# Patient Record
Sex: Female | Born: 1945 | Race: White | Hispanic: No | Marital: Married | Smoking: Never smoker
Health system: Southern US, Community
[De-identification: ages and names within clinical notes are randomized; demographics above are authoritative.]

## PROBLEM LIST (undated history)

## (undated) DIAGNOSIS — R002 Palpitations: Secondary | ICD-10-CM

## (undated) DIAGNOSIS — E559 Vitamin D deficiency, unspecified: Secondary | ICD-10-CM

## (undated) DIAGNOSIS — R32 Unspecified urinary incontinence: Secondary | ICD-10-CM

## (undated) DIAGNOSIS — I639 Cerebral infarction, unspecified: Secondary | ICD-10-CM

## (undated) DIAGNOSIS — M549 Dorsalgia, unspecified: Secondary | ICD-10-CM

## (undated) DIAGNOSIS — K760 Fatty (change of) liver, not elsewhere classified: Secondary | ICD-10-CM

## (undated) DIAGNOSIS — K5900 Constipation, unspecified: Secondary | ICD-10-CM

## (undated) DIAGNOSIS — G709 Myoneural disorder, unspecified: Secondary | ICD-10-CM

## (undated) DIAGNOSIS — R51 Headache: Secondary | ICD-10-CM

## (undated) DIAGNOSIS — F419 Anxiety disorder, unspecified: Secondary | ICD-10-CM

## (undated) DIAGNOSIS — M1990 Unspecified osteoarthritis, unspecified site: Secondary | ICD-10-CM

## (undated) DIAGNOSIS — C649 Malignant neoplasm of unspecified kidney, except renal pelvis: Secondary | ICD-10-CM

## (undated) DIAGNOSIS — K219 Gastro-esophageal reflux disease without esophagitis: Secondary | ICD-10-CM

## (undated) DIAGNOSIS — J189 Pneumonia, unspecified organism: Secondary | ICD-10-CM

## (undated) DIAGNOSIS — G459 Transient cerebral ischemic attack, unspecified: Secondary | ICD-10-CM

## (undated) DIAGNOSIS — Z5189 Encounter for other specified aftercare: Secondary | ICD-10-CM

## (undated) DIAGNOSIS — K589 Irritable bowel syndrome without diarrhea: Secondary | ICD-10-CM

## (undated) DIAGNOSIS — G4730 Sleep apnea, unspecified: Secondary | ICD-10-CM

## (undated) DIAGNOSIS — R011 Cardiac murmur, unspecified: Secondary | ICD-10-CM

## (undated) DIAGNOSIS — G2581 Restless legs syndrome: Secondary | ICD-10-CM

## (undated) DIAGNOSIS — IMO0002 Reserved for concepts with insufficient information to code with codable children: Secondary | ICD-10-CM

## (undated) DIAGNOSIS — K5790 Diverticulosis of intestine, part unspecified, without perforation or abscess without bleeding: Secondary | ICD-10-CM

## (undated) DIAGNOSIS — R945 Abnormal results of liver function studies: Secondary | ICD-10-CM

## (undated) DIAGNOSIS — R252 Cramp and spasm: Secondary | ICD-10-CM

## (undated) DIAGNOSIS — G43909 Migraine, unspecified, not intractable, without status migrainosus: Secondary | ICD-10-CM

## (undated) DIAGNOSIS — R7989 Other specified abnormal findings of blood chemistry: Secondary | ICD-10-CM

## (undated) DIAGNOSIS — I10 Essential (primary) hypertension: Secondary | ICD-10-CM

## (undated) DIAGNOSIS — I499 Cardiac arrhythmia, unspecified: Secondary | ICD-10-CM

## (undated) DIAGNOSIS — E669 Obesity, unspecified: Secondary | ICD-10-CM

## (undated) DIAGNOSIS — M2550 Pain in unspecified joint: Secondary | ICD-10-CM

## (undated) DIAGNOSIS — K635 Polyp of colon: Secondary | ICD-10-CM

## (undated) HISTORY — DX: Unspecified urinary incontinence: R32

## (undated) HISTORY — DX: Migraine, unspecified, not intractable, without status migrainosus: G43.909

## (undated) HISTORY — DX: Irritable bowel syndrome, unspecified: K58.9

## (undated) HISTORY — DX: Transient cerebral ischemic attack, unspecified: G45.9

## (undated) HISTORY — DX: Palpitations: R00.2

## (undated) HISTORY — DX: Cardiac murmur, unspecified: R01.1

## (undated) HISTORY — DX: Diverticulosis of intestine, part unspecified, without perforation or abscess without bleeding: K57.90

## (undated) HISTORY — DX: Myoneural disorder, unspecified: G70.9

## (undated) HISTORY — DX: Pain in unspecified joint: M25.50

## (undated) HISTORY — DX: Obesity, unspecified: E66.9

## (undated) HISTORY — DX: Unspecified osteoarthritis, unspecified site: M19.90

## (undated) HISTORY — DX: Gastro-esophageal reflux disease without esophagitis: K21.9

## (undated) HISTORY — DX: Cerebral infarction, unspecified: I63.9

## (undated) HISTORY — PX: ELBOW SURGERY: SHX618

## (undated) HISTORY — DX: Vitamin D deficiency, unspecified: E55.9

## (undated) HISTORY — DX: Cramp and spasm: R25.2

## (undated) HISTORY — DX: Constipation, unspecified: K59.00

## (undated) HISTORY — PX: TONSILLECTOMY: SHX5217

## (undated) HISTORY — DX: Reserved for concepts with insufficient information to code with codable children: IMO0002

## (undated) HISTORY — DX: Fatty (change of) liver, not elsewhere classified: K76.0

## (undated) HISTORY — DX: Dorsalgia, unspecified: M54.9

## (undated) HISTORY — DX: Encounter for other specified aftercare: Z51.89

## (undated) HISTORY — DX: Abnormal results of liver function studies: R94.5

## (undated) HISTORY — DX: Polyp of colon: K63.5

## (undated) HISTORY — DX: Other specified abnormal findings of blood chemistry: R79.89

## (undated) HISTORY — DX: Essential (primary) hypertension: I10

---

## 1977-07-08 HISTORY — PX: VAGINAL HYSTERECTOMY: SUR661

## 1989-07-08 HISTORY — PX: BREAST REDUCTION SURGERY: SHX8

## 1997-07-08 DIAGNOSIS — I499 Cardiac arrhythmia, unspecified: Secondary | ICD-10-CM

## 1997-07-08 HISTORY — DX: Cardiac arrhythmia, unspecified: I49.9

## 1998-01-26 ENCOUNTER — Ambulatory Visit (HOSPITAL_COMMUNITY): Admission: RE | Admit: 1998-01-26 | Discharge: 1998-01-26 | Payer: Self-pay | Admitting: Gastroenterology

## 1998-02-21 ENCOUNTER — Encounter (INDEPENDENT_AMBULATORY_CARE_PROVIDER_SITE_OTHER): Payer: Self-pay | Admitting: *Deleted

## 1998-02-21 ENCOUNTER — Other Ambulatory Visit: Admission: RE | Admit: 1998-02-21 | Discharge: 1998-02-21 | Payer: Self-pay | Admitting: Gastroenterology

## 1998-09-19 ENCOUNTER — Encounter: Admission: RE | Admit: 1998-09-19 | Discharge: 1998-12-18 | Payer: Self-pay | Admitting: Internal Medicine

## 1998-11-27 ENCOUNTER — Other Ambulatory Visit: Admission: RE | Admit: 1998-11-27 | Discharge: 1998-11-27 | Payer: Self-pay | Admitting: Obstetrics and Gynecology

## 2000-02-04 ENCOUNTER — Other Ambulatory Visit: Admission: RE | Admit: 2000-02-04 | Discharge: 2000-02-04 | Payer: Self-pay | Admitting: Internal Medicine

## 2001-10-26 ENCOUNTER — Encounter (INDEPENDENT_AMBULATORY_CARE_PROVIDER_SITE_OTHER): Payer: Self-pay | Admitting: *Deleted

## 2002-10-08 ENCOUNTER — Encounter (INDEPENDENT_AMBULATORY_CARE_PROVIDER_SITE_OTHER): Payer: Self-pay | Admitting: *Deleted

## 2002-10-26 ENCOUNTER — Encounter (INDEPENDENT_AMBULATORY_CARE_PROVIDER_SITE_OTHER): Payer: Self-pay | Admitting: Gastroenterology

## 2002-10-26 ENCOUNTER — Encounter (INDEPENDENT_AMBULATORY_CARE_PROVIDER_SITE_OTHER): Payer: Self-pay | Admitting: *Deleted

## 2002-10-26 ENCOUNTER — Encounter: Payer: Self-pay | Admitting: Internal Medicine

## 2003-01-20 ENCOUNTER — Encounter: Payer: Self-pay | Admitting: Family Medicine

## 2003-01-20 ENCOUNTER — Encounter: Admission: RE | Admit: 2003-01-20 | Discharge: 2003-01-20 | Payer: Self-pay | Admitting: Family Medicine

## 2003-03-25 ENCOUNTER — Encounter: Payer: Self-pay | Admitting: Internal Medicine

## 2003-03-25 ENCOUNTER — Encounter: Admission: RE | Admit: 2003-03-25 | Discharge: 2003-03-25 | Payer: Self-pay | Admitting: Internal Medicine

## 2003-05-23 ENCOUNTER — Encounter (INDEPENDENT_AMBULATORY_CARE_PROVIDER_SITE_OTHER): Payer: Self-pay | Admitting: *Deleted

## 2003-10-03 ENCOUNTER — Encounter (INDEPENDENT_AMBULATORY_CARE_PROVIDER_SITE_OTHER): Payer: Self-pay | Admitting: *Deleted

## 2004-04-24 ENCOUNTER — Ambulatory Visit: Payer: Self-pay | Admitting: Family Medicine

## 2004-05-18 ENCOUNTER — Ambulatory Visit: Payer: Self-pay | Admitting: Family Medicine

## 2004-07-06 ENCOUNTER — Other Ambulatory Visit: Admission: RE | Admit: 2004-07-06 | Discharge: 2004-07-06 | Payer: Self-pay | Admitting: Family Medicine

## 2004-07-06 ENCOUNTER — Ambulatory Visit: Payer: Self-pay | Admitting: Family Medicine

## 2004-07-23 ENCOUNTER — Ambulatory Visit: Payer: Self-pay | Admitting: Cardiovascular Disease

## 2004-08-15 ENCOUNTER — Ambulatory Visit: Payer: Self-pay | Admitting: Cardiovascular Disease

## 2004-10-31 ENCOUNTER — Encounter (INDEPENDENT_AMBULATORY_CARE_PROVIDER_SITE_OTHER): Payer: Self-pay | Admitting: *Deleted

## 2004-10-31 ENCOUNTER — Ambulatory Visit: Payer: Self-pay | Admitting: Gastroenterology

## 2005-02-06 ENCOUNTER — Ambulatory Visit: Payer: Self-pay | Admitting: Internal Medicine

## 2005-05-03 ENCOUNTER — Ambulatory Visit: Payer: Self-pay | Admitting: Internal Medicine

## 2005-05-09 ENCOUNTER — Ambulatory Visit: Payer: Self-pay | Admitting: Internal Medicine

## 2005-07-08 DIAGNOSIS — J189 Pneumonia, unspecified organism: Secondary | ICD-10-CM

## 2005-07-08 HISTORY — DX: Pneumonia, unspecified organism: J18.9

## 2005-08-28 ENCOUNTER — Ambulatory Visit: Payer: Self-pay | Admitting: Internal Medicine

## 2005-09-02 ENCOUNTER — Ambulatory Visit: Payer: Self-pay | Admitting: Internal Medicine

## 2005-09-03 ENCOUNTER — Emergency Department (HOSPITAL_COMMUNITY): Admission: EM | Admit: 2005-09-03 | Discharge: 2005-09-03 | Payer: Self-pay | Admitting: Emergency Medicine

## 2005-09-04 ENCOUNTER — Ambulatory Visit: Payer: Self-pay | Admitting: Internal Medicine

## 2005-09-04 ENCOUNTER — Inpatient Hospital Stay (HOSPITAL_COMMUNITY): Admission: AD | Admit: 2005-09-04 | Discharge: 2005-09-06 | Payer: Self-pay | Admitting: Internal Medicine

## 2005-09-10 ENCOUNTER — Ambulatory Visit: Payer: Self-pay | Admitting: Internal Medicine

## 2005-09-24 ENCOUNTER — Ambulatory Visit: Payer: Self-pay | Admitting: Internal Medicine

## 2005-09-25 ENCOUNTER — Encounter (INDEPENDENT_AMBULATORY_CARE_PROVIDER_SITE_OTHER): Payer: Self-pay | Admitting: *Deleted

## 2005-09-25 ENCOUNTER — Ambulatory Visit: Payer: Self-pay | Admitting: Gastroenterology

## 2005-10-18 ENCOUNTER — Ambulatory Visit: Payer: Self-pay | Admitting: Endocrinology

## 2005-12-25 ENCOUNTER — Ambulatory Visit: Payer: Self-pay | Admitting: Internal Medicine

## 2005-12-30 ENCOUNTER — Ambulatory Visit: Payer: Self-pay | Admitting: Cardiovascular Disease

## 2006-01-01 ENCOUNTER — Ambulatory Visit: Payer: Self-pay | Admitting: Internal Medicine

## 2006-04-25 ENCOUNTER — Ambulatory Visit: Payer: Self-pay | Admitting: Internal Medicine

## 2006-04-28 LAB — HM MAMMOGRAPHY: HM Mammogram: NORMAL

## 2006-06-18 ENCOUNTER — Ambulatory Visit: Payer: Self-pay | Admitting: Internal Medicine

## 2006-09-24 ENCOUNTER — Ambulatory Visit: Payer: Self-pay | Admitting: Gastroenterology

## 2006-10-16 ENCOUNTER — Ambulatory Visit (HOSPITAL_COMMUNITY): Admission: RE | Admit: 2006-10-16 | Discharge: 2006-10-16 | Payer: Self-pay | Admitting: Gastroenterology

## 2006-10-16 ENCOUNTER — Encounter (INDEPENDENT_AMBULATORY_CARE_PROVIDER_SITE_OTHER): Payer: Self-pay | Admitting: Gastroenterology

## 2006-10-16 LAB — HM COLONOSCOPY: HM Colonoscopy: NORMAL

## 2006-12-17 ENCOUNTER — Ambulatory Visit: Payer: Self-pay | Admitting: Internal Medicine

## 2006-12-24 ENCOUNTER — Other Ambulatory Visit: Admission: RE | Admit: 2006-12-24 | Discharge: 2006-12-24 | Payer: Self-pay | Admitting: Gynecology

## 2006-12-31 ENCOUNTER — Ambulatory Visit: Payer: Self-pay | Admitting: Gastroenterology

## 2007-01-28 ENCOUNTER — Ambulatory Visit: Payer: Self-pay | Admitting: Internal Medicine

## 2007-01-28 LAB — CONVERTED CEMR LAB
AST: 34 units/L (ref 0–37)
Bilirubin, Direct: 0.1 mg/dL (ref 0.0–0.3)
CO2: 30 meq/L (ref 19–32)
Chloride: 105 meq/L (ref 96–112)
Creatinine, Ser: 0.6 mg/dL (ref 0.4–1.2)
Eosinophils Absolute: 0.1 10*3/uL (ref 0.0–0.6)
Eosinophils Relative: 0.9 % (ref 0.0–5.0)
GFR calc non Af Amer: 109 mL/min
Glucose, Bld: 95 mg/dL (ref 70–99)
HCT: 34.1 % — ABNORMAL LOW (ref 36.0–46.0)
MCV: 86.9 fL (ref 78.0–100.0)
Neutrophils Relative %: 68.6 % (ref 43.0–77.0)
Pro B Natriuretic peptide (BNP): 62 pg/mL (ref 0.0–100.0)
RBC: 3.93 M/uL (ref 3.87–5.11)
Sodium: 142 meq/L (ref 135–145)
Total Bilirubin: 0.5 mg/dL (ref 0.3–1.2)
Total Protein: 6.4 g/dL (ref 6.0–8.3)
WBC: 7.6 10*3/uL (ref 4.5–10.5)

## 2007-01-29 ENCOUNTER — Ambulatory Visit: Payer: Self-pay | Admitting: Cardiology

## 2007-01-29 ENCOUNTER — Ambulatory Visit: Payer: Self-pay | Admitting: Critical Care Medicine

## 2007-02-02 DIAGNOSIS — N6009 Solitary cyst of unspecified breast: Secondary | ICD-10-CM

## 2007-02-02 DIAGNOSIS — I059 Rheumatic mitral valve disease, unspecified: Secondary | ICD-10-CM | POA: Insufficient documentation

## 2007-02-02 DIAGNOSIS — K589 Irritable bowel syndrome without diarrhea: Secondary | ICD-10-CM

## 2007-02-02 DIAGNOSIS — G2581 Restless legs syndrome: Secondary | ICD-10-CM | POA: Insufficient documentation

## 2007-02-02 DIAGNOSIS — K219 Gastro-esophageal reflux disease without esophagitis: Secondary | ICD-10-CM

## 2007-02-02 DIAGNOSIS — I10 Essential (primary) hypertension: Secondary | ICD-10-CM | POA: Insufficient documentation

## 2007-02-02 DIAGNOSIS — Z87898 Personal history of other specified conditions: Secondary | ICD-10-CM

## 2007-02-05 ENCOUNTER — Ambulatory Visit: Payer: Self-pay

## 2007-02-05 ENCOUNTER — Encounter: Payer: Self-pay | Admitting: Internal Medicine

## 2007-04-01 ENCOUNTER — Encounter: Payer: Self-pay | Admitting: Internal Medicine

## 2007-04-01 ENCOUNTER — Ambulatory Visit: Payer: Self-pay | Admitting: Internal Medicine

## 2007-04-01 DIAGNOSIS — Z9189 Other specified personal risk factors, not elsewhere classified: Secondary | ICD-10-CM | POA: Insufficient documentation

## 2007-04-01 DIAGNOSIS — J383 Other diseases of vocal cords: Secondary | ICD-10-CM | POA: Insufficient documentation

## 2007-04-01 DIAGNOSIS — E669 Obesity, unspecified: Secondary | ICD-10-CM | POA: Insufficient documentation

## 2007-06-29 ENCOUNTER — Telehealth (INDEPENDENT_AMBULATORY_CARE_PROVIDER_SITE_OTHER): Payer: Self-pay | Admitting: *Deleted

## 2007-07-01 ENCOUNTER — Ambulatory Visit: Payer: Self-pay | Admitting: Internal Medicine

## 2007-07-01 DIAGNOSIS — J209 Acute bronchitis, unspecified: Secondary | ICD-10-CM | POA: Insufficient documentation

## 2007-09-25 ENCOUNTER — Ambulatory Visit: Payer: Self-pay | Admitting: Internal Medicine

## 2007-09-28 ENCOUNTER — Ambulatory Visit: Payer: Self-pay | Admitting: Internal Medicine

## 2007-09-28 DIAGNOSIS — R252 Cramp and spasm: Secondary | ICD-10-CM | POA: Insufficient documentation

## 2007-10-12 ENCOUNTER — Telehealth: Payer: Self-pay | Admitting: Internal Medicine

## 2007-10-30 ENCOUNTER — Ambulatory Visit: Payer: Self-pay | Admitting: Internal Medicine

## 2007-11-28 ENCOUNTER — Emergency Department (HOSPITAL_COMMUNITY): Admission: EM | Admit: 2007-11-28 | Discharge: 2007-11-28 | Payer: Self-pay | Admitting: Emergency Medicine

## 2007-11-28 ENCOUNTER — Telehealth: Payer: Self-pay | Admitting: Family Medicine

## 2007-12-25 ENCOUNTER — Ambulatory Visit: Payer: Self-pay | Admitting: Internal Medicine

## 2007-12-26 LAB — CONVERTED CEMR LAB
BUN: 13 mg/dL (ref 6–23)
Calcium: 9.3 mg/dL (ref 8.4–10.5)
Creatinine, Ser: 0.7 mg/dL (ref 0.4–1.2)
GFR calc Af Amer: 110 mL/min
Glucose, Bld: 109 mg/dL — ABNORMAL HIGH (ref 70–99)

## 2008-01-01 ENCOUNTER — Ambulatory Visit: Payer: Self-pay | Admitting: Internal Medicine

## 2008-01-01 DIAGNOSIS — R21 Rash and other nonspecific skin eruption: Secondary | ICD-10-CM

## 2008-01-01 DIAGNOSIS — R079 Chest pain, unspecified: Secondary | ICD-10-CM | POA: Insufficient documentation

## 2008-01-15 ENCOUNTER — Telehealth: Payer: Self-pay | Admitting: Internal Medicine

## 2008-04-22 ENCOUNTER — Ambulatory Visit: Payer: Self-pay | Admitting: Internal Medicine

## 2008-04-25 ENCOUNTER — Ambulatory Visit: Payer: Self-pay | Admitting: Internal Medicine

## 2008-04-28 ENCOUNTER — Ambulatory Visit: Payer: Self-pay | Admitting: Critical Care Medicine

## 2008-04-28 DIAGNOSIS — J984 Other disorders of lung: Secondary | ICD-10-CM | POA: Insufficient documentation

## 2008-04-28 DIAGNOSIS — J309 Allergic rhinitis, unspecified: Secondary | ICD-10-CM | POA: Insufficient documentation

## 2008-07-04 ENCOUNTER — Ambulatory Visit: Payer: Self-pay | Admitting: Internal Medicine

## 2008-07-07 LAB — CONVERTED CEMR LAB
ALT: 32 units/L (ref 0–35)
BUN: 14 mg/dL (ref 6–23)
Basophils Absolute: 0 10*3/uL (ref 0.0–0.1)
Bilirubin, Direct: 0.1 mg/dL (ref 0.0–0.3)
Calcium: 9.1 mg/dL (ref 8.4–10.5)
Chloride: 107 meq/L (ref 96–112)
Eosinophils Absolute: 0.1 10*3/uL (ref 0.0–0.7)
Glucose, Bld: 112 mg/dL — ABNORMAL HIGH (ref 70–99)
HCT: 37.7 % (ref 36.0–46.0)
MCHC: 34.9 g/dL (ref 30.0–36.0)
MCV: 91.8 fL (ref 78.0–100.0)
Monocytes Absolute: 0.3 10*3/uL (ref 0.1–1.0)
Neutrophils Relative %: 68.7 % (ref 43.0–77.0)
Platelets: 207 10*3/uL (ref 150–400)
Potassium: 4.3 meq/L (ref 3.5–5.1)
Total Protein: 6.4 g/dL (ref 6.0–8.3)

## 2008-07-12 ENCOUNTER — Ambulatory Visit: Payer: Self-pay | Admitting: Internal Medicine

## 2008-07-22 ENCOUNTER — Telehealth: Payer: Self-pay | Admitting: Internal Medicine

## 2008-07-27 ENCOUNTER — Telehealth: Payer: Self-pay | Admitting: Internal Medicine

## 2008-09-20 DIAGNOSIS — D126 Benign neoplasm of colon, unspecified: Secondary | ICD-10-CM | POA: Insufficient documentation

## 2008-09-21 ENCOUNTER — Ambulatory Visit: Payer: Self-pay | Admitting: Internal Medicine

## 2008-09-21 DIAGNOSIS — Z860100 Personal history of colon polyps, unspecified: Secondary | ICD-10-CM | POA: Insufficient documentation

## 2008-10-05 ENCOUNTER — Telehealth: Payer: Self-pay | Admitting: Internal Medicine

## 2008-10-13 ENCOUNTER — Encounter: Payer: Self-pay | Admitting: Internal Medicine

## 2008-10-27 ENCOUNTER — Ambulatory Visit: Payer: Self-pay | Admitting: Internal Medicine

## 2008-10-27 ENCOUNTER — Telehealth: Payer: Self-pay | Admitting: Internal Medicine

## 2008-10-28 ENCOUNTER — Telehealth: Payer: Self-pay | Admitting: Adult Health

## 2008-11-09 ENCOUNTER — Encounter: Payer: Self-pay | Admitting: Internal Medicine

## 2008-11-23 ENCOUNTER — Encounter: Payer: Self-pay | Admitting: Internal Medicine

## 2009-01-10 ENCOUNTER — Telehealth: Payer: Self-pay | Admitting: Internal Medicine

## 2009-01-13 ENCOUNTER — Ambulatory Visit: Payer: Self-pay | Admitting: Internal Medicine

## 2009-01-13 LAB — CONVERTED CEMR LAB
BUN: 17 mg/dL (ref 6–23)
Calcium: 9.1 mg/dL (ref 8.4–10.5)
GFR calc non Af Amer: 90.41 mL/min (ref >60)
Glucose, Bld: 111 mg/dL — ABNORMAL HIGH (ref 70–99)
Sodium: 141 meq/L (ref 135–145)

## 2009-01-16 ENCOUNTER — Ambulatory Visit: Payer: Self-pay | Admitting: Internal Medicine

## 2009-01-16 DIAGNOSIS — R5383 Other fatigue: Secondary | ICD-10-CM

## 2009-01-18 ENCOUNTER — Encounter: Payer: Self-pay | Admitting: Internal Medicine

## 2009-01-23 ENCOUNTER — Encounter: Payer: Self-pay | Admitting: Internal Medicine

## 2009-03-02 ENCOUNTER — Encounter: Payer: Self-pay | Admitting: Internal Medicine

## 2009-05-12 ENCOUNTER — Ambulatory Visit: Payer: Self-pay | Admitting: Internal Medicine

## 2009-05-12 LAB — CONVERTED CEMR LAB
BUN: 12 mg/dL (ref 6–23)
Basophils Absolute: 0.1 10*3/uL (ref 0.0–0.1)
Bilirubin Urine: NEGATIVE
Cholesterol: 180 mg/dL (ref 0–200)
Creatinine, Ser: 0.8 mg/dL (ref 0.4–1.2)
Eosinophils Absolute: 0.1 10*3/uL (ref 0.0–0.7)
GFR calc non Af Amer: 77.41 mL/min (ref >60)
Ketones, ur: NEGATIVE mg/dL
LDL Cholesterol: 89 mg/dL (ref 0–99)
Leukocytes, UA: NEGATIVE
Lymphocytes Relative: 22.2 % (ref 12.0–46.0)
MCHC: 34.3 g/dL (ref 30.0–36.0)
Neutro Abs: 3.5 10*3/uL (ref 1.4–7.7)
Neutrophils Relative %: 68.4 % (ref 43.0–77.0)
RDW: 12 % (ref 11.5–14.6)
Sed Rate: 21 mm/hr (ref 0–22)
Total Bilirubin: 0.7 mg/dL (ref 0.3–1.2)
Triglycerides: 124 mg/dL (ref 0.0–149.0)
VLDL: 24.8 mg/dL (ref 0.0–40.0)
Vitamin B-12: 602 pg/mL (ref 211–911)
pH: 6 (ref 5.0–8.0)

## 2009-05-17 ENCOUNTER — Ambulatory Visit: Payer: Self-pay | Admitting: Internal Medicine

## 2009-05-17 DIAGNOSIS — R05 Cough: Secondary | ICD-10-CM

## 2009-08-18 ENCOUNTER — Ambulatory Visit: Payer: Self-pay | Admitting: Internal Medicine

## 2009-08-18 LAB — CONVERTED CEMR LAB
ALT: 64 units/L — ABNORMAL HIGH (ref 0–35)
Albumin: 4 g/dL (ref 3.5–5.2)
BUN: 15 mg/dL (ref 6–23)
Chloride: 104 meq/L (ref 96–112)
Creatinine, Ser: 0.8 mg/dL (ref 0.4–1.2)
Glucose, Bld: 104 mg/dL — ABNORMAL HIGH (ref 70–99)
Total Bilirubin: 0.5 mg/dL (ref 0.3–1.2)
Total Protein: 6.8 g/dL (ref 6.0–8.3)

## 2009-08-23 ENCOUNTER — Ambulatory Visit: Payer: Self-pay | Admitting: Internal Medicine

## 2009-08-23 DIAGNOSIS — R7309 Other abnormal glucose: Secondary | ICD-10-CM | POA: Insufficient documentation

## 2009-08-23 DIAGNOSIS — Z8639 Personal history of other endocrine, nutritional and metabolic disease: Secondary | ICD-10-CM

## 2009-08-23 DIAGNOSIS — Z862 Personal history of diseases of the blood and blood-forming organs and certain disorders involving the immune mechanism: Secondary | ICD-10-CM | POA: Insufficient documentation

## 2009-08-25 ENCOUNTER — Telehealth: Payer: Self-pay | Admitting: Internal Medicine

## 2009-10-04 ENCOUNTER — Ambulatory Visit: Payer: Self-pay | Admitting: Internal Medicine

## 2009-10-04 ENCOUNTER — Telehealth: Payer: Self-pay | Admitting: Internal Medicine

## 2009-10-16 ENCOUNTER — Encounter: Payer: Self-pay | Admitting: Internal Medicine

## 2009-10-19 ENCOUNTER — Telehealth: Payer: Self-pay | Admitting: Internal Medicine

## 2009-10-25 ENCOUNTER — Encounter: Payer: Self-pay | Admitting: Internal Medicine

## 2010-01-18 ENCOUNTER — Ambulatory Visit: Payer: Self-pay | Admitting: Internal Medicine

## 2010-01-18 DIAGNOSIS — R42 Dizziness and giddiness: Secondary | ICD-10-CM | POA: Insufficient documentation

## 2010-01-18 DIAGNOSIS — J069 Acute upper respiratory infection, unspecified: Secondary | ICD-10-CM | POA: Insufficient documentation

## 2010-02-15 ENCOUNTER — Ambulatory Visit: Payer: Self-pay | Admitting: Internal Medicine

## 2010-02-15 LAB — CONVERTED CEMR LAB
ALT: 71 units/L — ABNORMAL HIGH (ref 0–35)
BUN: 12 mg/dL (ref 6–23)
Bilirubin, Direct: 0.2 mg/dL (ref 0.0–0.3)
CO2: 31 meq/L (ref 19–32)
Chloride: 103 meq/L (ref 96–112)
Creatinine, Ser: 0.8 mg/dL (ref 0.4–1.2)
Total Protein: 7 g/dL (ref 6.0–8.3)

## 2010-02-21 ENCOUNTER — Ambulatory Visit: Payer: Self-pay | Admitting: Internal Medicine

## 2010-02-23 ENCOUNTER — Encounter: Admission: RE | Admit: 2010-02-23 | Discharge: 2010-02-23 | Payer: Self-pay | Admitting: Internal Medicine

## 2010-05-15 ENCOUNTER — Encounter: Payer: Self-pay | Admitting: Internal Medicine

## 2010-07-30 ENCOUNTER — Telehealth: Payer: Self-pay | Admitting: Internal Medicine

## 2010-08-07 NOTE — Medication Information (Signed)
Summary: Lansoprazole/[facility]  Lansoprazole/[facility]   Imported By: Phillis Knack 10/30/2009 07:55:15  _____________________________________________________________________  External Attachment:    Type:   Image     Comment:   External Document

## 2010-08-07 NOTE — Assessment & Plan Note (Signed)
Summary: VERTIGO/[facility]   Vital Signs:  Patient profile:   65 year old female Height:      64 inches Weight:      218 pounds BMI:     37.55 O2 Sat:      96 % on Room air Temp:     97.7 degrees F oral Pulse rate:   65 / minute BP sitting:   112 / 80  (left arm) Cuff size:   large  Vitals Entered By: Shirlean Mylar Ewing CMA Deborra Medina) (January 18, 2010 1:49 PM)  O2 Flow:  Room air CC: Vertigo/RE   Primary Care Provider:  Walker Kehr, MD  CC:  Vertigo/RE.  History of Present Illness: here after URI symtpoms begiinning the end of last wk, somewhat better with antihistamine,  but over the past weekend (3 days ago) with onset positional dizziness, and even fell over a chair without injury;  the last 2 to 3 days with persistent headache, ear fullness, and vertigo without nausea, though has less appetitie,  no fever, wt loss, but has had some coldness today and feels slike se is "freezing."  Has had some sinus drainage , no ocugh, but has dry throat with the antihistamines.  Has had childhood singifcant  sinus allergies, but symptoms not nearly as severe in recent years - maybe a day or two a year when the pollen is worse.  She had the grandkids for several days at the onset of her symptoms;  Pt denies CP, worsening sob, doe, wheezing, orthopnea, pnd, worsening LE edema, palps, dizziness or syncope  Pt denies new neuro symptoms such as headache, facial or extremity weakness   Has hx of asthma with DOE with going up and down stairs only.  No fever, wt loss, night sweats, or other constitutional symptoms No signficant falls or other injury.    Problems Prior to Update: 1)  Liver Function Tests, Abnormal, Hx of  (ICD-V12.2) 2)  Hyperglycemia  (ICD-790.29) 3)  Cough  (ICD-786.2) 4)  Fatigue  (ICD-780.79) 5)  Personal Hx Colonic Polyps  (ICD-V12.72) 6)  Adenomatous Colonic Polyp  (ICD-211.3) 7)  Restrictive Lung Disease  (ICD-518.89) 8)  Allergic Rhinitis  (ICD-477.9) 9)  Rash and Other Nonspecific Skin  Eruption  (ICD-782.1) 10)  Chest Pain, Unspecified  (ICD-786.50) 11)  Sinusitis, Acute  (ICD-461.9) 12)  Cramps,leg  (ICD-729.82) 13)  Bronchitis, Acute  (ICD-466.0) 14)  Hx of Reduction Mammoplasty, Hx of  (ICD-V15.9) 15)  Irritable Bowel Syndrome  (ICD-564.1) 16)  Obesity  (ICD-278.00) 17)  Vocal Cord Disorder  (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 Two Times A Day 2)  Librax 2.5-5 Mg  Caps (Clidinium-Chlordiazepoxide) .... Take 1 Tablet By Mouth As Needed Bid 3)  Clonazepam 1 Mg  Tabs (Clonazepam) .... Take 1-2 Tablets By Mouth At Bedtime 4)  Skelaxin 800 Mg  Tabs (Metaxalone) .Marland Kitchen.. 1 By Mouth Two Times A Day 5)  Toprol Xl 100 Mg  Tb24 (Metoprolol Succinate) .... Take 1 Tablet By Mouth Once A Day 6)  Benicar 40 Mg  Tabs (Olmesartan Medoxomil) .Marland Kitchen.. 1 By Mouth Once Daily 7)  Premarin 0.625 Mg Tabs (Estrogens Conjugated) .... One By Mouth Daily/alternates With 0.3 8)  Spironolactone 50 Mg  Tabs (Spironolactone) .... Take 1 Tablet By Mouth Once A Day 9)  Vitamin D3 1000 Unit  Tabs (Cholecalciferol) .Marland Kitchen.. 1 By Mouth Daily 10)  Proair Hfa 108 (90 Base) Mcg/act  Aers (Albuterol Sulfate) .... 2 Inh Q4h As Needed Shortness of Breath 11)  Premarin 0.3 Mg Tabs (Estrogens Conjugated) .Marland Kitchen.. 1 By Mouth Once Daily/alternates With The 0.625  Current Medications (verified): 1)  Prevacid 30 Mg  Cpdr (Lansoprazole) .... Two Times A Day 2)  Librax 2.5-5 Mg  Caps (Clidinium-Chlordiazepoxide) .... Take 1 Tablet By Mouth As Needed Bid 3)  Clonazepam 1 Mg  Tabs (Clonazepam) .... Take 1-2 Tablets By Mouth At Bedtime 4)  Skelaxin 800 Mg  Tabs (Metaxalone) .Marland Kitchen.. 1 By Mouth Two Times A Day 5)  Toprol Xl 100 Mg  Tb24 (Metoprolol Succinate) ....  Take 1 Tablet By Mouth Once A Day 6)  Benicar 40 Mg  Tabs (Olmesartan Medoxomil) .Marland Kitchen.. 1 By Mouth Once Daily 7)  Premarin 0.625 Mg Tabs (Estrogens Conjugated) .... One By Mouth Daily/alternates With 0.3 8)  Spironolactone 50 Mg  Tabs (Spironolactone) .... Take 1 Tablet By Mouth Once A Day 9)  Vitamin D3 1000 Unit  Tabs (Cholecalciferol) .Marland Kitchen.. 1 By Mouth Daily 10)  Proair Hfa 108 (90 Base) Mcg/act  Aers (Albuterol Sulfate) .... 2 Inh Q4h As Needed Shortness of Breath 11)  Premarin 0.3 Mg Tabs (Estrogens Conjugated) .Marland Kitchen.. 1 By Mouth Once Daily/alternates With The 0.625  Allergies (verified): 1)  ! Avelox Abc Pack (Moxifloxacin Hcl) 2)  Asa 3)  Diflucan (Fluconazole) (Fluconazole) 4)  Hydrocodone  Past History:  Past Surgical History: Last updated: 09/21/2008 Hysterectomy Tonsillectomy Breast Reduction  Social History: Last updated: 10/04/2009 Married Patient is a former smoker. -stopped 40 years ago Alcohol Use - yes-2 glasses occasionally Illicit Drug Use - no Patient does not get regular exercise.   Risk Factors: Exercise: no (09/21/2008)  Risk Factors: Smoking Status: never (08/23/2009)  Past Medical History: Asthma GERD Hypertension Obesity Allergic rhinitis  Review of Systems       all otherwise negative per pt -    Physical Exam  General:  alert and overweight-appearing.  , mild ill  Head:  normocephalic and atraumatic.   Eyes:  vision grossly intact, pupils equal, and pupils round.   Ears:  R ear normal.  , left tm with mod erythema, midl bulging Nose:  nasal dischargemucosal pallor and mucosal edema.   Mouth:  pharyngeal erythema and fair dentition.   Neck:  supple and no masses.   Lungs:  normal respiratory effort and normal breath sounds.   Heart:  normal rate and regular rhythm.   Extremities:  no edema, no erythema  Neurologic:  cranial nerves II-XII intact, strength normal in all extremities, and finger-to-nose normal.   Skin:  no rashes.   Psych:   slightly anxious.     Impression & Recommendations:  Problem # 1:  URI (ICD-465.9)  with left otitits media - ok for zpack x 1  Her updated medication list for this problem includes:    Levocetirizine Dihydrochloride 5 Mg Tabs (Levocetirizine dihydrochloride) .Marland Kitchen... 1po once daily  Problem # 2:  ALLERGIC RHINITIS (ICD-477.9)  has antihist and steroid nasal spray at home but not sure of names - will provide generic xyzal and flonase just in case  Her updated medication list for this problem includes:    Levocetirizine Dihydrochloride 5 Mg Tabs (Levocetirizine dihydrochloride) .Marland Kitchen... 1po once daily    Fluticasone Propionate 50 Mcg/act Susp (Fluticasone propionate) .Marland KitchenMarland KitchenMarland KitchenMarland Kitchen  2 spray/side once daily  Problem # 3:  INTERMITTENT VERTIGO (ICD-780.4) Assessment: New  most likely due to #1 - ok for meclizine as needed   Her updated medication list for this problem includes:    Levocetirizine Dihydrochloride 5 Mg Tabs (Levocetirizine dihydrochloride) .Marland Kitchen... 1po once daily    Meclizine Hcl 12.5 Mg Tabs (Meclizine hcl) .Marland Kitchen... 1-2 by mouth q 6hrs as needed dizziness  Problem # 4:  HYPERTENSION (ICD-401.9)  Her updated medication list for this problem includes:    Toprol Xl 100 Mg Tb24 (Metoprolol succinate) .Marland Kitchen... Take 1 tablet by mouth once a day    Benicar 40 Mg Tabs (Olmesartan medoxomil) .Marland Kitchen... 1 by mouth once daily    Spironolactone 50 Mg Tabs (Spironolactone) .Marland Kitchen... Take 1 tablet by mouth once a day  BP today: 112/80 Prior BP: 104/70 (10/04/2009)  Labs Reviewed: K+: 4.4 (08/18/2009) Creat: : 0.8 (08/18/2009)   Chol: 180 (05/12/2009)   HDL: 66.10 (05/12/2009)   LDL: 89 (05/12/2009)   TG: 124.0 (05/12/2009) stable overall by hx and exam, ok to continue meds/tx as is   Complete Medication List: 1)  Prevacid 30 Mg Cpdr (Lansoprazole) .... Two times a day 2)  Librax 2.5-5 Mg Caps (Clidinium-chlordiazepoxide) .... Take 1 tablet by mouth as needed bid 3)  Clonazepam 1 Mg Tabs (Clonazepam) ....  Take 1-2 tablets by mouth at bedtime 4)  Skelaxin 800 Mg Tabs (Metaxalone) .Marland Kitchen.. 1 by mouth two times a day 5)  Toprol Xl 100 Mg Tb24 (Metoprolol succinate) .... Take 1 tablet by mouth once a day 6)  Benicar 40 Mg Tabs (Olmesartan medoxomil) .Marland Kitchen.. 1 by mouth once daily 7)  Premarin 0.625 Mg Tabs (Estrogens conjugated) .... One by mouth daily/alternates with 0.3 8)  Spironolactone 50 Mg Tabs (Spironolactone) .... Take 1 tablet by mouth once a day 9)  Vitamin D3 1000 Unit Tabs (Cholecalciferol) .Marland Kitchen.. 1 by mouth daily 10)  Proair Hfa 108 (90 Base) Mcg/act Aers (Albuterol sulfate) .... 2 inh q4h as needed shortness of breath 11)  Premarin 0.3 Mg Tabs (Estrogens conjugated) .Marland Kitchen.. 1 by mouth once daily/alternates with the 0.625 12)  Azithromycin 250 Mg Tabs (Azithromycin) .... 2po qd for 1 day, then 1po qd for 4days, then stop 13)  Levocetirizine Dihydrochloride 5 Mg Tabs (Levocetirizine dihydrochloride) .Marland Kitchen.. 1po once daily 14)  Fluticasone Propionate 50 Mcg/act Susp (Fluticasone propionate) .... 2 spray/side once daily 15)  Meclizine Hcl 12.5 Mg Tabs (Meclizine hcl) .Marland Kitchen.. 1-2 by mouth q 6hrs as needed dizziness  Patient Instructions: 1)  Please take all new medications as prescribed - the antibiotic (azithromycin) and the meclizine as needed dizziness 2)  you are given the prescription for generic xzyal (antihist) and flonase (lfuticasone) in case you don't have these at home 3)  You can also use Mucinex OTC or it's generic for congestion 4)  Continue all previous medications as before this visit  5)  Please schedule an appointment with your primary doctor as needed Prescriptions: MECLIZINE HCL 12.5 MG TABS (MECLIZINE HCL) 1-2 by mouth q 6hrs as needed dizziness  #40 x 1   Entered and Authorized by:   Biagio Borg MD   Signed by:   Biagio Borg MD on 01/18/2010   Method used:   Print then Give to Patient   RxID:   [id number] FLUTICASONE PROPIONATE 50 MCG/ACT SUSP (FLUTICASONE PROPIONATE) 2  spray/side once daily  #1 x 11   Entered and Authorized by:   Biagio Borg MD   Signed  by:   Biagio Borg MD on 01/18/2010   Method used:   Print then Give to Patient   RxID:   [id number] LEVOCETIRIZINE DIHYDROCHLORIDE 5 MG TABS (LEVOCETIRIZINE DIHYDROCHLORIDE) 1po once daily  #30 x 11   Entered and Authorized by:   Biagio Borg MD   Signed by:   Biagio Borg MD on 01/18/2010   Method used:   Print then Give to Patient   RxID:   [id number] AZITHROMYCIN 250 MG TABS (AZITHROMYCIN) 2po qd for 1 day, then 1po qd for 4days, then stop  #6 x 1   Entered and Authorized by:   Biagio Borg MD   Signed by:   Biagio Borg MD on 01/18/2010   Method used:   Print then Give to Patient   RxID:   [id number]

## 2010-08-07 NOTE — Assessment & Plan Note (Signed)
Summary: 6 mth fu  stc   Vital Signs:  Patient profile:   65 year old female Height:      64 inches Weight:      221 pounds BMI:     38.07 O2 Sat:      95 % on Room air Temp:     98.5 degrees F oral Pulse rate:   75 / minute Pulse rhythm:   regular Resp:     16 per minute BP sitting:   120 / 86  (left arm) Cuff size:   large  Vitals Entered By: Jonathon Resides, CMA(AAMA) (February 21, 2010 1:23 PM)  O2 Flow:  Room air CC: 6 mo f/u Is Patient Diabetic? No Comments pt is not taking Levocetrizine Dihydro.  Please remove from list   Primary Care Provider:  Walker Kehr, MD  CC:  6 mo f/u.  History of Present Illness: C/o dizziness x 6 wks off and on F/u HTN, GERD, allergies  Current Medications (verified): 1)  Prevacid 30 Mg  Cpdr (Lansoprazole) .... Two Times A Day 2)  Librax 2.5-5 Mg  Caps (Clidinium-Chlordiazepoxide) .... Take 1 Tablet By Mouth As Needed Bid 3)  Clonazepam 1 Mg  Tabs (Clonazepam) .... Take 1-2 Tablets By Mouth At Bedtime 4)  Skelaxin 800 Mg  Tabs (Metaxalone) .Marland Kitchen.. 1 By Mouth Two Times A Day 5)  Toprol Xl 100 Mg  Tb24 (Metoprolol Succinate) .... Take 1 Tablet By Mouth Once A Day 6)  Benicar 40 Mg  Tabs (Olmesartan Medoxomil) .Marland Kitchen.. 1 By Mouth Once Daily 7)  Premarin 0.625 Mg Tabs (Estrogens Conjugated) .... One By Mouth Daily/alternates With 0.3 8)  Spironolactone 50 Mg  Tabs (Spironolactone) .... Take 1 Tablet By Mouth Once A Day 9)  Vitamin D3 1000 Unit  Tabs (Cholecalciferol) .Marland Kitchen.. 1 By Mouth Daily 10)  Proair Hfa 108 (90 Base) Mcg/act  Aers (Albuterol Sulfate) .... 2 Inh Q4h As Needed Shortness of Breath 11)  Premarin 0.3 Mg Tabs (Estrogens Conjugated) .Marland Kitchen.. 1 By Mouth Once Daily/alternates With The 0.625 12)  Levocetirizine Dihydrochloride 5 Mg Tabs (Levocetirizine Dihydrochloride) .Marland Kitchen.. 1po Once Daily 13)  Fluticasone Propionate 50 Mcg/act Susp (Fluticasone Propionate) .... 2 Spray/side Once Daily 14)  Meclizine Hcl 12.5 Mg Tabs (Meclizine Hcl) .Marland Kitchen.. 1-2  By Mouth Q 6hrs As Needed Dizziness  Allergies (verified): 1)  ! Avelox Abc Pack (Moxifloxacin Hcl) 2)  Asa 3)  Diflucan (Fluconazole) (Fluconazole) 4)  Hydrocodone  Past History:  Past Medical History: Last updated: 01/18/2010 Asthma GERD Hypertension Obesity Allergic rhinitis  Social History: Last updated: 10/04/2009 Married Patient is a former smoker. -stopped 40 years ago Alcohol Use - yes-2 glasses occasionally Illicit Drug Use - no Patient does not get regular exercise.   Review of Systems       The patient complains of weight gain.  The patient denies chest pain, syncope, and dyspnea on exertion.         dizzy  Physical Exam  General:  alert and overweight-appearing NAD Head:  normocephalic and atraumatic.   Ears:  B WNL Nose:  nasal dischargemucosal pallor and mucosal edema.   Mouth:  pharyngeal erythema and fair dentition.   Neck:  supple and no masses.   Lungs:  normal respiratory effort and normal breath sounds.   Heart:  normal rate and regular rhythm.   Abdomen:  Obese but Soft, nontender and nondistended. No masses, hepatosplenomegaly or hernias noted. Normal bowel sounds. Msk:  No deformity or scoliosis noted of thoracic or  lumbar spine.   Neurologic:  H-P (+) on R Skin:  no rashes.   Psych:  slightly anxious.     Impression & Recommendations:  Problem # 1:  DIZZINESS (ICD-780.4) BPV Laruth Bouchard - Daroff exercise was given to the patient  Her updated medication list for this problem includes:    Levocetirizine Dihydrochloride 5 Mg Tabs (Levocetirizine dihydrochloride) .Marland Kitchen... 1po once daily    Meclizine Hcl 12.5 Mg Tabs (Meclizine hcl) .Marland Kitchen... 1-2 by mouth q 6hrs as needed dizziness  Problem # 2:  LIVER FUNCTION TESTS, ABNORMAL, HX OF (ICD-V12.2) Assessment: Deteriorated  Orders: Radiology Referral (Radiology)  Problem # 3:  HYPERTENSION (ICD-401.9) Assessment: Unchanged  Her updated medication list for this problem includes:    Toprol Xl 100  Mg Tb24 (Metoprolol succinate) .Marland Kitchen... Take 1 tablet by mouth once a day    Benicar 40 Mg Tabs (Olmesartan medoxomil) .Marland Kitchen... 1 by mouth once daily    Spironolactone 50 Mg Tabs (Spironolactone) .Marland Kitchen... Take 1 tablet by mouth once a day  Problem # 4:  GERD (ICD-530.81) Assessment: Unchanged  Her updated medication list for this problem includes:    Prevacid 30 Mg Cpdr (Lansoprazole) .Marland Kitchen..Marland Kitchen Two times a day    Librax 2.5-5 Mg Caps (Clidinium-chlordiazepoxide) .Marland Kitchen... Take 1 tablet by mouth as needed bid  Complete Medication List: 1)  Prevacid 30 Mg Cpdr (Lansoprazole) .... Two times a day 2)  Librax 2.5-5 Mg Caps (Clidinium-chlordiazepoxide) .... Take 1 tablet by mouth as needed bid 3)  Clonazepam 1 Mg Tabs (Clonazepam) .... Take 1-2 tablets by mouth at bedtime 4)  Skelaxin 800 Mg Tabs (Metaxalone) .Marland Kitchen.. 1 by mouth two times a day 5)  Toprol Xl 100 Mg Tb24 (Metoprolol succinate) .... Take 1 tablet by mouth once a day 6)  Benicar 40 Mg Tabs (Olmesartan medoxomil) .Marland Kitchen.. 1 by mouth once daily 7)  Premarin 0.625 Mg Tabs (Estrogens conjugated) .... One by mouth daily/alternates with 0.3 8)  Spironolactone 50 Mg Tabs (Spironolactone) .... Take 1 tablet by mouth once a day 9)  Vitamin D3 1000 Unit Tabs (Cholecalciferol) .Marland Kitchen.. 1 by mouth daily 10)  Proair Hfa 108 (90 Base) Mcg/act Aers (Albuterol sulfate) .... 2 inh q4h as needed shortness of breath 11)  Premarin 0.3 Mg Tabs (Estrogens conjugated) .Marland Kitchen.. 1 by mouth once daily/alternates with the 0.625 12)  Levocetirizine Dihydrochloride 5 Mg Tabs (Levocetirizine dihydrochloride) .Marland Kitchen.. 1po once daily 13)  Fluticasone Propionate 50 Mcg/act Susp (Fluticasone propionate) .... 2 spray/side once daily 14)  Meclizine Hcl 12.5 Mg Tabs (Meclizine hcl) .Marland Kitchen.. 1-2 by mouth q 6hrs as needed dizziness  Other Orders: Tdap => 31yr IM (([id number] Admin 1st Vaccine (([id number]  Patient Instructions: 1)  Please schedule a follow-up appointment in 6 months well  w/labs. Prescriptions: FLUTICASONE PROPIONATE 50 MCG/ACT SUSP (FLUTICASONE PROPIONATE) 2 spray/side once daily  #1 x 11   Entered and Authorized by:   ACassandria AngerMD   Signed by:   ACassandria AngerMD on 02/21/2010   Method used:   Print then Give to Patient   RxID:   [id number] HCL 12.5 MG TABS (MECLIZINE HCL) 1-2 by mouth q 6hrs as needed dizziness  #40 x 1   Entered and Authorized by:   ACassandria AngerMD   Signed by:   ACassandria AngerMD on 02/21/2010   Method used:   Print then Give to Patient   RxID:   [id number] 0.3 MG TABS (ESTROGENS CONJUGATED) 1 by mouth once daily/alternates with  the 0.625  #90 x 1   Entered and Authorized by:   Cassandria Anger MD   Signed by:   Cassandria Anger MD on 02/21/2010   Method used:   Print then Give to Patient   RxID:   [id number] PROAIR HFA 108 (90 BASE) MCG/ACT  AERS (ALBUTEROL SULFATE) 2 inh q4h as needed shortness of breath  #1 x 6   Entered and Authorized by:   Cassandria Anger MD   Signed by:   Cassandria Anger MD on 02/21/2010   Method used:   Print then Give to Patient   RxID:   [id number] VITAMIN D3 1000 UNIT  TABS (CHOLECALCIFEROL) 1 by mouth daily  #0 x 0   Entered and Authorized by:   Cassandria Anger MD   Signed by:   Cassandria Anger MD on 02/21/2010   Method used:   Print then Give to Patient   RxID:   [id number] SPIRONOLACTONE 50 MG  TABS (SPIRONOLACTONE) Take 1 tablet by mouth once a day  #90 x 3   Entered and Authorized by:   Cassandria Anger MD   Signed by:   Cassandria Anger MD on 02/21/2010   Method used:   Print then Give to Patient   RxID:   [id number] PREMARIN 0.625 MG TABS (ESTROGENS CONJUGATED) one by mouth daily/alternates with 0.3  #90 x 1   Entered and Authorized by:   Cassandria Anger MD   Signed by:   Cassandria Anger MD on 02/21/2010   Method used:   Print then Give to Patient   RxID:    [id number] BENICAR 40 MG  TABS (OLMESARTAN MEDOXOMIL) 1 by mouth once daily  #90 x 3   Entered and Authorized by:   Cassandria Anger MD   Signed by:   Cassandria Anger MD on 02/21/2010   Method used:   Print then Give to Patient   RxID:   [id number] TOPROL XL 100 MG  TB24 (METOPROLOL SUCCINATE) Take 1 tablet by mouth once a day  #90 x 3   Entered and Authorized by:   Cassandria Anger MD   Signed by:   Cassandria Anger MD on 02/21/2010   Method used:   Print then Give to Patient   RxID:   [id number] SKELAXIN 800 MG  TABS (METAXALONE) 1 by mouth two times a day  #180 x 3   Entered and Authorized by:   Cassandria Anger MD   Signed by:   Cassandria Anger MD on 02/21/2010   Method used:   Print then Give to Patient   RxID:   [id number] CLONAZEPAM 1 MG  TABS (CLONAZEPAM) Take 1-2 tablets by mouth at bedtime  #60 x 6   Entered and Authorized by:   Cassandria Anger MD   Signed by:   Cassandria Anger MD on 02/21/2010   Method used:   Print then Give to Patient   RxID:   [id number] LIBRAX 2.5-5 MG  CAPS (CLIDINIUM-CHLORDIAZEPOXIDE) Take 1 tablet by mouth as needed bid  #180 x 3   Entered and Authorized by:   Cassandria Anger MD   Signed by:   Cassandria Anger MD on 02/21/2010   Method used:   Print then Give to Patient   RxID:   [id number] PREVACID 30 MG  CPDR (LANSOPRAZOLE) two times a day  #180 x 3   Entered and Authorized by:   Evie Lacks  Plotnikov MD   Signed by:   Cassandria Anger MD on 02/21/2010   Method used:   Print then Give to Patient   RxID:   [id number]    Immunizations Administered:  Tetanus Vaccine:    Vaccine Type: Tdap    Site: right deltoid    Mfr: [employer]Smith[employer]    Dose: 0.5 ml    Route: IM    Given by: Jonathon Resides, CMA(AAMA)    Exp. Date: 01/05/2012    Lot #: [id number]    VIS given: 05/26/07 version given February 21, 2010.

## 2010-08-07 NOTE — Assessment & Plan Note (Signed)
Summary: ANNUAL F/U GERD, IBS, history of polyps    History of Present Illness Visit Type: Follow-up Visit Primary GI MD: Scarlette Shorts MD Primary Provider: Walker Kehr, MD Chief Complaint: Yearly office visit with No GI complaints History of Present Illness:   65 year old female with hypertension, asthma, obesity, gastroesophageal reflux disease, irritable bowel syndrome, and adenomatous colon polyps. She presents today for routine GI followup. She was last seen one year ago. In terms of her reflux disease, she is maintained on lansoprazole 30 mg b.i.d. Despite this, she complains of breakthrough reflux symptoms 2-3 times per week. For this, she will open a lansoprazole capsule and ingest the contents. She feels that the brand name Prevacid worked better. No dysphagia. We reviewed the optimal timing of proton pump inhibitor administration. Her irritable bowel is stable. She continues to use Librax. Her multiple general medical problems are stable. Review of outside laboratories from 6 weeks ago reveals mildly elevated glucose at 104 and mildly elevated ALT of 64. Other labs normal.   GI Review of Systems    Reports acid reflux, belching, and  heartburn.      Denies abdominal pain, bloating, chest pain, dysphagia with liquids, dysphagia with solids, loss of appetite, nausea, vomiting, vomiting blood, weight loss, and  weight gain.      Reports irritable bowel syndrome.     Denies anal fissure, black tarry stools, change in bowel habit, constipation, diarrhea, diverticulosis, fecal incontinence, heme positive stool, hemorrhoids, jaundice, light color stool, liver problems, rectal bleeding, and  rectal pain.    Current Medications (verified): 1)  Prevacid 30 Mg  Cpdr (Lansoprazole) .... Two Times A Day 2)  Librax 2.5-5 Mg  Caps (Clidinium-Chlordiazepoxide) .... Take 1 Tablet By Mouth As Needed Bid 3)  Clonazepam 1 Mg  Tabs (Clonazepam) .... Take 1-2 Tablets By Mouth At Bedtime 4)  Skelaxin 800  Mg  Tabs (Metaxalone) .Marland Kitchen.. 1 By Mouth Two Times A Day 5)  Toprol Xl 100 Mg  Tb24 (Metoprolol Succinate) .... Take 1 Tablet By Mouth Once A Day 6)  Benicar 40 Mg  Tabs (Olmesartan Medoxomil) .Marland Kitchen.. 1 By Mouth Once Daily 7)  Premarin 0.625 Mg Tabs (Estrogens Conjugated) .... One By Mouth Daily/alternates With 0.3 8)  Spironolactone 50 Mg  Tabs (Spironolactone) .... Take 1 Tablet By Mouth Once A Day 9)  Vitamin D3 1000 Unit  Tabs (Cholecalciferol) .Marland Kitchen.. 1 By Mouth Daily 10)  Proair Hfa 108 (90 Base) Mcg/act  Aers (Albuterol Sulfate) .... 2 Inh Q4h As Needed Shortness of Breath 11)  Premarin 0.3 Mg Tabs (Estrogens Conjugated) .Marland Kitchen.. 1 By Mouth Once Daily/alternates With The 0.625  Allergies (verified): 1)  ! Avelox Abc Pack (Moxifloxacin Hcl) 2)  Asa 3)  Diflucan (Fluconazole) (Fluconazole) 4)  Hydrocodone  Past History:  Past Medical History: Reviewed history from 09/20/2008 and no changes required. Asthma GERD Hypertension Obesity  Past Surgical History: Reviewed history from 09/21/2008 and no changes required. Hysterectomy Tonsillectomy Breast Reduction  Family History: Reviewed history from 09/21/2008 and no changes required. Family History Hypertension No asthma Family History of Colon Cancer: First Cousin Family History of Heart Disease: Father, Brother  Social History: Married Patient is a former smoker. -stopped 40 years ago Alcohol Use - yes-2 glasses occasionally Illicit Drug Use - no Patient does not get regular exercise.   Review of Systems       The patient complains of back pain.  The patient denies allergy/sinus, anemia, anxiety-new, arthritis/joint pain, blood in urine, breast changes/lumps, change  in vision, confusion, cough, coughing up blood, depression-new, fainting, fatigue, fever, headaches-new, hearing problems, heart murmur, heart rhythm changes, itching, menstrual pain, muscle pains/cramps, night sweats, nosebleeds, pregnancy symptoms, shortness of  breath, skin rash, sleeping problems, sore throat, swelling of feet/legs, swollen lymph glands, thirst - excessive , urination - excessive , urination changes/pain, urine leakage, vision changes, and voice change.    Vital Signs:  Patient profile:   65 year old female Height:      64 inches Weight:      220 pounds BMI:     37.90 BSA:     2.04 Pulse rate:   66 / minute Pulse rhythm:   regular BP sitting:   104 / 70  (left arm)  Vitals Entered By: Brewster Deborra Medina) (October 04, 2009 8:23 AM)  Physical Exam  General:  Well developed, well nourished, no acute distress. Head:  Normocephalic and atraumatic. Eyes:  PERRLA, no icterus. Nose:  No deformity, discharge,  or lesions. Mouth:  No deformity or lesions, dentition normal. Neck:  Supple; no masses or thyromegaly. Lungs:  Clear throughout to auscultation. Heart:  Regular rate and rhythm; no murmurs, rubs,  or bruits. Abdomen:  Soft, obese,nontender and nondistended. No masses, hepatosplenomegaly or hernias noted. Normal bowel sounds. Msk:  Symmetrical with no gross deformities. Normal posture. Pulses:  Normal pulses noted. Extremities:  No clubbing, cyanosis, edema or deformities noted. Neurologic:  Alert and  oriented x4;  grossly normal neurologically. Skin:  Intact without significant lesions or rashes. Psych:  Alert and cooperative. Normal mood and affect.   Impression & Recommendations:  Problem # 1:  GERD (ICD-530.81) Active GERD symptoms despite b.i.d. lansoprazole.  Plan: #1. Reflux precautions with attention to weight loss. Weight reduction would be most helpful. #2. Review then she should take lansoprazole 30 minutes before breakfast and 30 minutes before the evening meal #3. For breakthrough symptoms try antacids #4. Lansoprazole prescription electronically refills x1 year #5. Routine followup in one year. Sooner for questions or problems.  Problem # 2:  IBS (ICD-564.1) Stable. Seems to be doing well on  Librax.  Plan: Excellent one. High fiber diet #2. Continue Librax p.r.n. #3. Librax prescription refilled electronically x1 year  #4. Routine followup in one year  Problem # 3:  PERSONAL HX COLONIC POLYPS (ICD-V12.72) history of adenomatous colon polyps. Due for surveillance around April 2013. She tells me that this needs to be done at the hospital with propofol sedation  Patient Instructions: 1)  Rx. for Librax and Prevacid printed and given to patient for mail order x 1 year. 2)  Please schedule a follow-up appointment in 1 year. 3)  The medication list was reviewed and reconciled.  All changed / newly prescribed medications were explained.  A complete medication list was provided to the patient / caregiver. 4)  printed and given to patient. [name] NCMA  October 04, 2009 9:18 AM Prescriptions: LIBRAX 2.5-5 MG  CAPS (CLIDINIUM-CHLORDIAZEPOXIDE) Take 1 tablet by mouth as needed bid Brand medically necessary #180 x 3   Entered by:   [name] NCMA   Authorized by:   Irene Shipper MD   Signed by:   [name] NCMA on 10/04/2009   Method used:   Print then Give to Patient   RxID:   [id number] PREVACID 30 MG  CPDR (LANSOPRAZOLE) two times a day  #180 x 3   Entered by:   [name] NCMA   Authorized by:   Irene Shipper MD  Signed by:   [name] NCMA on 10/04/2009   Method used:   Print then Give to Patient   RxID:   [id number]

## 2010-08-07 NOTE — Procedures (Signed)
Summary: Colonoscopy Report/[facility]  Colonoscopy Report/[facility]   Imported By: Laural Benes 11/30/2009 11:24:28  _____________________________________________________________________  External Attachment:    Type:   Image     Comment:   External Document

## 2010-08-07 NOTE — Assessment & Plan Note (Signed)
Summary: 3 [facility] F/U #/ CD   Vital Signs:  Patient profile:   65 year old female Weight:      215 pounds BMI:     37.04 Temp:     98.7 degrees F oral Pulse rate:   74 / minute BP sitting:   92 / 68  (left arm)  Vitals Entered By: Doralee Albino (August 23, 2009 1:57 PM) CC: f/u Is Patient Diabetic? No   Primary Care Provider:  Walker Kehr, MD  CC:  f/u.  History of Present Illness: The patient presents for a follow up of hypertension, asthma, hyperlipidemia, cough  Preventive Screening-Counseling & Management  Alcohol-Tobacco     Smoking Status: never  Current Medications (verified): 1)  Prevacid 30 Mg  Cpdr (Lansoprazole) .... Two Times A Day 2)  Librax 2.5-5 Mg  Caps (Clidinium-Chlordiazepoxide) .... Take 1 Tablet By Mouth As Needed Bid 3)  Clonazepam 1 Mg  Tabs (Clonazepam) .... Take 1-2 Tablets By Mouth At Bedtime 4)  Skelaxin 800 Mg  Tabs (Metaxalone) .Marland Kitchen.. 1 By Mouth Two Times A Day 5)  Toprol Xl 100 Mg  Tb24 (Metoprolol Succinate) .... Take 1 Tablet By Mouth Once A Day 6)  Benicar 40 Mg  Tabs (Olmesartan Medoxomil) .Marland Kitchen.. 1 By Mouth Once Daily 7)  Premarin 0.625 Mg Tabs (Estrogens Conjugated) .... One By Mouth Daily 8)  Spironolactone 50 Mg  Tabs (Spironolactone) .... Take 1 Tablet By Mouth Once A Day 9)  Terazol 3 0.8 % Crea (Terconazole) .... Pv As Dirr 10)  Vitamin D3 1000 Unit  Tabs (Cholecalciferol) .Marland Kitchen.. 1 By Mouth Daily 11)  Proair Hfa 108 (90 Base) Mcg/act  Aers (Albuterol Sulfate) .... 2 Inh Q4h As Needed Shortness of Breath 12)  Premarin 0.3 Mg Tabs (Estrogens Conjugated) .Marland Kitchen.. 1 By Mouth Qd  Allergies: 1)  ! Avelox Abc Pack (Moxifloxacin Hcl) 2)  Asa 3)  Diflucan (Fluconazole) (Fluconazole) 4)  Hydrocodone  Past History:  Past Medical History: Last updated: 09/20/2008 Asthma GERD Hypertension Obesity  Social History: Last updated: 09/21/2008 Married Patient is a former smoker. -stopped 40 years ago Alcohol Use - yes-2 glasses daily Illicit  Drug Use - no Patient does not get regular exercise.   Review of Systems  The patient denies hoarseness, dyspnea on exertion, abdominal pain, and hematochezia.    Physical Exam  General:  alert and overweight-appearing Nose:  External nasal examination shows no deformity or inflammation. Nasal mucosa are pink and moist without lesions or exudates. Mouth:  Oral mucosa and oropharynx without lesions or exudates.  Teeth in good repair. Lungs:  Normal respiratory effort, chest expands symmetrically. Lungs are clear to auscultation, no crackles or wheezes. Heart:  Normal rate and regular rhythm. S1 and S2 normal without gallop, murmur, click, rub or other extra sounds. Abdomen:  Obese but Soft, nontender and nondistended. No masses, hepatosplenomegaly or hernias noted. Normal bowel sounds. Msk:  No deformity or scoliosis noted of thoracic or lumbar spine.   Skin:  Intact without suspicious lesions or rashes Psych:  Cognition and judgment appear intact. Alert and cooperative with normal attention span and concentration. No apparent delusions, illusions, hallucinations   Impression & Recommendations:  Problem # 1:  HYPERTENSION (ICD-401.9) Assessment Improved  Her updated medication list for this problem includes:    Toprol Xl 100 Mg Tb24 (Metoprolol succinate) .Marland Kitchen... Take 1 tablet by mouth once a day    Benicar 40 Mg Tabs (Olmesartan medoxomil) .Marland Kitchen... 1 by mouth once daily    Spironolactone  50 Mg Tabs (Spironolactone) .Marland Kitchen... Take 1 tablet by mouth once a day  Problem # 2:  GERD (ICD-530.81) Assessment: Improved  Her updated medication list for this problem includes:    Prevacid 30 Mg Cpdr (Lansoprazole) .Marland Kitchen..Marland Kitchen Two times a day    Librax 2.5-5 Mg Caps (Clidinium-chlordiazepoxide) .Marland Kitchen... Take 1 tablet by mouth as needed bid  Problem # 3:  HYPERGLYCEMIA (ICD-790.29) Assessment: Improved The labs were reviewed with the patient.   Problem # 4:  LIVER FUNCTION TESTS, ABNORMAL, HX OF  (ICD-V12.2) Assessment: Improved The labs were reviewed with the patient.   Complete Medication List: 1)  Prevacid 30 Mg Cpdr (Lansoprazole) .... Two times a day 2)  Librax 2.5-5 Mg Caps (Clidinium-chlordiazepoxide) .... Take 1 tablet by mouth as needed bid 3)  Clonazepam 1 Mg Tabs (Clonazepam) .... Take 1-2 tablets by mouth at bedtime 4)  Skelaxin 800 Mg Tabs (Metaxalone) .Marland Kitchen.. 1 by mouth two times a day 5)  Toprol Xl 100 Mg Tb24 (Metoprolol succinate) .... Take 1 tablet by mouth once a day 6)  Benicar 40 Mg Tabs (Olmesartan medoxomil) .Marland Kitchen.. 1 by mouth once daily 7)  Premarin 0.625 Mg Tabs (Estrogens conjugated) .... One by mouth daily 8)  Spironolactone 50 Mg Tabs (Spironolactone) .... Take 1 tablet by mouth once a day 9)  Terazol 3 0.8 % Crea (Terconazole) .... Pv as dirr 10)  Vitamin D3 1000 Unit Tabs (Cholecalciferol) .Marland Kitchen.. 1 by mouth daily 11)  Proair Hfa 108 (90 Base) Mcg/act Aers (Albuterol sulfate) .... 2 inh q4h as needed shortness of breath 12)  Premarin 0.3 Mg Tabs (Estrogens conjugated) .Marland Kitchen.. 1 by mouth qd  Patient Instructions: 1)  Try to eat more raw plant food, fresh and dry fruit, raw almonds, leafy vegetables, whole foods and less red meat, less animal fat. Poultry and fish is better for you than pork and beef. Avoid processed foods (canned soups, hot dogs, sausage, bacon , frozen dinners). Avoid corn syrup, high fructose syrup or aspartam and Splenda  containing drinks. Honey, Agave and Stevia are better sweeteners. Make your own  dressing with olive oil, wine vinegar, lemon juce, garlic etc. for your salads. 2)  Please schedule a follow-up appointment in 6 months. 3)  BMP prior to visit, ICD-9:790.29 4)  Hepatic Panel prior to visit, ICD-9: 5)  HbgA1C prior to visit, ICD-9: Prescriptions: SKELAXIN 800 MG  TABS (METAXALONE) 1 by mouth two times a day  #180 x 3   Entered and Authorized by:   Cassandria Anger MD   Signed by:   Cassandria Anger MD on 08/23/2009   Method  used:   Print then Give to Patient   RxID:   [id number] SKELAXIN 800 MG  TABS (METAXALONE) 1 by mouth two times a day  #90 x 3   Entered and Authorized by:   Cassandria Anger MD   Signed by:   Cassandria Anger MD on 08/23/2009   Method used:   Print then Give to Patient   RxID:   [id number] TOPROL XL 100 MG  TB24 (METOPROLOL SUCCINATE) Take 1 tablet by mouth once a day  #90 x 3   Entered and Authorized by:   Cassandria Anger MD   Signed by:   Cassandria Anger MD on 08/23/2009   Method used:   Print then Give to Patient   RxID:   [id number] SKELAXIN 800 MG  TABS (METAXALONE) 1 by mouth two times a day  #60 x 6  Entered and Authorized by:   Cassandria Anger MD   Signed by:   Cassandria Anger MD on 08/23/2009   Method used:   Print then Give to Patient   RxID:   [id number] CLONAZEPAM 1 MG  TABS (CLONAZEPAM) Take 1-2 tablets by mouth at bedtime  #60 x 6   Entered and Authorized by:   Cassandria Anger MD   Signed by:   Cassandria Anger MD on 08/23/2009   Method used:   Print then Give to Patient   RxID:   [id number]

## 2010-08-07 NOTE — Progress Notes (Signed)
Summary: premarin  Phone Note Other Incoming Call back at fax   [facility] Summary of Call: refill request on premarin---ok x 2 refills will fax back to pharmacy. Initial call taken by: Doralee Albino,  August 25, 2009 4:21 PM    Prescriptions: PREMARIN 0.625 MG TABS (ESTROGENS CONJUGATED) one by mouth daily  #90 x 2   Entered by:   Doralee Albino   Authorized by:   Cassandria Anger MD   Signed by:   Doralee Albino on 08/25/2009   Method used:   Telephoned to ...       [facility] [employer] [place]       [address] [place]  [address]       Ph: [phone]       Fax: [phone]   RxID:   [id number]  fax # is  [phone]

## 2010-08-07 NOTE — Medication Information (Signed)
Summary: Librax DENIED/[facility]  Librax DENIED/[facility]   Imported By: Phillis Knack 10/30/2009 10:08:47  _____________________________________________________________________  External Attachment:    Type:   Image     Comment:   External Document

## 2010-08-07 NOTE — Miscellaneous (Signed)
Summary: Flu 2011   Clinical Lists Changes  Observations: Added new observation of FLU VAX: Historical (05/04/2010 14:38)      Immunization History:  Influenza Immunization History:    Influenza:  historical (05/04/2010)

## 2010-08-07 NOTE — Progress Notes (Signed)
Summary: Prior Authorization   Phone Note Call from Patient Call back at Home Phone [phone]   Caller: Eveline Keto Call For: Dr. Henrene Pastor Reason for Call: Talk to Nurse Summary of Call: Ins. needs prior auth for Librax [phone] Initial call taken by: Webb Laws,  October 19, 2009 12:33 PM  Follow-up for Phone Call        Called  and systems are temporarily down.  Was directed to call back in 1-2 hours.  [name] [facility]  October 23, 2009 11:24 AM Called patient's husband and advised I would try back and get PA for Brand name Librax and give him a call as soon as I get it.  [name] [facility]  October 23, 2009 11:35 AM Called PA/[facility]  again  Attn:  [facility] with [employer] patients.    Additional Follow-up for Phone Call Additional follow up Details #1::        Called pt. again to inform that servers are still down and I would try again later to initiate PA.  [name] NCMA  October 23, 2009 2:23 PM Called for PA attn: Angelita Ingles  PA was denied for brandname Librax due to no documentation regarding rash reaction to generic.  Pt.'s husband states to call in generic and if she has another reaction he will contact Dr. Henrene Pastor and we will try to get PA at that time.  [name] NCMA  October 25, 2009 10:57 AM.    Prescriptions: LIBRAX 2.5-5 MG  CAPS (CLIDINIUM-CHLORDIAZEPOXIDE) Take 1 tablet by mouth as needed bid  #180 x 3   Entered by:   [name] NCMA   Authorized by:   Irene Shipper MD   Signed by:   [name] NCMA on 10/25/2009   Method used:   Print then Give to Patient   RxID:   [id number]

## 2010-08-07 NOTE — Progress Notes (Signed)
Summary: Librax   Phone Note Call from Patient Call back at Home Phone [phone]   Caller: husband Call For: Dr.  Henrene Pastor Reason for Call: Talk to Nurse Summary of Call: needs prior auth for Librax Initial call taken by: Lucien Mons,  October 04, 2009 4:33 PM  Follow-up for Phone Call        Requested chart.  Called and left message for pt. to call with insurance info for PA.  Husband called and [facility] was given.  [name] [facility]  October 11, 2009 11:25 AM  called [facility].  attnJeneen Rinks.  States it has to be rejected from pharmacy first before PA can be done.  Called patient back and advised him to go ahead and send in Rx. and if rejected, I will get a PA then.  He agreed to do so.   Follow-up by: [name] NCMA,  October 11, 2009 11:32 AM

## 2010-08-09 ENCOUNTER — Ambulatory Visit (INDEPENDENT_AMBULATORY_CARE_PROVIDER_SITE_OTHER): Admitting: Internal Medicine

## 2010-08-09 ENCOUNTER — Encounter: Payer: Self-pay | Admitting: Internal Medicine

## 2010-08-09 DIAGNOSIS — I10 Essential (primary) hypertension: Secondary | ICD-10-CM

## 2010-08-09 DIAGNOSIS — H9319 Tinnitus, unspecified ear: Secondary | ICD-10-CM

## 2010-08-09 DIAGNOSIS — J329 Chronic sinusitis, unspecified: Secondary | ICD-10-CM

## 2010-08-09 DIAGNOSIS — J309 Allergic rhinitis, unspecified: Secondary | ICD-10-CM

## 2010-08-09 NOTE — Progress Notes (Signed)
Summary: Needs 10days worth of prevacid   Phone Note Call from Patient Call back at Home Phone [phone]   Caller: spouse Jamie [name] Call For: Dr Henrene Pastor  Summary of Call: Takes Prevacid two pills daily. Is all out. Mail order pharmacy says it will be about 10days until they will receive refill. Wonders if she can get 10days worth of medications? Initial call taken by: [name] [facility],  July 30, 2010 12:15 PM  Follow-up for Phone Call        Left patient samples at front desk and pt. notified.  [name] [facility]  July 30, 2010 12:47 PM      Appended Document: Needs 10days worth of prevacid    Clinical Lists Changes  Medications: Changed medication from PREVACID 30 MG  CPDR (LANSOPRAZOLE) two times a day to PREVACID 30 MG  CPDR (LANSOPRAZOLE) two times a day

## 2010-08-15 ENCOUNTER — Other Ambulatory Visit: Payer: Self-pay | Admitting: Internal Medicine

## 2010-08-15 DIAGNOSIS — J329 Chronic sinusitis, unspecified: Secondary | ICD-10-CM

## 2010-08-15 NOTE — Assessment & Plan Note (Signed)
Summary: FLU SYMPTOMS/ CONGESTION/[facility]   Vital Signs:  Patient profile:   65 year old female Menstrual status:  hysterectomy Height:      64 inches Weight:      223.25 pounds BMI:     38.46 O2 Sat:      93 % on Room air Temp:     97.9 degrees F oral Pulse rate:   70 / minute Pulse rhythm:   regular Resp:     16 per minute BP sitting:   110 / 64  (left arm) Cuff size:   large  Vitals Entered By: Wheelwright (August 09, 2010 1:42 PM)  Nutrition Counseling: Patient's BMI is greater than 25 and therefore counseled on weight management options.  O2 Flow:  Room air CC: pt c/o sinus congestion, URI symptoms     Menstrual Status hysterectomy Last PAP Result normal   Primary Care Provider:  Walker Kehr, MD  CC:  pt c/o sinus congestion and URI symptoms.  History of Present Illness:  URI Symptoms      This is a 65 year old woman who presents with URI symptoms.  The symptoms began 1 week ago.  The severity is described as moderate.  The patient reports nasal congestion, purulent nasal discharge, sore throat, and sick contacts, but denies dry cough, productive cough, and earache.  The patient denies fever, stiff neck, dyspnea, wheezing, rash, vomiting, diarrhea, use of an antipyretic, and response to antipyretic.  The patient also reports itchy throat, sneezing, seasonal symptoms, response to antihistamine, and severe fatigue.  The patient denies headache and muscle aches.  Risk factors for Strep sinusitis include unilateral facial pain, unilateral nasal discharge, poor response to decongestant, and double sickening.  The patient denies the following risk factors for Strep sinusitis: tooth pain, Strep exposure, tender adenopathy, and absence of cough.    Allergies (verified): 1)  ! Avelox Abc Pack (Moxifloxacin Hcl) 2)  Asa 3)  Diflucan (Fluconazole) (Fluconazole) 4)  Hydrocodone  Past History:  Past Medical History: Last updated:  01/18/2010 Asthma GERD Hypertension Obesity Allergic rhinitis  Past Surgical History: Last updated: 09/21/2008 Hysterectomy Tonsillectomy Breast Reduction  Family History: Last updated: 09/21/2008 Family History Hypertension No asthma Family History of Colon Cancer: First Cousin Family History of Heart Disease: Father, Brother  Social History: Last updated: 10/04/2009 Married Patient is a former smoker. -stopped 40 years ago Alcohol Use - yes-2 glasses occasionally Illicit Drug Use - no Patient does not get regular exercise.   Risk Factors: Exercise: no (09/21/2008)  Risk Factors: Smoking Status: never (08/23/2009)  Family History: Reviewed history from 09/21/2008 and no changes required. Family History Hypertension No asthma Family History of Colon Cancer: First Cousin Family History of Heart Disease: Father, Brother  Social History: Reviewed history from 10/04/2009 and no changes required. Married Patient is a former smoker. -stopped 40 years ago Alcohol Use - yes-2 glasses occasionally Illicit Drug Use - no Patient does not get regular exercise.   Review of Systems  The patient denies anorexia, fever, weight loss, weight gain, decreased hearing, hoarseness, chest pain, syncope, dyspnea on exertion, peripheral edema, prolonged cough, headaches, hemoptysis, abdominal pain, hematuria, suspicious skin lesions, transient blindness, enlarged lymph nodes, and angioedema.   General:  Denies chills, fatigue, fever, loss of appetite, malaise, sleep disorder, and sweats. ENT:  Complains of nasal congestion, ringing in ears, and sinus pressure; denies decreased hearing, difficulty swallowing, ear discharge, earache, hoarseness, nosebleeds, postnasal drainage, and sore throat.  Physical Exam  General:  alert, well-developed, well-nourished, well-hydrated, appropriate dress, normal appearance, healthy-appearing, cooperative to examination, good hygiene, and  overweight-appearing.   Head:  normocephalic, atraumatic, no abnormalities observed, and no abnormalities palpated.   Eyes:  vision grossly intact, pupils equal, and no injection.   Ears:  she has seroud fluid in both ears but the TM's are pearly gray with positive light reflex Nose:  no external deformity, no airflow obstruction, no intranasal foreign body, no nasal polyps, no nasal mucosal lesions, no mucosal friability, no active bleeding or clots, no septum abnormalities, nasal dischargemucosal pallor, mucosal edema, and R maxillary sinus tenderness.   Mouth:  good dentition, pharynx pink and moist, no erythema, no exudates, no posterior lymphoid hypertrophy, no postnasal drip, no pharyngeal crowing, no lesions, no aphthous ulcers, no erosions, no tongue abnormalities, no leukoplakia, and no petechiae.   Neck:  supple, full ROM, no masses, no thyromegaly, no thyroid nodules or tenderness, no JVD, normal carotid upstroke, no carotid bruits, no cervical lymphadenopathy, and no neck tenderness.   Lungs:  normal respiratory effort, no intercostal retractions, no accessory muscle use, normal breath sounds, no dullness, no fremitus, no crackles, and no wheezes.   Heart:  normal rate, regular rhythm, no murmur, no gallop, no rub, and no JVD.   Abdomen:  soft, non-tender, normal bowel sounds, no distention, no masses, no guarding, no rigidity, no rebound tenderness, no abdominal hernia, no inguinal hernia, no hepatomegaly, and no splenomegaly.   Msk:  No deformity or scoliosis noted of thoracic or lumbar spine.   Pulses:  R and L carotid,radial,femoral,dorsalis pedis and posterior tibial pulses are full and equal bilaterally Extremities:  No clubbing, cyanosis, edema, or deformity noted with normal full range of motion of all joints.   Neurologic:  No cranial nerve deficits noted. Station and gait are normal. Plantar reflexes are down-going bilaterally. DTRs are symmetrical throughout. Sensory, motor and  coordinative functions appear intact. Skin:  Intact without suspicious lesions or rashes Cervical Nodes:  no anterior cervical adenopathy and no posterior cervical adenopathy.   Axillary Nodes:  no R axillary adenopathy and no L axillary adenopathy.   Inguinal Nodes:  no R inguinal adenopathy and no L inguinal adenopathy.   Psych:  Cognition and judgment appear intact. Alert and cooperative with normal attention span and concentration. 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 2 spray/side once daily    Allegra-d Allergy & Congestion 180-240 Mg Xr24h-tab (Fexofenadine-pseudoephedrine) ..... One by mouth once daily for sinus congestion    Augmentin 500-125 Mg Tab (Amoxicillin-pot clavulanate) .Marland Kitchen... Take 1 capsule by mouth three times a day x 10 days  Orders: Radiology Referral (Radiology)  Problem # 3:  ALLERGIC RHINITIS (ICD-477.9) Assessment: Deteriorated  The following medications were removed from the medication list:    Levocetirizine Dihydrochloride 5 Mg Tabs (Levocetirizine dihydrochloride) .Marland Kitchen... 1po once daily Her updated medication list for this problem includes:    Fluticasone Propionate 50 Mcg/act Susp (Fluticasone propionate) .Marland Kitchen... 2 spray/side once daily  Problem # 4:  HYPERTENSION (ICD-401.9) Assessment: Improved  Her updated medication list for this problem includes:    Toprol Xl 100 Mg Tb24 (Metoprolol succinate) .Marland Kitchen... Take  1 tablet by mouth once a day    Benicar 40 Mg Tabs (Olmesartan medoxomil) .Marland Kitchen... 1 by mouth once daily    Spironolactone 50 Mg Tabs  (Spironolactone) .Marland Kitchen... Take 1 tablet by mouth once a day  BP today: 110/64 Prior BP: 120/86 (02/21/2010)  Labs Reviewed: K+: 5.1 (02/15/2010) Creat: : 0.8 (02/15/2010)   Chol: 180 (05/12/2009)   HDL: 66.10 (05/12/2009)   LDL: 89 (05/12/2009)   TG: 124.0 (05/12/2009)  Complete Medication List: 1)  Prevacid 30 Mg Cpdr (Lansoprazole) .... Two times a day 2)  Librax 2.5-5 Mg Caps (Clidinium-chlordiazepoxide) .... Take 1 tablet by mouth as needed bid 3)  Clonazepam 1 Mg Tabs (Clonazepam) .... Take 1-2 tablets by mouth at bedtime 4)  Skelaxin 800 Mg Tabs (Metaxalone) .Marland Kitchen.. 1 by mouth two times a day 5)  Toprol Xl 100 Mg Tb24 (Metoprolol succinate) .... Take 1 tablet by mouth once a day 6)  Benicar 40 Mg Tabs (Olmesartan medoxomil) .Marland Kitchen.. 1 by mouth once daily 7)  Premarin 0.625 Mg Tabs (Estrogens conjugated) .... One by mouth daily/alternates with 0.3 8)  Spironolactone 50 Mg Tabs (Spironolactone) .... Take 1 tablet by mouth once a day 9)  Vitamin D3 1000 Unit Tabs (Cholecalciferol) .Marland Kitchen.. 1 by mouth daily 10)  Proair Hfa 108 (90 Base) Mcg/act Aers (Albuterol sulfate) .... 2 inh q4h as needed shortness of breath 11)  Premarin 0.3 Mg Tabs (Estrogens conjugated) .Marland Kitchen.. 1 by mouth once daily/alternates with the 0.625 12)  Fluticasone Propionate 50 Mcg/act Susp (Fluticasone propionate) .... 2 spray/side once daily 13)  Meclizine Hcl 12.5 Mg Tabs (Meclizine hcl) .Marland Kitchen.. 1-2 by mouth q 6hrs as needed dizziness 14)  Allegra-d Allergy & Congestion 180-240 Mg Xr24h-tab (Fexofenadine-pseudoephedrine) .... One by mouth once daily for sinus congestion 15)  Augmentin 500-125 Mg Tab (Amoxicillin-pot clavulanate) .... Take 1 capsule by mouth three times a day x 10 days  Patient Instructions: 1)  Please schedule a follow-up appointment in 1 month. 2)  It is important that you exercise regularly at least 20 minutes 5 times a week. If you develop chest pain, have severe difficulty breathing, or feel very tired , stop  exercising immediately and seek medical attention. 3)  You need to lose weight. Consider a lower calorie diet and regular exercise.  4)  Check your Blood Pressure regularly. If it is above 130/80: you should make an appointment. 5)  Take your antibiotic as prescribed until ALL of it is gone, but stop if you develop a rash or swelling and contact our office as soon as possible. 6)  Acute sinusitis symptoms for less than 10 days are not helped by antibiotics.Use warm moist compresses, and over the counter decongestants ( only as directed). Call if no improvement in 5-7 days, sooner if increasing pain, fever, or new symptoms. Prescriptions: BENICAR 40 MG  TABS (OLMESARTAN MEDOXOMIL) 1 by mouth once daily  #90 x 3   Entered and Authorized by:   Janith Lima MD   Signed by:   Janith Lima MD on 08/09/2010   Method used:   Electronically to        [facility] [phone]* (retail)       [place], [place]  [address]       Ph: [phone]       Fax: [phone]   RxID:   [id number] AUGMENTIN 500-125 MG TAB (AMOXICILLIN-POT CLAVULANATE) Take  1 capsule by mouth three times a day X 10 days  #42 x 2   Entered and Authorized by:   Janith Lima MD   Signed by:   Janith Lima MD on 08/09/2010   Method used:   Electronically to        [facility] [phone]* (retail)       [address]       [place], [place]  [address]       Ph: [phone]       Fax: [phone]   RxID:   [id number] ALLEGRA-D ALLERGY & CONGESTION 180-240 MG XR24H-TAB (FEXOFENADINE-PSEUDOEPHEDRINE) One by mouth once daily for sinus congestion  #30 x 11   Entered and Authorized by:   Janith Lima MD   Signed by:   Janith Lima MD on 08/09/2010   Method used:   Electronically to        [facility] [phone]* (retail)       [address]       [place], [place]  [address]       Ph: [phone]       Fax:  [phone]   RxID:   [id number]    Orders Added: 1)  Radiology Referral [Radiology] 2)  Audiology [Audio] 3)  Est. Patient Level IV [[address]]

## 2010-08-17 ENCOUNTER — Ambulatory Visit (INDEPENDENT_AMBULATORY_CARE_PROVIDER_SITE_OTHER)
Admission: RE | Admit: 2010-08-17 | Discharge: 2010-08-17 | Disposition: A | Discharge status: Home / Self Care | Source: Ambulatory Visit | Attending: Internal Medicine | Admitting: Internal Medicine

## 2010-08-17 DIAGNOSIS — J329 Chronic sinusitis, unspecified: Secondary | ICD-10-CM

## 2010-08-24 ENCOUNTER — Encounter (INDEPENDENT_AMBULATORY_CARE_PROVIDER_SITE_OTHER): Payer: Self-pay | Admitting: *Deleted

## 2010-08-24 ENCOUNTER — Other Ambulatory Visit

## 2010-08-24 ENCOUNTER — Other Ambulatory Visit: Payer: Self-pay | Admitting: Internal Medicine

## 2010-08-24 DIAGNOSIS — Z13220 Encounter for screening for lipoid disorders: Secondary | ICD-10-CM

## 2010-08-24 DIAGNOSIS — I10 Essential (primary) hypertension: Secondary | ICD-10-CM

## 2010-08-24 DIAGNOSIS — R5383 Other fatigue: Secondary | ICD-10-CM

## 2010-08-24 DIAGNOSIS — R5381 Other malaise: Secondary | ICD-10-CM

## 2010-08-24 DIAGNOSIS — R7309 Other abnormal glucose: Secondary | ICD-10-CM

## 2010-08-24 DIAGNOSIS — K219 Gastro-esophageal reflux disease without esophagitis: Secondary | ICD-10-CM

## 2010-08-24 LAB — LIPID PANEL
Cholesterol: 168 mg/dL (ref 0–200)
LDL Cholesterol: 86 mg/dL (ref 0–99)
Total CHOL/HDL Ratio: 3
VLDL: 17 mg/dL (ref 0.0–40.0)

## 2010-08-24 LAB — URINALYSIS
Bilirubin Urine: NEGATIVE
Hgb urine dipstick: NEGATIVE
Ketones, ur: NEGATIVE
Leukocytes, UA: NEGATIVE
Nitrite: NEGATIVE
pH: 5.5 (ref 5.0–8.0)

## 2010-08-24 LAB — CBC WITH DIFFERENTIAL/PLATELET
Basophils Relative: 0.2 % (ref 0.0–3.0)
Eosinophils Relative: 1.8 % (ref 0.0–5.0)
HCT: 38.9 % (ref 36.0–46.0)
Hemoglobin: 13.6 g/dL (ref 12.0–15.0)
Lymphs Abs: 1.7 10*3/uL (ref 0.7–4.0)
MCV: 93.9 fl (ref 78.0–100.0)
Monocytes Absolute: 0.5 10*3/uL (ref 0.1–1.0)
Monocytes Relative: 7.2 % (ref 3.0–12.0)
Neutro Abs: 4.1 10*3/uL (ref 1.4–7.7)
Platelets: 226 10*3/uL (ref 150.0–400.0)
RBC: 4.14 Mil/uL (ref 3.87–5.11)
WBC: 6.3 10*3/uL (ref 4.5–10.5)

## 2010-08-24 LAB — HEPATIC FUNCTION PANEL
ALT: 105 U/L — ABNORMAL HIGH (ref 0–35)
AST: 62 U/L — ABNORMAL HIGH (ref 0–37)
Total Bilirubin: 0.7 mg/dL (ref 0.3–1.2)
Total Protein: 6.6 g/dL (ref 6.0–8.3)

## 2010-08-24 LAB — BASIC METABOLIC PANEL
BUN: 16 mg/dL (ref 6–23)
Chloride: 108 mEq/L (ref 96–112)
GFR: 87.06 mL/min (ref >60.00)
Potassium: 4.4 mEq/L (ref 3.5–5.1)
Sodium: 141 mEq/L (ref 135–145)

## 2010-08-24 LAB — TSH: TSH: 1.34 u[IU]/mL (ref 0.35–5.50)

## 2010-08-27 ENCOUNTER — Telehealth: Payer: Self-pay | Admitting: Internal Medicine

## 2010-08-28 ENCOUNTER — Other Ambulatory Visit: Payer: Self-pay | Admitting: Internal Medicine

## 2010-08-28 ENCOUNTER — Ambulatory Visit (INDEPENDENT_AMBULATORY_CARE_PROVIDER_SITE_OTHER)
Admission: RE | Admit: 2010-08-28 | Discharge: 2010-08-28 | Disposition: A | Discharge status: Home / Self Care | Source: Ambulatory Visit | Attending: Internal Medicine | Admitting: Internal Medicine

## 2010-08-28 ENCOUNTER — Encounter: Payer: Self-pay | Admitting: Internal Medicine

## 2010-08-28 ENCOUNTER — Encounter (INDEPENDENT_AMBULATORY_CARE_PROVIDER_SITE_OTHER): Admitting: Internal Medicine

## 2010-08-28 DIAGNOSIS — M79609 Pain in unspecified limb: Secondary | ICD-10-CM

## 2010-08-28 DIAGNOSIS — Z0000 Encounter for general adult medical examination without abnormal findings: Secondary | ICD-10-CM

## 2010-08-28 DIAGNOSIS — R9431 Abnormal electrocardiogram [ECG] [EKG]: Secondary | ICD-10-CM | POA: Insufficient documentation

## 2010-08-29 ENCOUNTER — Encounter (INDEPENDENT_AMBULATORY_CARE_PROVIDER_SITE_OTHER): Payer: Self-pay | Admitting: *Deleted

## 2010-09-04 NOTE — Progress Notes (Signed)
Summary: Rf Clonazepam  Phone Note Refill Request Message from:  Fax from Pharmacy  Refills Requested: Medication #1:  CLONAZEPAM 1 MG  TABS Take 1-2 tablets by mouth at bedtime   Dosage confirmed as above?Dosage Confirmed   Supply Requested: 60   Last Refilled: 07/28/2010  Method Requested: Telephone to Pharmacy Initial call taken by: Jonathon Resides, [facility]),  August 27, 2010 4:57 PM  Follow-up for Phone Call        ok x 3 Follow-up by: Cassandria Anger MD,  August 27, 2010 9:44 PM  Additional Follow-up for Phone Call Additional follow up Details #1::        Rx called to pharmacy Additional Follow-up by: Jonathon Resides, [facility]),  August 28, 2010 5:03 PM    Prescriptions: CLONAZEPAM 1 MG  TABS (CLONAZEPAM) Take 1-2 tablets by mouth at bedtime  #60 x 3   Entered by:   Jonathon Resides, [facility])   Authorized by:   Cassandria Anger MD   Signed by:   Jonathon Resides, CMA(AAMA) on 08/28/2010   Method used:   Telephoned to ...       [facility] [address] #[id number]* (retail)       [place], [place]  [address]       Ph: [phone]       Fax: [phone]   RxID:   [id number]

## 2010-09-04 NOTE — Letter (Signed)
Summary: Primary Care Consult Scheduled Letter  [facility]   [place], [place] [address]   Phone: [phone]  Fax: [phone]      08/29/2010 MRN: [id number]  [address], [place]  [address]  [place]    Dear Ms. Longshore,      We have scheduled an appointment for you. At the recommendation of Dr.Plotnikov, we have scheduled A [facility]  on March 6 ,2012 at 1:00 PM .Their address is [place] .The office phone number is [phone].If this appointment day and time is not convenient for you, please feel free to call the office of the doctor you are being referred to at the number listed above and reschedule the appointment.     It is important for you to keep your scheduled appointments. We are here to make sure you are given good patient care. If you have questions or you have made changes to your appointment, please notify us at  [phone]       , ask for Jamie [name].   Thank you,  Patient Care Coordinator [facility]

## 2010-09-05 ENCOUNTER — Encounter: Payer: Self-pay | Admitting: Internal Medicine

## 2010-09-05 ENCOUNTER — Ambulatory Visit (INDEPENDENT_AMBULATORY_CARE_PROVIDER_SITE_OTHER): Admitting: Internal Medicine

## 2010-09-05 ENCOUNTER — Ambulatory Visit: Payer: Self-pay | Admitting: Internal Medicine

## 2010-09-05 ENCOUNTER — Telehealth: Payer: Self-pay | Admitting: Internal Medicine

## 2010-09-05 DIAGNOSIS — K219 Gastro-esophageal reflux disease without esophagitis: Secondary | ICD-10-CM

## 2010-09-05 DIAGNOSIS — Z86010 Personal history of colonic polyps: Secondary | ICD-10-CM

## 2010-09-11 ENCOUNTER — Ambulatory Visit (HOSPITAL_COMMUNITY): Attending: Internal Medicine

## 2010-09-11 DIAGNOSIS — J45909 Unspecified asthma, uncomplicated: Secondary | ICD-10-CM | POA: Insufficient documentation

## 2010-09-11 DIAGNOSIS — E669 Obesity, unspecified: Secondary | ICD-10-CM | POA: Insufficient documentation

## 2010-09-11 DIAGNOSIS — R9431 Abnormal electrocardiogram [ECG] [EKG]: Secondary | ICD-10-CM | POA: Insufficient documentation

## 2010-09-11 DIAGNOSIS — I10 Essential (primary) hypertension: Secondary | ICD-10-CM | POA: Insufficient documentation

## 2010-09-13 NOTE — Progress Notes (Signed)
Summary: [facility]  [place]   Imported By: Phillis Knack 09/06/2010 07:10:35  _____________________________________________________________________  External Attachment:    Type:   Image     Comment:   External Document

## 2010-09-13 NOTE — Assessment & Plan Note (Signed)
Summary: Yearly Follow up and med refills    History of Present Illness Visit Type: Follow-up Visit Primary GI MD: Scarlette Shorts MD Primary Provider: Walker Kehr, MD Chief Complaint: Yearly visit for med refills, No GI Complaints History of Present Illness:    65 year old female with hypertension, asthma, morbid obesity, irritable bowel syndrome,  fatty liver disease,GERD, and adenomatous colon polyps. She presents today for routine followup. since switching from lansoprazole 2  brand name Prevacid , her GERD has been under better control. no dysphagia. next, she reports that her interval bowel has been stable. Librax helps. She requests to medication refill.   GI Review of Systems    Reports acid reflux.      Denies abdominal pain, belching, bloating, chest pain, dysphagia with liquids, dysphagia with solids, heartburn, loss of appetite, nausea, vomiting, vomiting blood, weight loss, and  weight gain.      Reports irritable bowel syndrome.     Denies anal fissure, black tarry stools, change in bowel habit, constipation, diarrhea, diverticulosis, fecal incontinence, heme positive stool, hemorrhoids, jaundice, light color stool, liver problems, rectal bleeding, and  rectal pain.    Current Medications (verified): 1)  Prevacid 30 Mg  Cpdr (Lansoprazole) .... Two Times A Day 2)  Librax 2.5-5 Mg  Caps (Clidinium-Chlordiazepoxide) .... Take 1 Tablet By Mouth As Needed Bid 3)  Clonazepam 1 Mg  Tabs (Clonazepam) .... Take 1-2 Tablets By Mouth At Bedtime 4)  Skelaxin 800 Mg  Tabs (Metaxalone) .Marland Kitchen.. 1 By Mouth Two Times A Day 5)  Toprol Xl 100 Mg  Tb24 (Metoprolol Succinate) .... Take 1 Tablet By Mouth Once A Day 6)  Benicar 40 Mg  Tabs (Olmesartan Medoxomil) .Marland Kitchen.. 1 By Mouth Once Daily 7)  Premarin 0.625 Mg Tabs (Estrogens Conjugated) .... One By Mouth Daily/alternates With 0.3 8)  Spironolactone 50 Mg  Tabs (Spironolactone) .... Take 1 Tablet By Mouth Once A Day 9)  Vitamin D3 1000 Unit  Tabs  (Cholecalciferol) .Marland Kitchen.. 1 By Mouth Daily 10)  Proair Hfa 108 (90 Base) Mcg/act  Aers (Albuterol Sulfate) .... 2 Inh Q4h As Needed Shortness of Breath 11)  Premarin 0.3 Mg Tabs (Estrogens Conjugated) .Marland Kitchen.. 1 By Mouth Once Daily/alternates With The 0.625 12)  Fluticasone Propionate 50 Mcg/act Susp (Fluticasone Propionate) .... 2 Spray/side Once Daily 13)  Meclizine Hcl 12.5 Mg Tabs (Meclizine Hcl) .Marland Kitchen.. 1-2 By Mouth Q 6hrs As Needed Dizziness 14)  Voltaren 1 % Gel (Diclofenac Sodium) .... Use Two Times A Day On A Joint(S)  Allergies (verified): 1)  ! Avelox Abc Pack (Moxifloxacin Hcl) 2)  Asa 3)  Diflucan (Fluconazole) (Fluconazole) 4)  Hydrocodone  Past History:  Past Medical History: Last updated: 01/18/2010 Asthma GERD Hypertension Obesity Allergic rhinitis  Past Surgical History: Last updated: 09/21/2008 Hysterectomy Tonsillectomy Breast Reduction  Family History: Last updated: 09/21/2008 Family History Hypertension No asthma Family History of Colon Cancer: First Cousin Family History of Heart Disease: Father, Brother  Social History: Last updated: 10/04/2009 Married Patient is a former smoker. -stopped 40 years ago Alcohol Use - yes-2 glasses occasionally Illicit Drug Use - no Patient does not get regular exercise.   Review of Systems  The patient denies allergy/sinus, anemia, anxiety-new, arthritis/joint pain, back pain, blood in urine, breast changes/lumps, change in vision, confusion, cough, coughing up blood, depression-new, fainting, fatigue, fever, headaches-new, hearing problems, heart murmur, heart rhythm changes, itching, menstrual pain, muscle pains/cramps, night sweats, nosebleeds, pregnancy symptoms, shortness of breath, skin rash, sleeping problems, sore throat, swelling of feet/legs,  swollen lymph glands, thirst - excessive , urination - excessive , urination changes/pain, urine leakage, vision changes, and voice change.    Vital Signs:  Patient  profile:   65 year old female Menstrual status:  hysterectomy Height:      64 inches Weight:      222 pounds BMI:     38.24 BSA:     2.05 Pulse rate:   80 / minute Pulse rhythm:   regular BP sitting:   110 / 60  (left arm)  Vitals Entered By: [place] Deborra Medina) (September 05, 2010 11:09 AM)  Physical Exam  General:  Well developed,  obese,well nourished, no acute distress. Head:  Normocephalic and atraumatic. Eyes:  PERRLA, no icterus. Mouth:  No deformity or lesions, dentition normal. Neck:  Supple; no masses or thyromegaly. Lungs:  Clear throughout to auscultation. Heart:  Regular rate and rhythm; no murmurs, rubs,  or bruits. Abdomen:  Soft,  obese,nontender and nondistended. No masses, hepatosplenomegaly or hernias noted. Normal bowel sounds. Pulses:  Normal pulses noted. Neurologic:  Alert and  oriented x4. Skin:  Intact without significant lesions or rashes. Psych:  Alert and cooperative. Normal mood and affect.   Impression & Recommendations:  Problem # 1:  GERD (ICD-530.81)  stable on twice a day PPI.  Plan: #1. continue Prevacid 30 mg twice a day #2. reflux precautions with attention to weight loss  #3. Routine followup in one year  Problem # 2:  IBS (ICD-564.1)  stable  and helped by Librax.   Plan   #1. continue  Librax. refill as requested  #2. call for questions or problems  Problem # 3:  ADENOMATOUS COLONIC POLYP (ICD-211.3)  due for followup next year. propofol  via MAC  Patient Instructions: 1)  Refilled Librax #180 x 3 RFs. printed and given to you for mail order. 2)  Please schedule a follow-up appointment in 1 year. 3)  Copy sent to : Walker Kehr, MD 4)  The medication list was reviewed and reconciled.  All changed / newly prescribed medications were explained.  A complete medication list was provided to the patient / caregiver. Prescriptions: LIBRAX 2.5-5 MG  CAPS (CLIDINIUM-CHLORDIAZEPOXIDE) Take 1 tablet by mouth as needed bid   #180 x 3   Entered by:   [name] NCMA   Authorized by:   Irene Shipper MD   Signed by:   [name] NCMA on 09/05/2010   Method used:   Print then Give to Patient   RxID:   [id number]

## 2010-09-13 NOTE — Progress Notes (Signed)
Summary: [facility]  [place]   Imported By: Phillis Knack 09/06/2010 07:08:08  _____________________________________________________________________  External Attachment:    Type:   Image     Comment:   External Document

## 2010-09-13 NOTE — Progress Notes (Signed)
Summary: [facility]  [place]   Imported By: Phillis Knack 09/06/2010 07:13:51  _____________________________________________________________________  External Attachment:    Type:   Image     Comment:   External Document

## 2010-09-13 NOTE — Assessment & Plan Note (Signed)
Summary: CPE / NWS  #   Vital Signs:  Patient profile:   65 year old female Menstrual status:  hysterectomy Height:      64 inches Weight:      223 pounds BMI:     38.42 Temp:     99.1 degrees F oral Pulse rate:   84 / minute Pulse rhythm:   regular Resp:     16 per minute BP sitting:   114 / 60  (left arm) Cuff size:   large  Vitals Entered By: Jonathon Resides, CMA(AAMA) (August 28, 2010 2:16 PM) CC: CPE Is Patient Diabetic? No Comments pt is not taking Premarin 0.625, Fluticasone or Allegra D.  She needs Rf on all meds   Primary Care Provider:  Walker Kehr, MD  CC:  CPE.  History of Present Illness: The patient presents for a preventive health examination  She fell on R side at home and sill c/o R shoullder and R lat forearm  Current Medications (verified): 1)  Prevacid 30 Mg  Cpdr (Lansoprazole) .... Two Times A Day 2)  Librax 2.5-5 Mg  Caps (Clidinium-Chlordiazepoxide) .... Take 1 Tablet By Mouth As Needed Bid 3)  Clonazepam 1 Mg  Tabs (Clonazepam) .... Take 1-2 Tablets By Mouth At Bedtime 4)  Skelaxin 800 Mg  Tabs (Metaxalone) .Marland Kitchen.. 1 By Mouth Two Times A Day 5)  Toprol Xl 100 Mg  Tb24 (Metoprolol Succinate) .... Take 1 Tablet By Mouth Once A Day 6)  Benicar 40 Mg  Tabs (Olmesartan Medoxomil) .Marland Kitchen.. 1 By Mouth Once Daily 7)  Premarin 0.625 Mg Tabs (Estrogens Conjugated) .... One By Mouth Daily/alternates With 0.3 8)  Spironolactone 50 Mg  Tabs (Spironolactone) .... Take 1 Tablet By Mouth Once A Day 9)  Vitamin D3 1000 Unit  Tabs (Cholecalciferol) .Marland Kitchen.. 1 By Mouth Daily 10)  Proair Hfa 108 (90 Base) Mcg/act  Aers (Albuterol Sulfate) .... 2 Inh Q4h As Needed Shortness of Breath 11)  Premarin 0.3 Mg Tabs (Estrogens Conjugated) .Marland Kitchen.. 1 By Mouth Once Daily/alternates With The 0.625 12)  Fluticasone Propionate 50 Mcg/act Susp (Fluticasone Propionate) .... 2 Spray/side Once Daily 13)  Meclizine Hcl 12.5 Mg Tabs (Meclizine Hcl) .Marland Kitchen.. 1-2 By Mouth Q 6hrs As Needed Dizziness 14)   Allegra-D Allergy & Congestion 180-240 Mg Xr24h-Tab (Fexofenadine-Pseudoephedrine) .... One By Mouth Once Daily For Sinus Congestion  Allergies (verified): 1)  ! Avelox Abc Pack (Moxifloxacin Hcl) 2)  Asa 3)  Diflucan (Fluconazole) (Fluconazole) 4)  Hydrocodone  Past History:  Past Medical History: Last updated: 01/18/2010 Asthma GERD Hypertension Obesity Allergic rhinitis  Past Surgical History: Last updated: 09/21/2008 Hysterectomy Tonsillectomy Breast Reduction  Family History: Last updated: 09/21/2008 Family History Hypertension No asthma Family History of Colon Cancer: First Cousin Family History of Heart Disease: Father, Brother  Social History: Last updated: 10/04/2009 Married Patient is a former smoker. -stopped 40 years ago Alcohol Use - yes-2 glasses occasionally Illicit Drug Use - no Patient does not get regular exercise.   Physical Exam  General:  alert, well-developed, overweight-appearing.   Head:  normocephalic, atraumatic, no abnormalities observed, and no abnormalities palpated.   Nose:  no external deformity, no airflow obstruction, no intranasal foreign body, no nasal polyps, no nasal mucosal lesions, no mucosal friability, no active bleeding or clots, no septum abnormalities, nasal dischargemucosal pallor, mucosal edema, and R maxillary sinus tenderness.   Mouth:  good dentition, pharynx pink and moist, no erythema, no exudates, no posterior lymphoid hypertrophy, no postnasal drip, no pharyngeal crowing,  no lesions, no aphthous ulcers, no erosions, no tongue abnormalities, no leukoplakia, and no petechiae.   Lungs:  normal respiratory effort, no intercostal retractions, no accessory muscle use, normal breath sounds, no dullness, no fremitus, no crackles, and no wheezes.   Heart:  normal rate, regular rhythm, no murmur, no gallop, no rub, and no JVD.   Abdomen:  soft, non-tender, normal bowel sounds, no distention, no masses, no guarding, no  rigidity, no rebound tenderness, no abdominal hernia, no inguinal hernia, no hepatomegaly, and no splenomegaly.   Msk:  No deformity or scoliosis noted of thoracic or lumbar spine.   Neurologic:  No cranial nerve deficits noted. Station and gait are normal. Plantar reflexes are down-going bilaterally. DTRs are symmetrical throughout. Sensory, motor and coordinative functions appear intact. Skin:  Intact without suspicious lesions or rashes Psych:  Cognition and judgment appear intact. Alert and cooperative with normal attention span and concentration. No apparent delusions, illusions, hallucinations   Impression & Recommendations:  Problem # 1:  HEALTH MAINTENANCE EXAM (ICD-V70.0) Assessment New Health and age related issues were discussed. Available screening tests and vaccinations were discussed as well. Healthy life style including good diet and exercise was discussed.  The labs were reviewed with the patient.  EKG reviewed w/Pt  Problem # 2:  ABNORMAL ELECTROCARDIOGRAM (ICD-794.31) Assessment: New  Orders: Echo Referral (Echo)  Problem # 3:  HYPERTENSION (ICD-401.9) Assessment: Unchanged  Her updated medication list for this problem includes:    Toprol Xl 100 Mg Tb24 (Metoprolol succinate) .Marland Kitchen... Take 1 tablet by mouth once a day    Benicar 40 Mg Tabs (Olmesartan medoxomil) .Marland Kitchen... 1 by mouth once daily    Spironolactone 50 Mg Tabs (Spironolactone) .Marland Kitchen... Take 1 tablet by mouth once a day  BP today: 114/60 Prior BP: 110/64 (08/09/2010)  Labs Reviewed: K+: 4.4 (08/24/2010) Creat: : 0.7 (08/24/2010)   Chol: 168 (08/24/2010)   HDL: 65.40 (08/24/2010)   LDL: 86 (08/24/2010)   TG: 85.0 (08/24/2010)  Problem # 4:  GERD (ICD-530.81) Assessment: Unchanged  Her updated medication list for this problem includes:    Prevacid 30 Mg Cpdr (Lansoprazole) .Marland Kitchen..Marland Kitchen Two times a day    Librax 2.5-5 Mg Caps (Clidinium-chlordiazepoxide) .Marland Kitchen... Take 1 tablet by mouth as needed bid  Complete  Medication List: 1)  Prevacid 30 Mg Cpdr (Lansoprazole) .... Two times a day 2)  Librax 2.5-5 Mg Caps (Clidinium-chlordiazepoxide) .... Take 1 tablet by mouth as needed bid 3)  Clonazepam 1 Mg Tabs (Clonazepam) .... Take 1-2 tablets by mouth at bedtime 4)  Skelaxin 800 Mg Tabs (Metaxalone) .Marland Kitchen.. 1 by mouth two times a day 5)  Toprol Xl 100 Mg Tb24 (Metoprolol succinate) .... Take 1 tablet by mouth once a day 6)  Benicar 40 Mg Tabs (Olmesartan medoxomil) .Marland Kitchen.. 1 by mouth once daily 7)  Premarin 0.625 Mg Tabs (Estrogens conjugated) .... One by mouth daily/alternates with 0.3 8)  Spironolactone 50 Mg Tabs (Spironolactone) .... Take 1 tablet by mouth once a day 9)  Vitamin D3 1000 Unit Tabs (Cholecalciferol) .Marland Kitchen.. 1 by mouth daily 10)  Proair Hfa 108 (90 Base) Mcg/act Aers (Albuterol sulfate) .... 2 inh q4h as needed shortness of breath 11)  Premarin 0.3 Mg Tabs (Estrogens conjugated) .Marland Kitchen.. 1 by mouth once daily/alternates with the 0.625 12)  Fluticasone Propionate 50 Mcg/act Susp (Fluticasone propionate) .... 2 spray/side once daily 13)  Meclizine Hcl 12.5 Mg Tabs (Meclizine hcl) .Marland Kitchen.. 1-2 by mouth q 6hrs as needed dizziness 14)  Allegra-d Allergy &  Congestion 180-240 Mg Xr24h-tab (Fexofenadine-pseudoephedrine) .... One by mouth once daily for sinus congestion 15)  Voltaren 1 % Gel (Diclofenac sodium) .... Use two times a day on a joint(s)  Other Orders: T-Forearm Right (73090TC)  Patient Instructions: 1)  Please schedule a follow-up appointment in 6 months. 2)  Call if you are not better in a reasonable amount of time or if worse.  Prescriptions: VOLTAREN 1 % GEL (DICLOFENAC SODIUM) use two times a day on a joint(s)  #90 g x 3   Entered and Authorized by:   Cassandria Anger MD   Signed by:   Cassandria Anger MD on 08/28/2010   Method used:   Print then Give to Patient   RxID:   [id number] SPIRONOLACTONE 50 MG  TABS (SPIRONOLACTONE) Take 1 tablet by mouth once a day  #90 x 3    Entered and Authorized by:   Cassandria Anger MD   Signed by:   Cassandria Anger MD on 08/28/2010   Method used:   Print then Give to Patient   RxID:   [id number] PREMARIN 0.3 MG TABS (ESTROGENS CONJUGATED) 1 by mouth once daily/alternates with the 0.625  #90 x 3   Entered and Authorized by:   Cassandria Anger MD   Signed by:   Cassandria Anger MD on 08/28/2010   Method used:   Print then Give to Patient   RxID:   [id number] BENICAR 40 MG  TABS (OLMESARTAN MEDOXOMIL) 1 by mouth once daily  #90 x 3   Entered and Authorized by:   Cassandria Anger MD   Signed by:   Cassandria Anger MD on 08/28/2010   Method used:   Print then Give to Patient   RxID:   [id number] TOPROL XL 100 MG  TB24 (METOPROLOL SUCCINATE) Take 1 tablet by mouth once a day  #90 x 3   Entered and Authorized by:   Cassandria Anger MD   Signed by:   Cassandria Anger MD on 08/28/2010   Method used:   Print then Give to Patient   RxID:   [id number] CLONAZEPAM 1 MG  TABS (CLONAZEPAM) Take 1-2 tablets by mouth at bedtime  #60 x 0   Entered and Authorized by:   Cassandria Anger MD   Signed by:   Cassandria Anger MD on 08/28/2010   Method used:   Print then Give to Patient   RxID:   [id number] PREVACID 30 MG  CPDR (LANSOPRAZOLE) two times a day  #180 x 3   Entered and Authorized by:   Cassandria Anger MD   Signed by:   Cassandria Anger MD on 08/28/2010   Method used:   Print then Give to Patient   RxID:   [id number] SKELAXIN 800 MG  TABS (METAXALONE) 1 by mouth two times a day  #180 x 3   Entered and Authorized by:   Cassandria Anger MD   Signed by:   Cassandria Anger MD on 08/28/2010   Method used:   Print then Give to Patient   RxID:   [id number] CLONAZEPAM 1 MG  TABS (CLONAZEPAM) Take 1-2 tablets by mouth at bedtime  #180 x 1   Entered and Authorized by:   Cassandria Anger MD   Signed by:   Cassandria Anger MD on 08/28/2010   Method  used:   Print then Give to Patient   RxID:   [id number]    Orders  Added: 1)  T-Forearm Right [73090TC] 2)  Echo Referral [Echo] 3)  Est. Patient 65& > [[id number]]

## 2010-09-13 NOTE — Letter (Signed)
Summary: [place]  [place]   Imported By: Phillis Knack 09/06/2010 07:09:18  _____________________________________________________________________  External Attachment:    Type:   Image     Comment:   External Document

## 2010-09-13 NOTE — Progress Notes (Signed)
Summary: [place]  [place]   Imported By: Phillis Knack 09/06/2010 07:11:27  _____________________________________________________________________  External Attachment:    Type:   Image     Comment:   External Document

## 2010-09-13 NOTE — Procedures (Signed)
Summary: [facility]   Imported By: Phillis Knack 09/06/2010 07:07:14  _____________________________________________________________________  External Attachment:    Type:   Image     Comment:   External Document

## 2010-09-13 NOTE — Progress Notes (Signed)
Summary: Premarin ?  Phone Note From Pharmacy   Caller: [facility] Summary of Call: rec fax needing clarification on sig and quantity on Premarin 0.62m. If pt takes every other day alternating with 0.625. Can they dispense #45 for a 90 day supply? If not, please clarify sig/quantity.  ph [phone] [phone] fax new Rx to [phone] call taken by: [facility]Jonathon Resides [facility],  September 05, 2010 5:21 PM  Follow-up for Phone Call        yes Thank you!  Follow-up by: ACassandria AngerMD,  September 05, 2010 6:18 PM  Additional Follow-up for Phone Call Additional follow up Details #1::        fax completed and faxed to above number. Additional Follow-up by: [facility],  September 06, 2010 9:20 AM

## 2010-09-13 NOTE — Progress Notes (Signed)
Summary: [place]  [place]   Imported By: Phillis Knack 09/06/2010 07:12:23  _____________________________________________________________________  External Attachment:    Type:   Image     Comment:   External Document

## 2010-11-20 NOTE — Assessment & Plan Note (Signed)
[facility]                             PULMONARY OFFICE NOTE   JAYLANNI, ELTRINGHAM                     MRN:          [id number]  DATE:01/29/2007                            DOB:          1945-12-16    CHIEF COMPLAINT:  Dyspnea.   HISTORY OF PRESENT ILLNESS:  This is a 65 year old female who had  pneumonia in February 2007 for which she was hospitalized.  Since that  time, she has had 3 or 4 episodes of acute bronchitis and sinusitis for  which she has received antibiotics and episodic prednisone.  Her last  dose of prednisone antibiotics was in June 2008.  She was given an  albuterol inhaler which caused extreme shakiness.  She was then switched  to Symbicort 80/4.5, of which she just received a sample yesterday.  She  also has Xopenex in a nebulizer which she is taking p.r.n.  She has a  productive cough of clear thick mucus that is difficult to raise.  She  is short of breath with rest and exertion.  She is short of breath at  night.  She is on Prevacid 30 mg twice daily and is not having reflux  symptoms at this time on this.  She smoked during college but has not  smoked in 30 years, but had heavy passive smoke exposure growing up with  her uncle and father, both of whom died of lung cancer.  She is short of  breath with activity and at rest.  She has a productive cough, as noted.  There is no hemoptysis.  She notes irregular heart beats.  She has had  no change in weight, no abdominal pain.  There has been no difficulty  swallowing, sore throat, headaches, or tooth or dental problems.  She  notes chronic postnasal drainage, headache bifrontal, with left  cheekbone pain and left upper tooth pain.   PAST MEDICAL HISTORY:  1. History of hypertension.  2. Heart disease.  3. History of diagnosed asthma in the past.  4. No history of stroke, blood clots, cancer, kidney disease, or      disorders of the central nervous system.   OPERATIVE  HISTORY:  1. Hysterectomy in 1978.  2. Breast reduction surgery in 1991.  3. Tonsillectomy in 1968.   MEDICATION ALLERGIES:  AVELOX causes tachycardia.   CURRENT MEDICATIONS:  1. Prevacid 30 mg b.i.d.  2. Librax 5 mg daily.  3. Benicar/HCT 40/25 daily.  4. Calcium twice daily.  5. Premarin daily.  6. Clonazepam 1 mg 1-2 times daily.  7. Skelaxin 800 mg daily.  8. Loratadine 10 mg daily.  9. Vitamin D 3 daily.  10.Bystolic 10 mg daily.  11.Symbicort 2 sprays b.i.d., 80 mcg strength/4.5.  12.Xopenex by nebulization p.r.n.   SOCIAL HISTORY:  Smoking history is as noted above.  Lives at home with  her husband.  She is retired from Personal assistant.   FAMILY HISTORY:  Cancer in the lung of father and uncles.  Daughter had  melanoma.  Heart disease in father, brother, and paternal uncles.   REVIEW OF SYSTEMS:  Hand swelling in the mornings, and joint pain if she  sits too long.   PHYSICAL EXAMINATION:  GENERAL:  This is an obese female, in no  distress.  VITAL SIGNS:  Temperature 98, blood pressure 138/88, pulse 72,  saturation was 93% on room air.  CHEST:  Showed inspiratory and expiratory wheeze, with poor air flow.  CARDIAC:  Showed a regular rate and rhythm, without S3.  Normal S1, S2.  ABDOMEN:  Soft, nontender.  EXTREMITIES:  Showed no edema, clubbing, or venous disease.  SKIN:  Clear.  NEUROLOGIC:  Intact.  HEENT:  Showed nasal inflammation, with minimal purulence.  Oropharynx  was clear.  NECK:  Supple.   LABORATORY DATA:  Spirometry showed essentially normal spirometry, with  FEF 25/75 at 92% predicted, FEV1 at 92% predicted, but an expiratory  flow volume loop that had a flutter pattern.   IMPRESSION:  1. Postnasal drip syndrome, with upper airway instability.  2. Vocal cord dysfunction syndrome.  3. Asthmatic bronchitis, associated acute flare, with probable chronic      sinusitis.   PLAN:  1. Begin and continue Symbicort at 2 sprays b.i.d., 80/4.5.  2. Obtain  sinus CT scan.  3. Give Augmentin 875 b.i.d. for 10 days.  4. Pulse prednisone at 40 mg a day, with a slow taper.  5. Return this patient for a recheck in 2 weeks.    Burnett Harry Joya Gaskins, MD, [facility]  Electronically Signed   PEW/MedQ  DD: 01/29/2007  DT: 01/30/2007  Job #: [id number]   cc:   Evie Lacks. Plotnikov, MD

## 2010-11-20 NOTE — Assessment & Plan Note (Signed)
[facility]                                 ON-CALL NOTE   GENTRI, GUARDADO                       MRN:          [id number]  DATE:11/28/2007                            DOB:          04-Jan-1946    OBJECTIVE:  The patient is experiencing chest spasms and shortness of  breath and was advised to go the emergency room to be seen.  Primary  care Conlan Miceli is Dr. Tessa Lerner and home office is [place].     Modesto Charon, MD  Electronically Signed    RNS/MedQ  DD: 11/28/2007  DT: 11/29/2007  Job #: [id number]

## 2010-11-20 NOTE — Assessment & Plan Note (Signed)
[place] [facility] OFFICE NOTE   Jamie [name], Jamie [name]                     MRN:          [id number]  DATE:09/25/2007                            DOB:          Feb 06, 1946    HISTORY:  Jamie [name] is a 65 year old white female with a history of  gastroesophageal reflux disease, irritable bowel syndrome, adenomatous  colon polyps, hypertension, and obesity.  She presents today to  establish GI care.  She has previously been cared for by Dr. Lyla [name]  up until the time of his retirement.  As stated, patient has a history  of adenomatous colon polyps and last underwent surveillance colonoscopy  October 16, 2006.  Examination was normal.  She also has a history of  gastroesophageal reflux disease for which she has undergone prior  endoscopy.  There was no evidence of Barrett's esophagus.  To control  her symptoms she requires Prevacid 30 mg b.i.d. and has been on this for  some time.  Her history of irritable bowel is a little more sketchy.  However, she has had this diagnosis for many years and tells me that she  takes one Librax at night to keep her gut calm.  Currently no problems  with nausea, vomiting, heartburn, dysphagia, abdominal pain, change in  bowel habits or bleeding.  She is allergic to Avelox and intolerant to  albuterol.   CURRENT MEDICATIONS:  Prevacid, Librax, multivitamin, Benicar, calcium,  Premarin, Clonazepam, Skelaxin, vitamin D, hydrochlorothiazide, and  Toprol ER.  She also uses ibuprofen p.r.n.   PHYSICAL EXAMINATION:  Physical exam finds a well appearing female in no  acute distress.  Blood pressure is 118/66, heart rate is 64.  Her weight  is 216.8 pounds.  HEENT:  Sclerae anicteric.  Conjunctivae are pink.  Oral mucosa is  intact.  No adenopathy.  LUNGS:  Clear.  HEART:  Regular.  ABDOMEN:  Soft without tenderness, mass, or hernia.  Good bowel sounds  heard.   IMPRESSION:  1.  Gastroesophageal reflux disease.  Symptoms controlled with b.i.d.      Prevacid.  2. History of irritable bowel.  Stable on Librax.  3. History of adenomatous colon polyps.  She will be due for      surveillance in 2013.  4. General medical problems under the care of Dr. Alain [name].   RECOMMENDATIONS:  1. Refill Prevacid 30 mg b.i.d.  2. Refill Librax 1 at night.  3. Colonoscopy in 2013.  4. Ongoing general medical care with Dr. Alain [name].     Jamie [name]. Jamie Pastor, MD  Electronically Signed    JNP/MedQ  DD: 09/25/2007  DT: 09/25/2007  Job #: [id number]   cc:   Jamie Lacks. Plotnikov, MD

## 2010-11-22 ENCOUNTER — Encounter: Payer: Self-pay | Admitting: Internal Medicine

## 2010-11-23 NOTE — H&P (Signed)
NAMECURLEY, Jamie [name]   MEDICAL RECORD NO.:  [id number]          PATIENT TYPE:  INP   LOCATION:  0102                         FACILITY:  [facility]   PHYSICIAN:  Evie Lacks. Plotnikov, M.D. Jamie [name] OF BIRTH:  [birth year]   DATE OF ADMISSION:  09/04/2005  DATE OF DISCHARGE:                                HISTORY & PHYSICAL   CHIEF COMPLAINT:  Shortness of breath, fever, cough.   HISTORY OF PRESENT ILLNESS:  The patient is a 65 year old female with 1-week  history of pneumonia-like illness. She was put on Levaquin on Monday. Two  days ago had to go to the emergency room due to worsening of breathing. Was  treated there for 8 hours and released home with improvement (received IV  Avelox). This morning woke up with more shortness of breath, tightness, pain  in the right side of the chest, feeling worse. Husband brought her here to  admit.   PAST MEDICAL HISTORY:  1.  GERD.  2.  Hypertension.  3.  IBS.  4.  History of mitral valve prolapse.  5.  Migraine headaches.  6.  Restless legs.  7.  History of colon polyps.  8.  History of tachycardia.   ALLERGIES:  AVELOX IV caused irritation of the skin.   FAMILY HISTORY:  Positive for coronary disease.   SOCIAL HISTORY:  She is married. She has a remote history of smoking.   REVIEW OF SYSTEMS:  She is menopausal. Chest pain, shortness of breath,  cough as above. No syncope. Feeling very weak. The rest is negative.   PHYSICAL EXAMINATION:  VITAL SIGNS:  Blood pressure 139/81, temperature  100.1, weight 218 pounds, pulse 80.  GENERAL:  She is in mild distress, appears ill.  HEENT:  With slightly dryish oral mucosa, erythematous throat.  NECK:  Supple, no thyromegaly.  LUNGS:  With decreased breath sounds, prolonged expiratory phase, bilateral  rhonchi more at bases.  HEART:  With tachycardia.  SKIN:  Without cyanosis.  ABDOMEN:  Soft, nontender.  LOWER EXTREMITIES:  Without edema, calves nontender.  NEUROLOGIC:  She is alert, oriented, cooperative.   LABORATORY DATA:  O2 saturation is 92% on room air.   ASSESSMENT AND PLAN:  1.  Refractory pneumonia. Will admit for IV antibiotics, Zithromax/Rocephin.  2.  Right-sided chest pain, likely due to the above, rule out pulmonary      embolus. Obtain CT scan of the chest. Chest CK, troponin x1. Repeat EKG.  3.  Bronchospasm. Will use albuterol/Atrovent, start on IV Solu-Medrol.  4.  Dyspnea/borderline hypoxemia. Start on oxygen.           ______________________________  Evie Lacks Plotnikov, M.D. [facility]     AVP/MEDQ  D:  09/04/2005  T:  09/04/2005  Job:  [id number]

## 2010-11-23 NOTE — Discharge Summary (Signed)
Jamie [name], Jamie [name] NO.:  [id number]   MEDICAL RECORD NO.:  [id number]          PATIENT TYPE:  INP   LOCATION:  3893                         FACILITY:  [facility]   PHYSICIAN:  Gwendolyn Grant, M.D. LHCDATE OF BIRTH:  July 10, 1945   DATE OF ADMISSION:  09/04/2005  DATE OF DISCHARGE:  09/06/2005                                 DISCHARGE SUMMARY   DISCHARGE DIAGNOSES:  1.  Acute bronchitis with asthmatic component.  2.  Probable obstructive sleep apnea.   HISTORY OF PRESENT ILLNESS:  The patient is a 65 year old female admitted on  September 04, 2005, with chief complaint of shortness of breath, fever, and  cough. The patient had been placed on Levaquin as an outpatient, however  began to feel worse at home and presented to her primary care who admitted  her IV antibiotics and further evaluation.   PAST MEDICAL HISTORY:  1.  GERD.  2.  Hypertension.  3.  Irritable bowel syndrome.  4.  Irritable bowel syndrome.  5.  History of mitral valve prolapse.  6.  History of migraine headaches.  7.  Restless leg syndrome.  8.  History of colon polyps.  9.  History of tachycardia.   COURSE OF HOSPITALIZATION:  Problem #1:  Acute bronchitis with asthmatic  component. The patient was admitted and placed on IV Solu-Medrol. Her  symptoms improved. She was also placed on IV Rocephin and IV azithromycin.  The patient has remained afebrile. O2 saturation on room air is 93%. At the  time of discharge the patient has no audible wheezing, good air movement,  and feeling better. At this time plan to discharge home on a slow prednisone  taper as well as p.o. Ceftin. The patient is to continue albuterol MDI q.4h.  p.r.n. She is instructed, however, that should she become increasingly  shortness of breath or develop chest pain that she should return to the  emergency room.   Problem #2:  Probable obstructive sleep apnea. The patient is morbidly obese  and at high risk for obstructive  sleep apnea. Consider outpatient sleep  study and pulmonary function tests to be performed after acute illness is  resolved. The patient denies history of asthma and only had a short remote  smoking history. Also of note the patient underwent during this admission a  CT angiogram of the chest which was negative for PE. It did note mild  enlarged mediastinal, hilar lymph nodes, and a calcified granuloma. A follow-  up CT was recommended in six months to ensure resolution of lymphadenopathy.   MEDICATIONS AT DISCHARGE:  1.  Albuterol MDI inhaler two puffs q.4-6h. p.r.n.  2.  Prednisone 60 mg on March 3rd, 50 mg on March 4th and 5th, 40 mg on      March 6th and 7th, 30 mg on March 8th and 9th, 20 mg on March 9th and      10th, 10 mg on March 11th and 12th, and then discontinue.  3.  Ceftin 500 mg p.o. b.i.d. for seven days.  4.  Prevacid 30 mg b.i.d.  5.  Librax 5/2.5 one  tab t.i.d.  6.  Toprol XL  100 mg p.o. daily.  7.  Benicar 20/12.5 mg p.o. daily.  8.  Premarin 0.4 mg p.o. daily.  9.  Hydrochlorothiazide 25 mg p.o. daily.  10. Skelaxin 800 mg p.o. p.r.n.  11. Clonazepam 0.5 mg p.o. daily.   LABORATORIES AT DISCHARGE:  Hemoglobin 12.7, hematocrit 37.1, BUN 9,  creatinine 0.8.   FOLLOWUP:  The patient is scheduled to follow up with Dr. Evie Lacks.  Plotnikov on Tuesday, March 6th at 1:30 p.m.      Melissa S. Inda Castle, NP      Gwendolyn Grant, M.D. [facility]  Electronically Signed    MSO/MEDQ  D:  09/06/2005  T:  09/06/2005  Job:  [id number]   cc:   Evie Lacks. Plotnikov, M.D. [facility]  [address]  [place] [address]

## 2010-11-26 ENCOUNTER — Encounter: Payer: Self-pay | Admitting: Internal Medicine

## 2010-11-26 ENCOUNTER — Ambulatory Visit (INDEPENDENT_AMBULATORY_CARE_PROVIDER_SITE_OTHER): Admitting: Internal Medicine

## 2010-11-26 DIAGNOSIS — R404 Transient alteration of awareness: Secondary | ICD-10-CM

## 2010-11-26 DIAGNOSIS — F29 Unspecified psychosis not due to a substance or known physiological condition: Secondary | ICD-10-CM

## 2010-11-26 DIAGNOSIS — Z8639 Personal history of other endocrine, nutritional and metabolic disease: Secondary | ICD-10-CM

## 2010-11-26 DIAGNOSIS — Z862 Personal history of diseases of the blood and blood-forming organs and certain disorders involving the immune mechanism: Secondary | ICD-10-CM

## 2010-11-26 DIAGNOSIS — R400 Somnolence: Secondary | ICD-10-CM

## 2010-11-26 DIAGNOSIS — F4489 Other dissociative and conversion disorders: Secondary | ICD-10-CM | POA: Insufficient documentation

## 2010-11-26 DIAGNOSIS — I10 Essential (primary) hypertension: Secondary | ICD-10-CM

## 2010-11-26 NOTE — Assessment & Plan Note (Signed)
It was 1 month ago at 1 am. She woke up confused and had blue lips.

## 2010-11-26 NOTE — Patient Instructions (Signed)
Stop Premarin please Go to ER if it happens again

## 2010-11-26 NOTE — Progress Notes (Signed)
  Subjective:    Patient ID: Jamie [name], female    DOB: 01-19-1946, 65 y.o.   MRN: [id number]  HPI  C/o episode of confusion at 1 am 1 mo ago with blue lips and nails and also passed out They did not go to ER  Review of Systems  Constitutional: Positive for fatigue. Negative for activity change and unexpected weight change.  HENT: Negative for sneezing, drooling and mouth sores.   Eyes: Negative for photophobia and discharge.  Respiratory: Negative for wheezing.   Gastrointestinal: Negative for anal bleeding.  Genitourinary: Negative for frequency, decreased urine volume and dyspareunia.  Musculoskeletal: Negative for back pain.  Skin: Negative for pallor and rash.  Neurological: Negative for dizziness, tremors, weakness and numbness.  Hematological: Negative for adenopathy.  Psychiatric/Behavioral: The patient is nervous/anxious.        Objective:   Physical Exam  Constitutional: She appears well-developed and well-nourished. No distress.       Obese  HENT:  Head: Normocephalic.  Right Ear: External ear normal.  Left Ear: External ear normal.  Nose: Nose normal.  Mouth/Throat: Oropharynx is clear and moist.  Eyes: Conjunctivae are normal. Pupils are equal, round, and reactive to light. Right eye exhibits no discharge. Left eye exhibits no discharge.  Neck: Normal range of motion. Neck supple. No JVD present. No tracheal deviation present. No thyromegaly present.  Cardiovascular: Normal rate, regular rhythm and normal heart sounds.   Pulmonary/Chest: No stridor. No respiratory distress. She has no wheezes.  Abdominal: Soft. Bowel sounds are normal. She exhibits no distension and no mass. There is no tenderness. There is no rebound and no guarding.  Musculoskeletal: She exhibits no edema and no tenderness.  Lymphadenopathy:    She has no cervical adenopathy.  Neurological: She displays normal reflexes. No cranial nerve deficit. She exhibits normal muscle tone.  Coordination normal.  Skin: No rash noted. No erythema.  Psychiatric: She has a normal mood and affect. Her behavior is normal. Judgment and thought content normal.       Lab Results  Component Value Date   WBC 6.3 08/24/2010   HGB 13.6 08/24/2010   HCT 38.9 08/24/2010   PLT 226.0 08/24/2010   CHOL 168 08/24/2010   TRIG 85.0 08/24/2010   HDL 65.40 08/24/2010   ALT 105* 08/24/2010   AST 62* 08/24/2010   NA 141 08/24/2010   K 4.4 08/24/2010   CL 108 08/24/2010   CREATININE 0.7 08/24/2010   BUN 16 08/24/2010   CO2 27 08/24/2010   TSH 1.34 08/24/2010   HGBA1C 5.5 02/15/2010      Assessment & Plan:  Confusion state It was 1 month ago at 1 am. She woke up confused and had blue lips.   D/c HRT Pulm Consult Sleep test No ETOH  Cyanotic episodes related to above  Head CT  A complex case

## 2010-11-30 ENCOUNTER — Ambulatory Visit (INDEPENDENT_AMBULATORY_CARE_PROVIDER_SITE_OTHER)
Admission: RE | Admit: 2010-11-30 | Discharge: 2010-11-30 | Disposition: A | Discharge status: Home / Self Care | Source: Ambulatory Visit | Attending: Internal Medicine | Admitting: Internal Medicine

## 2010-11-30 DIAGNOSIS — F29 Unspecified psychosis not due to a substance or known physiological condition: Secondary | ICD-10-CM

## 2010-11-30 DIAGNOSIS — R404 Transient alteration of awareness: Secondary | ICD-10-CM

## 2010-12-26 ENCOUNTER — Encounter: Payer: Self-pay | Admitting: Critical Care Medicine

## 2010-12-26 ENCOUNTER — Ambulatory Visit (INDEPENDENT_AMBULATORY_CARE_PROVIDER_SITE_OTHER)
Admission: RE | Admit: 2010-12-26 | Discharge: 2010-12-26 | Disposition: A | Discharge status: Home / Self Care | Payer: Self-pay | Source: Ambulatory Visit | Attending: Critical Care Medicine | Admitting: Critical Care Medicine

## 2010-12-26 ENCOUNTER — Ambulatory Visit (INDEPENDENT_AMBULATORY_CARE_PROVIDER_SITE_OTHER): Admitting: Critical Care Medicine

## 2010-12-26 VITALS — BP 122/80 | HR 73 | Temp 98.1°F | Ht 63.0 in | Wt 222.6 lb

## 2010-12-26 DIAGNOSIS — J984 Other disorders of lung: Secondary | ICD-10-CM

## 2010-12-26 DIAGNOSIS — F29 Unspecified psychosis not due to a substance or known physiological condition: Secondary | ICD-10-CM

## 2010-12-26 DIAGNOSIS — F4489 Other dissociative and conversion disorders: Secondary | ICD-10-CM

## 2010-12-26 NOTE — Patient Instructions (Addendum)
A sleep study will be obtained Chest xray today I will confer with Dr Alain Marion about your medications No med changes for now  I will call when sleep study is obtained

## 2010-12-26 NOTE — Assessment & Plan Note (Signed)
Acute on chronic confusional state exacerbated by polypharmacy and ETOH use >>>librax/klonopin/skelaxin with QHS ETOH and elevated LFTs, poor hepatic metabolism, r/o OSA with nocturnal hypoxemia Restless leg syndrome would be better managed with mirapex  Plan Sleep study Discuss with pcp MD pts polypharmacy  Pt counseled to reduce ETOH use

## 2010-12-26 NOTE — Progress Notes (Signed)
Subjective:    Patient ID: Jamie [name], female    DOB: Apr 02, 1946, 65 y.o.   MRN: [id number]  HPI 65 y.o.WF.  Two months ago:  Husband went to bed, pt was up on computer late, then went to bed , pt snores, ?developed shortness of breath, tried to take neb med rx,  Got confused, not visibly dyspneic but very agitated.  Finally took BD neb and did get better.  Has difficulty putting all things together.  Feels abit constricted now.  Never had sleep study.  Has fatigue and tiredness.   Snoring. No observed apneas per husband. Has had three episodes , two at night, one in afternoon. Pt previously seen in pulm for restrictive lung dz and allergic rhinitis in 2008-2009      Past Medical History  Diagnosis Date  . Asthma   . GERD (gastroesophageal reflux disease)   . Hypertension   . Obesity   . Allergic rhinitis      Family History  Problem Relation Age of Onset  . Heart disease Father   . Heart disease Brother   . Asthma Neg Hx   . Hypertension Other   . Cancer Cousin     colon  . Allergies Daughter   . Allergies Brother   . Lung cancer Father   . Cancer Daughter     malignant melanoma     History   Social History  . Marital Status: Married    Spouse Name: N/A    Number of Children: 2  . Years of Education: N/A   Occupational History  . Retired Other   Social History Main Topics  . Smoking status: Never Smoker   . Smokeless tobacco: Never Used  . Alcohol Use: Yes     daily>>>counselled to reduce ETOH use   . Drug Use: No  . Sexually Active: Not on file   Other Topics Concern  . Not on file   Social History Narrative  . No narrative on file     Allergies  Allergen Reactions  . Aspirin     REACTION: ulcers  . Hydrocodone     REACTION: jerking  . Moxifloxacin      Outpatient Prescriptions Prior to Visit  Medication Sig Dispense Refill  . albuterol (PROAIR HFA) 108 (90 BASE) MCG/ACT inhaler Inhale 2 puffs into the lungs every 4 (four) hours as  needed. For SOB       . Cholecalciferol (EQL VITAMIN D3) 1000 UNITS tablet Occasionally      . clidinium-chlordiazePOXIDE (LIBRAX) 2.5-5 MG per capsule Take 1 capsule by mouth at bedtime.       . clonazePAM (KLONOPIN) 1 MG tablet 2 tablets po qhs      . fluticasone (FLONASE) 50 MCG/ACT nasal spray Place 2 sprays into the nose daily as needed.       . lansoprazole (PREVACID) 30 MG capsule Take 30 mg by mouth 2 (two) times daily.        . meclizine (ANTIVERT) 12.5 MG tablet 1-2 po q 6 hours prn for dizziness       . metaxalone (SKELAXIN) 800 MG tablet 2 tablets at bedtime      . metoprolol (TOPROL-XL) 100 MG 24 hr tablet Take 100 mg by mouth daily.        Marland Kitchen olmesartan (BENICAR) 40 MG tablet Take 40 mg by mouth daily.        Marland Kitchen PRENATAL VITAMINS PO Take 1 tablet by mouth daily.        Marland Kitchen  spironolactone (ALDACTONE) 50 MG tablet Take 50 mg by mouth daily.        . diclofenac sodium (VOLTAREN) 1 % GEL Apply topically 2 (two) times daily.        Marland Kitchen estrogens, conjugated, (PREMARIN) 0.3 MG tablet Take by mouth. 1 po once daily/ alternates w/ the 0.625       . estrogens, conjugated, (PREMARIN) 0.625 MG tablet Take 0.625 mg by mouth daily. 1 po once daily/ alternates w/ the 0.3            Review of Systems  Constitutional: Positive for chills and fatigue. Negative for fever, diaphoresis, activity change, appetite change and unexpected weight change.  HENT: Positive for ear pain, congestion, mouth sores, postnasal drip and sinus pressure. Negative for hearing loss, nosebleeds, sore throat, facial swelling, rhinorrhea, sneezing, trouble swallowing, neck pain, neck stiffness, dental problem, voice change, tinnitus and ear discharge.   Eyes: Positive for itching. Negative for photophobia, discharge and visual disturbance.  Respiratory: Positive for chest tightness and shortness of breath. Negative for apnea, cough, choking, wheezing and stridor.   Cardiovascular: Positive for palpitations. Negative for chest  pain and leg swelling.  Gastrointestinal: Positive for constipation. Negative for nausea, vomiting, abdominal pain, blood in stool and abdominal distention.  Genitourinary: Positive for frequency. Negative for dysuria, urgency, hematuria, flank pain, decreased urine volume and difficulty urinating.  Musculoskeletal: Positive for myalgias and arthralgias. Negative for back pain, joint swelling and gait problem.  Skin: Negative for color change, pallor and rash.  Neurological: Positive for syncope and headaches. Negative for dizziness, tremors, seizures, speech difficulty, weakness, light-headedness and numbness.  Hematological: Negative for adenopathy. Does not bruise/bleed easily.  Psychiatric/Behavioral: Positive for confusion and sleep disturbance. Negative for agitation. The patient is nervous/anxious.        Objective:   Physical Exam Filed Vitals:   12/26/10 0906  BP: 122/80  Pulse: 73  Temp: 98.1 F (36.7 C)  TempSrc: Oral  Height: 5' 3"  (1.6 m)  Weight: 222 lb 9.6 oz (100.971 kg)  SpO2: 96%    Gen: obese, well-nourished, in no distress,  normal affect  ENT: No lesions,  mouth clear,  oropharynx clear, no postnasal drip  Neck: No JVD, no TMG, no carotid bruits  Lungs: No use of accessory muscles, no dullness to percussion, clear without rales or rhonchi  Cardiovascular: RRR, heart sounds normal, no murmur or gallops, no peripheral edema  Abdomen: soft and NT, no HSM,  BS normal  Musculoskeletal: No deformities, no cyanosis or clubbing  Neuro: alert, non focal, no overt confusion  Skin: Warm, no lesions or rashes     PFT Conversion 04/28/2008  FVC 2.72  FVC PREDICT 3.07  FVC  % Predicted 89  FEV1 2.36  FEV1 PREDICT 2.26  FEV % Predicted 104  FEV1/FVC 86.8  FEV1/FVC PRE 73  FeF 25-75 3.1  FeF 25-75 % Predicted 2.59  FEF % EXPEC 120  POST FVC 2.95  FVCPRDPST 3.07  POST FVC%EXP 96  POST FEV1 2.61  FEV1PRDPST 2.26  POSTFEV1%PRD 115  PSTFEV1/FVC 88    FEV1FVCPRDPS 73  PSTFEF25/75% 3.18  PSTFEF25/75P 2.59  FEF2575%EXPS 123  Residual Volume (ml) 0.89  RV % EXPECT 48  DLCO (ml/mmHg sec) 18.6  DLCO % EXPEC 75  DLCO/VA 5.03  DLCO/VA%EXP 133     Assessment & Plan:   Confusion state Acute on chronic confusional state exacerbated by polypharmacy and ETOH use >>>librax/klonopin/skelaxin with QHS ETOH and elevated LFTs, poor hepatic metabolism, r/o OSA  with nocturnal hypoxemia Restless leg syndrome would be better managed with mirapex  Plan Sleep study Discuss with pcp MD pts polypharmacy  Pt counseled to reduce ETOH use   RESTRICTIVE LUNG DISEASE Check CXR Weight loss Sleep study    Updated Medication List Outpatient Encounter Prescriptions as of 12/26/2010  Medication Sig Dispense Refill  . albuterol (PROAIR HFA) 108 (90 BASE) MCG/ACT inhaler Inhale 2 puffs into the lungs every 4 (four) hours as needed. For SOB       . Cholecalciferol (EQL VITAMIN D3) 1000 UNITS tablet Occasionally      . clidinium-chlordiazePOXIDE (LIBRAX) 2.5-5 MG per capsule Take 1 capsule by mouth at bedtime.       . clonazePAM (KLONOPIN) 1 MG tablet 2 tablets po qhs      . fluticasone (FLONASE) 50 MCG/ACT nasal spray Place 2 sprays into the nose daily as needed.       . lansoprazole (PREVACID) 30 MG capsule Take 30 mg by mouth 2 (two) times daily.        . meclizine (ANTIVERT) 12.5 MG tablet 1-2 po q 6 hours prn for dizziness       . metaxalone (SKELAXIN) 800 MG tablet 2 tablets at bedtime      . metoprolol (TOPROL-XL) 100 MG 24 hr tablet Take 100 mg by mouth daily.        Marland Kitchen olmesartan (BENICAR) 40 MG tablet Take 40 mg by mouth daily.        Marland Kitchen PRENATAL VITAMINS PO Take 1 tablet by mouth daily.        Marland Kitchen spironolactone (ALDACTONE) 50 MG tablet Take 50 mg by mouth daily.        Marland Kitchen DISCONTD: diclofenac sodium (VOLTAREN) 1 % GEL Apply topically 2 (two) times daily.        Marland Kitchen DISCONTD: estrogens, conjugated, (PREMARIN) 0.3 MG tablet Take by mouth. 1 po once  daily/ alternates w/ the 0.625       . DISCONTD: estrogens, conjugated, (PREMARIN) 0.625 MG tablet Take 0.625 mg by mouth daily. 1 po once daily/ alternates w/ the 0.3

## 2010-12-26 NOTE — Assessment & Plan Note (Signed)
Check CXR Weight loss Sleep study

## 2010-12-27 NOTE — Assessment & Plan Note (Signed)
On Rx 

## 2010-12-28 NOTE — Assessment & Plan Note (Signed)
Cut back on ETOH, loose wt

## 2010-12-29 NOTE — Progress Notes (Signed)
Quick Note:  Notify the patient that the Xray is normal No change in medications are recommended. Continue current meds as prescribed at last office visit ______

## 2010-12-31 NOTE — Progress Notes (Signed)
Quick Note:  Spoke with pt and notified of results per Dr. Joya Gaskins. Pt verbalized understanding and denied any questions.  ______

## 2011-01-11 ENCOUNTER — Other Ambulatory Visit: Payer: Self-pay | Admitting: Dermatology

## 2011-01-16 ENCOUNTER — Ambulatory Visit (HOSPITAL_BASED_OUTPATIENT_CLINIC_OR_DEPARTMENT_OTHER): Attending: Critical Care Medicine

## 2011-01-16 DIAGNOSIS — R0989 Other specified symptoms and signs involving the circulatory and respiratory systems: Secondary | ICD-10-CM | POA: Insufficient documentation

## 2011-01-16 DIAGNOSIS — G4733 Obstructive sleep apnea (adult) (pediatric): Secondary | ICD-10-CM | POA: Insufficient documentation

## 2011-01-16 DIAGNOSIS — F4489 Other dissociative and conversion disorders: Secondary | ICD-10-CM

## 2011-01-16 DIAGNOSIS — R5381 Other malaise: Secondary | ICD-10-CM | POA: Insufficient documentation

## 2011-01-16 DIAGNOSIS — R5383 Other fatigue: Secondary | ICD-10-CM | POA: Insufficient documentation

## 2011-01-16 DIAGNOSIS — R404 Transient alteration of awareness: Secondary | ICD-10-CM | POA: Insufficient documentation

## 2011-01-16 DIAGNOSIS — R0609 Other forms of dyspnea: Secondary | ICD-10-CM | POA: Insufficient documentation

## 2011-01-16 DIAGNOSIS — Z79899 Other long term (current) drug therapy: Secondary | ICD-10-CM | POA: Insufficient documentation

## 2011-01-23 NOTE — Procedures (Signed)
NAME:  Jamie [name], Jamie [name] NO.:  [id number]  MEDICAL RECORD NO.:  [id number]          PATIENT TYPE:  OUT  LOCATION:  SLEEP CENTER                 FACILITY:  [facility]  PHYSICIAN:  Rigoberto Noel, MD      DATE OF BIRTH:  1945-08-31  DATE OF STUDY:  01/16/2011                           NOCTURNAL POLYSOMNOGRAM  REFERRING PHYSICIAN:  PATRICK WRIGHT  INDICATION FOR STUDY:  Jamie [name] is a 65 year old woman with asthma, GERD, hypertension, and allergic rhinitis.  She reports excessive daytime somnolence and fatigue, loud snoring, witnessed apneas, trouble concentrating, and restless sleep.  At the time of this study, she weighed 220 pounds with a height of 5 feet 3 inches, BMI of 39, neck size of 15 inches.  EPWORTH SLEEPINESS SCORE:  Epworth sleepiness score was 18.  MEDICATIONS:  Bedtime medications included Benicar, metoprolol, lansoprazole, chlordiazepoxide, Skelaxin, and clonazepam.  This nocturnal polysomnogram was performed with sleep technologist in attendance.  EEG, EOG, EMG, EKG, and respiratory parameters were recorded.  Sleep stages, arousals, limb movements, and respiratory data were scored according to criteria laid out by the American Academy of Sleep Medicine.  SLEEP ARCHITECTURE:  Lights out was at 10:55 p.m., lights on was at 5:08 a.m.  Total sleep time was 206 minutes with a sleep period time of 362 minutes and sleep efficiency of 55%.  Sleep latency was 11 minutes. Latency to REM sleep was 301 minutes.  Wake after sleep onset was 158 minutes.  Sleep stages of the percentage of total sleep time was I 30%, II 53%, and III 11%, and REM sleep 6% (12 minutes).  Supine sleep accounted for 48 minutes.  No supine REM sleep was noted.  AROUSAL DATA:  There were total of 163 arousals with an arousal index of 47 events per hour.  Of these, 142 were spontaneous and the rest were associated with respiratory events.  RESPIRATORY DATA:  There were total of 15  obstructive apneas, zero central apneas, zero mixed apneas, and 16 hypopneas with an apnea/hypopnea index of 9 events per hour, 87 respiratory effort related arousals (RERAs) were noted with an RDI of 34 events per hour.  The longest apnea was 17 seconds and the longest hypopnea was 84 seconds.  MOVEMENT-PARASOMNIA:  The limb movement index was 1.3 events per hour, none of these were associated arousals.  OXYGEN DATA:  The desaturation index was 9 events per hour.  The lowest desaturation was 87%.  She spent 1.2 minutes with the saturation less than 88%.  CARDIAC DATA:  The low heart rate was 46 beats per minute.  The high heart rate recorded was an artifact.  No arrhythmias were noted.  DISCUSSION:  She maintained a sat of 91-92% during the study, for most of the study.  She is desensitized with a small full-face mask but did not meet criteria for emergency intervention.  </IMPRESSIONS-  1. Poor sleep efficiency which may be a first night effect. 2. Mild obstructive sleep apnea with predominant respiratory effort     related arousals suggestive of upper airway resistance syndrome.     These were not related to sleep stages or body position. 3. No evidence of cardiac  arrhythmias, limb movements, or behavioral     disturbance during sleep.  RECOMMENDATIONS/>  1. The treatment options for this degree of sleep disordered breathing     include weight loss and CPAP therapy.  Alternatively an oral     appliance can also be tried. 2. I would advocate weight loss as a primary therapy.  If this cannot     be achieved or if the patient is overly symptomatic CPAP can be     initiated.  Given the absence of comorbidities, auto CPAP can be     initiated. 3. She should be cautioned against driving when sleepy. 4. Her daytime somnolence was reported on the Epworth score and seems     to be out of proportion to the degree of sleep disordered breathing     and this may be related to her  medication such as chlordiazepoxide     and clonazepam with sedative side effects.     Rigoberto Noel, MD Electronically Signed    RVA/MEDQ  D:  01/22/2011 13:03:00  T:  01/23/2011 01:48:05  Job:  [id number]

## 2011-01-28 ENCOUNTER — Telehealth: Payer: Self-pay | Admitting: Critical Care Medicine

## 2011-01-28 ENCOUNTER — Encounter: Payer: Self-pay | Admitting: Internal Medicine

## 2011-01-28 ENCOUNTER — Ambulatory Visit (INDEPENDENT_AMBULATORY_CARE_PROVIDER_SITE_OTHER): Admitting: Internal Medicine

## 2011-01-28 DIAGNOSIS — F29 Unspecified psychosis not due to a substance or known physiological condition: Secondary | ICD-10-CM

## 2011-01-28 DIAGNOSIS — I10 Essential (primary) hypertension: Secondary | ICD-10-CM

## 2011-01-28 DIAGNOSIS — R7309 Other abnormal glucose: Secondary | ICD-10-CM

## 2011-01-28 DIAGNOSIS — G4733 Obstructive sleep apnea (adult) (pediatric): Secondary | ICD-10-CM

## 2011-01-28 DIAGNOSIS — J309 Allergic rhinitis, unspecified: Secondary | ICD-10-CM

## 2011-01-28 DIAGNOSIS — F4489 Other dissociative and conversion disorders: Secondary | ICD-10-CM

## 2011-01-28 DIAGNOSIS — R42 Dizziness and giddiness: Secondary | ICD-10-CM

## 2011-01-28 MED ORDER — CLONAZEPAM 1 MG PO TABS: 2.0000 mg | ORAL_TABLET | Freq: Every evening | ORAL | Status: DC | PRN

## 2011-01-28 NOTE — Assessment & Plan Note (Signed)
On RX

## 2011-01-28 NOTE — Assessment & Plan Note (Signed)
BP Readings from Last 3 Encounters:  01/28/11 108/80  12/26/10 122/80  11/26/10 100/70

## 2011-01-28 NOTE — Assessment & Plan Note (Signed)
On Rx 

## 2011-01-28 NOTE — Assessment & Plan Note (Signed)
Doing much better - no re-occurence

## 2011-01-28 NOTE — Telephone Encounter (Signed)
Sleep study abnormal I left msg on pts phone she would need eval by sleep MD I have sent order for this to occur with Dr Elsworth Soho

## 2011-01-28 NOTE — Progress Notes (Signed)
  Subjective:    Patient ID: Jamie [name], female    DOB: 1946-03-18, 65 y.o.   MRN: [id number]  HPI The patient presents for a follow-up of  chronic hypertension, chronic dyslipidemia, type 2 pre-diabetes controlled with medicines F/u night confusion. Possible OSA.     Review of Systems  Constitutional: Negative for chills, activity change, appetite change, fatigue and unexpected weight change.  HENT: Negative for congestion, mouth sores and sinus pressure.   Eyes: Negative for visual disturbance.  Respiratory: Negative for cough and chest tightness.   Gastrointestinal: Negative for nausea and abdominal pain.  Genitourinary: Negative for frequency, difficulty urinating and vaginal pain.  Musculoskeletal: Negative for back pain and gait problem.  Skin: Negative for pallor and rash.  Neurological: Negative for dizziness, tremors, weakness, numbness and headaches.  Psychiatric/Behavioral: Negative for confusion and sleep disturbance.       Objective:   Physical Exam  Constitutional: She appears well-developed. No distress.       Obese  HENT:  Head: Normocephalic.  Right Ear: External ear normal.  Left Ear: External ear normal.  Nose: Nose normal.  Mouth/Throat: Oropharynx is clear and moist.  Eyes: Conjunctivae are normal. Pupils are equal, round, and reactive to light. Right eye exhibits no discharge. Left eye exhibits no discharge.  Neck: Normal range of motion. Neck supple. No JVD present. No tracheal deviation present. No thyromegaly present.  Cardiovascular: Normal rate, regular rhythm and normal heart sounds.   Pulmonary/Chest: No stridor. No respiratory distress. She has no wheezes.  Abdominal: Soft. Bowel sounds are normal. She exhibits no distension and no mass. There is no tenderness. There is no rebound and no guarding.  Musculoskeletal: She exhibits no edema and no tenderness.  Lymphadenopathy:    She has no cervical adenopathy.  Neurological: She displays  normal reflexes. No cranial nerve deficit. She exhibits normal muscle tone. Coordination normal.  Skin: No rash noted. No erythema.  Psychiatric: She has a normal mood and affect. Her behavior is normal. Judgment and thought content normal.          Assessment & Plan:

## 2011-01-29 ENCOUNTER — Encounter: Payer: Self-pay | Admitting: Pulmonary Disease

## 2011-01-30 ENCOUNTER — Ambulatory Visit: Admitting: Critical Care Medicine

## 2011-01-30 NOTE — Telephone Encounter (Signed)
Appointment with Dr. Elsworth Soho has been scheduled for Thurs. 01/31/11 at 11:00 with Dr. Elsworth Soho. Pt has been contacted by the front staff to notify her of this appointment.

## 2011-01-31 ENCOUNTER — Ambulatory Visit (INDEPENDENT_AMBULATORY_CARE_PROVIDER_SITE_OTHER): Admitting: Pulmonary Disease

## 2011-01-31 ENCOUNTER — Encounter: Payer: Self-pay | Admitting: Pulmonary Disease

## 2011-01-31 VITALS — BP 110/62 | HR 60 | Temp 98.1°F | Ht 63.0 in | Wt 221.8 lb

## 2011-01-31 DIAGNOSIS — J984 Other disorders of lung: Secondary | ICD-10-CM

## 2011-01-31 DIAGNOSIS — G4733 Obstructive sleep apnea (adult) (pediatric): Secondary | ICD-10-CM

## 2011-01-31 DIAGNOSIS — G2581 Restless legs syndrome: Secondary | ICD-10-CM

## 2011-01-31 NOTE — Assessment & Plan Note (Signed)
Mild - moderate obstructive sleep apnea INitiate CPAP auto 5-12 cm with small fullface mask Weight loss encouraged, compliance with goal of at least 4-6 hrs every night is the expectation. Advised against medications with sedative side effects Cautioned against driving when sleepy - understanding that sleepiness will vary on a day to day basis More difficult task will be to decrease her sedative meds

## 2011-01-31 NOTE — Progress Notes (Signed)
  Subjective:    Patient ID: Jamie [name], female    DOB: 10-31-1945, 65 y.o.   MRN: [id number]  HPI 65/F with  restrictive lung dz, ? Asthma  and allergic rhinitis for sleep consultation after study  Has fatigue and tiredness. Snoring. No observed apneas per husband. Cat wakes her up during sleep ? apneas Has had three episodes , two at night, one in afternoon. Husband sleeps  in diff room Stop & go traffic , but sleepy on drive to [place] back ESS 14 Bedtime 11p , latency 15 mins, 5-6 arousals, oob at 1000, feels tired, no headaches, dryness + Restless legs , on clonazepam  x 10 yrs CLonazepam, skelaxin & chlordiazepoxide noted on med list PSg showed AHI of 9/h but quite a few RERAs (resp effort related arousals) with RDI > 30/h, minimal desaturation. Few PLMS, took clonazepam on the night of the study      Review of Systems  Constitutional: Negative for fever and unexpected weight change.  HENT: Positive for congestion. Negative for ear pain, nosebleeds, sore throat, rhinorrhea, sneezing, trouble swallowing, dental problem, postnasal drip and sinus pressure.   Eyes: Negative for redness and itching.  Respiratory: Positive for cough and shortness of breath. Negative for chest tightness and wheezing.   Cardiovascular: Negative for palpitations and leg swelling.  Gastrointestinal: Negative for nausea and vomiting.  Genitourinary: Negative for dysuria.  Musculoskeletal: Negative for joint swelling.  Skin: Negative for rash.  Neurological: Negative for headaches.  Hematological: Does not bruise/bleed easily.  Psychiatric/Behavioral: Negative for dysphoric mood. The patient is not nervous/anxious.        Objective:   Physical Exam Gen. Pleasant, well-nourished, in no distress, normal affect ENT - no lesions, no post nasal drip, class 2 airway Neck: No JVD, no thyromegaly, no carotid bruits Lungs: no use of accessory muscles, no dullness to percussion, clear without rales or  rhonchi  Cardiovascular: Rhythm regular, heart sounds  normal, no murmurs or gallops, no peripheral edema Abdomen: soft and non-tender, no hepatosplenomegaly, BS normal. Musculoskeletal: No deformities, no cyanosis or clubbing Neuro:  alert, non focal        Assessment & Plan:

## 2011-01-31 NOTE — Patient Instructions (Signed)
We will set you up with CPAP machine In 1 month, turn in the card to the DME company 2 weeks prior to next appt, trial of requip & decrease your klonopin to 43mg at bedtime

## 2011-01-31 NOTE — Assessment & Plan Note (Signed)
Will trial requip 1 mg q hs Decrease klonopin to 1 mg qhs  - she has been on this for many years , if requip workls plan will be to uptitrate & taper klonopin to off.

## 2011-02-07 ENCOUNTER — Telehealth: Payer: Self-pay | Admitting: Pulmonary Disease

## 2011-02-07 MED ORDER — ROPINIROLE HCL 1 MG PO TABS: 1.0000 mg | ORAL_TABLET | Freq: Every day | ORAL | Status: DC

## 2011-02-07 NOTE — Telephone Encounter (Signed)
Spoke with pt. She states that her requip was never sent to pharm, RA wanted her to start this 2 wks prior to the next ov and wants to go ahead and pick up so she will not forget. Rx was sent to pharm and pt made aware.

## 2011-02-18 ENCOUNTER — Telehealth: Payer: Self-pay

## 2011-02-18 NOTE — Telephone Encounter (Signed)
Patient called c/o sinus congestion, scratchy throat , headache with brown/green mucus. She is requesting antibiotic to help. She was last given Augmentin and it helped better than zpack

## 2011-02-18 NOTE — Telephone Encounter (Signed)
Patient called back and also request a rx for nasonex. Thanks

## 2011-02-19 ENCOUNTER — Encounter: Payer: Self-pay | Admitting: Internal Medicine

## 2011-02-19 ENCOUNTER — Ambulatory Visit (INDEPENDENT_AMBULATORY_CARE_PROVIDER_SITE_OTHER): Admitting: Internal Medicine

## 2011-02-19 DIAGNOSIS — J309 Allergic rhinitis, unspecified: Secondary | ICD-10-CM

## 2011-02-19 DIAGNOSIS — I10 Essential (primary) hypertension: Secondary | ICD-10-CM

## 2011-02-19 DIAGNOSIS — J329 Chronic sinusitis, unspecified: Secondary | ICD-10-CM

## 2011-02-19 MED ORDER — OLMESARTAN MEDOXOMIL 40 MG PO TABS: 40.0000 mg | ORAL_TABLET | Freq: Every day | ORAL | Status: DC

## 2011-02-19 MED ORDER — FLUTICASONE PROPIONATE 50 MCG/ACT NA SUSP: 2.0000 | Freq: Every day | NASAL | Status: DC | PRN

## 2011-02-19 MED ORDER — AMOXICILLIN-POT CLAVULANATE 500-125 MG PO TABS: 1.0000 | ORAL_TABLET | Freq: Three times a day (TID) | ORAL | Status: AC

## 2011-02-19 NOTE — Telephone Encounter (Signed)
Closing phone note , pt called back and scheduled appt

## 2011-02-19 NOTE — Progress Notes (Signed)
Subjective:    Patient ID: Jamie [name], female    DOB: 1945-11-11, 65 y.o.   MRN: [id number]  Sinusitis This is a recurrent problem. The current episode started in the past 7 days. The problem is unchanged. There has been no fever. She is experiencing no pain. Associated symptoms include congestion, sinus pressure, sneezing and a sore throat. Pertinent negatives include no chills, coughing, diaphoresis, ear pain, headaches, hoarse voice, neck pain or shortness of breath. Past treatments include nothing.      Review of Systems  Constitutional: Negative for fever, chills, diaphoresis, activity change, appetite change, fatigue and unexpected weight change.  HENT: Positive for congestion, sore throat, rhinorrhea, sneezing, postnasal drip and sinus pressure. Negative for ear pain, nosebleeds, hoarse voice, facial swelling, mouth sores, trouble swallowing, neck pain, neck stiffness, voice change and ear discharge.   Eyes: Negative.   Respiratory: Negative for apnea, cough, choking, chest tightness, shortness of breath, wheezing and stridor.   Cardiovascular: Negative for chest pain, palpitations and leg swelling.  Gastrointestinal: Negative for nausea, vomiting, abdominal pain, diarrhea, constipation, blood in stool and abdominal distention.  Genitourinary: Negative for dysuria, urgency, frequency, hematuria, flank pain, decreased urine volume, enuresis, difficulty urinating and dyspareunia.  Musculoskeletal: Negative for myalgias, back pain, joint swelling, arthralgias and gait problem.  Skin: Negative for color change, pallor, rash and wound.  Neurological: Negative for dizziness, tremors, seizures, syncope, facial asymmetry, speech difficulty, weakness, light-headedness, numbness and headaches.  Hematological: Negative for adenopathy. Does not bruise/bleed easily.  Psychiatric/Behavioral: Negative.        Objective:   Physical Exam  Vitals reviewed. Constitutional: She is oriented to  person, place, and time. She appears well-developed and well-nourished. No distress.  HENT:  Head: Normocephalic and atraumatic.  Right Ear: Hearing, tympanic membrane, external ear and ear canal normal.  Left Ear: Hearing, tympanic membrane, external ear and ear canal normal.  Nose: Mucosal edema and rhinorrhea present. No nose lacerations, sinus tenderness, nasal deformity, septal deviation or nasal septal hematoma. No epistaxis.  No foreign bodies. Right sinus exhibits maxillary sinus tenderness. Right sinus exhibits no frontal sinus tenderness. Left sinus exhibits no maxillary sinus tenderness and no frontal sinus tenderness.  Eyes: Conjunctivae and EOM are normal. Pupils are equal, round, and reactive to light. Right eye exhibits no discharge. Left eye exhibits no discharge. No scleral icterus.  Neck: Normal range of motion. Neck supple. No JVD present. No tracheal deviation present. No thyromegaly present.  Cardiovascular: Normal rate, regular rhythm, normal heart sounds and intact distal pulses.  Exam reveals no gallop and no friction rub.   No murmur heard. Pulmonary/Chest: Effort normal and breath sounds normal. No stridor. No respiratory distress. She has no wheezes. She has no rales. She exhibits no tenderness.  Abdominal: Soft. Bowel sounds are normal. She exhibits no distension and no mass. There is no tenderness. There is no rebound and no guarding.  Musculoskeletal: Normal range of motion. She exhibits no edema and no tenderness.  Lymphadenopathy:    She has no cervical adenopathy.  Neurological: She is alert and oriented to person, place, and time. She has normal reflexes. She displays normal reflexes. No cranial nerve deficit. She exhibits normal muscle tone. Coordination normal.  Skin: Skin is warm and dry. No rash noted. She is not diaphoretic. No erythema. No pallor.  Psychiatric: She has a normal mood and affect. Her behavior is normal. Judgment and thought content normal.           Assessment & Plan:

## 2011-02-19 NOTE — Patient Instructions (Signed)

## 2011-02-20 NOTE — Assessment & Plan Note (Signed)
She will restart flonase ns

## 2011-02-20 NOTE — Assessment & Plan Note (Signed)
It sounds like she has an acute on chronic sinus infection so I have started her on augmentin

## 2011-02-27 ENCOUNTER — Encounter: Payer: Self-pay | Admitting: Pulmonary Disease

## 2011-02-27 ENCOUNTER — Ambulatory Visit: Admitting: Internal Medicine

## 2011-03-14 ENCOUNTER — Ambulatory Visit (INDEPENDENT_AMBULATORY_CARE_PROVIDER_SITE_OTHER): Admitting: Pulmonary Disease

## 2011-03-14 ENCOUNTER — Encounter: Payer: Self-pay | Admitting: Pulmonary Disease

## 2011-03-14 VITALS — BP 112/64 | HR 63 | Temp 98.0°F | Ht 63.0 in | Wt 218.0 lb

## 2011-03-14 DIAGNOSIS — G2581 Restless legs syndrome: Secondary | ICD-10-CM

## 2011-03-14 DIAGNOSIS — Z23 Encounter for immunization: Secondary | ICD-10-CM

## 2011-03-14 DIAGNOSIS — G4733 Obstructive sleep apnea (adult) (pediatric): Secondary | ICD-10-CM

## 2011-03-14 DIAGNOSIS — J984 Other disorders of lung: Secondary | ICD-10-CM

## 2011-03-14 NOTE — Progress Notes (Signed)
  Subjective:    Patient ID: Jamie [name], female    DOB: 12-29-1945, 65 y.o.   MRN: [id number]  HPI 65/F with restrictive lung dz, ? Asthma and allergic rhinitis for FU of moderate OSA Has fatigue and tiredness. Snoring. No observed apneas per husband. Cat wakes her up during sleep ? apneas  Has had three episodes , two at night, one in afternoon. Husband sleeps in diff room  Stop & go traffic , but sleepy on drive to [place] back  ESS 14  Bedtime 11p , latency 15 mins, 5-6 arousals, oob at 1000, feels tired, no headaches, dryness +  Restless legs , on clonazepam x 10 yrs  CLonazepam, skelaxin & chlordiazepoxide noted on med list  PSg showed AHI of 9/h but quite a few RERAs (resp effort related arousals) with RDI > 30/h, minimal desaturation.  Few PLMS, took clonazepam on the night of the study  03/14/2011 Trial of requip 52m  - stopped due to nightmares & chest cramps, she is  Back on klonopin 2 mg She has had good results with CPAP, fullf ace mask,better rested, no daytime naps Download on cpap 5-12 shows no residual events, excellent compliance >8h, avg pr 12 cm, leak ++ Lost 5lbs   Review of Systems Patient denies significant dyspnea,cough, hemoptysis,  chest pain, palpitations, pedal edema, orthopnea, paroxysmal nocturnal dyspnea, lightheadedness, nausea, vomiting, abdominal or  leg pains      Objective:   Physical Exam  Gen. Pleasant, well-nourished, in no distress ENT - no lesions, no post nasal drip, class 2 airway Neck: No JVD, no thyromegaly, no carotid bruits Lungs: no use of accessory muscles, no dullness to percussion, clear without rales or rhonchi  Cardiovascular: Rhythm regular, heart sounds  normal, no murmurs or gallops, no peripheral edema Musculoskeletal: No deformities, no cyanosis or clubbing        Assessment & Plan:

## 2011-03-14 NOTE — Patient Instructions (Signed)
We will review download & make changes to your cpap machine Keep using this at least 6 hours every night Weight loss advised

## 2011-03-14 NOTE — Assessment & Plan Note (Signed)
PSg showed AHI of 9/h but quite a few RERAs (resp effort related arousals) with RDI > 30/h, minimal desaturation. Good compliance on CPAP download 9/12 Change to fixed pr 12 cm Trial of nasal mask with chin strap  Tos ee if she can obtain better seal & decrease leak Weight loss encouraged, compliance with goal of at least 4-6 hrs every night is the expectation. Advised against medications with sedative side effects Cautioned against driving when sleepy - understanding that sleepiness will vary on a day to day basis

## 2011-03-15 NOTE — Progress Notes (Signed)
Addended by: Miki Kins on: 03/15/2011 01:52 PM   Modules accepted: Orders

## 2011-03-18 ENCOUNTER — Encounter: Payer: Self-pay | Admitting: Pulmonary Disease

## 2011-04-03 LAB — CBC
HCT: 37.7
Platelets: 231
WBC: 7

## 2011-04-03 LAB — DIFFERENTIAL
Basophils Relative: 0
Eosinophils Relative: 1
Monocytes Absolute: 0.4
Monocytes Relative: 6
Neutro Abs: 4.9

## 2011-04-03 LAB — POCT I-STAT, CHEM 8
Chloride: 104
Creatinine, Ser: 0.8
HCT: 40
Hemoglobin: 13.6
Potassium: 3.9
Sodium: 139

## 2011-04-03 LAB — POCT CARDIAC MARKERS
CKMB, poc: 1.1
Myoglobin, poc: 60.2
Myoglobin, poc: 72.4

## 2011-04-18 ENCOUNTER — Encounter: Payer: Self-pay | Admitting: Pulmonary Disease

## 2011-04-18 ENCOUNTER — Ambulatory Visit (INDEPENDENT_AMBULATORY_CARE_PROVIDER_SITE_OTHER): Admitting: Pulmonary Disease

## 2011-04-18 VITALS — BP 126/76 | HR 89 | Temp 98.7°F | Ht 63.0 in | Wt 226.4 lb

## 2011-04-18 DIAGNOSIS — G4733 Obstructive sleep apnea (adult) (pediatric): Secondary | ICD-10-CM

## 2011-04-18 NOTE — Progress Notes (Signed)
  Subjective:    Patient ID: Jamie [name], female    DOB: 25-Dec-1945, 65 y.o.   MRN: [id number]  HPI 65/F with restrictive lung dz, ? Asthma and allergic rhinitis for FU of moderate OSA  Restless legs , on clonazepam x 10 yrs  CLonazepam, skelaxin & chlordiazepoxide noted on med list  PSg showed AHI of 9/h but quite a few RERAs (resp effort related arousals) with RDI > 30/h, minimal desaturation. Few PLMS, took clonazepam on the night of the study   03/14/2011  Trial of requip 62m - stopped due to nightmares & chest cramps, she is Back on klonopin 2 mg  She has had good results with CPAP, fullf ace mask,better rested, no daytime naps  Download on cpap 5-12 shows no residual events, excellent compliance >8h, avg pr 12 cm, leak ++   >> changed to 12 cm, trial of nasal mask + chin strap  04/18/2011 Episode last night where patient was dazed with mental confusion and  slow slurred speach last night .had a gin martini 1.5 h prior, O2 satn 91, HR 91,given albuterol neb - satn improved to 98%, then was placed on cpap for 2 mins and seem to come around. came off cpap and sxs  came back  . Also felt nauseous an very unsteady. Had not taken meds prior - took after this episode & then slept through the night. Husband has detailed record of this event & emphasizes that recovery with CPAP was almost immediate. NO focal neuro deficits  Of note, similar episodes int he past 4-5 times - note CT head negative in 5/12 for episode of confusion  Review of Systems Patient denies significant dyspnea,cough, hemoptysis,  chest pain, palpitations, pedal edema, orthopnea, paroxysmal nocturnal dyspnea, lightheadedness, nausea, vomiting, abdominal or  leg pains      Objective:   Physical Exam  Gen. Pleasant, obese, in no distress, normal affect ENT - no lesions, no post nasal drip, class 2-3 airway Neck: No JVD, no thyromegaly, no carotid bruits Lungs: no use of accessory muscles, no dullness to percussion,  decreased without rales or rhonchi  Cardiovascular: Rhythm regular, heart sounds  normal, no murmurs or gallops, no peripheral edema Abdomen: soft and non-tender, no hepatosplenomegaly, BS normal. Musculoskeletal: No deformities, no cyanosis or clubbing Neuro:  alert, non focal, power 5/5, no tremors, nystagmus, or cerebellar signs       Assessment & Plan:

## 2011-04-18 NOTE — Assessment & Plan Note (Signed)
12 cm appears to be adequate Immediate benefit from CPAP makes me wonder about transient hypercarbia meds + alcohol that could have responded. Doubt upper airway issues, she is concerned about allergic cause of these episodes. Detailed neuro exam is non focal - yield of CT scan would be low. Of note CT in 5/12 has ruled out other focal causes such as tumor - no cerebellar signs to pursue MRI - I offered her neuro consult - but she would like to defer to PCP. I again emphasised need to cut down doses of sedative meds / DO not combine with alcohol. - librax, klonopin, skelaxin She will pursue this conversation with dr Alain Marion

## 2011-04-18 NOTE — Patient Instructions (Signed)
Neurologist opinion May be an interaction between medications & alcohol Try redusing klonopin 1 1/2 tabs until youse Dr plotnikov Call him/ me if this happens again

## 2011-04-25 ENCOUNTER — Encounter: Payer: Self-pay | Admitting: Internal Medicine

## 2011-04-25 ENCOUNTER — Ambulatory Visit (INDEPENDENT_AMBULATORY_CARE_PROVIDER_SITE_OTHER): Admitting: Internal Medicine

## 2011-04-25 VITALS — BP 110/72 | HR 68 | Temp 98.8°F | Ht 64.0 in

## 2011-04-25 DIAGNOSIS — J309 Allergic rhinitis, unspecified: Secondary | ICD-10-CM

## 2011-04-25 DIAGNOSIS — H6693 Otitis media, unspecified, bilateral: Secondary | ICD-10-CM | POA: Insufficient documentation

## 2011-04-25 DIAGNOSIS — H6690 Otitis media, unspecified, unspecified ear: Secondary | ICD-10-CM

## 2011-04-25 DIAGNOSIS — I10 Essential (primary) hypertension: Secondary | ICD-10-CM

## 2011-04-25 MED ORDER — AZITHROMYCIN 250 MG PO TABS: ORAL_TABLET | ORAL | Status: AC

## 2011-04-25 NOTE — Assessment & Plan Note (Signed)
Mild to mod, for antibx course,  to f/u any worsening symptoms or concerns 

## 2011-04-25 NOTE — Patient Instructions (Addendum)
Take all new medications as prescribed - the antibiotic Continue all other medications as before, including antihistamiine and nasal steroid you have at home You can also take  Mucinex (or it's generic off brand) for congestion

## 2011-04-25 NOTE — Progress Notes (Signed)
Subjective:    Patient ID: Jamie [name], female    DOB: October 13, 1945, 65 y.o.   MRN: [id number]  HPI  Here to f/u wih acute; c/o vertigo with head position change to turn the head to the left despite meclzine and the hall-pike maneuver for about 1 wk; worse after shopping with moving the up and down, back and forth with significant nausea; seems worse to lie down and turn on left side;  Does have several wks ongoing nasal allergy symptoms with clear congestion, itch and sneeze, without fever, pain, ST, cough or wheezing but overall not as bad ash has been in the past. Has felt "warM" off and on but no temp by thermometer at home.  Currently, head does feel "tight" with light HA in the past 2 days, that she o/w would not c/o, though has had some right eye d/c.  No ST, cough.  Pt denies chest pain, increased sob or doe, wheezing, orthopnea, PND, increased LE swelling, palpitations, dizziness or syncope, since last seen per Dr Elsworth Soho recently.  Did have mild pain to the right ear intemittent mild yesterday.   Pt denies fever, wt loss, night sweats, loss of appetite, or other constitutional symptoms Past Medical History  Diagnosis Date  . Asthma   . GERD (gastroesophageal reflux disease)   . Hypertension   . Obesity   . Allergic rhinitis    Past Surgical History  Procedure Date  . Abdominal hysterectomy 1979  . Tonsillectomy   . Breast reduction surgery Jan 1991    reports that she has never smoked. She has never used smokeless tobacco. She reports that she drinks about 1.8 ounces of alcohol per week. She reports that she does not use illicit drugs. family history includes Allergies in her brother and daughter; Cancer in her cousin and daughter; Cancer (age of onset:63) in her father; Heart disease in her brother; Heart disease (age of onset:42) in her father; Hypertension in her other; and Lung cancer in her father.  There is no history of Asthma. Allergies  Allergen Reactions  . Hydrocodone  Anaphylaxis    REACTION: jerking  . Aspirin     REACTION: ulcers  . Moxifloxacin     Tachycardia    Current Outpatient Prescriptions on File Prior to Visit  Medication Sig Dispense Refill  . albuterol (PROAIR HFA) 108 (90 BASE) MCG/ACT inhaler Inhale 2 puffs into the lungs every 4 (four) hours as needed. For SOB       . Cholecalciferol (EQL VITAMIN D3) 1000 UNITS tablet Occasionally      . clidinium-chlordiazePOXIDE (LIBRAX) 2.5-5 MG per capsule Take 1 capsule by mouth at bedtime.       . clonazePAM (KLONOPIN) 1 MG tablet Take 2 tablets (2 mg total) by mouth at bedtime as needed for anxiety. 2 tablets po qhs  180 tablet  1  . fluticasone (FLONASE) 50 MCG/ACT nasal spray Place 2 sprays into the nose daily as needed.  16 g  11  . lansoprazole (PREVACID) 30 MG capsule Take 30 mg by mouth 2 (two) times daily.        . meclizine (ANTIVERT) 12.5 MG tablet 1-2 po q 6 hours prn for dizziness       . metaxalone (SKELAXIN) 800 MG tablet 2 tablets at bedtime      . metoprolol (TOPROL-XL) 100 MG 24 hr tablet Take 100 mg by mouth daily.        Marland Kitchen olmesartan (BENICAR) 40 MG tablet Take 1 tablet (  40 mg total) by mouth daily.  30 tablet  11  . PRENATAL VITAMINS PO Take 1 tablet by mouth daily.        Marland Kitchen spironolactone (ALDACTONE) 50 MG tablet Take 50 mg by mouth daily.         Review of Systems Review of Systems  Constitutional: Negative for diaphoresis and unexpected weight change.  HENT: Negative for drooling and tinnitus.   Eyes: Negative for photophobia and visual disturbance.  Respiratory: Negative for choking and stridor.   Gastrointestinal: Negative for vomiting and blood in stool.  Genitourinary: Negative for hematuria and decreased urine volume.      Objective:   Physical Exam BP 110/72  Pulse 68  Temp(Src) 98.8 F (37.1 C) (Oral)  Ht 5' 4"  (1.626 m)  SpO2 93%  Physical Exam  VS noted, mild ill appearing Constitutional: Pt appears well-developed and well-nourished.  HENT: Head:  Normocephalic.  Right Ear: External ear normal.  Left Ear: External ear normal.  Bilat tm's mod erythema.  Sinus nontender.  Pharynx mild erythema Eyes: Conjunctivae and EOM are normal. Pupils are equal, round, and reactive to light.  Neck: Normal range of motion. Neck supple.  Cardiovascular: Normal rate and regular rhythm.   Pulmonary/Chest: Effort normal and breath sounds normal.  Neurological: Pt is alert. No cranial nerve deficit. motor/dtr/gait intact Skin: Skin is warm. No erythema.  Psychiatric: Pt behavior is normal. Thought content normal.     Assessment & Plan:

## 2011-04-25 NOTE — Assessment & Plan Note (Signed)
For otc allegra, adn to re-start her nasal steroid she has at home, and mucinex otc prn

## 2011-04-26 NOTE — Assessment & Plan Note (Signed)
stable overall by hx and exam, most recent data reviewed with pt, and pt to continue medical treatment as before  BP Readings from Last 3 Encounters:  04/25/11 110/72  04/18/11 126/76  03/14/11 112/64

## 2011-05-03 ENCOUNTER — Other Ambulatory Visit (INDEPENDENT_AMBULATORY_CARE_PROVIDER_SITE_OTHER)

## 2011-05-03 DIAGNOSIS — F29 Unspecified psychosis not due to a substance or known physiological condition: Secondary | ICD-10-CM

## 2011-05-03 DIAGNOSIS — F4489 Other dissociative and conversion disorders: Secondary | ICD-10-CM

## 2011-05-03 DIAGNOSIS — R42 Dizziness and giddiness: Secondary | ICD-10-CM

## 2011-05-03 DIAGNOSIS — R7309 Other abnormal glucose: Secondary | ICD-10-CM

## 2011-05-03 DIAGNOSIS — I10 Essential (primary) hypertension: Secondary | ICD-10-CM

## 2011-05-03 DIAGNOSIS — J309 Allergic rhinitis, unspecified: Secondary | ICD-10-CM

## 2011-05-03 LAB — CBC WITH DIFFERENTIAL/PLATELET
Basophils Relative: 0.4 % (ref 0.0–3.0)
Eosinophils Relative: 1.8 % (ref 0.0–5.0)
HCT: 37.6 % (ref 36.0–46.0)
Hemoglobin: 12.8 g/dL (ref 12.0–15.0)
Lymphs Abs: 1.4 10*3/uL (ref 0.7–4.0)
MCV: 94.3 fl (ref 78.0–100.0)
Monocytes Absolute: 0.3 10*3/uL (ref 0.1–1.0)
RBC: 3.99 Mil/uL (ref 3.87–5.11)
WBC: 5.3 10*3/uL (ref 4.5–10.5)

## 2011-05-03 LAB — COMPREHENSIVE METABOLIC PANEL
Alkaline Phosphatase: 48 U/L (ref 39–117)
BUN: 18 mg/dL (ref 6–23)
Glucose, Bld: 102 mg/dL — ABNORMAL HIGH (ref 70–99)
Sodium: 142 mEq/L (ref 135–145)
Total Bilirubin: 0.7 mg/dL (ref 0.3–1.2)

## 2011-05-03 LAB — VITAMIN B12: Vitamin B-12: 647 pg/mL (ref 211–911)

## 2011-05-07 ENCOUNTER — Ambulatory Visit (INDEPENDENT_AMBULATORY_CARE_PROVIDER_SITE_OTHER): Admitting: Internal Medicine

## 2011-05-07 ENCOUNTER — Encounter: Payer: Self-pay | Admitting: Internal Medicine

## 2011-05-07 DIAGNOSIS — Z23 Encounter for immunization: Secondary | ICD-10-CM

## 2011-05-07 DIAGNOSIS — I10 Essential (primary) hypertension: Secondary | ICD-10-CM

## 2011-05-07 DIAGNOSIS — M25511 Pain in right shoulder: Secondary | ICD-10-CM | POA: Insufficient documentation

## 2011-05-07 DIAGNOSIS — L719 Rosacea, unspecified: Secondary | ICD-10-CM

## 2011-05-07 DIAGNOSIS — J309 Allergic rhinitis, unspecified: Secondary | ICD-10-CM

## 2011-05-07 DIAGNOSIS — M25519 Pain in unspecified shoulder: Secondary | ICD-10-CM

## 2011-05-07 DIAGNOSIS — Z2911 Encounter for prophylactic immunotherapy for respiratory syncytial virus (RSV): Secondary | ICD-10-CM

## 2011-05-07 MED ORDER — OLMESARTAN MEDOXOMIL 40 MG PO TABS: 40.0000 mg | ORAL_TABLET | Freq: Every day | ORAL | Status: DC

## 2011-05-07 MED ORDER — METRONIDAZOLE 1 % EX GEL: Freq: Every day | CUTANEOUS | Status: DC

## 2011-05-07 MED ORDER — METOPROLOL SUCCINATE ER 100 MG PO TB24: 100.0000 mg | ORAL_TABLET | Freq: Every day | ORAL | Status: DC

## 2011-05-07 MED ORDER — FLUTICASONE PROPIONATE 50 MCG/ACT NA SUSP: 2.0000 | Freq: Every day | NASAL | Status: DC | PRN

## 2011-05-07 MED ORDER — SPIRONOLACTONE 50 MG PO TABS: 50.0000 mg | ORAL_TABLET | Freq: Every day | ORAL | Status: DC

## 2011-05-07 MED ORDER — CLONAZEPAM 1 MG PO TABS: 1.0000 mg | ORAL_TABLET | Freq: Every evening | ORAL | Status: DC | PRN

## 2011-05-07 MED ORDER — METAXALONE 800 MG PO TABS: 800.0000 mg | ORAL_TABLET | Freq: Every evening | ORAL | Status: DC | PRN

## 2011-05-07 NOTE — Assessment & Plan Note (Signed)
Injected per her request

## 2011-05-07 NOTE — Progress Notes (Signed)
Subjective:    Patient ID: Jamie [name], female    DOB: December 25, 1945, 65 y.o.   MRN: [id number]  HPI  The patient presents for a follow-up of  chronic hypertension, chronic dyslipidemia, asthma controlled with medicines Episode last night where patient was dazed with mental confusion and slow slurred speach at night on 10/11 had a gin martini 1.5 h prior, O2 satn 91, HR 91,given albuterol neb - satn improved to 98%, then was placed on cpap for 2 mins and seem to come around. came off cpap and sxs came back . Also felt nauseous an very unsteady. Had not taken meds prior - took after this episode & then slept through the night. Husband has detailed record of this event & emphasizes that recovery with CPAP was almost immediate. NO focal neuro deficits.  Wt Readings from Last 3 Encounters:  05/07/11 222 lb (100.699 kg)  04/18/11 226 lb 6.4 oz (102.694 kg)  03/14/11 218 lb (98.884 kg)   C/o severe R shoulder pain x months after she dropped a box w/Chr decorations  Review of Systems  Constitutional: Negative for chills, activity change, appetite change, fatigue and unexpected weight change.  HENT: Negative for congestion, mouth sores and sinus pressure.   Eyes: Negative for visual disturbance.  Respiratory: Negative for cough and chest tightness.   Gastrointestinal: Negative for nausea and abdominal pain.  Genitourinary: Negative for frequency, difficulty urinating and vaginal pain.  Musculoskeletal: Negative for back pain and gait problem.  Skin: Negative for pallor and rash.  Neurological: Negative for dizziness, tremors, weakness, numbness and headaches.  Psychiatric/Behavioral: Negative for confusion and sleep disturbance.       Objective:   Physical Exam  Constitutional: She appears well-developed. No distress.       obese  HENT:  Head: Normocephalic.  Right Ear: External ear normal.  Left Ear: External ear normal.  Nose: Nose normal.  Mouth/Throat: Oropharynx is clear and  moist.  Eyes: Conjunctivae are normal. Pupils are equal, round, and reactive to light. Right eye exhibits no discharge. Left eye exhibits no discharge.  Neck: Normal range of motion. Neck supple. No JVD present. No tracheal deviation present. No thyromegaly present.  Cardiovascular: Normal rate, regular rhythm and normal heart sounds.   Pulmonary/Chest: No stridor. No respiratory distress. She has no wheezes.  Abdominal: Soft. Bowel sounds are normal. She exhibits no distension and no mass. There is no tenderness. There is no rebound and no guarding.  Musculoskeletal: She exhibits no edema and no tenderness.  Lymphadenopathy:    She has no cervical adenopathy.  Neurological: She displays normal reflexes. No cranial nerve deficit. She exhibits normal muscle tone. Coordination normal.  Skin: Rash (rosacea) noted. No erythema.  Psychiatric: Her behavior is normal. Judgment and thought content normal.   R shoulder is tender Lab Results  Component Value Date   WBC 5.3 05/03/2011   HGB 12.8 05/03/2011   HCT 37.6 05/03/2011   PLT 219.0 05/03/2011   GLUCOSE 102* 05/03/2011   CHOL 168 08/24/2010   TRIG 85.0 08/24/2010   HDL 65.40 08/24/2010   LDLCALC 86 08/24/2010   ALT 84* 05/03/2011   AST 43* 05/03/2011   NA 142 05/03/2011   K 4.6 05/03/2011   CL 109 05/03/2011   CREATININE 0.8 05/03/2011   BUN 18 05/03/2011   CO2 26 05/03/2011   TSH 1.12 05/03/2011   HGBA1C 5.7 05/03/2011     Procedure :Joint Injection,   shoulder   Indication:  Subacromial bursitis with refractory  chronic pain.   Risks including unsuccessful procedure , bleeding, infection, bruising, skin atrophy and others were explained to the patient in detail as well as the benefits. Informed consent was obtained and signed.   Tthe patient was placed in a comfortable position. Lateral approach was used. Skin was prepped with Betadine and alcohol. Then, a 5 cc syringe with a 2 inch long 24-gauge needle was used for a joint  injection.. The needle was advanced  Into the subacromial space.The bursa was injected with 3 mL of 2% lidocaine and 40 mg of Depo-Medrol .  Band-Aid was applied.   Tolerated well. Complications: None. Good pain relief following the procedure.   Postprocedure instructions :    A Band-Aid should be left on for 12 hours. Injection therapy is not a cure itself. It is used in conjunction with other modalities. You can use nonsteroidal anti-inflammatories like ibuprofen , hot and cold compresses. Rest is recommended in the next 24 hours. You need to report immediately  if fever, chills or any signs of infection develop.       Assessment & Plan:

## 2011-05-07 NOTE — Patient Instructions (Signed)
Rosacea Can try Black kohosh or Soy tablets for hot flashes

## 2011-06-09 ENCOUNTER — Telehealth: Payer: Self-pay | Admitting: Pulmonary Disease

## 2011-06-09 NOTE — Telephone Encounter (Signed)
Download 9/3-10/15/12 >> no resdiual events on CPAP 13 cm, good compliance No changes

## 2011-06-10 NOTE — Telephone Encounter (Signed)
lmomtcb x1 

## 2011-06-10 NOTE — Telephone Encounter (Signed)
I informed pt of RA's findings and recommendations. Pt verbalized understanding  

## 2011-06-17 ENCOUNTER — Encounter: Payer: Self-pay | Admitting: Pulmonary Disease

## 2011-06-26 ENCOUNTER — Telehealth: Payer: Self-pay | Admitting: Internal Medicine

## 2011-06-26 NOTE — Telephone Encounter (Signed)
The pt has a appt for tomorrow , but is hoping to get a cough [facility] called [facility] to hold her over till then.  The husband has asked for [name] to call him back letting him know if this is possible or not.   Thanks!!

## 2011-06-27 ENCOUNTER — Ambulatory Visit (INDEPENDENT_AMBULATORY_CARE_PROVIDER_SITE_OTHER)
Admission: RE | Admit: 2011-06-27 | Discharge: 2011-06-27 | Disposition: A | Discharge status: Home / Self Care | Source: Ambulatory Visit | Attending: Internal Medicine | Admitting: Internal Medicine

## 2011-06-27 ENCOUNTER — Encounter: Payer: Self-pay | Admitting: Internal Medicine

## 2011-06-27 ENCOUNTER — Ambulatory Visit (INDEPENDENT_AMBULATORY_CARE_PROVIDER_SITE_OTHER): Admitting: Internal Medicine

## 2011-06-27 VITALS — BP 102/58 | HR 69 | Temp 100.0°F

## 2011-06-27 DIAGNOSIS — R7309 Other abnormal glucose: Secondary | ICD-10-CM

## 2011-06-27 DIAGNOSIS — J209 Acute bronchitis, unspecified: Secondary | ICD-10-CM

## 2011-06-27 DIAGNOSIS — R062 Wheezing: Secondary | ICD-10-CM

## 2011-06-27 DIAGNOSIS — I10 Essential (primary) hypertension: Secondary | ICD-10-CM

## 2011-06-27 MED ORDER — PROMETHAZINE-CODEINE 6.25-10 MG/5ML PO SYRP: 5.0000 mL | ORAL_SOLUTION | ORAL | Status: DC | PRN

## 2011-06-27 MED ORDER — LEVOFLOXACIN 250 MG PO TABS: 250.0000 mg | ORAL_TABLET | Freq: Every day | ORAL | Status: AC

## 2011-06-27 MED ORDER — GUAIFENESIN-CODEINE 100-10 MG/5ML PO SOLN: 5.0000 mL | Freq: Three times a day (TID) | ORAL | Status: AC | PRN

## 2011-06-27 MED ORDER — PREDNISONE 10 MG PO TABS: 10.0000 mg | ORAL_TABLET | Freq: Every day | ORAL | Status: DC

## 2011-06-27 NOTE — Assessment & Plan Note (Signed)
Mild to mod, for predpack  course,  to f/u any worsening symptoms or concerns 

## 2011-06-27 NOTE — Assessment & Plan Note (Signed)
stable overall by hx and exam, most recent data reviewed with pt, and pt to continue medical treatment as before Lab Results  Component Value Date   HGBA1C 5.7 05/03/2011   Pt to call for onset polys or cbg > 200

## 2011-06-27 NOTE — Assessment & Plan Note (Signed)
Mild to mod, for antibx course,  to f/u any worsening symptoms or concerns, cant r/o pna - for cxr as well

## 2011-06-27 NOTE — Telephone Encounter (Signed)
OK Prom/cod if she can take codeine Thx

## 2011-06-27 NOTE — Patient Instructions (Signed)
Take all new medications as prescribed Continue all other medications as before Please go to XRAY in the Basement for the x-ray test Please call the phone number [phone] (the [facility]) for results of testing in 2-3 days;  When calling, simply dial the number, and when prompted enter the MRN number above (the Medical Record Number) and the # key, then the message should start.

## 2011-06-27 NOTE — Progress Notes (Signed)
Subjective:    Patient ID: Jamie [name], female    DOB: 04/22/1946, 65 y.o.   MRN: [id number]  HPI  Here with acute onset mild to mod 2-3 days ST, HA, fever to 102.7 yesterday, general weakness and malaise, with prod cough greenish bloody sputum, but Pt denies chest pain, increased sob or doe, wheezing, orthopnea, PND, increased LE swelling, palpitations, dizziness or syncope until today with mild onset wheezing, sob.  Pt denies new neurological symptoms such as new headache, or facial or extremity weakness or numbness   Pt denies polydipsia, polyuria. Past Medical History  Diagnosis Date  . Asthma   . GERD (gastroesophageal reflux disease)   . Hypertension   . Obesity   . Allergic rhinitis    Past Surgical History  Procedure Date  . Abdominal hysterectomy 1979  . Tonsillectomy   . Breast reduction surgery Jan 1991    reports that she has never smoked. She has never used smokeless tobacco. She reports that she drinks about 1.8 ounces of alcohol per week. She reports that she does not use illicit drugs. family history includes Allergies in her brother and daughter; Cancer in her cousin and daughter; Cancer (age of onset:63) in her father; Heart disease in her brother; Heart disease (age of onset:42) in her father; Hypertension in her other; and Lung cancer in her father.  There is no history of Asthma. Allergies  Allergen Reactions  . Hydrocodone Anaphylaxis    REACTION: jerking  . Aspirin     REACTION: ulcers  . Moxifloxacin     Tachycardia    Current Outpatient Prescriptions on File Prior to Visit  Medication Sig Dispense Refill  . albuterol (PROAIR HFA) 108 (90 BASE) MCG/ACT inhaler Inhale 2 puffs into the lungs every 4 (four) hours as needed. For SOB       . Cholecalciferol (EQL VITAMIN D3) 1000 UNITS tablet Occasionally      . clidinium-chlordiazePOXIDE (LIBRAX) 2.5-5 MG per capsule Take 1 capsule by mouth at bedtime.       . clonazePAM (KLONOPIN) 1 MG tablet Take 1 tablet  (1 mg total) by mouth at bedtime as needed.  90 tablet  1  . fluticasone (FLONASE) 50 MCG/ACT nasal spray Place 2 sprays into the nose daily as needed.  48 g  3  . lansoprazole (PREVACID) 30 MG capsule Take 30 mg by mouth 2 (two) times daily.        . meclizine (ANTIVERT) 12.5 MG tablet 1-2 po q 6 hours prn for dizziness       . metaxalone (SKELAXIN) 800 MG tablet Take 1 tablet (800 mg total) by mouth at bedtime and may repeat dose one time if needed.  90 tablet  3  . metoprolol (TOPROL-XL) 100 MG 24 hr tablet Take 1 tablet (100 mg total) by mouth daily.  90 tablet  3  . metroNIDAZOLE (METROGEL) 1 % gel Apply topically daily.  45 g  3  . olmesartan (BENICAR) 40 MG tablet Take 1 tablet (40 mg total) by mouth daily.  90 tablet  3  . PRENATAL VITAMINS PO Take 1 tablet by mouth daily.        Marland Kitchen spironolactone (ALDACTONE) 50 MG tablet Take 1 tablet (50 mg total) by mouth daily.  90 tablet  3  . promethazine-codeine (PHENERGAN WITH CODEINE) 6.25-10 MG/5ML syrup Take 5 mLs by mouth every 4 (four) hours as needed for cough.  240 mL  0   Review of Systems Review of Systems  Constitutional: Negative for diaphoresis and unexpected weight change.  HENT: Negative for drooling and tinnitus.   Eyes: Negative for photophobia and visual disturbance.  Respiratory: Negative for choking and stridor.   Gastrointestinal: Negative for vomiting and blood in stool.  Genitourinary: Negative for hematuria and decreased urine volume.    Objective:   Physical Exam BP 102/58  Pulse 69  Temp(Src) 100 F (37.8 C) (Oral) Physical Exam  VS noted, mild ill Constitutional: Pt appears well-developed and well-nourished.  HENT: Head: Normocephalic.  Right Ear: External ear normal.  Left Ear: External ear normal.  Bilat tm's mild erythema.  Sinus nontender.  Pharynx mild erythema Eyes: Conjunctivae and EOM are normal. Pupils are equal, round, and reactive to light.  Neck: Normal range of motion. Neck supple.    Cardiovascular: Normal rate and regular rhythm.   Pulmonary/Chest: Effort normal and breath sounds mild decreased with bilat mild wheeze  Neurological: Pt is alert. No cranial nerve deficit.  Skin: Skin is warm. No erythema.  Psychiatric: Pt behavior is normal. Thought content normal.     Assessment & Plan:

## 2011-06-27 NOTE — Assessment & Plan Note (Signed)
BP Readings from Last 3 Encounters:  06/27/11 102/58  05/07/11 128/82  04/25/11 110/72   stable overall by hx and exam, most recent data reviewed with pt, and pt to continue medical treatment as before

## 2011-06-28 ENCOUNTER — Telehealth: Payer: Self-pay

## 2011-06-28 NOTE — Telephone Encounter (Signed)
[facility] Triage Call Report Triage Record Num: [id number] Operator: Ashok Croon Patient Name: Jamie [name] Call Date & Time: 06/28/2011 1:23:26AM Patient Phone: [phone] PCP: Walker Kehr Patient Gender: Female PCP Fax : [phone] Patient DOB: 1945/11/11 Practice Name: [facility] Reason for Call: Caller: Jamie [name]/Patient; PCP: Walker Kehr; CB#: [phone]; Call Reason: Sore Throat; Sx Onset: 05/25/2011; Sx Notes: ; Temp:102.0 Oral at 23:00; Wt: ; Home treatment(s) tried: Ibuprofen, Dr. Jenny Reichmann gave abx. & prednisone today; Did home treatment help?: No; Guideline Used: ; Disp:; Appt Scheduled?: Callback and pt refused Triage. Patient "states, I'm through for the night, husband says patient has been able to fall asleep." Protocol(s) Used: Office Note Recommended Outcome per Protocol: Information Noted and Sent to Office Reason for Outcome: Caller information to office Care Advice: ~ 06/28/2011 1:26:58AM Page 1 of 1 [id number]

## 2011-06-28 NOTE — Telephone Encounter (Signed)
Pt was given written Rx at 06-27-11 OV by Dr. Jenny Reichmann.

## 2011-07-09 DIAGNOSIS — C649 Malignant neoplasm of unspecified kidney, except renal pelvis: Secondary | ICD-10-CM

## 2011-07-09 HISTORY — DX: Malignant neoplasm of unspecified kidney, except renal pelvis: C64.9

## 2011-07-29 ENCOUNTER — Ambulatory Visit (INDEPENDENT_AMBULATORY_CARE_PROVIDER_SITE_OTHER): Admitting: Endocrinology

## 2011-07-29 ENCOUNTER — Encounter: Payer: Self-pay | Admitting: Endocrinology

## 2011-07-29 VITALS — BP 106/68 | HR 70 | Temp 99.1°F | Ht 64.0 in | Wt 224.0 lb

## 2011-07-29 DIAGNOSIS — J069 Acute upper respiratory infection, unspecified: Secondary | ICD-10-CM

## 2011-07-29 MED ORDER — PROMETHAZINE-CODEINE 6.25-10 MG/5ML PO SYRP: 5.0000 mL | ORAL_SOLUTION | ORAL | Status: DC | PRN

## 2011-07-29 MED ORDER — CEFUROXIME AXETIL 250 MG PO TABS: 250.0000 mg | ORAL_TABLET | Freq: Two times a day (BID) | ORAL | Status: DC

## 2011-07-29 NOTE — Patient Instructions (Addendum)
i have sent a prescription to your pharmacy, for an antibiotic. Loratadine-d (non-prescription) will help your congestion. I hope you feel better soon.  If you don't feel better by next week, please call back. Here is a prescription for cough syrup.   You should not take this cough syrup along with librax or clonazepam.

## 2011-07-29 NOTE — Progress Notes (Signed)
Subjective:    Patient ID: Jamie [name], female    DOB: 10/12/1945, 66 y.o.   MRN: [id number]  HPI Pt states 1 month of slight swelling of both eyes, and assoc sore throat Past Medical History  Diagnosis Date  . Asthma   . GERD (gastroesophageal reflux disease)   . Hypertension   . Obesity   . Allergic rhinitis     Past Surgical History  Procedure Date  . Abdominal hysterectomy 1979  . Tonsillectomy   . Breast reduction surgery Jan 1991    History   Social History  . Marital Status: Married    Spouse Name: N/A    Number of Children: 2  . Years of Education: N/A   Occupational History  . Retired Economist   Social History Main Topics  . Smoking status: Never Smoker   . Smokeless tobacco: Never Used  . Alcohol Use: 1.8 oz/week    3 Glasses of wine per week     daily>>>counselled to reduce ETOH use   . Drug Use: No  . Sexually Active: Yes    Birth Control/ Protection: Surgical   Other Topics Concern  . Not on file   Social History Narrative  . No narrative on file    Current Outpatient Prescriptions on File Prior to Visit  Medication Sig Dispense Refill  . albuterol (PROAIR HFA) 108 (90 BASE) MCG/ACT inhaler Inhale 2 puffs into the lungs every 4 (four) hours as needed. For SOB       . Cholecalciferol (EQL VITAMIN D3) 1000 UNITS tablet Occasionally      . clidinium-chlordiazePOXIDE (LIBRAX) 2.5-5 MG per capsule Take 1 capsule by mouth at bedtime.       . clonazePAM (KLONOPIN) 1 MG tablet Take 1 tablet (1 mg total) by mouth at bedtime as needed.  90 tablet  1  . fluticasone (FLONASE) 50 MCG/ACT nasal spray Place 2 sprays into the nose daily as needed.  48 g  3  . lansoprazole (PREVACID) 30 MG capsule Take 30 mg by mouth 2 (two) times daily.        . meclizine (ANTIVERT) 12.5 MG tablet 1-2 po q 6 hours prn for dizziness       . metaxalone (SKELAXIN) 800 MG tablet Take 1 tablet (800 mg total) by mouth at bedtime and may repeat dose one time if  needed.  90 tablet  3  . metoprolol (TOPROL-XL) 100 MG 24 hr tablet Take 1 tablet (100 mg total) by mouth daily.  90 tablet  3  . metroNIDAZOLE (METROGEL) 1 % gel Apply topically daily.  45 g  3  . olmesartan (BENICAR) 40 MG tablet Take 1 tablet (40 mg total) by mouth daily.  90 tablet  3  . PRENATAL VITAMINS PO Take 1 tablet by mouth daily.        Marland Kitchen spironolactone (ALDACTONE) 50 MG tablet Take 1 tablet (50 mg total) by mouth daily.  90 tablet  3    Allergies  Allergen Reactions  . Hydrocodone Anaphylaxis    REACTION: jerking  . Aspirin     REACTION: ulcers  . Moxifloxacin     Tachycardia     Family History  Problem Relation Age of Onset  . Heart disease Father 42  . Lung cancer Father   . Cancer Father 37    lung ca  . Heart disease Brother   . Asthma Neg Hx   . Hypertension Other   .  Cancer Cousin     colon  . Allergies Daughter   . Allergies Brother   . Cancer Daughter     malignant melanoma    BP 106/68  Pulse 70  Temp(Src) 99.1 F (37.3 C) (Oral)  Ht 5' 4"  (1.626 m)  Wt 224 lb (101.606 kg)  BMI 38.45 kg/m2  SpO2 96%    Review of Systems She has a slight prod cough.  Fever is resolved.     Objective:   Physical Exam VITAL SIGNS:  See vs page GENERAL: no distress head: no deformity eyes: there is slight periorbital swelling, but no proptosis external nose and ears are normal mouth: no lesion seen tympanic membranes are both slightly red NECK: There is no palpable thyroid enlargement.  No thyroid nodule is palpable.  No palpable lymphadenopathy at the anterior neck. LUNGS:  Clear to auscultation       Assessment & Plan:  URI, new Anxiety.  There is a risk of drug-drug interaction between cough syrup and anti-anxiety meds.

## 2011-08-01 ENCOUNTER — Other Ambulatory Visit: Payer: Self-pay | Admitting: Internal Medicine

## 2011-08-05 ENCOUNTER — Encounter: Payer: Self-pay | Admitting: Endocrinology

## 2011-08-05 ENCOUNTER — Ambulatory Visit (INDEPENDENT_AMBULATORY_CARE_PROVIDER_SITE_OTHER): Admitting: Endocrinology

## 2011-08-05 ENCOUNTER — Telehealth: Payer: Self-pay | Admitting: Internal Medicine

## 2011-08-05 VITALS — BP 114/76 | HR 66 | Temp 97.7°F | Ht 64.0 in | Wt 224.4 lb

## 2011-08-05 DIAGNOSIS — J069 Acute upper respiratory infection, unspecified: Secondary | ICD-10-CM

## 2011-08-05 MED ORDER — DOXYCYCLINE HYCLATE 100 MG PO TABS: 100.0000 mg | ORAL_TABLET | Freq: Two times a day (BID) | ORAL | Status: AC

## 2011-08-05 MED ORDER — PROMETHAZINE-CODEINE 6.25-10 MG/5ML PO SYRP: 5.0000 mL | ORAL_SOLUTION | ORAL | Status: AC | PRN

## 2011-08-05 NOTE — Patient Instructions (Addendum)
i have sent a prescription to your pharmacy, for a different antibiotic. I hope you feel better soon.  If you don't feel better by next week, please call back.   Loratadine-d (non-prescription) will help your congestion.   Here is a refill of your cough syrup.

## 2011-08-05 NOTE — Telephone Encounter (Signed)
OK. Thx

## 2011-08-05 NOTE — Telephone Encounter (Signed)
The patient called and stated she has been sick for one month with fatigue, cough, congestion, and swollen face and neck.  Can she be worked in this afternoon or tomorrow (1/29) morning?  Thanks!

## 2011-08-05 NOTE — Progress Notes (Signed)
Subjective:    Patient ID: Jamie [name], female    DOB: 1946-06-24, 66 y.o.   MRN: [id number]  HPI Pt was seen here last week for uri.  Cough is much better, but she still has fever (101), nasal congestion, fatigue, bilat otalgia, myalgias, and sore throat.   Past Medical History  Diagnosis Date  . Asthma   . GERD (gastroesophageal reflux disease)   . Hypertension   . Obesity   . Allergic rhinitis     Past Surgical History  Procedure Date  . Abdominal hysterectomy 1979  . Tonsillectomy   . Breast reduction surgery Jan 1991    History   Social History  . Marital Status: Married    Spouse Name: N/A    Number of Children: 2  . Years of Education: N/A   Occupational History  . Retired Economist   Social History Main Topics  . Smoking status: Never Smoker   . Smokeless tobacco: Never Used  . Alcohol Use: 1.8 oz/week    3 Glasses of wine per week     daily>>>counselled to reduce ETOH use   . Drug Use: No  . Sexually Active: Yes    Birth Control/ Protection: Surgical   Other Topics Concern  . Not on file   Social History Narrative  . No narrative on file    Current Outpatient Prescriptions on File Prior to Visit  Medication Sig Dispense Refill  . albuterol (PROAIR HFA) 108 (90 BASE) MCG/ACT inhaler Inhale 2 puffs into the lungs every 4 (four) hours as needed. For SOB       . Cholecalciferol (EQL VITAMIN D3) 1000 UNITS tablet Occasionally      . clidinium-chlordiazePOXIDE (LIBRAX) 2.5-5 MG per capsule Take 1 capsule by mouth at bedtime.       . clonazePAM (KLONOPIN) 1 MG tablet Take 1 tablet (1 mg total) by mouth at bedtime as needed.  90 tablet  1  . fluticasone (FLONASE) 50 MCG/ACT nasal spray Place 2 sprays into the nose daily as needed.  48 g  3  . lansoprazole (PREVACID) 30 MG capsule Take 30 mg by mouth 2 (two) times daily.        . meclizine (ANTIVERT) 12.5 MG tablet 1-2 po q 6 hours prn for dizziness       . metaxalone (SKELAXIN) 800 MG  tablet Take 1 tablet (800 mg total) by mouth at bedtime and may repeat dose one time if needed.  90 tablet  3  . metoprolol (TOPROL-XL) 100 MG 24 hr tablet Take 1 tablet (100 mg total) by mouth daily.  90 tablet  3  . metroNIDAZOLE (METROGEL) 1 % gel Apply topically daily.  45 g  3  . olmesartan (BENICAR) 40 MG tablet Take 1 tablet (40 mg total) by mouth daily.  90 tablet  3  . PREMARIN 0.3 MG tablet TAKE 1 TABLET BY MOUTH ONCE DAILY ALTERNATING WITH 0.625MG TABLET  45 tablet  2  . PRENATAL VITAMINS PO Take 1 tablet by mouth daily.        Marland Kitchen spironolactone (ALDACTONE) 50 MG tablet Take 1 tablet (50 mg total) by mouth daily.  90 tablet  3    Allergies  Allergen Reactions  . Hydrocodone Anaphylaxis    REACTION: jerking  . Aspirin     REACTION: ulcers  . Moxifloxacin     Tachycardia     Family History  Problem Relation Age of Onset  . Heart  disease Father 5  . Lung cancer Father   . Cancer Father 42    lung ca  . Heart disease Brother   . Asthma Neg Hx   . Hypertension Other   . Cancer Cousin     colon  . Allergies Daughter   . Allergies Brother   . Cancer Daughter     malignant melanoma    BP 114/76  Pulse 66  Temp(Src) 97.7 F (36.5 C) (Oral)  Ht 5' 4"  (1.626 m)  Wt 224 lb 6.4 oz (101.787 kg)  BMI 38.52 kg/m2  SpO2 96%  Review of Systems He has slight nausea    Objective:   Physical Exam VITAL SIGNS:  See vs page. GENERAL: no distress. head: no deformity. eyes: no periorbital swelling, no proptosis.   external nose and ears are normal.   mouth: no lesion seen. Both tm's are slightly red.      Assessment & Plan:  URI, persistent

## 2011-08-06 NOTE — Telephone Encounter (Signed)
The pt stated she no longer wanted appt, she called yesterday (1/28) afternoon and saw Dr.Ellison.

## 2011-08-06 NOTE — Telephone Encounter (Signed)
Called pt's home and cell number, no answer.

## 2011-08-12 ENCOUNTER — Telehealth: Payer: Self-pay | Admitting: Internal Medicine

## 2011-08-12 NOTE — Telephone Encounter (Signed)
PT WANTS TO BE REFERRED TO DR JEFF ROSEN, ENT.  THAT OFFICE WILL NEED OFFICE NOTES FROM LAST SEVERAL OV'S.

## 2011-08-12 NOTE — Telephone Encounter (Signed)
Ok Thx 

## 2011-09-11 ENCOUNTER — Ambulatory Visit: Admitting: Internal Medicine

## 2011-09-20 ENCOUNTER — Ambulatory Visit (INDEPENDENT_AMBULATORY_CARE_PROVIDER_SITE_OTHER): Admitting: Internal Medicine

## 2011-09-20 ENCOUNTER — Encounter: Payer: Self-pay | Admitting: Internal Medicine

## 2011-09-20 VITALS — BP 110/80 | HR 88 | Temp 97.9°F | Resp 16 | Wt 222.0 lb

## 2011-09-20 DIAGNOSIS — M79609 Pain in unspecified limb: Secondary | ICD-10-CM

## 2011-09-20 DIAGNOSIS — F29 Unspecified psychosis not due to a substance or known physiological condition: Secondary | ICD-10-CM

## 2011-09-20 DIAGNOSIS — F4489 Other dissociative and conversion disorders: Secondary | ICD-10-CM

## 2011-09-20 DIAGNOSIS — M79606 Pain in leg, unspecified: Secondary | ICD-10-CM | POA: Insufficient documentation

## 2011-09-20 DIAGNOSIS — I10 Essential (primary) hypertension: Secondary | ICD-10-CM

## 2011-09-20 MED ORDER — CLONAZEPAM 1 MG PO TABS: 1.0000 mg | ORAL_TABLET | Freq: Every evening | ORAL | Status: DC | PRN

## 2011-09-20 MED ORDER — IBUPROFEN 600 MG PO TABS: ORAL_TABLET | ORAL | Status: AC

## 2011-09-20 MED ORDER — METAXALONE 800 MG PO TABS: 800.0000 mg | ORAL_TABLET | Freq: Every evening | ORAL | Status: DC | PRN

## 2011-09-20 NOTE — Assessment & Plan Note (Addendum)
  3/13 LLE and occ LBP -- no sx's of radiculopathy x 2 mo L troch bursitis NSAID  Ice  May inject

## 2011-09-20 NOTE — Patient Instructions (Signed)
  L trochanteric bursitis Hip stretching, hip opener -- [email or web address]

## 2011-09-20 NOTE — Progress Notes (Signed)
Patient ID: Jamie [name], female   DOB: [birth year], 66 y.o.   MRN: [id number]  Subjective:    Patient ID: Jamie [name], female    DOB: [birth year], 65 y.o.   MRN: [id number]  Leg Pain  Pertinent negatives include no numbness.  Back Pain Associated symptoms include leg pain. Pertinent negatives include no abdominal pain, headaches, numbness or weakness.   C/o  LLE and occ LBP -- 6/10 worse w/sitting x 2 mo The patient presents for a follow-up of  chronic hypertension, chronic dyslipidemia, asthma controlled with medicines Episode last night where patient was dazed with mental confusion and slow slurred speach at night on 10/11 had a gin martini 1.5 h prior, O2 satn 91, HR 91,given albuterol neb - satn improved to 98%, then was placed on cpap for 2 mins and seem to come around. came off cpap and sxs came back . Also felt nauseous an very unsteady. Had not taken meds prior - took after this episode & then slept through the night. Husband has detailed record of this event & emphasizes that recovery with CPAP was almost immediate. NO focal neuro deficits.  Wt Readings from Last 3 Encounters:  09/20/11 222 lb (100.699 kg)  08/05/11 224 lb 6.4 oz (101.787 kg)  07/29/11 224 lb (101.606 kg)   BP Readings from Last 3 Encounters:  09/20/11 110/80  08/05/11 114/76  07/29/11 106/68    C/o severe R shoulder pain x months after she dropped a box w/Chr decorations  Review of Systems  Constitutional: Negative for chills, activity change, appetite change, fatigue and unexpected weight change.  HENT: Negative for congestion, mouth sores and sinus pressure.   Eyes: Negative for visual disturbance.  Respiratory: Negative for cough and chest tightness.   Gastrointestinal: Negative for nausea and abdominal pain.  Genitourinary: Negative for frequency, difficulty urinating and vaginal pain.  Musculoskeletal: Positive for back pain. Negative for gait problem.  Skin: Negative for pallor and rash.    Neurological: Negative for dizziness, tremors, weakness, numbness and headaches.  Psychiatric/Behavioral: Negative for confusion and sleep disturbance.       Objective:   Physical Exam  Constitutional: She appears well-developed. No distress.       obese  HENT:  Head: Normocephalic.  Right Ear: External ear normal.  Left Ear: External ear normal.  Nose: Nose normal.  Mouth/Throat: Oropharynx is clear and moist.  Eyes: Conjunctivae are normal. Pupils are equal, round, and reactive to light. Right eye exhibits no discharge. Left eye exhibits no discharge.  Neck: Normal range of motion. Neck supple. No JVD present. No tracheal deviation present. No thyromegaly present.  Cardiovascular: Normal rate, regular rhythm and normal heart sounds.   Pulmonary/Chest: No stridor. No respiratory distress. She has no wheezes.  Abdominal: Soft. Bowel sounds are normal. She exhibits no distension and no mass. There is no tenderness. There is no rebound and no guarding.  Musculoskeletal: She exhibits no edema and no tenderness.  Lymphadenopathy:    She has no cervical adenopathy.  Neurological: She displays normal reflexes. No cranial nerve deficit. She exhibits normal muscle tone. Coordination normal.  Skin: Rash (rosacea) noted. No erythema.  Psychiatric: Her behavior is normal. Judgment and thought content normal.   R shoulder is tender Lab Results  Component Value Date   WBC 5.3 05/03/2011   HGB 12.8 05/03/2011   HCT 37.6 05/03/2011   PLT 219.0 05/03/2011   GLUCOSE 102* 05/03/2011   CHOL 168 08/24/2010   TRIG 85.0 08/24/2010  HDL 65.40 08/24/2010   LDLCALC 86 08/24/2010   ALT 84* 05/03/2011   AST 43* 05/03/2011   NA 142 05/03/2011   K 4.6 05/03/2011   CL 109 05/03/2011   CREATININE 0.8 05/03/2011   BUN 18 05/03/2011   CO2 26 05/03/2011   TSH 1.12 05/03/2011   HGBA1C 5.7 05/03/2011     Procedure :Joint Injection,   shoulder   Indication:  Subacromial bursitis with refractory   chronic pain.   Risks including unsuccessful procedure , bleeding, infection, bruising, skin atrophy and others were explained to the patient in detail as well as the benefits. Informed consent was obtained and signed.   Tthe patient was placed in a comfortable position. Lateral approach was used. Skin was prepped with Betadine and alcohol. Then, a 5 cc syringe with a 2 inch long 24-gauge needle was used for a joint injection.. The needle was advanced  Into the subacromial space.The bursa was injected with 3 mL of 2% lidocaine and 40 mg of Depo-Medrol .  Band-Aid was applied.   Tolerated well. Complications: None. Good pain relief following the procedure.   Postprocedure instructions :    A Band-Aid should be left on for 12 hours. Injection therapy is not a cure itself. It is used in conjunction with other modalities. You can use nonsteroidal anti-inflammatories like ibuprofen , hot and cold compresses. Rest is recommended in the next 24 hours. You need to report immediately  if fever, chills or any signs of infection develop.       Assessment & Plan:

## 2011-09-22 NOTE — Assessment & Plan Note (Signed)
No relapse 

## 2011-09-22 NOTE — Assessment & Plan Note (Signed)
Continue with current prescription therapy as reflected on the Med list.  

## 2011-09-23 ENCOUNTER — Encounter: Payer: Self-pay | Admitting: Internal Medicine

## 2011-09-23 ENCOUNTER — Ambulatory Visit (INDEPENDENT_AMBULATORY_CARE_PROVIDER_SITE_OTHER): Admitting: Internal Medicine

## 2011-09-23 VITALS — BP 120/78 | HR 64 | Ht 64.0 in | Wt 228.6 lb

## 2011-09-23 DIAGNOSIS — K7689 Other specified diseases of liver: Secondary | ICD-10-CM

## 2011-09-23 DIAGNOSIS — K589 Irritable bowel syndrome without diarrhea: Secondary | ICD-10-CM

## 2011-09-23 DIAGNOSIS — K760 Fatty (change of) liver, not elsewhere classified: Secondary | ICD-10-CM

## 2011-09-23 DIAGNOSIS — E669 Obesity, unspecified: Secondary | ICD-10-CM

## 2011-09-23 DIAGNOSIS — K219 Gastro-esophageal reflux disease without esophagitis: Secondary | ICD-10-CM

## 2011-09-23 DIAGNOSIS — Z860100 Personal history of colon polyps, unspecified: Secondary | ICD-10-CM

## 2011-09-23 MED ORDER — PEG-KCL-NACL-NASULF-NA ASC-C 100 G PO SOLR: 1.0000 | Freq: Once | ORAL | Status: DC

## 2011-09-23 MED ORDER — ESOMEPRAZOLE MAGNESIUM 40 MG PO CPDR: 40.0000 mg | DELAYED_RELEASE_CAPSULE | Freq: Every day | ORAL | Status: DC

## 2011-09-23 MED ORDER — CILIDINIUM-CHLORDIAZEPOXIDE 2.5-5 MG PO CAPS: 1.0000 | ORAL_CAPSULE | Freq: Every day | ORAL | Status: DC

## 2011-09-23 NOTE — Patient Instructions (Signed)
You have been scheduled for a colonoscopy with propofol. Please follow written instructions given to you at your visit today.  Please pick up your prep kit at the pharmacy within the next 1-3 days.

## 2011-09-23 NOTE — Progress Notes (Signed)
HISTORY OF PRESENT ILLNESS:  Jamie [name] is a 66 y.o. female with hypertension, morbid obesity, asthma, irritable bowel syndrome, fatty liver disease, GERD, and adenomatous colon polyps. She presents today for routine GI followup. For her GERD, she takes Prevacid 30 mg twice a day. 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 She wishes to switch to Nexium (which her husband takes). She denies dysphagia. Next, she takes Librax once at night for previously diagnosed irritable bowel. She denies any abdominal symptoms consistent with irritable bowel. GI review of systems is otherwise negative. Review of laboratories from October 2012 reveal persistently elevated liver function tests (transaminases less than 100). Normal CBC including platelets. She does have a history of adenomatous polyps. Last colonoscopy 5 years ago. She does not tolerate moderate sedation.  REVIEW OF SYSTEMS:  All non-GI ROS negative except for Urinary leakage, back pain, sinus allergy trouble, heart murmur  Past Medical History  Diagnosis Date  . Asthma   . GERD (gastroesophageal reflux disease)   . Hypertension   . Obesity   . Allergic rhinitis   . IBS (irritable bowel syndrome)   . Fatty liver disease, nonalcoholic   . Adenomatous colon polyp     Past Surgical History  Procedure Date  . Abdominal hysterectomy 1979  . Tonsillectomy   . Breast reduction surgery Jan 1991    Social History Jamie [name]  reports that she has never smoked. She has never used smokeless tobacco. She reports that she drinks about 1.8 ounces of alcohol per week. She reports that she does not use illicit drugs.  family history includes Allergies in her brother and daughter; Cancer in her cousin and daughter; Cancer (age of onset:63) in her father; Heart disease in her brother; Heart disease (age of onset:42) in her father; Hypertension in her other; and Lung cancer in her father.  There is no history of  Asthma.  Allergies  Allergen Reactions  . Hydrocodone Anaphylaxis    REACTION: jerking  . Aspirin     REACTION: ulcers  . Moxifloxacin     Tachycardia        PHYSICAL EXAMINATION: Vital signs: BP 120/78  Pulse 64  Ht 5' 4"  (1.626 m)  Wt 228 lb 9.6 oz (103.692 kg)  BMI 39.24 kg/m2  Constitutional:obese, generally well-appearing, no acute distress Psychiatric: alert and oriented x3, cooperative Eyes: extraocular movements intact, anicteric, conjunctiva pink Mouth: oral pharynx moist, no lesions Neck: supple no lymphadenopathy Cardiovascular: heart regular rate and rhythm Lungs: clear to auscultation bilaterally Abdomen:obese,soft, nontender, nondistended, no obvious ascites, no peritoneal signs, normal bowel sounds, no organomegaly Rectal:deferred until colonoscopy Extremities: no lower extremity edema bilaterally Skin: no lesions on visible extremities Neuro: No focal deficits.   ASSESSMENT:  #1. GERD. Some breakthrough symptoms with twice daily lansoprazole #2. Morbid obesity #3. Fatty liver #4. History of adenomatous polyps. Due for followup   PLAN:  #1. Reflux precautions with attention to weight loss #2. Change from lansoprazole to Nexium 40 mg twice a day #3. Weight loss and exercise for fatty liver #4. Surveillance colonoscopy. Movi prep prescribed. The patient instructed on its use. CRNA monitored propofol for sedation.The nature of the procedure, as well as the risks, benefits, and alternatives were carefully and thoroughly reviewed with the patient. Ample time for discussion and questions allowed. The patient understood, was satisfied, and agreed to proceed.

## 2011-09-26 ENCOUNTER — Telehealth: Payer: Self-pay | Admitting: Internal Medicine

## 2011-09-26 MED ORDER — ESOMEPRAZOLE MAGNESIUM 40 MG PO CPDR: 40.0000 mg | DELAYED_RELEASE_CAPSULE | Freq: Every day | ORAL | Status: DC

## 2011-09-26 MED ORDER — CILIDINIUM-CHLORDIAZEPOXIDE 2.5-5 MG PO CAPS: 1.0000 | ORAL_CAPSULE | Freq: Every day | ORAL | Status: DC

## 2011-09-26 NOTE — Telephone Encounter (Signed)
Pts husband states that the librax and the nexium [facility]

## 2011-10-03 ENCOUNTER — Other Ambulatory Visit: Payer: Self-pay

## 2011-10-07 MED ORDER — ESOMEPRAZOLE MAGNESIUM 40 MG PO CPDR: 40.0000 mg | DELAYED_RELEASE_CAPSULE | Freq: Every day | ORAL | Status: DC

## 2011-10-07 NOTE — Progress Notes (Signed)
Addended by: Audrea Muscat on: 10/07/2011 09:27 AM   Modules accepted: Orders

## 2011-10-22 ENCOUNTER — Telehealth: Payer: Self-pay | Admitting: Internal Medicine

## 2011-10-22 NOTE — Telephone Encounter (Signed)
We an try Wed add-on or Thursday ie 7:30 am Thx

## 2011-10-22 NOTE — Telephone Encounter (Signed)
The pt is hoping to get a cortisone shot in her left hip joint.  She stated she did not have any other concerns except for this.  Can she be worked in before Thursday?  Thanks!

## 2011-10-24 ENCOUNTER — Ambulatory Visit (INDEPENDENT_AMBULATORY_CARE_PROVIDER_SITE_OTHER): Admitting: Internal Medicine

## 2011-10-24 VITALS — BP 120/80 | HR 84 | Temp 98.2°F | Resp 16 | Wt 226.0 lb

## 2011-10-24 DIAGNOSIS — R7309 Other abnormal glucose: Secondary | ICD-10-CM

## 2011-10-24 DIAGNOSIS — M79606 Pain in leg, unspecified: Secondary | ICD-10-CM

## 2011-10-24 DIAGNOSIS — M79609 Pain in unspecified limb: Secondary | ICD-10-CM

## 2011-10-24 DIAGNOSIS — R739 Hyperglycemia, unspecified: Secondary | ICD-10-CM

## 2011-10-24 MED ORDER — METHYLPREDNISOLONE ACETATE 80 MG/ML IJ SUSP: 80.0000 mg | Freq: Once | INTRAMUSCULAR | Status: DC

## 2011-10-24 NOTE — Patient Instructions (Signed)
Postprocedure instructions :    A Band-Aid should be left on for 12 hours. Injection therapy is not a cure itself. It is used in conjunction with other modalities. You can use nonsteroidal anti-inflammatories like ibuprofen , hot and cold compresses. Rest is recommended in the next 24 hours. You need to report immediately  if fever, chills or any signs of infection develop. 

## 2011-10-24 NOTE — Assessment & Plan Note (Signed)
3/13 LLE and occ LBP -- no sx's of radiculopathy x 2 mo L troch bursitis  Injected today

## 2011-10-24 NOTE — Progress Notes (Signed)
  Subjective:    Patient ID: Jamie [name], female    DOB: 1945/07/19, 65 y.o.   MRN: [id number]  HPI  L hip injection  Review of Systems     Objective:   Physical Exam    Procedure Note :     Procedure : Joint Injection,  L  hip   Indication:  Trochanteric bursitis with refractory  chronic pain.   Risks including unsuccessful procedure , bleeding, infection, bruising, skin atrophy and others were explained to the patient in detail as well as the benefits. Informed consent was obtained and signed.   Tthe patient was placed in a comfortable lateral decubitus position. The point of maximal tenderness was identified. Skin was prepped with Betadine and alcohol. Then, a 5 cc syringe with a 2 inch long 24-gauge needle was used for a bursa injection.. The needle was advanced  Into the bursa. I injected the bursa with 4 mL of 2% lidocaine and 80 mg of Depo-Medrol .  Band-Aid was applied.   Tolerated well. Complications: None. Good pain relief following the procedure.   Postprocedure instructions :    A Band-Aid should be left on for 12 hours. Injection therapy is not a cure itself. It is used in conjunction with other modalities. You can use nonsteroidal anti-inflammatories like ibuprofen , hot and cold compresses. Rest is recommended in the next 24 hours. You need to report immediately  if fever, chills or any signs of infection develop.       Assessment & Plan:

## 2011-11-07 ENCOUNTER — Telehealth: Payer: Self-pay

## 2011-11-07 ENCOUNTER — Other Ambulatory Visit: Payer: Self-pay

## 2011-11-07 NOTE — Telephone Encounter (Signed)
communicated scheduling error to patient - rescheduled colon to new date and told pt I would mail her new instructions; pt will sign new acknowledgement and bring it by

## 2011-11-28 ENCOUNTER — Other Ambulatory Visit: Payer: Self-pay

## 2011-11-28 ENCOUNTER — Telehealth: Payer: Self-pay | Admitting: Internal Medicine

## 2011-11-28 DIAGNOSIS — J309 Allergic rhinitis, unspecified: Secondary | ICD-10-CM

## 2011-11-28 MED ORDER — ESOMEPRAZOLE MAGNESIUM 40 MG PO CPDR: 40.0000 mg | DELAYED_RELEASE_CAPSULE | Freq: Every day | ORAL | Status: DC

## 2011-11-29 ENCOUNTER — Telehealth: Payer: Self-pay | Admitting: Internal Medicine

## 2011-11-29 ENCOUNTER — Telehealth: Payer: Self-pay

## 2011-11-29 NOTE — Telephone Encounter (Signed)
spoke with pharmacist and straightened out rx - corrected to 1 tab bid; called pt and told her about correction and that i would leave samples up front to help her until her shipment arrived; pt agreed

## 2011-11-29 NOTE — Telephone Encounter (Signed)
Left message

## 2011-12-04 NOTE — Telephone Encounter (Signed)
See note from 11/29/2011

## 2011-12-09 ENCOUNTER — Ambulatory Visit (AMBULATORY_SURGERY_CENTER): Admitting: Internal Medicine

## 2011-12-09 ENCOUNTER — Encounter: Payer: Self-pay | Admitting: Internal Medicine

## 2011-12-09 VITALS — BP 146/77 | HR 63 | Temp 97.7°F | Resp 18

## 2011-12-09 DIAGNOSIS — Z1211 Encounter for screening for malignant neoplasm of colon: Secondary | ICD-10-CM

## 2011-12-09 DIAGNOSIS — Z86010 Personal history of colonic polyps: Secondary | ICD-10-CM

## 2011-12-09 DIAGNOSIS — D126 Benign neoplasm of colon, unspecified: Secondary | ICD-10-CM

## 2011-12-09 MED ORDER — SODIUM CHLORIDE 0.9 % IV SOLN: 500.0000 mL | INTRAVENOUS | Status: DC

## 2011-12-09 NOTE — Patient Instructions (Signed)

## 2011-12-09 NOTE — Progress Notes (Signed)
Patient did not experience any of the following events: a burn prior to discharge; a fall within the facility; wrong site/side/patient/procedure/implant event; or a hospital transfer or hospital admission upon discharge from the facility. (G8907) Patient did not have preoperative order for IV antibiotic SSI prophylaxis. (G8918)  

## 2011-12-09 NOTE — Op Note (Signed)
[facility]. [place], [place]  [id number]  COLONOSCOPY PROCEDURE REPORT  PATIENT:  Jamie [name], Jamie [name]  MR#:  [id number] BIRTHDATE:  Apr 11, 1946, 64 yrs. old  GENDER:  female ENDOSCOPIST:  Docia Chuck. Jamie Seminole, MD REF. BY:  Surveillance Program Recall, PROCEDURE DATE:  12/09/2011 PROCEDURE:  Colonoscopy with snare polypectomy x 3 ASA CLASS:  Class II INDICATIONS:  history of pre-cancerous (adenomatous) colon polyps, surveillance and high-risk screening ; last exam 2008 MEDICATIONS:   MAC sedation, administered by CRNA, propofol (Diprivan) 270 mg IV  DESCRIPTION OF PROCEDURE:   After the risks benefits and alternatives of the procedure were thoroughly explained, informed consent was obtained.  Digital rectal exam was performed and revealed no abnormalities.   The LB CF-[id number] endoscope was introduced through the anus and advanced to the cecum, which was identified by both the appendix and ileocecal valve, without limitations.  The quality of the prep was excellent, using MoviPrep.  The instrument was then slowly withdrawn as the colon was fully examined. <<PROCEDUREIMAGES>>  FINDINGS:  Three polyps were found in the ascending (58m) and descending (477m4mm) colon. Polyps were snared without cautery. Retrieval was successful.  Mild diverticulosis was found in the left colon.  Otherwise normal colonoscopy without other polyps, masses, vascular ectasias, or inflammatory changes.   Retroflexed views in the rectum revealed internal hemorrhoids.    The time to cecum =  2:53  minutes. The scope was then withdrawn in  10:58 minutes from the cecum and the procedure completed.  COMPLICATIONS:  None  ENDOSCOPIC IMPRESSION: 1) Three polyps in the  colon - removed 2) Mild diverticulosis in the left colon 3) Otherwise normal colonoscopy  RECOMMENDATIONS: 1) Follow up colonoscopy in 5 years  ______________________________ JoDocia ChuckPeGeri SeminoleMD  CC:  AlAltamese Woodstock Plotnikov, MD;  The Patient  n. eSIGNED:   JoDocia ChuckPeGeri Seminolet 12/09/2011 03:27 PM  MoCarolynn [phone]

## 2011-12-10 ENCOUNTER — Telehealth: Payer: Self-pay | Admitting: *Deleted

## 2011-12-10 NOTE — Telephone Encounter (Signed)
  Follow up Call-  Call back number 12/09/2011  Post procedure Call Back phone  # [phone]  Permission to leave phone message Yes     Patient questions:  Do you have a fever, pain , or abdominal swelling? no Pain Score  0 *  Have you tolerated food without any problems? yes  Have you been able to return to your normal activities? yes  Do you have any questions about your discharge instructions: Diet   no Medications  no Follow up visit  no  Do you have questions or concerns about your Care? no  Actions: * If pain score is 4 or above: No action needed, pain <4.

## 2011-12-12 ENCOUNTER — Encounter: Payer: Self-pay | Admitting: Internal Medicine

## 2011-12-16 ENCOUNTER — Ambulatory Visit (INDEPENDENT_AMBULATORY_CARE_PROVIDER_SITE_OTHER): Admitting: Internal Medicine

## 2011-12-16 ENCOUNTER — Encounter: Payer: Self-pay | Admitting: Internal Medicine

## 2011-12-16 ENCOUNTER — Other Ambulatory Visit (INDEPENDENT_AMBULATORY_CARE_PROVIDER_SITE_OTHER)

## 2011-12-16 VITALS — BP 130/72 | HR 80 | Temp 98.1°F | Resp 16 | Wt 225.0 lb

## 2011-12-16 DIAGNOSIS — R739 Hyperglycemia, unspecified: Secondary | ICD-10-CM

## 2011-12-16 DIAGNOSIS — F4489 Other dissociative and conversion disorders: Secondary | ICD-10-CM

## 2011-12-16 DIAGNOSIS — I10 Essential (primary) hypertension: Secondary | ICD-10-CM

## 2011-12-16 DIAGNOSIS — M79606 Pain in leg, unspecified: Secondary | ICD-10-CM

## 2011-12-16 DIAGNOSIS — R7309 Other abnormal glucose: Secondary | ICD-10-CM

## 2011-12-16 DIAGNOSIS — H04209 Unspecified epiphora, unspecified lacrimal gland: Secondary | ICD-10-CM | POA: Insufficient documentation

## 2011-12-16 DIAGNOSIS — F29 Unspecified psychosis not due to a substance or known physiological condition: Secondary | ICD-10-CM

## 2011-12-16 DIAGNOSIS — G4733 Obstructive sleep apnea (adult) (pediatric): Secondary | ICD-10-CM

## 2011-12-16 DIAGNOSIS — M79609 Pain in unspecified limb: Secondary | ICD-10-CM

## 2011-12-16 LAB — BASIC METABOLIC PANEL
CO2: 28 mEq/L (ref 19–32)
Calcium: 9.4 mg/dL (ref 8.4–10.5)
Creatinine, Ser: 0.8 mg/dL (ref 0.4–1.2)
GFR: 77.89 mL/min (ref >60.00)

## 2011-12-16 NOTE — Progress Notes (Signed)
   Subjective:    Patient ID: Jamie [name], female    DOB: July 29, 1945, 66 y.o.   MRN: [id number]  HPI F/u on  LLE and occ LBP - better C/o B eye tearing x years, face would swell The patient presents for a follow-up of  chronic hypertension, chronic dyslipidemia, asthma controlled with medicines Episode last night where patient was dazed with mental confusion and slow slurred speach at night on 10/11 On CPAP  Wt Readings from Last 3 Encounters:  12/16/11 225 lb (102.059 kg)  10/24/11 226 lb (102.513 kg)  09/23/11 228 lb 9.6 oz (103.692 kg)   BP Readings from Last 3 Encounters:  12/16/11 130/72  12/09/11 146/77  10/24/11 120/80      Review of Systems  Constitutional: Negative for chills, activity change, appetite change, fatigue and unexpected weight change.  HENT: Negative for congestion, mouth sores and sinus pressure.   Eyes: Negative for visual disturbance.  Respiratory: Negative for cough and chest tightness.   Gastrointestinal: Negative for nausea.  Genitourinary: Negative for frequency, difficulty urinating and vaginal pain.  Musculoskeletal: Positive for back pain. Negative for gait problem.  Skin: Negative for pallor and rash.  Neurological: Negative for dizziness and tremors.  Psychiatric/Behavioral: Negative for confusion and sleep disturbance.       Objective:   Physical Exam  Constitutional: She appears well-developed. No distress.       obese  HENT:  Head: Normocephalic.  Right Ear: External ear normal.  Left Ear: External ear normal.  Nose: Nose normal.  Mouth/Throat: Oropharynx is clear and moist.  Eyes: Conjunctivae are normal. Pupils are equal, round, and reactive to light. Right eye exhibits no discharge. Left eye exhibits no discharge.  Neck: Normal range of motion. Neck supple. No JVD present. No tracheal deviation present. No thyromegaly present.  Cardiovascular: Normal rate, regular rhythm and normal heart sounds.   Pulmonary/Chest: No  stridor. No respiratory distress. She has no wheezes.  Abdominal: Soft. Bowel sounds are normal. She exhibits no distension and no mass. There is no tenderness. There is no rebound and no guarding.  Musculoskeletal: She exhibits tenderness. She exhibits no edema.       L hip is sensitive  Lymphadenopathy:    She has no cervical adenopathy.  Neurological: She displays normal reflexes. No cranial nerve deficit. She exhibits normal muscle tone. Coordination normal.  Skin: Rash (rosacea) noted. No erythema.  Psychiatric: Her behavior is normal. Judgment and thought content normal.   R shoulder is tender Lab Results  Component Value Date   WBC 5.3 05/03/2011   HGB 12.8 05/03/2011   HCT 37.6 05/03/2011   PLT 219.0 05/03/2011   GLUCOSE 102* 05/03/2011   CHOL 168 08/24/2010   TRIG 85.0 08/24/2010   HDL 65.40 08/24/2010   LDLCALC 86 08/24/2010   ALT 84* 05/03/2011   AST 43* 05/03/2011   NA 142 05/03/2011   K 4.6 05/03/2011   CL 109 05/03/2011   CREATININE 0.8 05/03/2011   BUN 18 05/03/2011   CO2 26 05/03/2011   TSH 1.12 05/03/2011   HGBA1C 5.7 05/03/2011          Assessment & Plan:

## 2011-12-16 NOTE — Assessment & Plan Note (Signed)
On CPAP. ?

## 2011-12-16 NOTE — Assessment & Plan Note (Signed)
Continue with current prescription therapy as reflected on the Med list.  

## 2011-12-16 NOTE — Assessment & Plan Note (Signed)
Ophth cons Dr Herbert Deaner 6/13 chronic; r/o tear duct obstruction

## 2011-12-16 NOTE — Assessment & Plan Note (Signed)
No relapse 

## 2011-12-16 NOTE — Assessment & Plan Note (Signed)
Better after hip injection

## 2011-12-18 ENCOUNTER — Other Ambulatory Visit: Payer: Self-pay | Admitting: Internal Medicine

## 2011-12-24 ENCOUNTER — Encounter: Payer: Self-pay | Admitting: Pulmonary Disease

## 2012-02-06 ENCOUNTER — Encounter (HOSPITAL_COMMUNITY): Payer: Self-pay | Admitting: Emergency Medicine

## 2012-02-06 ENCOUNTER — Emergency Department (HOSPITAL_COMMUNITY)

## 2012-02-06 ENCOUNTER — Ambulatory Visit: Admitting: Internal Medicine

## 2012-02-06 ENCOUNTER — Emergency Department (HOSPITAL_COMMUNITY)
Admission: EM | Admit: 2012-02-06 | Discharge: 2012-02-06 | Disposition: A | Discharge status: Home / Self Care | Attending: Emergency Medicine | Admitting: Emergency Medicine

## 2012-02-06 DIAGNOSIS — I10 Essential (primary) hypertension: Secondary | ICD-10-CM | POA: Insufficient documentation

## 2012-02-06 DIAGNOSIS — K7689 Other specified diseases of liver: Secondary | ICD-10-CM | POA: Insufficient documentation

## 2012-02-06 DIAGNOSIS — R109 Unspecified abdominal pain: Secondary | ICD-10-CM | POA: Insufficient documentation

## 2012-02-06 DIAGNOSIS — N12 Tubulo-interstitial nephritis, not specified as acute or chronic: Secondary | ICD-10-CM | POA: Insufficient documentation

## 2012-02-06 LAB — URINALYSIS, ROUTINE W REFLEX MICROSCOPIC
Glucose, UA: NEGATIVE mg/dL
Protein, ur: NEGATIVE mg/dL
pH: 5 (ref 5.0–8.0)

## 2012-02-06 LAB — CBC
HCT: 39.3 % (ref 36.0–46.0)
MCHC: 34.1 g/dL (ref 30.0–36.0)
RDW: 13.1 % (ref 11.5–15.5)

## 2012-02-06 LAB — POCT I-STAT, CHEM 8
BUN: 11 mg/dL (ref 6–23)
Creatinine, Ser: 0.7 mg/dL (ref 0.50–1.10)
Glucose, Bld: 120 mg/dL — ABNORMAL HIGH (ref 70–99)
Hemoglobin: 13.6 g/dL (ref 12.0–15.0)
Potassium: 4.1 mEq/L (ref 3.5–5.1)

## 2012-02-06 LAB — URINE MICROSCOPIC-ADD ON

## 2012-02-06 MED ORDER — ONDANSETRON 8 MG PO TBDP: 8.0000 mg | ORAL_TABLET | Freq: Three times a day (TID) | ORAL | Status: AC | PRN

## 2012-02-06 MED ORDER — HYDROMORPHONE HCL PF 1 MG/ML IJ SOLN
1.0000 mg | Freq: Once | INTRAMUSCULAR | Status: AC
Start: 2012-02-06 — End: 2012-02-06
  Administered 2012-02-06: 1 mg via INTRAVENOUS
  Filled 2012-02-06: qty 1

## 2012-02-06 MED ORDER — ONDANSETRON HCL 4 MG/2ML IJ SOLN
4.0000 mg | Freq: Once | INTRAMUSCULAR | Status: AC
  Administered 2012-02-06: 4 mg via INTRAVENOUS
  Filled 2012-02-06: qty 2

## 2012-02-06 MED ORDER — CEPHALEXIN 500 MG PO CAPS: 500.0000 mg | ORAL_CAPSULE | Freq: Four times a day (QID) | ORAL | Status: AC

## 2012-02-06 MED ORDER — OXYCODONE-ACETAMINOPHEN 5-325 MG PO TABS: 1.0000 | ORAL_TABLET | Freq: Four times a day (QID) | ORAL | Status: AC | PRN

## 2012-02-06 NOTE — ED Notes (Signed)
Patient transported to CT 

## 2012-02-06 NOTE — ED Notes (Signed)
Pt reports pain on urination x 10 hrs. Noted small amt blood in urine

## 2012-02-06 NOTE — ED Provider Notes (Signed)
History     CSN: [id number]  Arrival date & time 02/06/12  1001   First MD Initiated Contact with Patient 02/06/12 1039      Chief Complaint  Patient presents with  . Urinary Tract Infection    burning on urinationm  . Nausea    x 6 hrs  . Chills    (Consider location/radiation/quality/duration/timing/severity/associated sxs/prior treatment) Patient is a 66 y.o. female presenting with urinary tract infection. The history is provided by the patient.  Urinary Tract Infection This is a recurrent problem. Associated symptoms include chills and nausea. Pertinent negatives include no abdominal pain, chest pain, fever, neck pain or vomiting.  Developed dysuria overnight. +frequency without excessive UOP, several episodes trace hematuria. Symptoms progressively worsening to include bilateral lower back pain, L>R, chills (without measured fever), full-body pain when attempting to urinate. Pain severe enough to take her breath away. +nausea without emesis. No abd pain. No vaginal sx. No modifying factors. Took AZO standard overnight without symptom relief. No personal hx kidney stones, +FH.   Past Medical History  Diagnosis Date  . Asthma   . GERD (gastroesophageal reflux disease)   . Hypertension   . Obesity   . Allergic rhinitis   . IBS (irritable bowel syndrome)   . Fatty liver disease, nonalcoholic   . Adenomatous colon polyp   . Blood transfusion   . Heart murmur   . Ulcer     Past Surgical History  Procedure Date  . Abdominal hysterectomy 1979  . Tonsillectomy   . Breast reduction surgery Jan 1991    Family History  Problem Relation Age of Onset  . Heart disease Father 76  . Lung cancer Father   . Cancer Father 72    lung ca  . Heart disease Brother   . Asthma Neg Hx   . Hypertension Other   . Cancer Cousin     colon  . Allergies Daughter   . Allergies Brother   . Cancer Daughter     malignant melanoma    History  Substance Use Topics  . Smoking status:  Never Smoker   . Smokeless tobacco: Never Used  . Alcohol Use: 1.8 oz/week    3 Glasses of wine per week     daily>>>counselled to reduce ETOH use     Review of Systems  Constitutional: Positive for chills. Negative for fever.  HENT: Negative for neck pain.   Eyes: Negative for pain.  Respiratory:       See HPI, otherwise negative  Cardiovascular: Negative for chest pain.  Gastrointestinal: Positive for nausea. Negative for vomiting and abdominal pain.  Genitourinary: Positive for dysuria, urgency, frequency, hematuria and flank pain. Negative for vaginal discharge, vaginal pain and pelvic pain.  Musculoskeletal: Positive for back pain. Negative for gait problem.  Skin: Negative for color change and wound.  Neurological: Negative for dizziness and syncope.  Psychiatric/Behavioral: Negative for confusion.    Allergies  Aspirin and Moxifloxacin  Home Medications   Current Outpatient Rx  Name Route Sig Dispense Refill  . CLINDINIUM-CHLORDIAZEPOXIDE 2.5-5 MG PO CAPS Oral Take 1 capsule by mouth at bedtime.    Marland Kitchen CLONAZEPAM 1 MG PO TABS Oral Take 1 mg by mouth at bedtime as needed. sleep    . ESOMEPRAZOLE MAGNESIUM 40 MG PO CPDR Oral Take 40 mg by mouth daily.    Marland Kitchen ESTROGENS CONJUGATED 0.3 MG PO TABS Oral Take 0.3 mg by mouth daily. Take daily for 21 days then do not take for  7 days.    Marland Kitchen FLUTICASONE PROPIONATE 50 MCG/ACT NA SUSP Nasal Place 2 sprays into the nose daily as needed. 48 g 3  . METAXALONE 800 MG PO TABS Oral Take 800 mg by mouth at bedtime.    Marland Kitchen METOPROLOL SUCCINATE ER 100 MG PO TB24 Oral Take 100 mg by mouth daily.    Marland Kitchen OLMESARTAN MEDOXOMIL 40 MG PO TABS Oral Take 40 mg by mouth daily.    Marland Kitchen PRENATAL VITAMINS PO Oral Take 1 tablet by mouth daily.      Marland Kitchen SPIRONOLACTONE 50 MG PO TABS Oral Take 50 mg by mouth daily.      BP 141/78  Pulse 88  Temp 98.7 F (37.1 C) (Oral)  Resp 16  Ht 5' 3"  (1.6 m)  Wt 225 lb (102.059 kg)  BMI 39.86 kg/m2  SpO2 96%  Physical Exam    Nursing note and vitals reviewed. Constitutional: She appears well-developed and well-nourished.       Uncomfortable appearing.  HENT:  Head: Normocephalic and atraumatic.       MMM  Eyes: Conjunctivae are normal.  Neck: Neck supple.  Cardiovascular: Normal rate and regular rhythm.   Pulmonary/Chest: Effort normal and breath sounds normal. No respiratory distress. She has no wheezes. She has no rales. She exhibits no tenderness.  Abdominal: Soft.       Obese, non-tender. Nml bowel sounds. No CVAT.  Musculoskeletal: Normal range of motion. She exhibits no edema and no tenderness.  Neurological: She is alert.       Speech clear and appropriate. MS appears baseline for pt.  Skin: Skin is warm and dry.  Psychiatric: She has a normal mood and affect.    ED Course  Procedures (including critical care time)  Labs Reviewed  URINALYSIS, ROUTINE W REFLEX MICROSCOPIC - Abnormal; Notable for the following:    Color, Urine RED (*)  BIOCHEMICALS MAY BE AFFECTED BY COLOR   APPearance CLOUDY (*)     Hgb urine dipstick TRACE (*)     Bilirubin Urine SMALL (*)     Ketones, ur TRACE (*)     Nitrite POSITIVE (*)     Leukocytes, UA MODERATE (*)     All other components within normal limits  URINE MICROSCOPIC-ADD ON - Abnormal; Notable for the following:    Bacteria, UA FEW (*)     All other components within normal limits  CBC   Ct Abdomen Pelvis Wo Contrast  02/06/2012  *RADIOLOGY REPORT*  Clinical Data:  Flank pain, hematuria  CT ABDOMEN AND PELVIS WITHOUT CONTRAST  Technique:  Multidetector CT imaging of the abdomen and pelvis was performed following the standard protocol without intravenous contrast.  Comparison: Prior abdominal ultrasound 02/23/2010  Findings:  Lower Chest:  Minimal dependent atelectasis in the lower lobe. Otherwise, the visualized lower thorax is unremarkable.  No pericardial effusion.  Abdomen: Stomach and duodenum are unremarkable.  The spleen, pancreas, and adrenal glands  are normal in an noncontrasted CT appearance.  Diffuse hypoattenuation of the hepatic parenchyma with sparing around the gallbladder fossa consistent with hepatic steatosis. Gallbladder is unremarkable. No intra or extrahepatic biliary ductal dilatation.  A 2.8 cm water attenuation lesion in the medial aspect of the interpolar right kidney incompletely evaluated the absence of intravenous contrast material.  No radiopaque nephrolithiasis.  No hydronephrosis.  Normal-caliber large and small bowel throughout the abdomen without evidence of obstruction.  There are a few scattered colonic diverticula.  No evidence of focal, or active inflammation.  Normal appendix identified in the right lower quadrant. No ascites.  No suspicious mesenteric, and upper abdominal or retroperitoneal adenopathy.  Pelvis: Normal bladder.  Surgical changes of prior hysterectomy. Bilateral adnexa are unremarkable appearance.  No free pelvic fluid.  No suspicious pelvic adenopathy.  Bones: No acute fracture or aggressive appearing lytic or blastic osseous lesion.  Small fat containing umbilical hernia.  Lower lumbar facet arthropathy at L4-L5, and L5-S1.  No significant degenerative disc disease.  Vascular: Trace scattered atherosclerotic vascular calcifications. No aneurysmal dilatation.  IMPRESSION:  1.  No acute intra-abdominal or pelvic process to explain the patient's clinical symptoms of flank pain. Specifically, no renal or ureteral lithiasis.  2.  A 2.8 cm low attenuation lesion in the medial aspect of the interpolar right kidney is incompletely evaluated the absence of intravenous contrast material.  Although this is highly likely to represent a benign renal cyst, further imaging evaluation is recommended to confirm given the patient's history of hematuria. Recommend either renal ultrasound, or non emergent contrasted CT scan of the abdomen.  3.  Hepatic steatosis  Original Report Authenticated By: HEATH     1. Pyelonephritis        MDM  Dysuria with flank pain overnight, chills no fever. On exam, pt very uncomfortable. Initially with no discernible CVAT though on repeat exam does demonstrate some mild left CVAT. Labs are reviewed, c/w UTI. No leukocytosis. Renal function preserved. EDP was asked to evaluate pt and a CT scan of the abd/pelvis w/o contrast was ordered to r/o ureteral stone as contributing factor. This was negative, but did demonstrate 2.8cm lesion in right kidney. Radiologist recommended non-emergent re-eval and this is discussed with the pt who has good PCP f/u. Will tx for pyelonephritis, urine culture sent. Pt appears stable, non-toxic, afebrile, tolerating PO- good outpatient tx candidate. Strict return precautions were discussed.         Orlie Dakin, PA-C 02/06/12 1650

## 2012-02-06 NOTE — ED Notes (Signed)
Pt states she started to have bilateral flank pain last night. Pt states she noticed trace amount blood in her urine. Pt states she has been nauseated. Pt states she is also having bladder spams

## 2012-02-06 NOTE — ED Provider Notes (Signed)
History     CSN: [id number]  Arrival date & time 02/06/12  1001   First MD Initiated Contact with Patient 02/06/12 1039      Chief Complaint  Patient presents with  . Urinary Tract Infection    burning on urinationm  . Nausea    x 6 hrs  . Chills    (Consider location/radiation/quality/duration/timing/severity/associated sxs/prior treatment) HPI  Past Medical History  Diagnosis Date  . Asthma   . GERD (gastroesophageal reflux disease)   . Hypertension   . Obesity   . Allergic rhinitis   . IBS (irritable bowel syndrome)   . Fatty liver disease, nonalcoholic   . Adenomatous colon polyp   . Blood transfusion   . Heart murmur   . Ulcer     Past Surgical History  Procedure Date  . Abdominal hysterectomy 1979  . Tonsillectomy   . Breast reduction surgery Jan 1991    Family History  Problem Relation Age of Onset  . Heart disease Father 2  . Lung cancer Father   . Cancer Father 69    lung ca  . Heart disease Brother   . Asthma Neg Hx   . Hypertension Other   . Cancer Cousin     colon  . Allergies Daughter   . Allergies Brother   . Cancer Daughter     malignant melanoma    History  Substance Use Topics  . Smoking status: Never Smoker   . Smokeless tobacco: Never Used  . Alcohol Use: 1.8 oz/week    3 Glasses of wine per week     daily>>>counselled to reduce ETOH use     OB History    Grav Para Term Preterm Abortions TAB SAB Ect Mult Living                  Review of Systems  Allergies  Aspirin and Moxifloxacin  Home Medications   Current Outpatient Rx  Name Route Sig Dispense Refill  . CLINDINIUM-CHLORDIAZEPOXIDE 2.5-5 MG PO CAPS Oral Take 1 capsule by mouth at bedtime.    Marland Kitchen CLONAZEPAM 1 MG PO TABS Oral Take 1 mg by mouth at bedtime as needed. sleep    . ESOMEPRAZOLE MAGNESIUM 40 MG PO CPDR Oral Take 40 mg by mouth daily.    Marland Kitchen ESTROGENS CONJUGATED 0.3 MG PO TABS Oral Take 0.3 mg by mouth daily. Take daily for 21 days then do not take for 7  days.    Marland Kitchen FLUTICASONE PROPIONATE 50 MCG/ACT NA SUSP Nasal Place 2 sprays into the nose daily as needed. 48 g 3  . METAXALONE 800 MG PO TABS Oral Take 800 mg by mouth at bedtime.    Marland Kitchen METOPROLOL SUCCINATE ER 100 MG PO TB24 Oral Take 100 mg by mouth daily.    Marland Kitchen OLMESARTAN MEDOXOMIL 40 MG PO TABS Oral Take 40 mg by mouth daily.    Marland Kitchen PRENATAL VITAMINS PO Oral Take 1 tablet by mouth daily.      Marland Kitchen SPIRONOLACTONE 50 MG PO TABS Oral Take 50 mg by mouth daily.      BP 102/56  Pulse 70  Temp 98.7 F (37.1 C) (Oral)  Resp 18  Ht 5' 3"  (1.6 m)  Wt 225 lb (102.059 kg)  BMI 39.86 kg/m2  SpO2 97%  Physical Exam  ED Course  Procedures (including critical care time)  Labs Reviewed  URINALYSIS, ROUTINE W REFLEX MICROSCOPIC - Abnormal; Notable for the following:    Color, Urine RED (*)  BIOCHEMICALS MAY BE AFFECTED BY COLOR   APPearance CLOUDY (*)     Hgb urine dipstick TRACE (*)     Bilirubin Urine SMALL (*)     Ketones, ur TRACE (*)     Nitrite POSITIVE (*)     Leukocytes, UA MODERATE (*)     All other components within normal limits  URINE MICROSCOPIC-ADD ON - Abnormal; Notable for the following:    Bacteria, UA FEW (*)     All other components within normal limits  POCT I-STAT, CHEM 8 - Abnormal; Notable for the following:    Glucose, Bld 120 (*)     All other components within normal limits  CBC   No results found.   No diagnosis found.    MDM  Mrs. Tindall is being evaluated primarily by PA Rogers Blocker, however I have also obtained a hx and performed a PE.  She c/o increasing lower abd discomfort, dysuria with increased urinary frequency, and lowe back pain.  Currently she appears nontoxic, NAD.  She has no peritoneal s/s on exam, no evidence in VS or labs of systemic infection, but her pain has localized to right lower back.  Plan CT to evaluate for urinary obstruction and kidney/ureteral stone.  If negative, plan d/c home with abx for complicated UTI and symptomatic  care.        Perlie Mayo, MD 02/06/12 1425

## 2012-02-07 LAB — URINE CULTURE
Colony Count: NO GROWTH
Culture: NO GROWTH

## 2012-02-13 ENCOUNTER — Telehealth: Payer: Self-pay

## 2012-02-13 DIAGNOSIS — N390 Urinary tract infection, site not specified: Secondary | ICD-10-CM

## 2012-02-13 NOTE — Telephone Encounter (Signed)
Pt husband would like for an order to be put in for the pt to have a UA before the weekend to check if the antibiotics are helping her infection, he would like a call back before 5 today

## 2012-02-13 NOTE — Telephone Encounter (Signed)
done

## 2012-02-13 NOTE — Telephone Encounter (Signed)
Informed pts husband of UA orders

## 2012-02-13 NOTE — Telephone Encounter (Signed)
Pt called requesting results of recent CT abd.

## 2012-02-14 ENCOUNTER — Telehealth: Payer: Self-pay | Admitting: Internal Medicine

## 2012-02-14 ENCOUNTER — Other Ambulatory Visit

## 2012-02-14 ENCOUNTER — Other Ambulatory Visit (INDEPENDENT_AMBULATORY_CARE_PROVIDER_SITE_OTHER)

## 2012-02-14 DIAGNOSIS — N12 Tubulo-interstitial nephritis, not specified as acute or chronic: Secondary | ICD-10-CM

## 2012-02-14 DIAGNOSIS — N390 Urinary tract infection, site not specified: Secondary | ICD-10-CM

## 2012-02-14 DIAGNOSIS — N281 Cyst of kidney, acquired: Secondary | ICD-10-CM

## 2012-02-14 LAB — URINALYSIS, ROUTINE W REFLEX MICROSCOPIC
Bilirubin Urine: NEGATIVE
Hgb urine dipstick: NEGATIVE
Leukocytes, UA: NEGATIVE
Nitrite: NEGATIVE
Urobilinogen, UA: 0.2 (ref 0.0–1.0)

## 2012-02-14 NOTE — Telephone Encounter (Signed)
Needs [name] to f/u on R renal cyst - per CT report. Repeat UA if not done Will order [name]

## 2012-02-17 ENCOUNTER — Telehealth: Payer: Self-pay | Admitting: *Deleted

## 2012-02-17 ENCOUNTER — Ambulatory Visit
Admission: RE | Admit: 2012-02-17 | Discharge: 2012-02-17 | Disposition: A | Discharge status: Home / Self Care | Source: Ambulatory Visit | Attending: Internal Medicine | Admitting: Internal Medicine

## 2012-02-17 DIAGNOSIS — N2889 Other specified disorders of kidney and ureter: Secondary | ICD-10-CM

## 2012-02-17 NOTE — Telephone Encounter (Signed)
[facility] called with call report on pt's U/S-report is in Epic, they would like you to review.

## 2012-02-19 ENCOUNTER — Telehealth: Payer: Self-pay | Admitting: Internal Medicine

## 2012-02-19 NOTE — Telephone Encounter (Signed)
I tried to reach the pt w/test results ([name]). I left a VM. Caryl Pina, pls call the pt and inform that the radiologist who read [name] is recommending an MRI. Is it OK to schedule?  Please, mail the [name] report to the patient.     Thx

## 2012-02-19 NOTE — Telephone Encounter (Signed)
The pt called hoping to get results.  She states she needs the call returned on her home line - [phone].  Thanks!

## 2012-02-20 ENCOUNTER — Telehealth: Payer: Self-pay | Admitting: *Deleted

## 2012-02-20 DIAGNOSIS — N2889 Other specified disorders of kidney and ureter: Secondary | ICD-10-CM

## 2012-02-20 NOTE — Telephone Encounter (Signed)
Will sch Thx

## 2012-02-20 NOTE — Telephone Encounter (Signed)
OK - pls change Thx

## 2012-02-20 NOTE — Telephone Encounter (Signed)
Left message for pt to callback office.  

## 2012-02-20 NOTE — Telephone Encounter (Signed)
MRI Abdomen Limited order for pt needs to be changed to MRI Abdomen w/ and w/o contrast-per radiologist they do not do MRI Abdomen Limited. Order needs to be changed so that pt can be scheduled.

## 2012-02-20 NOTE — Telephone Encounter (Signed)
Pt husband notified and said it is ok to schedule the MRI

## 2012-02-20 NOTE — Telephone Encounter (Signed)
Pt informed of results of U/S via VM and to callback office with any questions/concerns (letter also mailed to pt per AVP's request).

## 2012-02-21 NOTE — Telephone Encounter (Signed)
Order changed.

## 2012-02-23 ENCOUNTER — Emergency Department (HOSPITAL_COMMUNITY)
Admission: EM | Admit: 2012-02-23 | Discharge: 2012-02-23 | Disposition: A | Discharge status: Home / Self Care | Attending: Emergency Medicine | Admitting: Emergency Medicine

## 2012-02-23 ENCOUNTER — Encounter (HOSPITAL_COMMUNITY): Payer: Self-pay | Admitting: Emergency Medicine

## 2012-02-23 DIAGNOSIS — K219 Gastro-esophageal reflux disease without esophagitis: Secondary | ICD-10-CM | POA: Insufficient documentation

## 2012-02-23 DIAGNOSIS — I10 Essential (primary) hypertension: Secondary | ICD-10-CM | POA: Insufficient documentation

## 2012-02-23 DIAGNOSIS — K589 Irritable bowel syndrome without diarrhea: Secondary | ICD-10-CM | POA: Insufficient documentation

## 2012-02-23 DIAGNOSIS — J45909 Unspecified asthma, uncomplicated: Secondary | ICD-10-CM | POA: Insufficient documentation

## 2012-02-23 DIAGNOSIS — N390 Urinary tract infection, site not specified: Secondary | ICD-10-CM | POA: Insufficient documentation

## 2012-02-23 DIAGNOSIS — Q619 Cystic kidney disease, unspecified: Secondary | ICD-10-CM | POA: Insufficient documentation

## 2012-02-23 LAB — URINALYSIS, ROUTINE W REFLEX MICROSCOPIC
Bilirubin Urine: NEGATIVE
Glucose, UA: NEGATIVE mg/dL
Ketones, ur: NEGATIVE mg/dL
Nitrite: NEGATIVE
Protein, ur: NEGATIVE mg/dL
Specific Gravity, Urine: 1.008 (ref 1.005–1.030)
Urobilinogen, UA: 0.2 mg/dL (ref 0.0–1.0)
pH: 6.5 (ref 5.0–8.0)

## 2012-02-23 LAB — URINE MICROSCOPIC-ADD ON

## 2012-02-23 MED ORDER — ONDANSETRON HCL 4 MG/2ML IJ SOLN
4.0000 mg | Freq: Once | INTRAMUSCULAR | Status: AC
  Administered 2012-02-23: 4 mg via INTRAVENOUS
  Filled 2012-02-23: qty 2

## 2012-02-23 MED ORDER — PHENAZOPYRIDINE HCL 200 MG PO TABS: 200.0000 mg | ORAL_TABLET | Freq: Three times a day (TID) | ORAL | Status: AC | PRN

## 2012-02-23 MED ORDER — SULFAMETHOXAZOLE-TRIMETHOPRIM 800-160 MG PO TABS: 1.0000 | ORAL_TABLET | Freq: Two times a day (BID) | ORAL | Status: AC

## 2012-02-23 MED ORDER — SODIUM CHLORIDE 0.9 % IV SOLN
Freq: Once | INTRAVENOUS | Status: AC
  Administered 2012-02-23: 18:00:00 via INTRAVENOUS

## 2012-02-23 MED ORDER — DEXTROSE 5 % IV SOLN
1.0000 g | Freq: Once | INTRAVENOUS | Status: AC
  Administered 2012-02-23: 1 g via INTRAVENOUS
  Filled 2012-02-23: qty 10

## 2012-02-23 MED ORDER — MORPHINE SULFATE 4 MG/ML IJ SOLN
4.0000 mg | Freq: Once | INTRAMUSCULAR | Status: AC
  Administered 2012-02-23: 4 mg via INTRAVENOUS
  Filled 2012-02-23: qty 1

## 2012-02-23 MED ORDER — HYDROCODONE-ACETAMINOPHEN 5-500 MG PO TABS: 1.0000 | ORAL_TABLET | Freq: Four times a day (QID) | ORAL | Status: AC | PRN

## 2012-02-23 MED ORDER — PHENAZOPYRIDINE HCL 200 MG PO TABS
200.0000 mg | ORAL_TABLET | Freq: Three times a day (TID) | ORAL | Status: DC
  Administered 2012-02-23: 200 mg via ORAL
  Filled 2012-02-23: qty 1

## 2012-02-23 NOTE — ED Notes (Signed)
Treated for UTI here Aug 1st- also dx with a lesion on kidney- sent for f/u with reg dr. Symptoms started coming back today--back pain, frequency, urgency, dysuria,

## 2012-02-23 NOTE — ED Provider Notes (Signed)
History     CSN: [id number]  Arrival date & time 02/23/12  1514   First MD Initiated Contact with Patient 02/23/12 1618      Chief Complaint  Patient presents with  . Urinary Tract Infection    (Consider location/radiation/quality/duration/timing/severity/associated sxs/prior treatment) HPI Comments: Patient recently diagnosed with a uti and treated.  Symptoms are returning.  No fevers or chills.  No n/v/d.  Patient is a 66 y.o. female presenting with urinary tract infection. The history is provided by the patient.  Urinary Tract Infection This is a recurrent problem. Episode onset: this morning. The problem occurs constantly. The problem has been rapidly worsening. Pertinent negatives include no abdominal pain. Exacerbated by: urinating. Nothing relieves the symptoms. She has tried nothing for the symptoms.    Past Medical History  Diagnosis Date  . Asthma   . GERD (gastroesophageal reflux disease)   . Hypertension   . Obesity   . Allergic rhinitis   . IBS (irritable bowel syndrome)   . Fatty liver disease, nonalcoholic   . Adenomatous colon polyp   . Blood transfusion   . Heart murmur   . Ulcer     Past Surgical History  Procedure Date  . Abdominal hysterectomy 1979  . Tonsillectomy   . Breast reduction surgery Jan 1991    Family History  Problem Relation Age of Onset  . Heart disease Father 66  . Lung cancer Father   . Cancer Father 38    lung ca  . Heart disease Brother   . Asthma Neg Hx   . Hypertension Other   . Cancer Cousin     colon  . Allergies Daughter   . Allergies Brother   . Cancer Daughter     malignant melanoma    History  Substance Use Topics  . Smoking status: Never Smoker   . Smokeless tobacco: Never Used  . Alcohol Use: 1.8 oz/week    3 Glasses of wine per week     daily>>>counselled to reduce ETOH use     OB History    Grav Para Term Preterm Abortions TAB SAB Ect Mult Living                  Review of Systems    Gastrointestinal: Negative for abdominal pain.  All other systems reviewed and are negative.    Allergies  Aspirin and Moxifloxacin  Home Medications   Current Outpatient Rx  Name Route Sig Dispense Refill  . CLINDINIUM-CHLORDIAZEPOXIDE 2.5-5 MG PO CAPS Oral Take 1 capsule by mouth at bedtime.    Marland Kitchen CLONAZEPAM 1 MG PO TABS Oral Take 1 mg by mouth at bedtime as needed. sleep    . ESOMEPRAZOLE MAGNESIUM 40 MG PO CPDR Oral Take 40 mg by mouth 2 (two) times daily.     Marland Kitchen ESTROGENS CONJUGATED 0.3 MG PO TABS Oral Take 0.3 mg by mouth daily.     Marland Kitchen FLUTICASONE PROPIONATE 50 MCG/ACT NA SUSP Nasal Place 2 sprays into the nose daily as needed. 48 g 3  . METAXALONE 800 MG PO TABS Oral Take 800 mg by mouth at bedtime.    Marland Kitchen METOPROLOL SUCCINATE ER 100 MG PO TB24 Oral Take 100 mg by mouth daily.    Marland Kitchen OLMESARTAN MEDOXOMIL 40 MG PO TABS Oral Take 40 mg by mouth every evening.     Marland Kitchen PRENATAL VITAMINS PO Oral Take 1 tablet by mouth daily.      Marland Kitchen SPIRONOLACTONE 50 MG PO TABS Oral  Take 50 mg by mouth daily.      BP 121/65  Pulse 70  Temp 98.6 F (37 C) (Oral)  Resp 16  SpO2 98%  Physical Exam  Nursing note and vitals reviewed. Constitutional: She is oriented to person, place, and time. She appears well-developed and well-nourished. No distress.  HENT:  Head: Normocephalic and atraumatic.  Neck: Normal range of motion. Neck supple.  Cardiovascular: Normal rate and regular rhythm.  Exam reveals no gallop and no friction rub.   No murmur heard. Pulmonary/Chest: Effort normal and breath sounds normal. No respiratory distress. She has no wheezes.  Abdominal: Soft. Bowel sounds are normal. She exhibits no distension. There is no tenderness.  Musculoskeletal: Normal range of motion.  Neurological: She is alert and oriented to person, place, and time.  Skin: Skin is warm and dry. She is not diaphoretic.    ED Course  Procedures (including critical care time)   Labs Reviewed  URINALYSIS, ROUTINE  W REFLEX MICROSCOPIC   No results found.   No diagnosis found.    MDM  The urine shows a uti.  She was recently on keflex and I will change to bactrim as she is allergic to fluoroquinolones.  She will be discharged with bactrim, pyridium, and lortab.  To return prn if worsens.  She is to follow up with her pcp to discuss the abdominal mri that was recommended for further evaluation of her renal cyst.          Veryl Speak, MD 02/23/12 [phone]

## 2012-02-23 NOTE — ED Notes (Signed)
RVU:[id number]<ID> Expected date:02/23/12<BR> Expected time: 5:44 PM<BR> Means of arrival:Ambulance<BR> Comments:<BR> Fall

## 2012-02-23 NOTE — ED Notes (Signed)
CXK:[id number]<HU> Expected date:<BR> Expected time:<BR> Means of arrival:<BR> Comments:<BR> Hold for triage 1-- Randol Kern

## 2012-02-24 ENCOUNTER — Other Ambulatory Visit: Payer: Self-pay | Admitting: *Deleted

## 2012-02-24 DIAGNOSIS — N281 Cyst of kidney, acquired: Secondary | ICD-10-CM

## 2012-02-26 ENCOUNTER — Ambulatory Visit
Admission: RE | Admit: 2012-02-26 | Discharge: 2012-02-26 | Disposition: A | Discharge status: Home / Self Care | Source: Ambulatory Visit | Attending: Internal Medicine | Admitting: Internal Medicine

## 2012-02-26 ENCOUNTER — Inpatient Hospital Stay: Admission: RE | Admit: 2012-02-26 | Payer: PRIVATE HEALTH INSURANCE | Source: Ambulatory Visit

## 2012-02-26 DIAGNOSIS — N281 Cyst of kidney, acquired: Secondary | ICD-10-CM

## 2012-02-26 MED ORDER — GADOBENATE DIMEGLUMINE 529 MG/ML IV SOLN
20.0000 mL | Freq: Once | INTRAVENOUS | Status: AC | PRN
  Administered 2012-02-26: 20 mL via INTRAVENOUS

## 2012-02-27 ENCOUNTER — Telehealth: Payer: Self-pay | Admitting: Internal Medicine

## 2012-02-27 DIAGNOSIS — N2889 Other specified disorders of kidney and ureter: Secondary | ICD-10-CM

## 2012-02-27 NOTE — Telephone Encounter (Signed)
Pt was informed. She has an appt w/Dr McDearmid 9/19. Will try to move appt up Thx

## 2012-03-06 ENCOUNTER — Other Ambulatory Visit: Payer: Self-pay | Admitting: Urology

## 2012-03-10 ENCOUNTER — Ambulatory Visit (INDEPENDENT_AMBULATORY_CARE_PROVIDER_SITE_OTHER): Admitting: Internal Medicine

## 2012-03-10 ENCOUNTER — Encounter: Payer: Self-pay | Admitting: Internal Medicine

## 2012-03-10 VITALS — BP 110/60 | HR 68 | Temp 98.9°F | Resp 16 | Wt 226.0 lb

## 2012-03-10 DIAGNOSIS — N2889 Other specified disorders of kidney and ureter: Secondary | ICD-10-CM

## 2012-03-10 DIAGNOSIS — Z01818 Encounter for other preprocedural examination: Secondary | ICD-10-CM

## 2012-03-10 DIAGNOSIS — I517 Cardiomegaly: Secondary | ICD-10-CM

## 2012-03-10 DIAGNOSIS — N289 Disorder of kidney and ureter, unspecified: Secondary | ICD-10-CM

## 2012-03-10 DIAGNOSIS — I10 Essential (primary) hypertension: Secondary | ICD-10-CM

## 2012-03-10 DIAGNOSIS — J984 Other disorders of lung: Secondary | ICD-10-CM

## 2012-03-10 DIAGNOSIS — G4733 Obstructive sleep apnea (adult) (pediatric): Secondary | ICD-10-CM

## 2012-03-10 DIAGNOSIS — J309 Allergic rhinitis, unspecified: Secondary | ICD-10-CM

## 2012-03-10 DIAGNOSIS — J383 Other diseases of vocal cords: Secondary | ICD-10-CM

## 2012-03-10 MED ORDER — SPIRONOLACTONE 50 MG PO TABS: 50.0000 mg | ORAL_TABLET | Freq: Every day | ORAL | Status: DC

## 2012-03-10 MED ORDER — OLMESARTAN MEDOXOMIL 40 MG PO TABS: 40.0000 mg | ORAL_TABLET | Freq: Every evening | ORAL | Status: DC

## 2012-03-10 MED ORDER — CLONAZEPAM 1 MG PO TABS: 1.0000 mg | ORAL_TABLET | Freq: Every evening | ORAL | Status: DC | PRN

## 2012-03-10 MED ORDER — METAXALONE 800 MG PO TABS: 800.0000 mg | ORAL_TABLET | Freq: Every day | ORAL | Status: DC

## 2012-03-10 MED ORDER — FLUTICASONE PROPIONATE 50 MCG/ACT NA SUSP: 2.0000 | Freq: Every day | NASAL | Status: DC | PRN

## 2012-03-10 MED ORDER — METOPROLOL SUCCINATE ER 100 MG PO TB24: 100.0000 mg | ORAL_TABLET | Freq: Every day | ORAL | Status: DC

## 2012-03-10 NOTE — Assessment & Plan Note (Signed)
8/13 Dr Tresa Moore

## 2012-03-10 NOTE — Progress Notes (Signed)
Patient ID: Jamie [name], female   DOB: 02/12/1946, 66 y.o.   MRN: [id number]   Subjective:    Patient ID: Jamie [name], female    DOB: Mar 15, 1946, 66 y.o.   MRN: [id number]  HPI  IM consult Req by Dr Tresa Moore Reason: med clearance for a kidney resection - Oct 7th F/u on  LLE and occ LBP - better C/o B eye tearing x years, her face would swell - better The patient presents for a follow-up of  chronic hypertension, chronic dyslipidemia, asthma controlled with medicines - stable Episode last night where patient was dazed with mental confusion and slow slurred speach at night on 10/11 - no relapse On CPAP  Past Medical History  Diagnosis Date  . Asthma   . GERD (gastroesophageal reflux disease)   . Hypertension   . Obesity   . Allergic rhinitis   . IBS (irritable bowel syndrome)   . Fatty liver disease, nonalcoholic   . Adenomatous colon polyp   . Blood transfusion   . Heart murmur   . Ulcer    Past Surgical History  Procedure Date  . Abdominal hysterectomy 1979  . Tonsillectomy   . Breast reduction surgery Jan 1991    reports that she has never smoked. She has never used smokeless tobacco. She reports that she drinks about 1.8 ounces of alcohol per week. She reports that she does not use illicit drugs. family history includes Allergies in her brother and daughter; Cancer in her cousin and daughter; Cancer (age of onset:63) in her father; Heart disease in her brother; Heart disease (age of onset:42) in her father; Hypertension in her other; and Lung cancer in her father.  There is no history of Asthma. Allergies  Allergen Reactions  . Aspirin     REACTION: ulcers  . Moxifloxacin     Tachycardia    Current Outpatient Prescriptions on File Prior to Visit  Medication Sig Dispense Refill  . clidinium-chlordiazePOXIDE (LIBRAX) 2.5-5 MG per capsule Take 1 capsule by mouth at bedtime.      Marland Kitchen esomeprazole (NEXIUM) 40 MG capsule Take 40 mg by mouth 2 (two) times daily.         Marland Kitchen estrogens, conjugated, (PREMARIN) 0.3 MG tablet Take 0.3 mg by mouth daily.       Marland Kitchen PRENATAL VITAMINS PO Take 1 tablet by mouth daily.        Marland Kitchen DISCONTD: clonazePAM (KLONOPIN) 1 MG tablet Take 1 mg by mouth at bedtime as needed. sleep      . DISCONTD: fluticasone (FLONASE) 50 MCG/ACT nasal spray Place 2 sprays into the nose daily as needed.  48 g  3  . DISCONTD: metoprolol succinate (TOPROL-XL) 100 MG 24 hr tablet Take 100 mg by mouth daily.      Marland Kitchen DISCONTD: olmesartan (BENICAR) 40 MG tablet Take 40 mg by mouth every evening.       Marland Kitchen DISCONTD: spironolactone (ALDACTONE) 50 MG tablet Take 50 mg by mouth daily.      Marland Kitchen DISCONTD: esomeprazole (NEXIUM) 40 MG capsule Take 1 capsule (40 mg total) by mouth daily.  90 capsule  3   Current Facility-Administered Medications on File Prior to Visit  Medication Dose Route Frequency Provider Last Rate Last Dose  . methylPREDNISolone acetate (DEPO-MEDROL) injection 80 mg  80 mg Intra-articular Once Cassandria Anger, MD        Wt Readings from Last 3 Encounters:  03/10/12 226 lb (102.513 kg)  02/06/12 225 lb (102.059 kg)  12/16/11 225 lb (102.059 kg)   BP Readings from Last 3 Encounters:  03/10/12 110/60  02/23/12 121/65  02/06/12 113/65    BP 110/60  Pulse 68  Temp 98.9 F (37.2 C) (Oral)  Resp 16  Wt 226 lb (102.513 kg)   Review of Systems  Constitutional: Negative for chills, activity change, appetite change, fatigue and unexpected weight change.  HENT: Negative for congestion, mouth sores and sinus pressure.   Eyes: Negative for visual disturbance.  Respiratory: Negative for cough and chest tightness.   Gastrointestinal: Negative for nausea.  Genitourinary: Negative for frequency, difficulty urinating and vaginal pain.  Musculoskeletal: Positive for back pain. Negative for gait problem.  Skin: Negative for pallor and rash.  Neurological: Negative for dizziness and tremors.  Psychiatric/Behavioral: Negative for confusion and  disturbed wake/sleep cycle.       Objective:   Physical Exam  Constitutional: She appears well-developed. No distress.       obese  HENT:  Head: Normocephalic.  Right Ear: External ear normal.  Left Ear: External ear normal.  Nose: Nose normal.  Mouth/Throat: Oropharynx is clear and moist.  Eyes: Conjunctivae are normal. Pupils are equal, round, and reactive to light. Right eye exhibits no discharge. Left eye exhibits no discharge.  Neck: Normal range of motion. Neck supple. No JVD present. No tracheal deviation present. No thyromegaly present.  Cardiovascular: Normal rate, regular rhythm and normal heart sounds.   Pulmonary/Chest: No stridor. No respiratory distress. She has no wheezes.  Abdominal: Soft. Bowel sounds are normal. She exhibits no distension and no mass. There is no tenderness. There is no rebound and no guarding.  Musculoskeletal: She exhibits tenderness. She exhibits no edema.       L hip is sensitive  Lymphadenopathy:    She has no cervical adenopathy.  Neurological: She displays normal reflexes. No cranial nerve deficit. She exhibits normal muscle tone. Coordination normal.  Skin: Rash (rosacea) noted. No erythema.  Psychiatric: Her behavior is normal. Judgment and thought content normal.   R shoulder is less tender Lab Results  Component Value Date   WBC 10.1 02/06/2012   HGB 13.6 02/06/2012   HCT 40.0 02/06/2012   PLT 242 02/06/2012   GLUCOSE 120* 02/06/2012   CHOL 168 08/24/2010   TRIG 85.0 08/24/2010   HDL 65.40 08/24/2010   LDLCALC 86 08/24/2010   ALT 84* 05/03/2011   AST 43* 05/03/2011   NA 140 02/06/2012   K 4.1 02/06/2012   CL 105 02/06/2012   CREATININE 0.70 02/06/2012   BUN 11 02/06/2012   CO2 28 12/16/2011   TSH 1.12 05/03/2011   HGBA1C 5.4 12/16/2011     [name], CT, MRI - reviewed     Assessment & Plan:

## 2012-03-10 NOTE — Assessment & Plan Note (Signed)
Doing well lately

## 2012-03-10 NOTE — Assessment & Plan Note (Signed)
CPAP.  

## 2012-03-10 NOTE — Assessment & Plan Note (Signed)
Continue with current prescription therapy as reflected on the Med list.  

## 2012-03-10 NOTE — Assessment & Plan Note (Signed)
PFTs similar to 2009 values Due to obesity No primary lung disease

## 2012-03-17 ENCOUNTER — Telehealth: Payer: Self-pay | Admitting: Internal Medicine

## 2012-03-17 ENCOUNTER — Ambulatory Visit (HOSPITAL_COMMUNITY): Attending: Cardiovascular Disease | Admitting: Radiology

## 2012-03-17 DIAGNOSIS — Z01818 Encounter for other preprocedural examination: Secondary | ICD-10-CM

## 2012-03-17 DIAGNOSIS — I10 Essential (primary) hypertension: Secondary | ICD-10-CM | POA: Insufficient documentation

## 2012-03-17 DIAGNOSIS — I517 Cardiomegaly: Secondary | ICD-10-CM

## 2012-03-17 DIAGNOSIS — I369 Nonrheumatic tricuspid valve disorder, unspecified: Secondary | ICD-10-CM | POA: Insufficient documentation

## 2012-03-17 DIAGNOSIS — G4733 Obstructive sleep apnea (adult) (pediatric): Secondary | ICD-10-CM | POA: Insufficient documentation

## 2012-03-17 NOTE — Progress Notes (Signed)
Echocardiogram performed.  

## 2012-03-17 NOTE — Telephone Encounter (Signed)
Jamie [name], please, inform patient that her ECHO is OK OK for surgery Thx

## 2012-03-18 NOTE — Telephone Encounter (Signed)
Pt's husband informed. Echo faxed to [facility].

## 2012-03-20 ENCOUNTER — Ambulatory Visit: Admitting: Internal Medicine

## 2012-04-01 ENCOUNTER — Encounter (HOSPITAL_COMMUNITY): Payer: Self-pay | Admitting: Pharmacy Technician

## 2012-04-07 HISTORY — PX: KIDNEY SURGERY: SHX687

## 2012-04-08 ENCOUNTER — Ambulatory Visit (HOSPITAL_COMMUNITY)
Admission: RE | Admit: 2012-04-08 | Discharge: 2012-04-08 | Disposition: A | Discharge status: Home / Self Care | Source: Ambulatory Visit | Attending: Urology | Admitting: Urology

## 2012-04-08 ENCOUNTER — Encounter (HOSPITAL_COMMUNITY): Payer: Self-pay

## 2012-04-08 ENCOUNTER — Encounter (HOSPITAL_COMMUNITY)
Admission: RE | Admit: 2012-04-08 | Discharge: 2012-04-08 | Disposition: A | Discharge status: Home / Self Care | Source: Ambulatory Visit | Attending: Urology | Admitting: Urology

## 2012-04-08 DIAGNOSIS — G4730 Sleep apnea, unspecified: Secondary | ICD-10-CM | POA: Insufficient documentation

## 2012-04-08 DIAGNOSIS — K219 Gastro-esophageal reflux disease without esophagitis: Secondary | ICD-10-CM | POA: Insufficient documentation

## 2012-04-08 DIAGNOSIS — Z01812 Encounter for preprocedural laboratory examination: Secondary | ICD-10-CM | POA: Insufficient documentation

## 2012-04-08 DIAGNOSIS — Z01811 Encounter for preprocedural respiratory examination: Secondary | ICD-10-CM | POA: Insufficient documentation

## 2012-04-08 DIAGNOSIS — N289 Disorder of kidney and ureter, unspecified: Secondary | ICD-10-CM | POA: Insufficient documentation

## 2012-04-08 DIAGNOSIS — I10 Essential (primary) hypertension: Secondary | ICD-10-CM | POA: Insufficient documentation

## 2012-04-08 HISTORY — DX: Sleep apnea, unspecified: G47.30

## 2012-04-08 HISTORY — DX: Pneumonia, unspecified organism: J18.9

## 2012-04-08 HISTORY — DX: Anxiety disorder, unspecified: F41.9

## 2012-04-08 HISTORY — DX: Cardiac arrhythmia, unspecified: I49.9

## 2012-04-08 HISTORY — DX: Headache: R51

## 2012-04-08 LAB — SURGICAL PCR SCREEN: MRSA, PCR: NEGATIVE

## 2012-04-08 LAB — CBC
HCT: 37.6 % (ref 36.0–46.0)
Hemoglobin: 12.5 g/dL (ref 12.0–15.0)
MCV: 92.8 fL (ref 78.0–100.0)
RBC: 4.05 MIL/uL (ref 3.87–5.11)
WBC: 5.9 10*3/uL (ref 4.0–10.5)

## 2012-04-08 LAB — COMPREHENSIVE METABOLIC PANEL
Alkaline Phosphatase: 49 U/L (ref 39–117)
BUN: 15 mg/dL (ref 6–23)
CO2: 25 mEq/L (ref 19–32)
Chloride: 103 mEq/L (ref 96–112)
Creatinine, Ser: 0.72 mg/dL (ref 0.50–1.10)
GFR calc non Af Amer: 89 mL/min — ABNORMAL LOW (ref >90)
Potassium: 4.4 mEq/L (ref 3.5–5.1)
Total Bilirubin: 0.3 mg/dL (ref 0.3–1.2)

## 2012-04-08 NOTE — Patient Instructions (Addendum)
20 YENNI CARRA  04/08/2012   Your procedure is scheduled on:  04/13/12  Surgery 1200-1630   Monday   Report to [facility] at    0930   AM.  Call this number if you have problems the morning of surgery: [phone]      Remember: [facility]  Do not eat food  Or drink :After Midnight.SUNDAY NIGHT   Take these medicines the morning of surgery with A SIP OF WATER:   Nexium  BOWEL PREP AS PER OFFICE- Increase fluids Sunday and drink all day until midnight or bedtime Sunday night  Then none  .  Contacts, dentures or partial plates can not be worn to surgery  Leave suitcase in the car. After surgery it may be brought to your room.  For patients admitted to the hospital, checkout time is 11:00 AM day of  discharge.             SPECIAL INSTRUCTIONS- SEE [facility] PREPARING FOR SURGERY INSTRUCTION SHEET-     DO NOT WEAR JEWELRY, LOTIONS, POWDERS, OR PERFUMES.  WOMEN-- DO NOT SHAVE LEGS OR UNDERARMS FOR 12 HOURS BEFORE SHOWERS. MEN MAY SHAVE FACE.  Patients discharged the day of surgery will not be allowed to drive home. IF going home the day of surgery, you must have a driver and someone to stay with you for the first 24 hours  Name and phone number of your driver:                                                                        Please read over the following fact sheets that you were given: MRSA Information, Incentive Spirometry Sheet, Blood Transfusion Sheet  Information                                                                                   Hobie Kohles  PST [phone]  [phone]

## 2012-04-08 NOTE — Pre-Procedure Instructions (Signed)
EKG 9/13 chart,  Chest x ray 12/12 EPIC

## 2012-04-08 NOTE — Pre-Procedure Instructions (Signed)
2 D eccho 9/13 EPIC. States has not seen Dr Johnsie Cancel for murmur in years

## 2012-04-12 MED ORDER — LIDOCAINE HCL 1 % IJ SOLN
INTRAMUSCULAR | Status: AC
  Filled 2012-04-12: qty 20

## 2012-04-13 ENCOUNTER — Ambulatory Visit (HOSPITAL_COMMUNITY): Admitting: Anesthesiology

## 2012-04-13 ENCOUNTER — Inpatient Hospital Stay (HOSPITAL_COMMUNITY)
Admission: RE | Admit: 2012-04-13 | Discharge: 2012-04-16 | DRG: 657 | Disposition: A | Discharge status: Home / Self Care | Source: Ambulatory Visit | Attending: Urology | Admitting: Urology

## 2012-04-13 ENCOUNTER — Encounter (HOSPITAL_COMMUNITY)
Admission: RE | Disposition: A | Discharge status: Home / Self Care | Payer: Self-pay | Source: Ambulatory Visit | Attending: Urology

## 2012-04-13 ENCOUNTER — Encounter (HOSPITAL_COMMUNITY): Payer: Self-pay | Admitting: *Deleted

## 2012-04-13 ENCOUNTER — Encounter (HOSPITAL_COMMUNITY): Payer: Self-pay | Admitting: Anesthesiology

## 2012-04-13 DIAGNOSIS — Z6841 Body Mass Index (BMI) 40.0 and over, adult: Secondary | ICD-10-CM

## 2012-04-13 DIAGNOSIS — Z90710 Acquired absence of both cervix and uterus: Secondary | ICD-10-CM

## 2012-04-13 DIAGNOSIS — J309 Allergic rhinitis, unspecified: Secondary | ICD-10-CM

## 2012-04-13 DIAGNOSIS — E669 Obesity, unspecified: Secondary | ICD-10-CM | POA: Diagnosis present

## 2012-04-13 DIAGNOSIS — K589 Irritable bowel syndrome without diarrhea: Secondary | ICD-10-CM | POA: Diagnosis present

## 2012-04-13 DIAGNOSIS — Z825 Family history of asthma and other chronic lower respiratory diseases: Secondary | ICD-10-CM

## 2012-04-13 DIAGNOSIS — C649 Malignant neoplasm of unspecified kidney, except renal pelvis: Principal | ICD-10-CM | POA: Diagnosis present

## 2012-04-13 DIAGNOSIS — R0681 Apnea, not elsewhere classified: Secondary | ICD-10-CM | POA: Diagnosis present

## 2012-04-13 DIAGNOSIS — K219 Gastro-esophageal reflux disease without esophagitis: Secondary | ICD-10-CM | POA: Diagnosis present

## 2012-04-13 DIAGNOSIS — Z8249 Family history of ischemic heart disease and other diseases of the circulatory system: Secondary | ICD-10-CM

## 2012-04-13 DIAGNOSIS — J45909 Unspecified asthma, uncomplicated: Secondary | ICD-10-CM | POA: Diagnosis present

## 2012-04-13 LAB — TYPE AND SCREEN
ABO/RH(D): A POS
Antibody Screen: NEGATIVE

## 2012-04-13 LAB — BASIC METABOLIC PANEL
Calcium: 8.9 mg/dL (ref 8.4–10.5)
GFR calc Af Amer: >90 mL/min (ref >90)
GFR calc non Af Amer: 87 mL/min — ABNORMAL LOW (ref >90)
Glucose, Bld: 131 mg/dL — ABNORMAL HIGH (ref 70–99)
Potassium: 4 mEq/L (ref 3.5–5.1)
Sodium: 136 mEq/L (ref 135–145)

## 2012-04-13 LAB — CBC
Hemoglobin: 12.5 g/dL (ref 12.0–15.0)
MCHC: 34.2 g/dL (ref 30.0–36.0)
Platelets: 207 10*3/uL (ref 150–400)

## 2012-04-13 SURGERY — ROBOTIC ASSITED PARTIAL NEPHRECTOMY
Anesthesia: General | Site: Abdomen | Laterality: Right | Wound class: Clean Contaminated

## 2012-04-13 MED ORDER — MANNITOL 25 % IV SOLN
25.0000 g | Freq: Once | INTRAVENOUS | Status: AC
  Administered 2012-04-13 (×2): 12.5 g via INTRAVENOUS
  Filled 2012-04-13: qty 100

## 2012-04-13 MED ORDER — ROCURONIUM BROMIDE 100 MG/10ML IV SOLN
INTRAVENOUS | Status: DC | PRN
  Administered 2012-04-13 (×2): 10 mg via INTRAVENOUS
  Administered 2012-04-13: 50 mg via INTRAVENOUS
  Administered 2012-04-13 (×2): 10 mg via INTRAVENOUS

## 2012-04-13 MED ORDER — ONDANSETRON HCL 4 MG/2ML IJ SOLN
4.0000 mg | INTRAMUSCULAR | Status: DC | PRN
  Administered 2012-04-13 – 2012-04-15 (×2): 4 mg via INTRAVENOUS
  Filled 2012-04-13 (×3): qty 2

## 2012-04-13 MED ORDER — PROMETHAZINE HCL 25 MG/ML IJ SOLN: 6.2500 mg | INTRAMUSCULAR | Status: DC | PRN

## 2012-04-13 MED ORDER — METAXALONE 800 MG PO TABS
800.0000 mg | ORAL_TABLET | Freq: Every day | ORAL | Status: DC
  Administered 2012-04-13 – 2012-04-15 (×3): 800 mg via ORAL
  Filled 2012-04-13 (×4): qty 1

## 2012-04-13 MED ORDER — MEPERIDINE HCL 50 MG/ML IJ SOLN: 6.2500 mg | INTRAMUSCULAR | Status: DC | PRN

## 2012-04-13 MED ORDER — LIDOCAINE 5 % EX PTCH
1.0000 | MEDICATED_PATCH | CUTANEOUS | Status: DC
  Administered 2012-04-13 – 2012-04-15 (×3): 1 via TRANSDERMAL
  Filled 2012-04-13 (×4): qty 1

## 2012-04-13 MED ORDER — PANTOPRAZOLE SODIUM 40 MG PO TBEC
80.0000 mg | DELAYED_RELEASE_TABLET | Freq: Two times a day (BID) | ORAL | Status: DC
  Administered 2012-04-14 – 2012-04-16 (×5): 80 mg via ORAL
  Filled 2012-04-13 (×7): qty 2

## 2012-04-13 MED ORDER — CITRIC ACID-SODIUM CITRATE 334-500 MG/5ML PO SOLN
30.0000 mL | Freq: Once | ORAL | Status: AC
  Administered 2012-04-13: 30 mL via ORAL
  Filled 2012-04-13: qty 30

## 2012-04-13 MED ORDER — OXYCODONE HCL 5 MG PO TABS
5.0000 mg | ORAL_TABLET | ORAL | Status: DC | PRN
  Administered 2012-04-14 – 2012-04-15 (×4): 10 mg via ORAL
  Filled 2012-04-13 (×2): qty 1
  Filled 2012-04-13 (×3): qty 2

## 2012-04-13 MED ORDER — BUPIVACAINE LIPOSOME 1.3 % IJ SUSP
INTRAMUSCULAR | Status: DC | PRN
  Administered 2012-04-13: 20 mL

## 2012-04-13 MED ORDER — HYDROMORPHONE HCL PF 1 MG/ML IJ SOLN
INTRAMUSCULAR | Status: DC | PRN
  Administered 2012-04-13 (×2): 1 mg via INTRAVENOUS

## 2012-04-13 MED ORDER — PROPOFOL 10 MG/ML IV BOLUS
INTRAVENOUS | Status: DC | PRN
  Administered 2012-04-13: 200 mg via INTRAVENOUS

## 2012-04-13 MED ORDER — ONDANSETRON HCL 4 MG/2ML IJ SOLN
INTRAMUSCULAR | Status: DC | PRN
  Administered 2012-04-13: 4 mg via INTRAVENOUS

## 2012-04-13 MED ORDER — SENNA 8.6 MG PO TABS
1.0000 | ORAL_TABLET | Freq: Two times a day (BID) | ORAL | Status: DC
  Administered 2012-04-13 – 2012-04-16 (×5): 8.6 mg via ORAL
  Filled 2012-04-13 (×5): qty 1

## 2012-04-13 MED ORDER — CEFAZOLIN SODIUM-DEXTROSE 2-3 GM-% IV SOLR
2.0000 g | INTRAVENOUS | Status: AC
  Administered 2012-04-13: 2 g via INTRAVENOUS

## 2012-04-13 MED ORDER — STERILE WATER FOR IRRIGATION IR SOLN
Status: DC | PRN
  Administered 2012-04-13: 3000 mL

## 2012-04-13 MED ORDER — NEOSTIGMINE METHYLSULFATE 1 MG/ML IJ SOLN
INTRAMUSCULAR | Status: DC | PRN
  Administered 2012-04-13: 5 mg via INTRAVENOUS

## 2012-04-13 MED ORDER — CLONAZEPAM 1 MG PO TABS
1.0000 mg | ORAL_TABLET | Freq: Three times a day (TID) | ORAL | Status: DC | PRN
  Administered 2012-04-14 – 2012-04-15 (×4): 1 mg via ORAL
  Filled 2012-04-13 (×4): qty 1

## 2012-04-13 MED ORDER — POTASSIUM CHLORIDE IN NACL 20-0.9 MEQ/L-% IV SOLN
INTRAVENOUS | Status: AC
  Filled 2012-04-13: qty 1000

## 2012-04-13 MED ORDER — CITRIC ACID-SODIUM CITRATE 334-500 MG/5ML PO SOLN
30.0000 mL | Freq: Once | ORAL | Status: DC
  Filled 2012-04-13: qty 30

## 2012-04-13 MED ORDER — DOCUSATE SODIUM 100 MG PO CAPS
100.0000 mg | ORAL_CAPSULE | Freq: Two times a day (BID) | ORAL | Status: DC
  Administered 2012-04-13 – 2012-04-16 (×6): 100 mg via ORAL
  Filled 2012-04-13 (×7): qty 1

## 2012-04-13 MED ORDER — LACTATED RINGERS IR SOLN
Status: DC | PRN
  Administered 2012-04-13: 1000 mL

## 2012-04-13 MED ORDER — EPHEDRINE SULFATE 50 MG/ML IJ SOLN
INTRAMUSCULAR | Status: DC | PRN
  Administered 2012-04-13 (×3): 5 mg via INTRAVENOUS

## 2012-04-13 MED ORDER — HEMOSTATIC AGENTS (NO CHARGE) OPTIME
TOPICAL | Status: DC | PRN
  Administered 2012-04-13: 1 via TOPICAL

## 2012-04-13 MED ORDER — INDOCYANINE GREEN 25 MG IV SOLR
INTRAVENOUS | Status: DC | PRN
  Administered 2012-04-13: 5 mg via INTRAVENOUS

## 2012-04-13 MED ORDER — HYDROMORPHONE HCL PF 1 MG/ML IJ SOLN
INTRAMUSCULAR | Status: AC
  Administered 2012-04-14: 1 mg via INTRAVENOUS
  Filled 2012-04-13: qty 1

## 2012-04-13 MED ORDER — LACTATED RINGERS IV SOLN: INTRAVENOUS | Status: DC

## 2012-04-13 MED ORDER — BUPIVACAINE LIPOSOME 1.3 % IJ SUSP
20.0000 mL | Freq: Once | INTRAMUSCULAR | Status: DC
  Filled 2012-04-13: qty 20

## 2012-04-13 MED ORDER — LIDOCAINE HCL (CARDIAC) 20 MG/ML IV SOLN
INTRAVENOUS | Status: DC | PRN
  Administered 2012-04-13: 70 mg via INTRAVENOUS

## 2012-04-13 MED ORDER — METOPROLOL SUCCINATE ER 100 MG PO TB24
100.0000 mg | ORAL_TABLET | Freq: Every evening | ORAL | Status: DC
  Filled 2012-04-13 (×2): qty 1

## 2012-04-13 MED ORDER — PHENYLEPHRINE HCL 10 MG/ML IJ SOLN
INTRAMUSCULAR | Status: DC | PRN
  Administered 2012-04-13: 50 ug via INTRAVENOUS

## 2012-04-13 MED ORDER — SERTRALINE HCL 50 MG PO TABS
50.0000 mg | ORAL_TABLET | Freq: Every day | ORAL | Status: DC
  Administered 2012-04-14 – 2012-04-16 (×3): 50 mg via ORAL
  Filled 2012-04-13 (×3): qty 1

## 2012-04-13 MED ORDER — POTASSIUM CHLORIDE IN NACL 20-0.9 MEQ/L-% IV SOLN
INTRAVENOUS | Status: DC
  Administered 2012-04-13 – 2012-04-14 (×3): via INTRAVENOUS
  Filled 2012-04-13 (×4): qty 1000

## 2012-04-13 MED ORDER — CILIDINIUM-CHLORDIAZEPOXIDE 2.5-5 MG PO CAPS
1.0000 | ORAL_CAPSULE | Freq: Every day | ORAL | Status: DC
  Administered 2012-04-13 – 2012-04-15 (×3): 1 via ORAL
  Filled 2012-04-13 (×4): qty 1

## 2012-04-13 MED ORDER — ESTROGENS CONJUGATED 0.3 MG PO TABS
0.3000 mg | ORAL_TABLET | Freq: Every day | ORAL | Status: DC
  Administered 2012-04-13 – 2012-04-15 (×3): 0.3 mg via ORAL
  Filled 2012-04-13 (×4): qty 1

## 2012-04-13 MED ORDER — FENTANYL CITRATE 0.05 MG/ML IJ SOLN
INTRAMUSCULAR | Status: DC | PRN
  Administered 2012-04-13: 100 ug via INTRAVENOUS
  Administered 2012-04-13: 50 ug via INTRAVENOUS
  Administered 2012-04-13: 100 ug via INTRAVENOUS

## 2012-04-13 MED ORDER — ACETAMINOPHEN 10 MG/ML IV SOLN
1000.0000 mg | Freq: Four times a day (QID) | INTRAVENOUS | Status: AC
  Administered 2012-04-14 (×4): 1000 mg via INTRAVENOUS
  Filled 2012-04-13 (×4): qty 100

## 2012-04-13 MED ORDER — HYDROMORPHONE HCL PF 1 MG/ML IJ SOLN
0.5000 mg | INTRAMUSCULAR | Status: DC | PRN
  Administered 2012-04-13: 0.5 mg via INTRAVENOUS
  Administered 2012-04-14 – 2012-04-15 (×5): 1 mg via INTRAVENOUS
  Filled 2012-04-13 (×7): qty 1

## 2012-04-13 MED ORDER — OXYCODONE-ACETAMINOPHEN 5-325 MG PO TABS
1.0000 | ORAL_TABLET | ORAL | Status: DC | PRN
  Administered 2012-04-13: 2 via ORAL
  Filled 2012-04-13: qty 2

## 2012-04-13 MED ORDER — HYDROMORPHONE HCL PF 1 MG/ML IJ SOLN
0.2500 mg | INTRAMUSCULAR | Status: DC | PRN
  Administered 2012-04-13 (×3): 0.5 mg via INTRAVENOUS

## 2012-04-13 MED ORDER — LACTATED RINGERS IV SOLN
INTRAVENOUS | Status: DC
  Administered 2012-04-13: 15:00:00 via INTRAVENOUS
  Administered 2012-04-13: 1000 mL via INTRAVENOUS
  Administered 2012-04-13: 13:00:00 via INTRAVENOUS

## 2012-04-13 MED ORDER — MIDAZOLAM HCL 5 MG/5ML IJ SOLN
INTRAMUSCULAR | Status: DC | PRN
  Administered 2012-04-13: 2 mg via INTRAVENOUS

## 2012-04-13 MED ORDER — SUCCINYLCHOLINE CHLORIDE 20 MG/ML IJ SOLN
INTRAMUSCULAR | Status: DC | PRN
  Administered 2012-04-13: 100 mg via INTRAVENOUS

## 2012-04-13 MED ORDER — BUPIVACAINE-EPINEPHRINE PF 0.25-1:200000 % IJ SOLN
INTRAMUSCULAR | Status: AC
  Filled 2012-04-13: qty 30

## 2012-04-13 MED ORDER — CITRIC ACID-SODIUM CITRATE 334-500 MG/5ML PO SOLN
ORAL | Status: AC
  Filled 2012-04-13: qty 15

## 2012-04-13 MED ORDER — CEFAZOLIN SODIUM 1-5 GM-% IV SOLN
1.0000 g | Freq: Three times a day (TID) | INTRAVENOUS | Status: AC
  Administered 2012-04-13 – 2012-04-14 (×2): 1 g via INTRAVENOUS
  Filled 2012-04-13 (×2): qty 50

## 2012-04-13 MED ORDER — GLYCOPYRROLATE 0.2 MG/ML IJ SOLN
INTRAMUSCULAR | Status: DC | PRN
  Administered 2012-04-13: .8 mg via INTRAVENOUS

## 2012-04-13 MED ORDER — CEFAZOLIN SODIUM-DEXTROSE 2-3 GM-% IV SOLR
INTRAVENOUS | Status: AC
  Filled 2012-04-13: qty 50

## 2012-04-13 MED ORDER — FLUTICASONE PROPIONATE 50 MCG/ACT NA SUSP
2.0000 | Freq: Every day | NASAL | Status: DC | PRN
  Filled 2012-04-13: qty 16

## 2012-04-13 SURGICAL SUPPLY — 74 items
ADH SKN CLS APL DERMABOND .7 (GAUZE/BANDAGES/DRESSINGS) ×2
APL ESCP 34 STRL LF DISP (HEMOSTASIS)
APPLICATOR SURGIFLO ENDO (HEMOSTASIS) IMPLANT
BAG SPEC RTRVL LRG 6X4 10 (ENDOMECHANICALS) ×2
CANNULA SEAL DVNC (CANNULA) ×3 IMPLANT
CANNULA SEALS DA VINCI (CANNULA)
CHLORAPREP W/TINT 26ML (MISCELLANEOUS) ×3 IMPLANT
CLIP LIGATING HEM O LOK PURPLE (MISCELLANEOUS) ×4 IMPLANT
CLIP LIGATING HEMO LOK XL GOLD (MISCELLANEOUS) ×2 IMPLANT
CLIP LIGATING HEMO O LOK GREEN (MISCELLANEOUS) ×2 IMPLANT
CLOTH BEACON ORANGE TIMEOUT ST (SAFETY) ×3 IMPLANT
CORDS BIPOLAR (ELECTRODE) ×3 IMPLANT
COVER SURGICAL LIGHT HANDLE (MISCELLANEOUS) ×3 IMPLANT
COVER TIP SHEARS 8 DVNC (MISCELLANEOUS) ×2 IMPLANT
COVER TIP SHEARS 8MM DA VINCI (MISCELLANEOUS) ×1
CUTTER FLEX LINEAR 45M (STAPLE) ×1 IMPLANT
DECANTER SPIKE VIAL GLASS SM (MISCELLANEOUS) ×3 IMPLANT
DERMABOND ADVANCED (GAUZE/BANDAGES/DRESSINGS) ×1
DERMABOND ADVANCED .7 DNX12 (GAUZE/BANDAGES/DRESSINGS) ×3 IMPLANT
DRAIN CHANNEL 15F RND FF 3/16 (WOUND CARE) ×3 IMPLANT
DRAPE INCISE IOBAN 66X45 STRL (DRAPES) ×3 IMPLANT
DRAPE LAPAROSCOPIC ABDOMINAL (DRAPES) ×3 IMPLANT
DRAPE LG THREE QUARTER DISP (DRAPES) ×6 IMPLANT
DRAPE TABLE BACK 44X90 PK DISP (DRAPES) ×3 IMPLANT
DRAPE WARM FLUID 44X44 (DRAPE) ×3 IMPLANT
ELECT REM PT RETURN 9FT ADLT (ELECTROSURGICAL) ×6
ELECTRODE REM PT RTRN 9FT ADLT (ELECTROSURGICAL) ×4 IMPLANT
EVACUATOR SILICONE 100CC (DRAIN) ×3 IMPLANT
GAUZE VASELINE 3X9 (GAUZE/BANDAGES/DRESSINGS) IMPLANT
GLOVE BIOGEL M STRL SZ7.5 (GLOVE) ×6 IMPLANT
GOWN STRL NON-REIN LRG LVL3 (GOWN DISPOSABLE) ×6 IMPLANT
GOWN STRL REIN XL XLG (GOWN DISPOSABLE) ×3 IMPLANT
KIT ACCESSORY DA VINCI DISP (KITS) ×1
KIT ACCESSORY DVNC DISP (KITS) ×2 IMPLANT
KIT BASIN OR (CUSTOM PROCEDURE TRAY) ×3 IMPLANT
KIT PROCEDURE DA VINCI SI (MISCELLANEOUS) ×1
KIT PROCEDURE DVNC SI (MISCELLANEOUS) ×1 IMPLANT
LOOP VESSEL MAXI BLUE (MISCELLANEOUS) ×2 IMPLANT
NDL INSUFFLATION 14GA 120MM (NEEDLE) ×1 IMPLANT
NEEDLE INSUFFLATION 14GA 120MM (NEEDLE) ×3 IMPLANT
NS IRRIG 1000ML POUR BTL (IV SOLUTION) ×3 IMPLANT
PEN SKIN MARKING BROAD (MISCELLANEOUS) ×1 IMPLANT
PENCIL BUTTON HOLSTER BLD 10FT (ELECTRODE) ×3 IMPLANT
POSITIONER SURGICAL ARM (MISCELLANEOUS) ×6 IMPLANT
POUCH SPECIMEN RETRIEVAL 10MM (ENDOMECHANICALS) ×3 IMPLANT
RELOAD 45 VASCULAR/THIN (ENDOMECHANICALS) IMPLANT
RELOAD STAPLE 45 2.5 WHT GRN (ENDOMECHANICALS) ×3 IMPLANT
SET TUBE IRRIG SUCTION NO TIP (IRRIGATION / IRRIGATOR) IMPLANT
SOLUTION ANTI FOG 6CC (MISCELLANEOUS) ×3 IMPLANT
SOLUTION ELECTROLUBE (MISCELLANEOUS) ×3 IMPLANT
SPONGE GAUZE 4X4 12PLY (GAUZE/BANDAGES/DRESSINGS) ×2 IMPLANT
SPONGE LAP 18X18 X RAY DECT (DISPOSABLE) ×1 IMPLANT
SURGIFLO W/THROMBIN 8M KIT (HEMOSTASIS) ×2 IMPLANT
SUT ETHILON 3 0 PS 1 (SUTURE) ×1 IMPLANT
SUT MNCRL AB 4-0 PS2 18 (SUTURE) ×2 IMPLANT
SUT PDS AB 1 TP1 54 (SUTURE) ×2 IMPLANT
SUT V-LOC BARB 180 2/0GR6 GS22 (SUTURE) ×6
SUT VIC AB 0 CT1 27 (SUTURE) ×3
SUT VIC AB 0 CT1 27XBRD ANTBC (SUTURE) ×1 IMPLANT
SUT VICRYL 0 UR6 27IN ABS (SUTURE) ×2 IMPLANT
SUT VLOC BARB 180 ABS3/0GR12 (SUTURE) ×3
SUTURE V-LC BRB 180 2/0GR6GS22 (SUTURE) ×4 IMPLANT
SUTURE VLOC BRB 180 ABS3/0GR12 (SUTURE) ×2 IMPLANT
SYR BULB IRRIGATION 50ML (SYRINGE) IMPLANT
TAPE CLOTH SURG 4X10 WHT LF (GAUZE/BANDAGES/DRESSINGS) ×2 IMPLANT
TOWEL OR NON WOVEN STRL DISP B (DISPOSABLE) ×4 IMPLANT
TRAY FOLEY CATH 14FRSI W/METER (CATHETERS) ×1 IMPLANT
TRAY FOLEY CATH 16FR SILVER (SET/KITS/TRAYS/PACK) IMPLANT
TRAY FOLEY METER SIL LF 16FR (CATHETERS) ×2 IMPLANT
TRAY LAP CHOLE (CUSTOM PROCEDURE TRAY) ×3 IMPLANT
TROCAR ENDOPATH XCEL 12X100 BL (ENDOMECHANICALS) ×2 IMPLANT
TROCAR XCEL 12X100 BLDLESS (ENDOMECHANICALS) ×3 IMPLANT
TUBING INSUFFLATION 10FT LAP (TUBING) ×3 IMPLANT
WATER STERILE IRR 1500ML POUR (IV SOLUTION) ×6 IMPLANT

## 2012-04-13 NOTE — H&P (Signed)
Jamie [name] is an 66 y.o. female.   Chief Complaint: Pre-OP Right Renal Surgery HPI:   1 - Right Renal Mass - Pt with 3.5cm posterior, hilar, solid, enhancing renal mass worrisome for malignancy by CT imaging. Contralateral kidney normal. Normal creatinine. Appears 2 artery, 1 vein renovascular anatomy.  Today Jamie [name] presents for surgical management of her right renal mass.    Past Medical History  Diagnosis Date  . Asthma   . GERD (gastroesophageal reflux disease)   . Hypertension   . Obesity   . Allergic rhinitis   . IBS (irritable bowel syndrome)   . Fatty liver disease, nonalcoholic   . Adenomatous colon polyp   . Blood transfusion   . Ulcer   . Dysrhythmia 1999    Dr Johnsie Cancel 2003  . Anxiety   . Pneumonia 2007  . Headache     migraines with vision changes but no pain  . Heart murmur   . Cancer     kidney  . Sleep apnea     moderate    Past Surgical History  Procedure Date  . Abdominal hysterectomy 1979  . Tonsillectomy   . Breast reduction surgery Jan 1991  . Tonsillectomy     Family History  Problem Relation Age of Onset  . Heart disease Father 33  . Lung cancer Father   . Cancer Father 76    lung ca  . Heart disease Brother   . Asthma Neg Hx   . Hypertension Other   . Cancer Cousin     colon  . Allergies Daughter   . Allergies Brother   . Cancer Daughter     malignant melanoma   Social History:  reports that she has never smoked. She has never used smokeless tobacco. She reports that she drinks about 1.8 ounces of alcohol per week. She reports that she does not use illicit drugs.  Allergies:  Allergies  Allergen Reactions  . Aspirin     REACTION: ulcers  . Moxifloxacin     Tachycardia   . Latex Rash    No prescriptions prior to admission    No results found for this or any previous visit (from the past 48 hour(s)). No results found.  Review of Systems  Constitutional: Negative.   HENT: Negative.   Eyes: Negative.   Respiratory:  Negative.   Cardiovascular: Negative.   Gastrointestinal: Negative.   Genitourinary: Negative.   Musculoskeletal: Negative.   Skin: Negative.   Neurological: Negative.   Endo/Heme/Allergies: Negative.   Psychiatric/Behavioral: Negative.     There were no vitals taken for this visit. Physical Exam  Constitutional: She appears well-developed and well-nourished.       Moderate obesity  HENT:  Head: Normocephalic and atraumatic.  Eyes: Pupils are equal, round, and reactive to light.  Neck: Normal range of motion. Neck supple.  Cardiovascular: Normal rate.   Respiratory: Effort normal.  GI: Soft. Bowel sounds are normal.  Genitourinary:       No CVAT  Musculoskeletal: Normal range of motion.  Neurological: She is alert.  Skin: Skin is warm and dry.  Psychiatric: She has a normal mood and affect. Her behavior is normal. Judgment and thought content normal.     Assessment/Plan 1 - Right Renal Mass -  We have previously discussed management options in detail and the patient presents today for robotic partial nephrectomy. We have previously discussed that her tumor is in a sometimes technically challenging location and that if it was not  felt to be safe to perform partial we would proceed with radical nephrectomy. Risks and benefits have been extensively discussed previously and were re-iterated today as well as expected per-op course.  Jamie [name] 04/13/2012, 7:51 AM

## 2012-04-13 NOTE — Progress Notes (Signed)
Patient set up with CPAP using her home mask and tubing. Set at 12cm H2O preesure per patient home settings. Water added and 2L of O2 bled in. Patient states she is comfortable and RT instructed to call if  she needed any further assistance.

## 2012-04-13 NOTE — Brief Op Note (Signed)
04/13/2012  4:10 PM  PATIENT:  Jamie [name]  66 y.o. female  PRE-OPERATIVE DIAGNOSIS:  right renal mass  POST-OPERATIVE DIAGNOSIS:  right renal mass  PROCEDURE:  Procedure(s) (LRB) with comments: ROBOTIC ASSITED PARTIAL NEPHRECTOMY (Right) - with intraoperative ultrasound and indocyanine dye. nephropexy  SURGEON:  Surgeon(s) and Role:    * Alexis Frock, MD - Primary  PHYSICIAN ASSISTANT:   ASSISTANTS: Felipa Furnace PA   ANESTHESIA:   general  EBL:  Total I/O In: 2000 [I.V.:2000] Out: 475 [Urine:300; Blood:175]  BLOOD ADMINISTERED:none  DRAINS: (RLQ) Jackson-Pratt drain(s) with closed bulb suction in the RLQ   LOCAL MEDICATIONS USED:  OTHER Marcaine lipolized (Exparel 20cc)  SPECIMEN:  Source of Specimen:  Rt Renal Tumor  DISPOSITION OF SPECIMEN:  PATHOLOGY  COUNTS:  YES  TOURNIQUET:  * No tourniquets in log *  DICTATION: .Other Dictation: Dictation Number   [id number]  PLAN OF CARE: Admit to inpatient   PATIENT DISPOSITION:  PACU - hemodynamically stable.   Delay start of Pharmacological VTE agent (>24hrs) due to surgical blood loss or risk of bleeding: yes

## 2012-04-13 NOTE — Anesthesia Postprocedure Evaluation (Signed)
Anesthesia Post Note  Patient: Jamie [name]  Procedure(s) Performed: Procedure(s) (LRB): ROBOTIC ASSITED PARTIAL NEPHRECTOMY (Right)  Anesthesia type: General  Patient location: PACU  Post pain: Pain level controlled  Post assessment: Post-op Vital signs reviewed  Last Vitals: BP 116/48  Pulse 62  Temp 36.6 C (Oral)  Resp 14  Ht 5' 3"  (1.6 m)  Wt 227 lb 8.2 oz (103.2 kg)  BMI 40.30 kg/m2  SpO2 97%  Post vital signs: Reviewed  Level of consciousness: sedated  Complications: No apparent anesthesia complications

## 2012-04-13 NOTE — Anesthesia Preprocedure Evaluation (Signed)
Anesthesia Evaluation  Patient identified by MRN, date of birth, ID band Patient awake    Reviewed: Allergy & Precautions, H&P , NPO status , Patient's Chart, lab work & pertinent test results  Airway Mallampati: II TM Distance: >3 FB Neck ROM: Full    Dental No notable dental hx.    Pulmonary neg pulmonary ROS, sleep apnea and Continuous Positive Airway Pressure Ventilation , neg pneumonia -,  breath sounds clear to auscultation  Pulmonary exam normal       Cardiovascular hypertension, Pt. on medications negative cardio ROS  + dysrhythmias + Valvular Problems/Murmurs MVP Rhythm:Regular Rate:Normal     Neuro/Psych  Headaches, PSYCHIATRIC DISORDERS Anxiety negative neurological ROS  negative psych ROS   GI/Hepatic negative GI ROS, Neg liver ROS, GERD-  Medicated,  Endo/Other  negative endocrine ROS  Renal/GU negative Renal ROS  negative genitourinary   Musculoskeletal negative musculoskeletal ROS (+)   Abdominal   Peds negative pediatric ROS (+)  Hematology negative hematology ROS (+)   Anesthesia Other Findings   Reproductive/Obstetrics negative OB ROS                           Anesthesia Physical Anesthesia Plan  ASA: III  Anesthesia Plan: General   Post-op Pain Management:    Induction: Intravenous, Rapid sequence and Cricoid pressure planned  Airway Management Planned: Oral ETT  Additional Equipment:   Intra-op Plan:   Post-operative Plan: Extubation in OR  Informed Consent: I have reviewed the patients History and Physical, chart, labs and discussed the procedure including the risks, benefits and alternatives for the proposed anesthesia with the patient or authorized representative who has indicated his/her understanding and acceptance.   Dental advisory given  Plan Discussed with: CRNA  Anesthesia Plan Comments: (Pt having active reflux this AM. )         Anesthesia Quick Evaluation

## 2012-04-13 NOTE — Transfer of Care (Signed)
Immediate Anesthesia Transfer of Care Note  Patient: Jamie [name]  Procedure(s) Performed: Procedure(s) (LRB): ROBOTIC ASSITED PARTIAL NEPHRECTOMY (Right)  Patient Location: PACU  Anesthesia Type: General  Level of Consciousness: sedated, patient cooperative and responds to stimulaton  Airway & Oxygen Therapy: Patient Spontanous Breathing and Patient connected to face mask oxgen  Post-op Assessment: Report given to PACU RN and Post -op Vital signs reviewed and stable  Post vital signs: Reviewed and stable  Complications: No apparent anesthesia complications

## 2012-04-14 LAB — CBC
Hemoglobin: 10.6 g/dL — ABNORMAL LOW (ref 12.0–15.0)
Hemoglobin: 10.2 g/dL — ABNORMAL LOW (ref 12.0–15.0)
MCH: 31.2 pg (ref 26.0–34.0)
MCHC: 33.3 g/dL (ref 30.0–36.0)
MCV: 93.6 fL (ref 78.0–100.0)
Platelets: 187 10*3/uL (ref 150–400)
Platelets: 189 10*3/uL (ref 150–400)
RBC: 3.42 MIL/uL — ABNORMAL LOW (ref 3.87–5.11)
RBC: 3.27 MIL/uL — ABNORMAL LOW (ref 3.87–5.11)
WBC: 7.8 10*3/uL (ref 4.0–10.5)

## 2012-04-14 LAB — BASIC METABOLIC PANEL
CO2: 26 mEq/L (ref 19–32)
Chloride: 103 mEq/L (ref 96–112)
Glucose, Bld: 104 mg/dL — ABNORMAL HIGH (ref 70–99)
Potassium: 4.4 mEq/L (ref 3.5–5.1)
Sodium: 137 mEq/L (ref 135–145)

## 2012-04-14 MED ORDER — KETOROLAC TROMETHAMINE 15 MG/ML IJ SOLN
15.0000 mg | Freq: Four times a day (QID) | INTRAMUSCULAR | Status: DC
  Administered 2012-04-14 – 2012-04-16 (×9): 15 mg via INTRAVENOUS
  Filled 2012-04-14 (×12): qty 1

## 2012-04-14 MED ORDER — DIPHENHYDRAMINE HCL 25 MG PO CAPS
25.0000 mg | ORAL_CAPSULE | Freq: Four times a day (QID) | ORAL | Status: DC | PRN
  Administered 2012-04-14 – 2012-04-15 (×3): 25 mg via ORAL
  Filled 2012-04-14 (×3): qty 1

## 2012-04-14 MED ORDER — BELLADONNA ALKALOIDS-OPIUM 16.2-60 MG RE SUPP
1.0000 | Freq: Four times a day (QID) | RECTAL | Status: DC | PRN
  Administered 2012-04-14: 1 via RECTAL
  Filled 2012-04-14: qty 1

## 2012-04-14 NOTE — Progress Notes (Signed)
Pt BP 85/48.  HR 68. MD notified.  Will continue to monitor.

## 2012-04-14 NOTE — Progress Notes (Signed)
1 Day Post-Op  Subjective:  1 - Right Renal Mass s/p Robotic Partial Nephrectomy - No events overnight. Added scheduled IV tylenol. Hgb and Cr acceptable. JP output minimal. Mild left shoulder soreness (known bursitis) but no change in range of motion.  Objective: Vital signs in last 24 hours: Temp:  [97.6 F (36.4 C)-98.2 F (36.8 C)] 97.9 F (36.6 C) (10/08 0508) Pulse Rate:  [58-83] 83  (10/08 0508) Resp:  [12-18] 18  (10/08 0508) BP: (91-119)/(41-70) 102/49 mmHg (10/08 0508) SpO2:  [96 %-100 %] 97 % (10/08 0508) Weight:  [103.2 kg (227 lb 8.2 oz)] 103.2 kg (227 lb 8.2 oz) (10/07 1730) Last BM Date: 04/13/12  Intake/Output from previous day: 10/07 0701 - 10/08 0700 In: 4253.8 [P.O.:480; I.V.:3673.8; IV Piggyback:100] Out: 1620 [Urine:1350; Drains:95; Blood:175] Intake/Output this shift:    General appearance: alert, cooperative, appears stated age and CPAP in place Head: Normocephalic, without obvious abnormality, atraumatic Nose: Nares normal. Septum midline. Mucosa normal. No drainage or sinus tenderness. Throat: lips, mucosa, and tongue normal; teeth and gums normal Back: symmetric, no curvature. ROM normal. No CVA tenderness. Resp: clear to auscultation bilaterally Cardio: regular rate and rhythm, S1, S2 normal, no murmur, click, rub or gallop GI: soft, non-tender; bowel sounds normal; no masses,  no organomegaly Extremities: extremities normal, atraumatic, no cyanosis or edema Pulses: 2+ and symmetric Skin: Skin color, texture, turgor normal. No rashes or lesions Lymph nodes: Cervical, supraclavicular, and axillary nodes normal. Neurologic: Grossly normal Incision/Wound: Port sites + extraction sites c/d/i. JP with minimal serous output as expected.  Lab Results:   Basename 04/14/12 0413 04/13/12 1636  WBC 7.8 12.8*  HGB 10.6* 12.5  HCT 32.0* 36.6  PLT 187 207   BMET  Basename 04/14/12 0413 04/13/12 1636  NA 137 136  K 4.4 4.0  CL 103 103  CO2 26 23    GLUCOSE 104* 131*  BUN 9 9  CREATININE 1.03 0.78  CALCIUM 8.6 8.9   PT/INR No results found for this basename: LABPROT:2,INR:2 in the last 72 hours ABG No results found for this basename: PHART:2,PCO2:2,PO2:2,HCO3:2 in the last 72 hours  Studies/Results: No results found.  Anti-infectives: Anti-infectives     Start     Dose/Rate Route Frequency Ordered Stop   04/13/12 2000   ceFAZolin (ANCEF) IVPB 1 g/50 mL premix        1 g 100 mL/hr over 30 Minutes Intravenous Every 8 hours 04/13/12 1738 04/14/12 0531   04/13/12 0935   ceFAZolin (ANCEF) IVPB 2 g/50 mL premix        2 g 100 mL/hr over 30 Minutes Intravenous 30 min pre-op 04/13/12 0935 04/13/12 1214          Assessment/Plan: 1 - Right Renal Mass s/p Robotic Partial Nephrectomy - Doing well POD1 -Ambulate several times today -DC Foley -DC daily labs -JP to remain -Advance diet as tolerated to regular -Possible DC as soon as tomorrow if meeting goals of nutrition, ambulation, pain control.      LOS: 1 day    [facility], Korbin Notaro 04/14/2012

## 2012-04-14 NOTE — Op Note (Addendum)
NAMEJEIDI, GILLES NO.:  [id number]  MEDICAL RECORD NO.:  [id number]  LOCATION:  6063                         FACILITY:  [facility]  PHYSICIAN:  Alexis Frock, MD     DATE OF BIRTH:  07/28/1945  DATE OF PROCEDURE: DATE OF DISCHARGE:                              OPERATIVE REPORT   DIAGNOSIS:  Right renal mass.  PROCEDURES: 1. Right robotic-assisted laparoscopic partial nephrectomy. 2. ICG dye injection for vascular flow and function. 3. Intraoperative ultrasound with interpretation. 4. Right robotic-assisted laparoscopic nephropexy.  ASSISTANT: Felipa Furnace, PA  SPECIMENS: 1. Right renal tumor. 2. Base of renal tumor 1. 3. Base of renal tumor 2.  FINDINGS: 1. Two artery, one vein, right renovascular anatomy. 2. Approximately 50% endophytic renal mass posterior to the hilum on  intraoperative ultrasound. 3. Hypofluorescent mass on ICG dye imaging  ESTIMATED BLOOD LOSS:  50 mL.  COMPLICATIONS:  None.  WARM ISCHEMIA TIME:  20 minutes.  DRAINS: 1. Jackson-Pratt drain to bulb suction. 2. Foley catheter straight drain.  INDICATION:  Jamie [name] is a very pleasant 66 year old female who was found to have enhancing solid right renal mass on axial imaging, which was further corroborated with MRI.  This was clearly worrisome for renal malignancy.  Options were discussed with the patient in terms of management including active surveillance versus ablative therapies versus surgical extirpation with and without nephron sparing and with and without minimally invasive assistance.  The patient after considering the range of options was elected to undergo right robotic partial nephrectomy.  Informed consent was obtained and placed in the medical record.  PROCEDURE IN DETAIL:  The patient being, Jamie [name], verified; procedure being right-sided robotic partial nephrectomy was confirmed. Procedure was carried out.  Time-out was performed.   Intravenous antibiotics were administered.  General endotracheal anesthesia was introduced.  The patient was placed into a right side up full flank position and flank 15 degrees of table flexion and superior arm elevator. The patient's bottom of leg was bent and top lag was straight. Compression devices were placed.  Beanbag was deployed.  The Foley catheter previously been placed while the patient was still supine.  After sterile field was created by prepping and draping the patient's right entire flank and the frees the xiphoid, abdomen using ChloraPrep x2. Next, a high-flow low pressure pneumoperitoneum was obtained using Veress technique in the right lower quadrant having passed the aspiration and drop test.  A 12-mm robotic camera port was then placed into position approximately 4 fingerbreadths superolateral to the umbilicus.  Laparoscope was then inserted into the peritoneal cavity which revealed no visceral injury and no excessive adhesions, additional ports were in place as follows.  An inferior paramedian 8-mm robotic port, a right far lateral 8-mm robotic port approximately 4 fingerbreadths superior medial to the anterior superior iliac spine, a right paramedian 8-mm robotic port approximately 2 fingerbreadths below the costal margin, and 12-mm assistant port approximately 4 fingerbreadths inferomedial to the umbilicus, and noted to the camera port and a 5-mm liver retraction port 1 fingerbreadth below the costal margin and lateral to 8-mm robotic port that was superior.  Robot was then docked and passed its electronic checks.  A self-retaining retractor was placed placing the liver on very gentle superior traction and away from the area of the kidney. Retroperitoneum was then developed by incising lateral to the ascending colon from the area of the cecum and towards the area of the hepatic flexure and mobilizing medially.  The duodenum was encountered and very carefully  Kocherized medially as well.  The inferior pole of the kidney was identified and placed under gentle lateral traction and dissection was then proceeded medial to this.  Ureter was encountered and also placed into a very gentle lateral traction.  Dissection was then proceeded superiorly in this triangle towards the renal hilum.  The renal hilum was encountered and consisted of single vein, two artery, renovascular anatomy.  The arteries were circumferentially mobilized and marked with vessel loops.  Given that the mass was completely posterior, the kidney was 100% mobilized laterally and superiorly and it was flipped 180 degrees, medially exposing the posterior side of the kidney. This was defatted and the mass in question was localized and was indeed hilar and very near the renal vasculature.  Intraoperative ultrasound was then performed.  Intraoperative ultrasound corroborated a hyperechoic solid mass.  The endophytic extent of the mass was noted using ultrasound and marked externally on the posterior surface of the kidney signifying the presumed medial margin with the anatomy appeared to be favorable, the decision was made to proceed with partial nephrectomy.  As such, 12.5 grams of mannitol was given intravenously, 2 mL of indocyanine green was given, which clearly corroborated two artery, one vein renovascular anatomy and a mass that was hypofluorescent  relative to surrounding parenchyma.  The arteries were  then clamped with a single bulldog clamp eash as was the vein.  Partial nephrectomy was then performed by once again flipping the kidney medailly and very carefully incising using cold scissors throughout the entire resection of the mass and keeping what appeared to be removed normal parenchymal tissue with the specimen.  Renorrhaphy was performed, the first layer using running 3- 0 V-Loc oversewing any obvious open vessels, or entrances into the collecting system.  Hemoclips were  applied at each end.  Next, Second layer thenorraphy was performed using double-armed 2-0 V-Loc in running fashion.  This provided excellent parenchymal apposition, 5 mL of FloSeal was then applied directly onto this.  The arteries and veins were then unclamped for total warm ischemia time of 20 minutes.  Hemostasis appeared excellent.  As the kidney had been completely mobilized, nephropexy was then performed by using a separate 2-0 V-Loc in a figure-of-eight fashion x3 in the lateral border of the renal capsule and the abominal side wall.  This reapproximated pre-mobilization anatomy.  Specimen was placed into endoscopic retrieval bag.  All sponge and needle counts were correct.  A closed suction drain was brought out through the previously 8-mm right lateral ports.  Port was then docked.  Specimen was retrieved by extending the assistant port site to approximately 2 cm, passing out the specimen in its entirety.  Notably during partial nephrectomy, two separate specimens of the base were taken #1 and 2, also for permanent pathology.  The extraction site was closed at the level of the fascia using figure-of-eight Vicryl x2.  The camera port was closed at the level of the fascia using figure-of-eight Vicryl x1.  Drain stitch was applied.  All skin incisions were then reapproximated using subcuticular Monocryl followed by Dermabond.  The procedure was then terminated.  The patient tolerated the procedure well.  There were no immediate periprocedural complications.  The patient was taken to the Postanesthesia Care Unit in stable condition.            ______________________________ Alexis Frock, MD     TM/MEDQ  D:  04/13/2012  T:  04/14/2012  Job:  [id number]

## 2012-04-15 NOTE — Care Management Note (Unsigned)
    Page 1 of 1   04/15/2012     9:02:59 PM   CARE MANAGEMENT NOTE 04/15/2012  Patient:  Jamie [name], Jamie [name]   Account Number:  [id number]  Date Initiated:  04/15/2012  Documentation initiated by:  Dessa Phi  Subjective/Objective Assessment:   ADMITTED W/R RENAL MASS.     Action/Plan:   FROM HOME   Anticipated DC Date:  04/16/2012   Anticipated DC Plan:  [place]  CM consult      Choice offered to / List presented to:             Status of service:  In process, will continue to follow Medicare Important Message given?   (If response is "NO", the following Medicare IM given date fields will be blank) Date Medicare IM given:   Date Additional Medicare IM given:    Discharge Disposition:    Per UR Regulation:  Reviewed for med. necessity/level of care/duration of stay  If discussed at [place] of Stay Meetings, dates discussed:    Comments:  04/15/12 [facility] Senna Lape RN,BSN [phone] [phone]

## 2012-04-15 NOTE — Progress Notes (Signed)
2 Days Post-Op  Subjective:  1 - Right Renal Mass s/p Robotic Partial Nephrectomy - No events overnight. JP output minimal. Mild left shoulder soreness (known bursitis) improved. Tollerating clears and ambulatory some yesterday, but not much. Voiding well w/o foley.  Objective: Vital signs in last 24 hours: Temp:  [97.6 F (36.4 C)-98.5 F (36.9 C)] 97.6 F (36.4 C) (10/08 2146) Pulse Rate:  [63-79] 63  (10/08 2146) Resp:  [18] 18  (10/08 2146) BP: (82-89)/(48-49) 89/48 mmHg (10/08 2146) SpO2:  [90 %-94 %] 94 % (10/08 2146) Last BM Date: 04/13/12  Intake/Output from previous day: 10/08 0701 - 10/09 0700 In: 2380 [P.O.:480; I.V.:1800; IV Piggyback:100] Out: 1080 [Urine:1050; Drains:30] Intake/Output this shift: Total I/O In: 900 [I.V.:900] Out: 365 [Urine:350; Drains:15]  General appearance: alert, cooperative and appears stated age Head: Normocephalic, without obvious abnormality, atraumatic Eyes: conjunctivae/corneas clear. PERRL, EOM's intact. Fundi benign. Nose: Nares normal. Septum midline. Mucosa normal. No drainage or sinus tenderness. Throat: lips, mucosa, and tongue normal; teeth and gums normal Back: symmetric, no curvature. ROM normal. No CVA tenderness. Resp: clear to auscultation bilaterally and normal percussion bilaterally Cardio: regular rate and rhythm, S1, S2 normal, no murmur, click, rub or gallop GI: soft, non-tender; bowel sounds normal; no masses,  no organomegaly Extremities: extremities normal, atraumatic, no cyanosis or edema Pulses: 2+ and symmetric Skin: Skin color, texture, turgor normal. No rashes or lesions Lymph nodes: Cervical, supraclavicular, and axillary nodes normal. Neurologic: Grossly normal Incision/Wound: Port sites, extraction sites c/d/i. JP with minimal serous output.  Lab Results:   [facility] 04/14/12 1642 04/14/12 0413  WBC 9.2 7.8  HGB 10.2* 10.6*  HCT 30.6* 32.0*  PLT 189 187   BMET  Basename 04/14/12 0413 04/13/12 1636    NA 137 136  K 4.4 4.0  CL 103 103  CO2 26 23  GLUCOSE 104* 131*  BUN 9 9  CREATININE 1.03 0.78  CALCIUM 8.6 8.9   PT/INR No results found for this basename: LABPROT:2,INR:2 in the last 72 hours ABG No results found for this basename: PHART:2,PCO2:2,PO2:2,HCO3:2 in the last 72 hours  Studies/Results: No results found.  Anti-infectives: Anti-infectives     Start     Dose/Rate Route Frequency Ordered Stop   04/13/12 2000   ceFAZolin (ANCEF) IVPB 1 g/50 mL premix        1 g 100 mL/hr over 30 Minutes Intravenous Every 8 hours 04/13/12 1738 04/14/12 0531   04/13/12 0935   ceFAZolin (ANCEF) IVPB 2 g/50 mL premix        2 g 100 mL/hr over 30 Minutes Intravenous 30 min pre-op 04/13/12 0935 04/13/12 1214          Assessment/Plan: 1 - Right Renal Mass s/p Robotic Partial Nephrectomy - Doing well POD2 -Ambulate several times today, STRONGLY reinforced to pt -JP to remain -Advance diet as tolerated to regular -Possible DC as soon as tomorrow if meeting goals of nutrition, ambulation, pain control. Today likely too soon as slow to ambulate.  LOS: 2 days    [facility], Chanele Douglas 04/15/2012

## 2012-04-16 MED ORDER — OXYCODONE HCL 5 MG PO TABS
10.0000 mg | ORAL_TABLET | ORAL | Status: DC | PRN
  Administered 2012-04-16 (×2): 10 mg via ORAL
  Filled 2012-04-16 (×2): qty 2

## 2012-04-16 MED ORDER — OXYCODONE-ACETAMINOPHEN 5-325 MG PO TABS: 1.0000 | ORAL_TABLET | ORAL | Status: DC | PRN

## 2012-04-16 MED ORDER — CLONAZEPAM 1 MG PO TABS: 1.0000 mg | ORAL_TABLET | Freq: Every evening | ORAL | Status: DC | PRN

## 2012-04-16 MED ORDER — SENNA-DOCUSATE SODIUM 8.6-50 MG PO TABS: 1.0000 | ORAL_TABLET | Freq: Two times a day (BID) | ORAL | Status: DC

## 2012-04-16 NOTE — Discharge Summary (Signed)
Physician Discharge Summary  Patient ID: Jamie [name] MRN: [id number] DOB/AGE: 12/30/1945 66 y.o.  Admit date: 04/13/2012 Discharge date: 04/16/2012  Admission Diagnoses: Rt Renal Neoplasm  Discharge Diagnoses: Ret Renal Cancer s/p resection   Discharged Condition: good  Hospital Course:  Pt underwent right robotic partial nephrectomy on 10/7 the day of admission without acute complications. She was admitted to the [facility] service where she began her vigorous recovery. She began tollerating a diet post-op day 1. Post-op day 2 she began ambulating with better pain control. By post-op day 3, the day of discharge, she was tollerating a regular diet, pain controlled with PO meds, had JP drain removed as output scant, was ambulatory and cleared by physical therapy for discharge w/o additional needs. Her final pathology returned pT1a renal cell carcinoma with negative margins and this was communicated prior to discharge.    Consults: Physical Therapy  Significant Diagnostic Studies: labs: Hgb >10. Pathology - renal cell carcinoma  Treatments: IV hydration and surgery: Rt robotic partial nephrectomy  Discharge Exam: Blood pressure 115/61, pulse 66, temperature 97.7 F (36.5 C), temperature source Oral, resp. rate 17, height 5' 3"  (1.6 m), weight 103.2 kg (227 lb 8.2 oz), SpO2 94.00%. General appearance: alert, cooperative and Husband at bedside Head: Normocephalic, without obvious abnormality, atraumatic Eyes: conjunctivae/corneas clear. PERRL, EOM's intact. Fundi benign. Ears: normal TM's and external ear canals both ears Nose: Nares normal. Septum midline. Mucosa normal. No drainage or sinus tenderness. Throat: lips, mucosa, and tongue normal; teeth and gums normal Back: symmetric, no curvature. ROM normal. No CVA tenderness. Resp: clear to auscultation bilaterally Chest wall: no tenderness Cardio: regular rate and rhythm, S1, S2 normal, no murmur, click, rub or gallop GI:  soft, non-tender; bowel sounds normal; no masses,  no organomegaly Extremities: extremities normal, atraumatic, no cyanosis or edema Pulses: 2+ and symmetric Skin: Skin color, texture, turgor normal. No rashes or lesions Lymph nodes: Cervical, supraclavicular, and axillary nodes normal. Neurologic: Grossly normal Incision/Wound: Recent port sites and drain sites c/d/i. No hernias.  Disposition: 01-Home or Self Care  Discharge Orders    Future Appointments: Provider: Department: Dept Phone: Center:   06/15/2012 1:15 PM Cassandria Anger, MD [facility] [phone] [facility]       Medication List     As of 04/16/2012  5:19 PM    STOP taking these medications         clidinium-chlordiazePOXIDE 2.5-5 MG per capsule   Commonly known as: LIBRAX      TAKE these medications         clonazePAM 1 MG tablet   Commonly known as: KLONOPIN   Take 1 tablet (1 mg total) by mouth at bedtime as needed. sleep      esomeprazole 40 MG capsule   Commonly known as: NEXIUM   Take 40 mg by mouth 2 (two) times daily.      estrogens (conjugated) 0.3 MG tablet   Commonly known as: PREMARIN   Take 0.3 mg by mouth daily.      fluticasone 50 MCG/ACT nasal spray   Commonly known as: FLONASE   Place 2 sprays into the nose daily as needed.      metaxalone 800 MG tablet   Commonly known as: SKELAXIN   Take 1 tablet (800 mg total) by mouth at bedtime.      metoprolol succinate 100 MG 24 hr tablet   Commonly known as: TOPROL-XL   Take 100 mg by mouth every evening. Take with or immediately  following a meal.      multivitamin with minerals Tabs   Take 1 tablet by mouth daily.      olmesartan 40 MG tablet   Commonly known as: BENICAR   Take 1 tablet (40 mg total) by mouth every evening.      oxyCODONE-acetaminophen 5-325 MG per tablet   Commonly known as: PERCOCET/ROXICET   Take 1-2 tablets by mouth every 4 (four) hours as needed for pain (post-operative).      sennosides-docusate sodium  8.6-50 MG tablet   Commonly known as: SENOKOT-S   Take 1 tablet by mouth 2 (two) times daily. While taking pain meds to prevent constipation      sertraline 50 MG tablet   Commonly known as: ZOLOFT   Take 50 mg by mouth daily.      spironolactone 50 MG tablet   Commonly known as: ALDACTONE   Take 50 mg by mouth daily before breakfast.           Follow-up Information    Follow up with Alexis Frock, MD. On 04/27/2012. (at 9:30 AM)    Contact information:   [address], [facility] [place] [address] [phone]          Signed: Alexis Frock 04/16/2012, 5:19 PM

## 2012-04-16 NOTE — Progress Notes (Signed)
3 Days Post-Op  Subjective:  1 - Right Renal Mass s/p Robotic Partial Nephrectomy - No events overnight. JP output minimal. Mild left shoulder soreness (known bursitis) improved. Tollerating clears and ambulatory some yesterday, but not much. Voiding well w/o foley. 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  JP removed this AM.  Pt still with trouble with transitions in/out of bed and worried about going home with her level of support.  Objective: Vital signs in last 24 hours: Temp:  [98.7 F (37.1 C)-99.8 F (37.7 C)] 98.7 F (37.1 C) (10/10 0506) Pulse Rate:  [76-110] 110  (10/10 0506) Resp:  [16-22] 18  (10/10 0506) BP: (93-137)/(49-75) 137/75 mmHg (10/10 0506) SpO2:  [93 %-100 %] 100 % (10/10 0506) Last BM Date: 04/13/12  Intake/Output from previous day: 10/09 0701 - 10/10 0700 In: 240 [P.O.:240] Out: 3235 [Urine:3200; Drains:35] Intake/Output this shift:    General appearance: alert, cooperative and appears stated age Head: Normocephalic, without obvious abnormality, atraumatic Eyes: conjunctivae/corneas clear. PERRL, EOM's intact. Fundi benign. Ears: normal TM's and external ear canals both ears Back: symmetric, no curvature. ROM normal. No CVA tenderness. Resp: clear to auscultation bilaterally Cardio: regular rate and rhythm, S1, S2 normal, no murmur, click, rub or gallop GI: soft, non-tender; bowel sounds normal; no masses,  no organomegaly Extremities: extremities normal, atraumatic, no cyanosis or edema Pulses: 2+ and symmetric Skin: Skin color, texture, turgor normal. No rashes or lesions Lymph nodes: Cervical, supraclavicular, and axillary nodes normal. Neurologic: Grossly normal Incision/Wound: REcent port sites c/d/i. JP with minimal serous output, removed today by MD and dressed with dry dressing.  Lab Results:   [facility] 04/14/12 1642 04/14/12 0413  WBC 9.2 7.8  HGB 10.2* 10.6*  HCT 30.6* 32.0*  PLT 189 187   BMET  Basename 04/14/12  0413 04/13/12 1636  NA 137 136  K 4.4 4.0  CL 103 103  CO2 26 23  GLUCOSE 104* 131*  BUN 9 9  CREATININE 1.03 0.78  CALCIUM 8.6 8.9   PT/INR No results found for this basename: LABPROT:2,INR:2 in the last 72 hours ABG No results found for this basename: PHART:2,PCO2:2,PO2:2,HCO3:2 in the last 72 hours  Studies/Results: No results found.  Anti-infectives: Anti-infectives     Start     Dose/Rate Route Frequency Ordered Stop   04/13/12 2000   ceFAZolin (ANCEF) IVPB 1 g/50 mL premix        1 g 100 mL/hr over 30 Minutes Intravenous Every 8 hours 04/13/12 1738 04/14/12 0531   04/13/12 0935   ceFAZolin (ANCEF) IVPB 2 g/50 mL premix        2 g 100 mL/hr over 30 Minutes Intravenous 30 min pre-op 04/13/12 0935 04/13/12 1214          Assessment/Plan: - PT today to help determine if home v. Rehab and if need for medical equip -JP out today -Increase prn oxy to 37m Q3 - Remain in house, reinforced importance of ambulation.   LOS: 3 days    Jamie [name] 04/16/2012

## 2012-04-16 NOTE — Evaluation (Addendum)
Physical Therapy Evaluation Patient Details Name: Jamie [name] MRN: [id number] DOB: 10/04/1945 Today's Date: 04/16/2012 Time: 7564-3329 PT Time Calculation (min): 20 min  PT Assessment / Plan / Recommendation Clinical Impression  66 y.o. female s/p right partial nephrectomy. Pt ambulated 400' without assistive device, with no LOB. Pt independent in bed mobility and transfers. Encouraged frequent ambulation. No DME needed, no f/u PT needed. OK to DC home from PT standpoint.    PT Assessment  Patent does not need any further PT services    Follow Up Recommendations  No PT follow up    Does the patient have the potential to tolerate intense rehabilitation      Barriers to Discharge        Equipment Recommendations  None recommended by PT    Recommendations for Other Services     Frequency      Precautions / Restrictions Precautions Precautions: None Restrictions Weight Bearing Restrictions: No   Pertinent Vitals/Pain *3/10 R side, surgical site while walking premedicated**      Mobility  Bed Mobility Bed Mobility: Rolling Left;Left Sidelying to Sit Rolling Left: 7: Independent Left Sidelying to Sit: 7: Independent Details for Bed Mobility Assistance: instructed pt in log roll to left, pt able to  perform roll and sidelying to sit independently (without rail) Transfers Transfers: Sit to Stand;Stand to Sit Sit to Stand: 7: Independent Stand to Sit: 7: Independent Ambulation/Gait Ambulation/Gait Assistance: 7: Independent Ambulation Distance (Feet): 400 Feet Assistive device: None Gait Pattern: Within Functional Limits General Gait Details: No LOB, no assistive device. Encouraged frequent ambulation.    Shoulder Instructions     Exercises     PT Diagnosis:    PT Problem List:   PT Treatment Interventions:     PT Goals    Visit Information  Last PT Received On: 04/16/12 Assistance Needed: +1    Subjective Data  Subjective: I'm worried about getting  out of bed at home.  Patient Stated Goal: to go home   Prior [place] Lives With: Spouse Available Help at Discharge: Family Type of Home: House Home Access: Stairs to enter Technical brewer of Steps: 2 Entrance Stairs-Rails: None Home Layout: Two level Alternate Level Stairs-Number of Steps: 14 Alternate Level Stairs-Rails: Right Bathroom Shower/Tub: Ambulance person: None Prior Function Level of Independence: Independent Able to Take Stairs?: Yes Driving: Yes Communication Communication: No difficulties    Cognition  Overall Cognitive Status: Appears within functional limits for tasks assessed/performed Arousal/Alertness: Awake/alert Orientation Level: Appears intact for tasks assessed Behavior During Session: [facility] for tasks performed    Extremity/Trunk Assessment Right Upper Extremity Assessment RUE ROM/Strength/Tone: [facility] for tasks assessed Left Upper Extremity Assessment LUE ROM/Strength/Tone: WFL for tasks assessed Right Lower Extremity Assessment RLE ROM/Strength/Tone: Within functional levels RLE Sensation: WFL - Light Touch RLE Coordination: WFL - gross/fine motor Left Lower Extremity Assessment LLE ROM/Strength/Tone: Within functional levels LLE Sensation: WFL - Light Touch LLE Coordination: WFL - gross/fine motor Trunk Assessment Trunk Assessment: Normal   Balance    End of Session PT - End of Session Activity Tolerance: Patient tolerated treatment well Patient left: in chair;with call bell/phone within reach Nurse Communication: Mobility status  GP     Blondell Reveal Kistler 04/16/2012, 1:22 PM [phone]

## 2012-04-16 NOTE — Progress Notes (Addendum)
Noted pt dressing around JP site  moist and leaking. Cleansed area and reapplied new gauze drain dressings. Will Continue to monitor site for drainage and leakage.  M.D. In to see patient and remove JP Drain, See new orders. Will follow up with dayshift nurse.

## 2012-06-15 ENCOUNTER — Ambulatory Visit: Admitting: Internal Medicine

## 2012-07-23 ENCOUNTER — Other Ambulatory Visit: Payer: Self-pay | Admitting: Internal Medicine

## 2012-09-15 ENCOUNTER — Ambulatory Visit (INDEPENDENT_AMBULATORY_CARE_PROVIDER_SITE_OTHER): Payer: PRIVATE HEALTH INSURANCE | Admitting: Internal Medicine

## 2012-09-15 ENCOUNTER — Encounter: Payer: Self-pay | Admitting: Internal Medicine

## 2012-09-15 VITALS — BP 134/72 | HR 60 | Ht 63.0 in | Wt 215.0 lb

## 2012-09-15 DIAGNOSIS — Z86010 Personal history of colonic polyps: Secondary | ICD-10-CM

## 2012-09-15 DIAGNOSIS — K760 Fatty (change of) liver, not elsewhere classified: Secondary | ICD-10-CM

## 2012-09-15 MED ORDER — ESOMEPRAZOLE MAGNESIUM 40 MG PO CPDR: 40.0000 mg | DELAYED_RELEASE_CAPSULE | Freq: Two times a day (BID) | ORAL | Status: DC

## 2012-09-15 MED ORDER — CILIDINIUM-CHLORDIAZEPOXIDE 2.5-5 MG PO CAPS: 1.0000 | ORAL_CAPSULE | Freq: Two times a day (BID) | ORAL | Status: DC

## 2012-09-15 NOTE — Patient Instructions (Addendum)
We have sent the following medications to your pharmacy for you to pick up at your convenience:  Librax and Nexium  You may use a laxative of your choice, such as Miralax or Glycolax, as recommended by Dr. Henrene Pastor

## 2012-09-15 NOTE — Progress Notes (Signed)
HISTORY OF PRESENT ILLNESS:  Jamie [name] is a 67 y.o. female with hypertension, asthma, morbid obesity, fatty liver disease, IBS, GERD, and adenomatous colon polyps. The patient presents today for routine annual followup. She was last seen in the office 09/23/2011. At that time she was complaining of reflux symptoms on Prevacid twice daily. She has since switched to Nexium 40 mg twice daily. This has controlled her symptoms nicely. She also underwent surveillance colonoscopy in June 2013. She was found to have diminutive polyps and mild left-sided diverticulosis. Followup in 5 years recommended. She continues to take Librax once at night for previously diagnosed IBS. She requests medication refill. She does have a new complaint of constipation. This since her surgery for early stage renal cell carcinoma. Initially she felt this was secondary to pain medications. She does take occasional stool softeners which result and pasty stools. She reports consuming significant amounts of water and fiber. She has not tried other agents. She generally moves her bowels daily, but may go as many as 2 days without a bowel movement. No bleeding. GI review of systems is otherwise negative  REVIEW OF SYSTEMS:  All non-GI ROS negative except for night sweats and urinary leakage  Past Medical History  Diagnosis Date  . Asthma   . GERD (gastroesophageal reflux disease)   . Hypertension   . Obesity   . Allergic rhinitis   . IBS (irritable bowel syndrome)   . Fatty liver disease, nonalcoholic   . Adenomatous colon polyp   . Blood transfusion   . Ulcer   . Dysrhythmia 1999    Jamie [name] 2003  . Anxiety   . Pneumonia 2007  . Headache     migraines with vision changes but no pain  . Heart murmur   . Cancer of kidney   . Sleep apnea     moderate  . Diverticulosis     Past Surgical History  Procedure Laterality Date  . Abdominal hysterectomy  1979  . Tonsillectomy    . Breast reduction surgery  Jan 1991   . Kidney surgery  October 2013     Tumor removed     Social History Jamie [name]  reports that she has never smoked. She has never used smokeless tobacco. She reports that she drinks about 1.8 ounces of alcohol per week. She reports that she does not use illicit drugs.  family history includes Allergies in her brother and daughter; Cancer in her cousin and daughter; Cancer (age of onset: 21) in her father; Heart disease in her brother; Heart disease (age of onset: 74) in her father; Hypertension in her other; and Lung cancer in her father.  There is no history of Asthma.  Allergies  Allergen Reactions  . Aspirin     REACTION: ulcers  . Diflucan (Fluconazole) Hives    Whelps and rash c skin peeling  . Moxifloxacin     Tachycardia   . Latex Rash       PHYSICAL EXAMINATION: Vital signs: BP 134/72  Pulse 60  Ht 5' 3"  (1.6 m)  Wt 215 lb (97.523 kg)  BMI 38.09 kg/m2 General: Well-developed, obese, well-nourished, no acute distress HEENT: Sclerae are anicteric, conjunctiva pink. Oral mucosa intact Lungs: Clear Heart: Regular Abdomen: soft, obese, nontender, nondistended, no obvious ascites, no peritoneal signs, normal bowel sounds. No organomegaly. Extremities: No edema Psychiatric: alert and oriented x3. Cooperative   ASSESSMENT:  #1. Functional constipation #2. GERD. Symptoms controlled with twice a day Nexium #3. History  of IBS. Takes Librax at night #4. History of adenomatous colon polyps. Surveillance up-to-date #5. Fatty liver   PLAN:  #1. Laxative of choice. Recommended to try MiraLax and titrate to need #2. Reflux precautions #3. He Nexium. #4. Refill Librax #5. Surveillance colonoscopy around June 2018 #6. Weight loss and exercise

## 2012-12-01 ENCOUNTER — Other Ambulatory Visit: Payer: Self-pay | Admitting: Orthopaedic Surgery

## 2012-12-01 DIAGNOSIS — M25522 Pain in left elbow: Secondary | ICD-10-CM

## 2012-12-06 ENCOUNTER — Ambulatory Visit
Admission: RE | Admit: 2012-12-06 | Discharge: 2012-12-06 | Disposition: A | Discharge status: Home / Self Care | Payer: Medicare Other | Source: Ambulatory Visit | Attending: Orthopaedic Surgery | Admitting: Orthopaedic Surgery

## 2012-12-06 DIAGNOSIS — S53499A Other sprain of unspecified elbow, initial encounter: Secondary | ICD-10-CM | POA: Diagnosis not present

## 2012-12-06 DIAGNOSIS — M25522 Pain in left elbow: Secondary | ICD-10-CM

## 2012-12-10 DIAGNOSIS — M7710 Lateral epicondylitis, unspecified elbow: Secondary | ICD-10-CM | POA: Diagnosis not present

## 2012-12-15 DIAGNOSIS — R350 Frequency of micturition: Secondary | ICD-10-CM | POA: Diagnosis not present

## 2012-12-15 DIAGNOSIS — N3946 Mixed incontinence: Secondary | ICD-10-CM | POA: Diagnosis not present

## 2012-12-18 ENCOUNTER — Other Ambulatory Visit: Payer: Self-pay | Admitting: Internal Medicine

## 2012-12-22 DIAGNOSIS — N3946 Mixed incontinence: Secondary | ICD-10-CM | POA: Diagnosis not present

## 2012-12-22 DIAGNOSIS — R350 Frequency of micturition: Secondary | ICD-10-CM | POA: Diagnosis not present

## 2012-12-23 ENCOUNTER — Telehealth: Payer: Self-pay | Admitting: Pulmonary Disease

## 2012-12-23 NOTE — Telephone Encounter (Signed)
Spoke with Huey Romans, D/t Medicare guidelines--since pt is new medicare start need an OV note of compliance since start date of insurance. Pts insurance began 12/06/12. There are no OV notes in our system for the new insurance. Pt is going to need a new OV since she has not been seen since 2012 in order to receive supplies.   OV note, orders for supplies and note of compliance needs to be faxed to [place] once completed.   Patient scheduled for appt with [facility]  6/24 @1045 . Pt given address to office in [place] and phone #  Will send to RA as FYI.

## 2012-12-23 NOTE — Telephone Encounter (Signed)
Pt states that her DME Huey Romans called her stating that her Insurance is requiring some information before processing order. Need to call [employer] to find out what all is needed.  Pt is a new Medicare pt and they are requesting a lot of information in order to process order for replacement parts for CPAP machine. (mask/hose/supplies) [facility] [phone]

## 2012-12-24 NOTE — Telephone Encounter (Signed)
ok 

## 2012-12-29 ENCOUNTER — Ambulatory Visit (INDEPENDENT_AMBULATORY_CARE_PROVIDER_SITE_OTHER): Payer: Medicare Other | Admitting: Pulmonary Disease

## 2012-12-29 ENCOUNTER — Encounter: Payer: Self-pay | Admitting: Pulmonary Disease

## 2012-12-29 VITALS — BP 114/72 | HR 65 | Temp 98.3°F | Ht 64.0 in | Wt 215.0 lb

## 2012-12-29 DIAGNOSIS — G4733 Obstructive sleep apnea (adult) (pediatric): Secondary | ICD-10-CM | POA: Diagnosis not present

## 2012-12-29 DIAGNOSIS — G2581 Restless legs syndrome: Secondary | ICD-10-CM | POA: Diagnosis not present

## 2012-12-29 NOTE — Assessment & Plan Note (Signed)
Stable -can consider trial of lyrica but does not tolerate new meds well , hence hold off unless worse

## 2012-12-29 NOTE — Progress Notes (Signed)
  Subjective:    Patient ID: Jamie [name], female    DOB: 10-31-1945, 67 y.o.   MRN: [id number]  HPI 67/F with restrictive lung dz, ? Asthma and allergic rhinitis for FU of moderate OSA  Restless legs , on clonazepam x 10 yrs  CLonazepam, skelaxin & chlordiazepoxide noted on med list  PSg showed AHI of 9/h but quite a few RERAs (resp effort related arousals) with RDI > 30/h, minimal desaturation. Few PLMS, took clonazepam on the night of the study   03/14/2011  Trial of requip 18m - stopped due to nightmares & chest cramps, she is Back on klonopin   Download on cpap 5-12 shows no residual events, excellent compliance >8h, avg pr 12 cm, leak ++  >> changed to 12 cm, trial of nasal mask + chin strap   04/18/2011  Episode last night where patient was dazed with mental confusion and slow slurred speach last night .had a gin martini 1.5 h prior - Of note, similar episodes int he past 4-5 times - note CT head negative in 5/12 for episode of confusion Download 9/3-10/15/12 >> no residual events on CPAP 13 cm, good compliance  12/29/2012 Spoke with Jamie [name]  D/t Medicare guidelines--since pt is new medicare start need an OV note of compliance since start date of insurance. Pts insurance began 12/06/12.  pt last seen 04/18/11. Pt states she wears her CPAP everynight X 8-9 HRS a night. She states some mornings she wake sup and her eyes are swollen and not sure if it is d/t the CPAP and her face at times as well.   Review of Systems neg for any significant sore throat, dysphagia, itching, sneezing, nasal congestion or excess/ purulent secretions, fever, chills, sweats, unintended wt loss, pleuritic or exertional cp, hempoptysis, orthopnea pnd or change in chronic leg swelling. Also denies presyncope, palpitations, heartburn, abdominal pain, nausea, vomiting, diarrhea or change in bowel or urinary habits, dysuria,hematuria, rash, arthralgias, visual complaints, headache, numbness weakness or ataxia.      Objective:   Physical Exam  Gen. Pleasant, well-nourished, in no distress ENT - no lesions, no post nasal drip Neck: No JVD, no thyromegaly, no carotid bruits Lungs: no use of accessory muscles, no dullness to percussion, clear without rales or rhonchi  Cardiovascular: Rhythm regular, heart sounds  normal, no murmurs or gallops, no peripheral edema Musculoskeletal: No deformities, no cyanosis or clubbing         Assessment & Plan:

## 2012-12-29 NOTE — Patient Instructions (Signed)
Supplies will be renewed Take your CPAP day of surgery

## 2012-12-29 NOTE — Assessment & Plan Note (Signed)
Medicare CMN will be filled out - stable on CPAP 13 cm Supplies renewed Take CPAP with her during surgery - careful with pain meds post op  Weight loss encouraged, compliance with goal of at least 4-6 hrs every night is the expectation. Advised against medications with sedative side effects Cautioned against driving when sleepy - understanding that sleepiness will vary on a day to day basis

## 2012-12-31 DIAGNOSIS — M7710 Lateral epicondylitis, unspecified elbow: Secondary | ICD-10-CM | POA: Diagnosis not present

## 2013-01-01 DIAGNOSIS — M7710 Lateral epicondylitis, unspecified elbow: Secondary | ICD-10-CM | POA: Diagnosis not present

## 2013-01-11 ENCOUNTER — Other Ambulatory Visit: Payer: Self-pay | Admitting: Internal Medicine

## 2013-03-10 DIAGNOSIS — G2581 Restless legs syndrome: Secondary | ICD-10-CM | POA: Diagnosis not present

## 2013-03-10 DIAGNOSIS — M79609 Pain in unspecified limb: Secondary | ICD-10-CM | POA: Diagnosis not present

## 2013-03-23 DIAGNOSIS — M76899 Other specified enthesopathies of unspecified lower limb, excluding foot: Secondary | ICD-10-CM | POA: Diagnosis not present

## 2013-04-01 ENCOUNTER — Other Ambulatory Visit: Payer: Self-pay | Admitting: Internal Medicine

## 2013-04-01 NOTE — Telephone Encounter (Signed)
Refill done.  

## 2013-04-04 ENCOUNTER — Emergency Department (HOSPITAL_COMMUNITY)
Admission: EM | Admit: 2013-04-04 | Discharge: 2013-04-04 | Disposition: A | Discharge status: Home / Self Care | Payer: Medicare Other | Attending: Emergency Medicine | Admitting: Emergency Medicine

## 2013-04-04 ENCOUNTER — Emergency Department (HOSPITAL_COMMUNITY): Payer: Medicare Other

## 2013-04-04 ENCOUNTER — Encounter (HOSPITAL_COMMUNITY): Payer: Self-pay | Admitting: *Deleted

## 2013-04-04 DIAGNOSIS — Z85528 Personal history of other malignant neoplasm of kidney: Secondary | ICD-10-CM | POA: Insufficient documentation

## 2013-04-04 DIAGNOSIS — R51 Headache: Secondary | ICD-10-CM | POA: Insufficient documentation

## 2013-04-04 DIAGNOSIS — R110 Nausea: Secondary | ICD-10-CM | POA: Diagnosis not present

## 2013-04-04 DIAGNOSIS — I10 Essential (primary) hypertension: Secondary | ICD-10-CM | POA: Diagnosis not present

## 2013-04-04 DIAGNOSIS — R011 Cardiac murmur, unspecified: Secondary | ICD-10-CM | POA: Insufficient documentation

## 2013-04-04 DIAGNOSIS — IMO0002 Reserved for concepts with insufficient information to code with codable children: Secondary | ICD-10-CM | POA: Diagnosis not present

## 2013-04-04 DIAGNOSIS — J45909 Unspecified asthma, uncomplicated: Secondary | ICD-10-CM | POA: Diagnosis not present

## 2013-04-04 DIAGNOSIS — F411 Generalized anxiety disorder: Secondary | ICD-10-CM | POA: Insufficient documentation

## 2013-04-04 DIAGNOSIS — Z79899 Other long term (current) drug therapy: Secondary | ICD-10-CM | POA: Diagnosis not present

## 2013-04-04 DIAGNOSIS — Z8701 Personal history of pneumonia (recurrent): Secondary | ICD-10-CM | POA: Diagnosis not present

## 2013-04-04 DIAGNOSIS — Z91040 Latex allergy status: Secondary | ICD-10-CM | POA: Diagnosis not present

## 2013-04-04 DIAGNOSIS — Z860100 Personal history of colon polyps, unspecified: Secondary | ICD-10-CM | POA: Insufficient documentation

## 2013-04-04 DIAGNOSIS — H538 Other visual disturbances: Secondary | ICD-10-CM | POA: Diagnosis not present

## 2013-04-04 DIAGNOSIS — K219 Gastro-esophageal reflux disease without esophagitis: Secondary | ICD-10-CM | POA: Insufficient documentation

## 2013-04-04 DIAGNOSIS — Z872 Personal history of diseases of the skin and subcutaneous tissue: Secondary | ICD-10-CM | POA: Diagnosis not present

## 2013-04-04 DIAGNOSIS — E669 Obesity, unspecified: Secondary | ICD-10-CM | POA: Diagnosis not present

## 2013-04-04 LAB — CBC
HCT: 38.1 % (ref 36.0–46.0)
MCHC: 34.4 g/dL (ref 30.0–36.0)
MCV: 91.4 fL (ref 78.0–100.0)
Platelets: 235 10*3/uL (ref 150–400)
RDW: 13 % (ref 11.5–15.5)
WBC: 7.6 10*3/uL (ref 4.0–10.5)

## 2013-04-04 LAB — POCT I-STAT, CHEM 8
Calcium, Ion: 1.2 mmol/L (ref 1.13–1.30)
Chloride: 107 mEq/L (ref 96–112)
HCT: 38 % (ref 36.0–46.0)
Hemoglobin: 12.9 g/dL (ref 12.0–15.0)
TCO2: 22 mmol/L (ref 0–100)

## 2013-04-04 MED ORDER — METOCLOPRAMIDE HCL 5 MG/ML IJ SOLN
10.0000 mg | Freq: Once | INTRAMUSCULAR | Status: AC
  Administered 2013-04-04: 10 mg via INTRAVENOUS
  Filled 2013-04-04: qty 2

## 2013-04-04 MED ORDER — SODIUM CHLORIDE 0.9 % IV BOLUS (SEPSIS)
1000.0000 mL | Freq: Once | INTRAVENOUS | Status: AC
  Administered 2013-04-04: 1000 mL via INTRAVENOUS

## 2013-04-04 MED ORDER — METOCLOPRAMIDE HCL 10 MG PO TABS: 10.0000 mg | ORAL_TABLET | Freq: Four times a day (QID) | ORAL | Status: DC

## 2013-04-04 NOTE — ED Notes (Signed)
Patient is alert and oriented x3.  She is complaining of a headache that is localized behind the right eye.   She states that she had a headache all day yesterday.  Currently she rates her pain 2 of 10 without nausea.  Patient is able to ambulate with no problem

## 2013-04-04 NOTE — ED Provider Notes (Signed)
CSN: [id number]     Arrival date & time 04/04/13  0053 History   First MD Initiated Contact with Patient 04/04/13 0133     Chief Complaint  Patient presents with  . Migraine  . Nausea   (Consider location/radiation/quality/duration/timing/severity/associated sxs/prior Treatment) HPI Provided by patient. Woke up with right-sided headache this morning, throbbing in quality and moderate to severe. Patient unable to say if onset gradual versus sudden. She had some blurry vision in her right eye but that has now resolved. She had associated nausea but no vomiting. She took some aspirin and Motrin with intermittent relief. Headache has been on and off all day. Worse with movement and walking. No known alleviating factors otherwise. She has a history of migraine headaches, states she has not had a migraine since 1984 and this headache is different. No syncope. No weakness or numbness. No neck stiffness. No fevers or chills. No recent travel. No known sick contacts. No rash.    Past Medical History  Diagnosis Date  . Asthma   . GERD (gastroesophageal reflux disease)   . Hypertension   . Obesity   . Allergic rhinitis   . IBS (irritable bowel syndrome)   . Fatty liver disease, nonalcoholic   . Adenomatous colon polyp   . Blood transfusion   . Ulcer   . Dysrhythmia 1999    Dr Johnsie Cancel 2003  . Anxiety   . Pneumonia 2007  . Headache(784.0)     migraines with vision changes but no pain  . Heart murmur   . Cancer of kidney   . Sleep apnea     moderate  . Diverticulosis    Past Surgical History  Procedure Laterality Date  . Abdominal hysterectomy  1979  . Tonsillectomy    . Breast reduction surgery  Jan 1991  . Kidney surgery  October 2013     Tumor removed    Family History  Problem Relation Age of Onset  . Heart disease Father 20  . Lung cancer Father   . Cancer Father 59    lung ca  . Heart disease Brother   . Asthma Neg Hx   . Hypertension Other   . Cancer Cousin     colon  .  Allergies Daughter   . Allergies Brother   . Cancer Daughter     malignant melanoma   History  Substance Use Topics  . Smoking status: Never Smoker   . Smokeless tobacco: Never Used  . Alcohol Use: 1.8 oz/week    3 Glasses of wine per week     Comment: daily>>>counselled to reduce ETOH use    OB History   Grav Para Term Preterm Abortions TAB SAB Ect Mult Living                 Review of Systems  Constitutional: Negative for fever and chills.  HENT: Negative for neck pain and neck stiffness.   Eyes: Negative for pain.  Respiratory: Negative for shortness of breath.   Cardiovascular: Negative for chest pain.  Gastrointestinal: Positive for nausea. Negative for vomiting and abdominal pain.  Genitourinary: Negative for dysuria.  Musculoskeletal: Negative for back pain.  Skin: Negative for rash.  Neurological: Positive for headaches.  All other systems reviewed and are negative.    Allergies  Aspirin; Diflucan; Latex; and Moxifloxacin  Home Medications   Current Outpatient Rx  Name  Route  Sig  Dispense  Refill  . clidinium-chlordiazePOXIDE (LIBRAX) 2.5-5 MG per capsule   Oral  Take 1 capsule by mouth every evening.          . clonazePAM (KLONOPIN) 1 MG tablet   Oral   Take 1 mg by mouth at bedtime as needed (sleep).         . diphenhydrAMINE (BENADRYL) 25 MG tablet   Oral   Take 25 mg by mouth every 8 (eight) hours as needed for allergies.          Marland Kitchen esomeprazole (NEXIUM) 40 MG capsule   Oral   Take 1 capsule (40 mg total) by mouth 2 (two) times daily.   180 capsule   3   . fluticasone (FLONASE) 50 MCG/ACT nasal spray   Nasal   Place 2 sprays into the nose daily as needed for rhinitis or allergies.         Marland Kitchen ibuprofen (ADVIL,MOTRIN) 800 MG tablet   Oral   Take 800 mg by mouth every 8 (eight) hours as needed for pain (headache).          . metaxalone (SKELAXIN) 800 MG tablet   Oral   Take 800 mg by mouth at bedtime.         . metoprolol  succinate (TOPROL-XL) 100 MG 24 hr tablet   Oral   Take 100 mg by mouth every evening. Take with or immediately following a meal.         . Multiple Vitamin (MULTIVITAMIN WITH MINERALS) TABS   Oral   Take 1 tablet by mouth every evening.          . olmesartan (BENICAR) 40 MG tablet   Oral   Take 40 mg by mouth every evening.         Marland Kitchen spironolactone (ALDACTONE) 50 MG tablet   Oral   Take 50 mg by mouth every morning.          BP 139/74  Pulse 80  Temp(Src) 99 F (37.2 C) (Oral)  Resp 18  SpO2 100% Physical Exam  Constitutional: She is oriented to person, place, and time. She appears well-developed and well-nourished.  HENT:  Head: Normocephalic and atraumatic.  Eyes: EOM are normal. Pupils are equal, round, and reactive to light.  Neck: Neck supple.  Cardiovascular: Normal rate, regular rhythm and intact distal pulses.   Pulmonary/Chest: Effort normal and breath sounds normal. No respiratory distress.  Abdominal: Soft. There is no tenderness.  Musculoskeletal: Normal range of motion. She exhibits no edema.  Neurological: She is alert and oriented to person, place, and time. She displays normal reflexes. No cranial nerve deficit. She exhibits normal muscle tone. Coordination normal.  Speech clear, no pronator drift, no facial droop, no unilateral deficits. Normal gait.  Skin: Skin is warm and dry.    ED Course  Procedures (including critical care time) Labs Review Labs Reviewed  CBC  POCT I-STAT, CHEM 8   Imaging Review Ct Head Wo Contrast  04/04/2013   *RADIOLOGY REPORT*  Clinical Data: Headache, nausea.  CT HEAD WITHOUT CONTRAST  Technique:  Contiguous axial images were obtained from the base of the skull through the vertex without contrast.  Comparison: CT head Nov 30, 2010.  Findings: The ventricles and sulci are normal.  No intraparenchymal hemorrhage, mass effect nor midline shift.  No acute large vascular territory infarcts. Focal sub centimeter  hypodensity left frontal lobe appears new, image 18/28.  No abnormal extra-axial fluid collections.  Basal cisterns are patent.  No skull fracture.  Visualized paranasal sinuses and mastoid aircells are well-aerated.  The included ocular globes and orbital contents are non-suspicious. Moderate right temporomandibular osteoarthrosis.  IMPRESSION: Focal sub centimeter white matter hypodensity left frontal lobe may reflect sulcal partial voluming versus chronic small vessel ischemic disease without definite acute intracranial process.   Original Report Authenticated By: Elon Alas   IVFs, IV reglan.   3:05 AM CT results d/w DR Bloomer RAD, she recs outpatient MRI follow up versus need for emergent MRI tonight. On recheck, headache improved and nausea resolved. Repeat exam unchanged no neuro deficits.  She agrees with plan discharge home and followup with primary care physician to discuss CT scan results and consider outpatient MRI to further evaluate.  All questions answered. Patient requesting prescription for Reglan which was provided as needed.   MDM  Diagnosis: Headache CT scan, labs Improved with IV fluids and medications Vital signs and nursing notes reviewed   Teressa Lower, MD 04/04/13 [phone]

## 2013-04-19 DIAGNOSIS — R930 Abnormal findings on diagnostic imaging of skull and head, not elsewhere classified: Secondary | ICD-10-CM | POA: Diagnosis not present

## 2013-04-19 DIAGNOSIS — R51 Headache: Secondary | ICD-10-CM | POA: Diagnosis not present

## 2013-04-20 ENCOUNTER — Other Ambulatory Visit: Payer: Self-pay | Admitting: Internal Medicine

## 2013-04-22 DIAGNOSIS — R350 Frequency of micturition: Secondary | ICD-10-CM | POA: Diagnosis not present

## 2013-04-22 DIAGNOSIS — N3946 Mixed incontinence: Secondary | ICD-10-CM | POA: Diagnosis not present

## 2013-04-22 DIAGNOSIS — C649 Malignant neoplasm of unspecified kidney, except renal pelvis: Secondary | ICD-10-CM | POA: Diagnosis not present

## 2013-04-23 DIAGNOSIS — Z23 Encounter for immunization: Secondary | ICD-10-CM | POA: Diagnosis not present

## 2013-04-27 ENCOUNTER — Ambulatory Visit
Admission: RE | Admit: 2013-04-27 | Discharge: 2013-04-27 | Disposition: A | Discharge status: Home / Self Care | Payer: Medicare Other | Source: Ambulatory Visit | Attending: Internal Medicine | Admitting: Internal Medicine

## 2013-04-27 DIAGNOSIS — R51 Headache: Secondary | ICD-10-CM | POA: Diagnosis not present

## 2013-04-27 MED ORDER — GADOBENATE DIMEGLUMINE 529 MG/ML IV SOLN
20.0000 mL | Freq: Once | INTRAVENOUS | Status: AC | PRN
  Administered 2013-04-27: 20 mL via INTRAVENOUS

## 2013-04-30 ENCOUNTER — Encounter: Payer: Self-pay | Admitting: Neurology

## 2013-04-30 DIAGNOSIS — N3946 Mixed incontinence: Secondary | ICD-10-CM | POA: Diagnosis not present

## 2013-04-30 DIAGNOSIS — R350 Frequency of micturition: Secondary | ICD-10-CM | POA: Diagnosis not present

## 2013-05-03 ENCOUNTER — Ambulatory Visit (INDEPENDENT_AMBULATORY_CARE_PROVIDER_SITE_OTHER): Payer: Medicare Other | Admitting: Neurology

## 2013-05-03 ENCOUNTER — Encounter: Payer: Self-pay | Admitting: Neurology

## 2013-05-03 VITALS — BP 112/67 | HR 63 | Ht 62.0 in | Wt 221.0 lb

## 2013-05-03 DIAGNOSIS — R519 Headache, unspecified: Secondary | ICD-10-CM | POA: Insufficient documentation

## 2013-05-03 DIAGNOSIS — R51 Headache: Secondary | ICD-10-CM | POA: Diagnosis not present

## 2013-05-03 LAB — SEDIMENTATION RATE: Sed Rate: 29 mm/hr (ref 0–40)

## 2013-05-03 NOTE — Progress Notes (Signed)
[facility]    Provider:  Dr Janann Colonel Referring Provider: Horatio Pel,* Primary Care Physician:  Horatio Pel, MD  CC:  headache  HPI:  Jamie [name] is a 67 y.o. female here as a referral from Dr. Shelia Media for headache and visual changes  Had migraines when she was younger, also had migraines triggered by OCP in the past. Since then would have very infrequent migraines through the 70s. In the 80s started having more migraines. Tried self hypnosis and has been migraine free since 1984. Has had episodes of aura but no headache since then, described as blurry vision, especially difficulty with central vision. Happens 2-3 times a year.   On AM of 9/28, woke up with severe "explosive" pain on R side of head. Was retro-orbital radiating backwards. Tried ASA with no benefit, then tried motrin 853m. Was not similar to her typical migraine. During the event her right eye vision was blurry. Vision improved as the headache went away. Headache eased up with the ibuprofen but did not completely go away. Just severe constant pain. Felt nauseous, some photosensitivity.   Last eye exam was one year ago, reports everything normal at that time.   MRI brain imaging reviewed and was unremarkable.   Review of Systems: Out of a complete 14 system review, the patient complains of only the following symptoms, and all other reviewed systems are negative. Other for feeling hot feeling cold flushing incontinence allergies skin sensitivity  History   Social History  . Marital Status: Married    Spouse Name: JSallyanne [name]   Number of Children: 2  . Years of Education: 15   Occupational History  . Retired OEconomist .     Social History Main Topics  . Smoking status: Never Smoker   . Smokeless tobacco: Never Used  . Alcohol Use: 1.8 oz/week    3 Glasses of wine per week     Comment: daily>>>counselled to reduce ETOH use   . Drug Use: No  . Sexual  Activity: Yes    Birth Control/ Protection: Surgical   Other Topics Concern  . Not on file   Social History Narrative   Exercise 3-4 times a week.   Married (Jamie [name] and lives with her spouse.   Retired RPersonal assistant   Education- college   Patient is right handed.   Patient drinks tea occasionally .    Family History  Problem Relation Age of Onset  . Heart disease Father 443 . Lung cancer Father   . Cancer Father 683   lung ca  . Heart disease Brother   . Asthma Neg Hx   . Hypertension Other   . Cancer Cousin     colon  . Allergies Daughter   . Allergies Brother   . Cancer Daughter     malignant melanoma    Past Medical History  Diagnosis Date  . Asthma   . GERD (gastroesophageal reflux disease)   . Hypertension   . Obesity   . Allergic rhinitis   . IBS (irritable bowel syndrome)   . Fatty liver disease, nonalcoholic   . Adenomatous colon polyp   . Blood transfusion   . Ulcer   . Dysrhythmia 1999    Dr NJohnsie Cancel2003  . Anxiety   . Pneumonia 2007  . Headache(784.0)     migraines with vision changes but no pain  . Heart murmur   . Cancer of kidney   .  Sleep apnea     moderate  . Diverticulosis     Past Surgical History  Procedure Laterality Date  . Abdominal hysterectomy  1979  . Tonsillectomy    . Breast reduction surgery  Jan 1991  . Kidney surgery  October 2013     Tumor removed     Current Outpatient Prescriptions  Medication Sig Dispense Refill  . clidinium-chlordiazePOXIDE (LIBRAX) 2.5-5 MG per capsule Take 1 capsule by mouth every evening.       . clonazePAM (KLONOPIN) 1 MG tablet Take 1 mg by mouth at bedtime as needed (sleep).      . diphenhydrAMINE (BENADRYL) 25 MG tablet Take 25 mg by mouth every 8 (eight) hours as needed for allergies.       Marland Kitchen esomeprazole (NEXIUM) 40 MG capsule Take 1 capsule (40 mg total) by mouth 2 (two) times daily.  180 capsule  3  . fluticasone (FLONASE) 50 MCG/ACT nasal spray Place 2 sprays into the nose daily  as needed for rhinitis or allergies.      Marland Kitchen ibuprofen (ADVIL,MOTRIN) 800 MG tablet Take 800 mg by mouth every 8 (eight) hours as needed for pain (headache).       . metaxalone (SKELAXIN) 800 MG tablet Take 800 mg by mouth at bedtime.      . metoprolol succinate (TOPROL-XL) 100 MG 24 hr tablet Take 100 mg by mouth every evening. Take with or immediately following a meal.      . Multiple Vitamin (MULTIVITAMIN WITH MINERALS) TABS Take 1 tablet by mouth every evening.       . olmesartan (BENICAR) 40 MG tablet Take 40 mg by mouth every evening.      Marland Kitchen spironolactone (ALDACTONE) 50 MG tablet Take 50 mg by mouth every morning.       Current Facility-Administered Medications  Medication Dose Route Frequency Provider Last Rate Last Dose  . methylPREDNISolone acetate (DEPO-MEDROL) injection 80 mg  80 mg Intra-articular Once Cassandria Anger, MD        Allergies as of 05/03/2013 - Review Complete 05/03/2013  Allergen Reaction Noted  . Aspirin Other (See Comments) 01/01/2008  . Diflucan [fluconazole] Hives 04/13/2012  . Latex Rash 04/01/2012  . Moxifloxacin Palpitations     Vitals: BP 112/67  Pulse 63  Ht 5' 2"  (1.575 m)  Wt 221 lb (100.245 kg)  BMI 40.41 kg/m2 Last Weight:  Wt Readings from Last 1 Encounters:  05/03/13 221 lb (100.245 kg)   Last Height:   Ht Readings from Last 1 Encounters:  05/03/13 5' 2"  (1.575 m)     Physical exam: Exam: Gen: NAD, conversant Eyes: anicteric sclerae, moist conjunctivae HENT: Atraumatic, oropharynx clear Neck: Trachea midline; supple,  Lungs: CTA, no wheezing, rales, rhonic                          CV: RRR, no MRG Abdomen: Soft, non-tender;  Extremities: No peripheral edema  Skin: Normal temperature, no rash,  Psych: Appropriate affect, pleasant  Neuro: MS: AA&Ox3, appropriately interactive, normal affect   Speech: fluent w/o paraphasic error  Memory: good recent and remote recall  CN: PERRL, funduscopic exam within normal limits  bilaterally, visual fields full to finger count bilaterally, EOMI no nystagmus, no ptosis, sensation intact to LT V1-V3 bilat, face symmetric, no weakness, hearing grossly intact, palate elevates symmetrically, shoulder shrug 5/5 bilat,  tongue protrudes midline, no fasiculations noted.  Motor: normal bulk and tone Strength: 5/5  In  all extremities  Coord: rapid alternating and point-to-point (FNF, HTS) movements intact.  Reflexes: symmetrical, bilat downgoing toes  Sens: LT intact in all extremities  Gait: posture, stance, stride and arm-swing normal. Tandem gait intact. Able to walk on heels and toes. Romberg absent.   Assessment:  After physical and neurologic examination, review of laboratory studies, imaging, neurophysiology testing and pre-existing records, assessment will be reviewed on the problem list.  Plan:  Treatment plan and additional workup will be reviewed under Problem List.  1)Headache  67y/o woman presenting for initial evaluation of headache and visual changes. Physical exam unremarkable. MRI imaging also unremarkable. Based on clinical history suspect this most likely represents a migraine headache. Counseled patient that migraines can have variable presentation. Based on age and visual changes will r/o temporal arteritis though low suspicion based on history and lack of supporting features. Instructed patient to follow up with eye clinic for yearly checkup. Will check ESR today. Follow up as needed.

## 2013-05-03 NOTE — Patient Instructions (Signed)
Overall you are doing fairly well but I do want to suggest a few things today:   I would like to check some blood work today to rule out a possible cause of your headache.  We can follow up with you as needed. Please call us with any interim questions, concerns, problems, updates or refill requests.   Please also call us for any test results so we can go over those with you on the phone.  My clinical assistant and will answer any of your questions and relay your messages to me and also relay most of my messages to you.   Our phone number is [phone]. We also have an after hours call service for urgent matters and there is a physician on-call for urgent questions. For any emergencies you know to call 911 or go to the nearest emergency room

## 2013-05-04 NOTE — Progress Notes (Signed)
Quick Note:  Shared normal lab findings with patient per VM message ______

## 2013-05-13 DIAGNOSIS — I10 Essential (primary) hypertension: Secondary | ICD-10-CM | POA: Diagnosis not present

## 2013-05-13 DIAGNOSIS — Z0000 Encounter for general adult medical examination without abnormal findings: Secondary | ICD-10-CM | POA: Diagnosis not present

## 2013-05-19 DIAGNOSIS — I10 Essential (primary) hypertension: Secondary | ICD-10-CM | POA: Diagnosis not present

## 2013-05-19 DIAGNOSIS — K7689 Other specified diseases of liver: Secondary | ICD-10-CM | POA: Diagnosis not present

## 2013-05-19 DIAGNOSIS — Z0000 Encounter for general adult medical examination without abnormal findings: Secondary | ICD-10-CM | POA: Diagnosis not present

## 2013-05-21 DIAGNOSIS — D239 Other benign neoplasm of skin, unspecified: Secondary | ICD-10-CM | POA: Diagnosis not present

## 2013-05-21 DIAGNOSIS — D1801 Hemangioma of skin and subcutaneous tissue: Secondary | ICD-10-CM | POA: Diagnosis not present

## 2013-05-21 DIAGNOSIS — L723 Sebaceous cyst: Secondary | ICD-10-CM | POA: Diagnosis not present

## 2013-05-21 DIAGNOSIS — L719 Rosacea, unspecified: Secondary | ICD-10-CM | POA: Diagnosis not present

## 2013-05-21 DIAGNOSIS — L821 Other seborrheic keratosis: Secondary | ICD-10-CM | POA: Diagnosis not present

## 2013-05-21 DIAGNOSIS — L570 Actinic keratosis: Secondary | ICD-10-CM | POA: Diagnosis not present

## 2013-05-31 DIAGNOSIS — N3946 Mixed incontinence: Secondary | ICD-10-CM | POA: Diagnosis not present

## 2013-05-31 DIAGNOSIS — R350 Frequency of micturition: Secondary | ICD-10-CM | POA: Diagnosis not present

## 2013-06-12 ENCOUNTER — Other Ambulatory Visit: Payer: Self-pay | Admitting: Internal Medicine

## 2013-06-23 ENCOUNTER — Ambulatory Visit (INDEPENDENT_AMBULATORY_CARE_PROVIDER_SITE_OTHER): Payer: Medicare Other | Admitting: Internal Medicine

## 2013-06-23 ENCOUNTER — Encounter: Payer: Self-pay | Admitting: Internal Medicine

## 2013-06-23 VITALS — BP 112/68 | HR 66 | Ht 63.0 in | Wt 221.0 lb

## 2013-06-23 DIAGNOSIS — E669 Obesity, unspecified: Secondary | ICD-10-CM

## 2013-06-23 DIAGNOSIS — K7689 Other specified diseases of liver: Secondary | ICD-10-CM

## 2013-06-23 DIAGNOSIS — R7402 Elevation of levels of lactic acid dehydrogenase (LDH): Secondary | ICD-10-CM

## 2013-06-23 DIAGNOSIS — K760 Fatty (change of) liver, not elsewhere classified: Secondary | ICD-10-CM

## 2013-06-23 DIAGNOSIS — K5900 Constipation, unspecified: Secondary | ICD-10-CM

## 2013-06-23 DIAGNOSIS — K219 Gastro-esophageal reflux disease without esophagitis: Secondary | ICD-10-CM

## 2013-06-23 NOTE — Progress Notes (Signed)
HISTORY OF PRESENT ILLNESS:  Jamie [name] is a 67 y.o. female with multiple medical problems as listed below. She is sent here today by her primary provider regarding elevated hepatic transaminases. She has had this problem for some time. She is known to have fatty liver disease. Review of outside blood work from November 2014 reveals mild elevation of hepatic transaminases with an AST of 78 and ALT of 107 (upper limit of normal 78). Normal alkaline phosphatase, total bilirubin, albumin, and protein. Normal CBC, MCV, and platelets. She has a history of renal cell carcinoma, right for which she is status post resection. MRI from August 2013 revealed severe hepatic steatosis without other liver abnormalities. The patient consumes several alcoholic beverages several days per week. She was last seen here in March of 2014. She has gained 6 pounds since that visit. Her BMI is 38.1. No new medications. Her other complaint is chronic constipation. MiraLax previously recommended. She is apprehensive about using this regulate, though it works. Her last complete colonoscopy was performed June 2013. 3 diminutive polyps (hyperplastic and adenomatous) as well as mild diverticulosis.  REVIEW OF SYSTEMS:  All non-GI ROS negative except for joint aches, sinus and allergy, anxiety  Past Medical History  Diagnosis Date  . Asthma   . GERD (gastroesophageal reflux disease)   . Hypertension   . Obesity   . Allergic rhinitis   . IBS (irritable bowel syndrome)   . Fatty liver disease, nonalcoholic   . Adenomatous colon polyp   . Blood transfusion   . Ulcer   . Dysrhythmia 1999    Dr Johnsie Cancel 2003  . Anxiety   . Pneumonia 2007  . Headache(784.0)     migraines with vision changes but no pain  . Heart murmur   . Cancer of kidney   . Sleep apnea     moderate  . Diverticulosis   . Elevated LFTs     Past Surgical History  Procedure Laterality Date  . Abdominal hysterectomy  1979  . Tonsillectomy    .  Breast reduction surgery  Jan 1991  . Kidney surgery Right October 2013     Tumor removed   . Elbow surgery Left     Social History Jamie [name]  reports that she has never smoked. She has never used smokeless tobacco. She reports that she drinks about 1.8 ounces of alcohol per week. She reports that she does not use illicit drugs.  family history includes Allergies in her brother and daughter; Colon cancer in her cousin; Heart disease in her brother; Heart disease (age of onset: 55) in her father; Hypertension in her other; Lung cancer in her father; Melanoma in her daughter. There is no history of Liver disease or Asthma.  Allergies  Allergen Reactions  . Aspirin Other (See Comments)    REACTION: ulcers  . Diflucan [Fluconazole] Hives    Whelps and rash c skin peeling  . Latex Rash  . Moxifloxacin Palpitations    Tachycardia        PHYSICAL EXAMINATION: Vital signs: BP 112/68  Pulse 66  Ht 5' 3"  (1.6 m)  Wt 221 lb (100.245 kg)  BMI 39.16 kg/m2  Constitutional: Obese, generally well-appearing, no acute distress Psychiatric: alert and oriented x3, cooperative Eyes: extraocular movements intact, anicteric, conjunctiva pink Mouth: oral pharynx moist, no lesions Neck: supple no lymphadenopathy Cardiovascular: heart regular rate and rhythm, no murmur Lungs: clear to auscultation bilaterally Abdomen: soft, obese, nontender, nondistended, no obvious ascites, no peritoneal signs,  normal bowel sounds, no organomegaly Extremities: no lower extremity edema bilaterally Skin: no lesions on visible extremities Neuro: No focal deficits. No asterixis.    ASSESSMENT:  #1. Fatty liver. This is almost certainly the cause for mild transaminase elevation. No evidence for hepatic synthetic dysfunction or advanced liver disease. However, long discussion today regarding fatty liver disease, its causes, and potential outcomes such as cirrhosis as it relates to NASH. #2. Functional  constipation. Ongoing #3. GERD. Controlled with Nexium #4. Multiple medical problems   PLAN:  #1. Long discussion on fatty liver disease. Supplemental literature provided for her review #2. Sustained exercise and weight loss. Also recommended consultation with nutritional expert as well as exercise trainer #3. MiraLax for constipation. Okay to use regularly. Titrated to need #4. Reflux precautions and continue Nexium for control of GERD #5. Surveillance colonoscopy around June 2018. #6. Resume general medical care with PCP

## 2013-06-23 NOTE — Patient Instructions (Signed)
You have been given some information on fatty liver.  Please follow up with Dr. Henrene Pastor as needed

## 2013-07-23 ENCOUNTER — Other Ambulatory Visit: Payer: Self-pay | Admitting: Internal Medicine

## 2013-07-26 DIAGNOSIS — R42 Dizziness and giddiness: Secondary | ICD-10-CM | POA: Diagnosis not present

## 2013-07-26 DIAGNOSIS — G43909 Migraine, unspecified, not intractable, without status migrainosus: Secondary | ICD-10-CM | POA: Diagnosis not present

## 2013-08-10 ENCOUNTER — Other Ambulatory Visit: Payer: Self-pay | Admitting: Internal Medicine

## 2013-08-14 ENCOUNTER — Other Ambulatory Visit: Payer: Self-pay | Admitting: Internal Medicine

## 2013-08-18 ENCOUNTER — Telehealth: Payer: Self-pay

## 2013-08-18 NOTE — Telephone Encounter (Signed)
Faxed clarification that pt takes Nexium twice a day

## 2013-08-27 DIAGNOSIS — H8110 Benign paroxysmal vertigo, unspecified ear: Secondary | ICD-10-CM | POA: Diagnosis not present

## 2013-08-27 DIAGNOSIS — R42 Dizziness and giddiness: Secondary | ICD-10-CM | POA: Diagnosis not present

## 2013-09-20 ENCOUNTER — Encounter: Payer: Self-pay | Admitting: Internal Medicine

## 2013-09-20 ENCOUNTER — Ambulatory Visit (INDEPENDENT_AMBULATORY_CARE_PROVIDER_SITE_OTHER): Payer: Medicare Other | Admitting: Internal Medicine

## 2013-09-20 VITALS — BP 124/82 | HR 75 | Ht 62.0 in | Wt 222.2 lb

## 2013-09-20 DIAGNOSIS — K219 Gastro-esophageal reflux disease without esophagitis: Secondary | ICD-10-CM | POA: Diagnosis not present

## 2013-09-20 DIAGNOSIS — K7689 Other specified diseases of liver: Secondary | ICD-10-CM | POA: Diagnosis not present

## 2013-09-20 DIAGNOSIS — Z86010 Personal history of colonic polyps: Secondary | ICD-10-CM

## 2013-09-20 DIAGNOSIS — K589 Irritable bowel syndrome without diarrhea: Secondary | ICD-10-CM

## 2013-09-20 DIAGNOSIS — K760 Fatty (change of) liver, not elsewhere classified: Secondary | ICD-10-CM

## 2013-09-20 MED ORDER — ESOMEPRAZOLE MAGNESIUM 40 MG PO CPDR: 40.0000 mg | DELAYED_RELEASE_CAPSULE | Freq: Two times a day (BID) | ORAL | Status: DC

## 2013-09-20 MED ORDER — CILIDINIUM-CHLORDIAZEPOXIDE 2.5-5 MG PO CAPS: 1.0000 | ORAL_CAPSULE | Freq: Two times a day (BID) | ORAL | Status: DC

## 2013-09-20 NOTE — Patient Instructions (Signed)
We have sent the following medications to your pharmacy for you to pick up at your convenience:  Librax, Nexium

## 2013-09-20 NOTE — Progress Notes (Signed)
HISTORY OF PRESENT ILLNESS:  Jamie [name] is a 68 y.o. female with the below listed medical history who is followed in this office for fatty liver, GERD, IBS, and adenomatous colon polyps. Last seen 06/23/2013 regarding mild elevation of hepatic transaminases. She was felt most likely to have fatty liver. This was discussed. Exercise and weight loss recommended. She presents today for routine followup. For GERD she continues on Nexium with good control of symptoms. No dysphagia. She uses occasional Librax for her IBS. This helps. Her last colonoscopy was performed June 2013. Diminutive polyps removed. Followup in 5 years recommended. Currently with no active GI complaints. She does request medication refill  REVIEW OF SYSTEMS:  All non-GI ROS negative except for sinus and allergy trouble, heart murmur, sore throat, urinary leakage Past Medical History  Diagnosis Date  . Asthma   . GERD (gastroesophageal reflux disease)   . Hypertension   . Obesity   . Allergic rhinitis   . IBS (irritable bowel syndrome)   . Fatty liver disease, nonalcoholic   . Adenomatous colon polyp   . Blood transfusion   . Ulcer   . Dysrhythmia 1999    Dr Johnsie Cancel 2003  . Anxiety   . Pneumonia 2007  . Headache(784.0)     migraines with vision changes but no pain  . Heart murmur   . Cancer of kidney   . Sleep apnea     moderate  . Diverticulosis   . Elevated LFTs     Past Surgical History  Procedure Laterality Date  . Abdominal hysterectomy  1979  . Tonsillectomy    . Breast reduction surgery  Jan 1991  . Kidney surgery Right October 2013     Tumor removed   . Elbow surgery Left     Social History Jamie [name]  reports that she has never smoked. She has never used smokeless tobacco. She reports that she drinks about 1.8 ounces of alcohol per week. She reports that she does not use illicit drugs.  family history includes Allergies in her brother and daughter; Colon cancer in her cousin; Heart  disease in her brother; Heart disease (age of onset: 59) in her father; Hypertension in her other; Lung cancer in her father; Melanoma in her daughter. There is no history of Liver disease or Asthma.  Allergies  Allergen Reactions  . Aspirin Other (See Comments)    REACTION: ulcers  . Diflucan [Fluconazole] Hives    Whelps and rash c skin peeling  . Latex Rash  . Moxifloxacin Palpitations    Tachycardia        PHYSICAL EXAMINATION: Vital signs: BP 124/82  Pulse 75  Ht 5' 2"  (1.575 m)  Wt 222 lb 3.2 oz (100.789 kg)  BMI 40.63 kg/m2  SpO2 97% General: Well-developed, well-nourished, no acute distress HEENT: Sclerae are anicteric, conjunctiva pink. Oral mucosa intact Lungs: Clear Heart: Regular Abdomen: soft, nontender, nondistended, no obvious ascites, no peritoneal signs, normal bowel sounds. No organomegaly. Extremities: No edema Psychiatric: alert and oriented x3. Cooperative   ASSESSMENT:  #1. GERD. Asymptomatic on PPI #2. History of adenomatous colon polyps. Last colonoscopy 2013 #3. IBS. Symptoms are controlled with when necessary Librax #4. Fatty liver. Previously addressed. Unfortunately, no weight loss since her last visit   PLAN:  #1. Reflux precautions #2. Refill Nexium prescription #3. Refill Librax #4. Exercise and weight loss #5. Surveillance colonoscopy around June 2018 #6. Routine GI followup one year

## 2013-09-22 DIAGNOSIS — G4730 Sleep apnea, unspecified: Secondary | ICD-10-CM | POA: Diagnosis not present

## 2013-09-22 DIAGNOSIS — K219 Gastro-esophageal reflux disease without esophagitis: Secondary | ICD-10-CM | POA: Diagnosis not present

## 2013-09-22 DIAGNOSIS — J329 Chronic sinusitis, unspecified: Secondary | ICD-10-CM | POA: Diagnosis not present

## 2013-10-01 DIAGNOSIS — Z1231 Encounter for screening mammogram for malignant neoplasm of breast: Secondary | ICD-10-CM | POA: Diagnosis not present

## 2013-10-04 DIAGNOSIS — M67919 Unspecified disorder of synovium and tendon, unspecified shoulder: Secondary | ICD-10-CM | POA: Diagnosis not present

## 2013-10-04 DIAGNOSIS — M719 Bursopathy, unspecified: Secondary | ICD-10-CM | POA: Diagnosis not present

## 2013-10-04 DIAGNOSIS — M25519 Pain in unspecified shoulder: Secondary | ICD-10-CM | POA: Diagnosis not present

## 2013-10-11 DIAGNOSIS — H8110 Benign paroxysmal vertigo, unspecified ear: Secondary | ICD-10-CM | POA: Diagnosis not present

## 2013-10-19 DIAGNOSIS — C649 Malignant neoplasm of unspecified kidney, except renal pelvis: Secondary | ICD-10-CM | POA: Diagnosis not present

## 2013-10-21 DIAGNOSIS — C649 Malignant neoplasm of unspecified kidney, except renal pelvis: Secondary | ICD-10-CM | POA: Diagnosis not present

## 2013-10-24 DIAGNOSIS — M25519 Pain in unspecified shoulder: Secondary | ICD-10-CM | POA: Diagnosis not present

## 2013-11-01 DIAGNOSIS — M25519 Pain in unspecified shoulder: Secondary | ICD-10-CM | POA: Diagnosis not present

## 2013-11-01 DIAGNOSIS — M24619 Ankylosis, unspecified shoulder: Secondary | ICD-10-CM | POA: Diagnosis not present

## 2013-11-16 DIAGNOSIS — G4733 Obstructive sleep apnea (adult) (pediatric): Secondary | ICD-10-CM | POA: Diagnosis not present

## 2013-11-16 DIAGNOSIS — K219 Gastro-esophageal reflux disease without esophagitis: Secondary | ICD-10-CM | POA: Diagnosis not present

## 2013-11-16 DIAGNOSIS — I10 Essential (primary) hypertension: Secondary | ICD-10-CM | POA: Diagnosis not present

## 2013-11-16 DIAGNOSIS — Z23 Encounter for immunization: Secondary | ICD-10-CM | POA: Diagnosis not present

## 2013-11-19 DIAGNOSIS — M25519 Pain in unspecified shoulder: Secondary | ICD-10-CM | POA: Diagnosis not present

## 2013-11-19 DIAGNOSIS — M7500 Adhesive capsulitis of unspecified shoulder: Secondary | ICD-10-CM | POA: Diagnosis not present

## 2013-11-24 DIAGNOSIS — R42 Dizziness and giddiness: Secondary | ICD-10-CM | POA: Diagnosis not present

## 2013-11-24 DIAGNOSIS — I69998 Other sequelae following unspecified cerebrovascular disease: Secondary | ICD-10-CM | POA: Diagnosis not present

## 2013-12-01 DIAGNOSIS — R42 Dizziness and giddiness: Secondary | ICD-10-CM | POA: Diagnosis not present

## 2013-12-01 DIAGNOSIS — I69998 Other sequelae following unspecified cerebrovascular disease: Secondary | ICD-10-CM | POA: Diagnosis not present

## 2013-12-02 DIAGNOSIS — M25519 Pain in unspecified shoulder: Secondary | ICD-10-CM | POA: Diagnosis not present

## 2013-12-02 DIAGNOSIS — M7500 Adhesive capsulitis of unspecified shoulder: Secondary | ICD-10-CM | POA: Diagnosis not present

## 2013-12-07 DIAGNOSIS — I69998 Other sequelae following unspecified cerebrovascular disease: Secondary | ICD-10-CM | POA: Diagnosis not present

## 2013-12-14 DIAGNOSIS — I69998 Other sequelae following unspecified cerebrovascular disease: Secondary | ICD-10-CM | POA: Diagnosis not present

## 2013-12-17 DIAGNOSIS — M7500 Adhesive capsulitis of unspecified shoulder: Secondary | ICD-10-CM | POA: Diagnosis not present

## 2013-12-17 DIAGNOSIS — M25519 Pain in unspecified shoulder: Secondary | ICD-10-CM | POA: Diagnosis not present

## 2013-12-21 DIAGNOSIS — I69998 Other sequelae following unspecified cerebrovascular disease: Secondary | ICD-10-CM | POA: Diagnosis not present

## 2013-12-21 DIAGNOSIS — R42 Dizziness and giddiness: Secondary | ICD-10-CM | POA: Diagnosis not present

## 2013-12-28 DIAGNOSIS — M76899 Other specified enthesopathies of unspecified lower limb, excluding foot: Secondary | ICD-10-CM | POA: Diagnosis not present

## 2014-01-04 DIAGNOSIS — N390 Urinary tract infection, site not specified: Secondary | ICD-10-CM | POA: Diagnosis not present

## 2014-01-04 DIAGNOSIS — R300 Dysuria: Secondary | ICD-10-CM | POA: Diagnosis not present

## 2014-01-05 DIAGNOSIS — R42 Dizziness and giddiness: Secondary | ICD-10-CM | POA: Diagnosis not present

## 2014-01-05 DIAGNOSIS — I69998 Other sequelae following unspecified cerebrovascular disease: Secondary | ICD-10-CM | POA: Diagnosis not present

## 2014-01-13 DIAGNOSIS — I69998 Other sequelae following unspecified cerebrovascular disease: Secondary | ICD-10-CM | POA: Diagnosis not present

## 2014-01-21 DIAGNOSIS — H8110 Benign paroxysmal vertigo, unspecified ear: Secondary | ICD-10-CM | POA: Diagnosis not present

## 2014-02-12 ENCOUNTER — Other Ambulatory Visit: Payer: Self-pay | Admitting: Internal Medicine

## 2014-02-15 ENCOUNTER — Other Ambulatory Visit: Payer: Self-pay

## 2014-02-15 MED ORDER — CILIDINIUM-CHLORDIAZEPOXIDE 2.5-5 MG PO CAPS: 1.0000 | ORAL_CAPSULE | Freq: Two times a day (BID) | ORAL | Status: DC

## 2014-03-04 DIAGNOSIS — R300 Dysuria: Secondary | ICD-10-CM | POA: Diagnosis not present

## 2014-03-04 DIAGNOSIS — N390 Urinary tract infection, site not specified: Secondary | ICD-10-CM | POA: Diagnosis not present

## 2014-04-25 DIAGNOSIS — M4806 Spinal stenosis, lumbar region: Secondary | ICD-10-CM | POA: Diagnosis not present

## 2014-04-25 DIAGNOSIS — M4310 Spondylolisthesis, site unspecified: Secondary | ICD-10-CM | POA: Diagnosis not present

## 2014-04-25 DIAGNOSIS — M545 Low back pain: Secondary | ICD-10-CM | POA: Diagnosis not present

## 2014-04-28 ENCOUNTER — Other Ambulatory Visit: Payer: Self-pay | Admitting: Internal Medicine

## 2014-05-04 DIAGNOSIS — Z79899 Other long term (current) drug therapy: Secondary | ICD-10-CM | POA: Diagnosis not present

## 2014-05-09 DIAGNOSIS — M47816 Spondylosis without myelopathy or radiculopathy, lumbar region: Secondary | ICD-10-CM | POA: Diagnosis not present

## 2014-05-13 DIAGNOSIS — M545 Low back pain: Secondary | ICD-10-CM | POA: Diagnosis not present

## 2014-05-13 DIAGNOSIS — I10 Essential (primary) hypertension: Secondary | ICD-10-CM | POA: Diagnosis not present

## 2014-05-13 DIAGNOSIS — M25562 Pain in left knee: Secondary | ICD-10-CM | POA: Diagnosis not present

## 2014-05-13 DIAGNOSIS — K219 Gastro-esophageal reflux disease without esophagitis: Secondary | ICD-10-CM | POA: Diagnosis not present

## 2014-05-13 DIAGNOSIS — M79605 Pain in left leg: Secondary | ICD-10-CM | POA: Diagnosis not present

## 2014-05-19 DIAGNOSIS — M545 Low back pain: Secondary | ICD-10-CM | POA: Diagnosis not present

## 2014-05-19 DIAGNOSIS — M47817 Spondylosis without myelopathy or radiculopathy, lumbosacral region: Secondary | ICD-10-CM | POA: Diagnosis not present

## 2014-05-25 ENCOUNTER — Other Ambulatory Visit: Payer: Self-pay | Admitting: Internal Medicine

## 2014-05-25 DIAGNOSIS — Z1212 Encounter for screening for malignant neoplasm of rectum: Secondary | ICD-10-CM | POA: Diagnosis not present

## 2014-05-25 DIAGNOSIS — M62830 Muscle spasm of back: Secondary | ICD-10-CM | POA: Diagnosis not present

## 2014-05-25 DIAGNOSIS — K760 Fatty (change of) liver, not elsewhere classified: Secondary | ICD-10-CM | POA: Diagnosis not present

## 2014-05-25 DIAGNOSIS — I10 Essential (primary) hypertension: Secondary | ICD-10-CM | POA: Diagnosis not present

## 2014-05-25 DIAGNOSIS — R945 Abnormal results of liver function studies: Principal | ICD-10-CM

## 2014-05-25 DIAGNOSIS — R7989 Other specified abnormal findings of blood chemistry: Secondary | ICD-10-CM

## 2014-06-07 ENCOUNTER — Ambulatory Visit
Admission: RE | Admit: 2014-06-07 | Discharge: 2014-06-07 | Disposition: A | Discharge status: Home / Self Care | Payer: Medicare Other | Source: Ambulatory Visit | Attending: Internal Medicine | Admitting: Internal Medicine

## 2014-06-07 DIAGNOSIS — R7989 Other specified abnormal findings of blood chemistry: Secondary | ICD-10-CM

## 2014-06-07 DIAGNOSIS — Z85528 Personal history of other malignant neoplasm of kidney: Secondary | ICD-10-CM | POA: Diagnosis not present

## 2014-06-07 DIAGNOSIS — K760 Fatty (change of) liver, not elsewhere classified: Secondary | ICD-10-CM | POA: Diagnosis not present

## 2014-06-07 DIAGNOSIS — R945 Abnormal results of liver function studies: Principal | ICD-10-CM

## 2014-06-08 ENCOUNTER — Other Ambulatory Visit: Payer: Self-pay | Admitting: Internal Medicine

## 2014-07-27 DIAGNOSIS — H8112 Benign paroxysmal vertigo, left ear: Secondary | ICD-10-CM | POA: Diagnosis not present

## 2014-07-27 DIAGNOSIS — H903 Sensorineural hearing loss, bilateral: Secondary | ICD-10-CM | POA: Diagnosis not present

## 2014-07-27 DIAGNOSIS — H9313 Tinnitus, bilateral: Secondary | ICD-10-CM | POA: Diagnosis not present

## 2014-08-01 DIAGNOSIS — M7072 Other bursitis of hip, left hip: Secondary | ICD-10-CM | POA: Diagnosis not present

## 2014-08-01 DIAGNOSIS — M47817 Spondylosis without myelopathy or radiculopathy, lumbosacral region: Secondary | ICD-10-CM | POA: Diagnosis not present

## 2014-08-01 DIAGNOSIS — M545 Low back pain: Secondary | ICD-10-CM | POA: Diagnosis not present

## 2014-08-09 DIAGNOSIS — M7062 Trochanteric bursitis, left hip: Secondary | ICD-10-CM | POA: Diagnosis not present

## 2014-08-09 DIAGNOSIS — M4306 Spondylolysis, lumbar region: Secondary | ICD-10-CM | POA: Diagnosis not present

## 2014-08-09 DIAGNOSIS — R262 Difficulty in walking, not elsewhere classified: Secondary | ICD-10-CM | POA: Diagnosis not present

## 2014-08-11 DIAGNOSIS — R262 Difficulty in walking, not elsewhere classified: Secondary | ICD-10-CM | POA: Diagnosis not present

## 2014-08-11 DIAGNOSIS — M4306 Spondylolysis, lumbar region: Secondary | ICD-10-CM | POA: Diagnosis not present

## 2014-08-11 DIAGNOSIS — M7062 Trochanteric bursitis, left hip: Secondary | ICD-10-CM | POA: Diagnosis not present

## 2014-08-17 DIAGNOSIS — M7060 Trochanteric bursitis, unspecified hip: Secondary | ICD-10-CM | POA: Diagnosis not present

## 2014-08-17 DIAGNOSIS — M4306 Spondylolysis, lumbar region: Secondary | ICD-10-CM | POA: Diagnosis not present

## 2014-08-17 DIAGNOSIS — M7062 Trochanteric bursitis, left hip: Secondary | ICD-10-CM | POA: Diagnosis not present

## 2014-08-17 DIAGNOSIS — R262 Difficulty in walking, not elsewhere classified: Secondary | ICD-10-CM | POA: Diagnosis not present

## 2014-08-18 DIAGNOSIS — M4306 Spondylolysis, lumbar region: Secondary | ICD-10-CM | POA: Diagnosis not present

## 2014-08-18 DIAGNOSIS — M7060 Trochanteric bursitis, unspecified hip: Secondary | ICD-10-CM | POA: Diagnosis not present

## 2014-08-18 DIAGNOSIS — M7062 Trochanteric bursitis, left hip: Secondary | ICD-10-CM | POA: Diagnosis not present

## 2014-08-18 DIAGNOSIS — R262 Difficulty in walking, not elsewhere classified: Secondary | ICD-10-CM | POA: Diagnosis not present

## 2014-08-24 DIAGNOSIS — R262 Difficulty in walking, not elsewhere classified: Secondary | ICD-10-CM | POA: Diagnosis not present

## 2014-08-24 DIAGNOSIS — M7062 Trochanteric bursitis, left hip: Secondary | ICD-10-CM | POA: Diagnosis not present

## 2014-08-24 DIAGNOSIS — M4306 Spondylolysis, lumbar region: Secondary | ICD-10-CM | POA: Diagnosis not present

## 2014-08-26 DIAGNOSIS — M7062 Trochanteric bursitis, left hip: Secondary | ICD-10-CM | POA: Diagnosis not present

## 2014-08-26 DIAGNOSIS — R262 Difficulty in walking, not elsewhere classified: Secondary | ICD-10-CM | POA: Diagnosis not present

## 2014-08-26 DIAGNOSIS — M4306 Spondylolysis, lumbar region: Secondary | ICD-10-CM | POA: Diagnosis not present

## 2014-08-30 DIAGNOSIS — M7062 Trochanteric bursitis, left hip: Secondary | ICD-10-CM | POA: Diagnosis not present

## 2014-08-30 DIAGNOSIS — M4306 Spondylolysis, lumbar region: Secondary | ICD-10-CM | POA: Diagnosis not present

## 2014-08-30 DIAGNOSIS — R262 Difficulty in walking, not elsewhere classified: Secondary | ICD-10-CM | POA: Diagnosis not present

## 2014-09-01 DIAGNOSIS — M4306 Spondylolysis, lumbar region: Secondary | ICD-10-CM | POA: Diagnosis not present

## 2014-09-01 DIAGNOSIS — R262 Difficulty in walking, not elsewhere classified: Secondary | ICD-10-CM | POA: Diagnosis not present

## 2014-09-01 DIAGNOSIS — M7062 Trochanteric bursitis, left hip: Secondary | ICD-10-CM | POA: Diagnosis not present

## 2014-09-06 DIAGNOSIS — M4306 Spondylolysis, lumbar region: Secondary | ICD-10-CM | POA: Diagnosis not present

## 2014-09-06 DIAGNOSIS — M7062 Trochanteric bursitis, left hip: Secondary | ICD-10-CM | POA: Diagnosis not present

## 2014-09-06 DIAGNOSIS — R262 Difficulty in walking, not elsewhere classified: Secondary | ICD-10-CM | POA: Diagnosis not present

## 2014-09-08 DIAGNOSIS — R262 Difficulty in walking, not elsewhere classified: Secondary | ICD-10-CM | POA: Diagnosis not present

## 2014-09-08 DIAGNOSIS — M7062 Trochanteric bursitis, left hip: Secondary | ICD-10-CM | POA: Diagnosis not present

## 2014-09-08 DIAGNOSIS — M4306 Spondylolysis, lumbar region: Secondary | ICD-10-CM | POA: Diagnosis not present

## 2014-09-11 DIAGNOSIS — G459 Transient cerebral ischemic attack, unspecified: Secondary | ICD-10-CM

## 2014-09-11 HISTORY — DX: Transient cerebral ischemic attack, unspecified: G45.9

## 2014-09-13 ENCOUNTER — Emergency Department (HOSPITAL_COMMUNITY)
Admission: EM | Admit: 2014-09-13 | Discharge: 2014-09-13 | Disposition: A | Discharge status: Home / Self Care | Payer: Medicare Other | Attending: Emergency Medicine | Admitting: Emergency Medicine

## 2014-09-13 ENCOUNTER — Emergency Department (HOSPITAL_COMMUNITY): Payer: Medicare Other

## 2014-09-13 ENCOUNTER — Encounter (HOSPITAL_COMMUNITY): Payer: Self-pay | Admitting: *Deleted

## 2014-09-13 DIAGNOSIS — F419 Anxiety disorder, unspecified: Secondary | ICD-10-CM | POA: Insufficient documentation

## 2014-09-13 DIAGNOSIS — G43109 Migraine with aura, not intractable, without status migrainosus: Secondary | ICD-10-CM

## 2014-09-13 DIAGNOSIS — I10 Essential (primary) hypertension: Secondary | ICD-10-CM | POA: Diagnosis not present

## 2014-09-13 DIAGNOSIS — Z8719 Personal history of other diseases of the digestive system: Secondary | ICD-10-CM | POA: Insufficient documentation

## 2014-09-13 DIAGNOSIS — G43909 Migraine, unspecified, not intractable, without status migrainosus: Secondary | ICD-10-CM | POA: Insufficient documentation

## 2014-09-13 DIAGNOSIS — R262 Difficulty in walking, not elsewhere classified: Secondary | ICD-10-CM | POA: Diagnosis not present

## 2014-09-13 DIAGNOSIS — Z7952 Long term (current) use of systemic steroids: Secondary | ICD-10-CM | POA: Insufficient documentation

## 2014-09-13 DIAGNOSIS — M7062 Trochanteric bursitis, left hip: Secondary | ICD-10-CM | POA: Diagnosis not present

## 2014-09-13 DIAGNOSIS — R011 Cardiac murmur, unspecified: Secondary | ICD-10-CM | POA: Insufficient documentation

## 2014-09-13 DIAGNOSIS — Z8701 Personal history of pneumonia (recurrent): Secondary | ICD-10-CM | POA: Insufficient documentation

## 2014-09-13 DIAGNOSIS — J45909 Unspecified asthma, uncomplicated: Secondary | ICD-10-CM | POA: Diagnosis not present

## 2014-09-13 DIAGNOSIS — Z85528 Personal history of other malignant neoplasm of kidney: Secondary | ICD-10-CM | POA: Insufficient documentation

## 2014-09-13 DIAGNOSIS — G459 Transient cerebral ischemic attack, unspecified: Secondary | ICD-10-CM | POA: Diagnosis not present

## 2014-09-13 DIAGNOSIS — Z86010 Personal history of colonic polyps: Secondary | ICD-10-CM | POA: Insufficient documentation

## 2014-09-13 DIAGNOSIS — E669 Obesity, unspecified: Secondary | ICD-10-CM | POA: Diagnosis not present

## 2014-09-13 DIAGNOSIS — Z91040 Latex allergy status: Secondary | ICD-10-CM | POA: Diagnosis not present

## 2014-09-13 DIAGNOSIS — M4306 Spondylolysis, lumbar region: Secondary | ICD-10-CM | POA: Diagnosis not present

## 2014-09-13 DIAGNOSIS — H538 Other visual disturbances: Secondary | ICD-10-CM | POA: Diagnosis present

## 2014-09-13 DIAGNOSIS — Z79899 Other long term (current) drug therapy: Secondary | ICD-10-CM | POA: Insufficient documentation

## 2014-09-13 DIAGNOSIS — Z8669 Personal history of other diseases of the nervous system and sense organs: Secondary | ICD-10-CM | POA: Diagnosis not present

## 2014-09-13 HISTORY — DX: Malignant neoplasm of unspecified kidney, except renal pelvis: C64.9

## 2014-09-13 LAB — CBC WITH DIFFERENTIAL/PLATELET
BASOS ABS: 0 10*3/uL (ref 0.0–0.1)
BASOS PCT: 0 % (ref 0–1)
EOS PCT: 2 % (ref 0–5)
Eosinophils Absolute: 0.2 10*3/uL (ref 0.0–0.7)
HCT: 38.7 % (ref 36.0–46.0)
Hemoglobin: 12.9 g/dL (ref 12.0–15.0)
Lymphocytes Relative: 26 % (ref 12–46)
Lymphs Abs: 1.9 10*3/uL (ref 0.7–4.0)
MCH: 31.2 pg (ref 26.0–34.0)
MCHC: 33.3 g/dL (ref 30.0–36.0)
MCV: 93.7 fL (ref 78.0–100.0)
MONO ABS: 0.5 10*3/uL (ref 0.1–1.0)
MONOS PCT: 6 % (ref 3–12)
Neutro Abs: 4.8 10*3/uL (ref 1.7–7.7)
Neutrophils Relative %: 66 % (ref 43–77)
Platelets: 205 10*3/uL (ref 150–400)
RBC: 4.13 MIL/uL (ref 3.87–5.11)
RDW: 13 % (ref 11.5–15.5)
WBC: 7.3 10*3/uL (ref 4.0–10.5)

## 2014-09-13 LAB — BASIC METABOLIC PANEL
Anion gap: 11 (ref 5–15)
BUN: 16 mg/dL (ref 6–23)
CHLORIDE: 102 mmol/L (ref 96–112)
CO2: 23 mmol/L (ref 19–32)
Calcium: 9.5 mg/dL (ref 8.4–10.5)
Creatinine, Ser: 0.78 mg/dL (ref 0.50–1.10)
GFR, EST NON AFRICAN AMERICAN: 85 mL/min — AB (ref >90)
GLUCOSE: 90 mg/dL (ref 70–99)
POTASSIUM: 3.7 mmol/L (ref 3.5–5.1)
SODIUM: 136 mmol/L (ref 135–145)

## 2014-09-13 NOTE — ED Provider Notes (Signed)
CSN: [id number]     Arrival date & time 09/13/14  0144 History   First MD Initiated Contact with Patient 09/13/14 [phone]     Chief Complaint  Patient presents with  . Transient Ischemic Attack     (Consider location/radiation/quality/duration/timing/severity/associated sxs/prior Treatment) HPI 69 year old female presents to the emergency department from home with complaint of visual changes and difficulties reading.  Patient reports she has a remote history of migraine headaches.  She has not had headaches and some years.  She reports that occasionally she will get a defect in her vision where things are blurry and one spot.  Usually after she has these episodes of blurred vision, she will feel slightly confused and need to lay down for a little bit.  She denies having headaches with these episodes.  She asked me.  She has these episodes every few months.  Tonight she was watching TV and noticed that she had a blurred spot in her vision.  After lasting a few minutes.  This resolved.  Patient was going to go to bed, but decided to check her Facebook.  She noticed that when she tried to read the words on the computer screen.  They did not make any sense and she could not comprehend them.  She called her husband who is upstairs.  She denies having any difficulties speaking words or understanding words spoken to her.  She attempted to read on a larger computer screen upstairs, was still having difficulties comprehending the written word.  This lasted about 20 minutes and then resolved.  Some time during this episode of reading comprehension difficulty.  She developed a left-sided headache.  At this time.  She denies a difficulties reading.  She has a very dull 1 out of 10 headache on the left side.  She denies any weakness numbness difficulties swallowing, speaking or walking.  No prior history of mini strokes.  Patient has never been evaluated before for her visual changes. Past Medical History  Diagnosis  Date  . Asthma   . GERD (gastroesophageal reflux disease)   . Hypertension   . Obesity   . Allergic rhinitis   . IBS (irritable bowel syndrome)   . Fatty liver disease, nonalcoholic   . Adenomatous colon polyp   . Ulcer   . Dysrhythmia 1999    Dr Johnsie Cancel 2003  . Anxiety   . Pneumonia 2007  . Headache(784.0)     migraines with vision changes but no pain  . Heart murmur   . Cancer of kidney   . Sleep apnea     moderate  . Diverticulosis   . Elevated LFTs   . Renal cell carcinoma    Past Surgical History  Procedure Laterality Date  . Abdominal hysterectomy  1979  . Tonsillectomy    . Breast reduction surgery  Jan 1991  . Kidney surgery Right October 2013     Tumor removed   . Elbow surgery Left    Family History  Problem Relation Age of Onset  . Heart disease Father 46  . Lung cancer Father   . Liver disease Neg Hx   . Heart disease Brother   . Asthma Neg Hx   . Hypertension Other   . Colon cancer Cousin   . Allergies Daughter   . Allergies Brother   . Melanoma Daughter    History  Substance Use Topics  . Smoking status: Never Smoker   . Smokeless tobacco: Never Used  . Alcohol Use: 1.8 oz/week  3 Glasses of wine per week     Comment: daily>>>counselled to reduce ETOH use as of 06-23-13, has 1-2 glasses wine daily 4-5 days week   OB History    No data available     Review of Systems   See History of Present Illness; otherwise all other systems are reviewed and negative  Allergies  Aspirin; Diflucan; Latex; and Moxifloxacin  Home Medications   Prior to Admission medications   Medication Sig Start Date End Date Taking? Authorizing Provider  BIOTIN PO Take 1 capsule by mouth daily after breakfast.   Yes Historical Provider, MD  clidinium-chlordiazePOXIDE (LIBRAX) 5-2.5 MG per capsule TAKE 1 CAPSULE TWICE A DAY Patient taking differently: TAKE 2 CAPSULES IN THE EVENING 04/28/14  Yes Irene Shipper, MD  clonazePAM (KLONOPIN) 1 MG tablet Take 1 mg by  mouth at bedtime.    Yes Historical Provider, MD  diphenhydrAMINE (BENADRYL) 25 MG tablet Take 25 mg by mouth every 8 (eight) hours as needed for allergies.    Yes Historical Provider, MD  fluticasone (FLONASE) 50 MCG/ACT nasal spray Place 2 sprays into the nose daily as needed for rhinitis or allergies.   Yes Historical Provider, MD  metaxalone (SKELAXIN) 800 MG tablet Take 800 mg by mouth at bedtime.   Yes Historical Provider, MD  metoprolol succinate (TOPROL-XL) 100 MG 24 hr tablet Take 100 mg by mouth every evening. Take with or immediately following a meal.   Yes Historical Provider, MD  MILK THISTLE PO Take 1 capsule by mouth daily after breakfast.   Yes Historical Provider, MD  Multiple Vitamin (MULTIVITAMIN WITH MINERALS) TABS Take 1 tablet by mouth every evening.    Yes Historical Provider, MD  NEXIUM 40 MG capsule TAKE 1 CAPSULE TWICE A DAY Patient taking differently: TAKE 1 CAPSULE  DAILY 06/09/14  Yes Irene Shipper, MD  olmesartan (BENICAR) 40 MG tablet Take 40 mg by mouth every evening.   Yes Historical Provider, MD  spironolactone (ALDACTONE) 50 MG tablet Take 50 mg by mouth every morning.   Yes Historical Provider, MD   BP 143/64 mmHg  Pulse 79  Temp(Src) 98.3 F (36.8 C) (Oral)  Resp 21  Ht 5' 3"  (1.6 m)  Wt 212 lb (96.163 kg)  BMI 37.56 kg/m2  SpO2 96% Physical Exam  Constitutional: She is oriented to person, place, and time. She appears well-developed and well-nourished.  HENT:  Head: Normocephalic and atraumatic.  Right Ear: External ear normal.  Left Ear: External ear normal.  Nose: Nose normal.  Mouth/Throat: Oropharynx is clear and moist.  Eyes: Conjunctivae and EOM are normal. Pupils are equal, round, and reactive to light.  Neck: Normal range of motion. Neck supple. No JVD present. No tracheal deviation present. No thyromegaly present.  Cardiovascular: Normal rate, regular rhythm, normal heart sounds and intact distal pulses.  Exam reveals no gallop and no  friction rub.   No murmur heard. Pulmonary/Chest: Effort normal and breath sounds normal. No stridor. No respiratory distress. She has no wheezes. She has no rales. She exhibits no tenderness.  Abdominal: Soft. Bowel sounds are normal. She exhibits no distension and no mass. There is no tenderness. There is no rebound and no guarding.  Musculoskeletal: Normal range of motion. She exhibits no edema or tenderness.  Lymphadenopathy:    She has no cervical adenopathy.  Neurological: She is alert and oriented to person, place, and time. She displays normal reflexes. No cranial nerve deficit. She exhibits normal muscle tone. Coordination normal.  Skin: Skin is warm and dry. No rash noted. No erythema. No pallor.  Psychiatric: She has a normal mood and affect. Her behavior is normal. Judgment and thought content normal.  Nursing note and vitals reviewed.   ED Course  Procedures (including critical care time) Labs Review Labs Reviewed  BASIC METABOLIC PANEL - Abnormal; Notable for the following:    GFR calc non Af Amer 85 (*)    All other components within normal limits  CBC WITH DIFFERENTIAL/PLATELET    Imaging Review Ct Head Wo Contrast  09/13/2014   CLINICAL DATA:  Transient ischemic attack. Headache. Transient blurry vision, occurred approximately 4 hours prior.  EXAM: CT HEAD WITHOUT CONTRAST  TECHNIQUE: Contiguous axial images were obtained from the base of the skull through the vertex without intravenous contrast.  COMPARISON:  Head CT 04/04/2013, brain MRI 04/27/2013  FINDINGS: No intracranial hemorrhage, mass effect, or midline shift. No hydrocephalus. The basilar cisterns are patent. No evidence of territorial infarct. No intracranial fluid collection. Calvarium is intact. Included paranasal sinuses and mastoid air cells are well aerated.  IMPRESSION: No acute intracranial abnormality.   Electronically Signed   By: Jeb Levering M.D.   On: 09/13/2014 02:53     EKG  Interpretation   Date/Time:  Tuesday September 13 2014 01:59:25 EST Ventricular Rate:  84 PR Interval:  164 QRS Duration: 96 QT Interval:  402 QTC Calculation: 475 R Axis:   32 Text Interpretation:  Normal sinus rhythm Anterior infarct , age  undetermined Abnormal ECG Confirmed by Orilla Templeman  MD, Merridy Pascoe ([id number]) on 09/13/2014  4:30:16 AM      MDM   Final diagnoses:  Complicated migraine    69 year old female with a 20 minute period of reading difficulty in comprehension after having what sounds to be a scotoma.  Patient has had similar episodes of scotoma in the past without reading comprehension difficulty.  Case was discussed with Dr. Nicole Kindred on call for neuro hospitalist.  He feels that with a normal head CT.  No further workup would need to be done.  He feels symptoms are most consistent with a complicated migraine.  Patient will be referred to outpatient neurology.    Linton Flemings, MD 09/13/14 [phone]

## 2014-09-13 NOTE — ED Notes (Signed)
Pt reports approx 2300 this evening she was watching television when she began to experience a left-sided headache and blurred vision in "one spot" - pt states shortly after she attempted to do some reading on her laptop however was unable to make out the words, per pt's husband pt never experienced speech difficulties however could not read words. Pt states symptoms have improved however is still experiencing a mild HA. Equal grip strengths, no focal neuro deficits noted on assessment.

## 2014-09-13 NOTE — Discharge Instructions (Signed)
Your workup today showed no signs of stroke, brain tumor, or other abnormality.  Based on your symptoms it is thought that you had a complex migraine causing you to have difficulties with your reading comprehension.  It is recommended that you follow-up with neurology for further workup.  Return to the emergency department for worsening condition or new concerning symptoms    Migraine Headache A migraine headache is an intense, throbbing pain on one or both sides of your head. A migraine can last for 30 minutes to several hours. CAUSES  The exact cause of a migraine headache is not always known. However, a migraine may be caused when nerves in the brain become irritated and release chemicals that cause inflammation. This causes pain. Certain things may also trigger migraines, such as:  Alcohol.  Smoking.  Stress.  Menstruation.  Aged cheeses.  Foods or drinks that contain nitrates, glutamate, aspartame, or tyramine.  Lack of sleep.  Chocolate.  Caffeine.  Hunger.  Physical exertion.  Fatigue.  Medicines used to treat chest pain (nitroglycerine), birth control pills, estrogen, and some blood pressure medicines. SIGNS AND SYMPTOMS  Pain on one or both sides of your head.  Pulsating or throbbing pain.  Severe pain that prevents daily activities.  Pain that is aggravated by any physical activity.  Nausea, vomiting, or both.  Dizziness.  Pain with exposure to bright lights, loud noises, or activity.  General sensitivity to bright lights, loud noises, or smells. Before you get a migraine, you may get warning signs that a migraine is coming (aura). An aura may include:  Seeing flashing lights.  Seeing bright spots, halos, or zigzag lines.  Having tunnel vision or blurred vision.  Having feelings of numbness or tingling.  Having trouble talking.  Having muscle weakness. DIAGNOSIS  A migraine headache is often diagnosed based on:  Symptoms.  Physical  exam.  A CT scan or MRI of your head. These imaging tests cannot diagnose migraines, but they can help rule out other causes of headaches. TREATMENT Medicines may be given for pain and nausea. Medicines can also be given to help prevent recurrent migraines.  HOME CARE INSTRUCTIONS  Only take over-the-counter or prescription medicines for pain or discomfort as directed by your health care provider. The use of long-term narcotics is not recommended.  Lie down in a dark, quiet room when you have a migraine.  Keep a journal to find out what may trigger your migraine headaches. For example, write down:  What you eat and drink.  How much sleep you get.  Any change to your diet or medicines.  Limit alcohol consumption.  Quit smoking if you smoke.  Get 7-9 hours of sleep, or as recommended by your health care provider.  Limit stress.  Keep lights dim if bright lights bother you and make your migraines worse. SEEK IMMEDIATE MEDICAL CARE IF:   Your migraine becomes severe.  You have a fever.  You have a stiff neck.  You have vision loss.  You have muscular weakness or loss of muscle control.  You start losing your balance or have trouble walking.  You feel faint or pass out.  You have severe symptoms that are different from your first symptoms. MAKE SURE YOU:   Understand these instructions.  Will watch your condition.  Will get help right away if you are not doing well or get worse. Document Released: 06/24/2005 Document Revised: 11/08/2013 Document Reviewed: 03/01/2013 [facility] Patient Information 2015 [place], [place]. This information is not intended  to replace advice given to you by your health care provider. Make sure you discuss any questions you have with your health care provider.

## 2014-09-15 DIAGNOSIS — R262 Difficulty in walking, not elsewhere classified: Secondary | ICD-10-CM | POA: Diagnosis not present

## 2014-09-15 DIAGNOSIS — M4306 Spondylolysis, lumbar region: Secondary | ICD-10-CM | POA: Diagnosis not present

## 2014-09-15 DIAGNOSIS — M7062 Trochanteric bursitis, left hip: Secondary | ICD-10-CM | POA: Diagnosis not present

## 2014-09-16 ENCOUNTER — Ambulatory Visit (INDEPENDENT_AMBULATORY_CARE_PROVIDER_SITE_OTHER): Payer: Medicare Other | Admitting: Neurology

## 2014-09-16 ENCOUNTER — Encounter: Payer: Self-pay | Admitting: Neurology

## 2014-09-16 VITALS — BP 116/74 | HR 63 | Ht 62.5 in | Wt 218.6 lb

## 2014-09-16 DIAGNOSIS — R482 Apraxia: Secondary | ICD-10-CM | POA: Insufficient documentation

## 2014-09-16 DIAGNOSIS — G458 Other transient cerebral ischemic attacks and related syndromes: Secondary | ICD-10-CM | POA: Diagnosis not present

## 2014-09-16 DIAGNOSIS — G4733 Obstructive sleep apnea (adult) (pediatric): Secondary | ICD-10-CM

## 2014-09-16 DIAGNOSIS — R488 Other symbolic dysfunctions: Secondary | ICD-10-CM | POA: Diagnosis not present

## 2014-09-16 DIAGNOSIS — Z9989 Dependence on other enabling machines and devices: Secondary | ICD-10-CM

## 2014-09-16 MED ORDER — ASPIRIN EC 81 MG PO TBEC: 81.0000 mg | DELAYED_RELEASE_TABLET | Freq: Every day | ORAL | Status: AC

## 2014-09-16 NOTE — Patient Instructions (Signed)
Transient Ischemic Attack A transient ischemic attack (TIA) is a "warning stroke" that causes stroke-like symptoms. Unlike a stroke, a TIA does not cause permanent damage to the brain. The symptoms of a TIA can happen very fast and do not last long. It is important to know the symptoms of a TIA and what to do. This can help prevent a major stroke or death. CAUSES   A TIA is caused by a temporary blockage in an artery in the brain or neck (carotid artery). The blockage does not allow the brain to get the blood supply it needs and can cause different symptoms. The blockage can be caused by either:  A blood clot.  Fatty buildup (plaque) in a neck or brain artery. RISK FACTORS  High blood pressure (hypertension).  High cholesterol.  Diabetes mellitus.  Heart disease.  The build up of plaque in the blood vessels (peripheral artery disease or atherosclerosis).  The build up of plaque in the blood vessels providing blood and oxygen to the brain (carotid artery stenosis).  An abnormal heart rhythm (atrial fibrillation).  Obesity.  Smoking.  Taking oral contraceptives (especially in combination with smoking).  Physical inactivity.  A diet high in fats, salt (sodium), and calories.  Alcohol use.  Use of illegal drugs (especially cocaine and methamphetamine).  Being female.  Being African American.  Being over the age of 84.  Family history of stroke.  Previous history of blood clots, stroke, TIA, or heart attack.  Sickle cell disease. SYMPTOMS  TIA symptoms are the same as a stroke but are temporary. These symptoms usually develop suddenly, or may be newly present upon awakening from sleep:  Sudden weakness or numbness of the face, arm, or leg, especially on one side of the body.  Sudden trouble walking or difficulty moving arms or legs.  Sudden confusion.  Sudden personality changes.  Trouble speaking (aphasia) or understanding.  Difficulty swallowing.  Sudden  trouble seeing in one or both eyes.  Double vision.  Dizziness.  Loss of balance or coordination.  Sudden severe headache with no known cause.  Trouble reading or writing.  Loss of bowel or bladder control.  Loss of consciousness. DIAGNOSIS  Your caregiver may be able to determine the presence or absence of a TIA based on your symptoms, history, and physical exam. Computed tomography (CT scan) of the brain is usually performed to help identify a TIA. Other tests may be done to diagnose a TIA. These tests may include:  Electrocardiography.  Continuous heart monitoring.  Echocardiography.  Carotid ultrasonography.  Magnetic resonance imaging (MRI).  A scan of the brain circulation.  Blood tests. PREVENTION  The risk of a TIA can be decreased by appropriately treating high blood pressure, high cholesterol, diabetes, heart disease, and obesity and by quitting smoking, limiting alcohol, and staying physically active. TREATMENT  Time is of the essence. Since the symptoms of TIA are the same as a stroke, it is important to seek treatment as soon as possible because you may need a medicine to dissolve the clot (thrombolytic) that cannot be given if too much time has passed. Treatment options vary. Treatment options may include rest, oxygen, intravenous (IV) fluids, and medicines to thin the blood (anticoagulants). Medicines and diet may be used to address diabetes, high blood pressure, and other risk factors. Measures will be taken to prevent short-term and long-term complications, including infection from breathing foreign material into the lungs (aspiration pneumonia), blood clots in the legs, and falls. Treatment options include procedures  to either remove plaque in the carotid arteries or dilate carotid arteries that have narrowed due to plaque. Those procedures are:  Carotid endarterectomy.  Carotid angioplasty and stenting. HOME CARE INSTRUCTIONS   Take all medicines prescribed  by your caregiver. Follow the directions carefully. Medicines may be used to control risk factors for a stroke. Be sure you understand all your medicine instructions.  You may be told to take aspirin or the anticoagulant warfarin. Warfarin needs to be taken exactly as instructed.  Taking too much or too little warfarin is dangerous. Too much warfarin increases the risk of bleeding. Too little warfarin continues to allow the risk for blood clots. While taking warfarin, you will need to have regular blood tests to measure your blood clotting time. A PT blood test measures how long it takes for blood to clot. Your PT is used to calculate another value called an INR. Your PT and INR help your caregiver to adjust your dose of warfarin. The dose can change for many reasons. It is critically important that you take warfarin exactly as prescribed.  Many foods, especially foods high in vitamin K can interfere with warfarin and affect the PT and INR. Foods high in vitamin K include spinach, kale, broccoli, cabbage, collard and turnip greens, brussels sprouts, peas, cauliflower, seaweed, and parsley as well as beef and pork liver, green tea, and soybean oil. You should eat a consistent amount of foods high in vitamin K. Avoid major changes in your diet, or notify your caregiver before changing your diet. Arrange a visit with a dietitian to answer your questions.  Many medicines can interfere with warfarin and affect the PT and INR. You must tell your caregiver about any and all medicines you take, this includes all vitamins and supplements. Be especially cautious with aspirin and anti-inflammatory medicines. Do not take or discontinue any prescribed or over-the-counter medicine except on the advice of your caregiver or pharmacist.  Warfarin can have side effects, such as excessive bruising or bleeding. You will need to hold pressure over cuts for longer than usual. Your caregiver or pharmacist will discuss other  potential side effects.  Avoid sports or activities that may cause injury or bleeding.  Be mindful when shaving, flossing your teeth, or handling sharp objects.  Alcohol can change the body's ability to handle warfarin. It is best to avoid alcoholic drinks or consume only very small amounts while taking warfarin. Notify your caregiver if you change your alcohol intake.  Notify your dentist or other caregivers before procedures.  Eat a diet that includes 5 or more servings of fruits and vegetables each day. This may reduce the risk of stroke. Certain diets may be prescribed to address high blood pressure, high cholesterol, diabetes, or obesity.  A low-sodium, low-saturated fat, low-trans fat, low-cholesterol diet is recommended to manage high blood pressure.  A low-saturated fat, low-trans fat, low-cholesterol, and high-fiber diet may control cholesterol levels.  A controlled-carbohydrate, controlled-sugar diet is recommended to manage diabetes.  A reduced-calorie, low-sodium, low-saturated fat, low-trans fat, low-cholesterol diet is recommended to manage obesity.  Maintain a healthy weight.  Stay physically active. It is recommended that you get at least 30 minutes of activity on most or all days.  Do not smoke.  Limit alcohol use even if you are not taking warfarin. Moderate alcohol use is considered to be:  No more than 2 drinks each day for men.  No more than 1 drink each day for nonpregnant women.  Stop drug abuse.  Home safety. A safe home environment is important to reduce the risk of falls. Your caregiver may arrange for specialists to evaluate your home. Having grab bars in the bedroom and bathroom is often important. Your caregiver may arrange for equipment to be used at home, such as raised toilets and a seat for the shower.  Follow all instructions for follow-up with your caregiver. This is very important. This includes any referrals and lab tests. Proper follow up can  prevent a stroke or another TIA from occurring. SEEK MEDICAL CARE IF:  You have personality changes.  You have difficulty swallowing.  You are seeing double.  You have dizziness.  You have a fever.  You have skin breakdown. SEEK IMMEDIATE MEDICAL CARE IF:  Any of these symptoms may represent a serious problem that is an emergency. Do not wait to see if the symptoms will go away. Get medical help right away. Call your local emergency services (911 in [place] Do not drive yourself to the hospital.  You have sudden weakness or numbness of the face, arm, or leg, especially on one side of the body.  You have sudden trouble walking or difficulty moving arms or legs.  You have sudden confusion.  You have trouble speaking (aphasia) or understanding.  You have sudden trouble seeing in one or both eyes.  You have a loss of balance or coordination.  You have a sudden, severe headache with no known cause.  You have new chest pain or an irregular heartbeat.  You have a partial or total loss of consciousness. MAKE SURE YOU:   Understand these instructions.  Will watch your condition.  Will get help right away if you are not doing well or get worse. Document Released: 04/03/2005 Document Revised: 06/29/2013 Document Reviewed: 09/29/2013 [facility] Patient Information 2015 [place], [place]. This information is not intended to replace advice given to you by your health care provider. Make sure you discuss any questions you have with your health care provider.

## 2014-09-16 NOTE — Progress Notes (Signed)
[facility]   Provider:  Larey [name], M D  Referring Provider: Deland Pretty, MD Primary Care Physician:  Jamie Pel, MD  Chief Complaint  Patient presents with  . NP ER Referral Migraines, Vision changes    Rm 10, husband    HPI:  Jamie [name] is a 69 y.o. female  Is seen here as a referral from the local Emergency Room for an evaluation of Headaches.  The patient has not seen her PCP, Jamie [name]. Shelia [name],  yet . The patient has asthma, ever since a pneumonia bout in 2007.   Is and is here today to be evaluated for a headache, and she reports that she first had migrainous headaches when she resumed birth control pills over 40 years ago. The headaches were very debilitating at the time but there were infrequent through the 1970s. Once she discontinued hormonal contraception her headaches became as infrequent as once a year. In the early 1980s the couple lived in [name] recalls that the bright light was a trigger for headaches -there was a strong photophobic component, and she made many trips to the local hospital for Demerol shots. She underwent a self hypnosis treatment and this worked well, controlled the migraine for years. On 9-20 02-2013 the patient woke up with a severe explosive kind of pain that seemed to arise from the right eye. The pain was centered retro-orbital and radiated backwards into the center of the head. The patient tried aspirin and Motrin with no benefit at the time as my colleague Jamie [name]. Janann Colonel D.O. did document. This was different from her typical migraines. She was extremely nauseated but she could not vomit and developed dry heaves. The headache broker out of sound sleep at about about 3 AM. She had no tearing of the eye ( that she could remember),  the nose was not runny and her face was not flushed  . The headaches did not appear to be SUNCT. She was asleep with her CPAP.  The patient had an eye exam in 2013 which was reportedly  normal at that time and her headaches were evaluated with a brain MRI which was also reviewed and found unremarkable. Not had a number of the severe headache spell since. On 09-12-14 the patient again developed a headache this time of a new quality. She was watching TV at about 11 PM she noticed that the right vision was fluctuating she states that her vision was disturbed by changing in light and dark sensation, that she describedas if it checker board was placed in front of her eyes. By 11.45 , she tried to answer an e mail, couldn't decipher the message, non-sensical to her. She switched and tried to read aloud, she couldn't , her speech output was not correlated to the text she read. Jibberish, while her normal speech was unaffected. On the left side this time by migraines have always attack the right side of her head. This was unusual as well.  The entire episode lasted less than one hour. The emergency room obtained a CT head , non contrast, read as normal and reviewed here in imaging file. Her BP was very high.   She sleeps with a CPAP, and likes it very much, she had nocturia before being diagnosed with OSA.  Her pneumonia related hospitalization in 2007 was the cause for the test by [facility], as her RN had witnessed her sleep apnea. Jamie [name] .Jamie [name] follows her, Jamie [name] Jamie [name] is her ENT.  Review of Systems: Out of a complete 14 system review, the patient complains of only the following symptoms, and all other reviewed systems are negative.  wheezing, fatigue , vertigo, dull HA. Morbidly obese.     History   Social History  . Marital Status: Married    Spouse Name: Jamie [name]  . Number of Children: 2  . Years of Education: 15   Occupational History  . Retired Economist  .     Social History Main Topics  . Smoking status: Never Smoker   . Smokeless tobacco: Never Used  . Alcohol Use: 1.8 oz/week    3 Glasses of wine per week     Comment: daily>>>counselled to reduce ETOH use as  of 06-23-13, has 1-2 glasses wine daily 4-5 days week  . Drug Use: No  . Sexual Activity: Yes    Birth Control/ Protection: Surgical   Other Topics Concern  . Not on file   Social History Narrative   Exercise 3-4 times a week.   Married Jamie [name]) and lives with her spouse.   Retired Personal assistant.   Education- college   Patient is right handed.   Patient drinks tea occasionally .    Family History  Problem Relation Age of Onset  . Heart disease Father 39  . Lung cancer Father   . Liver disease Neg Hx   . Heart disease Brother   . Asthma Neg Hx   . Hypertension Other   . Colon cancer Cousin   . Allergies Daughter   . Allergies Brother   . Melanoma Daughter     Past Medical History  Diagnosis Date  . Asthma   . GERD (gastroesophageal reflux disease)   . Hypertension   . Obesity   . Allergic rhinitis   . IBS (irritable bowel syndrome)   . Fatty liver disease, nonalcoholic   . Adenomatous colon polyp   . Ulcer   . Dysrhythmia 1999    Jamie [name] Jamie [name] 2003  . Anxiety   . Pneumonia 2007  . Headache(784.0)     migraines with vision changes but no pain  . Heart murmur   . Cancer of kidney   . Sleep apnea     moderate  . Diverticulosis   . Elevated LFTs   . Renal cell carcinoma     Past Surgical History  Procedure Laterality Date  . Abdominal hysterectomy  1979  . Tonsillectomy    . Breast reduction surgery  Jan 1991  . Kidney surgery Right October 2013     Tumor removed   . Elbow surgery Left     Current Outpatient Prescriptions  Medication Sig Dispense Refill  . BIOTIN PO Take 1 capsule by mouth daily after breakfast.    . clidinium-chlordiazePOXIDE (LIBRAX) 5-2.5 MG per capsule TAKE 1 CAPSULE TWICE A DAY (Patient taking differently: TAKE 2 CAPSULES IN THE EVENING) 180 capsule 1  . clonazePAM (KLONOPIN) 1 MG tablet Take 1 mg by mouth at bedtime.     . diphenhydrAMINE (BENADRYL) 25 MG tablet Take 25 mg by mouth every 8 (eight) hours as needed for allergies.      . fluticasone (FLONASE) 50 MCG/ACT nasal spray Place 2 sprays into the nose daily as needed for rhinitis or allergies.    . metaxalone (SKELAXIN) 800 MG tablet Take 800 mg by mouth at bedtime.    . metoprolol succinate (TOPROL-XL) 100 MG 24 hr tablet Take 100 mg by mouth every evening.  Take with or immediately following a meal.    . MILK THISTLE PO Take 1 capsule by mouth daily after breakfast.    . Multiple Vitamin (MULTIVITAMIN WITH MINERALS) TABS Take 1 tablet by mouth every evening.     Marland Kitchen NEXIUM 40 MG capsule TAKE 1 CAPSULE TWICE A DAY (Patient taking differently: TAKE 1 CAPSULE  DAILY) 180 capsule 1  . olmesartan (BENICAR) 40 MG tablet Take 40 mg by mouth every evening.    Marland Kitchen spironolactone (ALDACTONE) 50 MG tablet Take 50 mg by mouth every morning.    Marland Kitchen UNABLE TO FIND Med Name: probiotic once daily     Current Facility-Administered Medications  Medication Dose Route Frequency Provider Last Rate Last Dose  . methylPREDNISolone acetate (DEPO-MEDROL) injection 80 mg  80 mg Intra-articular Once Lew Dawes V, MD        Allergies as of 09/16/2014 - Review Complete 09/16/2014  Allergen Reaction Noted  . Aspirin Other (See Comments) 01/01/2008  . Diflucan [fluconazole] Hives 04/13/2012  . Latex Rash 04/01/2012  . Moxifloxacin Palpitations     Vitals: BP 116/74 mmHg  Pulse 63  Ht 5' 2.5" (1.588 m)  Wt 218 lb 9.6 oz (99.156 kg)  BMI 39.32 kg/m2 Last Weight:  Wt Readings from Last 1 Encounters:  09/16/14 218 lb 9.6 oz (99.156 kg)       Last Height:   Ht Readings from Last 1 Encounters:  09/16/14 5' 2.5" (1.588 m)    Physical exam:  General: The patient is awake, alert and appears not in acute distress. The patient is well groomed. Head: Normocephalic, atraumatic. Neck is supple. Mallampati 2 ,  neck circumference:16.5  Nasal airflow unrestricted , TMJ is not  evident . Retrognathia is not seen.  Cardiovascular:  Regular rate and rhythm , without  murmurs or carotid  bruit, and without distended neck veins. Respiratory: Lungs are clear to auscultation. Skin:  Without evidence of edema, or rash Trunk: BMI is elevated and patient has normal posture.  Neurologic exam : The patient is awake and alert, oriented to place and time.   Memory subjective  described as intact. There is a normal attention span & concentration ability. Speech is fluent without  dysarthria, dysphonia or aphasia. Mood and affect are appropriate.  Cranial nerves: Pupils are equal and briskly reactive to light. Funduscopic exam without  evidence of pallor or edema.  Extraocular movements  in vertical and horizontal planes intact and without nystagmus. Visual fields by finger perimetry are intact. Hearing to finger rub intact.  Facial sensation intact to fine touch. Facial motor strength is symmetric and tongue and uvula move midline.  Motor exam:  Normal tone ,muscle bulk and symmetric ,strength in all extremities.  Sensory:  Fine touch, pinprick and vibration were tested in all extremities. Proprioception is  normal.  Coordination: Rapid alternating movements in the fingers/hands is normal. Finger-to-nose maneuver with evidence of ataxia, dysmetria on her left .  Gait and station: Patient walks without assistive device and is able unassisted to climb up to the exam table. Strength within normal limits.  Wide based stance ,  Tandem gait is unsteady -  Romberg testing is  negative.  Deep tendon reflexes: in the  upper and lower extremities are symmetric and intact. Babinski maneuver response is   downgoing.   Assessment:  After physical and neurologic examination, review of laboratory studies, imaging, neurophysiology testing and pre-existing records, assessment is   1) one former headaches of cluster character, but single event,  followed by vertigo- non responsive to Epley manoeuver. Has had this while on CPAP asleep-  EXPLOSIVE Headache without TBI. 2) Alexia and reading apraxia ,  about 45-60 minute spell, 09-12-14. 3) morbid obesity 4) OSA on CPAP - Jamie [name] Jamie [name].  I believe that Mrs. Koke had a TIA on 09-12-14 her pain CT was negative as was expected. I don't see that she was ever evaluated for an aneurysm at all. I will order the full TIA - Work up and place the patient on baby asa, enteric coated. She had ulcers.   The patient was advised of the nature of the diagnosed sleep disorder , the treatment options and risks for general a health and wellness arising from not treating the condition. Visit duration was 35 minutes. 20 minutes of the visit  Was spent in face-to-face discussion of the presumed diagnosis, TIA, the risk factors for TIA which are  sleep apnea  (in this case the patient is treated by CPAP); Elevated body mass index. Mitral valve click. The patient is on 3  BP medications   Plan:  Treatment plan and additional workup : I will asked the patient to use a enteric-coated aspirin of the lowest dose. This is in the [name] 81 mg. In addition I will order a transcranial Doppler and emboli monitoring study. I will asked Jamie [name]. Phoebe Sharps  advice about any further imaging studies to be ordered.   Asencion Partridge Romolo Sieling MD  09/16/2014

## 2014-09-17 ENCOUNTER — Other Ambulatory Visit: Payer: Self-pay | Admitting: Internal Medicine

## 2014-09-19 DIAGNOSIS — G459 Transient cerebral ischemic attack, unspecified: Secondary | ICD-10-CM | POA: Diagnosis not present

## 2014-09-19 DIAGNOSIS — K760 Fatty (change of) liver, not elsewhere classified: Secondary | ICD-10-CM | POA: Diagnosis not present

## 2014-09-26 ENCOUNTER — Ambulatory Visit (INDEPENDENT_AMBULATORY_CARE_PROVIDER_SITE_OTHER): Payer: Medicare Other | Admitting: Internal Medicine

## 2014-09-26 ENCOUNTER — Encounter: Payer: Self-pay | Admitting: Internal Medicine

## 2014-09-26 VITALS — BP 106/60 | HR 60 | Ht 62.5 in | Wt 216.2 lb

## 2014-09-26 DIAGNOSIS — K5900 Constipation, unspecified: Secondary | ICD-10-CM | POA: Diagnosis not present

## 2014-09-26 MED ORDER — CILIDINIUM-CHLORDIAZEPOXIDE 2.5-5 MG PO CAPS: 1.0000 | ORAL_CAPSULE | Freq: Two times a day (BID) | ORAL | Status: DC

## 2014-09-26 MED ORDER — ESOMEPRAZOLE MAGNESIUM 40 MG PO CPDR: 40.0000 mg | DELAYED_RELEASE_CAPSULE | Freq: Two times a day (BID) | ORAL | Status: DC

## 2014-09-26 MED ORDER — LINACLOTIDE 145 MCG PO CAPS: 145.0000 ug | ORAL_CAPSULE | Freq: Every day | ORAL | Status: DC

## 2014-09-26 NOTE — Patient Instructions (Signed)
We have sent the following medications to your pharmacy:  Librax, Nexium  I sent one month of Linzess to [facility] on [address].   Take one capsule 30 minutes before your first meal of the day.  If this works well for you, call me and I will send a prescription to [facility]

## 2014-09-26 NOTE — Progress Notes (Signed)
HISTORY OF PRESENT ILLNESS:  Jamie [name] is a 69 y.o. female with multiple medical problems as listed below. She is followed in this office for history of fatty liver, GERD, IBS, constipation, and adenomatous colon polyps. She was last evaluated March 2015. She presents today for follow-up. She presents today with several active issues. First, constipation not adequately responding to fiber, water, and MiraLAX. She is interested in an alternative therapy that may provide better results. Currently moving her bowels every second or third day. Next, she try to reduce the dose of her PPI due to lay press concerns. However, with this significant reflux particular night. She is currently back on twice a day dosage with good control of symptoms. No dysphagia. She does report having had a TIA recently and is currently undergoing neurologic evaluation. Her last colonoscopy was June 2013. She continues to struggle with her weight. She has chronic back pain.  REVIEW OF SYSTEMS:  All non-GI ROS negative except for back pain, headaches, arthritis, sinus and allergy, urinary leakage  Past Medical History  Diagnosis Date  . Asthma   . GERD (gastroesophageal reflux disease)   . Hypertension   . Obesity   . Allergic rhinitis   . IBS (irritable bowel syndrome)   . Fatty liver disease, nonalcoholic   . Adenomatous colon polyp   . Ulcer   . Dysrhythmia 1999    Dr Johnsie Cancel 2003  . Anxiety   . Pneumonia 2007  . Headache(784.0)     migraines with vision changes but no pain  . Heart murmur   . Cancer of kidney   . Sleep apnea     moderate  . Diverticulosis   . Elevated LFTs   . Renal cell carcinoma   . Colon polyps     adenomatous  . TIA (transient ischemic attack) 09/11/2014    Past Surgical History  Procedure Laterality Date  . Abdominal hysterectomy  1979  . Tonsillectomy    . Breast reduction surgery  Jan 1991  . Kidney surgery Right October 2013     Tumor removed   . Elbow surgery Left      Social History Jamie [name]  reports that she has never smoked. She has never used smokeless tobacco. She reports that she drinks about 1.8 oz of alcohol per week. She reports that she does not use illicit drugs.  family history includes Allergies in her brother and daughter; Colon cancer in her cousin; Heart disease in her brother; Heart disease (age of onset: 26) in her father; Hypertension in her other; Lung cancer in her father; Melanoma in her daughter. There is no history of Liver disease or Asthma.  Allergies  Allergen Reactions  . Aspirin Other (See Comments)    REACTION: ulcers  . Diflucan [Fluconazole] Hives    Whelps and rash c skin peeling  . Latex Rash  . Moxifloxacin Palpitations    Tachycardia        PHYSICAL EXAMINATION: Vital signs: BP 106/60 mmHg  Pulse 60  Ht 5' 2.5" (1.588 m)  Wt 216 lb 4 oz (98.09 kg)  BMI 38.90 kg/m2 General: Well-developed, well-nourished, no acute distress HEENT: Sclerae are anicteric, conjunctiva pink. Oral mucosa intact Lungs: Clear Heart: Regular Abdomen: soft, obese, nontender, nondistended, no obvious ascites, no peritoneal signs, normal bowel sounds. No organomegaly. Extremities: No edema Psychiatric: alert and oriented x3. Cooperative     ASSESSMENT:  #1. Constipation. Problematic at present. Not responding to fiber, water, MiraLAX #2. GERD. Requires twice  a day dosage for good symptom control #3. IBS #4. Fatty liver #5. History of adenomatous colon polyps  PLAN:  #1. Prescribe Linzess 145 g daily #2. Reflux precautions with attention to weight loss #3. Continue Nexium 40 mg twice a day. Prescription refilled #4. Exercise and weight loss as mainstay for fatty liver disease #5. Refill Librax as requested #6. Surveillance colonoscopy around June 2018 #7. Routine GI follow-up one year

## 2014-09-29 ENCOUNTER — Ambulatory Visit (INDEPENDENT_AMBULATORY_CARE_PROVIDER_SITE_OTHER): Payer: Medicare Other

## 2014-09-29 DIAGNOSIS — G4733 Obstructive sleep apnea (adult) (pediatric): Secondary | ICD-10-CM

## 2014-09-29 DIAGNOSIS — R482 Apraxia: Secondary | ICD-10-CM

## 2014-09-29 DIAGNOSIS — G458 Other transient cerebral ischemic attacks and related syndromes: Secondary | ICD-10-CM | POA: Diagnosis not present

## 2014-09-29 DIAGNOSIS — Z9989 Dependence on other enabling machines and devices: Secondary | ICD-10-CM

## 2014-10-07 ENCOUNTER — Telehealth: Payer: Self-pay | Admitting: Internal Medicine

## 2014-10-07 MED ORDER — ESOMEPRAZOLE MAGNESIUM 40 MG PO CPDR: 40.0000 mg | DELAYED_RELEASE_CAPSULE | Freq: Two times a day (BID) | ORAL | Status: DC

## 2014-10-07 MED ORDER — LINACLOTIDE 145 MCG PO CAPS: 145.0000 ug | ORAL_CAPSULE | Freq: Every day | ORAL | Status: DC

## 2014-10-07 NOTE — Telephone Encounter (Signed)
Resent esomeprazole rx to [facility] - 1 tablet twice a day

## 2014-10-11 NOTE — Telephone Encounter (Signed)
rx sent to [facility]

## 2014-10-18 ENCOUNTER — Telehealth: Payer: Self-pay | Admitting: Neurology

## 2014-10-18 ENCOUNTER — Other Ambulatory Visit (HOSPITAL_COMMUNITY): Payer: Self-pay | Admitting: Urology

## 2014-10-18 ENCOUNTER — Ambulatory Visit (HOSPITAL_COMMUNITY)
Admission: RE | Admit: 2014-10-18 | Discharge: 2014-10-18 | Disposition: A | Discharge status: Home / Self Care | Payer: Medicare Other | Source: Ambulatory Visit | Attending: Urology | Admitting: Urology

## 2014-10-18 DIAGNOSIS — Z85528 Personal history of other malignant neoplasm of kidney: Secondary | ICD-10-CM | POA: Insufficient documentation

## 2014-10-18 DIAGNOSIS — C649 Malignant neoplasm of unspecified kidney, except renal pelvis: Secondary | ICD-10-CM | POA: Diagnosis not present

## 2014-10-18 DIAGNOSIS — R312 Other microscopic hematuria: Secondary | ICD-10-CM | POA: Diagnosis not present

## 2014-10-18 DIAGNOSIS — K760 Fatty (change of) liver, not elsewhere classified: Secondary | ICD-10-CM | POA: Diagnosis not present

## 2014-10-18 DIAGNOSIS — Z8553 Personal history of malignant neoplasm of renal pelvis: Secondary | ICD-10-CM | POA: Diagnosis not present

## 2014-10-18 DIAGNOSIS — Z9889 Other specified postprocedural states: Secondary | ICD-10-CM | POA: Diagnosis not present

## 2014-10-18 NOTE — Telephone Encounter (Signed)
I called the patient. The transcranial Doppler bubble study done shows some evidence of cerebral emboli, only to emboli noted on the right middle cerebral, 6 on the left. This suggests a minimal PFO, not likely to be clinically significant. I discussed this with patient.

## 2014-10-24 DIAGNOSIS — L821 Other seborrheic keratosis: Secondary | ICD-10-CM | POA: Diagnosis not present

## 2014-10-24 DIAGNOSIS — L720 Epidermal cyst: Secondary | ICD-10-CM | POA: Diagnosis not present

## 2014-10-24 DIAGNOSIS — L918 Other hypertrophic disorders of the skin: Secondary | ICD-10-CM | POA: Diagnosis not present

## 2014-10-24 DIAGNOSIS — D1801 Hemangioma of skin and subcutaneous tissue: Secondary | ICD-10-CM | POA: Diagnosis not present

## 2014-10-25 ENCOUNTER — Ambulatory Visit: Payer: Medicare Other | Admitting: Adult Health

## 2014-11-19 ENCOUNTER — Other Ambulatory Visit: Payer: Self-pay | Admitting: Internal Medicine

## 2014-11-22 ENCOUNTER — Other Ambulatory Visit: Payer: Self-pay | Admitting: Internal Medicine

## 2014-11-28 ENCOUNTER — Ambulatory Visit: Payer: Medicare Other | Admitting: Nurse Practitioner

## 2014-11-28 ENCOUNTER — Ambulatory Visit (INDEPENDENT_AMBULATORY_CARE_PROVIDER_SITE_OTHER): Payer: Medicare Other | Admitting: Nurse Practitioner

## 2014-11-28 ENCOUNTER — Encounter: Payer: Self-pay | Admitting: Nurse Practitioner

## 2014-11-28 VITALS — BP 118/80 | HR 72 | Resp 12 | Ht 62.5 in | Wt 216.6 lb

## 2014-11-28 DIAGNOSIS — G458 Other transient cerebral ischemic attacks and related syndromes: Secondary | ICD-10-CM | POA: Diagnosis not present

## 2014-11-28 NOTE — Patient Instructions (Signed)
Continue baby aspirin for secondary stroke prevention Blood pressure in excellent control today 118/80 Low intensity exercise for overall health and well-being Continue CPAP for obstructive sleep apnea Follow-up in 6-8 months

## 2014-11-28 NOTE — Progress Notes (Signed)
[facility]  PATIENT: Jamie [name] DOB: 03/19/1946   REASON FOR VISIT: Follow-up for suspected TIA  HISTORY FROM: Patient    HISTORY OF PRESENT ILLNESS: HISTORY (CD)Is and is here today to be evaluated for a headache, and she reports that she first had migrainous headaches when she resumed birth control pills over 40 years ago. The headaches were very debilitating at the time but there were infrequent through the 1970s. Once she discontinued hormonal contraception her headaches became as infrequent as once a year. In the early 1980s the couple lived in [place] and Mrs. Wirsing recalls that the bright light was a trigger for headaches -there was a strong photophobic component, and she made many trips to the local hospital for Demerol shots. She underwent a self hypnosis treatment and this worked well, controlled the migraine for years. On 9-20 02-2013 the patient woke up with a severe explosive kind of pain that seemed to arise from the right eye. The pain was centered retro-orbital and radiated backwards into the center of the head. The patient tried aspirin and Motrin with no benefit at the time as my colleague Dr. Janann Colonel D.O. did document. This was different from her typical migraines. She was extremely nauseated but she could not vomit and developed dry heaves. The headache broker out of sound sleep at about about 3 AM. She had no tearing of the eye ( that she could remember), the nose was not runny and her face was not flushed . The headaches did not appear to be SUNCT. She was asleep with her CPAP.  The patient had an eye exam in 2013 which was reportedly normal at that time and her headaches were evaluated with a brain MRI which was also reviewed and found unremarkable. Not had a number of the severe headache spell since. On 09-12-14 the patient again developed a headache this time of a new quality. She was watching TV at about 11 PM she noticed that the right vision  was fluctuating she states that her vision was disturbed by changing in light and dark sensation, that she describedas if it checker board was placed in front of her eyes. By 11.45 , she tried to answer an e mail, couldn't decipher the message, non-sensical to her. She switched and tried to read aloud, she couldn't , her speech output was not correlated to the text she read. Jibberish, while her normal speech was unaffected. On the left side this time by migraines have always attack the right side of her head. This was unusual as well. The entire episode lasted less than one hour. The emergency room obtained a CT head , non contrast, read as normal and reviewed here in imaging file. Her BP was very high.  She sleeps with a CPAP, and likes it very much, she had nocturia before being diagnosed with OSA.  Her pneumonia related hospitalization in 2007 was the cause for the test by [facility], as her RN had witnessed her sleep apnea. Dr .Elsworth Soho follows her, Dr Constance Holster is her ENT.   UPDATE: Nov 28, 2014. Jamie [name], 69 year old female returns for follow-up. She was initially evaluated by Dr.Dohmeier on  09/16/2014.  She is now on aspirin enteric-coated and has not had further TIA events. She also has obstructive sleep apnea and uses CPAP every night. She gets very little exercise due to chronic vertigo her transcranial Doppler showed evidence of cerebral emboli would suggest a minimal PFO not likely to be clinically significant per Dr.  Willis's note. Her blood pressure is well controlled today at 118/80 in the office.She returns for reevaluation    REVIEW OF SYSTEMS: Full 14 system review of systems performed and notable only for those listed, all others are neg:  Constitutional: neg  Cardiovascular: neg Ear/Nose/Throat: neg  Skin: neg EyesBlurred vision Respiratory : neg Gastroitestinal: neg  Hematology/Lymphatic: neg  Endocrine: neg Musculoskeletal:Joint pain or back pain muscle cramps Allergy /Immunology:  neg Neurological: Dizziness Psychiatric : neg Sleep : neg   ALLERGIES: Allergies  Allergen Reactions  . Aspirin Other (See Comments)    REACTION: ulcers  . Diflucan [Fluconazole] Hives    Whelps and rash c skin peeling  . Latex Rash  . Moxifloxacin Palpitations    Tachycardia     HOME MEDICATIONS: Outpatient Prescriptions Prior to Visit  Medication Sig Dispense Refill  . aspirin EC 81 MG tablet Take 1 tablet (81 mg total) by mouth daily. 90 tablet 3  . BIOTIN PO Take 1 capsule by mouth daily after breakfast.    . clidinium-chlordiazePOXIDE (LIBRAX) 5-2.5 MG per capsule Take 1 capsule by mouth 2 (two) times daily. 180 capsule 1  . clonazePAM (KLONOPIN) 1 MG tablet Take 1 mg by mouth at bedtime.     . diphenhydrAMINE (BENADRYL) 25 MG tablet Take 25 mg by mouth every 8 (eight) hours as needed for allergies.     . fluticasone (FLONASE) 50 MCG/ACT nasal spray Place 2 sprays into the nose daily as needed for rhinitis or allergies.    . Linaclotide (LINZESS) 145 MCG CAPS capsule Take 1 capsule (145 mcg total) by mouth daily. 30 capsule 3  . metaxalone (SKELAXIN) 800 MG tablet Take 800 mg by mouth at bedtime.    . metoprolol succinate (TOPROL-XL) 100 MG 24 hr tablet Take 100 mg by mouth every evening. Take with or immediately following a meal.    . MILK THISTLE PO Take 1 capsule by mouth daily after breakfast.    . Multiple Vitamin (MULTIVITAMIN WITH MINERALS) TABS Take 1 tablet by mouth every evening.     Marland Kitchen NEXIUM 40 MG capsule TAKE 1 CAPSULE TWICE A DAY 180 capsule 1  . olmesartan (BENICAR) 40 MG tablet Take 40 mg by mouth every evening.    Marland Kitchen spironolactone (ALDACTONE) 50 MG tablet Take 50 mg by mouth every morning.    Marland Kitchen UNABLE TO FIND Med Name: probiotic once daily (10 billion units)     Facility-Administered Medications Prior to Visit  Medication Dose Route Frequency Provider Last Rate Last Dose  . methylPREDNISolone acetate (DEPO-MEDROL) injection 80 mg  80 mg Intra-articular  Once Cassandria Anger, MD        PAST MEDICAL HISTORY: Past Medical History  Diagnosis Date  . Asthma   . GERD (gastroesophageal reflux disease)   . Hypertension   . Obesity   . Allergic rhinitis   . IBS (irritable bowel syndrome)   . Fatty liver disease, nonalcoholic   . Adenomatous colon polyp   . Ulcer   . Dysrhythmia 1999    Dr Johnsie Cancel 2003  . Anxiety   . Pneumonia 2007  . Headache(784.0)     migraines with vision changes but no pain  . Heart murmur   . Cancer of kidney   . Sleep apnea     moderate  . Diverticulosis   . Elevated LFTs   . Renal cell carcinoma   . Colon polyps     adenomatous  . TIA (transient ischemic attack) 09/11/2014  PAST SURGICAL HISTORY: Past Surgical History  Procedure Laterality Date  . Abdominal hysterectomy  1979  . Tonsillectomy    . Breast reduction surgery  Jan 1991  . Kidney surgery Right October 2013     Tumor removed   . Elbow surgery Left     FAMILY HISTORY: Family History  Problem Relation Age of Onset  . Heart disease Father 74  . Lung cancer Father   . Liver disease Neg Hx   . Heart disease Brother   . Asthma Neg Hx   . Hypertension Other   . Colon cancer Cousin   . Allergies Daughter   . Allergies Brother   . Melanoma Daughter     SOCIAL HISTORY: History   Social History  . Marital Status: Married    Spouse Name: Sallyanne Kuster  . Number of Children: 2  . Years of Education: 15   Occupational History  . Retired Economist  .     Social History Main Topics  . Smoking status: Never Smoker   . Smokeless tobacco: Never Used  . Alcohol Use: 1.8 oz/week    3 Glasses of wine per week     Comment: daily>>>counselled to reduce ETOH use as of 06-23-13, has 1-2 glasses wine daily 4-5 days week  . Drug Use: No  . Sexual Activity: Yes    Birth Control/ Protection: Surgical   Other Topics Concern  . Not on file   Social History Narrative   Exercise 3-4 times a week.   Married Sallyanne Kuster)  and lives with her spouse.   Retired Personal assistant.   Education- college   Patient is right handed.   Patient drinks tea occasionally .     PHYSICAL EXAM  Filed Vitals:   11/28/14 1113  BP: 118/80  Pulse: 72  Resp: 12  Height: 5' 2.5" (1.588 m)  Weight: 216 lb 9.6 oz (98.249 kg)   Body mass index is 38.96 kg/(m^2).  Generalized: Well developed,  obese female in no acute distress  Head: normocephalic and atraumatic,. Oropharynx benign  Neck: Supple, no carotid bruits  Cardiac: Regular rate rhythm, no murmur  Musculoskeletal: No deformity   Neurological examination   Mentation: Alert oriented to time, place, history taking. Attention span and concentration appropriate. Recent and remote memory intact.  Follows all commands speech and language fluent. Cranial nerves Pupils were equal round reactive to light extraocular movements were full, visual field were full on confrontational test. Facial sensation and strength were normal. hearing was intact to finger rubbing bilaterally. Uvula tongue midline. head turning and shoulder shrug were normal and symmetric.Tongue protrusion into cheek strength was normal. Motor: normal bulk and tone, full strength in the BUE, BLE, fine finger movements normal, no pronator drift. No focal weakness Sensory: normal and symmetric to light touch, pinprick, and  Vibration, proprioception  Coordination: finger-nose-finger, heel-to-shin bilaterally, no dysmetria Reflexes: Brachioradialis 2/2, biceps 2/2, triceps 2/2, patellar 2/2, Achilles 2/2, plantar responses were flexor bilaterally. Gait and Station: Rising up from seated position without assistance, normal stance,  moderate stride, good arm swing, smooth turning, able to perform tiptoe, and heel walking without difficulty. Tandem gait is unsteady. Romberg is negative, no assistive device   DIAGNOSTIC DATA (LABS, IMAGING, TESTING) - I reviewed patient records, labs, notes, testing and imaging myself where  available.  Lab Results  Component Value Date   WBC 7.3 09/13/2014   HGB 12.9 09/13/2014   HCT 38.7 09/13/2014   MCV  93.7 09/13/2014   PLT 205 09/13/2014      Component Value Date/Time   NA 136 09/13/2014 0232   K 3.7 09/13/2014 0232   CL 102 09/13/2014 0232   CO2 23 09/13/2014 0232   GLUCOSE 90 09/13/2014 0232   BUN 16 09/13/2014 0232   CREATININE 0.78 09/13/2014 0232   CALCIUM 9.5 09/13/2014 0232   PROT 7.0 04/08/2012 1100   ALBUMIN 3.9 04/08/2012 1100   AST 33 04/08/2012 1100   ALT 59* 04/08/2012 1100   ALKPHOS 49 04/08/2012 1100   BILITOT 0.3 04/08/2012 1100   GFRNONAA 85* 09/13/2014 0232   GFRAA >90 09/13/2014 0232     ASSESSMENT AND PLAN  69 y.o. year old female  has a past medical history of Asthma;  Sleep apnea; with CPAP and  TIA (transient ischemic attack) (09/11/2014)here to follow-up   Continue baby aspirin for secondary stroke prevention Blood pressure in excellent control today 118/80 Low intensity exercise for overall health and well-being Continue CPAP for obstructive sleep apnea Follow-up in 6-8 months Dennie Bible, [facility], [facility], APRN  [facility] [address] [place], [place] [address] [phone]

## 2014-11-28 NOTE — Progress Notes (Signed)
I agree with the assessment and plan as directed by NP .The patient is known to me .   Daiana Vitiello, MD  

## 2014-12-08 DIAGNOSIS — K760 Fatty (change of) liver, not elsewhere classified: Secondary | ICD-10-CM | POA: Diagnosis not present

## 2014-12-08 DIAGNOSIS — E669 Obesity, unspecified: Secondary | ICD-10-CM | POA: Diagnosis not present

## 2014-12-08 DIAGNOSIS — I10 Essential (primary) hypertension: Secondary | ICD-10-CM | POA: Diagnosis not present

## 2014-12-08 DIAGNOSIS — G459 Transient cerebral ischemic attack, unspecified: Secondary | ICD-10-CM | POA: Diagnosis not present

## 2015-01-03 DIAGNOSIS — T148 Other injury of unspecified body region: Secondary | ICD-10-CM | POA: Diagnosis not present

## 2015-01-03 DIAGNOSIS — Z5189 Encounter for other specified aftercare: Secondary | ICD-10-CM | POA: Diagnosis not present

## 2015-03-20 ENCOUNTER — Other Ambulatory Visit: Payer: Self-pay | Admitting: Internal Medicine

## 2015-03-24 ENCOUNTER — Other Ambulatory Visit: Payer: Self-pay | Admitting: Internal Medicine

## 2015-04-21 DIAGNOSIS — Z23 Encounter for immunization: Secondary | ICD-10-CM | POA: Diagnosis not present

## 2015-05-09 DIAGNOSIS — J029 Acute pharyngitis, unspecified: Secondary | ICD-10-CM | POA: Diagnosis not present

## 2015-05-09 DIAGNOSIS — F419 Anxiety disorder, unspecified: Secondary | ICD-10-CM | POA: Diagnosis not present

## 2015-05-09 DIAGNOSIS — J069 Acute upper respiratory infection, unspecified: Secondary | ICD-10-CM | POA: Diagnosis not present

## 2015-05-21 ENCOUNTER — Other Ambulatory Visit: Payer: Self-pay | Admitting: Internal Medicine

## 2015-06-05 ENCOUNTER — Encounter: Payer: Self-pay | Admitting: Nurse Practitioner

## 2015-06-05 ENCOUNTER — Ambulatory Visit (INDEPENDENT_AMBULATORY_CARE_PROVIDER_SITE_OTHER): Payer: Medicare Other | Admitting: Nurse Practitioner

## 2015-06-05 VITALS — BP 111/70 | HR 71 | Ht 62.5 in | Wt 226.2 lb

## 2015-06-05 DIAGNOSIS — G4733 Obstructive sleep apnea (adult) (pediatric): Secondary | ICD-10-CM

## 2015-06-05 DIAGNOSIS — R51 Headache: Secondary | ICD-10-CM

## 2015-06-05 DIAGNOSIS — G458 Other transient cerebral ischemic attacks and related syndromes: Secondary | ICD-10-CM

## 2015-06-05 DIAGNOSIS — R519 Headache, unspecified: Secondary | ICD-10-CM

## 2015-06-05 NOTE — Patient Instructions (Addendum)
Continue baby aspirin for secondary stroke prevention Blood pressure in excellent control today 111/70 Stay well hydrated with water Continue CPAP for obstructive sleep apnea Follow-up in 6-8 months, next with Dr. Brett Fairy

## 2015-06-05 NOTE — Progress Notes (Signed)
[facility]  PATIENT: Jamie [name] DOB: 09/03/1945   REASON FOR VISIT: Follow-up for TIA, migraines, CPAP with Dr.Pharr HISTORY FROM: Patient    HISTORY OF PRESENT ILLNESS:HISTORY (CD)Is and is here today to be evaluated for a headache, and she reports that she first had migrainous headaches when she resumed birth control pills over 40 years ago. The headaches were very debilitating at the time but there were infrequent through the 1970s. Once she discontinued hormonal contraception her headaches became as infrequent as once a year. In the early 1980s the couple lived in [place] and Jamie [name] recalls that the bright light was a trigger for headaches -there was a strong photophobic component, and she made many trips to the local hospital for Demerol shots. She underwent a self hypnosis treatment and this worked well, controlled the migraine for years. On 9-20 02-2013 the patient woke up with a severe explosive kind of pain that seemed to arise from the right eye. The pain was centered retro-orbital and radiated backwards into the center of the head. The patient tried aspirin and Motrin with no benefit at the time as my colleague Dr. Janann Colonel D.O. did document. This was different from her typical migraines. She was extremely nauseated but she could not vomit and developed dry heaves. The headache broker out of sound sleep at about about 3 AM. She had no tearing of the eye ( that she could remember), the nose was not runny and her face was not flushed . The headaches did not appear to be SUNCT. She was asleep with her CPAP.  The patient had an eye exam in 2013 which was reportedly normal at that time and her headaches were evaluated with a brain MRI which was also reviewed and found unremarkable. Not had a number of the severe headache spell since. On 09-12-14 the patient again developed a headache this time of a new quality. She was watching TV at about 11 PM she noticed that  the right vision was fluctuating she states that her vision was disturbed by changing in light and dark sensation, that she describedas if it checker board was placed in front of her eyes. By 11.45 , she tried to answer an e mail, couldn't decipher the message, non-sensical to her. She switched and tried to read aloud, she couldn't , her speech output was not correlated to the text she read. Jibberish, while her normal speech was unaffected. On the left side this time by migraines have always attack the right side of her head. This was unusual as well. The entire episode lasted less than one hour. The emergency room obtained a CT head , non contrast, read as normal and reviewed here in imaging file. Her BP was very high.  She sleeps with a CPAP, and likes it very much, she had nocturia before being diagnosed with OSA.  Her pneumonia related hospitalization in 2007 was the cause for the test by [facility], as her RN had witnessed her sleep apnea. Dr .Elsworth Soho follows her, Dr Constance Holster is her ENT.   UPDATE: Nov 28, 2014. CMMs. Jamie [name], 69 year old female returns for follow-up. She was initially evaluated by Dr.Dohmeier on 09/16/2014. She is now on aspirin enteric-coated and has not had further TIA events. She also has obstructive sleep apnea and uses CPAP every night. She gets very little exercise due to chronic vertigo her transcranial Doppler showed evidence of cerebral emboli would suggest a minimal PFO not likely to be clinically significant per Dr. Tobey Grim  note. Her blood pressure is well controlled today at 118/80 in the office.She returns for reevaluation UPDATE 11/28/2016CM [name] 69 year old female returns for follow-up. She has not had further stroke or TIA episodes. She has had one fall since last seen. She had a couple episodes of blurred vision without headache. She has not had any speech arrest, weakness on one side or the other etc. she continues to use her CPAP every night. She remains on  enteric-coated aspirin. She continues to get little exercise due to her chronic vertigo. Her transcranial Dopplers showed minimal PFO not likely to be clinically significant. She returns for reevaluation.    REVIEW OF SYSTEMS: Full 14 system review of systems performed and notable only for those listed, all others are neg:  Constitutional: neg  Cardiovascular: neg Ear/Nose/Throat: Ringing in the ears Skin: neg Eyes: neg Respiratory: neg Gastroitestinal: Urinary frequency Hematology/Lymphatic: neg  Endocrine: Intolerance to cold Musculoskeletal: Muscle cramps joint pain Allergy/Immunology: Environmental allergies Neurological: Chronic vertigo Psychiatric: Anxiety Sleep : neg   ALLERGIES: Allergies  Allergen Reactions  . Aspirin Other (See Comments)    REACTION: ulcers  . Diflucan [Fluconazole] Hives    Whelps and rash c skin peeling  . Latex Rash  . Moxifloxacin Palpitations    Tachycardia     HOME MEDICATIONS: Outpatient Prescriptions Prior to Visit  Medication Sig Dispense Refill  . aspirin EC 81 MG tablet Take 1 tablet (81 mg total) by mouth daily. 90 tablet 3  . BIOTIN PO Take 1 capsule by mouth daily after breakfast.    . clidinium-chlordiazePOXIDE (LIBRAX) 5-2.5 MG per capsule TAKE 1 CAPSULE TWICE A DAY 180 capsule 0  . clonazePAM (KLONOPIN) 1 MG tablet Take 1 mg by mouth at bedtime.     . diphenhydrAMINE (BENADRYL) 25 MG tablet Take 25 mg by mouth every 8 (eight) hours as needed for allergies.     . fluticasone (FLONASE) 50 MCG/ACT nasal spray Place 2 sprays into the nose daily as needed for rhinitis or allergies.    Marland Kitchen LINZESS 145 MCG CAPS capsule TAKE 1 CAPSULE DAILY 90 capsule 0  . metaxalone (SKELAXIN) 800 MG tablet Take 800 mg by mouth at bedtime.    . metoprolol succinate (TOPROL-XL) 100 MG 24 hr tablet Take 100 mg by mouth every evening. Take with or immediately following a meal.    . MILK THISTLE PO Take 1 capsule by mouth daily after breakfast.    .  Multiple Vitamin (MULTIVITAMIN WITH MINERALS) TABS Take 1 tablet by mouth every evening.     Marland Kitchen NEXIUM 40 MG capsule TAKE 1 CAPSULE TWICE A DAY 180 capsule 2  . olmesartan (BENICAR) 40 MG tablet Take 40 mg by mouth every evening.    Marland Kitchen spironolactone (ALDACTONE) 50 MG tablet Take 50 mg by mouth every morning.    Marland Kitchen UNABLE TO FIND Med Name: probiotic once daily (10 billion units)     Facility-Administered Medications Prior to Visit  Medication Dose Route Frequency Provider Last Rate Last Dose  . methylPREDNISolone acetate (DEPO-MEDROL) injection 80 mg  80 mg Intra-articular Once Cassandria Anger, MD        PAST MEDICAL HISTORY: Past Medical History  Diagnosis Date  . Asthma   . GERD (gastroesophageal reflux disease)   . Hypertension   . Obesity   . Allergic rhinitis   . IBS (irritable bowel syndrome)   . Fatty liver disease, nonalcoholic   . Adenomatous colon polyp   . Ulcer   . Dysrhythmia  1999    Dr Johnsie Cancel 2003  . Anxiety   . Pneumonia 2007  . Headache(784.0)     migraines with vision changes but no pain  . Heart murmur   . Cancer of kidney ([place])   . Sleep apnea     moderate  . Diverticulosis   . Elevated LFTs   . Renal cell carcinoma ([place])   . Colon polyps     adenomatous  . TIA (transient ischemic attack) 09/11/2014    PAST SURGICAL HISTORY: Past Surgical History  Procedure Laterality Date  . Abdominal hysterectomy  1979  . Tonsillectomy    . Breast reduction surgery  Jan 1991  . Kidney surgery Right October 2013     Tumor removed   . Elbow surgery Left     FAMILY HISTORY: Family History  Problem Relation Age of Onset  . Heart disease Father 47  . Lung cancer Father   . Liver disease Neg Hx   . Heart disease Brother   . Asthma Neg Hx   . Hypertension Other   . Colon cancer Cousin   . Allergies Daughter   . Allergies Brother   . Melanoma Daughter     SOCIAL HISTORY: Social History   Social History  . Marital Status: Married    Spouse Name: Sallyanne Kuster  . Number of Children: 2  . Years of Education: 15   Occupational History  . Retired Economist  .     Social History Main Topics  . Smoking status: Never Smoker   . Smokeless tobacco: Never Used  . Alcohol Use: 1.8 oz/week    3 Glasses of wine per week     Comment: daily>>>counselled to reduce ETOH use as of 06-23-13, has 1-2 glasses wine daily 4-5 days week  . Drug Use: No  . Sexual Activity: Yes    Birth Control/ Protection: Surgical   Other Topics Concern  . Not on file   Social History Narrative   Exercise 3-4 times a week.   Married Sallyanne Kuster) and lives with her spouse.   Retired Personal assistant.   Education- college   Patient is right handed.   Patient drinks tea occasionally .     PHYSICAL EXAM  Filed Vitals:   06/05/15 1320  BP: 111/70  Pulse: 71  Height: 5' 2.5" (1.588 m)  Weight: 226 lb 3.2 oz (102.604 kg)   Body mass index is 40.69 kg/(m^2). Generalized: Well developed, obese female in no acute distress  Head: normocephalic and atraumatic,. Oropharynx benign  Neck: Supple, no carotid bruits  Cardiac: Regular rate rhythm, no murmur  Musculoskeletal: No deformity   Neurological examination   Mentation: Alert oriented to time, place, history taking. Attention span and concentration appropriate. Recent and remote memory intact. Follows all commands speech and language fluent. Cranial nerves Pupils were equal round reactive to light extraocular movements were full, visual field were full on confrontational test. Facial sensation and strength were normal. hearing was intact to finger rubbing bilaterally. Uvula tongue midline. head turning and shoulder shrug were normal and symmetric.Tongue protrusion into cheek strength was normal. Motor: normal bulk and tone, full strength in the BUE, BLE, fine finger movements normal, no pronator drift. No focal weakness Sensory: normal and symmetric to light touch, pinprick, and Vibration,  proprioception  Coordination: finger-nose-finger, heel-to-shin bilaterally, no dysmetria Reflexes: Brachioradialis 2/2, biceps 2/2, triceps 2/2, patellar 2/2, Achilles 2/2, plantar responses were flexor bilaterally. Gait and Station: Rising up  from seated position without assistance, normal stance, moderate stride, good arm swing, smooth turning, able to perform tiptoe, and heel walking without difficulty. Tandem gait is unsteady. Romberg is negative, no assistive device   DIAGNOSTIC DATA (LABS, IMAGING, TESTING) - I reviewed patient records, labs, notes, testing and imaging myself where available.  Lab Results  Component Value Date   WBC 7.3 09/13/2014   HGB 12.9 09/13/2014   HCT 38.7 09/13/2014   MCV 93.7 09/13/2014   PLT 205 09/13/2014      Component Value Date/Time   NA 136 09/13/2014 0232   K 3.7 09/13/2014 0232   CL 102 09/13/2014 0232   CO2 23 09/13/2014 0232   GLUCOSE 90 09/13/2014 0232   BUN 16 09/13/2014 0232   CREATININE 0.78 09/13/2014 0232   CALCIUM 9.5 09/13/2014 0232   PROT 7.0 04/08/2012 1100   ALBUMIN 3.9 04/08/2012 1100   AST 33 04/08/2012 1100   ALT 59* 04/08/2012 1100   ALKPHOS 49 04/08/2012 1100   BILITOT 0.3 04/08/2012 1100   GFRNONAA 85* 09/13/2014 0232   GFRAA >90 09/13/2014 0232     ASSESSMENT AND PLAN  69 y.o. year old female  has a past medical history of Asthma;  Hypertension; Obesity; Headache(784.0); Sleep apnea; ; Elevated LFTs; Renal cell carcinoma ([place]); and TIA (transient ischemic attack) (09/11/2014). here to follow-up .    Continue baby aspirin for secondary stroke prevention Blood pressure in excellent control today 111/70 Low intensity exercise for overall health and well-being Continue CPAP for obstructive sleep apnea Follow-up in 6-8 months next with [place], [facility], [facility], APRN  [facility] [address], [place], [place] [address] [phone]

## 2015-06-06 NOTE — Progress Notes (Signed)
I agree with the assessment and plan as directed by NP .The patient is known to me .   Lillia Lengel, MD  

## 2015-06-09 DIAGNOSIS — Z0000 Encounter for general adult medical examination without abnormal findings: Secondary | ICD-10-CM | POA: Diagnosis not present

## 2015-06-09 DIAGNOSIS — I10 Essential (primary) hypertension: Secondary | ICD-10-CM | POA: Diagnosis not present

## 2015-06-09 DIAGNOSIS — K219 Gastro-esophageal reflux disease without esophagitis: Secondary | ICD-10-CM | POA: Diagnosis not present

## 2015-06-09 DIAGNOSIS — R7989 Other specified abnormal findings of blood chemistry: Secondary | ICD-10-CM | POA: Diagnosis not present

## 2015-06-16 ENCOUNTER — Other Ambulatory Visit: Payer: Self-pay | Admitting: Internal Medicine

## 2015-08-09 DIAGNOSIS — I10 Essential (primary) hypertension: Secondary | ICD-10-CM | POA: Diagnosis not present

## 2015-08-09 DIAGNOSIS — R0789 Other chest pain: Secondary | ICD-10-CM | POA: Diagnosis not present

## 2015-08-16 ENCOUNTER — Encounter (HOSPITAL_COMMUNITY): Payer: Self-pay | Admitting: Emergency Medicine

## 2015-08-16 ENCOUNTER — Emergency Department (HOSPITAL_COMMUNITY): Payer: Medicare Other

## 2015-08-16 ENCOUNTER — Emergency Department (HOSPITAL_COMMUNITY)
Admission: EM | Admit: 2015-08-16 | Discharge: 2015-08-17 | Disposition: A | Discharge status: Home / Self Care | Payer: Medicare Other | Attending: Emergency Medicine | Admitting: Emergency Medicine

## 2015-08-16 DIAGNOSIS — Z7951 Long term (current) use of inhaled steroids: Secondary | ICD-10-CM | POA: Insufficient documentation

## 2015-08-16 DIAGNOSIS — Z85528 Personal history of other malignant neoplasm of kidney: Secondary | ICD-10-CM | POA: Insufficient documentation

## 2015-08-16 DIAGNOSIS — Z8701 Personal history of pneumonia (recurrent): Secondary | ICD-10-CM | POA: Insufficient documentation

## 2015-08-16 DIAGNOSIS — G43809 Other migraine, not intractable, without status migrainosus: Secondary | ICD-10-CM

## 2015-08-16 DIAGNOSIS — I10 Essential (primary) hypertension: Secondary | ICD-10-CM | POA: Insufficient documentation

## 2015-08-16 DIAGNOSIS — G43919 Migraine, unspecified, intractable, without status migrainosus: Secondary | ICD-10-CM | POA: Diagnosis not present

## 2015-08-16 DIAGNOSIS — F1110 Opioid abuse, uncomplicated: Secondary | ICD-10-CM | POA: Insufficient documentation

## 2015-08-16 DIAGNOSIS — Z91040 Latex allergy status: Secondary | ICD-10-CM | POA: Insufficient documentation

## 2015-08-16 DIAGNOSIS — Z8673 Personal history of transient ischemic attack (TIA), and cerebral infarction without residual deficits: Secondary | ICD-10-CM | POA: Insufficient documentation

## 2015-08-16 DIAGNOSIS — Z8669 Personal history of other diseases of the nervous system and sense organs: Secondary | ICD-10-CM | POA: Diagnosis not present

## 2015-08-16 DIAGNOSIS — E669 Obesity, unspecified: Secondary | ICD-10-CM | POA: Diagnosis not present

## 2015-08-16 DIAGNOSIS — Z7982 Long term (current) use of aspirin: Secondary | ICD-10-CM | POA: Insufficient documentation

## 2015-08-16 DIAGNOSIS — F1310 Sedative, hypnotic or anxiolytic abuse, uncomplicated: Secondary | ICD-10-CM | POA: Insufficient documentation

## 2015-08-16 DIAGNOSIS — Z872 Personal history of diseases of the skin and subcutaneous tissue: Secondary | ICD-10-CM | POA: Diagnosis not present

## 2015-08-16 DIAGNOSIS — F419 Anxiety disorder, unspecified: Secondary | ICD-10-CM | POA: Diagnosis not present

## 2015-08-16 DIAGNOSIS — K219 Gastro-esophageal reflux disease without esophagitis: Secondary | ICD-10-CM | POA: Insufficient documentation

## 2015-08-16 DIAGNOSIS — J45909 Unspecified asthma, uncomplicated: Secondary | ICD-10-CM | POA: Diagnosis not present

## 2015-08-16 DIAGNOSIS — Z79899 Other long term (current) drug therapy: Secondary | ICD-10-CM | POA: Insufficient documentation

## 2015-08-16 DIAGNOSIS — Z86010 Personal history of colonic polyps: Secondary | ICD-10-CM | POA: Insufficient documentation

## 2015-08-16 DIAGNOSIS — R51 Headache: Secondary | ICD-10-CM | POA: Diagnosis not present

## 2015-08-16 LAB — I-STAT CHEM 8, ED
BUN: 21 mg/dL — AB (ref 6–20)
CREATININE: 1 mg/dL (ref 0.44–1.00)
Calcium, Ion: 1.26 mmol/L (ref 1.13–1.30)
Chloride: 106 mmol/L (ref 101–111)
GLUCOSE: 116 mg/dL — AB (ref 65–99)
HEMATOCRIT: 37 % (ref 36.0–46.0)
HEMOGLOBIN: 12.6 g/dL (ref 12.0–15.0)
POTASSIUM: 4.2 mmol/L (ref 3.5–5.1)
Sodium: 139 mmol/L (ref 135–145)
TCO2: 21 mmol/L (ref 0–100)

## 2015-08-16 LAB — PROTIME-INR
INR: 1.1 (ref 0.00–1.49)
Prothrombin Time: 14.4 seconds (ref 11.6–15.2)

## 2015-08-16 LAB — COMPREHENSIVE METABOLIC PANEL
ALK PHOS: 52 U/L (ref 38–126)
ALT: 74 U/L — AB (ref 14–54)
AST: 41 U/L (ref 15–41)
Albumin: 3.6 g/dL (ref 3.5–5.0)
Anion gap: 11 (ref 5–15)
BILIRUBIN TOTAL: 0.5 mg/dL (ref 0.3–1.2)
BUN: 18 mg/dL (ref 6–20)
CALCIUM: 9.8 mg/dL (ref 8.9–10.3)
CHLORIDE: 106 mmol/L (ref 101–111)
CO2: 22 mmol/L (ref 22–32)
CREATININE: 1.1 mg/dL — AB (ref 0.44–1.00)
GFR calc Af Amer: 59 mL/min — ABNORMAL LOW (ref >60)
GFR, EST NON AFRICAN AMERICAN: 51 mL/min — AB (ref >60)
Glucose, Bld: 117 mg/dL — ABNORMAL HIGH (ref 65–99)
Potassium: 4.2 mmol/L (ref 3.5–5.1)
Sodium: 139 mmol/L (ref 135–145)
Total Protein: 6.2 g/dL — ABNORMAL LOW (ref 6.5–8.1)

## 2015-08-16 LAB — DIFFERENTIAL
BASOS ABS: 0 10*3/uL (ref 0.0–0.1)
Basophils Relative: 0 %
Eosinophils Absolute: 0.1 10*3/uL (ref 0.0–0.7)
Eosinophils Relative: 2 %
LYMPHS ABS: 1.2 10*3/uL (ref 0.7–4.0)
LYMPHS PCT: 14 %
MONO ABS: 0.5 10*3/uL (ref 0.1–1.0)
MONOS PCT: 6 %
NEUTROS ABS: 6.5 10*3/uL (ref 1.7–7.7)
Neutrophils Relative %: 78 %

## 2015-08-16 LAB — CBC
HEMATOCRIT: 36.4 % (ref 36.0–46.0)
HEMOGLOBIN: 12.1 g/dL (ref 12.0–15.0)
MCH: 31.2 pg (ref 26.0–34.0)
MCHC: 33.2 g/dL (ref 30.0–36.0)
MCV: 93.8 fL (ref 78.0–100.0)
Platelets: 189 10*3/uL (ref 150–400)
RBC: 3.88 MIL/uL (ref 3.87–5.11)
RDW: 12.9 % (ref 11.5–15.5)
WBC: 8.2 10*3/uL (ref 4.0–10.5)

## 2015-08-16 LAB — APTT: APTT: 28 s (ref 24–37)

## 2015-08-16 LAB — I-STAT TROPONIN, ED: TROPONIN I, POC: 0 ng/mL (ref 0.00–0.08)

## 2015-08-16 LAB — SEDIMENTATION RATE: Sed Rate: 32 mm/hr — ABNORMAL HIGH (ref 0–22)

## 2015-08-16 MED ORDER — DIPHENHYDRAMINE HCL 50 MG/ML IJ SOLN
INTRAMUSCULAR | Status: AC
  Administered 2015-08-16: 12.5 mg via INTRAVENOUS
  Filled 2015-08-16: qty 1

## 2015-08-16 MED ORDER — FAMOTIDINE IN NACL 20-0.9 MG/50ML-% IV SOLN
20.0000 mg | Freq: Once | INTRAVENOUS | Status: AC
  Administered 2015-08-16: 20 mg via INTRAVENOUS
  Filled 2015-08-16: qty 50

## 2015-08-16 MED ORDER — DIPHENHYDRAMINE HCL 50 MG/ML IJ SOLN
12.5000 mg | Freq: Once | INTRAMUSCULAR | Status: AC
  Administered 2015-08-16: 12.5 mg via INTRAVENOUS

## 2015-08-16 MED ORDER — MORPHINE SULFATE (PF) 4 MG/ML IV SOLN
4.0000 mg | Freq: Once | INTRAVENOUS | Status: AC
  Administered 2015-08-16: 4 mg via INTRAVENOUS
  Filled 2015-08-16: qty 1

## 2015-08-16 MED ORDER — ONDANSETRON HCL 4 MG/2ML IJ SOLN
4.0000 mg | Freq: Once | INTRAMUSCULAR | Status: AC
  Administered 2015-08-16: 4 mg via INTRAVENOUS
  Filled 2015-08-16: qty 2

## 2015-08-16 MED ORDER — DEXAMETHASONE SODIUM PHOSPHATE 10 MG/ML IJ SOLN
10.0000 mg | Freq: Once | INTRAMUSCULAR | Status: AC
  Administered 2015-08-16: 10 mg via INTRAVENOUS
  Filled 2015-08-16: qty 1

## 2015-08-16 MED ORDER — LORAZEPAM 2 MG/ML IJ SOLN
1.0000 mg | Freq: Once | INTRAMUSCULAR | Status: AC
  Administered 2015-08-17: 1 mg via INTRAVENOUS
  Filled 2015-08-16: qty 1

## 2015-08-16 NOTE — ED Provider Notes (Signed)
CSN: [id number]     Arrival date & time 08/16/15  2025 History   First MD Initiated Contact with Patient 08/16/15 2030     Chief Complaint  Patient presents with  . Headache     (Consider location/radiation/quality/duration/timing/severity/associated sxs/prior Treatment) HPI Comments: Patient presents with husband for evaluation of visual changes and sharp, sudden onset headache earlier this evening associated with nausea. No vomiting or fever. She reports the pain is right temporal area and causes photophobia. She reports visual changes around 3:00 pm described and skewed central vision bilaterally that lasted about 15 minutes, and presented without headache. Around 5:15 while talking on the phone, at rest, she had recurrent similar visual changes but was associated with a sudden onset, severe, right temporal headache. She took ibuprofen 800 mg and laid down to rest. She reports resolution of visual change and mild improvement in headache until she got up and became active, at which time the headache became severe again prompting ED evaluation. She states she has had similar symptoms in the past - both headache and visual changes - that were diagnosed by her neurologist as TIA (Dr. Brett Fairy).   Patient is a 70 y.o. female presenting with headaches. The history is provided by the patient and the spouse. No language interpreter was used.  Headache Pain location:  R temporal Quality:  Sharp Radiates to:  Does not radiate Onset quality:  Sudden Duration:  4 hours Timing:  Constant Similar to prior headaches: yes   Relieved by:  Nothing Worsened by:  Light and activity Ineffective treatments:  NSAIDs Associated symptoms: nausea and vomiting   Associated symptoms: no congestion, no fever, no myalgias, no neck stiffness and no weakness     Past Medical History  Diagnosis Date  . Asthma   . GERD (gastroesophageal reflux disease)   . Hypertension   . Obesity   . Allergic rhinitis   . IBS  (irritable bowel syndrome)   . Fatty liver disease, nonalcoholic   . Adenomatous colon polyp   . Ulcer   . Dysrhythmia 1999    Dr Johnsie Cancel 2003  . Anxiety   . Pneumonia 2007  . Headache(784.0)     migraines with vision changes but no pain  . Heart murmur   . Cancer of kidney ([place])   . Sleep apnea     moderate  . Diverticulosis   . Elevated LFTs   . Renal cell carcinoma ([place])   . Colon polyps     adenomatous  . TIA (transient ischemic attack) 09/11/2014   Past Surgical History  Procedure Laterality Date  . Abdominal hysterectomy  1979  . Tonsillectomy    . Breast reduction surgery  Jan 1991  . Kidney surgery Right October 2013     Tumor removed   . Elbow surgery Left    Family History  Problem Relation Age of Onset  . Heart disease Father 85  . Lung cancer Father   . Liver disease Neg Hx   . Heart disease Brother   . Asthma Neg Hx   . Hypertension Other   . Colon cancer Cousin   . Allergies Daughter   . Allergies Brother   . Melanoma Daughter    Social History  Substance Use Topics  . Smoking status: Never Smoker   . Smokeless tobacco: Never Used  . Alcohol Use: 3.6 oz/week    6 Glasses of wine per week     Comment: daily>>>counselled to reduce ETOH use as of 06-23-13, has  1-2 glasses wine daily 4-5 days week   OB History    No data available     Review of Systems  Constitutional: Negative for fever and chills.  HENT: Negative for congestion, facial swelling and trouble swallowing.   Eyes: Positive for visual disturbance.  Respiratory: Negative.  Negative for shortness of breath.   Cardiovascular: Negative.  Negative for chest pain.  Gastrointestinal: Positive for nausea and vomiting.  Musculoskeletal: Negative.  Negative for myalgias and neck stiffness.  Skin: Negative.   Neurological: Positive for headaches. Negative for syncope, speech difficulty and weakness.      Allergies  Aspirin; Diflucan; Latex; and Moxifloxacin  Home Medications   Prior  to Admission medications   Medication Sig Start Date End Date Taking? Authorizing Provider  aspirin EC 81 MG tablet Take 1 tablet (81 mg total) by mouth daily. Patient taking differently: Take 81 mg by mouth at bedtime.  09/16/14  Yes Carmen Dohmeier, MD  BIOTIN PO Take 1 capsule by mouth at bedtime.    Yes Historical Provider, MD  clidinium-chlordiazePOXIDE (LIBRAX) 5-2.5 MG per capsule TAKE 1 CAPSULE TWICE A DAY Patient taking differently: Take 2 capsules by mouth daily at bedtime 03/24/15  Yes Irene Shipper, MD  clonazePAM (KLONOPIN) 1 MG tablet Take 1 mg by mouth at bedtime.    Yes Historical Provider, MD  diphenhydrAMINE (BENADRYL) 25 MG tablet Take 25 mg by mouth every 8 (eight) hours as needed for allergies.    Yes Historical Provider, MD  fluticasone (FLONASE) 50 MCG/ACT nasal spray Place 2 sprays into the nose daily as needed for rhinitis or allergies.   Yes Historical Provider, MD  LINZESS 145 MCG CAPS capsule TAKE 1 CAPSULE DAILY 06/16/15  Yes Irene Shipper, MD  metaxalone (SKELAXIN) 800 MG tablet Take 800 mg by mouth at bedtime.   Yes Historical Provider, MD  metoprolol succinate (TOPROL-XL) 100 MG 24 hr tablet Take 100 mg by mouth every evening. Take with or immediately following a meal.   Yes Historical Provider, MD  MILK THISTLE PO Take 1 capsule by mouth at bedtime.    Yes Historical Provider, MD  Multiple Vitamin (MULTIVITAMIN WITH MINERALS) TABS Take 1 tablet by mouth every evening.    Yes Historical Provider, MD  NEXIUM 40 MG capsule TAKE 1 CAPSULE TWICE A DAY Patient taking differently: Take 40 mg by mouth twice daily. 05/22/15  Yes Irene Shipper, MD  olmesartan (BENICAR) 40 MG tablet Take 40 mg by mouth every evening.   Yes Historical Provider, MD  spironolactone (ALDACTONE) 50 MG tablet Take 50 mg by mouth every morning.   Yes Historical Provider, MD  UNABLE TO FIND Med Name: probiotic once daily (10 billion units)   Yes Historical Provider, MD   BP 126/71 mmHg  Pulse 74   Temp(Src) 98.9 F (37.2 C) (Oral)  Resp 17  Ht 5' 2"  (1.575 m)  Wt 99.791 kg  BMI 40.23 kg/m2  SpO2 96% Physical Exam  Constitutional: She is oriented to person, place, and time. She appears well-developed and well-nourished. No distress.  HENT:  Head: Normocephalic and atraumatic.  Eyes: Conjunctivae are normal.  Neck: Normal range of motion. Neck supple.  Cardiovascular: Normal rate.   No murmur heard. Pulmonary/Chest: Effort normal. She has no wheezes. She has no rales.  Abdominal: Soft. There is no tenderness.  Musculoskeletal: Normal range of motion.  Neurological: She is alert and oriented to person, place, and time.  Skin: Skin is warm and dry.  ED Course  Procedures (including critical care time) Labs Review Labs Reviewed  ETHANOL  PROTIME-INR  APTT  CBC  DIFFERENTIAL  COMPREHENSIVE METABOLIC PANEL  URINE RAPID DRUG SCREEN, HOSP PERFORMED  URINALYSIS, ROUTINE W REFLEX MICROSCOPIC (NOT AT [facility])  SEDIMENTATION RATE  I-STAT CHEM 8, ED  I-STAT TROPOININ, ED   Results for orders placed or performed during the hospital encounter of 08/16/15  Protime-INR  Result Value Ref Range   Prothrombin Time 14.4 11.6 - 15.2 seconds   INR 1.10 0.00 - 1.49  APTT  Result Value Ref Range   aPTT 28 24 - 37 seconds  CBC  Result Value Ref Range   WBC 8.2 4.0 - 10.5 K/uL   RBC 3.88 3.87 - 5.11 MIL/uL   Hemoglobin 12.1 12.0 - 15.0 g/dL   HCT 36.4 36.0 - 46.0 %   MCV 93.8 78.0 - 100.0 fL   MCH 31.2 26.0 - 34.0 pg   MCHC 33.2 30.0 - 36.0 g/dL   RDW 12.9 11.5 - 15.5 %   Platelets 189 150 - 400 K/uL  Differential  Result Value Ref Range   Neutrophils Relative % 78 %   Neutro Abs 6.5 1.7 - 7.7 K/uL   Lymphocytes Relative 14 %   Lymphs Abs 1.2 0.7 - 4.0 K/uL   Monocytes Relative 6 %   Monocytes Absolute 0.5 0.1 - 1.0 K/uL   Eosinophils Relative 2 %   Eosinophils Absolute 0.1 0.0 - 0.7 K/uL   Basophils Relative 0 %   Basophils Absolute 0.0 0.0 - 0.1 K/uL  Comprehensive  metabolic panel  Result Value Ref Range   Sodium 139 135 - 145 mmol/L   Potassium 4.2 3.5 - 5.1 mmol/L   Chloride 106 101 - 111 mmol/L   CO2 22 22 - 32 mmol/L   Glucose, Bld 117 (H) 65 - 99 mg/dL   BUN 18 6 - 20 mg/dL   Creatinine, Ser 1.10 (H) 0.44 - 1.00 mg/dL   Calcium 9.8 8.9 - 10.3 mg/dL   Total Protein 6.2 (L) 6.5 - 8.1 g/dL   Albumin 3.6 3.5 - 5.0 g/dL   AST 41 15 - 41 U/L   ALT 74 (H) 14 - 54 U/L   Alkaline Phosphatase 52 38 - 126 U/L   Total Bilirubin 0.5 0.3 - 1.2 mg/dL   GFR calc non Af Amer 51 (L) >60 mL/min   GFR calc Af Amer 59 (L) >60 mL/min   Anion gap 11 5 - 15  I-Stat Chem 8, ED  (not at [facility], [facility])  Result Value Ref Range   Sodium 139 135 - 145 mmol/L   Potassium 4.2 3.5 - 5.1 mmol/L   Chloride 106 101 - 111 mmol/L   BUN 21 (H) 6 - 20 mg/dL   Creatinine, Ser 1.00 0.44 - 1.00 mg/dL   Glucose, Bld 116 (H) 65 - 99 mg/dL   Calcium, Ion 1.26 1.13 - 1.30 mmol/L   TCO2 21 0 - 100 mmol/L   Hemoglobin 12.6 12.0 - 15.0 g/dL   HCT 37.0 36.0 - 46.0 %  I-stat troponin, ED (not at [facility], [facility])  Result Value Ref Range   Troponin i, poc 0.00 0.00 - 0.08 ng/mL   Comment 3            Imaging Review No results found. I have personally reviewed and evaluated these images and lab results as part of my medical decision-making.   EKG Interpretation None      MDM  Final diagnoses:  None    1. migraine  Chart reviewed: Patient with same presentation of sudden onset headache, same visual changes, in 2014, followed up in the office of [facility] by Dr. Janann Colonel. She was diagnosed at that time with migraine and notes referred to negative MRI brain. Per patient, interim follow up by Dr. Brett Fairy provided a concern for TIA over migraine.   Patient given morphine for pain and subsequently has redness and itching in arm where administered. Benadryl and Pepcid provided with improvement. No hypoxia, stridor. Consider mild reaction - cautioned regarding listing morphine as  allergy from now on.  11:10 - Discussed patient's condition and negative CT tonight with neurohospitalist (Dr. Silverio Decamp) who recommends MR brain tonight, if negative, discharge home is appropriate. Patient and husband updated on plan of care. She reports headache is improved, nausea resolved.   2:15 - MR brain negative for acute changes. Patient reports headache is resolved. She appears much more comfortable. VSS. Will Rx fioricet for pain if headache recurs. She is encouraged to follow up with Dr. Brett Fairy for further management.   Charlann Lange, PA-C 08/17/15 1572  Ezequiel Essex, MD 08/17/15 1447

## 2015-08-16 NOTE — ED Notes (Signed)
Mila Homer., PA made aware of PT's complaints and neuro exam

## 2015-08-16 NOTE — ED Notes (Signed)
ED PA at bedside

## 2015-08-16 NOTE — ED Notes (Signed)
PT's breathing becomes faster and slightly labored. Nehemiah Settle, [place] made aware.

## 2015-08-16 NOTE — ED Notes (Signed)
Redness and swelling along vein on left forearm noted with surrounding redness. PT reports itching and burning. Nehemiah Settle, [place] made aware.

## 2015-08-16 NOTE — ED Notes (Signed)
Transported to CT at this time via stretcher

## 2015-08-16 NOTE — ED Notes (Signed)
PT reports a "vision issue" that occurred at 1300 for 15-20 minutes PT describes vision issue as "jig-sawy and all messed up." PT had another vision episode at 1715 with "exploding pain" behind the right eye and in right temple. PT reports her eyes started watering and she laid down and applied her CPAP. PT took 842m of ibuprofen. Pain eased for 10 minutes and then returned. PT reports nausea with this episode and states, "Bad pain always upsets my stomach."

## 2015-08-16 NOTE — ED Notes (Signed)
PT returns from CT at this time.

## 2015-08-17 ENCOUNTER — Emergency Department (HOSPITAL_COMMUNITY): Payer: Medicare Other

## 2015-08-17 DIAGNOSIS — G43809 Other migraine, not intractable, without status migrainosus: Secondary | ICD-10-CM | POA: Diagnosis not present

## 2015-08-17 DIAGNOSIS — R51 Headache: Secondary | ICD-10-CM | POA: Diagnosis not present

## 2015-08-17 LAB — RAPID URINE DRUG SCREEN, HOSP PERFORMED
AMPHETAMINES: NOT DETECTED
BENZODIAZEPINES: POSITIVE — AB
Barbiturates: NOT DETECTED
Cocaine: NOT DETECTED
OPIATES: POSITIVE — AB
TETRAHYDROCANNABINOL: NOT DETECTED

## 2015-08-17 LAB — URINALYSIS, ROUTINE W REFLEX MICROSCOPIC
BILIRUBIN URINE: NEGATIVE
GLUCOSE, UA: NEGATIVE mg/dL
HGB URINE DIPSTICK: NEGATIVE
KETONES UR: NEGATIVE mg/dL
Leukocytes, UA: NEGATIVE
NITRITE: NEGATIVE
PH: 5 (ref 5.0–8.0)
Protein, ur: NEGATIVE mg/dL
Specific Gravity, Urine: 1.014 (ref 1.005–1.030)

## 2015-08-17 LAB — ETHANOL: Alcohol, Ethyl (B): <5 mg/dL (ref <5)

## 2015-08-17 MED ORDER — BUTALBITAL-APAP-CAFFEINE 50-325-40 MG PO TABS: 1.0000 | ORAL_TABLET | Freq: Four times a day (QID) | ORAL | Status: AC | PRN

## 2015-08-17 MED ORDER — ONDANSETRON HCL 4 MG PO TABS: 4.0000 mg | ORAL_TABLET | Freq: Four times a day (QID) | ORAL | Status: DC

## 2015-08-17 NOTE — ED Notes (Signed)
Pt up to restroom.  Gait steady and even, pt able to ambulate independently

## 2015-08-17 NOTE — ED Notes (Signed)
Patient able to dress and ambulate independently

## 2015-08-17 NOTE — Discharge Instructions (Signed)

## 2015-08-17 NOTE — ED Notes (Signed)
Pt back from MRI 

## 2015-09-11 ENCOUNTER — Ambulatory Visit: Payer: Medicare Other | Admitting: Neurology

## 2015-09-11 NOTE — Progress Notes (Signed)
Patient ID: Jamie [name], female   DOB: [birth year], 70 y.o.   MRN: [id number]     Cardiology Office Note   Date:  09/18/2015   ID:  Jamie [name], DOB Nov 10, 1945, MRN [id number]  PCP:  Horatio Pel, MD  Cardiologist:   Jenkins Rouge, MD   Chief Complaint  Patient presents with  . Establish Care    NP: atypical chest pain      History of Present Illness: Jamie [name] is a 70 y.o. female who presents for evaluation of atypical chest pain   Seen in ER 08/16/15 for headache and visual changes MRI negative  Echo 2013 reviewed No valve disease EF 55-60% mild to moderate LAE  She sees neurology for occular migraines and vertigo.  Seems frustrated by symptoms that sometimes turn into "TIA: with some difficulty speaking  3 episodes of chest pain last year Most recent December.  Sharp. Non exertional radiates to jaw and right arm.  Lasts minutes Not associated with headaches/mirgrains.  No palpitations or history of PAF  Only takes baby aspirin as higher dose causes "ulcers"     Past Medical History  Diagnosis Date  . Asthma   . GERD (gastroesophageal reflux disease)   . Hypertension   . Obesity   . Allergic rhinitis   . IBS (irritable bowel syndrome)   . Fatty liver disease, nonalcoholic   . Adenomatous colon polyp   . Ulcer   . Dysrhythmia 1999    Dr Johnsie Cancel 2003  . Anxiety   . Pneumonia 2007  . Headache(784.0)     migraines with vision changes but no pain  . Heart murmur   . Cancer of kidney ([place])   . Sleep apnea     moderate  . Diverticulosis   . Elevated LFTs   . Renal cell carcinoma ([place])   . Colon polyps     adenomatous  . TIA (transient ischemic attack) 09/11/2014    Past Surgical History  Procedure Laterality Date  . Abdominal hysterectomy  1979  . Tonsillectomy    . Breast reduction surgery  Jan 1991  . Kidney surgery Right October 2013     Tumor removed   . Elbow surgery Left      Current Outpatient Prescriptions  Medication Sig  Dispense Refill  . aspirin EC 81 MG tablet Take 1 tablet (81 mg total) by mouth daily. 90 tablet 3  . BIOTIN PO Take 1 capsule by mouth at bedtime.     . butalbital-acetaminophen-caffeine (FIORICET) 50-325-40 MG tablet Take 1-2 tablets by mouth every 6 (six) hours as needed for headache. 20 tablet 0  . clidinium-chlordiazePOXIDE (LIBRAX) 5-2.5 MG capsule Take 1 capsule by mouth 2 (two) times daily.    . clonazePAM (KLONOPIN) 1 MG tablet Take 1 mg by mouth at bedtime.     . diphenhydrAMINE (BENADRYL) 25 MG tablet Take 25 mg by mouth every 8 (eight) hours as needed for allergies.     Marland Kitchen esomeprazole (NEXIUM) 40 MG capsule Take 40 mg by mouth 2 (two) times daily before a meal.    . fluticasone (FLONASE) 50 MCG/ACT nasal spray Place 2 sprays into the nose daily as needed for rhinitis or allergies.    . Linaclotide (LINZESS) 145 MCG CAPS capsule Take 145 mcg by mouth daily.    . metaxalone (SKELAXIN) 800 MG tablet Take 800 mg by mouth at bedtime.    . metoprolol succinate (TOPROL-XL) 100 MG 24 hr tablet Take 100 mg by mouth  every evening. Take with or immediately following a meal.    . MILK THISTLE PO Take 1 capsule by mouth at bedtime.     . Multiple Vitamin (MULTIVITAMIN WITH MINERALS) TABS Take 1 tablet by mouth every evening.     . olmesartan (BENICAR) 40 MG tablet Take 40 mg by mouth every evening.    . ondansetron (ZOFRAN) 4 MG tablet Take 1 tablet (4 mg total) by mouth every 6 (six) hours. 12 tablet 0  . spironolactone (ALDACTONE) 50 MG tablet Take 50 mg by mouth every morning.    Marland Kitchen UNABLE TO FIND Take 1 tablet by mouth daily. Med Name: probiotic once daily (10 billion units)     Current Facility-Administered Medications  Medication Dose Route Frequency Provider Last Rate Last Dose  . methylPREDNISolone acetate (DEPO-MEDROL) injection 80 mg  80 mg Intra-articular Once Aleksei Plotnikov V, MD        Allergies:   Morphine and related; Aspirin; Diflucan; Latex; and Moxifloxacin    Social  History:  The patient  reports that she has never smoked. She has never used smokeless tobacco. She reports that she drinks about 3.6 oz of alcohol per week. She reports that she does not use illicit drugs.   Family History:  The patient's family history includes Allergies in her brother and daughter; Colon cancer in her cousin; Heart disease in her brother; Heart disease (age of onset: 77) in her father; Hypertension in her other; Lung cancer in her father; Melanoma in her daughter. There is no history of Liver disease or Asthma.    ROS:  Please see the history of present illness.   Otherwise, review of systems are positive for none.   All other systems are reviewed and negative.    PHYSICAL EXAM: VS:  BP 100/62 mmHg  Pulse 62  Ht 5' 2"  (1.575 m)  Wt 102.422 kg (225 lb 12.8 oz)  BMI 41.29 kg/m2  SpO2 95% , BMI Body mass index is 41.29 kg/(m^2). Affect appropriate Overweight white female  HEENT: normal Neck supple with no adenopathy JVP normal no bruits no thyromegaly Lungs clear with no wheezing and good diaphragmatic motion Heart:  S1/S2 no murmur, no rub, gallop or click PMI normal Abdomen: benighn, BS positve, no tenderness, no AAA no bruit.  No HSM or HJR Distal pulses intact with no bruits No edema Neuro non-focal Skin warm and dry No muscular weakness    EKG:   08/18/15  SR j point elevation inferior leads Q in lead 3    Recent Labs: 08/16/2015: ALT 74*; BUN 18; BUN 21*; Creatinine, Ser 1.10*; Creatinine, Ser 1.00; Hemoglobin 12.1; Hemoglobin 12.6; Platelets 189; Potassium 4.2; Potassium 4.2; Sodium 139; Sodium 139    Lipid Panel    Component Value Date/Time   CHOL 168 08/24/2010 1134   TRIG 85.0 08/24/2010 1134   HDL 65.40 08/24/2010 1134   CHOLHDL 3 08/24/2010 1134   VLDL 17.0 08/24/2010 1134   LDLCALC 86 08/24/2010 1134      Wt Readings from Last 3 Encounters:  09/18/15 102.422 kg (225 lb 12.8 oz)  08/16/15 99.791 kg (220 lb)  06/05/15 102.604 kg (226 lb  3.2 oz)      Other studies Reviewed: Additional studies/ records that were reviewed today include: Primary records Dr Shelia Media ER records from January see HPI.    ASSESSMENT AND PLAN:  1. Chest pain atypical Recurrent  F/u lexiscan myovue  CXR to screen mediastinum since some pain radiates to shoulder blades 2.  Migraine:  F/u neuro echo with bubble study r/o PFO   3. TIA  MRI / MRA ok  Consider changing to plavix  F/u  Neuro   4. HTN:  Well controlled.  Continue current medications and low sodium Dash type diet.      Current medicines are reviewed at length with the patient today.  The patient does not have concerns regarding medicines.  The following changes have been made:  no change  Labs/ tests ordered today include:  CXR, lexiscan myovue and echo with bubble study   No orders of the defined types were placed in this encounter.     Disposition:   FU with me PRN      Signed, Jenkins Rouge, MD  09/18/2015 2:58 PM    [address], [place], [place]  [address] Phone: [phone]; Fax: [phone]

## 2015-09-15 ENCOUNTER — Encounter: Payer: Self-pay | Admitting: Cardiovascular Disease

## 2015-09-18 ENCOUNTER — Ambulatory Visit (INDEPENDENT_AMBULATORY_CARE_PROVIDER_SITE_OTHER): Payer: Medicare Other | Admitting: Cardiovascular Disease

## 2015-09-18 ENCOUNTER — Encounter: Payer: Self-pay | Admitting: Cardiovascular Disease

## 2015-09-18 VITALS — BP 100/62 | HR 62 | Ht 62.0 in | Wt 225.8 lb

## 2015-09-18 DIAGNOSIS — G458 Other transient cerebral ischemic attacks and related syndromes: Secondary | ICD-10-CM

## 2015-09-18 DIAGNOSIS — R0789 Other chest pain: Secondary | ICD-10-CM

## 2015-09-18 DIAGNOSIS — Z7689 Persons encountering health services in other specified circumstances: Secondary | ICD-10-CM

## 2015-09-18 DIAGNOSIS — Z7189 Other specified counseling: Secondary | ICD-10-CM | POA: Diagnosis not present

## 2015-09-18 NOTE — Patient Instructions (Signed)
Medication Instructions:  Your physician recommends that you continue on your current medications as directed. Please refer to the Current Medication list given to you today.  Labwork: NONE  Testing/Procedures: A chest x-ray takes a picture of the organs and structures inside the chest, including the heart, lungs, and blood vessels. This test can show several things, including, whether the heart is enlarges; whether fluid is building up in the lungs; and whether pacemaker / defibrillator leads are still in place.  Your physician has requested that you have a lexiscan myoview. For further information please visit [email or web address]. Please follow instruction sheet, as given.  Your physician has requested that you have an echocardiogram. Echocardiography is a painless test that uses sound waves to create images of your heart. It provides your doctor with information about the size and shape of your heart and how well your heart's chambers and valves are working. This procedure takes approximately one hour. There are no restrictions for this procedure.    Follow-Up: Your physician wants you to follow-up as needed.   If you need a refill on your cardiac medications before your next appointment, please call your pharmacy.

## 2015-09-19 DIAGNOSIS — H8112 Benign paroxysmal vertigo, left ear: Secondary | ICD-10-CM | POA: Diagnosis not present

## 2015-09-20 ENCOUNTER — Ambulatory Visit
Admission: RE | Admit: 2015-09-20 | Discharge: 2015-09-20 | Disposition: A | Discharge status: Home / Self Care | Payer: Medicare Other | Source: Ambulatory Visit | Attending: Cardiovascular Disease | Admitting: Cardiovascular Disease

## 2015-09-20 DIAGNOSIS — R0789 Other chest pain: Secondary | ICD-10-CM

## 2015-09-20 DIAGNOSIS — R079 Chest pain, unspecified: Secondary | ICD-10-CM | POA: Diagnosis not present

## 2015-09-21 ENCOUNTER — Telehealth (HOSPITAL_COMMUNITY): Payer: Self-pay | Admitting: *Deleted

## 2015-09-21 NOTE — Telephone Encounter (Signed)
Patient given detailed instructions per Myocardial Perfusion Study Information Sheet for the test on 09/27/15 at 9:45. Patient notified to arrive 15 minutes early and that it is imperative to arrive on time for appointment to keep from having the test rescheduled.  If you need to cancel or reschedule your appointment, please call the office within 24 hours of your appointment. Failure to do so may result in a cancellation of your appointment, and a $50 no show fee. Patient verbalized understanding.[place]

## 2015-09-27 ENCOUNTER — Ambulatory Visit (HOSPITAL_COMMUNITY): Payer: Medicare Other | Attending: Cardiovascular Disease

## 2015-09-27 DIAGNOSIS — I10 Essential (primary) hypertension: Secondary | ICD-10-CM | POA: Diagnosis not present

## 2015-09-27 DIAGNOSIS — Z8673 Personal history of transient ischemic attack (TIA), and cerebral infarction without residual deficits: Secondary | ICD-10-CM | POA: Diagnosis not present

## 2015-09-27 DIAGNOSIS — Z8249 Family history of ischemic heart disease and other diseases of the circulatory system: Secondary | ICD-10-CM | POA: Diagnosis not present

## 2015-09-27 DIAGNOSIS — R0789 Other chest pain: Secondary | ICD-10-CM

## 2015-09-27 MED ORDER — REGADENOSON 0.4 MG/5ML IV SOLN
0.4000 mg | Freq: Once | INTRAVENOUS | Status: AC
  Administered 2015-09-27: 0.4 mg via INTRAVENOUS

## 2015-09-27 MED ORDER — TECHNETIUM TC 99M SESTAMIBI GENERIC - CARDIOLITE
32.9000 | Freq: Once | INTRAVENOUS | Status: AC | PRN
  Administered 2015-09-27: 32.9 via INTRAVENOUS

## 2015-10-02 ENCOUNTER — Ambulatory Visit (INDEPENDENT_AMBULATORY_CARE_PROVIDER_SITE_OTHER): Payer: Medicare Other | Admitting: Internal Medicine

## 2015-10-02 ENCOUNTER — Encounter: Payer: Self-pay | Admitting: Neurology

## 2015-10-02 ENCOUNTER — Encounter: Payer: Self-pay | Admitting: Internal Medicine

## 2015-10-02 ENCOUNTER — Ambulatory Visit (INDEPENDENT_AMBULATORY_CARE_PROVIDER_SITE_OTHER): Payer: Medicare Other | Admitting: Neurology

## 2015-10-02 VITALS — BP 102/74 | HR 82 | Resp 20 | Ht 62.0 in | Wt 226.0 lb

## 2015-10-02 VITALS — BP 126/60 | HR 72 | Ht 62.5 in | Wt 227.0 lb

## 2015-10-02 DIAGNOSIS — K5900 Constipation, unspecified: Secondary | ICD-10-CM | POA: Diagnosis not present

## 2015-10-02 DIAGNOSIS — K219 Gastro-esophageal reflux disease without esophagitis: Secondary | ICD-10-CM | POA: Diagnosis not present

## 2015-10-02 DIAGNOSIS — Z9189 Other specified personal risk factors, not elsewhere classified: Secondary | ICD-10-CM | POA: Diagnosis not present

## 2015-10-02 DIAGNOSIS — K589 Irritable bowel syndrome without diarrhea: Secondary | ICD-10-CM

## 2015-10-02 DIAGNOSIS — G43111 Migraine with aura, intractable, with status migrainosus: Secondary | ICD-10-CM | POA: Insufficient documentation

## 2015-10-02 DIAGNOSIS — Z86010 Personal history of colonic polyps: Secondary | ICD-10-CM

## 2015-10-02 MED ORDER — LINACLOTIDE 145 MCG PO CAPS: 145.0000 ug | ORAL_CAPSULE | Freq: Every day | ORAL | Status: DC

## 2015-10-02 MED ORDER — ONDANSETRON HCL 4 MG PO TABS
4.0000 mg | ORAL_TABLET | Freq: Four times a day (QID) | ORAL | Status: DC
Start: 2015-10-02 — End: 2016-11-08

## 2015-10-02 MED ORDER — ESOMEPRAZOLE MAGNESIUM 40 MG PO CPDR: 40.0000 mg | DELAYED_RELEASE_CAPSULE | Freq: Two times a day (BID) | ORAL | Status: DC

## 2015-10-02 MED ORDER — CILIDINIUM-CHLORDIAZEPOXIDE 2.5-5 MG PO CAPS: 1.0000 | ORAL_CAPSULE | Freq: Two times a day (BID) | ORAL | Status: DC

## 2015-10-02 NOTE — Addendum Note (Signed)
Addended by: Larey Seat on: 10/02/2015 03:25 PM   Modules accepted: Orders

## 2015-10-02 NOTE — Progress Notes (Signed)
[facility]  PATIENT: Jamie [name] DOB: 1945-10-27   REASON FOR VISIT: Follow-up for TIA, migraines, CPAP with Dr.Pharr HISTORY FROM: Patient    HISTORY OF PRESENT ILLNESS:HISTORY (CD)Is and is here today to be evaluated for a headache, and she reports that she first had migrainous headaches when she resumed birth control pills over 40 years ago. The headaches were very debilitating at the time but there were infrequent through the 1970s. Once she discontinued hormonal contraception her headaches became as infrequent as once a year. In the early 1980s the couple lived in [place] and Jamie [name] recalls that the bright light was a trigger for headaches -there was a strong photophobic component, and she made many trips to the local hospital for Demerol shots. She underwent a self hypnosis treatment and this worked well, controlled the migraine for years. On 9-20 02-2013 the patient woke up with a severe explosive kind of pain that seemed to arise from the right eye. The pain was centered retro-orbital and radiated backwards into the center of the head. The patient tried aspirin and Motrin with no benefit at the time as my colleague Dr. Janann Colonel D.O. did document.  This was different from her typical migraines. She was extremely nauseated but she could not vomit and developed dry heaves. The headache broker out of sound sleep at about about 3 AM. She had no tearing of the eye ( that she could remember), the nose was not runny and her face was not flushe.She was asleep with her CPAP.  The patient had an eye exam in 2013 which was reportedly normal at that time and her headaches were evaluated with a brain MRI which was also reviewed and found unremarkable. Not had a number of the severe headache spell since. On 09-12-14 the patient again developed a headache this time of a new quality. She was watching TV at about 11 PM she noticed that the right vision was fluctuating she states  that her vision was disturbed by changing in light and dark sensation, that she describedas if it checker board was placed in front of her eyes. By 11.45 , she tried to answer an e mail, couldn't decipher the message, non-sensical to her. She switched and tried to read aloud, she couldn't , her speech output was not correlated to the text she read. Jibberish, while her normal speech was unaffected. On the left side this time by migraines have always attack the right side of her head. This was unusual as well. The entire episode lasted less than one hour. The emergency room obtained a CT head , non contrast, read as normal and reviewed here in imaging file. Her BP was very high.  She sleeps with a CPAP, and likes it very much, she had nocturia before being diagnosed with OSA.  Her pneumonia related hospitalization in 2007 was the cause for the test by [facility], as her RN had witnessed her sleep apnea. Dr .Elsworth Soho follows her, Dr Constance Holster is her ENT.   UPDATE: Nov 28, 2014. CMMs. Jamie [name], 70 year old female returns for follow-up. She was initially evaluated by Dr.Regnald Bowens on 09/16/2014. She is now on aspirin enteric-coated and has not had further TIA events. She also has obstructive sleep apnea and uses CPAP every night. She gets very little exercise due to chronic vertigo her transcranial Doppler showed evidence of cerebral emboli would suggest a minimal PFO not likely to be clinically significant per Dr. Tobey Grim note. Her blood pressure is well controlled today at  118/80 in the office.She returns for reevaluation UPDATE 06/05/2015 CM Jamie [name] 70 year old female returns for follow-up. She has not had further stroke or TIA episodes. She has had one fall since last seen. She had a couple episodes of blurred vision without headache. She has not had any speech arrest, weakness on one side or the other etc. she continues to use her CPAP every night. She remains on enteric-coated aspirin. She continues to get little  exercise due to her chronic vertigo. Her transcranial Dopplers showed minimal PFO not likely to be clinically significant. She returns for reevaluation.  Interval history from 10/02/2015. Jamie [name] is doing very well she has had one of the severe headaches she used to describe in the past, -onset at about 5:30 after working on the computer. She developed a severe right-sided headache. Afraid this could be another TIA I  have recommended her to go to the emergency room, she was given morphine for the headaches which then caused another reaction and side effects. She did not have any speech arrest with this one. The emergency room did not find anything abnormal, she underwent a CT scan of the brain, which was normal, MRI brain  was normal. I'm also able today to just review a download of her CPAP machine which has been followed by Dr. Shelia Media but not in the last 3 years. She has 100% compliance for days 100% compliance for over 4 hours of daily use, average user time is 9 hours 7 minutes set pressure is 12 cm water no EPR is used residual AHI is 0.8. Excellent result. Her obesity was the same. No weight loss.  She was given a prescrition of Fioricet and ondansetron also known as Zofran. She's not supposed to take the medication before the headache component has started.    REVIEW OF SYSTEMS: Full 14 system review of systems performed and notable only for those listed, all others are neg:   Ear/Nose/Throat: Ringing in the ears, vertigo  Gastroitestinal: Urinary frequency Endocrine: Intolerance to cold Musculoskeletal: Muscle cramps joint pain, obese.  Allergy/Immunology: Environmental allergies Psychiatric: Anxiety Sleep : OSA, CPAP.    ALLERGIES: Allergies  Allergen Reactions  . Buprenorphine Hcl Rash and Shortness Of Breath  . Morphine And Related Shortness Of Breath and Rash  . Aspirin Other (See Comments)    REACTION: ulcers  Can take 81 mg  . Diflucan [Fluconazole] Hives    Whelps  and rash c skin peeling  . Latex Rash  . Moxifloxacin Palpitations    Tachycardia     HOME MEDICATIONS: Outpatient Prescriptions Prior to Visit  Medication Sig Dispense Refill  . aspirin EC 81 MG tablet Take 1 tablet (81 mg total) by mouth daily. 90 tablet 3  . BIOTIN PO Take 1 capsule by mouth at bedtime.     . butalbital-acetaminophen-caffeine (FIORICET) 50-325-40 MG tablet Take 1-2 tablets by mouth every 6 (six) hours as needed for headache. 20 tablet 0  . clidinium-chlordiazePOXIDE (LIBRAX) 5-2.5 MG capsule Take 1 capsule by mouth 2 (two) times daily. 60 capsule 11  . clonazePAM (KLONOPIN) 1 MG tablet Take 1 mg by mouth at bedtime.     . diphenhydrAMINE (BENADRYL) 25 MG tablet Take 25 mg by mouth every 8 (eight) hours as needed for allergies.     Marland Kitchen esomeprazole (NEXIUM) 40 MG capsule Take 1 capsule (40 mg total) by mouth 2 (two) times daily before a meal. 60 capsule 11  . fluticasone (FLONASE) 50 MCG/ACT nasal spray Place 2 sprays  into the nose daily as needed for rhinitis or allergies.    . Linaclotide (LINZESS) 145 MCG CAPS capsule Take 1 capsule (145 mcg total) by mouth daily. 30 capsule 11  . metaxalone (SKELAXIN) 800 MG tablet Take 800 mg by mouth at bedtime.    . metoprolol succinate (TOPROL-XL) 100 MG 24 hr tablet Take 100 mg by mouth every evening. Take with or immediately following a meal.    . MILK THISTLE PO Take 1 capsule by mouth at bedtime.     . Multiple Vitamin (MULTIVITAMIN WITH MINERALS) TABS Take 1 tablet by mouth every evening.     . olmesartan (BENICAR) 40 MG tablet Take 40 mg by mouth every evening.    . ondansetron (ZOFRAN) 4 MG tablet Take 1 tablet (4 mg total) by mouth every 6 (six) hours. 12 tablet 0  . spironolactone (ALDACTONE) 50 MG tablet Take 50 mg by mouth every morning.    Marland Kitchen UNABLE TO FIND Take 1 tablet by mouth daily. Med Name: probiotic once daily (10 billion units)    . clidinium-chlordiazePOXIDE (LIBRAX) 5-2.5 MG capsule Take 1 capsule by mouth 2  (two) times daily.    Marland Kitchen esomeprazole (NEXIUM) 40 MG capsule Take 40 mg by mouth 2 (two) times daily before a meal.    . Linaclotide (LINZESS) 145 MCG CAPS capsule Take 145 mcg by mouth daily.     Facility-Administered Medications Prior to Visit  Medication Dose Route Frequency Provider Last Rate Last Dose  . methylPREDNISolone acetate (DEPO-MEDROL) injection 80 mg  80 mg Intra-articular Once Cassandria Anger, MD        PAST MEDICAL HISTORY: Past Medical History  Diagnosis Date  . Asthma   . GERD (gastroesophageal reflux disease)   . Hypertension   . Obesity   . Allergic rhinitis   . IBS (irritable bowel syndrome)   . Fatty liver disease, nonalcoholic   . Adenomatous colon polyp   . Ulcer   . Dysrhythmia 1999    Dr Johnsie Cancel 2003  . Anxiety   . Pneumonia 2007  . Headache(784.0)     migraines with vision changes but no pain  . Heart murmur   . Cancer of kidney ([place])   . Sleep apnea     moderate  . Diverticulosis   . Elevated LFTs   . Renal cell carcinoma ([place])   . Colon polyps     adenomatous  . TIA (transient ischemic attack) 09/11/2014    PAST SURGICAL HISTORY: Past Surgical History  Procedure Laterality Date  . Abdominal hysterectomy  1979  . Tonsillectomy    . Breast reduction surgery  Jan 1991  . Kidney surgery Right October 2013     Tumor removed   . Elbow surgery Left     FAMILY HISTORY: Family History  Problem Relation Age of Onset  . Heart disease Father 67  . Lung cancer Father   . Liver disease Neg Hx   . Heart disease Brother   . Asthma Neg Hx   . Hypertension Other   . Colon cancer Cousin   . Allergies Daughter   . Allergies Brother   . Melanoma Daughter     SOCIAL HISTORY: Social History   Social History  . Marital Status: Married    Spouse Name: Sallyanne Kuster  . Number of Children: 2  . Years of Education: 15   Occupational History  . Retired Economist  .     Social History Main  Topics  . Smoking status: Never  Smoker   . Smokeless tobacco: Never Used  . Alcohol Use: 3.6 oz/week    6 Glasses of wine per week     Comment: daily>>>counselled to reduce ETOH use as of 06-23-13, has 1-2 glasses wine daily 4-5 days week  . Drug Use: No  . Sexual Activity: Yes    Birth Control/ Protection: Surgical   Other Topics Concern  . Not on file   Social History Narrative   Exercise 3-4 times a week.   Married Sallyanne Kuster) and lives with her spouse.   Retired Personal assistant.   Education- college   Patient is right handed.   Patient drinks tea occasionally .     PHYSICAL EXAM  Filed Vitals:   10/02/15 1434  BP: 102/74  Pulse: 82  Resp: 20  Height: 5' 2"  (1.575 m)  Weight: 226 lb (102.513 kg)   Body mass index is 41.33 kg/(m^2).    Generalized: Well developed, obese female in no acute distress  Head: normocephalic and atraumatic, Oropharynx benign , Mallampati grade 2, neck circumference 17.5 inches. She had tonsils removed at age 72. Chronic sinusitis.  Neck: Supple, no carotid bruits  Cardiac: Regular rate rhythm, no murmur  Musculoskeletal: No deformity   Neurological examination   Mentation: Alert oriented to time, place, history taking. Attention span and concentration appropriate. Recent and remote memory intact, language fluent. Cranial nerves  Sense of smell or taste. She reports smells - olfactory aura, smell of roses.  Pupils were equal round reactive to light extraocular movements were full, visual field were full on confrontational test. Facial sensation and strength were normal. hearing was intact to finger rubbing bilaterally. Uvula tongue midline. head turning and shoulder shrug were normal and symmetric.Tongue protrusion into cheek strength was normal. Motor: normal bulk and tone, full strength in the BUE, BLE, fine finger movements normal, no pronator drift. No focal weakness Sensory: normal and symmetric to light touch, pinprick, and Vibration, proprioception   Coordination: finger-nose-finger, heel-to-shin bilaterally, no dysmetria Reflexes: 2/2, Gait and Station: Rising up from seated position without assistance,  normal stance,moderate stride, good arm swing, smooth turning,Tandem gait is unsteady.  Romberg is negative, no assistive device   DIAGNOSTIC DATA (LABS, IMAGING, TESTING) - I reviewed patient records, labs, notes, testing and imaging myself where available.  Lab Results  Component Value Date   WBC 8.2 08/16/2015   HGB 12.6 08/16/2015   HGB 12.1 08/16/2015   HCT 37.0 08/16/2015   HCT 36.4 08/16/2015   MCV 93.8 08/16/2015   PLT 189 08/16/2015      Component Value Date/Time   NA 139 08/16/2015 2139   NA 139 08/16/2015 2139   K 4.2 08/16/2015 2139   K 4.2 08/16/2015 2139   CL 106 08/16/2015 2139   CL 106 08/16/2015 2139   CO2 22 08/16/2015 2139   GLUCOSE 116* 08/16/2015 2139   GLUCOSE 117* 08/16/2015 2139   BUN 21* 08/16/2015 2139   BUN 18 08/16/2015 2139   CREATININE 1.00 08/16/2015 2139   CREATININE 1.10* 08/16/2015 2139   CALCIUM 9.8 08/16/2015 2139   PROT 6.2* 08/16/2015 2139   ALBUMIN 3.6 08/16/2015 2139   AST 41 08/16/2015 2139   ALT 74* 08/16/2015 2139   ALKPHOS 52 08/16/2015 2139   BILITOT 0.5 08/16/2015 2139   GFRNONAA 51* 08/16/2015 2139   GFRAA 14* 08/16/2015 2139     ASSESSMENT AND PLAN  70 y.o. year old female, seen here for a  rv for sleep apnea and CPAP compliance.  She cannot sleep without her machine. Is highly compliant. Dr Shelia Media is following this. Last check in 3 or 4  years.  Vertigo has been a hindrance when she goes shopping especially when her gaze goes up and down left and right periods sudden changes in gaze direction bring on the vertigo and she falls forward. She has undergone vestibular rehabilitation without success. She is awaiting an appointment with a specialist at [facility] in [place].  Part of our discussion today is if Jamie [name] should have to go to the emergency  room every time she has a visual change. The answer is for the future no only if her vision change is associated with word finding difficulties speech arrest or hemiparesis. Otherwise she should take an extra aspirin and rest. If the headache arises after the visual aura she has medication to abort it. I will be happy to refill the ondansetron should she need additional mouth nausea medication. Butalbutal is not a good chance.   Asthma;  Hypertension; Obesity; Headache(784.0); Sleep apnea; ; Elevated LFTs; Renal cell carcinoma ([place]); and TIA (transient ischemic attack) (09/11/2014). here to follow-up .   BPV - vestibular rehab not working for her. Bubble study discussed, she has a heart murmur. Awaiting echocardiogram tomorrow!  Continue baby aspirin for secondary stroke prevention Blood pressure in excellent control today 111/70 Low intensity exercise for overall health and well-being Continue CPAP for obstructive sleep apnea Follow-up in 3 months next with Dennie Bible, GNP, 12 month after that with MD   [facility], [facility] [address] [place], [place] [id number] [phone]

## 2015-10-02 NOTE — Patient Instructions (Signed)
Calorie Counting for Weight Loss Calories are energy you get from the things you eat and drink. Your body uses this energy to keep you going throughout the day. The number of calories you eat affects your weight. When you eat more calories than your body needs, your body stores the extra calories as fat. When you eat fewer calories than your body needs, your body burns fat to get the energy it needs. Calorie counting means keeping track of how many calories you eat and drink each day. If you make sure to eat fewer calories than your body needs, you should lose weight. In order for calorie counting to work, you will need to eat the number of calories that are right for you in a day to lose a healthy amount of weight per week. A healthy amount of weight to lose per week is usually 1-2 lb (0.5-0.9 kg). A dietitian can determine how many calories you need in a day and give you suggestions on how to reach your calorie goal.  WHAT IS MY MY PLAN? My goal is to have __________ calories per day.  If I have this many calories per day, I should lose around __________ pounds per week. WHAT DO I NEED TO KNOW ABOUT CALORIE COUNTING? In order to meet your daily calorie goal, you will need to:  Find out how many calories are in each food you would like to eat. Try to do this before you eat.  Decide how much of the food you can eat.  Write down what you ate and how many calories it had. Doing this is called keeping a food log. WHERE DO I FIND CALORIE INFORMATION? The number of calories in a food can be found on a Nutrition Facts label. Note that all the information on a label is based on a specific serving of the food. If a food does not have a Nutrition Facts label, try to look up the calories online or ask your dietitian for help. HOW DO I DECIDE HOW MUCH TO EAT? To decide how much of the food you can eat, you will need to consider both the number of calories in one serving and the size of one serving. This  information can be found on the Nutrition Facts label. If a food does not have a Nutrition Facts label, look up the information online or ask your dietitian for help. Remember that calories are listed per serving. If you choose to have more than one serving of a food, you will have to multiply the calories per serving by the amount of servings you plan to eat. For example, the label on a package of bread might say that a serving size is 1 slice and that there are 90 calories in a serving. If you eat 1 slice, you will have eaten 90 calories. If you eat 2 slices, you will have eaten 180 calories. HOW DO I KEEP A FOOD LOG? After each meal, record the following information in your food log:  What you ate.  How much of it you ate.  How many calories it had.  Then, add up your calories. Keep your food log near you, such as in a small notebook in your pocket. Another option is to use a mobile app or website. Some programs will calculate calories for you and show you how many calories you have left each time you add an item to the log. WHAT ARE SOME CALORIE COUNTING TIPS?  Use your calories on foods   and drinks that will fill you up and not leave you hungry. Some examples of this include foods like nuts and nut butters, vegetables, lean proteins, and high-fiber foods (more than 5 g fiber per serving).  Eat nutritious foods and avoid empty calories. Empty calories are calories you get from foods or beverages that do not have many nutrients, such as candy and soda. It is better to have a nutritious high-calorie food (such as an avocado) than a food with few nutrients (such as a bag of chips).  Know how many calories are in the foods you eat most often. This way, you do not have to look up how many calories they have each time you eat them.  Look out for foods that may seem like low-calorie foods but are really high-calorie foods, such as baked goods, soda, and fat-free candy.  Pay attention to calories  in drinks. Drinks such as sodas, specialty coffee drinks, alcohol, and juices have a lot of calories yet do not fill you up. Choose low-calorie drinks like water and diet drinks.  Focus your calorie counting efforts on higher calorie items. Logging the calories in a garden salad that contains only vegetables is less important than calculating the calories in a milk shake.  Find a way of tracking calories that works for you. Get creative. Most people who are successful find ways to keep track of how much they eat in a day, even if they do not count every calorie. WHAT ARE SOME PORTION CONTROL TIPS?  Know how many calories are in a serving. This will help you know how many servings of a certain food you can have.  Use a measuring cup to measure serving sizes. This is helpful when you start out. With time, you will be able to estimate serving sizes for some foods.  Take some time to put servings of different foods on your favorite plates, bowls, and cups so you know what a serving looks like.  Try not to eat straight from a bag or box. Doing this can lead to overeating. Put the amount you would like to eat in a cup or on a plate to make sure you are eating the right portion.  Use smaller plates, glasses, and bowls to prevent overeating. This is a quick and easy way to practice portion control. If your plate is smaller, less food can fit on it.  Try not to multitask while eating, such as watching TV or using your computer. If it is time to eat, sit down at a table and enjoy your food. Doing this will help you to start recognizing when you are full. It will also make you more aware of what and how much you are eating. HOW CAN I CALORIE COUNT WHEN EATING OUT?  Ask for smaller portion sizes or child-sized portions.  Consider sharing an entree and sides instead of getting your own entree.  If you get your own entree, eat only half. Ask for a box at the beginning of your meal and put the rest of your  entree in it so you are not tempted to eat it.  Look for the calories on the menu. If calories are listed, choose the lower calorie options.  Choose dishes that include vegetables, fruits, whole grains, low-fat dairy products, and lean protein. Focusing on smart food choices from each of the 5 food groups can help you stay on track at restaurants.  Choose items that are boiled, broiled, grilled, or steamed.  Choose   water, milk, unsweetened iced tea, or other drinks without added sugars. If you want an alcoholic beverage, choose a lower calorie option. For example, a regular margarita can have up to 700 calories and a glass of wine has around 150.  Stay away from items that are buttered, battered, fried, or served with cream sauce. Items labeled "crispy" are usually fried, unless stated otherwise.  Ask for dressings, sauces, and syrups on the side. These are usually very high in calories, so do not eat much of them.  Watch out for salads. Many people think salads are a healthy option, but this is often not the case. Many salads come with bacon, fried chicken, lots of cheese, fried chips, and dressing. All of these items have a lot of calories. If you want a salad, choose a garden salad and ask for grilled meats or steak. Ask for the dressing on the side, or ask for olive oil and vinegar or lemon to use as dressing.  Estimate how many servings of a food you are given. For example, a serving of cooked rice is  cup or about the size of half a tennis ball or one cupcake wrapper. Knowing serving sizes will help you be aware of how much food you are eating at restaurants. The list below tells you how big or small some common portion sizes are based on everyday objects.  1 oz--4 stacked dice.  3 oz--1 deck of cards.  1 tsp--1 dice.  1 Tbsp-- a Ping-Pong ball.  2 Tbsp--1 Ping-Pong ball.   cup--1 tennis ball or 1 cupcake wrapper.  1 cup--1 baseball.   This information is not intended to  replace advice given to you by your health care provider. Make sure you discuss any questions you have with your health care provider.   Document Released: 06/24/2005 Document Revised: 07/15/2014 Document Reviewed: 04/29/2013 Elsevier Interactive Patient Education 2016 [employer]  Exercising to Lose Weight Exercising can help you to lose weight. In order to lose weight through exercise, you need to do vigorous-intensity exercise. You can tell that you are exercising with vigorous intensity if you are breathing very hard and fast and cannot hold a conversation while exercising. Moderate-intensity exercise helps to maintain your current weight. You can tell that you are exercising at a moderate level if you have a higher heart rate and faster breathing, but you are still able to hold a conversation. HOW OFTEN SHOULD I EXERCISE? Choose an activity that you enjoy and set realistic goals. Your health care provider can help you to make an activity plan that works for you. Exercise regularly as directed by your health care provider. This may include:  Doing resistance training twice each week, such as:  Push-ups.  Sit-ups.  Lifting weights.  Using resistance bands.  Doing a given intensity of exercise for a given amount of time. Choose from these options:  150 minutes of moderate-intensity exercise every week.  75 minutes of vigorous-intensity exercise every week.  A mix of moderate-intensity and vigorous-intensity exercise every week. Children, pregnant women, people who are out of shape, people who are overweight, and older adults may need to consult a health care provider for individual recommendations. If you have any sort of medical condition, be sure to consult your health care provider before starting a new exercise program. WHAT ARE SOME ACTIVITIES THAT CAN HELP ME TO LOSE WEIGHT?   Walking at a rate of at least 4.5 miles an hour.  Jogging or running at a rate   of 5 miles per  hour.  Biking at a rate of at least 10 miles per hour.  Lap swimming.  Roller-skating or in-line skating.  Cross-country skiing.  Vigorous competitive sports, such as football, basketball, and soccer.  Jumping rope.  Aerobic dancing. HOW CAN I BE MORE ACTIVE IN MY DAY-TO-DAY ACTIVITIES?  Use the stairs instead of the elevator.  Take a walk during your lunch break.  If you drive, park your car farther away from work or school.  If you take public transportation, get off one stop early and walk the rest of the way.  Make all of your phone calls while standing up and walking around.  Get up, stretch, and walk around every 30 minutes throughout the day. WHAT GUIDELINES SHOULD I FOLLOW WHILE EXERCISING?  Do not exercise so much that you hurt yourself, feel dizzy, or get very short of breath.  Consult your health care provider prior to starting a new exercise program.  Wear comfortable clothes and shoes with good support.  Drink plenty of water while you exercise to prevent dehydration or heat stroke. Body water is lost during exercise and must be replaced.  Work out until you breathe faster and your heart beats faster.   This information is not intended to replace advice given to you by your health care provider. Make sure you discuss any questions you have with your health care provider.   Document Released: 07/27/2010 Document Revised: 07/15/2014 Document Reviewed: 11/25/2013 Elsevier Interactive Patient Education 2016 Elsevier Inc.  

## 2015-10-02 NOTE — Progress Notes (Signed)
HISTORY OF PRESENT ILLNESS:  Jamie [name] [name] is a 70 y.o. female with multiple medical problems as listed below. She is followed in this office for fatty liver, GERD, IBS, constipation, and adenomatous colon polyps. She was last evaluated 09/26/2014. She that dictation for details. At that time constipation not responding to MiraLAX led to prescribing linzess 145 g daily. She reports that this works nicely. For her reflux, she requires twice a day PPI for good control of symptoms. For her IBS, rare use of Librax. She has been advised previously with regards to exercise and weight loss regarding fatty liver. However, she has had no success. She struggles with multiple neurologic issues including migraines, vertigo, and a history of TIA. She denies any dysphagia. No bleeding. No abdominal pain. Last colonoscopy was June 2013 with diminutive adenomas and hyperplastic polyp removed.  REVIEW OF SYSTEMS:  All non-GI ROS negative except for anxiety, arthritis, headaches, urinary leakage, shortness of breath, dizziness  Past Medical History  Diagnosis Date  . Asthma   . GERD (gastroesophageal reflux disease)   . Hypertension   . Obesity   . Allergic rhinitis   . IBS (irritable bowel syndrome)   . Fatty liver disease, nonalcoholic   . Adenomatous colon polyp   . Ulcer   . Dysrhythmia 1999    Dr Johnsie Cancel 2003  . Anxiety   . Pneumonia 2007  . Headache(784.0)     migraines with vision changes but no pain  . Heart murmur   . Cancer of kidney ([place])   . Sleep apnea     moderate  . Diverticulosis   . Elevated LFTs   . Renal cell carcinoma ([place])   . Colon polyps     adenomatous  . TIA (transient ischemic attack) 09/11/2014    Past Surgical History  Procedure Laterality Date  . Abdominal hysterectomy  1979  . Tonsillectomy    . Breast reduction surgery  Jan 1991  . Kidney surgery Right October 2013     Tumor removed   . Elbow surgery Left     Social History Jamie [name] [name]  reports that  she has never smoked. She has never used smokeless tobacco. She reports that she drinks about 3.6 oz of alcohol per week. She reports that she does not use illicit drugs.  family history includes Allergies in her brother and daughter; Colon cancer in her cousin; Heart disease in her brother; Heart disease (age of onset: 35) in her father; Hypertension in her other; Lung cancer in her father; Melanoma in her daughter. There is no history of Liver disease or Asthma.  Allergies  Allergen Reactions  . Morphine And Related Shortness Of Breath and Rash  . Aspirin Other (See Comments)    REACTION: ulcers  Can take 81 mg  . Diflucan [Fluconazole] Hives    Whelps and rash c skin peeling  . Latex Rash  . Moxifloxacin Palpitations    Tachycardia        PHYSICAL EXAMINATION: Vital signs: BP 126/60 mmHg  Pulse 72  Ht 5' 2.5" (1.588 m)  Wt 227 lb (102.967 kg)  BMI 40.83 kg/m2  Constitutional: Obese, generally well-appearing, no acute distress Psychiatric: alert and oriented x3, cooperative Eyes: extraocular movements intact, anicteric, conjunctiva pink Mouth: oral pharynx moist, no lesions Neck: supple no lymphadenopathy Cardiovascular: heart regular rate and rhythm, no murmur Lungs: clear to auscultation bilaterally Abdomen: soft, obese, nontender, nondistended, no obvious ascites, no peritoneal signs, normal bowel sounds, no organomegaly Rectal: Omitted Extremities:  no cyanosis or lower extremity edema bilaterally Skin: no lesions on visible extremities Neuro: No focal deficits. Cranial nerves intact  ASSESSMENT:  #1. Constipation. Responding nicely to linzess #2. Chronic GERD. Continues to require twice a day PPI for control #3. IBS. Occasional Librax required #4. Fatty liver. Ongoing. #5. Obesity. Ongoing #6. History of adenomatous colon polyps. Last colonoscopy June 2013. No relevant lower GI symptoms currently #7. Multiple medical problems  PLAN:  #1. Continue linzess.  Prescription refilled #2. Continue PPI. Prescription refilled #3. On demand Librax. Prescription refilled #4. Exercise and weight loss. Counseled #5. Routine office follow-up one year #8. Surveillance colonoscopy around June 2018. Contact the office in the interim for questions or problems

## 2015-10-02 NOTE — Addendum Note (Signed)
Addended by: Larey Seat on: 10/02/2015 03:34 PM   Modules accepted: Orders

## 2015-10-02 NOTE — Patient Instructions (Signed)
We have sent one years worth of refills to your pharmacy for linzess and nexium Please follow up with Dr. Henrene Pastor in one year.

## 2015-10-03 ENCOUNTER — Ambulatory Visit (HOSPITAL_COMMUNITY): Payer: Medicare Other

## 2015-10-03 ENCOUNTER — Ambulatory Visit (HOSPITAL_COMMUNITY): Payer: Medicare Other | Attending: Cardiology

## 2015-10-03 ENCOUNTER — Other Ambulatory Visit: Payer: Self-pay

## 2015-10-03 DIAGNOSIS — Z6841 Body Mass Index (BMI) 40.0 and over, adult: Secondary | ICD-10-CM | POA: Insufficient documentation

## 2015-10-03 DIAGNOSIS — R079 Chest pain, unspecified: Secondary | ICD-10-CM | POA: Diagnosis present

## 2015-10-03 DIAGNOSIS — I119 Hypertensive heart disease without heart failure: Secondary | ICD-10-CM | POA: Insufficient documentation

## 2015-10-03 DIAGNOSIS — G4733 Obstructive sleep apnea (adult) (pediatric): Secondary | ICD-10-CM | POA: Diagnosis not present

## 2015-10-03 DIAGNOSIS — R739 Hyperglycemia, unspecified: Secondary | ICD-10-CM | POA: Insufficient documentation

## 2015-10-03 DIAGNOSIS — R0789 Other chest pain: Secondary | ICD-10-CM

## 2015-10-03 DIAGNOSIS — G458 Other transient cerebral ischemic attacks and related syndromes: Secondary | ICD-10-CM | POA: Diagnosis not present

## 2015-10-03 LAB — MYOCARDIAL PERFUSION IMAGING
CHL CUP RESTING HR STRESS: 71 {beats}/min
CSEPPHR: 85 {beats}/min
LV sys vol: 36 mL
LVDIAVOL: 87 mL (ref 46–106)
NUC STRESS TID: 1
RATE: 0.36
SDS: 4
SRS: 7
SSS: 10

## 2015-10-03 MED ORDER — TECHNETIUM TC 99M SESTAMIBI GENERIC - CARDIOLITE
32.3000 | Freq: Once | INTRAVENOUS | Status: AC | PRN
  Administered 2015-10-03: 32.3 via INTRAVENOUS

## 2015-10-04 ENCOUNTER — Telehealth: Payer: Self-pay | Admitting: Cardiovascular Disease

## 2015-10-04 NOTE — Telephone Encounter (Signed)
Called patient back about echo bubble study results. Per Dr. Johnsie Cancel, Normal echo with good EF and no significant valvular heart disease. Negative bubble study for shunt. Patient verbalized understanding.

## 2015-10-04 NOTE — Telephone Encounter (Signed)
Mrs. Trunnell is returning a call about her test results . Please call   Thanks

## 2015-10-08 ENCOUNTER — Emergency Department (HOSPITAL_COMMUNITY)
Admission: EM | Admit: 2015-10-08 | Discharge: 2015-10-08 | Disposition: A | Discharge status: Home / Self Care | Payer: Medicare Other | Attending: Emergency Medicine | Admitting: Emergency Medicine

## 2015-10-08 ENCOUNTER — Emergency Department (HOSPITAL_COMMUNITY): Payer: Medicare Other

## 2015-10-08 ENCOUNTER — Encounter (HOSPITAL_COMMUNITY): Payer: Self-pay | Admitting: Radiology

## 2015-10-08 DIAGNOSIS — W108XXA Fall (on) (from) other stairs and steps, initial encounter: Secondary | ICD-10-CM | POA: Diagnosis not present

## 2015-10-08 DIAGNOSIS — S299XXA Unspecified injury of thorax, initial encounter: Secondary | ICD-10-CM | POA: Diagnosis not present

## 2015-10-08 DIAGNOSIS — S060X1A Concussion with loss of consciousness of 30 minutes or less, initial encounter: Secondary | ICD-10-CM | POA: Diagnosis not present

## 2015-10-08 DIAGNOSIS — Y9389 Activity, other specified: Secondary | ICD-10-CM | POA: Insufficient documentation

## 2015-10-08 DIAGNOSIS — Y998 Other external cause status: Secondary | ICD-10-CM | POA: Insufficient documentation

## 2015-10-08 DIAGNOSIS — M25512 Pain in left shoulder: Secondary | ICD-10-CM | POA: Diagnosis not present

## 2015-10-08 DIAGNOSIS — S3991XA Unspecified injury of abdomen, initial encounter: Secondary | ICD-10-CM | POA: Diagnosis not present

## 2015-10-08 DIAGNOSIS — F10120 Alcohol abuse with intoxication, uncomplicated: Secondary | ICD-10-CM | POA: Insufficient documentation

## 2015-10-08 DIAGNOSIS — S3993XA Unspecified injury of pelvis, initial encounter: Secondary | ICD-10-CM | POA: Diagnosis not present

## 2015-10-08 DIAGNOSIS — F1310 Sedative, hypnotic or anxiolytic abuse, uncomplicated: Secondary | ICD-10-CM | POA: Insufficient documentation

## 2015-10-08 DIAGNOSIS — S199XXA Unspecified injury of neck, initial encounter: Secondary | ICD-10-CM | POA: Diagnosis not present

## 2015-10-08 DIAGNOSIS — S0990XA Unspecified injury of head, initial encounter: Secondary | ICD-10-CM

## 2015-10-08 DIAGNOSIS — R402421 Glasgow coma scale score 9-12, in the field [EMT or ambulance]: Secondary | ICD-10-CM | POA: Diagnosis not present

## 2015-10-08 DIAGNOSIS — F10920 Alcohol use, unspecified with intoxication, uncomplicated: Secondary | ICD-10-CM

## 2015-10-08 DIAGNOSIS — W19XXXA Unspecified fall, initial encounter: Secondary | ICD-10-CM

## 2015-10-08 DIAGNOSIS — Y9289 Other specified places as the place of occurrence of the external cause: Secondary | ICD-10-CM | POA: Diagnosis not present

## 2015-10-08 DIAGNOSIS — J8410 Pulmonary fibrosis, unspecified: Secondary | ICD-10-CM | POA: Diagnosis not present

## 2015-10-08 LAB — CDS SEROLOGY

## 2015-10-08 LAB — TYPE AND SCREEN
ABO/RH(D): A POS
Antibody Screen: NEGATIVE
UNIT DIVISION: 0
Unit division: 0

## 2015-10-08 LAB — CBC
HCT: 40.2 % (ref 36.0–46.0)
HEMOGLOBIN: 13.1 g/dL (ref 12.0–15.0)
MCH: 30.8 pg (ref 26.0–34.0)
MCHC: 32.6 g/dL (ref 30.0–36.0)
MCV: 94.6 fL (ref 78.0–100.0)
Platelets: 210 10*3/uL (ref 150–400)
RBC: 4.25 MIL/uL (ref 3.87–5.11)
RDW: 12.8 % (ref 11.5–15.5)
WBC: 8.4 10*3/uL (ref 4.0–10.5)

## 2015-10-08 LAB — COMPREHENSIVE METABOLIC PANEL
ALK PHOS: 56 U/L (ref 38–126)
ALT: 117 U/L — ABNORMAL HIGH (ref 14–54)
ANION GAP: 15 (ref 5–15)
AST: 69 U/L — ABNORMAL HIGH (ref 15–41)
Albumin: 3.9 g/dL (ref 3.5–5.0)
BUN: 14 mg/dL (ref 6–20)
CALCIUM: 9.1 mg/dL (ref 8.9–10.3)
CHLORIDE: 105 mmol/L (ref 101–111)
CO2: 18 mmol/L — AB (ref 22–32)
Creatinine, Ser: 0.85 mg/dL (ref 0.44–1.00)
Glucose, Bld: 114 mg/dL — ABNORMAL HIGH (ref 65–99)
Potassium: 4 mmol/L (ref 3.5–5.1)
SODIUM: 138 mmol/L (ref 135–145)
Total Bilirubin: 0.5 mg/dL (ref 0.3–1.2)
Total Protein: 6.7 g/dL (ref 6.5–8.1)

## 2015-10-08 LAB — URINALYSIS, ROUTINE W REFLEX MICROSCOPIC
BILIRUBIN URINE: NEGATIVE
Glucose, UA: NEGATIVE mg/dL
Hgb urine dipstick: NEGATIVE
KETONES UR: NEGATIVE mg/dL
Leukocytes, UA: NEGATIVE
NITRITE: NEGATIVE
PH: 6 (ref 5.0–8.0)
PROTEIN: NEGATIVE mg/dL

## 2015-10-08 LAB — PREPARE FRESH FROZEN PLASMA
Unit division: 0
Unit division: 0

## 2015-10-08 LAB — RAPID URINE DRUG SCREEN, HOSP PERFORMED
Amphetamines: NOT DETECTED
Barbiturates: NOT DETECTED
Benzodiazepines: POSITIVE — AB
COCAINE: NOT DETECTED
OPIATES: NOT DETECTED
TETRAHYDROCANNABINOL: NOT DETECTED

## 2015-10-08 LAB — ABO/RH: ABO/RH(D): A POS

## 2015-10-08 LAB — PROTIME-INR
INR: 1 (ref 0.00–1.49)
Prothrombin Time: 13.4 seconds (ref 11.6–15.2)

## 2015-10-08 LAB — ETHANOL: ALCOHOL ETHYL (B): 248 mg/dL — AB (ref <5)

## 2015-10-08 MED ORDER — MIDAZOLAM HCL 2 MG/2ML IJ SOLN
1.0000 mg | Freq: Once | INTRAMUSCULAR | Status: AC
  Administered 2015-10-08: 1 mg via INTRAVENOUS

## 2015-10-08 MED ORDER — HYDROCODONE-ACETAMINOPHEN 5-325 MG PO TABS
1.0000 | ORAL_TABLET | Freq: Four times a day (QID) | ORAL | Status: DC | PRN
Start: 2015-10-08 — End: 2016-08-13

## 2015-10-08 MED ORDER — IOPAMIDOL (ISOVUE-300) INJECTION 61%
INTRAVENOUS | Status: AC
  Filled 2015-10-08: qty 100

## 2015-10-08 MED ORDER — IOPAMIDOL (ISOVUE-300) INJECTION 61%
80.0000 mL | Freq: Once | INTRAVENOUS | Status: AC | PRN
  Administered 2015-10-08: 100 mL via INTRAVENOUS

## 2015-10-08 MED ORDER — SODIUM CHLORIDE 0.9 % IV SOLN
INTRAVENOUS | Status: AC | PRN
  Administered 2015-10-08: 1000 mL via INTRAVENOUS

## 2015-10-08 MED ORDER — MIDAZOLAM HCL 2 MG/2ML IJ SOLN
INTRAMUSCULAR | Status: AC
  Filled 2015-10-08: qty 2

## 2015-10-08 NOTE — ED Notes (Signed)
Complete bed linen change after a urinary incontinent episode , new gown , repositioned on bed / warm blankets provided .

## 2015-10-08 NOTE — ED Notes (Signed)
Family at beside. Family given emotional support. 

## 2015-10-08 NOTE — ED Notes (Signed)
According to EMS, the patient's husband heard the patient fall down the stairs.  EMS said the patient and her husband had two drinks earlier.  Upon EMS arrival, the patient was a GCS-3, unresponsive no injuries seen.   EMS did not know the LSN time.  On transport, the patient was alert and following commands but could not communicate with EMS.  The patient was transported to [facility].  The husband is at the bedside, he did say the patient was normal at 0100.  He did say the sleep in separate bedrooms and he thought she was getting in bed but then he heard her fall.

## 2015-10-08 NOTE — ED Notes (Signed)
Pt. ambulated with minimal assistance , endorses mild left shoulder joint pain with movement , EDP ordered shoulder x-ray.

## 2015-10-08 NOTE — Discharge Instructions (Signed)
Alcohol Intoxication Alcohol intoxication occurs when the amount of alcohol that a person has consumed impairs his or her ability to mentally and physically function. Alcohol directly impairs the normal chemical activity of the brain. Drinking large amounts of alcohol can lead to changes in mental function and behavior, and it can cause many physical effects that can be harmful.  Alcohol intoxication can range in severity from mild to very severe. Various factors can affect the level of intoxication that occurs, such as the person's age, gender, weight, frequency of alcohol consumption, and the presence of other medical conditions (such as diabetes, seizures, or heart conditions). Dangerous levels of alcohol intoxication may occur when people drink large amounts of alcohol in a short period (binge drinking). Alcohol can also be especially dangerous when combined with certain prescription medicines or "recreational" drugs. SIGNS AND SYMPTOMS Some common signs and symptoms of mild alcohol intoxication include:  Loss of coordination.  Changes in mood and behavior.  Impaired judgment.  Slurred speech. As alcohol intoxication progresses to more severe levels, other signs and symptoms will appear. These may include:  Vomiting.  Confusion and impaired memory.  Slowed breathing.  Seizures.  Loss of consciousness. DIAGNOSIS  Your health care provider will take a medical history and perform a physical exam. You will be asked about the amount and type of alcohol you have consumed. Blood tests will be done to measure the concentration of alcohol in your blood. In many places, your blood alcohol level must be lower than 80 mg/dL (0.08%) to legally drive. However, many dangerous effects of alcohol can occur at much lower levels.  TREATMENT  People with alcohol intoxication often do not require treatment. Most of the effects of alcohol intoxication are temporary, and they go away as the alcohol naturally  leaves the body. Your health care provider will monitor your condition until you are stable enough to go home. Fluids are sometimes given through an IV access tube to help prevent dehydration.  HOME CARE INSTRUCTIONS  Do not drive after drinking alcohol.  Stay hydrated. Drink enough water and fluids to keep your urine clear or pale yellow. Avoid caffeine.   Only take over-the-counter or prescription medicines as directed by your health care provider.  SEEK MEDICAL CARE IF:   You have persistent vomiting.   You do not feel better after a few days.  You have frequent alcohol intoxication. Your health care provider can help determine if you should see a substance use treatment counselor. SEEK IMMEDIATE MEDICAL CARE IF:   You become shaky or tremble when you try to stop drinking.   You shake uncontrollably (seizure).   You throw up (vomit) blood. This may be bright red or may look like black coffee grounds.   You have blood in your stool. This may be bright red or may appear as a black, tarry, bad smelling stool.   You become lightheaded or faint.  MAKE SURE YOU:   Understand these instructions.  Will watch your condition.  Will get help right away if you are not doing well or get worse.   This information is not intended to replace advice given to you by your health care provider. Make sure you discuss any questions you have with your health care provider.   Document Released: 04/03/2005 Document Revised: 02/24/2013 Document Reviewed: 11/27/2012 Elsevier Interactive Patient Education 2016 [place], Adult A concussion, or closed-head injury, is a brain injury caused by a direct blow to the head or by  a quick and sudden movement (jolt) of the head or neck. Concussions are usually not life-threatening. Even so, the effects of a concussion can be serious. If you have had a concussion before, you are more likely to experience concussion-like symptoms after a  direct blow to the head.  CAUSES  Direct blow to the head, such as from running into another player during a soccer game, being hit in a fight, or hitting your head on a hard surface.  A jolt of the head or neck that causes the brain to move back and forth inside the skull, such as in a car crash. SIGNS AND SYMPTOMS The signs of a concussion can be hard to notice. Early on, they may be missed by you, family members, and health care providers. You may look fine but act or feel differently. Symptoms are usually temporary, but they may last for days, weeks, or even longer. Some symptoms may appear right away while others may not show up for hours or days. Every head injury is different. Symptoms include:  Mild to moderate headaches that will not go away.  A feeling of pressure inside your head.  Having more trouble than usual:  Learning or remembering things you have heard.  Answering questions.  Paying attention or concentrating.  Organizing daily tasks.  Making decisions and solving problems.  Slowness in thinking, acting or reacting, speaking, or reading.  Getting lost or being easily confused.  Feeling tired all the time or lacking energy (fatigued).  Feeling drowsy.  Sleep disturbances.  Sleeping more than usual.  Sleeping less than usual.  Trouble falling asleep.  Trouble sleeping (insomnia).  Loss of balance or feeling lightheaded or dizzy.  Nausea or vomiting.  Numbness or tingling.  Increased sensitivity to:  Sounds.  Lights.  Distractions.  Vision problems or eyes that tire easily.  Diminished sense of taste or smell.  Ringing in the ears.  Mood changes such as feeling sad or anxious.  Becoming easily irritated or angry for little or no reason.  Lack of motivation.  Seeing or hearing things other people do not see or hear (hallucinations). DIAGNOSIS Your health care provider can usually diagnose a concussion based on a description of your  injury and symptoms. He or she will ask whether you passed out (lost consciousness) and whether you are having trouble remembering events that happened right before and during your injury. Your evaluation might include:  A brain scan to look for signs of injury to the brain. Even if the test shows no injury, you may still have a concussion.  Blood tests to be sure other problems are not present. TREATMENT  Concussions are usually treated in an emergency department, in urgent care, or at a clinic. You may need to stay in the hospital overnight for further treatment.  Tell your health care provider if you are taking any medicines, including prescription medicines, over-the-counter medicines, and natural remedies. Some medicines, such as blood thinners (anticoagulants) and aspirin, may increase the chance of complications. Also tell your health care provider whether you have had alcohol or are taking illegal drugs. This information may affect treatment.  Your health care provider will send you home with important instructions to follow.  How fast you will recover from a concussion depends on many factors. These factors include how severe your concussion is, what part of your brain was injured, your age, and how healthy you were before the concussion.  Most people with mild injuries recover fully. Recovery can  take time. In general, recovery is slower in older persons. Also, persons who have had a concussion in the past or have other medical problems may find that it takes longer to recover from their current injury. HOME CARE INSTRUCTIONS General Instructions  Carefully follow the directions your health care provider gave you.  Only take over-the-counter or prescription medicines for pain, discomfort, or fever as directed by your health care provider.  Take only those medicines that your health care provider has approved.  Do not drink alcohol until your health care provider says you are well  enough to do so. Alcohol and certain other drugs may slow your recovery and can put you at risk of further injury.  If it is harder than usual to remember things, write them down.  If you are easily distracted, try to do one thing at a time. For example, do not try to watch TV while fixing dinner.  Talk with family members or close friends when making important decisions.  Keep all follow-up appointments. Repeated evaluation of your symptoms is recommended for your recovery.  Watch your symptoms and tell others to do the same. Complications sometimes occur after a concussion. Older adults with a brain injury may have a higher risk of serious complications, such as a blood clot on the brain.  Tell your teachers, school nurse, school counselor, coach, athletic trainer, or work Freight forwarder about your injury, symptoms, and restrictions. Tell them about what you can or cannot do. They should watch for:  Increased problems with attention or concentration.  Increased difficulty remembering or learning new information.  Increased time needed to complete tasks or assignments.  Increased irritability or decreased ability to cope with stress.  Increased symptoms.  Rest. Rest helps the brain to heal. Make sure you:  Get plenty of sleep at night. Avoid staying up late at night.  Keep the same bedtime hours on weekends and weekdays.  Rest during the day. Take daytime naps or rest breaks when you feel tired.  Limit activities that require a lot of thought or concentration. These include:  Doing homework or job-related work.  Watching TV.  Working on the computer.  Avoid any situation where there is potential for another head injury (football, hockey, soccer, basketball, martial arts, downhill snow sports and horseback riding). Your condition will get worse every time you experience a concussion. You should avoid these activities until you are evaluated by the appropriate follow-up health care  providers. Returning To Your Regular Activities You will need to return to your normal activities slowly, not all at once. You must give your body and brain enough time for recovery.  Do not return to sports or other athletic activities until your health care provider tells you it is safe to do so.  Ask your health care provider when you can drive, ride a bicycle, or operate heavy machinery. Your ability to react may be slower after a brain injury. Never do these activities if you are dizzy.  Ask your health care provider about when you can return to work or school. Preventing Another Concussion It is very important to avoid another brain injury, especially before you have recovered. In rare cases, another injury can lead to permanent brain damage, brain swelling, or death. The risk of this is greatest during the first 7-10 days after a head injury. Avoid injuries by:  Wearing a seat belt when riding in a car.  Drinking alcohol only in moderation.  Wearing a helmet when biking, skiing,  skateboarding, skating, or doing similar activities.  Avoiding activities that could lead to a second concussion, such as contact or recreational sports, until your health care provider says it is okay.  Taking safety measures in your home.  Remove clutter and tripping hazards from floors and stairways.  Use grab bars in bathrooms and handrails by stairs.  Place non-slip mats on floors and in bathtubs.  Improve lighting in dim areas. SEEK MEDICAL CARE IF:  You have increased problems paying attention or concentrating.  You have increased difficulty remembering or learning new information.  You need more time to complete tasks or assignments than before.  You have increased irritability or decreased ability to cope with stress.  You have more symptoms than before. Seek medical care if you have any of the following symptoms for more than 2 weeks after your injury:  Lasting (chronic)  headaches.  Dizziness or balance problems.  Nausea.  Vision problems.  Increased sensitivity to noise or light.  Depression or mood swings.  Anxiety or irritability.  Memory problems.  Difficulty concentrating or paying attention.  Sleep problems.  Feeling tired all the time. SEEK IMMEDIATE MEDICAL CARE IF:  You have severe or worsening headaches. These may be a sign of a blood clot in the brain.  You have weakness (even if only in one hand, leg, or part of the face).  You have numbness.  You have decreased coordination.  You vomit repeatedly.  You have increased sleepiness.  One pupil is larger than the other.  You have convulsions.  You have slurred speech.  You have increased confusion. This may be a sign of a blood clot in the brain.  You have increased restlessness, agitation, or irritability.  You are unable to recognize people or places.  You have neck pain.  It is difficult to wake you up.  You have unusual behavior changes.  You lose consciousness. MAKE SURE YOU:  Understand these instructions.  Will watch your condition.  Will get help right away if you are not doing well or get worse.   This information is not intended to replace advice given to you by your health care provider. Make sure you discuss any questions you have with your health care provider.   Document Released: 09/14/2003 Document Revised: 07/15/2014 Document Reviewed: 01/14/2013 Elsevier Interactive Patient Education 2016 [place], Adult You have received a head injury. It does not appear serious at this time. Headaches and vomiting are common following head injury. It should be easy to awaken from sleeping. Sometimes it is necessary for you to stay in the emergency department for a while for observation. Sometimes admission to the hospital may be needed. After injuries such as yours, most problems occur within the first 24 hours, but side effects may  occur up to 7-10 days after the injury. It is important for you to carefully monitor your condition and contact your health care provider or seek immediate medical care if there is a change in your condition. WHAT ARE THE TYPES OF HEAD INJURIES? Head injuries can be as minor as a bump. Some head injuries can be more severe. More severe head injuries include:  A jarring injury to the brain (concussion).  A bruise of the brain (contusion). This mean there is bleeding in the brain that can cause swelling.  A cracked skull (skull fracture).  Bleeding in the brain that collects, clots, and forms a bump (hematoma). WHAT CAUSES A HEAD INJURY? A serious head injury  is most likely to happen to someone who is in a car wreck and is not wearing a seat belt. Other causes of major head injuries include bicycle or motorcycle accidents, sports injuries, and falls. HOW ARE HEAD INJURIES DIAGNOSED? A complete history of the event leading to the injury and your current symptoms will be helpful in diagnosing head injuries. Many times, pictures of the brain, such as CT or MRI are needed to see the extent of the injury. Often, an overnight hospital stay is necessary for observation.  WHEN SHOULD I SEEK IMMEDIATE MEDICAL CARE?  You should get help right away if:  You have confusion or drowsiness.  You feel sick to your stomach (nauseous) or have continued, forceful vomiting.  You have dizziness or unsteadiness that is getting worse.  You have severe, continued headaches not relieved by medicine. Only take over-the-counter or prescription medicines for pain, fever, or discomfort as directed by your health care provider.  You do not have normal function of the arms or legs or are unable to walk.  You notice changes in the black spots in the center of the colored part of your eye (pupil).  You have a clear or bloody fluid coming from your nose or ears.  You have a loss of vision. During the next 24 hours  after the injury, you must stay with someone who can watch you for the warning signs. This person should contact local emergency services (911 in the [place] if you have seizures, you become unconscious, or you are unable to wake up. HOW CAN I PREVENT A HEAD INJURY IN THE FUTURE? The most important factor for preventing major head injuries is avoiding motor vehicle accidents. To minimize the potential for damage to your head, it is crucial to wear seat belts while riding in motor vehicles. Wearing helmets while bike riding and playing collision sports (like football) is also helpful. Also, avoiding dangerous activities around the house will further help reduce your risk of head injury.  WHEN CAN I RETURN TO NORMAL ACTIVITIES AND ATHLETICS? You should be reevaluated by your health care provider before returning to these activities. If you have any of the following symptoms, you should not return to activities or contact sports until 1 week after the symptoms have stopped:  Persistent headache.  Dizziness or vertigo.  Poor attention and concentration.  Confusion.  Memory problems.  Nausea or vomiting.  Fatigue or tire easily.  Irritability.  Intolerant of bright lights or loud noises.  Anxiety or depression.  Disturbed sleep. MAKE SURE YOU:   Understand these instructions.  Will watch your condition.  Will get help right away if you are not doing well or get worse.   This information is not intended to replace advice given to you by your health care provider. Make sure you discuss any questions you have with your health care provider.   Document Released: 06/24/2005 Document Revised: 07/15/2014 Document Reviewed: 03/01/2013 Elsevier Interactive Patient Education [employer].

## 2015-10-08 NOTE — ED Notes (Signed)
Transported to CT scan

## 2015-10-08 NOTE — Progress Notes (Signed)
   10/08/15 0325  Clinical Encounter Type  Visited With Family;Patient and family together;Health care provider  Visit Type Initial;ED;Trauma  Referral From Physician  Stress Factors  Family Stress Factors Health changes   Chaplain responded to a level I Trauma. Chaplain met with patient's husband, and helped him receive medical consultation and transition to patient's room. Chaplain offered support, extended hospitality, and our services are available as needed.   Jeri Lager, Chaplain 10/08/2015 3:26 AM

## 2015-10-08 NOTE — ED Notes (Signed)
Pt taken to restroom in wheelchair was able to void independently, given paper scrubs, able to dress herself.

## 2015-10-08 NOTE — Consult Note (Signed)
History   Jamie [name] is an 70 y.o. female.   Chief Complaint:  Chief Complaint  Patient presents with  . Fall    According to EMS, the patient's husband heard the patient fall down the stairs.  EMS said the patient and her husband had two drinks earlier.  Upon EMS arrival, the patient was a GCS-3, unresponsive no injuries seen.   EMS did not know the LSN time.  On transport, the patient was alert and following commands but could not communicate with EMS.  . Loss of Consciousness    Fall This is a new problem. The current episode started today. Associated symptoms comments: confusion.  Loss of Consciousness Pt is a 70 yo F who was brought by EMS after a fall.  She and her husband had 2-3 drinks each, and he went to sleep.  She fell sometime after that, and he woke up upon hearing her fall.  This was 1-2 flights of stairs.  He said that initially she was talking to him but then fell asleep very quickly. She was very confused and only moaning with EMS and upon arrival to the emergency department. She would open her eyes and move her extremities spontaneously, but did not say any intelligible words upon first arrival. She has not had any evidence of nausea or vomiting. There was no bruising to indicate what she hit.  She is not on any blood thinners.    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 Lung cancer Father   . Liver disease Neg Hx   . Heart disease Brother   . Asthma Neg Hx   . Hypertension Other   . Colon cancer Cousin   . Allergies Daughter   . Allergies Brother   . Melanoma Daughter        Social History: Drinks some alcohol.  Not daily. Has cut back.  Never smoker.     Allergies   Allergies  Allergen Reactions  . Morphine And Related Shortness Of Breath  .  Avelox [Moxifloxacin Hcl In Nacl] Hives  . Diflucan [Fluconazole] Hives  Aspirin; Diflucan; Latex; and Moxifloxacin  Home Medications   aspirin EC 81 MG tablet    BIOTIN PO   butalbital-acetaminophen-caffeine (FIORICET) 50-325-40 MG tablet   clidinium-chlordiazePOXIDE (LIBRAX) 5-2.5 MG capsule   clonazePAM (KLONOPIN) 1 MG tablet   diphenhydrAMINE (BENADRYL) 25 MG tablet   esomeprazole (NEXIUM) 40 MG capsule   fluticasone (FLONASE) 50 MCG/ACT nasal spray   Linaclotide (LINZESS) 145 MCG CAPS capsule   metaxalone (SKELAXIN) 800 MG tablet   metoprolol succinate (TOPROL-XL) 100 MG 24 hr tablet   MILK THISTLE PO   Multiple Vitamin (MULTIVITAMIN WITH MINERALS) TABS   olmesartan (BENICAR) 40 MG tablet   ondansetron (ZOFRAN) 4 MG tablet   spironolactone (ALDACTONE) 50 MG tablet         Trauma Course   Results for orders placed or performed during the hospital encounter of 10/08/15 (from the past 48 hour(s))  Type and screen     Status: None   Collection Time: 10/08/15  2:37 AM  Result Value Ref Range   ABO/RH(D) A POS    Antibody Screen NEG    Sample Expiration 10/11/2015    Unit Number [id number]    Blood Component Type RBC LR PHER2    Unit division 00  Status of Unit REL FROM [facility]    Unit tag comment VERBAL ORDERS PER DR WARD    Transfusion Status OK TO TRANSFUSE    Crossmatch Result NOT NEEDED    Unit Number [id number]    Blood Component Type RED CELLS,LR    Unit division 00    Status of Unit REL FROM [facility]    Unit tag comment VERBAL ORDERS PER DR WARD    Transfusion Status OK TO TRANSFUSE    Crossmatch Result NOT NEEDED   Prepare fresh frozen plasma     Status: None   Collection Time: 10/08/15  2:37 AM  Result Value Ref Range   Unit Number [id number]    Blood Component Type THAWED PLASMA    Unit division 00    Status of Unit REL FROM [facility]    Unit tag comment VERBAL ORDERS PER DR WARD    Transfusion Status OK TO TRANSFUSE    Unit Number  [id number]    Blood Component Type THAWED PLASMA    Unit division 00    Status of Unit REL FROM [facility]    Unit tag comment VERBAL ORDERS PER DR WARD    Transfusion Status OK TO TRANSFUSE   CDS serology     Status: None   Collection Time: 10/08/15  2:37 AM  Result Value Ref Range   CDS serology specimen      SPECIMEN WILL BE HELD FOR 14 DAYS IF TESTING IS REQUIRED  Comprehensive metabolic panel     Status: Abnormal   Collection Time: 10/08/15  2:37 AM  Result Value Ref Range   Sodium 138 135 - 145 mmol/L   Potassium 4.0 3.5 - 5.1 mmol/L   Chloride 105 101 - 111 mmol/L   CO2 18 (L) 22 - 32 mmol/L   Glucose, Bld 114 (H) 65 - 99 mg/dL   BUN 14 6 - 20 mg/dL   Creatinine, Ser 0.85 0.44 - 1.00 mg/dL   Calcium 9.1 8.9 - 10.3 mg/dL   Total Protein 6.7 6.5 - 8.1 g/dL   Albumin 3.9 3.5 - 5.0 g/dL   AST 69 (H) 15 - 41 U/L   ALT 117 (H) 14 - 54 U/L   Alkaline Phosphatase 56 38 - 126 U/L   Total Bilirubin 0.5 0.3 - 1.2 mg/dL   GFR calc non Af Amer >60 >60 mL/min   GFR calc Af Amer >60 >60 mL/min    Comment: (NOTE) The eGFR has been calculated using the CKD EPI equation. This calculation has not been validated in all clinical situations. eGFR's persistently <60 mL/min signify possible Chronic Kidney Disease.    Anion gap 15 5 - 15  CBC     Status: None   Collection Time: 10/08/15  2:37 AM  Result Value Ref Range   WBC 8.4 4.0 - 10.5 K/uL   RBC 4.25 3.87 - 5.11 MIL/uL   Hemoglobin 13.1 12.0 - 15.0 g/dL   HCT 40.2 36.0 - 46.0 %   MCV 94.6 78.0 - 100.0 fL   MCH 30.8 26.0 - 34.0 pg   MCHC 32.6 30.0 - 36.0 g/dL   RDW 12.8 11.5 - 15.5 %   Platelets 210 150 - 400 K/uL  Ethanol     Status: Abnormal   Collection Time: 10/08/15  2:37 AM  Result Value Ref Range   Alcohol, Ethyl (B) 248 (H) <5 mg/dL    Comment:        LOWEST DETECTABLE LIMIT FOR SERUM ALCOHOL IS 5  mg/dL FOR MEDICAL PURPOSES ONLY   Protime-INR     Status: None   Collection Time: 10/08/15  2:37 AM  Result Value Ref  Range   Prothrombin Time 13.4 11.6 - 15.2 seconds   INR 1.00 0.00 - 1.49  ABO/Rh     Status: None (Preliminary result)   Collection Time: 10/08/15  2:37 AM  Result Value Ref Range   ABO/RH(D) A POS   Urine rapid drug screen (hosp performed)not at [facility]     Status: Abnormal   Collection Time: 10/08/15  5:05 AM  Result Value Ref Range   Opiates NONE DETECTED NONE DETECTED   Cocaine NONE DETECTED NONE DETECTED   Benzodiazepines POSITIVE (A) NONE DETECTED   Amphetamines NONE DETECTED NONE DETECTED   Tetrahydrocannabinol NONE DETECTED NONE DETECTED   Barbiturates NONE DETECTED NONE DETECTED    Comment:        DRUG SCREEN FOR MEDICAL PURPOSES ONLY.  IF CONFIRMATION IS NEEDED FOR ANY PURPOSE, NOTIFY LAB WITHIN 5 DAYS.        LOWEST DETECTABLE LIMITS FOR URINE DRUG SCREEN Drug Class       Cutoff (ng/mL) Amphetamine      1000 Barbiturate      200 Benzodiazepine   101 Tricyclics       751 Opiates          300 Cocaine          300 THC              50   Urinalysis, Routine w reflex microscopic (not at [facility])     Status: Abnormal   Collection Time: 10/08/15  5:05 AM  Result Value Ref Range   Color, Urine STRAW (A) YELLOW   APPearance CLEAR CLEAR   Specific Gravity, Urine <1.005 (L) 1.005 - 1.030   pH 6.0 5.0 - 8.0   Glucose, UA NEGATIVE NEGATIVE mg/dL   Hgb urine dipstick NEGATIVE NEGATIVE   Bilirubin Urine NEGATIVE NEGATIVE   Ketones, ur NEGATIVE NEGATIVE mg/dL   Protein, ur NEGATIVE NEGATIVE mg/dL   Nitrite NEGATIVE NEGATIVE   Leukocytes, UA NEGATIVE NEGATIVE    Comment: MICROSCOPIC NOT DONE ON URINES WITH NEGATIVE PROTEIN, BLOOD, LEUKOCYTES, NITRITE, OR GLUCOSE <1000 mg/dL.   Ct Head Wo Contrast  10/08/2015  CLINICAL DATA:  Fall. Unresponsive. Ethanol use earlier this evening/this morning. EXAM: CT HEAD WITHOUT CONTRAST CT CERVICAL SPINE WITHOUT CONTRAST TECHNIQUE: Multidetector CT imaging of the head and cervical spine was performed following the standard protocol without  intravenous contrast. Multiplanar CT image reconstructions of the cervical spine were also generated. COMPARISON:  None. FINDINGS: CT HEAD FINDINGS Sinuses/Soft tissues: No significant soft tissue swelling. Clear paranasal sinuses and mastoid air cells. No skull fracture. Intracranial: Cerebral atrophy. No mass lesion, hemorrhage, hydrocephalus, acute infarct, intra-axial, or extra-axial fluid collection. CT CERVICAL SPINE FINDINGS Spinal visualization through the bottom of T3. Prevertebral soft tissues are within normal limits. From C6 inferiorly are suboptimally evaluated secondary to patient body habitus and overlying soft tissues. No apical pneumothorax. Cervical spondylosis, with areas of central canal and bilateral neural foraminal narrowing at C5-6, C6-7. Skull base intact. Maintenance of vertebral body height. Straightening of expected cervical lordosis. Facets are well-aligned. Coronal reformats demonstrate a normal C1-C2 articulation. IMPRESSION: 1.  No acute intracranial abnormality. 2. Cervical spondylosis, without acute fracture or subluxation. 3. Suboptimal evaluation from C6 inferiorly secondary to patient size and overlying soft tissues. 4. Straightening of expected cervical lordosis could be positional, due to muscular spasm, or ligamentous  injury. Electronically Signed   By: Abigail Miyamoto M.D.   On: 10/08/2015 03:24   Ct Chest W Contrast  10/08/2015  CLINICAL DATA:  Fall down steps.  Unresponsive.  Ethanol use. EXAM: CT CHEST, ABDOMEN, AND PELVIS WITH CONTRAST TECHNIQUE: Multidetector CT imaging of the chest, abdomen and pelvis was performed following the standard protocol during bolus administration of intravenous contrast. CONTRAST:  138m ISOVUE-300 IOPAMIDOL (ISOVUE-300) INJECTION 61% COMPARISON:  Plain films of the chest and pelvis of earlier today. FINDINGS: CT CHEST FINDINGS Mediastinum/Lymph Nodes: No evidence of aortic laceration or mediastinal hematoma. Mild cardiomegaly, without  pericardial effusion. Lad coronary artery atherosclerosis, including on image 23. Calcified mediastinal nodes, likely related to old granulomatous disease. No hilar adenopathy. Lungs/Pleura: No pleural fluid. No pneumothorax. Right middle lobe calcified granuloma. Minimal dependent bibasilar atelectasis. Musculoskeletal: No acute osseous abnormality. Convex right thoracic spine curvature. CT ABDOMEN PELVIS FINDINGS Hepatobiliary: Minimal motion degradation. Moderate hepatic steatosis and hepatomegaly, on the order of 19 cm craniocaudal. Normal gallbladder, without biliary ductal dilatation. Pancreas: Normal, without mass or ductal dilatation. Spleen: Normal in size, without focal abnormality. Adrenals/Urinary Tract: Normal adrenal glands. Posterior upper/inter pole right renal volume loss, consistent with partial nephrectomy. Too small to characterize bilateral renal lesions, without hydronephrosis or hydroureter. Normal urinary bladder. Stomach/Bowel: Normal stomach, without wall thickening. Scattered colonic diverticula. Normal terminal ileum and appendix. Normal small bowel. Vascular/Lymphatic: Normal caliber of the aorta and branch vessels. No abdominopelvic adenopathy. Reproductive: Hysterectomy.  No adnexal mass. Other: No significant free fluid. Tiny bilateral fat containing inguinal hernias. Musculoskeletal: No acute osseous abnormality. Facet arthropathy, especially at L4-L5. IMPRESSION: 1. No acute or posttraumatic deformity identified. 2. Hepatic steatosis and hepatomegaly. 3. Partial right nephrectomy, without evidence of metastatic disease. Electronically Signed   By: KAbigail MiyamotoM.D.   On: 10/08/2015 03:43   Ct Cervical Spine Wo Contrast  10/08/2015  CLINICAL DATA:  Fall. Unresponsive. Ethanol use earlier this evening/this morning. EXAM: CT HEAD WITHOUT CONTRAST CT CERVICAL SPINE WITHOUT CONTRAST TECHNIQUE: Multidetector CT imaging of the head and cervical spine was performed following the standard  protocol without intravenous contrast. Multiplanar CT image reconstructions of the cervical spine were also generated. COMPARISON:  None. FINDINGS: CT HEAD FINDINGS Sinuses/Soft tissues: No significant soft tissue swelling. Clear paranasal sinuses and mastoid air cells. No skull fracture. Intracranial: Cerebral atrophy. No mass lesion, hemorrhage, hydrocephalus, acute infarct, intra-axial, or extra-axial fluid collection. CT CERVICAL SPINE FINDINGS Spinal visualization through the bottom of T3. Prevertebral soft tissues are within normal limits. From C6 inferiorly are suboptimally evaluated secondary to patient body habitus and overlying soft tissues. No apical pneumothorax. Cervical spondylosis, with areas of central canal and bilateral neural foraminal narrowing at C5-6, C6-7. Skull base intact. Maintenance of vertebral body height. Straightening of expected cervical lordosis. Facets are well-aligned. Coronal reformats demonstrate a normal C1-C2 articulation. IMPRESSION: 1.  No acute intracranial abnormality. 2. Cervical spondylosis, without acute fracture or subluxation. 3. Suboptimal evaluation from C6 inferiorly secondary to patient size and overlying soft tissues. 4. Straightening of expected cervical lordosis could be positional, due to muscular spasm, or ligamentous injury. Electronically Signed   By: KAbigail MiyamotoM.D.   On: 10/08/2015 03:24   Ct Abdomen Pelvis W Contrast  10/08/2015  CLINICAL DATA:  Fall down steps.  Unresponsive.  Ethanol use. EXAM: CT CHEST, ABDOMEN, AND PELVIS WITH CONTRAST TECHNIQUE: Multidetector CT imaging of the chest, abdomen and pelvis was performed following the standard protocol during bolus administration of intravenous contrast. CONTRAST:  1074m  ISOVUE-300 IOPAMIDOL (ISOVUE-300) INJECTION 61% COMPARISON:  Plain films of the chest and pelvis of earlier today. FINDINGS: CT CHEST FINDINGS Mediastinum/Lymph Nodes: No evidence of aortic laceration or mediastinal hematoma. Mild  cardiomegaly, without pericardial effusion. Lad coronary artery atherosclerosis, including on image 23. Calcified mediastinal nodes, likely related to old granulomatous disease. No hilar adenopathy. Lungs/Pleura: No pleural fluid. No pneumothorax. Right middle lobe calcified granuloma. Minimal dependent bibasilar atelectasis. Musculoskeletal: No acute osseous abnormality. Convex right thoracic spine curvature. CT ABDOMEN PELVIS FINDINGS Hepatobiliary: Minimal motion degradation. Moderate hepatic steatosis and hepatomegaly, on the order of 19 cm craniocaudal. Normal gallbladder, without biliary ductal dilatation. Pancreas: Normal, without mass or ductal dilatation. Spleen: Normal in size, without focal abnormality. Adrenals/Urinary Tract: Normal adrenal glands. Posterior upper/inter pole right renal volume loss, consistent with partial nephrectomy. Too small to characterize bilateral renal lesions, without hydronephrosis or hydroureter. Normal urinary bladder. Stomach/Bowel: Normal stomach, without wall thickening. Scattered colonic diverticula. Normal terminal ileum and appendix. Normal small bowel. Vascular/Lymphatic: Normal caliber of the aorta and branch vessels. No abdominopelvic adenopathy. Reproductive: Hysterectomy.  No adnexal mass. Other: No significant free fluid. Tiny bilateral fat containing inguinal hernias. Musculoskeletal: No acute osseous abnormality. Facet arthropathy, especially at L4-L5. IMPRESSION: 1. No acute or posttraumatic deformity identified. 2. Hepatic steatosis and hepatomegaly. 3. Partial right nephrectomy, without evidence of metastatic disease. Electronically Signed   By: Abigail Miyamoto M.D.   On: 10/08/2015 03:43   Dg Pelvis Portable  10/08/2015  CLINICAL DATA:  Status post fall down flight of stairs. Concern for pelvic injury. Initial encounter. EXAM: PORTABLE PELVIS 1-2 VIEWS COMPARISON:  None. FINDINGS: There is no evidence of fracture or dislocation. Both femoral heads are seated  normally within their respective acetabula. No significant degenerative change is appreciated. The sacroiliac joints are unremarkable in appearance. The visualized bowel gas pattern is grossly unremarkable in appearance. IMPRESSION: No evidence of fracture or dislocation. Electronically Signed   By: Garald Balding M.D.   On: 10/08/2015 02:59   Dg Chest Port 1 View  10/08/2015  CLINICAL DATA:  Status post fall down flight of stairs. Concern for chest injury. Initial encounter. EXAM: PORTABLE CHEST 1 VIEW COMPARISON:  None. FINDINGS: The lungs are hypoexpanded. Vascular crowding is noted. Mild left basilar atelectasis is seen. There is no evidence of pleural effusion or pneumothorax. The cardiomediastinal silhouette is within normal limits. No acute osseous abnormalities are seen. IMPRESSION: Lungs hypoexpanded. Mild left basilar atelectasis noted. No displaced rib fracture seen. Electronically Signed   By: Garald Balding M.D.   On: 10/08/2015 03:00   Dg Shoulder Left  10/08/2015  CLINICAL DATA:  Acute left shoulder pain after fall down steps. Initial encounter. EXAM: LEFT SHOULDER - 2+ VIEW COMPARISON:  None. FINDINGS: There is no evidence of fracture or dislocation. There is no evidence of arthropathy or other focal bone abnormality. Soft tissues are unremarkable. IMPRESSION: Normal left shoulder. Electronically Signed   By: Marijo Conception, M.D.   On: 10/08/2015 07:25    Review of Systems  Unable to perform ROS: mental status change  Cardiovascular: Positive for syncope.    Blood pressure 102/49, pulse 92, resp. rate 18, height _0  (1.626 m), weight 95.255 kg (210 lb), SpO2 100 %. Physical Exam  Constitutional: She appears well-developed and well-nourished. She appears distressed.  HENT:  Head: Normocephalic and atraumatic.  Right Ear: External ear normal.  Left Ear: External ear normal.  Mouth/Throat: No oropharyngeal exudate.  Eyes: Conjunctivae are normal. Pupils are equal, round, and  reactive to light. Right eye exhibits no discharge. Left eye exhibits no discharge. No scleral icterus.  Neck: Neck supple. No tracheal deviation present. No thyromegaly present.  Cardiovascular: Normal rate, regular rhythm, normal heart sounds and intact distal pulses.   Respiratory: Effort normal and breath sounds normal. No respiratory distress. She exhibits no tenderness.  GI: Soft. She exhibits no distension and no mass. There is no tenderness. There is no rebound and no guarding.  Musculoskeletal: Normal range of motion. She exhibits no edema or tenderness.  Stable pelvis  Neurological: She is alert. Coordination normal.  Skin: Skin is warm and dry. No rash noted. She is not diaphoretic. No erythema.  Psychiatric:  At first, patient kept eyes closed with some moaning.  Held tight to oxygen mask on face.  Always moving all 4 extremities.  Got agitated and was pointing and seemed to be trying to communicate.  Eventually after being in hospital for around 1 hour, patient was able to repeat sentences, but did not make any sense.  Occasionally would open eyes or move extremity to command.        Assessment/Plan Fall ? Concussion EtOH intoxication Await drug screen.   HTN  Will see if mental status continues to improve.   May need observation admit for possible concussion. It is possible that the confusion represented some sort of migraine aura or TIA, however, it appears to be improving.    ED to work with patient and will let us know if she needs observation.    Traylen Eckels 10/08/2015, 7:31 AM

## 2015-10-08 NOTE — ED Notes (Signed)
Patient transported to CT 

## 2015-10-08 NOTE — ED Provider Notes (Signed)
By signing my name below, I, Evelene Croon, attest that this documentation has been prepared under the direction and in the presence of [place], DO . Electronically Signed: Evelene Croon, Scribe. 10/08/2015. 2:34 AM.  TIME SEEN: 2:15 AM   CHIEF COMPLAINT:  Chief Complaint  Patient presents with  . Fall    According to EMS, the patient    LEVEL 5 CAVEAT DUE TO ACUITY OF CONDITION  HPI:   Jamie [name] is a 70 y.o. female with a history of HTN, TIA and obstructive sleep apnea, who presents to the Emergency Department via EMS s/p fall this AM. Per EMS, pt fell down several steps ~ 0125. Pt's husband heard her fall but did not witness it. EMS states upon their arrival to pt she was unresponsive with GCS of 3. She was placed on O2 and responsiveness improved slightly; pt spoke "please, don't hurt me" en route. She has not spoken again since. Pt was last known well ~ 2300 last night. From ~2300 to ~ 0100 pt and husband were drinking ETOH. He states that he thinks that she had 3 or more cocktails. Pt takes ASA daily; unknown dose. No anticoagulation. He states that she was acting normally before he went to bed at 1 AM.   ROS: LEVEL 5 CAVEAT DUE TO ACUITY OF CONDITION  PAST MEDICAL HISTORY/PAST SURGICAL HISTORY:  History reviewed. No pertinent past medical history.  MEDICATIONS:  Prior to Admission medications   Not on File    ALLERGIES:  Not on File  SOCIAL HISTORY:  Social History  Substance Use Topics  . Smoking status: Not on file  . Smokeless tobacco: Not on file  . Alcohol Use: Not on file    FAMILY HISTORY: No family history on file.  EXAM: BP 97/55 mmHg  Pulse 96  Resp 20  Ht 5' 4"  (1.626 m)  Wt 210 lb (95.255 kg)  BMI 36.03 kg/m2  SpO2 96% CONSTITUTIONAL: Alert; Obese; GCS 9; Open eyes to commands but does not answer questions or follow commands; patient is protecting her airway HEAD: Normocephalic; atraumatic EYES: Conjunctivae clear, PERRL, EOMI  ENT:  normal nose; no rhinorrhea; moist mucous membranes; pharynx without lesions noted; no dental injury; no septal hematoma NECK: Supple, no meningismus, no LAD; no midline spinal tenderness, step-off or deformity CARD: RRR; S1 and S2 appreciated; no murmurs, no clicks, no rubs, no gallops RESP: Normal chest excursion without splinting or tachypnea; breath sounds clear and equal bilaterally; no wheezes, no rhonchi, no rales; no hypoxia or respiratory distress CHEST:  chest wall stable, no crepitus or ecchymosis or deformity, nontender to palpation ABD/GI: Normal bowel sounds; non-distended; soft, non-tender, no rebound, no guarding PELVIS:  stable, nontender to palpation BACK:  No obvious injury to back; ecchymosis noted to buttocks.The back appears normal and is non-tender to palpation, there is no CVA tenderness; no midline spinal tenderness, step-off or deformity EXT: Normal ROM in all joints; non-tender to palpation; no edema; normal capillary refill; no cyanosis, no bony tenderness or bony deformity of patient's extremities, no joint effusion, no ecchymosis or lacerations    SKIN: Normal color for age and race; warm. Small area of ecchymosis to distal left leg and abrasion to right forearm.  NEURO: Moves all extremities;  GCS 9; Open eyes to commands but does not answer questions or follow commands    EKG Interpretation  Date/Time:  Sunday October 08 2015 02:24:54 EDT Ventricular Rate:  79 PR Interval:  174 QRS Duration: 109 QT  Interval:  416 QTC Calculation: 477 R Axis:   37 Text Interpretation:  Sinus rhythm Incomplete left bundle branch block Inferior infarct, old No old tracing to compare Confirmed by WARD,  DO, KRISTEN ([id number]) on 10/08/2015 2:39:21 AM       MEDICAL DECISION MAKING: Patient here as a level I trauma for a fall downstairs was unresponsive. Dr. Barry Dienes at bedside. Patient is hemodynamically stable. Will open her eyes but does not follow commands or answer questions. She smells  of alcohol. Will obtain CT scans of her head, cervical spine, chest, abdomen and pelvis. Portable x-rays of her chest and pelvis are unremarkable. We'll hydrate patient. Patient's husband is on his way.  ED PROGRESS: Patient's husband reports that she was normal at 1 AM when he went to bed. States he heard her fall down the stairs and found her at the bottom of the stairs. States she was talking normally initially but then seemed to "fall asleep". Scans that showed no acute abnormality. Her alcohol level is greater than 240. Suspect this along with postconcussive symptoms are the reason for her altered behavior. She is not talking normally and oriented 3. Denies any complaints. We'll continue to monitor closely until clinically sober.  8:00 AM  Pt has been monitored closely. She is able to ambulate without difficulty. Reports that she thinks that she had vertigo which caused her to fall. States she has had a history of vertigo in the past. No vertigo currently. Drinking without difficulty. Have offered her admission for observation but she strongly wants to go home. Husband at bedside agrees with this planning to watch her closely. I'm comfortable with this plan and she is now neurologically intact with a GCS of 15. Discussed with her head injury return precautions, postconcussive symptoms. We'll discharge with Vicodin as I suspect she will be more cervical neck few days and she is now. Advised her not to drink alcohol when taking this medication or drive a car when taking this medication. Have recommended that she rest for the next 48 hours and have no strenuous activity. She did complain of some pain in the left shoulder and I have x-rayed the shoulder which was negative. We have removed her cervical collar is her C-spine is cleared clinically. Patient and husband are comfortable with this plan.    At this time, I do not feel there is any life-threatening condition present. I have reviewed and discussed  all results (EKG, imaging, lab, urine as appropriate), exam findings with patient. I have reviewed nursing notes and appropriate previous records.  I feel the patient is safe to be discharged home without further emergent workup. Discussed usual and customary return precautions. Patient and family (if present) verbalize understanding and are comfortable with this plan.  Patient will follow-up with their primary care provider. If they do not have a primary care provider, information for follow-up has been provided to them. All questions have been answered.    CRITICAL CARE Performed by: Delice Bison Ward, DO Total critical care time: 45 minutes Critical care time was exclusive of separately billable procedures and treating other patients. Critical care was necessary to treat or prevent imminent or life-threatening deterioration. Critical care was time spent personally by me on the following activities: development of treatment plan with patient and/or surrogate as well as nursing, discussions with consultants, evaluation of patient's response to treatment, examination of patient, obtaining history from patient or surrogate, ordering and performing treatments and interventions, ordering and review of laboratory studies,  ordering and review of radiographic studies, pulse oximetry and re-evaluation of patient's condition.      I personally performed the services described in this documentation, which was scribed in my presence. The recorded information has been reviewed and is accurate.     [place], DO 10/08/15 [phone]

## 2015-10-09 ENCOUNTER — Encounter: Payer: Self-pay | Admitting: Neurology

## 2015-10-31 ENCOUNTER — Telehealth: Payer: Self-pay | Admitting: Neurology

## 2015-10-31 NOTE — Telephone Encounter (Signed)
Jamie [name], I see that you have faxed the information for the urogynecology referral to Dr. Festus Aloe. Pt has not heard back from them. Can you follow up on this?

## 2015-10-31 NOTE — Telephone Encounter (Signed)
Pt called to check on referral to a neurologic gynecologist. Please call pt to update. [phone]

## 2015-11-01 NOTE — Telephone Encounter (Signed)
Called patient left her a message about  Referral patient is already seeing two doctors at there practice. If patient's calls I will talk to her . Dr. Festus Aloe office is requesting a telephone call from patient.

## 2015-11-01 NOTE — Telephone Encounter (Signed)
Called and spoke to patient she relayed to me she want's to see a Women .  Patient is scheduled with Dr. Maryland Pink at [facility] June 14 th arrive at 1:45 pm for 2:00 apt. Telephone [phone] fax [phone]. Patient was offered a sooner apt but she could go. Patient was happy with results.

## 2015-11-03 DIAGNOSIS — G43909 Migraine, unspecified, not intractable, without status migrainosus: Secondary | ICD-10-CM | POA: Diagnosis not present

## 2015-11-03 DIAGNOSIS — I10 Essential (primary) hypertension: Secondary | ICD-10-CM | POA: Diagnosis not present

## 2015-11-03 DIAGNOSIS — Z8673 Personal history of transient ischemic attack (TIA), and cerebral infarction without residual deficits: Secondary | ICD-10-CM | POA: Diagnosis not present

## 2015-11-03 DIAGNOSIS — H8112 Benign paroxysmal vertigo, left ear: Secondary | ICD-10-CM | POA: Diagnosis not present

## 2015-11-03 DIAGNOSIS — R2689 Other abnormalities of gait and mobility: Secondary | ICD-10-CM | POA: Diagnosis not present

## 2015-11-20 ENCOUNTER — Ambulatory Visit (HOSPITAL_COMMUNITY)
Admission: RE | Admit: 2015-11-20 | Discharge: 2015-11-20 | Disposition: A | Discharge status: Home / Self Care | Payer: Medicare Other | Source: Ambulatory Visit | Attending: Urology | Admitting: Urology

## 2015-11-20 ENCOUNTER — Other Ambulatory Visit: Payer: Self-pay | Admitting: Urology

## 2015-11-20 DIAGNOSIS — C641 Malignant neoplasm of right kidney, except renal pelvis: Secondary | ICD-10-CM | POA: Diagnosis not present

## 2015-11-20 DIAGNOSIS — Z85528 Personal history of other malignant neoplasm of kidney: Secondary | ICD-10-CM | POA: Diagnosis not present

## 2015-11-20 DIAGNOSIS — Z8553 Personal history of malignant neoplasm of renal pelvis: Secondary | ICD-10-CM | POA: Diagnosis not present

## 2015-11-24 ENCOUNTER — Telehealth: Payer: Self-pay | Admitting: Internal Medicine

## 2015-11-24 MED ORDER — OMEPRAZOLE 40 MG PO CPDR: 40.0000 mg | DELAYED_RELEASE_CAPSULE | Freq: Every day | ORAL | Status: DC

## 2015-11-24 NOTE — Telephone Encounter (Signed)
Sent 90 day supply of Omeprazole to [facility]

## 2015-11-28 DIAGNOSIS — Z881 Allergy status to other antibiotic agents status: Secondary | ICD-10-CM | POA: Diagnosis not present

## 2015-11-28 DIAGNOSIS — R2689 Other abnormalities of gait and mobility: Secondary | ICD-10-CM | POA: Diagnosis not present

## 2015-11-28 DIAGNOSIS — Z885 Allergy status to narcotic agent status: Secondary | ICD-10-CM | POA: Diagnosis not present

## 2015-11-28 DIAGNOSIS — H8112 Benign paroxysmal vertigo, left ear: Secondary | ICD-10-CM | POA: Diagnosis not present

## 2015-11-28 DIAGNOSIS — G43909 Migraine, unspecified, not intractable, without status migrainosus: Secondary | ICD-10-CM | POA: Diagnosis not present

## 2015-11-28 DIAGNOSIS — Z886 Allergy status to analgesic agent status: Secondary | ICD-10-CM | POA: Diagnosis not present

## 2015-12-20 DIAGNOSIS — R350 Frequency of micturition: Secondary | ICD-10-CM | POA: Diagnosis not present

## 2015-12-20 DIAGNOSIS — N3642 Intrinsic sphincter deficiency (ISD): Secondary | ICD-10-CM | POA: Diagnosis not present

## 2016-01-03 ENCOUNTER — Ambulatory Visit: Payer: Medicare Other | Admitting: Neurology

## 2016-01-19 DIAGNOSIS — R350 Frequency of micturition: Secondary | ICD-10-CM | POA: Diagnosis not present

## 2016-01-19 DIAGNOSIS — R32 Unspecified urinary incontinence: Secondary | ICD-10-CM | POA: Diagnosis not present

## 2016-01-19 DIAGNOSIS — N393 Stress incontinence (female) (male): Secondary | ICD-10-CM | POA: Diagnosis not present

## 2016-01-19 DIAGNOSIS — N3642 Intrinsic sphincter deficiency (ISD): Secondary | ICD-10-CM | POA: Diagnosis not present

## 2016-01-25 ENCOUNTER — Ambulatory Visit: Payer: Medicare Other | Admitting: Nurse Practitioner

## 2016-03-22 DIAGNOSIS — Z23 Encounter for immunization: Secondary | ICD-10-CM | POA: Diagnosis not present

## 2016-04-12 DIAGNOSIS — J019 Acute sinusitis, unspecified: Secondary | ICD-10-CM | POA: Diagnosis not present

## 2016-04-12 DIAGNOSIS — L918 Other hypertrophic disorders of the skin: Secondary | ICD-10-CM | POA: Diagnosis not present

## 2016-06-19 DIAGNOSIS — I10 Essential (primary) hypertension: Secondary | ICD-10-CM | POA: Diagnosis not present

## 2016-06-19 DIAGNOSIS — Z0000 Encounter for general adult medical examination without abnormal findings: Secondary | ICD-10-CM | POA: Diagnosis not present

## 2016-06-19 DIAGNOSIS — R5383 Other fatigue: Secondary | ICD-10-CM | POA: Diagnosis not present

## 2016-06-19 DIAGNOSIS — R739 Hyperglycemia, unspecified: Secondary | ICD-10-CM | POA: Diagnosis not present

## 2016-06-20 DIAGNOSIS — E669 Obesity, unspecified: Secondary | ICD-10-CM | POA: Diagnosis not present

## 2016-06-20 DIAGNOSIS — G4733 Obstructive sleep apnea (adult) (pediatric): Secondary | ICD-10-CM | POA: Diagnosis not present

## 2016-06-20 DIAGNOSIS — I10 Essential (primary) hypertension: Secondary | ICD-10-CM | POA: Diagnosis not present

## 2016-08-13 ENCOUNTER — Ambulatory Visit (INDEPENDENT_AMBULATORY_CARE_PROVIDER_SITE_OTHER): Payer: Medicare Other | Admitting: Orthopedic Surgery

## 2016-08-13 ENCOUNTER — Ambulatory Visit (INDEPENDENT_AMBULATORY_CARE_PROVIDER_SITE_OTHER): Payer: Medicare Other

## 2016-08-13 ENCOUNTER — Encounter (INDEPENDENT_AMBULATORY_CARE_PROVIDER_SITE_OTHER): Payer: Self-pay | Admitting: Orthopedic Surgery

## 2016-08-13 DIAGNOSIS — M25472 Effusion, left ankle: Secondary | ICD-10-CM

## 2016-08-13 DIAGNOSIS — M25572 Pain in left ankle and joints of left foot: Secondary | ICD-10-CM

## 2016-08-13 NOTE — Progress Notes (Signed)
Office Visit Note   Patient: Jamie [name]           Date of Birth: [birth year]           MRN: [id number] Visit Date: 08/13/2016 Requested by: Deland Pretty, MD [address] [place], [place] [address] PCP: Horatio Pel, MD  Subjective: Chief Complaint  Patient presents with  . Left Ankle - Pain, Injury  . Left Foot - Pain, Injury    HPI patient is a 71 year old patient who fell Thursday night to 118.  She has sleep apnea and doesn't remember the event.  She think she may have sustained an inversion injury to her left ankle.  She had a fracture boot at home which she has been utilizing.  She is concerned because she can't move all of her toes.  She did develop some bruising.  She is taking ibuprofen 800 mg every 12 hours.  She is not a smoker no recent travel              Review of Systems All systems reviewed are negative as they relate to the chief complaint within the history of present illness.  Patient denies  fevers or chills.    Assessment & Plan: Visit Diagnoses:  1. Pain in left ankle and joints of left foot   2. Pain and swelling of left ankle     Plan: Impression is likely left ankle sprain with bruising ecchymosis.  No fracture is present.  No syndesmotic instability is noted.  Forefoot is stable so I do not think there is been tarsometatarsal joint injury.  Plan is ASO for 3-4 weeks.  We talked about physical therapy but she wants to hold off on that.  I'm a she's fine weight-bear as tolerated in an ASO.  We tried a fracture boot but she felt like it was too big.  If he is not improving within the next 3-4 weeks she should come back and I'll reevaluate her likely send her to formal physical therapy  Follow-Up Instructions: No Follow-up on file.   Orders:  Orders Placed This Encounter  Procedures  . XR Foot Complete Left  . XR Ankle 2 Views Left   No orders of the defined types were placed in this encounter.     Procedures: No  procedures performed   Clinical Data: No additional findings.  Objective: Vital Signs: There were no vitals taken for this visit.  Physical Exam   Constitutional: Patient appears well-developed HEENT:  Head: Normocephalic Eyes:EOM are normal Neck: Normal range of motion Cardiovascular: Normal rate Pulmonary/chest: Effort normal Neurologic: Patient is alert Skin: Skin is warm Psychiatric: Patient has normal mood and affect    Ortho Exam examination of the left foot demonstrates ecchymosis intact ankle dorsiflexion plantar flexion inversion and eversion.  Pedal pulses palpable.  Sensation intact dorsal plantar foot.  Compartments soft.  EHL is intact FHL is intact she does have max tenderness over the ATFL and CFL.  Also some tenderness around the meta-diaphysis of the lateral malleolus but no fractures present in this area.  There is no pain with pronation supination of the forefoot.  No tenderness to direct palpation of any of the metatarsals including the base of fifth.  Specialty Comments:  No specialty comments available.  Imaging: No results found.   PMFS History: Patient Active Problem List   Diagnosis Date Noted  . Pain in left ankle and joints of left foot 08/13/2016  . Pain and swelling of  left ankle 08/13/2016  . At risk for polypharmacy 10/02/2015  . Intractable migraine with aura with status migrainosus 10/02/2015  . Other specified transient cerebral ischemias 09/16/2014  . 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 Morbid obesity ([place]) 09/16/2014  . Headache 05/03/2013  . Preop exam for internal medicine 03/10/2012  . Kidney mass 03/10/2012  . UTI (lower urinary tract infection) 02/13/2012  . Excessive tearing 12/16/2011  . Leg pain 09/20/2011  . Acute bronchitis 06/27/2011  . Wheezing 06/27/2011  . Rosacea 05/07/2011  . Shoulder pain, right 05/07/2011  . OSA (obstructive sleep apnea) 01/31/2011  . Confusion state 11/26/2010  . ABNORMAL ELECTROCARDIOGRAM  08/28/2010  . TINNITUS, CHRONIC 08/09/2010  . SINUSITIS, CHRONIC 08/09/2010  . Dizziness and giddiness 01/18/2010  . HYPERGLYCEMIA 08/23/2009  . LIVER FUNCTION TESTS, ABNORMAL, HX OF 08/23/2009  . FATIGUE 01/16/2009  . PERSONAL HX COLONIC POLYPS 09/21/2008  . ADENOMATOUS COLONIC POLYP 09/20/2008  . ALLERGIC RHINITIS 04/28/2008  . RESTRICTIVE LUNG DISEASE 04/28/2008  . CRAMPS,LEG 09/28/2007  . OBESITY 04/01/2007  . VOCAL CORD DISORDER 04/01/2007  . REDUCTION MAMMOPLASTY, HX OF 04/01/2007  . RESTLESS LEG SYNDROME 02/02/2007  . HYPERTENSION 02/02/2007  . MITRAL VALVE PROLAPSE 02/02/2007  . GERD 02/02/2007  . Irritable bowel syndrome 02/02/2007  . BREAST CYST 02/02/2007   Past Medical History:  Diagnosis Date  . Adenomatous colon polyp   . Allergic rhinitis   . Anxiety   . Asthma   . Cancer of kidney ([place])   . Colon polyps    adenomatous  . Diverticulosis   . Dysrhythmia 1999   Dr Johnsie Cancel 2003  . Elevated LFTs   . Fatty liver disease, nonalcoholic   . GERD (gastroesophageal reflux disease)   . Headache(784.0)    migraines with vision changes but no pain  . Heart murmur   . Hypertension   . IBS (irritable bowel syndrome)   . Obesity   . Pneumonia 2007  . Renal cell carcinoma ([place])   . Sleep apnea    moderate  . TIA (transient ischemic attack) 09/11/2014  . Ulcer ([place])     Family History  Problem Relation Age of Onset  . Heart disease Father 19  . Lung cancer Father   . Liver disease Neg Hx   . Heart disease Brother   . Asthma Neg Hx   . Hypertension Other   . Colon cancer Cousin   . Allergies Daughter   . Allergies Brother   . Melanoma Daughter     Past Surgical History:  Procedure Laterality Date  . ABDOMINAL HYSTERECTOMY  1979  . BREAST REDUCTION SURGERY  Jan 1991  . ELBOW SURGERY Left   . KIDNEY SURGERY Right October 2013    Tumor removed   . TONSILLECTOMY     Social History   Occupational History  . Retired Economist  .  Retired     Social History Main Topics  . Smoking status: Never Smoker  . Smokeless tobacco: Never Used  . Alcohol use 3.6 oz/week    6 Glasses of wine per week     Comment: daily>>>counselled to reduce ETOH use as of 06-23-13, has 1-2 glasses wine daily 4-5 days week  . Drug use: No  . Sexual activity: Yes    Birth control/ protection: Surgical

## 2016-09-18 ENCOUNTER — Encounter: Payer: Self-pay | Admitting: Nurse Practitioner

## 2016-09-18 ENCOUNTER — Ambulatory Visit (INDEPENDENT_AMBULATORY_CARE_PROVIDER_SITE_OTHER): Payer: Medicare Other | Admitting: Nurse Practitioner

## 2016-09-18 VITALS — BP 114/73 | HR 69 | Ht 64.0 in | Wt 229.2 lb

## 2016-09-18 DIAGNOSIS — G4733 Obstructive sleep apnea (adult) (pediatric): Secondary | ICD-10-CM | POA: Diagnosis not present

## 2016-09-18 DIAGNOSIS — H9319 Tinnitus, unspecified ear: Secondary | ICD-10-CM | POA: Diagnosis not present

## 2016-09-18 DIAGNOSIS — R42 Dizziness and giddiness: Secondary | ICD-10-CM

## 2016-09-18 DIAGNOSIS — G458 Other transient cerebral ischemic attacks and related syndromes: Secondary | ICD-10-CM | POA: Diagnosis not present

## 2016-09-18 NOTE — Progress Notes (Signed)
[facility]  PATIENT: Jamie [name] DOB: [birth year]   REASON FOR VISIT: follow up for TIAs migraine , OSA with CPAP  HISTORY FROM:patient and husband    HISTORY OF PRESENT ILLNESS:HISTORY (CD)Is and is here today to be evaluated for a headache, and she reports that she first had migrainous headaches when she resumed birth control pills over 40 years ago. The headaches were very debilitating at the time but there were infrequent through the 1970s. Once she discontinued hormonal contraception her headaches became as infrequent as once a year. In the early 1980s the couple lived in [place] and Jamie [name] recalls that the bright light was a trigger for headaches -there was a strong photophobic component, and she made many trips to the local hospital for Demerol shots. She underwent a self hypnosis treatment and this worked well, controlled the migraine for years. On 9-20 02-2013 the patient woke up with a severe explosive kind of pain that seemed to arise from the right eye. The pain was centered retro-orbital and radiated backwards into the center of the head. The patient tried aspirin and Motrin with no benefit at the time as my colleague Dr. Janann Colonel D.O. did document.  This was different from her typical migraines. She was extremely nauseated but she could not vomit and developed dry heaves. The headache broker out of sound sleep at about about 3 AM. She had no tearing of the eye ( that she could remember), the nose was not runny and her face was not flushe.She was asleep with her CPAP.  The patient had an eye exam in 2013 which was reportedly normal at that time and her headaches were evaluated with a brain MRI which was also reviewed and found unremarkable. Not had a number of the severe headache spell since. On 09-12-14 the patient again developed a headache this time of a new quality. She was watching TV at about 11 PM she noticed that the right vision was fluctuating she  states that her vision was disturbed by changing in light and dark sensation, that she describedas if it checker board was placed in front of her eyes. By 11.45 , she tried to answer an e mail, couldn't decipher the message, non-sensical to her. She switched and tried to read aloud, she couldn't , her speech output was not correlated to the text she read. Jibberish, while her normal speech was unaffected. On the left side this time by migraines have always attack the right side of her head. This was unusual as well. The entire episode lasted less than one hour. The emergency room obtained a CT head , non contrast, read as normal and reviewed here in imaging file. Her BP was very high.  She sleeps with a CPAP, and likes it very much, she had nocturia before being diagnosed with OSA.  Her pneumonia related hospitalization in 2007 was the cause for the test by [facility], as her RN had witnessed her sleep apnea. Dr .Elsworth Soho follows her, Dr Constance Holster is her ENT.  Interval history from 03/27/2017CD. Jamie [name] is doing very well she has had one of the severe headaches she used to describe in the past, -onset at about 5:30 after working on the computer. She developed a severe right-sided headache. Afraid this could be another TIA I  have recommended her to go to the emergency room, she was given morphine for the headaches which then caused another reaction and side effects. She did not have any speech arrest with  this one. The emergency room did not find anything abnormal, she underwent a CT scan of the brain, which was normal, MRI brain  was normal. I'm also able today to just review a download of her CPAP machine which has been followed by Dr. Shelia Media but not in the last 3 years. She has 100% compliance for days 100% compliance for over 4 hours of daily use, average user time is 9 hours 7 minutes set pressure is 12 cm water no EPR is used residual AHI is 0.8. Excellent result. Her obesity was the same. No weight loss.  She  was given a prescrition of Fioricet and ondansetron also known as Zofran. She's not supposed to take the medication before the headache component has started. UPDATE 03/14/2018CM Jamie [name], 71 year old female returns for follow-up.She has not had further stroke or TIA episodes. She denies any recent falls  She had a couple episodes of blurred vision without headache. She has not had any speech arrest, weakness on one side or the other etc. She continues to use her CPAP every night, however she took that she about 2 weeks ago and we are unable to get a download. She remains on enteric-coated aspirin. She continues to get little exercise due to her chronic vertigo. Her transcranial Dopplers showed minimal PFO not likely to be clinically significant. She has been evaluated at [facility] for her chronic vertigo. Her husband reports several instances since last seen when she fell asleep at the computer without her CPAP and stopped breathing. Once he got oxygen to her she woke up and was fine.  She returns for reevaluation.  REVIEW OF SYSTEMS: Full 14 system review of systems performed and notable only for those listed, all others are neg:  Constitutional: neg  Cardiovascular: neg Ear/Nose/Throat: Ringing in the ears Skin: neg Eyes: neg Respiratory: neg Gastroitestinal: neg  Hematology/Lymphatic: neg  Endocrine: neg Musculoskeletal:neg Allergy/Immunology: neg Neurological: Chronic vertigo Psychiatric: neg Sleep : Obstructive sleep apnea with CPAP ALLERGIES: Allergies  Allergen Reactions  . Buprenorphine Hcl Rash and Shortness Of Breath  . Morphine And Related Shortness Of Breath and Rash  . Morphine And Related Shortness Of Breath  . Aspirin Other (See Comments)    REACTION: ulcers  Can take 81 mg  . Avelox [Moxifloxacin Hcl In Nacl] Hives  . Diflucan [Fluconazole] Hives    Whelps and rash c skin peeling  . Diflucan [Fluconazole] Hives  . Latex Rash  . Moxifloxacin Palpitations     Tachycardia     HOME MEDICATIONS: Outpatient Medications Prior to Visit  Medication Sig Dispense Refill  . aspirin EC 81 MG tablet Take 1 tablet (81 mg total) by mouth daily. 90 tablet 3  . BIOTIN PO Take 1 capsule by mouth at bedtime.     . clidinium-chlordiazePOXIDE (LIBRAX) 5-2.5 MG capsule Take 1 capsule by mouth 2 (two) times daily. 60 capsule 11  . clonazePAM (KLONOPIN) 1 MG tablet Take 1 mg by mouth at bedtime.     . diphenhydrAMINE (BENADRYL) 25 MG tablet Take 25 mg by mouth every 8 (eight) hours as needed for allergies.     . fluticasone (FLONASE) 50 MCG/ACT nasal spray Place 2 sprays into the nose daily as needed for rhinitis or allergies.    . Linaclotide (LINZESS) 145 MCG CAPS capsule Take 1 capsule (145 mcg total) by mouth daily. 30 capsule 11  . metaxalone (SKELAXIN) 800 MG tablet Take 400 mg by mouth at bedtime.     . metoprolol succinate (TOPROL-XL)  100 MG 24 hr tablet Take 100 mg by mouth every evening. Take with or immediately following a meal.    . MILK THISTLE PO Take 1 capsule by mouth at bedtime.     . Multiple Vitamin (MULTIVITAMIN WITH MINERALS) TABS Take 1 tablet by mouth every evening.     . olmesartan (BENICAR) 40 MG tablet Take 40 mg by mouth every evening.    Marland Kitchen omeprazole (PRILOSEC) 40 MG capsule Take 1 capsule (40 mg total) by mouth daily. 90 capsule 3  . ondansetron (ZOFRAN) 4 MG tablet Take 1 tablet (4 mg total) by mouth every 6 (six) hours. 12 tablet 0  . spironolactone (ALDACTONE) 50 MG tablet Take 50 mg by mouth every morning.    Marland Kitchen UNABLE TO FIND Take 1 tablet by mouth daily. Med Name: probiotic once daily (10 billion units)    . esomeprazole (NEXIUM) 40 MG capsule Take 1 capsule (40 mg total) by mouth 2 (two) times daily before a meal. 60 capsule 11   Facility-Administered Medications Prior to Visit  Medication Dose Route Frequency Provider Last Rate Last Dose  . methylPREDNISolone acetate (DEPO-MEDROL) injection 80 mg  80 mg Intra-articular Once Cassandria Anger, MD        PAST MEDICAL HISTORY: Past Medical History:  Diagnosis Date  . Adenomatous colon polyp   . Allergic rhinitis   . Anxiety   . Asthma   . Cancer of kidney ([name])   . Colon polyps    adenomatous  . Diverticulosis   . Dysrhythmia 1999   Dr Johnsie Cancel 2003  . Elevated LFTs   . Fatty liver disease, nonalcoholic   . GERD (gastroesophageal reflux disease)   . Headache(784.0)    migraines with vision changes but no pain  . Heart murmur   . Hypertension   . IBS (irritable bowel syndrome)   . Obesity   . Pneumonia 2007  . Renal cell carcinoma ([place])   . Sleep apnea    moderate  . TIA (transient ischemic attack) 09/11/2014  . Ulcer ([place])     PAST SURGICAL HISTORY: Past Surgical History:  Procedure Laterality Date  . ABDOMINAL HYSTERECTOMY  1979  . BREAST REDUCTION SURGERY  Jan 1991  . ELBOW SURGERY Left   . KIDNEY SURGERY Right October 2013    Tumor removed   . TONSILLECTOMY      FAMILY HISTORY: Family History  Problem Relation Age of Onset  . Heart disease Father 31  . Lung cancer Father   . Liver disease Neg Hx   . Heart disease Brother   . Asthma Neg Hx   . Hypertension Other   . Colon cancer Cousin   . Allergies Daughter   . Allergies Brother   . Melanoma Daughter     SOCIAL HISTORY: Social History   Social History  . Marital status: Married    Spouse name: Sallyanne Kuster  . Number of children: 2  . Years of education: 15   Occupational History  . Retired Economist  .  Retired   Social History Main Topics  . Smoking status: Never Smoker  . Smokeless tobacco: Never Used  . Alcohol use 3.6 oz/week    6 Glasses of wine per week     Comment: daily>>>counselled to reduce ETOH use as of 06-23-13, has 1-2 glasses wine daily 4-5 days week  . Drug use: No  . Sexual activity: Yes    Birth control/ protection: Surgical  Other Topics Concern  . Not on file   Social History Narrative   ** Merged History Encounter **        Exercise 3-4 times a week. Married Sallyanne Kuster) and lives with her spouse. Retired Personal assistant. Education- college Patient is right handed. Patient drinks tea occasionally .     PHYSICAL EXAM  Vitals:   09/18/16 1358  BP: 114/73  Pulse: 69  Weight: 229 lb 3.2 oz (104 kg)  Height: 5' 4"  (1.626 m)   Body mass index is 39.34 kg/m.  Generalized: Well developed,Obese female in no acute distress  Head: normocephalic and atraumatic,. Oropharynx benign  Neck: Supple, no carotid bruits  Cardiac: Regular rate rhythm, no murmur  Musculoskeletal: No deformity   Neurological examination   Mentation: Alert oriented to time, place, history taking. Attention span and concentration appropriate. Recent and remote memory intact.  Follows all commands speech and language fluent. Ess 11 FSS 29.  Cranial nerve II-XII: .Pupils were equal round reactive to light extraocular movements were full, visual field were full on confrontational test. Facial sensation and strength were normal. hearing was intact to finger rubbing bilaterally. Uvula tongue midline. head turning and shoulder shrug were normal and symmetric.Tongue protrusion into cheek strength was normal. Motor: normal bulk and tone, full strength in the BUE, BLE, fine finger movements normal, no pronator drift.  Sensory: normal and symmetric to light touch, pinprick, and  Vibration, in the upper and lower extremities Coordination: finger-nose-finger, heel-to-shin bilaterally, no dysmetria Reflexes: Brachioradialis 2/2, biceps 2/2, triceps 2/2, patellar 2/2, Achilles 2/2, plantar responses were flexor bilaterally. Gait and Station: Rising up from seated position without assistance, normal stance,  moderate stride, good arm swing, smooth turning, able to perform tiptoe, and heel walking without difficulty. Tandem gait is steady.  DIAGNOSTIC DATA (LABS, IMAGING, TESTING) - I reviewed patient records, labs, notes, testing and imaging myself where  available.  Lab Results  Component Value Date   WBC 8.4 10/08/2015   HGB 13.1 10/08/2015   HCT 40.2 10/08/2015   MCV 94.6 10/08/2015   PLT 210 10/08/2015      Component Value Date/Time   NA 138 10/08/2015 0237   K 4.0 10/08/2015 0237   CL 105 10/08/2015 0237   CO2 18 (L) 10/08/2015 0237   GLUCOSE 114 (H) 10/08/2015 0237   BUN 14 10/08/2015 0237   CREATININE 0.85 10/08/2015 0237   CALCIUM 9.1 10/08/2015 0237   PROT 6.7 10/08/2015 0237   ALBUMIN 3.9 10/08/2015 0237   AST 69 (H) 10/08/2015 0237   ALT 117 (H) 10/08/2015 0237   ALKPHOS 56 10/08/2015 0237   BILITOT 0.5 10/08/2015 0237   GFRNONAA >60 10/08/2015 0237   GFRAA >60 10/08/2015 0237   Lab Results  Component Value Date   CHOL 168 08/24/2010   HDL 65.40 08/24/2010   LDLCALC 86 08/24/2010   TRIG 85.0 08/24/2010   CHOLHDL 3 08/24/2010   Lab Results  Component Value Date   HGBA1C 5.4 12/16/2011   Lab Results  Component Value Date   YWVPXTGG26 948 05/03/2011   Lab Results  Component Value Date   TSH 1.12 05/03/2011      ASSESSMENT AND PLAN  71 y.o. year old female  has a past medical history of Obstructive sleep apnea with CPAP, chronic vertigo she has participated in vestibular rehabilitation without success. She is seeing a specialist at [facility] but this was no help. Headaches are in good control.   PLAN: Continue ASA for secondary stroke  prevention B/P well controlled 114/73 Continue CPAP Low intensity exercise for overall health F/U in 3 months for compliance report I spent 48mn in total face to face time with the patient more than 50% of which was spent counseling and coordination of care, reviewing test results reviewing medications and discussing and reviewing the diagnosis of OSA and importance of  going to sleep with the appliance in place,  NDennie Bible G[facility] AC[facility] Mall[facility] [address] [place]ales CornersGCalipatria [place] [phone]

## 2016-09-18 NOTE — Patient Instructions (Signed)
Continue ASA for secondary stroke for secondary stroke prevention B/P well controlled 114/73 Continue CPAP F/U in 3 months for compliance report

## 2016-09-19 NOTE — Progress Notes (Signed)
I agree with the assessment and plan as directed by NP .The patient is known to me .   Ticia Virgo, MD  

## 2016-10-01 ENCOUNTER — Ambulatory Visit: Payer: Medicare Other | Admitting: Nurse Practitioner

## 2016-10-15 ENCOUNTER — Encounter: Payer: Self-pay | Admitting: Internal Medicine

## 2016-10-15 ENCOUNTER — Ambulatory Visit: Payer: Self-pay | Admitting: Internal Medicine

## 2016-10-15 ENCOUNTER — Ambulatory Visit (INDEPENDENT_AMBULATORY_CARE_PROVIDER_SITE_OTHER): Payer: Medicare Other | Admitting: Internal Medicine

## 2016-10-15 VITALS — BP 112/72 | Ht 63.0 in | Wt 228.2 lb

## 2016-10-15 DIAGNOSIS — K5900 Constipation, unspecified: Secondary | ICD-10-CM | POA: Diagnosis not present

## 2016-10-15 DIAGNOSIS — K589 Irritable bowel syndrome without diarrhea: Secondary | ICD-10-CM | POA: Diagnosis not present

## 2016-10-15 DIAGNOSIS — K219 Gastro-esophageal reflux disease without esophagitis: Secondary | ICD-10-CM

## 2016-10-15 MED ORDER — LINACLOTIDE 145 MCG PO CAPS: 145.0000 ug | ORAL_CAPSULE | Freq: Every day | ORAL | 11 refills | Status: DC

## 2016-10-15 MED ORDER — CILIDINIUM-CHLORDIAZEPOXIDE 2.5-5 MG PO CAPS: 1.0000 | ORAL_CAPSULE | Freq: Two times a day (BID) | ORAL | 11 refills | Status: DC

## 2016-10-15 MED ORDER — ESOMEPRAZOLE MAGNESIUM 40 MG PO CPDR: 40.0000 mg | DELAYED_RELEASE_CAPSULE | Freq: Every day | ORAL | 6 refills | Status: DC

## 2016-10-15 MED ORDER — OMEPRAZOLE 40 MG PO CPDR: 40.0000 mg | DELAYED_RELEASE_CAPSULE | Freq: Every day | ORAL | 3 refills | Status: DC

## 2016-10-15 NOTE — Patient Instructions (Signed)
We have sent the following medications to your pharmacy for you to pick up at your convenience:  Prilosec, Linzess, Librax  You will receive a letter reminding your to schedule your colonsocopy

## 2016-10-15 NOTE — Progress Notes (Signed)
HISTORY OF PRESENT ILLNESS:  Jamie [name] is a 71 y.o. female  with multiple medical problems as listed below. She is followed in this office for multiple GI issues including constipation, chronic GERD, IBS, fatty liver, and adenomatous colon polyps. Last evaluated March 2017. For her constipation she continues on linzess. This remains helpful. No obvious side effects. For her GERD she takes omeprazole 40 mg twice daily. No dysphagia. No breakthrough symptoms though she does describe intermittent coughing which she attributes to reflux. She feels that she did better on Nexium. Inquiring about medication change though has formulary issues. Terms of IBS she continues to take Librax twice daily which she has done for years. She requests a refill of all of her GI medications. Terms of her fatty liver and obesity, unfortunately no weight loss. Her last colonoscopy was June 2013. She is due this summer because of a history of adenomatous colon polyps.  REVIEW OF SYSTEMS:  All non-GI ROS negative except for anxiety, back pain, sinus and allergy, heart murmur, urinary leakage  Past Medical History:  Diagnosis Date  . Adenomatous colon polyp   . Allergic rhinitis   . Anxiety   . Asthma   . Cancer of kidney ([place])   . Colon polyps    adenomatous  . Diverticulosis   . Dysrhythmia 1999   Dr Johnsie Cancel 2003  . Elevated LFTs   . Fatty liver disease, nonalcoholic   . GERD (gastroesophageal reflux disease)   . Headache(784.0)    migraines with vision changes but no pain  . Heart murmur   . Hypertension   . IBS (irritable bowel syndrome)   . Obesity   . Pneumonia 2007  . Renal cell carcinoma ([place])   . Sleep apnea    moderate  . TIA (transient ischemic attack) 09/11/2014  . Ulcer [facility])     Past Surgical History:  Procedure Laterality Date  . ABDOMINAL HYSTERECTOMY  1979  . BREAST REDUCTION SURGERY  Jan 1991  . ELBOW SURGERY Left   . KIDNEY SURGERY Right October 2013    Tumor removed   .  TONSILLECTOMY      Social History Anyssa D Behlke  reports that she has never smoked. She has never used smokeless tobacco. She reports that she drinks about 3.6 oz of alcohol per week . She reports that she does not use drugs.  family history includes Allergies in her brother and daughter; Colon cancer in her cousin; Heart disease in her brother; Heart disease (age of onset: 32) in her father; Hypertension in her other; Lung cancer in her father; Melanoma in her daughter.  Allergies  Allergen Reactions  . Buprenorphine Hcl Rash and Shortness Of Breath  . Morphine And Related Shortness Of Breath and Rash  . Morphine And Related Shortness Of Breath  . Aspirin Other (See Comments)    REACTION: ulcers  Can take 81 mg  . Avelox [Moxifloxacin Hcl In Nacl] Hives  . Diflucan [Fluconazole] Hives    Whelps and rash c skin peeling  . Diflucan [Fluconazole] Hives  . Latex Rash  . Moxifloxacin Palpitations    Tachycardia        PHYSICAL EXAMINATION: Vital signs: BP 112/72   Ht 5' 3"  (1.6 m)   Wt 228 lb 4 oz (103.5 kg)   BMI 40.43 kg/m   Constitutional: Obese, generally well-appearing, no acute distress Psychiatric: alert and oriented x3, cooperative Eyes: extraocular movements intact, anicteric, conjunctiva pink Mouth: oral pharynx moist, no lesions Neck: supple  no lymphadenopathy Cardiovascular: heart regular rate and rhythm, systolic murmur Lungs: clear to auscultation bilaterally Abdomen: soft, obese, nontender, nondistended, no obvious ascites, no peritoneal signs, normal bowel sounds, no organomegaly Rectal: Deferred until colonoscopy Extremities: no clubbing cyanosis or lower extremity edema bilaterally Skin: no lesions on visible extremities Neuro: No focal deficits. Cranial nerves intact  ASSESSMENT:  #1. Chronic constipation. Responding to linzess #2. Chronic GERD. On twice a day omeprazole #3. IBS. She takes Librax twice daily which she states helps compared to not  using the medication #4. Fatty liver. #5. Obesity. No improvement  #6. History of adenomatous colon polyps spray last colonoscopy June 2013. Due for surveillance this year #. Multiple medical problems    PLAN:  #1. Reflux precautions. Reviewed #2. Weight loss. Importance discussed #3. Refill linzess #4. Refill PPI. Proper timing of drug reviewed #5. Refill Librax #6. Surveillance colonoscopy.The nature of the procedure, as well as the risks, benefits, and alternatives were carefully and thoroughly reviewed with the patient. Ample time for discussion and questions allowed. The patient understood, was satisfied, and agreed to proceed. #7. Routine office follow-up one year 25 minutes spent face-to-face with the patient. [place] 50% a time use for counseling and coordination of care regarding her multiple GI diagnoses as listed above

## 2016-10-17 ENCOUNTER — Ambulatory Visit (INDEPENDENT_AMBULATORY_CARE_PROVIDER_SITE_OTHER): Payer: Medicare Other | Admitting: Orthopaedic Surgery

## 2016-10-17 ENCOUNTER — Ambulatory Visit (INDEPENDENT_AMBULATORY_CARE_PROVIDER_SITE_OTHER): Payer: Medicare Other

## 2016-10-17 ENCOUNTER — Other Ambulatory Visit (INDEPENDENT_AMBULATORY_CARE_PROVIDER_SITE_OTHER): Payer: Self-pay

## 2016-10-17 ENCOUNTER — Encounter (INDEPENDENT_AMBULATORY_CARE_PROVIDER_SITE_OTHER): Payer: Self-pay | Admitting: Orthopaedic Surgery

## 2016-10-17 VITALS — BP 126/72 | HR 79 | Resp 16 | Ht 63.0 in | Wt 225.0 lb

## 2016-10-17 DIAGNOSIS — M79672 Pain in left foot: Secondary | ICD-10-CM

## 2016-10-17 DIAGNOSIS — M25572 Pain in left ankle and joints of left foot: Secondary | ICD-10-CM

## 2016-10-17 NOTE — Progress Notes (Signed)
Office Visit Note   Patient: Jamie [name]           Date of Birth: 02/05/1946           MRN: [id number] Visit Date: 10/17/2016              Requested by: Deland Pretty, MD [address] [place], [place] [address] PCP: Horatio Pel, MD   Assessment & Plan: Visit Diagnoses:  1. Pain in left ankle and joints of left foot   2. Left foot pain   Anterior ankle sprain with possible peroneal tendon tear  Plan: ASO ankle support, MRI scan left ankle  Follow-Up Instructions: No Follow-up on file.   Orders:  Orders Placed This Encounter  Procedures  . XR Ankle Complete Left  . XR Foot Complete Left   No orders of the defined types were placed in this encounter.     Procedures: No procedures performed   Clinical Data: No additional findings.   Subjective: Chief Complaint  Patient presents with  . Left Ankle - Pain  . Foot Pain    [facility] in February, 2018, Dr. Marlou Sa performed x-rays and placed patient in boot, ace bandage x 1 month, cannot climb up/down stairs, limited range of motion, difficulty flexing, bruising, toe pain w/walking, swelling at times, pain on top of foot and lateral ankle, limping, difficulty bearing weight, no weakness,   Jamie [name] is having pain predominantly along the anterior aspect of her left ankle. Seems to be related to a monitored time that she is on her feet. Some pain behind the lateral malleolus. No numbness or tingling.Recent swelling. Feels that her activities are limited as she oftentimes will limp  HPI  Review of Systems   Objective: Vital Signs: BP 126/72 (BP Location: Left Arm, Patient Position: Sitting, Cuff Size: Normal)   Pulse 79   Resp 16   Ht 5' 3"  (1.6 m)   Wt 225 lb (102.1 kg)   BMI 39.86 kg/m   Physical Exam  Ortho Exam emanation of the left ankle reveals mild tenderness behind the lateral malleolus and along the peroneal tendons. The tendon function appears to be intact. Some tenderness along  the very anterior aspect of her ankle near the ankle joint. No masses. No swelling, ecchymosis or erythema. No increased varus or valgus motion. Subtalar motion intact. Good pulses. Motor exam intact. No pain about the lesser toes or great toe. Functional arch.  Specialty Comments:  No specialty comments available.  Imaging: Xr Ankle Complete Left  Result Date: 10/17/2016 X-ray of the left ankle notes ectopic calcification just dorsal to the neck of the talus consistent with possible capsular injury. Ankle joint intact and symmetrical. No obvious fracture at medial or lateral malleolus. No soft tissue swelling  Xr Foot Complete Left  Result Date: 10/17/2016 X-rays of the left foot demonstrated a os peroneus. No evidence of fracture. Plantar heel spur. Mild degenerative changes at the great toe metatarsal phalangeal joint. No obvious degenerative changes. Normal alignment of foot    PMFS History: Patient Active Problem List   Diagnosis Date Noted  . Pain in left ankle and joints of left foot 08/13/2016  . Pain and swelling of left ankle 08/13/2016  . At risk for polypharmacy 10/02/2015  . Intractable migraine with aura with status migrainosus 10/02/2015  . Other specified transient cerebral ischemias 09/16/2014  . 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 Morbid obesity ([place]) 09/16/2014  . Headache 05/03/2013  . Preop exam for internal  medicine 03/10/2012  . Kidney mass 03/10/2012  . UTI (lower urinary tract infection) 02/13/2012  . Excessive tearing 12/16/2011  . Leg pain 09/20/2011  . Acute bronchitis 06/27/2011  . Wheezing 06/27/2011  . Rosacea 05/07/2011  . Shoulder pain, right 05/07/2011  . OSA (obstructive sleep apnea) 01/31/2011  . Confusion state 11/26/2010  . ABNORMAL ELECTROCARDIOGRAM 08/28/2010  . Tinnitus 08/09/2010  . SINUSITIS, CHRONIC 08/09/2010  . Dizziness and giddiness 01/18/2010  . HYPERGLYCEMIA 08/23/2009  . LIVER FUNCTION TESTS, ABNORMAL, HX OF 08/23/2009  . FATIGUE  01/16/2009  . PERSONAL HX COLONIC POLYPS 09/21/2008  . ADENOMATOUS COLONIC POLYP 09/20/2008  . ALLERGIC RHINITIS 04/28/2008  . RESTRICTIVE LUNG DISEASE 04/28/2008  . CRAMPS,LEG 09/28/2007  . OBESITY 04/01/2007  . VOCAL CORD DISORDER 04/01/2007  . REDUCTION MAMMOPLASTY, HX OF 04/01/2007  . RESTLESS LEG SYNDROME 02/02/2007  . HYPERTENSION 02/02/2007  . MITRAL VALVE PROLAPSE 02/02/2007  . GERD 02/02/2007  . Irritable bowel syndrome 02/02/2007  . BREAST CYST 02/02/2007   Past Medical History:  Diagnosis Date  . Adenomatous colon polyp   . Allergic rhinitis   . Anxiety   . Asthma   . Cancer of kidney ([place])   . Colon polyps    adenomatous  . Diverticulosis   . Dysrhythmia 1999   Dr Johnsie Cancel 2003  . Elevated LFTs   . Fatty liver disease, nonalcoholic   . GERD (gastroesophageal reflux disease)   . Headache(784.0)    migraines with vision changes but no pain  . Heart murmur   . Hypertension   . IBS (irritable bowel syndrome)   . Obesity   . Pneumonia 2007  . Renal cell carcinoma ([place])   . Sleep apnea    moderate  . TIA (transient ischemic attack) 09/11/2014  . Ulcer ([place])     Family History  Problem Relation Age of Onset  . Heart disease Father 12  . Lung cancer Father   . Heart disease Brother   . Hypertension Other   . Colon cancer Cousin   . Allergies Daughter   . Allergies Brother   . Melanoma Daughter   . Liver disease Neg Hx   . Asthma Neg Hx     Past Surgical History:  Procedure Laterality Date  . ABDOMINAL HYSTERECTOMY  1979  . BREAST REDUCTION SURGERY  Jan 1991  . ELBOW SURGERY Left   . KIDNEY SURGERY Right October 2013    Tumor removed   . TONSILLECTOMY     Social History   Occupational History  . Retired Economist  .  Retired   Social History Main Topics  . Smoking status: Never Smoker  . Smokeless tobacco: Never Used  . Alcohol use 3.6 oz/week    6 Glasses of wine per week     Comment: daily>>>counselled to reduce ETOH use as  of 06-23-13, has 1-2 glasses wine daily 4-5 days week  . Drug use: No  . Sexual activity: Yes    Birth control/ protection: Surgical

## 2016-10-18 ENCOUNTER — Telehealth: Payer: Self-pay

## 2016-10-18 MED ORDER — CILIDINIUM-CHLORDIAZEPOXIDE 2.5-5 MG PO CAPS: 1.0000 | ORAL_CAPSULE | Freq: Two times a day (BID) | ORAL | 3 refills | Status: DC

## 2016-10-18 NOTE — Telephone Encounter (Signed)
90 day supply of Librax sent to [facility]

## 2016-10-21 ENCOUNTER — Encounter: Payer: Self-pay | Admitting: Internal Medicine

## 2016-10-25 ENCOUNTER — Telehealth: Payer: Self-pay | Admitting: Internal Medicine

## 2016-10-28 MED ORDER — LINACLOTIDE 145 MCG PO CAPS: 145.0000 ug | ORAL_CAPSULE | Freq: Every day | ORAL | 3 refills | Status: DC

## 2016-10-28 NOTE — Telephone Encounter (Signed)
Patient husband calling back regarding this. Best # [phone]

## 2016-10-28 NOTE — Telephone Encounter (Signed)
Spoke with patient - told her I sent the Linzess to [facility] as a 90 day supply.  Faxed Nexium prior authorization and will call patient when I hear back

## 2016-10-30 ENCOUNTER — Telehealth: Payer: Self-pay

## 2016-10-30 MED ORDER — LINACLOTIDE 145 MCG PO CAPS: 145.0000 ug | ORAL_CAPSULE | Freq: Every day | ORAL | 3 refills | Status: DC

## 2016-10-30 NOTE — Telephone Encounter (Signed)
Left message that I resent Linzess 90 days to [facility].  Clarified that I had faxed prior auth to [facility] immediately after hanging up with patient.  In spite of this, faxed it again.

## 2016-10-30 NOTE — Telephone Encounter (Addendum)
Linzess Rx was sent to [facility] patient husband is requesting Linzess to be sent to [facility] 90 day supply. Patient husband also states that [facility] has not received info on prior auth. Best # [phone]. Patient husband is requesting callback regarding this anytime after 4pm

## 2016-11-01 ENCOUNTER — Encounter: Payer: Self-pay | Admitting: Internal Medicine

## 2016-11-04 ENCOUNTER — Telehealth: Payer: Self-pay | Admitting: Internal Medicine

## 2016-11-05 ENCOUNTER — Other Ambulatory Visit: Payer: Self-pay

## 2016-11-05 MED ORDER — ESOMEPRAZOLE MAGNESIUM 40 MG PO CPDR: 40.0000 mg | DELAYED_RELEASE_CAPSULE | Freq: Two times a day (BID) | ORAL | 3 refills | Status: DC

## 2016-11-05 NOTE — Telephone Encounter (Signed)
Nexium prior authorization and rx inadvertently sent for once a day instead of twice a day.  Called [facility] and verified that the prior authorization would cover twice a day and would be sent as a 90 day supply - #[id number] with 3 refills.  They assured me that it would.  Spoke with patient and shared this information.  Patient aware.

## 2016-11-07 ENCOUNTER — Emergency Department (HOSPITAL_COMMUNITY)
Admission: EM | Admit: 2016-11-07 | Discharge: 2016-11-08 | Disposition: A | Discharge status: Home / Self Care | Payer: Medicare Other | Attending: Emergency Medicine | Admitting: Emergency Medicine

## 2016-11-07 ENCOUNTER — Encounter (HOSPITAL_COMMUNITY): Payer: Self-pay | Admitting: *Deleted

## 2016-11-07 DIAGNOSIS — R51 Headache: Secondary | ICD-10-CM | POA: Diagnosis present

## 2016-11-07 DIAGNOSIS — Z7982 Long term (current) use of aspirin: Secondary | ICD-10-CM | POA: Insufficient documentation

## 2016-11-07 DIAGNOSIS — Z91040 Latex allergy status: Secondary | ICD-10-CM | POA: Diagnosis not present

## 2016-11-07 DIAGNOSIS — I10 Essential (primary) hypertension: Secondary | ICD-10-CM | POA: Diagnosis not present

## 2016-11-07 DIAGNOSIS — Z85528 Personal history of other malignant neoplasm of kidney: Secondary | ICD-10-CM | POA: Diagnosis not present

## 2016-11-07 DIAGNOSIS — Z79899 Other long term (current) drug therapy: Secondary | ICD-10-CM | POA: Insufficient documentation

## 2016-11-07 DIAGNOSIS — Z8673 Personal history of transient ischemic attack (TIA), and cerebral infarction without residual deficits: Secondary | ICD-10-CM | POA: Diagnosis not present

## 2016-11-07 DIAGNOSIS — J45909 Unspecified asthma, uncomplicated: Secondary | ICD-10-CM | POA: Insufficient documentation

## 2016-11-07 DIAGNOSIS — R52 Pain, unspecified: Secondary | ICD-10-CM | POA: Diagnosis not present

## 2016-11-07 DIAGNOSIS — G4489 Other headache syndrome: Secondary | ICD-10-CM | POA: Diagnosis not present

## 2016-11-07 DIAGNOSIS — G43009 Migraine without aura, not intractable, without status migrainosus: Secondary | ICD-10-CM | POA: Diagnosis not present

## 2016-11-07 NOTE — ED Triage Notes (Signed)
The pt arrived by gems from home  c/o a headache since 1700 today  She has chronic headaches.  She keeps her head covered up  While being triaged not answering pertinent questions asked  She just wantys to see a doctor.  She took tylenol with codeine x 2 20000.  When asked what she takes for her headaches usually she reported I dont know.  I need to see a doctor

## 2016-11-08 ENCOUNTER — Other Ambulatory Visit: Payer: Self-pay

## 2016-11-08 DIAGNOSIS — G43009 Migraine without aura, not intractable, without status migrainosus: Secondary | ICD-10-CM | POA: Diagnosis not present

## 2016-11-08 MED ORDER — ESOMEPRAZOLE MAGNESIUM 40 MG PO CPDR: 40.0000 mg | DELAYED_RELEASE_CAPSULE | Freq: Two times a day (BID) | ORAL | 3 refills | Status: DC

## 2016-11-08 MED ORDER — DIPHENHYDRAMINE HCL 50 MG/ML IJ SOLN
25.0000 mg | Freq: Once | INTRAMUSCULAR | Status: AC
  Administered 2016-11-08: 25 mg via INTRAVENOUS
  Filled 2016-11-08: qty 1

## 2016-11-08 MED ORDER — ESOMEPRAZOLE MAGNESIUM 40 MG PO PACK: 40.0000 mg | PACK | Freq: Two times a day (BID) | ORAL | 12 refills | Status: DC

## 2016-11-08 MED ORDER — METOCLOPRAMIDE HCL 5 MG/ML IJ SOLN
10.0000 mg | Freq: Once | INTRAMUSCULAR | Status: AC
Start: 2016-11-08 — End: 2016-11-08
  Administered 2016-11-08: 10 mg via INTRAVENOUS
  Filled 2016-11-08: qty 2

## 2016-11-08 MED ORDER — METOCLOPRAMIDE HCL 10 MG PO TABS: 10.0000 mg | ORAL_TABLET | Freq: Three times a day (TID) | ORAL | 0 refills | Status: DC | PRN

## 2016-11-08 NOTE — Discharge Instructions (Signed)

## 2016-11-08 NOTE — ED Provider Notes (Signed)
[facility] DEPT Provider Note   CSN: [id number] Arrival date & time: 11/07/16  2332   By signing my name below, I, Charolotte [name], attest that this documentation has been prepared under the direction and in the presence of Jamie Fraise, MD. Electronically Signed: Charolotte [name], Scribe. 11/08/16. 12:32 AM.    History   Chief Complaint Chief Complaint  Patient presents with  . Headache    HPI Jamie [name] is a 71 y.o. female.  Pt states she could not see out of right eye. Pt has h/o migraines, TIA, ulcers, HTN. Pt states that she usually loses vision with her migraines, but in both eyes. Pt denies fever, vomiting.   The history is provided by the patient. No language interpreter was used.  Headache   This is a recurrent problem. The current episode started 6 to 12 hours ago. The problem occurs constantly. The problem has not changed since onset.The headache is associated with nothing. The pain is located in the left unilateral region. The pain is at a severity of 10/10. The pain is severe. The pain does not radiate. Pertinent negatives include no fever, no nausea and no vomiting. She has tried NSAIDs and oral narcotic analgesics for the symptoms. The treatment provided no relief.    Past Medical History:  Diagnosis Date  . Adenomatous colon polyp   . Allergic rhinitis   . Anxiety   . Asthma   . Cancer of kidney ([place])   . Colon polyps    adenomatous  . Diverticulosis   . Dysrhythmia 1999   Dr Johnsie Cancel 2003  . Elevated LFTs   . Fatty liver disease, nonalcoholic   . GERD (gastroesophageal reflux disease)   . Headache(784.0)    migraines with vision changes but no pain  . Heart murmur   . Hypertension   . IBS (irritable bowel syndrome)   . Obesity   . Pneumonia 2007  . Renal cell carcinoma ([place])   . Sleep apnea    moderate  . TIA (transient ischemic attack) 09/11/2014  . Ulcer     Patient Active Problem List   Diagnosis Date Noted  . Pain in left ankle and joints  of left foot 08/13/2016  . Pain and swelling of left ankle 08/13/2016  . At risk for polypharmacy 10/02/2015  . Intractable migraine with aura with status migrainosus 10/02/2015  . Other specified transient cerebral ischemias 09/16/2014  . 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 Morbid obesity ([place]) 09/16/2014  . Headache 05/03/2013  . Preop exam for internal medicine 03/10/2012  . Kidney mass 03/10/2012  . UTI (lower urinary tract infection) 02/13/2012  . Excessive tearing 12/16/2011  . Leg pain 09/20/2011  . Acute bronchitis 06/27/2011  . Wheezing 06/27/2011  . Rosacea 05/07/2011  . Shoulder pain, right 05/07/2011  . OSA (obstructive sleep apnea) 01/31/2011  . Confusion state 11/26/2010  . ABNORMAL ELECTROCARDIOGRAM 08/28/2010  . Tinnitus 08/09/2010  . SINUSITIS, CHRONIC 08/09/2010  . Dizziness and giddiness 01/18/2010  . HYPERGLYCEMIA 08/23/2009  . LIVER FUNCTION TESTS, ABNORMAL, HX OF 08/23/2009  . FATIGUE 01/16/2009  . PERSONAL HX COLONIC POLYPS 09/21/2008  . ADENOMATOUS COLONIC POLYP 09/20/2008  . ALLERGIC RHINITIS 04/28/2008  . RESTRICTIVE LUNG DISEASE 04/28/2008  . CRAMPS,LEG 09/28/2007  . OBESITY 04/01/2007  . VOCAL CORD DISORDER 04/01/2007  . REDUCTION MAMMOPLASTY, HX OF 04/01/2007  . RESTLESS LEG SYNDROME 02/02/2007  . HYPERTENSION 02/02/2007  . MITRAL VALVE PROLAPSE 02/02/2007  . GERD 02/02/2007  . Irritable bowel syndrome 02/02/2007  .  BREAST CYST 02/02/2007    Past Surgical History:  Procedure Laterality Date  . ABDOMINAL HYSTERECTOMY  1979  . BREAST REDUCTION SURGERY  Jan 1991  . ELBOW SURGERY Left   . KIDNEY SURGERY Right October 2013    Tumor removed   . TONSILLECTOMY      OB History    Gravida Para Term Preterm AB Living   0 0 0 0 0     SAB TAB Ectopic Multiple Live Births   0 0 0           Home Medications    Prior to Admission medications   Medication Sig Start Date End Date Taking? Authorizing Provider  aspirin EC 81 MG tablet Take 1 tablet  (81 mg total) by mouth daily. 09/16/14   Asencion Partridge Dohmeier, MD  BIOTIN PO Take 1 capsule by mouth at bedtime.     Historical Provider, MD  clidinium-chlordiazePOXIDE (LIBRAX) 5-2.5 MG capsule Take 1 capsule by mouth 2 (two) times daily. 10/18/16   Irene Shipper, MD  clonazePAM (KLONOPIN) 1 MG tablet Take 1 mg by mouth at bedtime.     Historical Provider, MD  diphenhydrAMINE (BENADRYL) 25 MG tablet Take 25 mg by mouth every 8 (eight) hours as needed for allergies.     Historical Provider, MD  esomeprazole (NEXIUM) 40 MG capsule Take 1 capsule (40 mg total) by mouth 2 (two) times daily. 11/05/16   Irene Shipper, MD  fluticasone (FLONASE) 50 MCG/ACT nasal spray Place 2 sprays into the nose daily as needed for rhinitis or allergies.    Historical Provider, MD  linaclotide (LINZESS) 145 MCG CAPS capsule Take 1 capsule (145 mcg total) by mouth daily. 10/30/16   Irene Shipper, MD  metaxalone (SKELAXIN) 800 MG tablet Take 400 mg by mouth at bedtime.     Historical Provider, MD  metoprolol succinate (TOPROL-XL) 100 MG 24 hr tablet Take 100 mg by mouth every evening. Take with or immediately following a meal.    Historical Provider, MD  MILK THISTLE PO Take 1 capsule by mouth at bedtime.     Historical Provider, MD  Multiple Vitamin (MULTIVITAMIN WITH MINERALS) TABS Take 1 tablet by mouth every evening.     Historical Provider, MD  olmesartan (BENICAR) 40 MG tablet Take 40 mg by mouth every evening.    Historical Provider, MD  omeprazole (PRILOSEC) 40 MG capsule Take 1 capsule (40 mg total) by mouth daily. 10/15/16   Irene Shipper, MD  ondansetron (ZOFRAN) 4 MG tablet Take 1 tablet (4 mg total) by mouth every 6 (six) hours. 10/02/15   Asencion Partridge Dohmeier, MD  spironolactone (ALDACTONE) 50 MG tablet Take 50 mg by mouth every morning.    Historical Provider, MD  UNABLE TO FIND Take 1 tablet by mouth daily. Med Name: probiotic once daily (10 billion units)    Historical Provider, MD    Family History Family History  Problem  Relation Age of Onset  . Heart disease Father 63  . Lung cancer Father   . Heart disease Brother   . Hypertension Other   . Colon cancer Cousin   . Allergies Daughter   . Allergies Brother   . Melanoma Daughter   . Liver disease Neg Hx   . Asthma Neg Hx     Social History Social History  Substance Use Topics  . Smoking status: Never Smoker  . Smokeless tobacco: Never Used  . Alcohol use 3.6 oz/week    6 Glasses of  wine per week     Comment: daily>>>counselled to reduce ETOH use as of 06-23-13, has 1-2 glasses wine daily 4-5 days week     Allergies   Buprenorphine hcl; Morphine and related; Morphine and related; Aspirin; Avelox [moxifloxacin hcl in nacl]; Diflucan [fluconazole]; Diflucan [fluconazole]; Latex; and Moxifloxacin   Review of Systems Review of Systems  Constitutional: Negative for fever.  Gastrointestinal: Negative for nausea and vomiting.  Neurological: Positive for headaches.  All other systems reviewed and are negative.    Physical Exam Updated Vital Signs BP 119/61 (BP Location: Right Arm)   Pulse 73   Resp (!) 22   Ht 5' 4"  (1.626 m)   Wt 225 lb (102.1 kg)   SpO2 99%   BMI 38.62 kg/m   Physical Exam CONSTITUTIONAL: Well developed/well nourished HEAD: Normocephalic/atraumatic EYES: EOMI/PERRL, no nystagmus, no ptosis, wearing sunglasses ENMT: Mucous membranes moist NECK: supple no meningeal signs, no bruits SPINE/BACK:entire spine nontender CV: S1/S2 noted, no murmurs/rubs/gallops noted LUNGS: Lungs are clear to auscultation bilaterally, no apparent distress ABDOMEN: soft, nontender, no rebound or guarding NEURO:Awake/alert, face symmetric, no arm or leg drift is noted Equal 5/5 strength with shoulder abduction, elbow flex/extension, wrist flex/extension in upper extremities and equal hand grips bilaterally Equal 5/5 strength with hip flexion,knee flex/extension, foot dorsi/plantar flexion Cranial nerves 3/4/5/6/01/13/09/11/12 tested and  intact Gait normal without ataxia Sensation to light touch intact in all extremities EXTREMITIES: pulses normal, full ROM SKIN: warm, color normal PSYCH: no abnormalities of mood noted, alert and oriented to situation     ED Treatments / Results   DIAGNOSTIC STUDIES: Oxygen Saturation is 99% on room air, normal by my interpretation.    COORDINATION OF CARE: 12:32 AM Discussed treatment plan with pt at bedside and pt agreed to plan, which includes migraine cocktail and IV medications.    Labs (all labs ordered are listed, but only abnormal results are displayed) Labs Reviewed - No data to display  EKG  EKG Interpretation None       Radiology No results found.  Procedures Procedures (including critical care time)  Medications Ordered in ED Medications  metoCLOPramide (REGLAN) injection 10 mg (10 mg Intravenous Given 11/08/16 0122)  diphenhydrAMINE (BENADRYL) injection 25 mg (25 mg Intravenous Given 11/08/16 0120)     Initial Impression / Assessment and Plan / ED Course  I have reviewed the triage vital signs and the nursing notes.   Pt in the ED for HA and visual blurriness She denies focal weakness Initial exam limited due to her HA  Improved after meds, and no focal neuro deficits She is ambulatory She reports she has had symptoms of HA and blurred vision previously, but reports HA was left sided this time which was unusual No focal weakness or speech deficit noted She has multiple neuroimaging studies previously At this point, my suspicion for CVA/TIA or SAH or other acute neurologic emergency is low Will d/c home She requests reglan at discharge She is mildly hypotensive, but walks without difficulty,denies dizziness and requesting d/c home   Final Clinical Impressions(s) / ED Diagnoses   Final diagnoses:  Migraine without aura and without status migrainosus, not intractable    New Prescriptions New Prescriptions   METOCLOPRAMIDE (REGLAN) 10 MG  TABLET    Take 1 tablet (10 mg total) by mouth every 8 (eight) hours as needed for nausea (nausea/headache).   I personally performed the services described in this documentation, which was scribed in my presence. The recorded information has been reviewed  and is accurate.        Jamie Fraise, MD 11/08/16 0300

## 2016-11-08 NOTE — ED Notes (Signed)
Swallow screen passed

## 2016-11-08 NOTE — ED Notes (Signed)
Pt ambulated well no complaints noted at this time

## 2016-11-11 ENCOUNTER — Telehealth: Payer: Self-pay | Admitting: Internal Medicine

## 2016-11-11 NOTE — Telephone Encounter (Signed)
Medication finally correct

## 2016-11-12 ENCOUNTER — Ambulatory Visit
Admission: RE | Admit: 2016-11-12 | Discharge: 2016-11-12 | Disposition: A | Discharge status: Home / Self Care | Payer: Medicare Other | Source: Ambulatory Visit | Attending: Orthopaedic Surgery | Admitting: Orthopaedic Surgery

## 2016-11-12 DIAGNOSIS — M19072 Primary osteoarthritis, left ankle and foot: Secondary | ICD-10-CM | POA: Diagnosis not present

## 2016-11-12 DIAGNOSIS — M25572 Pain in left ankle and joints of left foot: Secondary | ICD-10-CM

## 2016-11-20 ENCOUNTER — Inpatient Hospital Stay (HOSPITAL_COMMUNITY)
Admission: EM | Admit: 2016-11-20 | Discharge: 2016-11-23 | DRG: 392 | Disposition: A | Discharge status: Home / Self Care | Payer: Medicare Other | Attending: Internal Medicine | Admitting: Internal Medicine

## 2016-11-20 ENCOUNTER — Telehealth: Payer: Self-pay | Admitting: Internal Medicine

## 2016-11-20 ENCOUNTER — Emergency Department (HOSPITAL_COMMUNITY): Payer: Medicare Other

## 2016-11-20 ENCOUNTER — Encounter (HOSPITAL_COMMUNITY): Payer: Self-pay

## 2016-11-20 DIAGNOSIS — F419 Anxiety disorder, unspecified: Secondary | ICD-10-CM | POA: Diagnosis present

## 2016-11-20 DIAGNOSIS — G4733 Obstructive sleep apnea (adult) (pediatric): Secondary | ICD-10-CM | POA: Diagnosis not present

## 2016-11-20 DIAGNOSIS — Z85528 Personal history of other malignant neoplasm of kidney: Secondary | ICD-10-CM

## 2016-11-20 DIAGNOSIS — Z91040 Latex allergy status: Secondary | ICD-10-CM

## 2016-11-20 DIAGNOSIS — Z808 Family history of malignant neoplasm of other organs or systems: Secondary | ICD-10-CM

## 2016-11-20 DIAGNOSIS — I10 Essential (primary) hypertension: Secondary | ICD-10-CM | POA: Diagnosis present

## 2016-11-20 DIAGNOSIS — E669 Obesity, unspecified: Secondary | ICD-10-CM | POA: Diagnosis present

## 2016-11-20 DIAGNOSIS — R339 Retention of urine, unspecified: Secondary | ICD-10-CM | POA: Diagnosis not present

## 2016-11-20 DIAGNOSIS — R32 Unspecified urinary incontinence: Secondary | ICD-10-CM | POA: Diagnosis present

## 2016-11-20 DIAGNOSIS — Z8249 Family history of ischemic heart disease and other diseases of the circulatory system: Secondary | ICD-10-CM

## 2016-11-20 DIAGNOSIS — N390 Urinary tract infection, site not specified: Secondary | ICD-10-CM | POA: Diagnosis present

## 2016-11-20 DIAGNOSIS — Z888 Allergy status to other drugs, medicaments and biological substances status: Secondary | ICD-10-CM

## 2016-11-20 DIAGNOSIS — K7581 Nonalcoholic steatohepatitis (NASH): Secondary | ICD-10-CM | POA: Diagnosis not present

## 2016-11-20 DIAGNOSIS — Z7982 Long term (current) use of aspirin: Secondary | ICD-10-CM

## 2016-11-20 DIAGNOSIS — Z881 Allergy status to other antibiotic agents status: Secondary | ICD-10-CM

## 2016-11-20 DIAGNOSIS — K219 Gastro-esophageal reflux disease without esophagitis: Secondary | ICD-10-CM | POA: Diagnosis present

## 2016-11-20 DIAGNOSIS — Z87440 Personal history of urinary (tract) infections: Secondary | ICD-10-CM

## 2016-11-20 DIAGNOSIS — Z800 Family history of malignant neoplasm of digestive organs: Secondary | ICD-10-CM

## 2016-11-20 DIAGNOSIS — Z885 Allergy status to narcotic agent status: Secondary | ICD-10-CM

## 2016-11-20 DIAGNOSIS — Z90710 Acquired absence of both cervix and uterus: Secondary | ICD-10-CM

## 2016-11-20 DIAGNOSIS — R1084 Generalized abdominal pain: Secondary | ICD-10-CM | POA: Diagnosis not present

## 2016-11-20 DIAGNOSIS — Z6839 Body mass index (BMI) 39.0-39.9, adult: Secondary | ICD-10-CM

## 2016-11-20 DIAGNOSIS — K760 Fatty (change of) liver, not elsewhere classified: Secondary | ICD-10-CM | POA: Diagnosis present

## 2016-11-20 DIAGNOSIS — Z905 Acquired absence of kidney: Secondary | ICD-10-CM

## 2016-11-20 DIAGNOSIS — Z801 Family history of malignant neoplasm of trachea, bronchus and lung: Secondary | ICD-10-CM

## 2016-11-20 DIAGNOSIS — Z86010 Personal history of colonic polyps: Secondary | ICD-10-CM

## 2016-11-20 DIAGNOSIS — Z886 Allergy status to analgesic agent status: Secondary | ICD-10-CM

## 2016-11-20 DIAGNOSIS — R10829 Rebound abdominal tenderness, unspecified site: Secondary | ICD-10-CM | POA: Diagnosis not present

## 2016-11-20 DIAGNOSIS — Z8673 Personal history of transient ischemic attack (TIA), and cerebral infarction without residual deficits: Secondary | ICD-10-CM

## 2016-11-20 DIAGNOSIS — J45909 Unspecified asthma, uncomplicated: Secondary | ICD-10-CM | POA: Diagnosis present

## 2016-11-20 DIAGNOSIS — K589 Irritable bowel syndrome without diarrhea: Secondary | ICD-10-CM | POA: Diagnosis present

## 2016-11-20 DIAGNOSIS — G2581 Restless legs syndrome: Secondary | ICD-10-CM | POA: Diagnosis present

## 2016-11-20 DIAGNOSIS — K5732 Diverticulitis of large intestine without perforation or abscess without bleeding: Principal | ICD-10-CM | POA: Diagnosis present

## 2016-11-20 DIAGNOSIS — R109 Unspecified abdominal pain: Secondary | ICD-10-CM | POA: Diagnosis not present

## 2016-11-20 DIAGNOSIS — G43909 Migraine, unspecified, not intractable, without status migrainosus: Secondary | ICD-10-CM | POA: Diagnosis present

## 2016-11-20 DIAGNOSIS — K5792 Diverticulitis of intestine, part unspecified, without perforation or abscess without bleeding: Secondary | ICD-10-CM | POA: Diagnosis not present

## 2016-11-20 DIAGNOSIS — Z8701 Personal history of pneumonia (recurrent): Secondary | ICD-10-CM

## 2016-11-20 DIAGNOSIS — R338 Other retention of urine: Secondary | ICD-10-CM | POA: Diagnosis present

## 2016-11-20 DIAGNOSIS — Z79899 Other long term (current) drug therapy: Secondary | ICD-10-CM

## 2016-11-20 HISTORY — DX: Restless legs syndrome: G25.81

## 2016-11-20 LAB — URINALYSIS, ROUTINE W REFLEX MICROSCOPIC
BILIRUBIN URINE: NEGATIVE
Bacteria, UA: NONE SEEN
Glucose, UA: NEGATIVE mg/dL
HGB URINE DIPSTICK: NEGATIVE
Ketones, ur: NEGATIVE mg/dL
LEUKOCYTES UA: NEGATIVE
NITRITE: NEGATIVE
PROTEIN: NEGATIVE mg/dL
SPECIFIC GRAVITY, URINE: 1.006 (ref 1.005–1.030)
Squamous Epithelial / LPF: NONE SEEN
pH: 5 (ref 5.0–8.0)

## 2016-11-20 LAB — COMPREHENSIVE METABOLIC PANEL
ALT: 69 U/L — ABNORMAL HIGH (ref 14–54)
ANION GAP: 9 (ref 5–15)
AST: 40 U/L (ref 15–41)
Albumin: 3.9 g/dL (ref 3.5–5.0)
Alkaline Phosphatase: 67 U/L (ref 38–126)
BILIRUBIN TOTAL: 0.7 mg/dL (ref 0.3–1.2)
BUN: 20 mg/dL (ref 6–20)
CHLORIDE: 106 mmol/L (ref 101–111)
CO2: 21 mmol/L — ABNORMAL LOW (ref 22–32)
Calcium: 9.2 mg/dL (ref 8.9–10.3)
Creatinine, Ser: 1.02 mg/dL — ABNORMAL HIGH (ref 0.44–1.00)
GFR calc Af Amer: >60 mL/min (ref >60)
GFR, EST NON AFRICAN AMERICAN: 55 mL/min — AB (ref >60)
Glucose, Bld: 91 mg/dL (ref 65–99)
POTASSIUM: 4.4 mmol/L (ref 3.5–5.1)
Sodium: 136 mmol/L (ref 135–145)
TOTAL PROTEIN: 7.2 g/dL (ref 6.5–8.1)

## 2016-11-20 LAB — CBC WITH DIFFERENTIAL/PLATELET
BASOS ABS: 0 10*3/uL (ref 0.0–0.1)
BASOS PCT: 0 %
Eosinophils Absolute: 0.1 10*3/uL (ref 0.0–0.7)
Eosinophils Relative: 1 %
HEMATOCRIT: 39.8 % (ref 36.0–46.0)
HEMOGLOBIN: 13.2 g/dL (ref 12.0–15.0)
Lymphocytes Relative: 13 %
Lymphs Abs: 1.5 10*3/uL (ref 0.7–4.0)
MCH: 31.4 pg (ref 26.0–34.0)
MCHC: 33.2 g/dL (ref 30.0–36.0)
MCV: 94.8 fL (ref 78.0–100.0)
Monocytes Absolute: 0.9 10*3/uL (ref 0.1–1.0)
Monocytes Relative: 8 %
NEUTROS ABS: 9.2 10*3/uL — AB (ref 1.7–7.7)
NEUTROS PCT: 78 %
Platelets: 186 10*3/uL (ref 150–400)
RBC: 4.2 MIL/uL (ref 3.87–5.11)
RDW: 12.6 % (ref 11.5–15.5)
WBC: 11.8 10*3/uL — ABNORMAL HIGH (ref 4.0–10.5)

## 2016-11-20 LAB — I-STAT CG4 LACTIC ACID, ED: Lactic Acid, Venous: 0.83 mmol/L (ref 0.5–1.9)

## 2016-11-20 LAB — LIPASE, BLOOD: LIPASE: 35 U/L (ref 11–51)

## 2016-11-20 MED ORDER — SODIUM CHLORIDE 0.9 % IV BOLUS (SEPSIS)
1000.0000 mL | Freq: Once | INTRAVENOUS | Status: AC
  Administered 2016-11-20: 1000 mL via INTRAVENOUS

## 2016-11-20 MED ORDER — ONDANSETRON HCL 4 MG/2ML IJ SOLN
4.0000 mg | Freq: Once | INTRAMUSCULAR | Status: AC
  Administered 2016-11-20: 4 mg via INTRAVENOUS
  Filled 2016-11-20: qty 2

## 2016-11-20 MED ORDER — IOPAMIDOL (ISOVUE-300) INJECTION 61%
75.0000 mL | Freq: Once | INTRAVENOUS | Status: AC | PRN
  Administered 2016-11-20: 75 mL via INTRAVENOUS

## 2016-11-20 MED ORDER — AMOXICILLIN-POT CLAVULANATE 875-125 MG PO TABS
1.0000 | ORAL_TABLET | Freq: Once | ORAL | Status: AC
  Administered 2016-11-20: 1 via ORAL
  Filled 2016-11-20: qty 1

## 2016-11-20 MED ORDER — FENTANYL CITRATE (PF) 100 MCG/2ML IJ SOLN
25.0000 ug | Freq: Once | INTRAMUSCULAR | Status: AC
  Administered 2016-11-20: 25 ug via INTRAVENOUS
  Filled 2016-11-20: qty 2

## 2016-11-20 MED ORDER — IOPAMIDOL (ISOVUE-300) INJECTION 61%
INTRAVENOUS | Status: AC
  Filled 2016-11-20: qty 75

## 2016-11-20 MED ORDER — HYDROMORPHONE HCL 1 MG/ML IJ SOLN
0.5000 mg | Freq: Once | INTRAMUSCULAR | Status: AC
  Administered 2016-11-20: 0.5 mg via INTRAVENOUS
  Filled 2016-11-20: qty 0.5

## 2016-11-20 NOTE — Telephone Encounter (Signed)
Spoke with pt and she states she is at her PCP office seeing a PA now. States she will call back and let us know if she will keep her appt with our office for tomorrow.

## 2016-11-20 NOTE — ED Notes (Signed)
Lab called by spencer, RN to obtain lab due to multiple attempts.

## 2016-11-20 NOTE — ED Notes (Signed)
Patient transported to CT 

## 2016-11-20 NOTE — ED Notes (Signed)
Phlebotomy called for lab draw 

## 2016-11-20 NOTE — Telephone Encounter (Signed)
Pt states she is being treated for a UTI and is on Cephalexin that she started taking Saturday. Pt reports that on Monday she started having pain below her belly button that starts in center of her abdomen and moves to the left. Reports the area is tender to touch. States last night she had a temp of 99.9. She feels like she needs to have a BM but reports that she can't. Also reports when she was trying to sleep she can't lie on her side due to the pain. Pts last colon was done in 2013 and report mentioned mild diverticulitis. Please advise.

## 2016-11-20 NOTE — Telephone Encounter (Signed)
Left message for pt to call back  °

## 2016-11-20 NOTE — H&P (Signed)
History and Physical    Jamie [name]:[id number] DOB: Mar 14, 1946 DOA: 11/20/2016  PCP: Deland Pretty, MD Consultants:  Henrene Pastor - GI; [facility] - urology; [place] - orthopedics; Stann Mainland - urogynecology Patient coming from: home - lives with husband; NOK: husband, [phone]  Chief Complaint: abdominal pain  HPI: Jamie [name] is a 71 y.o. female with medical history significant of TIA; OSA on CPAP; renal cell CA s/p partial nephrectomy; HTN; and diverticulosis presenting with abdominal pain.  Monday night, she started pooping several times.  "Sort of mixed up", kind of loose.  Pain that night, difficulty sleeping.  Slept most of the day Tuesday.  This AM about 1000 the symptoms worsened and she felt terrible ever since.  "Everything was so bloated, so much gas", and yet unable to pee.  Bladder pain. Already on antibiotics for UTI - 3rd UTI since the end of March.  Started Saturday with Keflex x 7 days.  +fever, subjective.  101 at the doctor's office today.  Pain is across the lower abdomen with radiation into the lower back.  Also with excruciating headache; she does have migraines and this feels similar to usual although the onset was somewhat different.  No n/v but minimal PO intake.   ED Course: 1L bolus, Zofran, Dilaudid 0.5 x 2, PO Augmentin; patient with urinary retention and 661m in bladder so foley was placed  Review of Systems: As per HPI; otherwise review of systems reviewed and negative.   Ambulatory Status:  Ambulates without assistance  Past Medical History:  Diagnosis Date  . Allergic rhinitis   . Anxiety   . Asthma   . Colon polyps    adenomatous  . Diverticulosis   . Dysrhythmia 1999   Dr NJohnsie Cancel2003  . Elevated LFTs   . Fatty liver disease, nonalcoholic   . GERD (gastroesophageal reflux disease)   . Headache(784.0)    migraines with vision changes but no pain  . Heart murmur   . Hypertension   . IBS (irritable bowel syndrome)   . Obesity   . Pneumonia  2007  . Renal cell carcinoma ([facility] 2013  . Restless leg syndrome   . Sleep apnea    moderate; wears CPAP, uncertain about setting  . TIA (transient ischemic attack) 09/11/2014  . Ulcer     Past Surgical History:  Procedure Laterality Date  . ABDOMINAL HYSTERECTOMY  1979  . BREAST REDUCTION SURGERY  Jan 1991  . ELBOW SURGERY Left   . KIDNEY SURGERY Right October 2013    Tumor removed   . TONSILLECTOMY      Social History   Social History  . Marital status: Married    Spouse name: [name] . Number of children: 2  . Years of education: 15   Occupational History  . Retired OEconomist .  Retired   Social History Main Topics  . Smoking status: Never Smoker  . Smokeless tobacco: Never Used  . Alcohol use 3.6 oz/week    6 Glasses of wine per week     Comment: 3-4 times a week  . Drug use: No  . Sexual activity: Yes    Birth control/ protection: Surgical   Other Topics Concern  . Not on file   Social History Narrative   ** Merged History Encounter **       Exercise 3-4 times a week. Married (Sallyanne Kuster and lives with her spouse. Retired RPersonal assistant Education- college Patient is right  handed. Patient drinks tea occasionally .    Allergies  Allergen Reactions  . Buprenorphine Hcl Rash and Shortness Of Breath  . Morphine And Related Shortness Of Breath and Rash  . Morphine And Related Shortness Of Breath  . Aspirin Other (See Comments)    REACTION: ulcers  Can take 81 mg  . Avelox [Moxifloxacin Hcl In Nacl] Hives  . Diflucan [Fluconazole] Hives    Whelps and rash c skin peeling  . Diflucan [Fluconazole] Hives  . Latex Rash  . Moxifloxacin Palpitations    Tachycardia     Family History  Problem Relation Age of Onset  . Heart disease Father 94  . Lung cancer Father   . Heart disease Brother   . Hypertension Other   . Colon cancer Cousin   . Allergies Daughter   . Allergies Brother   . Melanoma Daughter   . Liver disease Neg  Hx   . Asthma Neg Hx     Prior to Admission medications   Medication Sig Start Date End Date Taking? Authorizing Provider  aspirin EC 81 MG tablet Take 1 tablet (81 mg total) by mouth daily. 09/16/14  Yes Dohmeier, Asencion Partridge, MD  BIOTIN PO Take 1 capsule by mouth at bedtime.    Yes [provider]  clidinium-chlordiazePOXIDE (LIBRAX) 5-2.5 MG capsule Take 1 capsule by mouth 2 (two) times daily. 10/18/16  Yes Irene Shipper, MD  clonazePAM (KLONOPIN) 1 MG tablet Take 1 mg by mouth at bedtime.    Yes [provider]  diphenhydrAMINE (BENADRYL) 25 MG tablet Take 25 mg by mouth every 8 (eight) hours as needed for allergies.    Yes [provider]  esomeprazole (NEXIUM) 40 MG capsule Take 1 capsule (40 mg total) by mouth 2 (two) times daily. 11/08/16  Yes Irene Shipper, MD  fluticasone [facility]) 50 MCG/ACT nasal spray Place 2 sprays into the nose daily as needed for rhinitis or allergies.   Yes [provider]  linaclotide (LINZESS) 145 MCG CAPS capsule Take 1 capsule (145 mcg total) by mouth daily. 10/30/16  Yes Irene Shipper, MD  metaxalone (SKELAXIN) 800 MG tablet Take 400 mg by mouth at bedtime.    Yes [provider]  metoCLOPramide (REGLAN) 10 MG tablet Take 1 tablet (10 mg total) by mouth every 8 (eight) hours as needed for nausea (nausea/headache). 11/08/16  Yes [name], MD  metoprolol succinate (TOPROL-XL) 100 MG 24 hr tablet Take 100 mg by mouth every evening. Take with or immediately following a meal.   Yes [provider]  MILK THISTLE PO Take 1 capsule by mouth at bedtime.    Yes [provider]  Multiple Vitamin (MULTIVITAMIN WITH MINERALS) TABS Take 1 tablet by mouth every evening.    Yes [provider]  olmesartan (BENICAR) 40 MG tablet Take 40 mg by mouth every evening.   Yes [provider]  spironolactone (ALDACTONE) 50 MG tablet Take 50 mg by mouth every morning.   Yes [provider]    esomeprazole (NEXIUM) 40 MG packet Take 40 mg by mouth 2 (two) times daily. 11/08/16   Irene Shipper, MD    Physical Exam: Vitals:   11/20/16 1653 11/20/16 1920 11/20/16 2126 11/20/16 2159  BP:  (!) 106/54 99/61 128/67  Pulse:  81 86 92  Resp:  18 18 20   Temp:    99.9 F (37.7 C)  TempSrc:    Oral  SpO2:  100% 94% 96%  Weight:  Height: 5' 3"  (1.6 m)        General:  Appears calm and comfortable and is NAD.  She had a washcloth over her eyes the entire visit and never removed it. Eyes: Covered throughout evaluation ENT:  grossly normal hearing, lips & tongue, mmm Neck:  no LAD, masses or thyromegaly Cardiovascular:  RRR, no m/r/g. No LE edema.  Respiratory:  CTA bilaterally, no w/r/r. Normal respiratory effort. Abdomen:  soft, ntnd, NABS Skin:  no rash or induration seen on limited exam Musculoskeletal:  grossly normal tone BUE/BLE, good ROM, no bony abnormality Psychiatric:  grossly normal mood and affect, speech fluent and appropriate, AOx3 Neurologic:  CN 2-12 grossly intact, moves all extremities in coordinated fashion, sensation intact  Labs on Admission: I have personally reviewed following labs and imaging studies  CBC:  Recent Labs Lab 11/20/16 2004  WBC 11.8*  NEUTROABS 9.2*  HGB 13.2  HCT 39.8  MCV 94.8  PLT 914   Basic Metabolic Panel:  Recent Labs Lab 11/20/16 2004  NA 136  K 4.4  CL 106  CO2 21*  GLUCOSE 91  BUN 20  CREATININE 1.02*  CALCIUM 9.2   GFR: Estimated Creatinine Clearance: 59.5 mL/min (A) (by C-G formula based on SCr of 1.02 mg/dL (H)). Liver Function Tests:  Recent Labs Lab 11/20/16 2004  AST 40  ALT 69*  ALKPHOS 67  BILITOT 0.7  PROT 7.2  ALBUMIN 3.9    Recent Labs Lab 11/20/16 2004  LIPASE 35   No results for input(s): AMMONIA in the last 168 hours. Coagulation Profile: No results for input(s): INR, PROTIME in the last 168 hours. Cardiac Enzymes: No results for input(s): CKTOTAL, CKMB, CKMBINDEX, TROPONINI  in the last 168 hours. BNP (last 3 results) No results for input(s): PROBNP in the last 8760 hours. HbA1C: No results for input(s): HGBA1C in the last 72 hours. CBG: No results for input(s): GLUCAP in the last 168 hours. Lipid Profile: No results for input(s): CHOL, HDL, LDLCALC, TRIG, CHOLHDL, LDLDIRECT in the last 72 hours. Thyroid Function Tests: No results for input(s): TSH, T4TOTAL, FREET4, T3FREE, THYROIDAB in the last 72 hours. Anemia Panel: No results for input(s): VITAMINB12, FOLATE, FERRITIN, TIBC, IRON, RETICCTPCT in the last 72 hours. Urine analysis:    Component Value Date/Time   COLORURINE STRAW (A) 11/20/2016 1738   APPEARANCEUR CLEAR 11/20/2016 1738   LABSPEC 1.006 11/20/2016 1738   PHURINE 5.0 11/20/2016 1738   GLUCOSEU NEGATIVE 11/20/2016 1738   GLUCOSEU NEGATIVE 02/14/2012 1524   HGBUR NEGATIVE 11/20/2016 1738   BILIRUBINUR NEGATIVE 11/20/2016 1738   KETONESUR NEGATIVE 11/20/2016 1738   PROTEINUR NEGATIVE 11/20/2016 1738   UROBILINOGEN 0.2 02/23/2012 1623   NITRITE NEGATIVE 11/20/2016 1738   LEUKOCYTESUR NEGATIVE 11/20/2016 1738    Creatinine Clearance: Estimated Creatinine Clearance: 59.5 mL/min (A) (by C-G formula based on SCr of 1.02 mg/dL (H)).  Sepsis Labs: @LABRCNTIP (procalcitonin:4,lacticidven:4) )No results found for this or any previous visit (from the past 240 hour(s)).   Radiological Exams on Admission: Ct Abdomen Pelvis W Contrast  Result Date: 11/20/2016 CLINICAL DATA:  Abdominal pain and distention x2 days with rebound tenderness in the right lower quadrant. EXAM: CT ABDOMEN AND PELVIS WITH CONTRAST TECHNIQUE: Multidetector CT imaging of the abdomen and pelvis was performed using the standard protocol following bolus administration of intravenous contrast. CONTRAST:  34m ISOVUE-300 IOPAMIDOL (ISOVUE-300) INJECTION 61% COMPARISON:  11/20/2015 and 02/06/2012 CT FINDINGS: Lower chest: Normal size cardiac chambers. There is atelectasis and/or  scarring at the  lung bases. No pneumonic consolidation or effusion. Calcified right hilar lymph nodes are present possibly from old granulomatous disease. Hepatobiliary: Hepatic steatosis. Unremarkable gallbladder. No biliary dilatation or hepatic mass. Pancreas: Normal Spleen: Normal Adrenals/Urinary Tract: Normal bilateral adrenal glands. Partial nephrectomy of the right kidney. Tiny too small to further characterize hypodensities are seen within both kidneys statistically consistent with cysts. No nephrolithiasis. No hydroureteronephrosis. The urinary bladder is contracted and decompressed by Foley catheter. Stomach/Bowel: Pericolonic inflammation along the distal sigmoid colon with mild mural thickening consistent with acute uncomplicated diverticulitis Vascular/Lymphatic: No significant vascular findings are present. Mild atherosclerosis of the common iliac arteries bilaterally. No aneurysm of the aorta. No enlarged abdominal or pelvic lymph nodes. Reproductive: Hysterectomy. Tiny 9 mm cyst in the left adnexa without worrisome features. Other: No abdominal wall hernia or abnormality. No abdominopelvic ascites. Musculoskeletal: No acute or significant osseous findings. IMPRESSION: 1. Acute uncomplicated sigmoid diverticulitis. No bowel obstruction or abscess. 2. Status post right partial nephrectomy without evidence local recurrence or metastatic disease. Tiny too small to further characterize hypodensities appear stable within both kidneys. 3. Benign-appearing 9 mm cyst in the left adnexum. Status post hysterectomy. Electronically Signed   By: Ashley Royalty M.D.   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)   Diverticulitis -Patient's symptoms are c/w diverticulitis and her CT  supports this as a diagnosis -Her only SIRS criteria is leukocytosis (11.8) -For now, will give bowel rest, IVF, pain medication with Tylenol, nausea medication with Zofran, and treat with Zosyn for intraabdominal infection (allergy to Avelox so no Cipro) -With mild flare, anticipate that patient will improve and be stable for d/c in the next 1-2 days  Acute urinary retention -Patient with h/o partial nephrectomy and c/o multiple recent UTIs, currently on her 3rd set of antibiotics (reports taking Keflex, day 3/7) -UA negative -Will check urine culture -She had 600 cc of urine when foley was placed -Will maintain foley overnight -Trial of removal in AM -If unsuccessful, likely ok for d/c to home with indwelling foley and outpatient urology f/u -The other alternative would be to request that urology see the patient prior to d/c  HTN -Continue Toprol and Benicar -Hold Aldactone  OSA -Continue CPAP -Does not know how setting so will use Autopap  NASH -AST 40/ALT 69, improved from 4/17   DVT prophylaxis:  Lovenox Code Status:  Full - confirmed with patient/family Family Communication: Husband present throughout evaluation Disposition Plan:  Home once clinically improved Consults called: None  Admission status: It is my clinical opinion that referral for OBSERVATION is reasonable and necessary in this patient based on the above information provided. The aforementioned taken together are felt to place the patient at high risk for further clinical deterioration. However it is anticipated that the patient may be medically stable for discharge from the hospital within 24 to 48 hours.    Karmen Bongo MD [facility]  If 7PM-7AM, please contact night-coverage [email or web address] Password [email or web address]  11/21/2016, 12:07 AM

## 2016-11-20 NOTE — ED Notes (Signed)
Attempted to draw blood, was unsuccessful.

## 2016-11-20 NOTE — ED Triage Notes (Addendum)
Per EMS- patient was seen at [facility] today for c/o abdominal pain and distention x 2 days. Patient had rebound tenderness RLQ. Patient denies N/V/D

## 2016-11-20 NOTE — Telephone Encounter (Signed)
It may be the UTI, but can't be certain. She is welcome to see an extender for evaluation this week if we have an opening

## 2016-11-20 NOTE — ED Notes (Signed)
Bed: QQ59 Expected date:  Expected time:  Means of arrival:  Comments: EMS abd. pain

## 2016-11-21 ENCOUNTER — Ambulatory Visit: Payer: Medicare Other | Admitting: Physician Assistant

## 2016-11-21 DIAGNOSIS — N390 Urinary tract infection, site not specified: Secondary | ICD-10-CM | POA: Diagnosis present

## 2016-11-21 DIAGNOSIS — Z85528 Personal history of other malignant neoplasm of kidney: Secondary | ICD-10-CM | POA: Diagnosis not present

## 2016-11-21 DIAGNOSIS — G2581 Restless legs syndrome: Secondary | ICD-10-CM | POA: Diagnosis present

## 2016-11-21 DIAGNOSIS — Z8701 Personal history of pneumonia (recurrent): Secondary | ICD-10-CM | POA: Diagnosis not present

## 2016-11-21 DIAGNOSIS — R338 Other retention of urine: Secondary | ICD-10-CM | POA: Diagnosis present

## 2016-11-21 DIAGNOSIS — Z86010 Personal history of colonic polyps: Secondary | ICD-10-CM | POA: Diagnosis not present

## 2016-11-21 DIAGNOSIS — Z90710 Acquired absence of both cervix and uterus: Secondary | ICD-10-CM | POA: Diagnosis not present

## 2016-11-21 DIAGNOSIS — Z87440 Personal history of urinary (tract) infections: Secondary | ICD-10-CM | POA: Diagnosis not present

## 2016-11-21 DIAGNOSIS — K760 Fatty (change of) liver, not elsewhere classified: Secondary | ICD-10-CM | POA: Diagnosis not present

## 2016-11-21 DIAGNOSIS — K7581 Nonalcoholic steatohepatitis (NASH): Secondary | ICD-10-CM | POA: Diagnosis present

## 2016-11-21 DIAGNOSIS — F419 Anxiety disorder, unspecified: Secondary | ICD-10-CM | POA: Diagnosis present

## 2016-11-21 DIAGNOSIS — I10 Essential (primary) hypertension: Secondary | ICD-10-CM

## 2016-11-21 DIAGNOSIS — K5732 Diverticulitis of large intestine without perforation or abscess without bleeding: Secondary | ICD-10-CM | POA: Diagnosis present

## 2016-11-21 DIAGNOSIS — R1084 Generalized abdominal pain: Secondary | ICD-10-CM | POA: Diagnosis not present

## 2016-11-21 DIAGNOSIS — K5792 Diverticulitis of intestine, part unspecified, without perforation or abscess without bleeding: Secondary | ICD-10-CM | POA: Diagnosis not present

## 2016-11-21 DIAGNOSIS — E669 Obesity, unspecified: Secondary | ICD-10-CM | POA: Diagnosis present

## 2016-11-21 DIAGNOSIS — G4733 Obstructive sleep apnea (adult) (pediatric): Secondary | ICD-10-CM

## 2016-11-21 DIAGNOSIS — Z8673 Personal history of transient ischemic attack (TIA), and cerebral infarction without residual deficits: Secondary | ICD-10-CM | POA: Diagnosis not present

## 2016-11-21 DIAGNOSIS — R32 Unspecified urinary incontinence: Secondary | ICD-10-CM | POA: Diagnosis present

## 2016-11-21 DIAGNOSIS — Z800 Family history of malignant neoplasm of digestive organs: Secondary | ICD-10-CM | POA: Diagnosis not present

## 2016-11-21 DIAGNOSIS — R339 Retention of urine, unspecified: Secondary | ICD-10-CM | POA: Diagnosis present

## 2016-11-21 DIAGNOSIS — Z6839 Body mass index (BMI) 39.0-39.9, adult: Secondary | ICD-10-CM | POA: Diagnosis not present

## 2016-11-21 DIAGNOSIS — G43909 Migraine, unspecified, not intractable, without status migrainosus: Secondary | ICD-10-CM | POA: Diagnosis present

## 2016-11-21 DIAGNOSIS — K219 Gastro-esophageal reflux disease without esophagitis: Secondary | ICD-10-CM | POA: Diagnosis present

## 2016-11-21 DIAGNOSIS — J45909 Unspecified asthma, uncomplicated: Secondary | ICD-10-CM | POA: Diagnosis present

## 2016-11-21 DIAGNOSIS — Z905 Acquired absence of kidney: Secondary | ICD-10-CM | POA: Diagnosis not present

## 2016-11-21 DIAGNOSIS — K589 Irritable bowel syndrome without diarrhea: Secondary | ICD-10-CM | POA: Diagnosis present

## 2016-11-21 DIAGNOSIS — Z801 Family history of malignant neoplasm of trachea, bronchus and lung: Secondary | ICD-10-CM | POA: Diagnosis not present

## 2016-11-21 LAB — CBC
HEMATOCRIT: 36.8 % (ref 36.0–46.0)
Hemoglobin: 12 g/dL (ref 12.0–15.0)
MCH: 31.1 pg (ref 26.0–34.0)
MCHC: 32.6 g/dL (ref 30.0–36.0)
MCV: 95.3 fL (ref 78.0–100.0)
Platelets: 205 10*3/uL (ref 150–400)
RBC: 3.86 MIL/uL — ABNORMAL LOW (ref 3.87–5.11)
RDW: 12.8 % (ref 11.5–15.5)
WBC: 9.1 10*3/uL (ref 4.0–10.5)

## 2016-11-21 LAB — BASIC METABOLIC PANEL
Anion gap: 9 (ref 5–15)
BUN: 19 mg/dL (ref 6–20)
CO2: 22 mmol/L (ref 22–32)
Calcium: 9.1 mg/dL (ref 8.9–10.3)
Chloride: 104 mmol/L (ref 101–111)
Creatinine, Ser: 1.02 mg/dL — ABNORMAL HIGH (ref 0.44–1.00)
GFR calc Af Amer: >60 mL/min (ref >60)
GFR, EST NON AFRICAN AMERICAN: 55 mL/min — AB (ref >60)
GLUCOSE: 104 mg/dL — AB (ref 65–99)
POTASSIUM: 4 mmol/L (ref 3.5–5.1)
Sodium: 135 mmol/L (ref 135–145)

## 2016-11-21 MED ORDER — ENOXAPARIN SODIUM 40 MG/0.4ML ~~LOC~~ SOLN
40.0000 mg | SUBCUTANEOUS | Status: DC
  Administered 2016-11-21 – 2016-11-23 (×3): 40 mg via SUBCUTANEOUS
  Filled 2016-11-21 (×3): qty 0.4

## 2016-11-21 MED ORDER — CHLORHEXIDINE GLUCONATE 0.12 % MT SOLN
15.0000 mL | Freq: Two times a day (BID) | OROMUCOSAL | Status: DC
  Administered 2016-11-21 – 2016-11-22 (×2): 15 mL via OROMUCOSAL
  Filled 2016-11-21 (×4): qty 15

## 2016-11-21 MED ORDER — CLONAZEPAM 1 MG PO TABS
1.0000 mg | ORAL_TABLET | Freq: Every day | ORAL | Status: DC
  Administered 2016-11-21 – 2016-11-22 (×3): 1 mg via ORAL
  Filled 2016-11-21 (×3): qty 1

## 2016-11-21 MED ORDER — PANTOPRAZOLE SODIUM 40 MG PO TBEC
40.0000 mg | DELAYED_RELEASE_TABLET | Freq: Every day | ORAL | Status: DC
  Administered 2016-11-21 – 2016-11-23 (×3): 40 mg via ORAL
  Filled 2016-11-21 (×3): qty 1

## 2016-11-21 MED ORDER — FENTANYL CITRATE (PF) 100 MCG/2ML IJ SOLN
25.0000 ug | INTRAMUSCULAR | Status: DC | PRN
  Administered 2016-11-21: 25 ug via INTRAVENOUS
  Filled 2016-11-21: qty 2

## 2016-11-21 MED ORDER — PIPERACILLIN-TAZOBACTAM 3.375 G IVPB 30 MIN
3.3750 g | Freq: Once | INTRAVENOUS | Status: AC
  Administered 2016-11-21: 3.375 g via INTRAVENOUS
  Filled 2016-11-21 (×2): qty 50

## 2016-11-21 MED ORDER — IRBESARTAN 300 MG PO TABS
300.0000 mg | ORAL_TABLET | Freq: Every day | ORAL | Status: DC
  Administered 2016-11-22 – 2016-11-23 (×2): 300 mg via ORAL
  Filled 2016-11-21 (×3): qty 1

## 2016-11-21 MED ORDER — ONDANSETRON HCL 4 MG PO TABS
4.0000 mg | ORAL_TABLET | Freq: Four times a day (QID) | ORAL | Status: DC | PRN
  Administered 2016-11-23: 4 mg via ORAL
  Filled 2016-11-21: qty 1

## 2016-11-21 MED ORDER — ORAL CARE MOUTH RINSE
15.0000 mL | Freq: Two times a day (BID) | OROMUCOSAL | Status: DC
  Administered 2016-11-21: 15 mL via OROMUCOSAL

## 2016-11-21 MED ORDER — PIPERACILLIN-TAZOBACTAM 3.375 G IVPB
3.3750 g | Freq: Three times a day (TID) | INTRAVENOUS | Status: DC
  Administered 2016-11-21 – 2016-11-22 (×4): 3.375 g via INTRAVENOUS
  Filled 2016-11-21 (×3): qty 50

## 2016-11-21 MED ORDER — METOCLOPRAMIDE HCL 10 MG PO TABS: 10.0000 mg | ORAL_TABLET | Freq: Three times a day (TID) | ORAL | Status: DC | PRN

## 2016-11-21 MED ORDER — ASPIRIN EC 81 MG PO TBEC
81.0000 mg | DELAYED_RELEASE_TABLET | Freq: Every day | ORAL | Status: DC
  Administered 2016-11-21 – 2016-11-23 (×3): 81 mg via ORAL
  Filled 2016-11-21 (×3): qty 1

## 2016-11-21 MED ORDER — CILIDINIUM-CHLORDIAZEPOXIDE 2.5-5 MG PO CAPS
1.0000 | ORAL_CAPSULE | Freq: Two times a day (BID) | ORAL | Status: DC
  Administered 2016-11-21 – 2016-11-23 (×6): 1 via ORAL
  Filled 2016-11-21 (×6): qty 1

## 2016-11-21 MED ORDER — LACTATED RINGERS IV SOLN
INTRAVENOUS | Status: DC
  Administered 2016-11-21: 01:00:00 via INTRAVENOUS
  Administered 2016-11-21: 1000 mL via INTRAVENOUS
  Administered 2016-11-22: 06:00:00 via INTRAVENOUS

## 2016-11-21 MED ORDER — ONDANSETRON HCL 4 MG/2ML IJ SOLN: 4.0000 mg | Freq: Four times a day (QID) | INTRAMUSCULAR | Status: DC | PRN

## 2016-11-21 MED ORDER — METAXALONE 400 MG HALF TABLET
400.0000 mg | ORAL_TABLET | Freq: Every day | ORAL | Status: DC
  Administered 2016-11-21 – 2016-11-22 (×3): 400 mg via ORAL
  Filled 2016-11-21 (×3): qty 1

## 2016-11-21 MED ORDER — KETOROLAC TROMETHAMINE 30 MG/ML IJ SOLN
15.0000 mg | Freq: Four times a day (QID) | INTRAMUSCULAR | Status: DC | PRN
  Administered 2016-11-21 – 2016-11-23 (×2): 15 mg via INTRAVENOUS
  Filled 2016-11-21 (×2): qty 1

## 2016-11-21 MED ORDER — LINACLOTIDE 145 MCG PO CAPS
145.0000 ug | ORAL_CAPSULE | Freq: Every day | ORAL | Status: DC
  Filled 2016-11-21 (×3): qty 1

## 2016-11-21 MED ORDER — ACETAMINOPHEN 650 MG RE SUPP: 650.0000 mg | Freq: Four times a day (QID) | RECTAL | Status: DC | PRN

## 2016-11-21 MED ORDER — HYDROMORPHONE HCL 1 MG/ML IJ SOLN
0.5000 mg | INTRAMUSCULAR | Status: DC | PRN
  Administered 2016-11-21: 0.5 mg via INTRAVENOUS
  Filled 2016-11-21: qty 1

## 2016-11-21 MED ORDER — ACETAMINOPHEN 325 MG PO TABS
650.0000 mg | ORAL_TABLET | Freq: Four times a day (QID) | ORAL | Status: DC | PRN
  Administered 2016-11-21: 650 mg via ORAL
  Filled 2016-11-21: qty 2

## 2016-11-21 MED ORDER — METOPROLOL SUCCINATE ER 50 MG PO TB24
100.0000 mg | ORAL_TABLET | Freq: Every evening | ORAL | Status: DC
  Administered 2016-11-22: 100 mg via ORAL
  Filled 2016-11-21 (×2): qty 2

## 2016-11-21 NOTE — Progress Notes (Signed)
Pharmacy Antibiotic Note  Jamie [name] is a 71 y.o. female c/o abdominal pain admitted on 11/20/2016 with intra-abdominal infection.  Pharmacy has been consulted for zosyn dosing.  Plan: Zosyn 3.375g IV q8h (4 hour infusion).  Height: 5' 3"  (160 cm) Weight: 224 lb 6.9 oz (101.8 kg) IBW/kg (Calculated) : 52.4  Temp (24hrs), Avg:99.7 F (37.6 C), Min:99.2 F (37.3 C), Max:100.1 F (37.8 C)   Recent Labs Lab 11/20/16 1802 11/20/16 2004  WBC  --  11.8*  CREATININE  --  1.02*  LATICACIDVEN 0.83  --     Estimated Creatinine Clearance: 60.2 mL/min (A) (by C-G formula based on SCr of 1.02 mg/dL (H)).    Allergies  Allergen Reactions  . Buprenorphine Hcl Rash and Shortness Of Breath  . Morphine And Related Shortness Of Breath and Rash  . Morphine And Related Shortness Of Breath  . Aspirin Other (See Comments)    REACTION: ulcers  Can take 81 mg  . Avelox [Moxifloxacin Hcl In Nacl] Hives  . Diflucan [Fluconazole] Hives    Whelps and rash c skin peeling  . Diflucan [Fluconazole] Hives  . Latex Rash  . Moxifloxacin Palpitations    Tachycardia     Antimicrobials this admission: 5/16 augmentin >> x1 ED 5/17 zosyn >>   Dose adjustments this admission:   Microbiology results:  BCx:   UCx:    Sputum:    MRSA PCR:   Thank you for allowing pharmacy to be a part of this patient's care.  Dorrene German 11/21/2016 12:24 AM

## 2016-11-21 NOTE — Progress Notes (Signed)
PROGRESS NOTE  Jamie [name] DGL:[id number] DOB: 08-08-1945 DOA: 11/20/2016 PCP: Deland Pretty, MD   LOS: 0 days   Brief Narrative: 71 year old female with history of TIA, obstructive sleep apnea on CPAP, renal cell carcinoma status post partial nephrectomy, hypertension, urinary incontinence, was admitted on 5/16 with abdominal pain, bloating and urinary retention.  She was diagnosed recently with a urinary tract infection and has been on Keflex for a few days prior to coming to the hospital.  She also complains of poor p.o. intake, and inability to have a bowel movement or pass gas.  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 -Patient's physical exam seems to be a little bit out of proportion with the CT scan findings, as patient is quite tender. -Continue IV antibiotics, continue fluids, remain n.p.o.  Encouraged ambulation.  Urinary retention -Patient with urinary incontinence foliaceous followed at [facility].  She does not recall having retention issues in the past.  I wonder whether this is related to #1 local irritation versus UTI.  She has been on antibiotics already, urinalysis does not show any evidence of UTI.  Urine cultures are pending. -Once #1 is improving, will probably do a voiding trial.  Not today.  Hypertension -Continue home medications  Obstructive sleep apnea -Continue CPAP  GERD -On Protonix   DVT prophylaxis: Lovenox Code Status: Full code Family Communication: no family at bedside Disposition Plan: Home when ready  Consultants:   None  Procedures:   None    Antimicrobials:  Zosyn 5/16 >>   Subjective: -Continues to complain of being very uncomfortable, with abdominal bloating, abdominal pain, denies any vomiting.  Did not pass gas or had a BM  Objective: Vitals:   11/21/16 0020 11/21/16 0305 11/21/16 0639 11/21/16 0942  BP: 96/62  115/61 (!) 98/56  Pulse: 85 76 74 70  Resp: 20 18 18    Temp: 99.2 F (37.3 C)  98.1 F (36.7 C)   TempSrc: Oral  Axillary   SpO2: 97% 94% 96%   Weight: 101.8 kg (224 lb 6.9 oz)     Height: 5' 3"  (1.6 m)       Intake/Output Summary (Last 24 hours) at 11/21/16 1124 Last data filed at 11/21/16 1020  Gross per 24 hour  Intake           656.25 ml  Output             1700 ml  Net         -1043.75 ml   Filed Weights   11/20/16 1648 11/21/16 0020  Weight: 99.8 kg (220 lb) 101.8 kg (224 lb 6.9 oz)    Examination:  Vitals:   11/21/16 0020 11/21/16 0305 11/21/16 0639 11/21/16 0942  BP: 96/62  115/61 (!) 98/56  Pulse: 85 76 74 70  Resp: 20 18 18    Temp: 99.2 F (37.3 C)  98.1 F (36.7 C)   TempSrc: Oral  Axillary   SpO2: 97% 94% 96%   Weight: 101.8 kg (224 lb 6.9 oz)     Height: 5' 3"  (1.6 m)       Constitutional: NAD Eyes: lids and conjunctivae normal ENMT: Mucous membranes are moist.  Respiratory:  clear to auscultation bilaterally, no wheezing, no crackles. Normal respiratory effort.  Cardiovascular: Regular rate and rhythm, no murmurs / rubs / gallops. No LE edema. 2+ pedal pulses.  Abdomen: Tender to palpation throughout, voluntary guarding, bowel sounds present Musculoskeletal: no clubbing / cyanosis. Neurologic: CN 2-12 grossly intact. Strength 5/5 in all 4.  Psychiatric: Normal judgment and insight. Alert and oriented x 3. Normal mood.    Data Reviewed: I have personally reviewed following labs and imaging studies  CBC:  Recent Labs Lab 11/20/16 2004 11/21/16 0444  WBC 11.8* 9.1  NEUTROABS 9.2*  --   HGB 13.2 12.0  HCT 39.8 36.8  MCV 94.8 95.3  PLT 186 242   Basic  Metabolic Panel:  Recent Labs Lab 11/20/16 2004 11/21/16 0444  NA 136 135  K 4.4 4.0  CL 106 104  CO2 21* 22  GLUCOSE 91 104*  BUN 20 19  CREATININE 1.02* 1.02*  CALCIUM 9.2 9.1   GFR: Estimated Creatinine Clearance: 60.2 mL/min (A) (by C-G formula based on SCr of 1.02 mg/dL (H)). Liver Function Tests:  Recent Labs Lab 11/20/16 2004  AST 40  ALT 69*  ALKPHOS 67  BILITOT 0.7  PROT 7.2  ALBUMIN 3.9    Recent Labs Lab 11/20/16 2004  LIPASE 35   No results for input(s): AMMONIA in the last 168 hours. Coagulation Profile: No results for input(s): INR, PROTIME in the last 168 hours. Cardiac Enzymes: No results for input(s): CKTOTAL, CKMB, CKMBINDEX, TROPONINI in the last 168 hours. BNP (last 3 results) No results for input(s): PROBNP in the last 8760 hours. HbA1C: No results for input(s): HGBA1C in the last 72 hours. CBG: No results for input(s): GLUCAP in the last 168 hours. Lipid Profile: No results for input(s): CHOL, HDL, LDLCALC, TRIG, CHOLHDL, LDLDIRECT in the last 72 hours. Thyroid Function Tests: No results for input(s): TSH, T4TOTAL, FREET4, T3FREE, THYROIDAB in the last 72 hours. Anemia Panel: No results for input(s): VITAMINB12, FOLATE, FERRITIN, TIBC, IRON, RETICCTPCT in the last 72 hours. Urine analysis:    Component Value Date/Time   COLORURINE STRAW (A) 11/20/2016 1738   APPEARANCEUR CLEAR 11/20/2016 1738   LABSPEC 1.006 11/20/2016 1738   PHURINE 5.0 11/20/2016 1738   GLUCOSEU NEGATIVE 11/20/2016 1738   GLUCOSEU NEGATIVE 02/14/2012 1524   HGBUR NEGATIVE 11/20/2016 [place] 11/20/2016 [place] 11/20/2016 1738   PROTEINUR NEGATIVE 11/20/2016 1738   UROBILINOGEN 0.2 02/23/2012 1623   NITRITE NEGATIVE 11/20/2016 1738   LEUKOCYTESUR NEGATIVE 11/20/2016 1738   Sepsis Labs: Invalid input(s): PROCALCITONIN, LACTICIDVEN  No results found for this or any previous visit (from the past 240 hour(s)).     Radiology Studies: Ct Abdomen Pelvis W Contrast  Result Date: 11/20/2016 CLINICAL DATA:  Abdominal pain and distention x2 days with rebound tenderness in the right lower quadrant. EXAM: CT ABDOMEN AND PELVIS WITH CONTRAST TECHNIQUE: Multidetector CT imaging of the abdomen and pelvis was performed using the standard protocol following bolus administration of intravenous contrast. CONTRAST:  24m ISOVUE-300 IOPAMIDOL (ISOVUE-300) INJECTION 61% COMPARISON:  11/20/2015 and 02/06/2012 CT FINDINGS: Lower chest: Normal size cardiac chambers. There is atelectasis and/or scarring at the lung bases. No pneumonic consolidation or effusion. Calcified right hilar lymph nodes are present possibly from old granulomatous disease. Hepatobiliary: Hepatic steatosis. Unremarkable gallbladder. No biliary dilatation or hepatic mass. Pancreas: Normal Spleen: Normal Adrenals/Urinary Tract: Normal bilateral adrenal glands. Partial nephrectomy of the right kidney. Tiny too small to further characterize hypodensities are  seen within both kidneys statistically consistent with cysts. No nephrolithiasis. No hydroureteronephrosis. The urinary bladder is contracted and decompressed by Foley catheter. Stomach/Bowel: Pericolonic inflammation along the distal sigmoid colon with mild mural thickening consistent with acute uncomplicated diverticulitis Vascular/Lymphatic: No significant vascular findings are present. Mild atherosclerosis of the common iliac arteries bilaterally. No aneurysm of the aorta. No enlarged abdominal or pelvic lymph nodes. Reproductive: Hysterectomy. Tiny 9 mm cyst in the left adnexa without worrisome features. Other: No abdominal wall hernia or abnormality. No abdominopelvic ascites. Musculoskeletal: No acute or significant osseous findings. IMPRESSION: 1. Acute uncomplicated sigmoid diverticulitis. No bowel obstruction or abscess. 2. Status post right partial nephrectomy without evidence local recurrence or  metastatic disease. Tiny too small to further characterize hypodensities appear stable within both kidneys. 3. Benign-appearing 9 mm cyst in the left adnexum. Status post hysterectomy. Electronically Signed   By: Ashley Royalty M.D.   On: 11/20/2016 20:52     Scheduled Meds: . aspirin EC  81 mg Oral Daily  . clidinium-chlordiazePOXIDE  1 capsule Oral BID  . clonazePAM  1 mg Oral QHS  . enoxaparin (LOVENOX) injection  40 mg Subcutaneous Q24H  . irbesartan  300 mg Oral Daily  . linaclotide  145 mcg Oral Daily  . metaxalone  400 mg Oral QHS  . metoprolol succinate  100 mg Oral QPM  . pantoprazole  40 mg Oral Daily   Continuous Infusions: . lactated ringers 125 mL/hr at 11/21/16 0600  . piperacillin-tazobactam (ZOSYN)  IV 3.375 g (11/21/16 0759)      Marzetta Board, MD, PhD [facility] Pager [phone]  If 7PM-7AM, please contact night-coverage [email or web address] Password [facility] 11/21/2016, 11:24 AM

## 2016-11-21 NOTE — ED Provider Notes (Signed)
[facility] DEPT Provider Note   CSN: [id number] Arrival date & time: 11/20/16  1641     History   Chief Complaint Chief Complaint  Patient presents with  . Abdominal Pain    HPI Jamie [name] is a 71 y.o. female.  HPI 71 year old Caucasian female past medical history significant for obesity, hypertension, TIA, renal cell carcinoma status post partial nephrectomy, diverticulosis, migraines presents to the emergency department today by EMS coming from primary care doctor with complaints of lower abdominal pain and distention. Patient states that over the past 2 days she has gradually worsening generalized abdominal pain that is more specifically located suprapubic and the right lower quadrant. States that she feels that she has a lot of gas. Patient was recently started on antibiotics Keflex for a UTI but she's been taking for approximately 3 days. States this is her third UTI in the past 3 months. At the doctor's office today patient was noted to have rebound in the right lower quadrant and sent to the ED for evaluation. The patient complains of nausea but denies any emesis. She denies any diarrhea and states that she stays constipated.patient reports subjective fevers noted to have a fever at the doctor's office of 100.1.patient states the pain is located in her lower abdomen describes it as cramping and sharp in nature. Unrelieved by any medications at home. She also states that she has not been able to piece since 8 AM this morning and has had suprapubic abdominal pain. Feels that she has to urinate. She denies any headache, vision changes, lightheadedness, dizziness, chest pain, shortness of breath, melena, hematochezia, paresthesias.    Past Medical History:  Diagnosis Date  . Allergic rhinitis   . Anxiety   . Asthma   . Colon polyps    adenomatous  . Diverticulosis   . Dysrhythmia 1999   Dr Johnsie Cancel 2003  . Elevated LFTs   . Fatty liver disease, nonalcoholic   . GERD  (gastroesophageal reflux disease)   . Headache(784.0)    migraines with vision changes but no pain  . Heart murmur   . Hypertension   . IBS (irritable bowel syndrome)   . Obesity   . Pneumonia 2007  . Renal cell carcinoma ([place]) 2013  . Restless leg syndrome   . Sleep apnea    moderate; wears CPAP, uncertain about setting  . TIA (transient ischemic attack) 09/11/2014  . Ulcer     Patient Active Problem List   Diagnosis Date Noted  . Acute urinary retention 11/21/2016  . 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 Diverticulitis 11/20/2016  . Pain in left ankle and joints of left foot 08/13/2016  . Pain and swelling of left ankle 08/13/2016  . At risk for polypharmacy 10/02/2015  . Intractable migraine with aura with status migrainosus 10/02/2015  . Other specified transient cerebral ischemias 09/16/2014  . 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 Morbid obesity ([place]) 09/16/2014  . Headache 05/03/2013  . Preop exam for internal medicine 03/10/2012  . Kidney mass 03/10/2012  . UTI (lower urinary tract infection) 02/13/2012  . Excessive tearing 12/16/2011  . Leg pain 09/20/2011  . Acute bronchitis 06/27/2011  . Wheezing 06/27/2011  . Rosacea 05/07/2011  . Shoulder pain, right 05/07/2011  . OSA (obstructive sleep apnea) 01/31/2011  . Confusion state 11/26/2010  . ABNORMAL ELECTROCARDIOGRAM 08/28/2010  . Tinnitus 08/09/2010  . SINUSITIS, CHRONIC 08/09/2010  . Dizziness and giddiness 01/18/2010  . HYPERGLYCEMIA 08/23/2009  . LIVER FUNCTION TESTS,  ABNORMAL, HX OF 08/23/2009  . FATIGUE 01/16/2009  . PERSONAL HX COLONIC POLYPS 09/21/2008  . ADENOMATOUS COLONIC POLYP 09/20/2008  . ALLERGIC RHINITIS 04/28/2008  . RESTRICTIVE LUNG DISEASE 04/28/2008  . CRAMPS,LEG 09/28/2007  . OBESITY 04/01/2007  . VOCAL CORD DISORDER 04/01/2007  . REDUCTION MAMMOPLASTY, HX OF 04/01/2007  . RESTLESS LEG SYNDROME 02/02/2007  . Essential hypertension  02/02/2007  . MITRAL VALVE PROLAPSE 02/02/2007  . GERD 02/02/2007  . Irritable bowel syndrome 02/02/2007  . BREAST CYST 02/02/2007    Past Surgical History:  Procedure Laterality Date  . ABDOMINAL HYSTERECTOMY  1979  . BREAST REDUCTION SURGERY  Jan 1991  . ELBOW SURGERY Left   . KIDNEY SURGERY Right October 2013    Tumor removed   . TONSILLECTOMY      OB History    Gravida Para Term Preterm AB Living   0 0 0 0 0     SAB TAB Ectopic Multiple Live Births   0 0 0           Home Medications    Prior to Admission medications   Medication Sig Start Date End Date Taking? Authorizing Provider  aspirin EC 81 MG tablet Take 1 tablet (81 mg total) by mouth daily. 09/16/14  Yes Dohmeier, Asencion Partridge, MD  BIOTIN PO Take 1 capsule by mouth at bedtime.    Yes [provider]  clidinium-chlordiazePOXIDE (LIBRAX) 5-2.5 MG capsule Take 1 capsule by mouth 2 (two) times daily. 10/18/16  Yes Irene Shipper, MD  clonazePAM (KLONOPIN) 1 MG tablet Take 1 mg by mouth at bedtime.    Yes [provider]  diphenhydrAMINE (BENADRYL) 25 MG tablet Take 25 mg by mouth every 8 (eight) hours as needed for allergies.    Yes [provider]  esomeprazole (NEXIUM) 40 MG capsule Take 1 capsule (40 mg total) by mouth 2 (two) times daily. 11/08/16  Yes Irene Shipper, MD  fluticasone [facility]) 50 MCG/ACT nasal spray Place 2 sprays into the nose daily as needed for rhinitis or allergies.   Yes [provider]  linaclotide (LINZESS) 145 MCG CAPS capsule Take 1 capsule (145 mcg total) by mouth daily. 10/30/16  Yes Irene Shipper, MD  metaxalone (SKELAXIN) 800 MG tablet Take 400 mg by mouth at bedtime.    Yes [provider]  metoCLOPramide (REGLAN) 10 MG tablet Take 1 tablet (10 mg total) by mouth every 8 (eight) hours as needed for nausea (nausea/headache). 11/08/16  Yes [name], MD  metoprolol succinate (TOPROL-XL) 100 MG 24 hr tablet Take 100 mg by mouth every evening. Take  with or immediately following a meal.   Yes [provider]  MILK THISTLE PO Take 1 capsule by mouth at bedtime.    Yes [provider]  Multiple Vitamin (MULTIVITAMIN WITH MINERALS) TABS Take 1 tablet by mouth every evening.    Yes [provider]  olmesartan (BENICAR) 40 MG tablet Take 40 mg by mouth every evening.   Yes [provider]  spironolactone (ALDACTONE) 50 MG tablet Take 50 mg by mouth every morning.   Yes [provider]    Family History Family History  Problem Relation Age of Onset  . Heart disease Father 18  . Lung cancer Father   . Heart disease Brother   . Hypertension Other   . Colon cancer Cousin   . Allergies Daughter   . Allergies Brother   . Melanoma Daughter   . Liver disease Neg Hx   .  Asthma Neg Hx     Social History Social History  Substance Use Topics  . Smoking status: Never Smoker  . Smokeless tobacco: Never Used  . Alcohol use 3.6 oz/week    6 Glasses of wine per week     Comment: 3-4 times a week     Allergies   Buprenorphine hcl; Morphine and related; Morphine and related; Aspirin; Avelox [moxifloxacin hcl in nacl]; Diflucan [fluconazole]; Diflucan [fluconazole]; Latex; and Moxifloxacin   Review of Systems Review of Systems  Constitutional: Positive for chills and fever.  HENT: Negative for congestion.   Eyes: Negative for visual disturbance.  Respiratory: Negative for cough and shortness of breath.   Cardiovascular: Negative for chest pain.  Gastrointestinal: Positive for abdominal pain and nausea. Negative for diarrhea and vomiting.  Genitourinary: Positive for decreased urine volume and difficulty urinating. Negative for dysuria, flank pain, frequency, pelvic pain, urgency, vaginal bleeding and vaginal discharge.  Musculoskeletal: Negative for back pain.  Skin: Negative.   Neurological: Negative for dizziness, syncope, weakness, light-headedness, numbness and headaches.     Physical  Exam Updated Vital Signs BP 96/62 (BP Location: Left Arm)   Pulse 85   Temp 99.2 F (37.3 C) (Oral)   Resp 20   Ht 5' 3"  (1.6 m)   Wt 101.8 kg   SpO2 97%   BMI 39.76 kg/m   Physical Exam  Constitutional: She is oriented to person, place, and time. She appears well-developed and well-nourished. She appears distressed (due to pain).  Nontoxic appearing.  HENT:  Head: Normocephalic and atraumatic.  Mouth/Throat: Oropharynx is clear and moist.  Eyes: Conjunctivae are normal. Right eye exhibits no discharge. Left eye exhibits no discharge. No scleral icterus.  Neck: Normal range of motion. Neck supple. No thyromegaly present.  Cardiovascular: Normal rate, regular rhythm, normal heart sounds and intact distal pulses.   Pulmonary/Chest: Effort normal and breath sounds normal. No respiratory distress. She has no wheezes. She has no rales. She exhibits no tenderness.  Abdominal: Soft. Bowel sounds are normal. She exhibits no distension and no ascites. There is tenderness in the right lower quadrant, suprapubic area and left lower quadrant. There is rebound and guarding. There is no rigidity, no CVA tenderness, no tenderness at McBurney's point and negative Murphy's sign.  Musculoskeletal: Normal range of motion.  Lymphadenopathy:    She has no cervical adenopathy.  Neurological: She is alert and oriented to person, place, and time.  Skin: Skin is warm and dry. Capillary refill takes less than 2 seconds.  Nursing note and vitals reviewed.    ED Treatments / Results  Labs (all labs ordered are listed, but only abnormal results are displayed) Labs Reviewed  COMPREHENSIVE METABOLIC PANEL - Abnormal; Notable for the following:       Result Value   CO2 21 (*)    Creatinine, Ser 1.02 (*)    ALT 69 (*)    GFR calc non Af Amer 55 (*)    All other components within normal limits  CBC WITH DIFFERENTIAL/PLATELET - Abnormal; Notable for the following:    WBC 11.8 (*)    Neutro Abs 9.2 (*)     All other components within normal limits  URINALYSIS, ROUTINE W REFLEX MICROSCOPIC - Abnormal; Notable for the following:    Color, Urine STRAW (*)    All other components within normal limits  URINE CULTURE  LIPASE, BLOOD  BASIC METABOLIC PANEL  CBC  I-STAT CG4 LACTIC ACID, ED    EKG  EKG  Interpretation None       Radiology Ct Abdomen Pelvis W Contrast  Result Date: 11/20/2016 CLINICAL DATA:  Abdominal pain and distention x2 days with rebound tenderness in the right lower quadrant. EXAM: CT ABDOMEN AND PELVIS WITH CONTRAST TECHNIQUE: Multidetector CT imaging of the abdomen and pelvis was performed using the standard protocol following bolus administration of intravenous contrast. CONTRAST:  37m ISOVUE-300 IOPAMIDOL (ISOVUE-300) INJECTION 61% COMPARISON:  11/20/2015 and 02/06/2012 CT FINDINGS: Lower chest: Normal size cardiac chambers. There is atelectasis and/or scarring at the lung bases. No pneumonic consolidation or effusion. Calcified right hilar lymph nodes are present possibly from old granulomatous disease. Hepatobiliary: Hepatic steatosis. Unremarkable gallbladder. No biliary dilatation or hepatic mass. Pancreas: Normal Spleen: Normal Adrenals/Urinary Tract: Normal bilateral adrenal glands. Partial nephrectomy of the right kidney. Tiny too small to further characterize hypodensities are seen within both kidneys statistically consistent with cysts. No nephrolithiasis. No hydroureteronephrosis. The urinary bladder is contracted and decompressed by Foley catheter. Stomach/Bowel: Pericolonic inflammation along the distal sigmoid colon with mild mural thickening consistent with acute uncomplicated diverticulitis Vascular/Lymphatic: No significant vascular findings are present. Mild atherosclerosis of the common iliac arteries bilaterally. No aneurysm of the aorta. No enlarged abdominal or pelvic lymph nodes. Reproductive: Hysterectomy. Tiny 9 mm cyst in the left adnexa without worrisome  features. Other: No abdominal wall hernia or abnormality. No abdominopelvic ascites. Musculoskeletal: No acute or significant osseous findings. IMPRESSION: 1. Acute uncomplicated sigmoid diverticulitis. No bowel obstruction or abscess. 2. Status post right partial nephrectomy without evidence local recurrence or metastatic disease. Tiny too small to further characterize hypodensities appear stable within both kidneys. 3. Benign-appearing 9 mm cyst in the left adnexum. Status post hysterectomy. Electronically Signed   By: DAshley RoyaltyM.D.   On: 11/20/2016 20:52    Procedures Procedures (including critical care time)  Medications Ordered in ED Medications  iopamidol (ISOVUE-300) 61 % injection (not administered)  pantoprazole (PROTONIX) EC tablet 40 mg (not administered)  metoCLOPramide (REGLAN) tablet 10 mg (not administered)  linaclotide (LINZESS) capsule 145 mcg (not administered)  clidinium-chlordiazePOXIDE (LIBRAX) 2.5-5 mg per capsule (not administered)  aspirin EC tablet 81 mg (not administered)  clonazePAM (KLONOPIN) tablet 1 mg (not administered)  metaxalone (SKELAXIN) tablet 400 mg (not administered)  metoprolol succinate (TOPROL-XL) 24 hr tablet 100 mg (not administered)  irbesartan (AVAPRO) tablet 300 mg (not administered)  enoxaparin (LOVENOX) injection 40 mg (not administered)  acetaminophen (TYLENOL) tablet 650 mg (not administered)    Or  acetaminophen (TYLENOL) suppository 650 mg (not administered)  ondansetron (ZOFRAN) tablet 4 mg (not administered)    Or  ondansetron (ZOFRAN) injection 4 mg (not administered)  lactated ringers infusion (not administered)  piperacillin-tazobactam (ZOSYN) IVPB 3.375 g (not administered)  sodium chloride 0.9 % bolus 1,000 mL (1,000 mLs Intravenous New Bag/Given 11/20/16 1813)  ondansetron (ZOFRAN) injection 4 mg (4 mg Intravenous Given 11/20/16 1810)  HYDROmorphone (DILAUDID) injection 0.5 mg (0.5 mg Intravenous Given 11/20/16 1812)    iopamidol (ISOVUE-300) 61 % injection 75 mL (75 mLs Intravenous Contrast Given 11/20/16 2027)  fentaNYL (SUBLIMAZE) injection 25 mcg (25 mcg Intravenous Given 11/20/16 2119)  amoxicillin-clavulanate (AUGMENTIN) 875-125 MG per tablet 1 tablet (1 tablet Oral Given 11/20/16 2212)  ondansetron (ZOFRAN) injection 4 mg (4 mg Intravenous Given 11/20/16 2211)     Initial Impression / Assessment and Plan / ED Course  I have reviewed the triage vital signs and the nursing notes.  Pertinent labs & imaging results that were available during my care of the  patient were reviewed by me and considered in my medical decision making (see chart for details).     The patient presents to the emergency Department today with complaints of lower abdominal pain that has been worsening over the past 2 days along with subjective fevers, decreased urine output. Patient does have rebound tenderness to the diffuse lower abdomen. She is nontoxic appearing. Low grade oral temporal 100.1. Heart rate is normal. No hypotension is noted.  Labs revealed a mild leukocytosis of 11,000. Lipase is normal. Hemoglobin is normal. Electrolytes and creatinine are normal. Given patient's diffuse tenderness ordered CT scan. Did bladder scan patient revealed 600 mL's of urine. Given patient's acute urinary retention with 600 mL of urine in the bladder. Placed with significant improvement in patient's pressure. CT scan does note a acute uncomplicated diverticulitis.the patient is able tolerate by mouth fluids. She does have a allergy to Avelox so cannot give her ciprofloxacin. Will start patient on Augmentin.  Nurses informed me that patient was very upset. She did not feel that she could go home with a Foley, being sick, not feeling well. Went back to reevaluate patient. Her temperature remains low grade. Blood pressure and heart rate remained normal. She does complain of nausea. Has had several rounds of nausea medicine and pain medicine with  little relief in her pain and nausea. Does report a migraine. Given patient's age and significant comorbidities along with acute urinary retention of diverticulitis. The patient may need observations and the [place] with Dr. Lorin Mercy with hospital medicine who agrees to admit patient to the hospital for. The patient was updated on plan of care and is agreeable to above plan. Remains hemodynamically stable in no acute distress.the patient was seen and evaluated by my attending Dr. Leonette Monarch who is agreeable with above plan.  Final Clinical Impressions(s) / ED Diagnoses   Final diagnoses:  Diverticulitis  Urinary retention    New Prescriptions Current Discharge Medication List       Aaron Edelman 11/21/16 0035    Fatima Blank, MD 11/21/16 [phone]

## 2016-11-22 ENCOUNTER — Ambulatory Visit (INDEPENDENT_AMBULATORY_CARE_PROVIDER_SITE_OTHER): Payer: Medicare Other | Admitting: Orthopaedic Surgery

## 2016-11-22 LAB — COMPREHENSIVE METABOLIC PANEL
ALK PHOS: 70 U/L (ref 38–126)
ALT: 65 U/L — AB (ref 14–54)
AST: 40 U/L (ref 15–41)
Albumin: 3.3 g/dL — ABNORMAL LOW (ref 3.5–5.0)
Anion gap: 9 (ref 5–15)
BUN: 17 mg/dL (ref 6–20)
CALCIUM: 8.9 mg/dL (ref 8.9–10.3)
CO2: 23 mmol/L (ref 22–32)
CREATININE: 1.16 mg/dL — AB (ref 0.44–1.00)
Chloride: 106 mmol/L (ref 101–111)
GFR, EST AFRICAN AMERICAN: 55 mL/min — AB (ref >60)
GFR, EST NON AFRICAN AMERICAN: 47 mL/min — AB (ref >60)
Glucose, Bld: 93 mg/dL (ref 65–99)
Potassium: 3.9 mmol/L (ref 3.5–5.1)
Sodium: 138 mmol/L (ref 135–145)
Total Bilirubin: 0.7 mg/dL (ref 0.3–1.2)
Total Protein: 6 g/dL — ABNORMAL LOW (ref 6.5–8.1)

## 2016-11-22 LAB — URINE CULTURE: Culture: NO GROWTH

## 2016-11-22 LAB — CBC
HCT: 31.9 % — ABNORMAL LOW (ref 36.0–46.0)
Hemoglobin: 10.7 g/dL — ABNORMAL LOW (ref 12.0–15.0)
MCH: 31.7 pg (ref 26.0–34.0)
MCHC: 33.5 g/dL (ref 30.0–36.0)
MCV: 94.4 fL (ref 78.0–100.0)
PLATELETS: 168 10*3/uL (ref 150–400)
RBC: 3.38 MIL/uL — AB (ref 3.87–5.11)
RDW: 12.6 % (ref 11.5–15.5)
WBC: 5.3 10*3/uL (ref 4.0–10.5)

## 2016-11-22 MED ORDER — AMOXICILLIN-POT CLAVULANATE 875-125 MG PO TABS
1.0000 | ORAL_TABLET | Freq: Two times a day (BID) | ORAL | Status: DC
  Administered 2016-11-22 – 2016-11-23 (×2): 1 via ORAL
  Filled 2016-11-22 (×2): qty 1

## 2016-11-22 NOTE — Progress Notes (Signed)
PROGRESS NOTE  Jamie [name]:[id number] DOB: [birth year] DOA: 11/20/2016 PCP: Deland Pretty, MD   LOS: 1 day   Brief Narrative: 71 year old female with history of TIA, obstructive sleep apnea on CPAP, renal cell carcinoma status post partial nephrectomy, hypertension, urinary incontinence, was admitted on 5/16 with abdominal pain, bloating and urinary retention.  She was diagnosed recently with a urinary tract infection and has been on Keflex for a few days prior to coming to the hospital.  She also complains of poor p.o. intake, and inability to have a bowel movement or pass gas.  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 -Patient's physical exam seemed to be a little bit out of proportion with the CT scan findings on 5/17 -Clinically improved today, advance diet, start with clear liquids -If she tolerates this well, expect to be transitioned to p.o. antibiotics and upgraded liquids tonight, and potentially home 5/19  Urinary retention -Patient with urinary incontinence foliaceous followed at [facility].  She does not recall having retention issues in the past.  I wonder whether this is related to #1 local irritation versus UTI.  She has been on antibiotics already, urinalysis does not show any evidence of UTI.  -Urine culture has remained negative -We will give a voiding trial today, remove Foley  Hypertension -Continue home medications, blood pressure controlled this morning 131/65  Obstructive sleep apnea -Continue CPAP, able to get her home machine and she is more comfortable overnight  GERD -On  Protonix   DVT prophylaxis: Lovenox Code Status: Full code Family Communication: Discussed with her husband  at bedside Disposition Plan: Home when ready, likely 1 day if she tolerates diet advancement  Consultants:   None  Procedures:   None   Antimicrobials:  Zosyn 5/16 >>   Subjective: -Feels a little better today, less pain, she was able to pass some gas, and was able to ambulate in the hallway  Objective: Vitals:   11/21/16 1430 11/21/16 1748 11/21/16 2200 11/22/16 0520  BP: 98/60 99/60 111/62 131/65  Pulse: 71 68 71 69  Resp: 18  18 16   Temp: 98.5 F (36.9 C)  98.5 F (36.9 C) 98 F (36.7 C)  TempSrc: Oral  Oral Oral  SpO2: 97%  96% 99%  Weight:      Height:        Intake/Output Summary (Last 24 hours) at 11/22/16 1308 Last data filed at 11/22/16 1233  Gross per 24 hour  Intake             2600 ml  Output             1800 ml  Net              800 ml   Filed Weights   11/20/16 1648 11/21/16 0020  Weight: 99.8 kg (220 lb) 101.8 kg (224 lb 6.9 oz)    Examination:  Vitals:   11/21/16 1430 11/21/16 1748 11/21/16 2200 11/22/16 0520  BP: 98/60 99/60 111/62 131/65  Pulse: 71 68 71 69  Resp: 18  18 16   Temp: 98.5 F (36.9 C)  98.5 F (36.9 C) 98 F (36.7 C)  TempSrc: Oral  Oral Oral  SpO2: 97%  96% 99%  Weight:      Height:       Constitutional: NAD, calm, comfortable Eyes: lids and conjunctivae normal Respiratory: clear to auscultation bilaterally, no wheezing, no crackles. Normal respiratory effort.  Cardiovascular: Regular rate and rhythm, no murmurs / rubs / gallops. No LE edema. 2+ pedal pulses.  Abdomen: Very mild tenderness in the left lower quadrant, no guarding, no rebound. bowel sounds positive.  Skin: no rashes, lesions, ulcers. No induration Neurologic: non focal   Data Reviewed: I have personally reviewed following labs and imaging studies  CBC:  Recent Labs Lab 11/20/16 2004 11/21/16 0444 11/22/16 0457  WBC 11.8* 9.1 5.3    NEUTROABS 9.2*  --   --   HGB 13.2 12.0 10.7*  HCT 39.8 36.8 31.9*  MCV 94.8 95.3 94.4  PLT 186 205 631   Basic Metabolic Panel:  Recent Labs Lab 11/20/16 2004 11/21/16 0444 11/22/16 0457  NA 136 135 138  K 4.4 4.0 3.9  CL 106 104 106  CO2 21* 22 23  GLUCOSE 91 104* 93  BUN 20 19 17   CREATININE 1.02* 1.02* 1.16*  CALCIUM 9.2 9.1 8.9   GFR: Estimated Creatinine Clearance: 52.9 mL/min (A) (by C-G formula based on SCr of 1.16 mg/dL (H)). Liver Function Tests:  Recent Labs Lab 11/20/16 2004 11/22/16 0457  AST 40 40  ALT 69* 65*  ALKPHOS 67 70  BILITOT 0.7 0.7  PROT 7.2 6.0*  ALBUMIN 3.9 3.3*    Recent Labs Lab 11/20/16 2004  LIPASE 35   No results for input(s): AMMONIA in the last 168 hours. Coagulation Profile: No results for input(s): INR, PROTIME in the last 168 hours. Cardiac Enzymes: No results for input(s): CKTOTAL, CKMB, CKMBINDEX, TROPONINI in the last 168 hours. BNP (last 3 results) No results for input(s): PROBNP in the last 8760 hours. HbA1C: No results for input(s): HGBA1C in the last 72 hours. CBG: No results for input(s): GLUCAP in the last 168 hours. Lipid Profile: No results for input(s): CHOL, HDL, LDLCALC, TRIG, CHOLHDL, LDLDIRECT in the last 72 hours. Thyroid Function Tests: No results for input(s): TSH, T4TOTAL, FREET4, T3FREE, THYROIDAB in the last 72 hours. Anemia Panel: No results for input(s): VITAMINB12, FOLATE, FERRITIN, TIBC, IRON, RETICCTPCT in the last 72 hours. Urine analysis:    Component Value Date/Time   COLORURINE STRAW (A) 11/20/2016 1738   APPEARANCEUR CLEAR 11/20/2016 1738   LABSPEC 1.006 11/20/2016 1738   PHURINE 5.0 11/20/2016 1738   GLUCOSEU NEGATIVE 11/20/2016 1738   GLUCOSEU NEGATIVE 02/14/2012 1524   HGBUR NEGATIVE 11/20/2016 [place] 11/20/2016 [place] 11/20/2016 1738   PROTEINUR NEGATIVE 11/20/2016 1738   UROBILINOGEN 0.2 02/23/2012 1623   NITRITE NEGATIVE  11/20/2016 1738   LEUKOCYTESUR NEGATIVE 11/20/2016 1738   Sepsis Labs: Invalid input(s): PROCALCITONIN, LACTICIDVEN  Recent Results (from the past 240 hour(s))  Culture, Urine     Status: None   Collection Time: 11/21/16 12:06 AM  Result Value Ref Range Status   Specimen Description URINE, CLEAN CATCH  Final   Special Requests NONE  Final   Culture   Final    NO GROWTH Performed at [facility], [address] [place], [place] [address]    Report Status 11/22/2016 FINAL  Final      Radiology Studies: Ct Abdomen Pelvis W Contrast  Result Date: 11/20/2016 CLINICAL DATA:  Abdominal pain and distention x2 days with rebound tenderness in the right  lower quadrant. EXAM: CT ABDOMEN AND PELVIS WITH CONTRAST TECHNIQUE: Multidetector CT imaging of the abdomen and pelvis was performed using the standard protocol following bolus administration of intravenous contrast. CONTRAST:  77m ISOVUE-300 IOPAMIDOL (ISOVUE-300) INJECTION 61% COMPARISON:  11/20/2015 and 02/06/2012 CT FINDINGS: Lower chest: Normal size cardiac chambers. There is atelectasis and/or scarring at the lung bases. No pneumonic consolidation or effusion. Calcified right hilar lymph nodes are present possibly from old granulomatous disease. Hepatobiliary: Hepatic steatosis. Unremarkable gallbladder. No biliary dilatation or hepatic mass. Pancreas: Normal Spleen: Normal Adrenals/Urinary Tract: Normal bilateral adrenal glands. Partial nephrectomy of the right kidney. Tiny too small to further characterize hypodensities are seen within both kidneys statistically consistent with cysts. No nephrolithiasis. No hydroureteronephrosis. The urinary bladder is contracted and decompressed by Foley catheter. Stomach/Bowel: Pericolonic inflammation along the distal sigmoid colon with mild mural thickening consistent with acute uncomplicated diverticulitis Vascular/Lymphatic: No significant vascular findings are present. Mild atherosclerosis of the  common iliac arteries bilaterally. No aneurysm of the aorta. No enlarged abdominal or pelvic lymph nodes. Reproductive: Hysterectomy. Tiny 9 mm cyst in the left adnexa without worrisome features. Other: No abdominal wall hernia or abnormality. No abdominopelvic ascites. Musculoskeletal: No acute or significant osseous findings. IMPRESSION: 1. Acute uncomplicated sigmoid diverticulitis. No bowel obstruction or abscess. 2. Status post right partial nephrectomy without evidence local recurrence or metastatic disease. Tiny too small to further characterize hypodensities appear stable within both kidneys. 3. Benign-appearing 9 mm cyst in the left adnexum. Status post hysterectomy. Electronically Signed   By: DAshley RoyaltyM.D.   On: 11/20/2016 20:52     Scheduled Meds: . aspirin EC  81 mg Oral Daily  . chlorhexidine  15 mL Mouth Rinse BID  . clidinium-chlordiazePOXIDE  1 capsule Oral BID  . clonazePAM  1 mg Oral QHS  . enoxaparin (LOVENOX) injection  40 mg Subcutaneous Q24H  . irbesartan  300 mg Oral Daily  . linaclotide  145 mcg Oral Daily  . mouth rinse  15 mL Mouth Rinse q12n4p  . metaxalone  400 mg Oral QHS  . metoprolol succinate  100 mg Oral QPM  . pantoprazole  40 mg Oral Daily   Continuous Infusions: . piperacillin-tazobactam (ZOSYN)  IV 3.375 g (11/22/16 1212)      CMarzetta Board MD, PhD [facility] Pager [phone] If 7PM-7AM, please contact night-coverage [email or web address] Password [email or web address] 11/22/2016, 1:08 PM

## 2016-11-23 MED ORDER — HYDROCODONE-ACETAMINOPHEN 5-325 MG PO TABS: 1.0000 | ORAL_TABLET | Freq: Four times a day (QID) | ORAL | 0 refills | Status: DC | PRN

## 2016-11-23 MED ORDER — AMOXICILLIN-POT CLAVULANATE 875-125 MG PO TABS: 1.0000 | ORAL_TABLET | Freq: Two times a day (BID) | ORAL | 0 refills | Status: DC

## 2016-11-23 NOTE — Discharge Instructions (Addendum)
Diverticulitis Diverticulitis is when small pockets in your large intestine (colon) get infected or swollen. This causes stomach pain and watery poop (diarrhea). These pouches are called diverticula. They form in people who have a condition called diverticulosis. Follow these instructions at home: Medicines   Take over-the-counter and prescription medicines only as told by your doctor. These include:  Antibiotics.  Pain medicines.  Fiber pills.  Probiotics.  Stool softeners.  Do not drive or use heavy machinery while taking prescription pain medicine.  If you were prescribed an antibiotic, take it as told. Do not stop taking it even if you feel better. General instructions   Follow a diet as told by your doctor.  When you feel better, your doctor may tell you to change your diet. You may need to eat a lot of fiber. Fiber makes it easier to poop (have bowel movements). Healthy foods with fiber include:  Berries.  Beans.  Lentils.  Green vegetables.  Exercise 3 or more times a week. Aim for 30 minutes each time. Exercise enough to sweat and make your heart beat faster.  Keep all follow-up visits as told. This is important. You may need to have an exam of the large intestine. This is called a colonoscopy. Contact a doctor if:  Your pain does not get better.  You have a hard time eating or drinking.  You are not pooping like normal. Get help right away if:  Your pain gets worse.  Your problems do not get better.  Your problems get worse very fast.  You have a fever.  You throw up (vomit) more than one time.  You have poop that is:  Bloody.  Black.  [place]. Summary  Diverticulitis is when small pockets in your large intestine (colon) get infected or swollen.  Take medicines only as told by your doctor.  Follow a diet as told by your doctor. This information is not intended to replace advice given to you by your health care provider. Make sure you  discuss any questions you have with your health care provider. Document Released: 12/11/2007 Document Revised: 07/11/2016 Document Reviewed: 07/11/2016 Elsevier Interactive Patient Education  2017 [place].   Follow with Jamie Pretty, MD in 5-7 days  Please get a complete blood count and chemistry panel checked by your Primary MD at your next visit, and again as instructed by your Primary MD. Please get your medications reviewed and adjusted by your Primary MD.  Please request your Primary MD to go over all Hospital Tests and Procedure/Radiological results at the follow up, please get all Hospital records sent to your Prim MD by signing hospital release before you go home.  If you had Pneumonia of Lung problems at the Hospital: Please get a 2 view Chest X ray done in 6-8 weeks after hospital discharge or sooner if instructed by your Primary MD.  If you have Congestive Heart Failure: Please call your Cardiologist or Primary MD anytime you have any of the following symptoms:  1) 3 pound weight gain in 24 hours or 5 pounds in 1 week  2) shortness of breath, with or without a dry hacking cough  3) swelling in the hands, feet or stomach  4) if you have to sleep on extra pillows at night in order to breathe  Follow cardiac low salt diet and 1.5 lit/day fluid restriction.  If you have diabetes Accuchecks 4 times/day, Once in AM empty stomach and then before each meal. Log in all results and show  them to your primary doctor at your next visit. If any glucose reading is under 80 or above 300 call your primary MD immediately.  If you have Seizure/Convulsions/Epilepsy: Please do not drive, operate heavy machinery, participate in activities at heights or participate in high speed sports until you have seen by Primary MD or a Neurologist and advised to do so again.  If you had Gastrointestinal Bleeding: Please ask your Primary MD to check a complete blood count within one week of discharge  or at your next visit. Your endoscopic/colonoscopic biopsies that are pending at the time of discharge, will also need to followed by your Primary MD.  Get Medicines reviewed and adjusted. Please take all your medications with you for your next visit with your Primary MD  Please request your Primary MD to go over all hospital tests and procedure/radiological results at the follow up, please ask your Primary MD to get all Hospital records sent to his/her office.  If you experience worsening of your admission symptoms, develop shortness of breath, life threatening emergency, suicidal or homicidal thoughts you must seek medical attention immediately by calling 911 or calling your MD immediately  if symptoms less severe.  You must read complete instructions/literature along with all the possible adverse reactions/side effects for all the Medicines you take and that have been prescribed to you. Take any new Medicines after you have completely understood and accpet all the possible adverse reactions/side effects.   Do not drive or operate heavy machinery when taking Pain medications.   Do not take more than prescribed Pain, Sleep and Anxiety Medications  Special Instructions: If you have smoked or chewed Tobacco  in the last 2 yrs please stop smoking, stop any regular Alcohol  and or any Recreational drug use.  Wear Seat belts while driving.  Please note You were cared for by a hospitalist during your hospital stay. If you have any questions about your discharge medications or the care you received while you were in the hospital after you are discharged, you can call the unit and asked to speak with the hospitalist on call if the hospitalist that took care of you is not available. Once you are discharged, your primary care physician will handle any further medical issues. Please note that NO REFILLS for any discharge medications will be authorized once you are discharged, as it is imperative that you  return to your primary care physician (or establish a relationship with a primary care physician if you do not have one) for your aftercare needs so that they can reassess your need for medications and monitor your lab values.  You can reach the hospitalist office at phone [phone] or fax [phone]   If you do not have a primary care physician, you can call [phone] for a physician referral.  Activity: As tolerated with Full fall precautions use walker/cane & assistance as needed  Diet: regular  Disposition Home

## 2016-11-23 NOTE — Discharge Summary (Signed)
Physician Discharge Summary  Jamie [name] VEH:[id number] DOB: 1945/12/01 DOA: 11/20/2016  PCP: Deland Pretty, MD  Admit date: 11/20/2016 Discharge date: 11/23/2016  Admitted From: Home Disposition: Home  Recommendations for Outpatient Follow-up:  1. Follow up with PCP in 1-2 weeks 2. Continue Augmentin for 10 additional days  Home Health: None Equipment/Devices: None  Discharge Condition: Stable CODE STATUS: Full code Diet recommendation: Heart healthy  HPI: Per Dr. Lorin Mercy, Jamie [name] is a 71 y.o. female with medical history significant of TIA; OSA on CPAP; renal cell CA s/p partial nephrectomy; HTN; and diverticulosis presenting with abdominal pain.  Monday night, she started pooping several times.  "Sort of mixed up", kind of loose.  Pain that night, difficulty sleeping.  Slept most of the day Tuesday.  This AM about 1000 the symptoms worsened and she felt terrible ever since.  "Everything was so bloated, so much gas", and yet unable to pee.  Bladder pain. Already on antibiotics for UTI - 3rd UTI since the end of March.  Started Saturday with Keflex x 7 days.  +fever, subjective.  101 at the doctor's office today.  Pain is across the lower abdomen with radiation into the lower back.  Also with excruciating headache; she does have migraines and this feels similar to usual although the onset was somewhat different.  No n/v but minimal PO intake. 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  Initial CT scan of the abdomen and pelvis showed acute  uncomplicated sigmoid diverticulitis without any bowel obstruction or abscess.  She was initially treated conservatively with IV Zosyn, n.p.o. and IV fluids.  Her abdominal pain improved, and her diet was slowly transitioned to clear liquids, fulls and then soft.  Her antibiotics were transitioned to Augmentin, she tolerated that well, had no further abdominal pain, no nausea or vomiting and she was afebrile.  She was discharged home in stable condition with 10 additional days of Augmentin to complete a total of 14 day course.  She is followed by gastroenterology as an outpatient, she in fact is supposed to see her gastroenterologist Dr. PHenrene Pastornext month for a routine colonoscopy.  Urinary retention -patient with [facility] with no prior history of retention.  She had a Foley catheter placed in the ED, this was kept for 24 hours, removed without any further retention and she was able to void well on her own.  This was probably due to local irritation from diverticulitis Hypertension -continue home medications Obstructive sleep apnea -continue CPAP Obesity with fatty liver disease on CT scan -discussed extensively with the patient, recommended weight loss   Discharge Instructions   Allergies as of 11/23/2016      Reactions   Buprenorphine Hcl Rash, Shortness Of Breath   Morphine And Related Shortness Of Breath, Rash   Morphine And Related Shortness Of Breath   Aspirin Other (See Comments)   REACTION: ulcers Can take 81 mg   Avelox [moxifloxacin Hcl In Nacl] Hives   Diflucan [fluconazole] Hives   Whelps and rash c skin peeling   Diflucan [fluconazole] Hives  Latex Rash   Moxifloxacin Palpitations   Tachycardia      Medication List    TAKE these medications   amoxicillin-clavulanate 875-125 MG tablet Commonly known as:  AUGMENTIN Take 1 tablet by mouth every 12 (twelve) hours.   aspirin EC 81 MG tablet Take 1 tablet (81 mg  total) by mouth daily.   BIOTIN PO Take 1 capsule by mouth at bedtime.   clidinium-chlordiazePOXIDE 5-2.5 MG capsule Commonly known as:  LIBRAX Take 1 capsule by mouth 2 (two) times daily.   clonazePAM 1 MG tablet Commonly known as:  KLONOPIN Take 1 mg by mouth at bedtime.   diphenhydrAMINE 25 MG tablet Commonly known as:  BENADRYL Take 25 mg by mouth every 8 (eight) hours as needed for allergies.   esomeprazole 40 MG capsule Commonly known as:  NEXIUM Take 1 capsule (40 mg total) by mouth 2 (two) times daily.   fluticasone 50 MCG/ACT nasal spray Commonly known as:  FLONASE Place 2 sprays into the nose daily as needed for rhinitis or allergies.   HYDROcodone-acetaminophen 5-325 MG tablet Commonly known as:  NORCO Take 1 tablet by mouth every 6 (six) hours as needed for moderate pain.   linaclotide 145 MCG Caps capsule Commonly known as:  LINZESS Take 1 capsule (145 mcg total) by mouth daily.   metaxalone 800 MG tablet Commonly known as:  SKELAXIN Take 400 mg by mouth at bedtime.   metoCLOPramide 10 MG tablet Commonly known as:  REGLAN Take 1 tablet (10 mg total) by mouth every 8 (eight) hours as needed for nausea (nausea/headache).   metoprolol succinate 100 MG 24 hr tablet Commonly known as:  TOPROL-XL Take 100 mg by mouth every evening. Take with or immediately following a meal.   MILK THISTLE PO Take 1 capsule by mouth at bedtime.   multivitamin with minerals Tabs tablet Take 1 tablet by mouth every evening.   olmesartan 40 MG tablet Commonly known as:  BENICAR Take 40 mg by mouth every evening.   spironolactone 50 MG tablet Commonly known as:  ALDACTONE Take 50 mg by mouth every morning.      Follow-up Information    Deland Pretty, MD. Schedule an appointment as soon as possible for a visit in 2 week(s).   Specialty:  Internal Medicine Contact information: [address] [place] [address] [phone]        Irene Shipper,  MD. Schedule an appointment as soon as possible for a visit in 3 week(s).   Specialty:  Gastroenterology Contact information: [address] [address] [phone]          Allergies  Allergen Reactions  . Buprenorphine Hcl Rash and Shortness Of Breath  . Morphine And Related Shortness Of Breath and Rash  . Morphine And Related Shortness Of Breath  . Aspirin Other (See Comments)    REACTION: ulcers  Can take 81 mg  . Avelox [Moxifloxacin Hcl In Nacl] Hives  . Diflucan [Fluconazole] Hives    Whelps and rash c skin peeling  . Diflucan [Fluconazole] Hives  . Latex Rash  . Moxifloxacin Palpitations    Tachycardia     Consultations:  None  Procedures/Studies:  Ct Abdomen Pelvis W Contrast  Result Date: 11/20/2016 CLINICAL DATA:  Abdominal pain and distention x2 days with rebound tenderness in the right lower quadrant. EXAM: CT ABDOMEN AND PELVIS WITH CONTRAST TECHNIQUE: Multidetector CT imaging of the abdomen and pelvis was performed using the standard protocol following bolus  administration of intravenous contrast. CONTRAST:  40m ISOVUE-300 IOPAMIDOL (ISOVUE-300) INJECTION 61% COMPARISON:  11/20/2015 and 02/06/2012 CT FINDINGS: Lower chest: Normal size cardiac chambers. There is atelectasis and/or scarring at the lung bases. No pneumonic consolidation or effusion. Calcified right hilar lymph nodes are present possibly from old granulomatous disease. Hepatobiliary: Hepatic steatosis. Unremarkable gallbladder. No biliary dilatation or hepatic mass. Pancreas: Normal Spleen: Normal Adrenals/Urinary Tract: Normal bilateral adrenal glands. Partial nephrectomy of the right kidney. Tiny too small to further characterize hypodensities are seen within both kidneys statistically consistent with cysts. No nephrolithiasis. No hydroureteronephrosis. The urinary bladder is contracted and decompressed by Foley catheter. Stomach/Bowel: Pericolonic inflammation along the distal  sigmoid colon with mild mural thickening consistent with acute uncomplicated diverticulitis Vascular/Lymphatic: No significant vascular findings are present. Mild atherosclerosis of the common iliac arteries bilaterally. No aneurysm of the aorta. No enlarged abdominal or pelvic lymph nodes. Reproductive: Hysterectomy. Tiny 9 mm cyst in the left adnexa without worrisome features. Other: No abdominal wall hernia or abnormality. No abdominopelvic ascites. Musculoskeletal: No acute or significant osseous findings. IMPRESSION: 1. Acute uncomplicated sigmoid diverticulitis. No bowel obstruction or abscess. 2. Status post right partial nephrectomy without evidence local recurrence or metastatic disease. Tiny too small to further characterize hypodensities appear stable within both kidneys. 3. Benign-appearing 9 mm cyst in the left adnexum. Status post hysterectomy. Electronically Signed   By: DAshley RoyaltyM.D.   On: 11/20/2016 20:52   Mr Ankle Left W/o Contrast  Result Date: 11/12/2016 CLINICAL DATA:  History of left ankle injury in February, 2018. Continued pain. EXAM: MRI OF THE LEFT ANKLE WITHOUT CONTRAST TECHNIQUE: Multiplanar, multisequence MR imaging of the ankle was performed. No intravenous contrast was administered. COMPARISON:  [facility] FINDINGS: TENDONS Peroneal: Intact. Posteromedial: Intact. Anterior: Intact. Achilles: Intact. Plantar Fascia: Normal. LIGAMENTS Lateral: Intact. Medial: Intact. CARTILAGE Ankle Joint: Negative. Subtalar Joints/Sinus Tarsi: Normal. Bones: No fracture or worrisome lesion. There is some midfoot degenerative change with small osteophytes identified, most notable at the second tarsometatarsal joint. Mild subchondral edema at the calcaneocuboid joint is also seen. Other: None. IMPRESSION: No acute abnormality.  Negative for tendon or ligament tear. Mild appearing midfoot osteoarthritis. Electronically Signed   By:  TInge RiseM.D.   On: 11/12/2016 14:34      Subjective: - no chest pain, shortness of breath, no abdominal pain, nausea or vomiting.   Discharge Exam: Vitals:   11/22/16 2252 11/23/16 0550  BP: 126/60 (!) 114/49  Pulse: 71 (!) 59  Resp: 18 18  Temp: 98.4 F (36.9 C) 97.6 F (36.4 C)   Vitals:   11/22/16 0520 11/22/16 1500 11/22/16 2252 11/23/16 0550  BP: 131/65 123/76 126/60 (!) 114/49  Pulse: 69 81 71 (!) 59  Resp: 16 18 18 18   Temp: 98 F (36.7 C) 98.2 F (36.8 C) 98.4 F (36.9 C) 97.6 F (36.4 C)  TempSrc: Oral Oral Oral Oral  SpO2: 99% 97% 98% 100%  Weight:      Height:        General: Pt is alert, awake, not in acute distress Cardiovascular: RRR, S1/S2 +, no rubs, no gallops Respiratory: CTA bilaterally, no wheezing, no rhonchi Abdominal: Soft, NT, ND, bowel sounds + Extremities: no edema, no cyanosis    The results of significant diagnostics from this hospitalization (including imaging, microbiology, ancillary and laboratory) are listed below for reference.     Microbiology: Recent Results (from the past 240 hour(s))  Culture,  Urine     Status: None   Collection Time: 11/21/16 12:06 AM  Result Value Ref Range Status   Specimen Description URINE, CLEAN CATCH  Final   Special Requests NONE  Final   Culture   Final    NO GROWTH Performed at [facility], [address], [place], [place] [address]    Report Status 11/22/2016 FINAL  Final     Labs: BNP (last 3 results) No results for input(s): BNP in the last 8760 hours. Basic Metabolic Panel:  Recent Labs Lab 11/20/16 2004 11/21/16 0444 11/22/16 0457  NA 136 135 138  K 4.4 4.0 3.9  CL 106 104 106  CO2 21* 22 23  GLUCOSE 91 104* 93  BUN 20 19 17   CREATININE 1.02* 1.02* 1.16*  CALCIUM 9.2 9.1 8.9   Liver Function Tests:  Recent Labs Lab 11/20/16 2004 11/22/16 0457  AST 40 40  ALT 69* 65*  ALKPHOS 67 70  BILITOT 0.7 0.7  PROT 7.2 6.0*  ALBUMIN 3.9 3.3*    Recent  Labs Lab 11/20/16 2004  LIPASE 35   No results for input(s): AMMONIA in the last 168 hours. CBC:  Recent Labs Lab 11/20/16 2004 11/21/16 0444 11/22/16 0457  WBC 11.8* 9.1 5.3  NEUTROABS 9.2*  --   --   HGB 13.2 12.0 10.7*  HCT 39.8 36.8 31.9*  MCV 94.8 95.3 94.4  PLT 186 205 168   Cardiac Enzymes: No results for input(s): CKTOTAL, CKMB, CKMBINDEX, TROPONINI in the last 168 hours. BNP: Invalid input(s): POCBNP CBG: No results for input(s): GLUCAP in the last 168 hours. D-Dimer No results for input(s): DDIMER in the last 72 hours. Hgb A1c No results for input(s): HGBA1C in the last 72 hours. Lipid Profile No results for input(s): CHOL, HDL, LDLCALC, TRIG, CHOLHDL, LDLDIRECT in the last 72 hours. Thyroid function studies No results for input(s): TSH, T4TOTAL, T3FREE, THYROIDAB in the last 72 hours.  Invalid input(s): FREET3 Anemia work up No results for input(s): VITAMINB12, FOLATE, FERRITIN, TIBC, IRON, RETICCTPCT in the last 72 hours. Urinalysis    Component Value Date/Time   COLORURINE STRAW (A) 11/20/2016 1738   APPEARANCEUR CLEAR 11/20/2016 1738   LABSPEC 1.006 11/20/2016 1738   PHURINE 5.0 11/20/2016 1738   GLUCOSEU NEGATIVE 11/20/2016 1738   GLUCOSEU NEGATIVE 02/14/2012 1524   [place] 11/20/2016 [place] 11/20/2016 1738   KETONESUR NEGATIVE 11/20/2016 1738   PROTEINUR NEGATIVE 11/20/2016 1738   UROBILINOGEN 0.2 02/23/2012 1623   NITRITE NEGATIVE 11/20/2016 1738   LEUKOCYTESUR NEGATIVE 11/20/2016 1738   Sepsis Labs Invalid input(s): PROCALCITONIN,  WBC,  LACTICIDVEN Microbiology Recent Results (from the past 240 hour(s))  Culture, Urine     Status: None   Collection Time: 11/21/16 12:06 AM  Result Value Ref Range Status   Specimen Description URINE, CLEAN CATCH  Final   Special Requests NONE  Final   Culture   Final    NO GROWTH Performed at [facility], [place] [address], [place], [place] [address]    Report Status  11/22/2016 FINAL  Final     Time coordinating discharge: 45 minutes  SIGNED:  Marzetta Board, MD  [facility] 11/23/2016, 1:44 PM Pager [phone]  If 7PM-7AM, please contact night-coverage [email or web address] Password [email or web address]

## 2016-11-25 ENCOUNTER — Ambulatory Visit (INDEPENDENT_AMBULATORY_CARE_PROVIDER_SITE_OTHER): Payer: Medicare Other | Admitting: Orthopaedic Surgery

## 2016-11-25 ENCOUNTER — Encounter (INDEPENDENT_AMBULATORY_CARE_PROVIDER_SITE_OTHER): Payer: Self-pay | Admitting: Orthopaedic Surgery

## 2016-11-25 VITALS — BP 143/77 | HR 70 | Resp 14 | Ht 65.0 in | Wt 224.0 lb

## 2016-11-25 DIAGNOSIS — M79672 Pain in left foot: Secondary | ICD-10-CM

## 2016-11-25 NOTE — Progress Notes (Signed)
Office Visit Note   Patient: Jamie [name]           Date of Birth: Jun 15, 1946           MRN: [id number] Visit Date: 11/25/2016              Requested by: Deland Pretty, MD [address] [place], [place] [address] PCP: Deland Pretty, MD   Assessment & Plan: Visit Diagnoses:  1. Left foot pain   Midfoot osteoarthritis per MRI scan  Plan: Comfortable shoes, full arch supports. Follow up as needed and consider local cortisone injections. Shared MRI scan report with Mr. and Jamie [name]  Follow-Up Instructions: Return if symptoms worsen or fail to improve.   Orders:  No orders of the defined types were placed in this encounter.  No orders of the defined types were placed in this encounter.     Procedures: No procedures performed   Clinical Data: No additional findings.   Subjective: Chief Complaint  Patient presents with  . Left Ankle - Results    Jamie [name] is a 71 y o that is here for MRI results. She relates she just got out of the hospital for diverticulitis  Recently discharged from the hospital with a diagnosis of her diverticulitis. Still on antibiotics. Being followed up today for chronic left foot pain as previously identified. MRI scan demonstrates no other abnormalities other than midfoot osteoarthritis  HPI  Review of Systems   Objective: Vital Signs: BP (!) 143/77   Pulse 70   Resp 14   Ht 5' 5"  (1.651 m)   Wt 224 lb (101.6 kg)   BMI 37.28 kg/m   Physical Exam  Ortho Exam left foot without swelling. No ecchymosis or erythema. Excellent pulses. Normal sensibility. Mild diffuse midfoot discomfort. Normal arch. No hindfoot pain or Achilles tendon discomfort  Specialty Comments:  No specialty comments available.  Imaging: No results found.   PMFS History: Patient Active Problem List   Diagnosis Date Noted  . Acute urinary retention 11/21/2016  . Nonalcoholic fatty liver disease without nonalcoholic steatohepatitis  (NASH) 11/21/2016  . Diverticulitis 11/20/2016  . Pain in left ankle and joints of left foot 08/13/2016  . Pain and swelling of left ankle 08/13/2016  . At risk for polypharmacy 10/02/2015  . Intractable migraine with aura with status migrainosus 10/02/2015  . Other specified transient cerebral ischemias 09/16/2014  . 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 Morbid obesity ([place]) 09/16/2014  . Headache 05/03/2013  . Preop exam for internal medicine 03/10/2012  . Kidney mass 03/10/2012  . UTI (lower urinary tract infection) 02/13/2012  . Excessive tearing 12/16/2011  . Leg pain 09/20/2011  . Acute bronchitis 06/27/2011  . Wheezing 06/27/2011  . Rosacea 05/07/2011  . Shoulder pain, right 05/07/2011  . OSA (obstructive sleep apnea) 01/31/2011  . Confusion state 11/26/2010  . ABNORMAL ELECTROCARDIOGRAM 08/28/2010  . Tinnitus 08/09/2010  . SINUSITIS, CHRONIC 08/09/2010  . Dizziness and giddiness 01/18/2010  . HYPERGLYCEMIA 08/23/2009  . LIVER FUNCTION TESTS, ABNORMAL, HX OF 08/23/2009  . FATIGUE 01/16/2009  . PERSONAL HX COLONIC POLYPS 09/21/2008  . ADENOMATOUS COLONIC POLYP 09/20/2008  . ALLERGIC RHINITIS 04/28/2008  . RESTRICTIVE LUNG DISEASE 04/28/2008  . CRAMPS,LEG 09/28/2007  . OBESITY 04/01/2007  . VOCAL CORD DISORDER 04/01/2007  . REDUCTION MAMMOPLASTY, HX OF 04/01/2007  . RESTLESS LEG SYNDROME 02/02/2007  . Essential hypertension 02/02/2007  . MITRAL VALVE PROLAPSE 02/02/2007  . GERD 02/02/2007  . Irritable bowel syndrome 02/02/2007  .  BREAST CYST 02/02/2007   Past Medical History:  Diagnosis Date  . Allergic rhinitis   . Anxiety   . Asthma   . Colon polyps    adenomatous  . Diverticulosis   . Dysrhythmia 1999   Dr Johnsie Cancel 2003  . Elevated LFTs   . Fatty liver disease, nonalcoholic   . GERD (gastroesophageal reflux disease)   . Headache(784.0)    migraines with vision changes but no pain  . Heart murmur   . Hypertension   . IBS (irritable bowel syndrome)   . Obesity    . Pneumonia 2007  . Renal cell carcinoma ([place]) 2013  . Restless leg syndrome   . Sleep apnea    moderate; wears CPAP, uncertain about setting  . TIA (transient ischemic attack) 09/11/2014  . Ulcer     Family History  Problem Relation Age of Onset  . Heart disease Father 55  . Lung cancer Father   . Heart disease Brother   . Hypertension Other   . Colon cancer Cousin   . Allergies Daughter   . Allergies Brother   . Melanoma Daughter   . Liver disease Neg Hx   . Asthma Neg Hx     Past Surgical History:  Procedure Laterality Date  . ABDOMINAL HYSTERECTOMY  1979  . BREAST REDUCTION SURGERY  Jan 1991  . ELBOW SURGERY Left   . KIDNEY SURGERY Right October 2013    Tumor removed   . TONSILLECTOMY     Social History   Occupational History  . Retired Economist  .  Retired   Social History Main Topics  . Smoking status: Never Smoker  . Smokeless tobacco: Never Used  . Alcohol use 3.6 oz/week    6 Glasses of wine per week     Comment: 3-4 times a week  . Drug use: No  . Sexual activity: Yes    Birth control/ protection: Surgical     Garald Balding, MD   Note - This record has been created using [employer].  Chart creation errors have been sought, but may not always  have been located. Such creation errors do not reflect on  the standard of medical care.

## 2016-11-26 ENCOUNTER — Encounter: Payer: Self-pay | Admitting: Internal Medicine

## 2016-11-26 ENCOUNTER — Ambulatory Visit (INDEPENDENT_AMBULATORY_CARE_PROVIDER_SITE_OTHER): Payer: Medicare Other | Admitting: Internal Medicine

## 2016-11-26 VITALS — BP 124/70 | HR 76 | Ht 63.0 in | Wt 226.0 lb

## 2016-11-26 DIAGNOSIS — K5732 Diverticulitis of large intestine without perforation or abscess without bleeding: Secondary | ICD-10-CM | POA: Diagnosis not present

## 2016-11-26 NOTE — Progress Notes (Signed)
HISTORY OF PRESENT ILLNESS:  Jamie [name] is a 71 y.o. female with multiple medical problems who is followed in this office for GERD, fatty liver, IBS, constipation, and adenomatous colon polyps. She is sent today after recent hospitalization for diverticulitis. New problem for her. She contacted this office last week complaining of lower abdominal pain and fever. She was being treated for UTI. Offered work in appointment next day. However, saw her PCP that day. Was sent to hospital thereafter for significant abdominal pain. CT scan revealed uncomplicated sigmoid diverticulitis. She was treated with antibiotics. 3 day hospital stay. She was sent home on Augmentin 875 g twice daily (ten-day course) Since discharge she has developed difficulty better though still has some abdominal discomfort. No fevers. No new complaints. Multiple complaints related to her hospital stay  REVIEW OF SYSTEMS:  All non-GI ROS negative except for anxiety, arthritis, back pain, fever, headaches, Mark murmur, muscle aches, urinary frequency, urinary leakage  Past Medical History:  Diagnosis Date  . Allergic rhinitis   . Anxiety   . Arthritis    left foot  . Asthma   . Colon polyps    adenomatous  . Diverticulosis   . Dysrhythmia 1999   Dr Johnsie Cancel 2003  . Elevated LFTs   . Fatty liver disease, nonalcoholic   . GERD (gastroesophageal reflux disease)   . Headache(784.0)    migraines with vision changes but no pain  . Heart murmur   . Hypertension   . IBS (irritable bowel syndrome)   . Obesity   . Pneumonia 2007  . Renal cell carcinoma ([place]) 2013  . Restless leg syndrome   . Sleep apnea    moderate; wears CPAP, uncertain about setting  . TIA (transient ischemic attack) 09/11/2014  . Ulcer     Past Surgical History:  Procedure Laterality Date  . BREAST REDUCTION SURGERY  Jan 1991  . ELBOW SURGERY Left   . KIDNEY SURGERY Right October 2013    Tumor removed   . TONSILLECTOMY    . VAGINAL HYSTERECTOMY   1979   partial    Social History Karita D Villamar  reports that she has never smoked. She has never used smokeless tobacco. She reports that she drinks about 3.6 oz of alcohol per week . She reports that she does not use drugs.  family history includes Allergies in her brother and daughter; Colon cancer in her cousin; Heart disease in her brother; Heart disease (age of onset: 53) in her father; Hypertension in her other; Lung cancer in her father; Melanoma in her daughter.  Allergies  Allergen Reactions  . Buprenorphine Hcl Rash and Shortness Of Breath  . Morphine And Related Shortness Of Breath and Rash  . Morphine And Related Shortness Of Breath  . Aspirin Other (See Comments)    REACTION: ulcers  Can take 81 mg  . Avelox [Moxifloxacin Hcl In Nacl] Hives  . Diflucan [Fluconazole] Hives    Whelps and rash c skin peeling  . Diflucan [Fluconazole] Hives  . Latex Rash  . Moxifloxacin Palpitations    Tachycardia        PHYSICAL EXAMINATION: Vital signs: BP 124/70   Pulse 76   Ht 5' 3"  (1.6 m)   Wt 226 lb (102.5 kg)   BMI 40.03 kg/m   Constitutional: Pleasant, obese, generally well-appearing, no acute distress Psychiatric: alert and oriented x3, cooperative Eyes: extraocular movements intact, anicteric, conjunctiva pink Mouth: oral pharynx moist, no lesions Neck: supple no lymphadenopathy Cardiovascular: heart regular  rate and rhythm, no murmur Lungs: clear to auscultation bilaterally Abdomen: soft, obese, mild left lower quadrant discomfort to palpation, nondistended, no obvious ascites, no peritoneal signs, normal bowel sounds, no organomegaly Rectal: Deferred Extremities: no clubbing cyanosis or lower extremity edema bilaterally Skin: no lesions on visible extremities, except for multiple bruises from IV sticks  Neuro: No focal deficits. No asterixis.   ASSESSMENT:  #1. Acute sigmoid diverticulitis. Uncomplicated. Improving on antibiotics #2. GERD. Stable on  PPI #3. History of constipation predominant IBS #4. History of adenomatous colon polyps. Due for surveillance colonoscopy this summer   PLAN:  #1. Continue Augmentin 875 twice daily until completed (ten-day course) #2. Advance diet as tolerated. Eventually high-fiber diet #3. Continue PPI #4. Keep plans for scheduled colonoscopy in July #5. Contact the office in the interim for any questions or problems  25 minutes was spent face-to-face with the patient. Greater than 50% a time use for counseling regarding her issues with diverticulitis

## 2016-11-26 NOTE — Patient Instructions (Signed)
Please follow up as needed 

## 2016-12-16 DIAGNOSIS — G4733 Obstructive sleep apnea (adult) (pediatric): Secondary | ICD-10-CM | POA: Diagnosis not present

## 2016-12-16 DIAGNOSIS — I10 Essential (primary) hypertension: Secondary | ICD-10-CM | POA: Diagnosis not present

## 2016-12-16 DIAGNOSIS — K5792 Diverticulitis of intestine, part unspecified, without perforation or abscess without bleeding: Secondary | ICD-10-CM | POA: Diagnosis not present

## 2016-12-16 DIAGNOSIS — N390 Urinary tract infection, site not specified: Secondary | ICD-10-CM | POA: Diagnosis not present

## 2016-12-18 ENCOUNTER — Encounter: Payer: Self-pay | Admitting: Nurse Practitioner

## 2016-12-19 ENCOUNTER — Encounter: Payer: Self-pay | Admitting: Nurse Practitioner

## 2016-12-19 ENCOUNTER — Ambulatory Visit (INDEPENDENT_AMBULATORY_CARE_PROVIDER_SITE_OTHER): Payer: Medicare Other | Admitting: Nurse Practitioner

## 2016-12-19 VITALS — BP 105/65 | HR 62 | Wt 221.8 lb

## 2016-12-19 DIAGNOSIS — G4733 Obstructive sleep apnea (adult) (pediatric): Secondary | ICD-10-CM

## 2016-12-19 DIAGNOSIS — I10 Essential (primary) hypertension: Secondary | ICD-10-CM

## 2016-12-19 DIAGNOSIS — Z9989 Dependence on other enabling machines and devices: Secondary | ICD-10-CM | POA: Diagnosis not present

## 2016-12-19 DIAGNOSIS — G43009 Migraine without aura, not intractable, without status migrainosus: Secondary | ICD-10-CM

## 2016-12-19 DIAGNOSIS — G459 Transient cerebral ischemic attack, unspecified: Secondary | ICD-10-CM

## 2016-12-19 MED ORDER — ONDANSETRON HCL 4 MG PO TABS: 4.0000 mg | ORAL_TABLET | Freq: Three times a day (TID) | ORAL | 0 refills | Status: DC | PRN

## 2016-12-19 NOTE — Progress Notes (Signed)
I have read the note, and I agree with the clinical assessment and plan.  WILLIS,CHARLES KEITH   

## 2016-12-19 NOTE — Progress Notes (Signed)
[facility]  PATIENT: Jamie [name] DOB: 1946-02-29   REASON FOR VISIT: follow up for TIAs worsening headaches/ migraine , OSA with CPAP  HISTORY FROM:patient and husband    HISTORY OF PRESENT ILLNESS:HISTORY (CD)Is and is here today to be evaluated for a headache, and she reports that she first had migrainous headaches when she resumed birth control pills over 40 years ago. The headaches were very debilitating at the time but there were infrequent through the 1970s. Once she discontinued hormonal contraception her headaches became as infrequent as once a year. In the early 1980s the couple lived in [place] and Mrs. Siller recalls that the bright light was a trigger for headaches -there was a strong photophobic component, and she made many trips to the local hospital for Demerol shots. She underwent a self hypnosis treatment and this worked well, controlled the migraine for years. On 9-20 02-2013 the patient woke up with a severe explosive kind of pain that seemed to arise from the right eye. The pain was centered retro-orbital and radiated backwards into the center of the head. The patient tried aspirin and Motrin with no benefit at the time as my colleague Dr. Janann Colonel D.O. did document.  This was different from her typical migraines. She was extremely nauseated but she could not vomit and developed dry heaves. The headache broker out of sound sleep at about about 3 AM. She had no tearing of the eye ( that she could remember), the nose was not runny and her face was not flushe.She was asleep with her CPAP.  The patient had an eye exam in 2013 which was reportedly normal at that time and her headaches were evaluated with a brain MRI which was also reviewed and found unremarkable. Not had a number of the severe headache spell since. On 09-12-14 the patient again developed a headache this time of a new quality. She was watching TV at about 11 PM she noticed that the right vision  was fluctuating she states that her vision was disturbed by changing in light and dark sensation, that she describedas if it checker board was placed in front of her eyes. By 11.45 , she tried to answer an e mail, couldn't decipher the message, non-sensical to her. She switched and tried to read aloud, she couldn't , her speech output was not correlated to the text she read. Jibberish, while her normal speech was unaffected. On the left side this time by migraines have always attack the right side of her head. This was unusual as well. The entire episode lasted less than one hour. The emergency room obtained a CT head , non contrast, read as normal and reviewed here in imaging file. Her BP was very high.  She sleeps with a CPAP, and likes it very much, she had nocturia before being diagnosed with OSA.  Her pneumonia related hospitalization in 2007 was the cause for the test by [facility], as her RN had witnessed her sleep apnea. Dr .Elsworth Soho follows her, Dr Constance Holster is her ENT.  Interval history from 03/27/2017CD. Mrs. Brafford is doing very well she has had one of the severe headaches she used to describe in the past, -onset at about 5:30 after working on the computer. She developed a severe right-sided headache. Afraid this could be another TIA I  have recommended her to go to the emergency room, she was given morphine for the headaches which then caused another reaction and side effects. She did not have any speech  arrest with this one. The emergency room did not find anything abnormal, she underwent a CT scan of the brain, which was normal, MRI brain  was normal. I'm also able today to just review a download of her CPAP machine which has been followed by Dr. Shelia Media but not in the last 3 years. She has 100% compliance for days 100% compliance for over 4 hours of daily use, average user time is 9 hours 7 minutes set pressure is 12 cm water no EPR is used residual AHI is 0.8. Excellent result. Her obesity was the same.  No weight loss.  She was given a prescrition of Fioricet and ondansetron also known as Zofran. She's not supposed to take the medication before the headache component has started. UPDATE 03/14/2018CM Ms. Frankenfield, 71 year old female returns for follow-up.She has not had further stroke or TIA episodes. She denies any recent falls  She had a couple episodes of blurred vision without headache. She has not had any speech arrest, weakness on one side or the other etc. She continues to use her CPAP every night, however she took that she about 2 weeks ago and we are unable to get a download. She remains on enteric-coated aspirin. She continues to get little exercise due to her chronic vertigo. Her transcranial Dopplers showed minimal PFO not likely to be clinically significant. She has been evaluated at [facility] for her chronic vertigo. Her husband reports several instances since last seen when she fell asleep at the computer without her CPAP and stopped breathing. Once he got oxygen to her she woke up and was fine.  She returns for reevaluation. UPDATE 06/14/2018CM Ms. Riche, 71 year old female returns for follow-up she has a history of TIA and is currently on aspirin for secondary stroke prevention. Blood pressure in the office today 105/65. She continues to take at home and generally in the 326 systolic range. Patient was seen in the emergency room on 11/07/2016 for headache in the left unilateral region the pain did not radiate no nausea and vomiting. She had some visual blurring and has a history of similar headaches She was given Reglan and Benadryl. She is having maybe 2 headaches not enough for a preventive medication. She says prescription strength Motrin takes the headache away worst part is the nausea for which she has nothing to take. She denies any speech arrest weakness on one side or the other. She continues to be compliant with her CPAP download was obtained today. Greater than 4 hours use 97% or  29 days. Average usage not hours 13 minutes at 12 cm pressure. AHI 0.8 She returns for reevaluation  REVIEW OF SYSTEMS: Full 14 system review of systems performed and notable only for those listed, all others are neg:  Constitutional: neg  Cardiovascular: neg Ear/Nose/Throat: Ringing in the ears Skin: neg Eyes: Blurred vision light sensitivity with headaches Respiratory: neg Gastroitestinal: neg  Hematology/Lymphatic: neg  Endocrine: neg Musculoskeletal:neg Allergy/Immunology: neg Neurological: Chronic vertigo followed by [facility] Psychiatric: neg Sleep : Obstructive sleep apnea with CPAP ALLERGIES: Allergies  Allergen Reactions  . Buprenorphine Hcl Rash and Shortness Of Breath  . Morphine And Related Shortness Of Breath and Rash  . Morphine And Related Shortness Of Breath  . Aspirin Other (See Comments)    REACTION: ulcers  Can take 81 mg  . Avelox [Moxifloxacin Hcl In Nacl] Hives  . Diflucan [Fluconazole] Hives    Whelps and rash c skin peeling  . Diflucan [Fluconazole] Hives  . Latex Rash  .  Moxifloxacin Palpitations    Tachycardia     HOME MEDICATIONS: Outpatient Medications Prior to Visit  Medication Sig Dispense Refill  . aspirin EC 81 MG tablet Take 1 tablet (81 mg total) by mouth daily. 90 tablet 3  . BIOTIN PO Take 1 capsule by mouth at bedtime.     . clidinium-chlordiazePOXIDE (LIBRAX) 5-2.5 MG capsule Take 1 capsule by mouth 2 (two) times daily. 180 capsule 3  . clonazePAM (KLONOPIN) 1 MG tablet Take 1 mg by mouth at bedtime.     . diphenhydrAMINE (BENADRYL) 25 MG tablet Take 25 mg by mouth every 8 (eight) hours as needed for allergies.     Marland Kitchen esomeprazole (NEXIUM) 40 MG capsule Take 1 capsule (40 mg total) by mouth 2 (two) times daily. 180 capsule 3  . fluticasone (FLONASE) 50 MCG/ACT nasal spray Place 2 sprays into the nose daily as needed for rhinitis or allergies.    Marland Kitchen HYDROcodone-acetaminophen (NORCO) 5-325 MG tablet Take 1 tablet by mouth every 6  (six) hours as needed for moderate pain. 12 tablet 0  . linaclotide (LINZESS) 145 MCG CAPS capsule Take 1 capsule (145 mcg total) by mouth daily. 90 capsule 3  . metaxalone (SKELAXIN) 800 MG tablet Take 400 mg by mouth at bedtime.     . metoCLOPramide (REGLAN) 10 MG tablet Take 1 tablet (10 mg total) by mouth every 8 (eight) hours as needed for nausea (nausea/headache). 6 tablet 0  . metoprolol succinate (TOPROL-XL) 100 MG 24 hr tablet Take 100 mg by mouth every evening. Take with or immediately following a meal.    . MILK THISTLE PO Take 1 capsule by mouth at bedtime.     . Multiple Vitamin (MULTIVITAMIN WITH MINERALS) TABS Take 1 tablet by mouth every evening.     . olmesartan (BENICAR) 40 MG tablet Take 40 mg by mouth every evening.    Marland Kitchen spironolactone (ALDACTONE) 50 MG tablet Take 50 mg by mouth every morning.    . valsartan-hydrochlorothiazide (DIOVAN-HCT) 320-12.5 MG tablet     . amoxicillin-clavulanate (AUGMENTIN) 875-125 MG tablet Take 1 tablet by mouth every 12 (twelve) hours. (Patient not taking: Reported on 12/19/2016) 20 tablet 0  . cephALEXin (KEFLEX) 500 MG capsule TK 1 C PO BID FOR 7 DAYS  0   No facility-administered medications prior to visit.     PAST MEDICAL HISTORY: Past Medical History:  Diagnosis Date  . Allergic rhinitis   . Anxiety   . Arthritis    left foot  . Asthma   . Colon polyps    adenomatous  . Diverticulosis   . Dysrhythmia 1999   Dr Johnsie Cancel 2003  . Elevated LFTs   . Fatty liver disease, nonalcoholic   . GERD (gastroesophageal reflux disease)   . Headache(784.0)    migraines with vision changes but no pain  . Heart murmur   . Hypertension   . IBS (irritable bowel syndrome)   . Obesity   . Pneumonia 2007  . Renal cell carcinoma ([place]) 2013  . Restless leg syndrome   . Sleep apnea    moderate; wears CPAP, uncertain about setting  . Stroke ([place])   . TIA (transient ischemic attack) 09/11/2014  . Ulcer     PAST SURGICAL HISTORY: Past Surgical  History:  Procedure Laterality Date  . BREAST REDUCTION SURGERY  Jan 1991  . ELBOW SURGERY Left   . KIDNEY SURGERY Right October 2013    Tumor removed   . TONSILLECTOMY    .  VAGINAL HYSTERECTOMY  1979   partial    FAMILY HISTORY: Family History  Problem Relation Age of Onset  . Heart disease Father 59  . Lung cancer Father        smoker  . Heart disease Brother   . Hypertension Other   . Colon cancer Cousin   . Allergies Daughter   . Allergies Brother   . Melanoma Daughter   . Liver disease Neg Hx   . Asthma Neg Hx     SOCIAL HISTORY: Social History   Social History  . Marital status: Married    Spouse name: Sallyanne Kuster  . Number of children: 2  . Years of education: 15   Occupational History  . Retired Retail buyer   Social History Main Topics  . Smoking status: Never Smoker  . Smokeless tobacco: Never Used  . Alcohol use 3.6 oz/week    6 Glasses of wine per week     Comment: 3-4 times a week  . Drug use: No  . Sexual activity: Yes    Birth control/ protection: Surgical   Other Topics Concern  . Not on file   Social History Narrative   ** Merged History Encounter **       Exercise 3-4 times a week.   Married Sallyanne Kuster) and lives with her spouse.   Retired Personal assistant.   Education- college   Patient is right handed.   Patient drinks tea a lot and water.     PHYSICAL EXAM  Vitals:   12/19/16 1413  BP: 105/65  Pulse: 62  Weight: 221 lb 12.8 oz (100.6 kg)   Body mass index is 39.29 kg/m.  Generalized: Well developed,Obese female in no acute distress  Head: normocephalic and atraumatic,. Oropharynx benign  Neck: Supple, no carotid bruits  Cardiac: Regular rate rhythm, no murmur  Musculoskeletal: No deformity   Neurological examination   Mentation: Alert oriented to time, place, history taking. Attention span and concentration appropriate. Recent and remote memory intact.  Follows all commands speech and language fluent.     Cranial nerve II-XII: .Pupils were equal round reactive to light extraocular movements were full, visual field were full on confrontational test. Facial sensation and strength were normal. hearing was intact to finger rubbing bilaterally. Uvula tongue midline. head turning and shoulder shrug were normal and symmetric.Tongue protrusion into cheek strength was normal. Motor: normal bulk and tone, full strength in the BUE, BLE, fine finger movements normal, no pronator drift.  Sensory: normal and symmetric to light touch, pinprick, and  Vibration, in the upper and lower extremities Coordination: finger-nose-finger, heel-to-shin bilaterally, no dysmetria Reflexes: Symmetric upper and lower plantar responses were flexor bilaterally. Gait and Station: Rising up from seated position without assistance, normal stance,  moderate stride, good arm swing, smooth turning, able to perform tiptoe, and heel walking without difficulty. Tandem gait is steady.  DIAGNOSTIC DATA (LABS, IMAGING, TESTING) - I reviewed patient records, labs, notes, testing and imaging myself where available.  Lab Results  Component Value Date   WBC 5.3 11/22/2016   HGB 10.7 (L) 11/22/2016   HCT 31.9 (L) 11/22/2016   MCV 94.4 11/22/2016   PLT 168 11/22/2016      Component Value Date/Time   NA 138 11/22/2016 0457   K 3.9 11/22/2016 0457   CL 106 11/22/2016 0457   CO2 23 11/22/2016 0457   GLUCOSE 93 11/22/2016 0457   BUN 17 11/22/2016 0457   CREATININE 1.16 (  H) 11/22/2016 0457   CALCIUM 8.9 11/22/2016 0457   PROT 6.0 (L) 11/22/2016 0457   ALBUMIN 3.3 (L) 11/22/2016 0457   AST 40 11/22/2016 0457   ALT 65 (H) 11/22/2016 0457   ALKPHOS 70 11/22/2016 0457   BILITOT 0.7 11/22/2016 0457   GFRNONAA 47 (L) 11/22/2016 0457   GFRAA 55 (L) 11/22/2016 0457      ASSESSMENT AND PLAN  71 y.o. year old female  has a past medical history of Obstructive sleep apnea with CPAP, chronic vertigo she has participated in vestibular  rehabilitation without success. She is seeing a specialist at [facility] but this was no help. Headaches are in good control However she is having about 2 month with visual symptoms. This would not warrant a preventative medication She is wanting something for nausea. CPAP Compliance Greater than 4 hours use 97% or 29 days. Average usage not hours 13 minutes at 12 cm pressure. AHI 0.8The patient is a current patient of Dr. Brett Fairy  who is out of the office today . This note is sent to the work in doctor.     PLAN: Continue ASA for secondary stroke prevention B/P well controlled 105/65 Continue CPAP excellent compliance Low intensity exercise for overall health Try Zofran for nausea related to headache when necessary Given a list of food triggers eliminate one at a time (that can cause headache) Keep a record of your headaches and bring to next appointment Follow-up in 4 months with Dr. Brett Fairy I spent 42mn in total face to face time with the patient more than 50% of which was spent counseling and coordination of care, reviewing test results reviewing medications and discussing and reviewing the diagnosis of headache and migraine triggers importance of keeping a record. Also discussed secondary stroke prevention and congratulated on her CPAP compliance   NDennie Bible [facility] [address] [place]  [phone]

## 2016-12-19 NOTE — Patient Instructions (Addendum)
Continue ASA for secondary stroke prevention B/P well controlled 105/65 Continue CPAP excellent compliance Low intensity exercise for overall health Try Zofran for nausea related to headache when necessary Given a list of food triggers Keep a record of your headaches and bring to next appointment Follow-up in 4 months with Dr. Brett Fairy

## 2016-12-30 ENCOUNTER — Ambulatory Visit (AMBULATORY_SURGERY_CENTER): Payer: Self-pay | Admitting: *Deleted

## 2016-12-30 VITALS — Ht 63.0 in | Wt 227.4 lb

## 2016-12-30 DIAGNOSIS — Z86010 Personal history of colonic polyps: Secondary | ICD-10-CM

## 2016-12-30 MED ORDER — NA SULFATE-K SULFATE-MG SULF 17.5-3.13-1.6 GM/177ML PO SOLN: 1.0000 | Freq: Once | ORAL | 0 refills | Status: AC

## 2016-12-30 NOTE — Progress Notes (Signed)
Denies allergies to eggs or soy products. Denies complications with sedation or anesthesia. Denies O2 use. Denies use of diet or weight loss medications.  Emmi instructions given for colonoscopy.  

## 2016-12-31 DIAGNOSIS — I10 Essential (primary) hypertension: Secondary | ICD-10-CM | POA: Diagnosis not present

## 2016-12-31 DIAGNOSIS — J309 Allergic rhinitis, unspecified: Secondary | ICD-10-CM | POA: Diagnosis not present

## 2017-01-13 ENCOUNTER — Encounter: Payer: Self-pay | Admitting: Internal Medicine

## 2017-01-13 ENCOUNTER — Ambulatory Visit (AMBULATORY_SURGERY_CENTER): Payer: Medicare Other | Admitting: Internal Medicine

## 2017-01-13 VITALS — BP 107/63 | HR 72 | Temp 98.2°F | Resp 12 | Ht 63.0 in | Wt 227.0 lb

## 2017-01-13 DIAGNOSIS — D122 Benign neoplasm of ascending colon: Secondary | ICD-10-CM

## 2017-01-13 DIAGNOSIS — D124 Benign neoplasm of descending colon: Secondary | ICD-10-CM | POA: Diagnosis not present

## 2017-01-13 DIAGNOSIS — Z86010 Personal history of colonic polyps: Secondary | ICD-10-CM

## 2017-01-13 DIAGNOSIS — D123 Benign neoplasm of transverse colon: Secondary | ICD-10-CM

## 2017-01-13 DIAGNOSIS — K5732 Diverticulitis of large intestine without perforation or abscess without bleeding: Secondary | ICD-10-CM | POA: Diagnosis not present

## 2017-01-13 MED ORDER — SODIUM CHLORIDE 0.9 % IV SOLN: 500.0000 mL | INTRAVENOUS | Status: DC

## 2017-01-13 NOTE — Progress Notes (Signed)
Pt's states no medical or surgical changes since previsit or office visit. 

## 2017-01-13 NOTE — Progress Notes (Signed)
To PACU VSS, Report to RN

## 2017-01-13 NOTE — Progress Notes (Signed)
Called to room to assist during endoscopic procedure.  Patient ID and intended procedure confirmed with present staff. Received instructions for my participation in the procedure from the performing physician.  

## 2017-01-13 NOTE — Patient Instructions (Signed)
YOU HAD AN ENDOSCOPIC PROCEDURE TODAY AT [facility]:   Refer to the procedure report that was given to you for any specific questions about what was found during the examination.  If the procedure report does not answer your questions, please call your gastroenterologist to clarify.  If you requested that your care partner not be given the details of your procedure findings, then the procedure report has been included in a sealed envelope for you to review at your convenience later.  YOU SHOULD EXPECT: Some feelings of bloating in the abdomen. Passage of more gas than usual.  Walking can help get rid of the air that was put into your GI tract during the procedure and reduce the bloating. If you had a lower endoscopy (such as a colonoscopy or flexible sigmoidoscopy) you may notice spotting of blood in your stool or on the toilet paper. If you underwent a bowel prep for your procedure, you may not have a normal bowel movement for a few days.  Please Note:  You might notice some irritation and congestion in your nose or some drainage.  This is from the oxygen used during your procedure.  There is no need for concern and it should clear up in a day or so.  SYMPTOMS TO REPORT IMMEDIATELY:   Following lower endoscopy (colonoscopy or flexible sigmoidoscopy):  Excessive amounts of blood in the stool  Significant tenderness or worsening of abdominal pains  Swelling of the abdomen that is new, acute  Fever of 100F or higher  For urgent or emergent issues, a gastroenterologist can be reached at any hour by calling [phone].  DIET:  We do recommend a small meal at first, but then you may proceed to your regular diet.  Drink plenty of fluids but you should avoid alcoholic beverages for 24 hours.  ACTIVITY:  You should plan to take it easy for the rest of today and you should NOT DRIVE or use heavy machinery until tomorrow (because of the sedation medicines used during the test).     FOLLOW UP: Our staff will call the number listed on your records the next business day following your procedure to check on you and address any questions or concerns that you may have regarding the information given to you following your procedure. If we do not reach you, we will leave a message.  However, if you are feeling well and you are not experiencing any problems, there is no need to return our call.  We will assume that you have returned to your regular daily activities without incident.  If any biopsies were taken you will be contacted by phone or by letter within the next 1-3 weeks.  Please call us at [phone] if you have not heard about the biopsies in 3 weeks.   SIGNATURES/CONFIDENTIALITY: You and/or your care partner have signed paperwork which will be entered into your electronic medical record.  These signatures attest to the fact that that the information above on your After Visit Summary has been reviewed and is understood.  Full responsibility of the confidentiality of this discharge information lies with you and/or your care-partner.  Please read over handouts about polyps, diverticulosis, hemorrhoids and high fiber diets  Continue your normal medications  Await pathology- next colonoscopy 3 years

## 2017-01-13 NOTE — Op Note (Signed)
[facility] Patient Name: Jamie [name] Procedure Date: 01/13/2017 11:16 AM MRN: [id number] Endoscopist: Docia Chuck. Henrene Pastor , MD Age: 71 Referring MD:  Date of Birth: 1945/12/06 Gender: Female Account #: [id number] Procedure:                Colonoscopy, with cold snare polypectomy x 8 Indications:              High risk colon cancer surveillance: Personal                            history of non-advanced adenoma. Previous                            examinations 1999, 2004, 2008, and 2013 Medicines:                Monitored Anesthesia Care Procedure:                Pre-Anesthesia Assessment:                           - Prior to the procedure, a History and Physical                            was performed, and patient medications and                            allergies were reviewed. The patient's tolerance of                            previous anesthesia was also reviewed. The risks                            and benefits of the procedure and the sedation                            options and risks were discussed with the patient.                            All questions were answered, and informed consent                            was obtained. Prior Anticoagulants: The patient has                            taken no previous anticoagulant or antiplatelet                            agents. ASA Grade Assessment: II - A patient with                            mild systemic disease. After reviewing the risks                            and benefits, the patient was deemed in  satisfactory condition to undergo the procedure.                           After obtaining informed consent, the colonoscope                            was passed under direct vision. Throughout the                            procedure, the patient's blood pressure, pulse, and                            oxygen saturations were monitored continuously. The   Colonoscope was introduced through the anus and                            advanced to the the cecum, identified by                            appendiceal orifice and ileocecal valve. The                            ileocecal valve, appendiceal orifice, and rectum                            were photographed. The quality of the bowel                            preparation was excellent. The colonoscopy was                            performed without difficulty. The patient tolerated                            the procedure well. The bowel preparation used was                            SUPREP. Scope In: 11:22:42 AM Scope Out: 11:47:22 AM Scope Withdrawal Time: 0 hours 20 minutes 49 seconds  Total Procedure Duration: 0 hours 24 minutes 40 seconds  Findings:                 Eight polyps were found in the descending colon,                            transverse colon and ascending colon. The polyps                            were 2 to 4 mm in size. These polyps were removed                            with a cold snare. Resection and retrieval were                            complete.  Multiple diverticula were found in the sigmoid                            colon.                           Internal hemorrhoids were found during                            retroflexion. The hemorrhoids were moderate.                           The exam was otherwise without abnormality on                            direct and retroflexion views. Complications:            No immediate complications. Estimated blood loss:                            None. Estimated Blood Loss:     Estimated blood loss: none. Impression:               - Eight 2 to 4 mm polyps in the descending colon,                            in the transverse colon and in the ascending colon,                            removed with a cold snare. Resected and retrieved.                           - Diverticulosis in the  sigmoid colon.                           - Internal hemorrhoids.                           - The examination was otherwise normal on direct                            and retroflexion views. Recommendation:           - Repeat colonoscopy in 3 years for surveillance.                           - Patient has a contact number available for                            emergencies. The signs and symptoms of potential                            delayed complications were discussed with the                            patient. Return to normal activities tomorrow.  Written discharge instructions were provided to the                            patient.                           - Resume previous diet.                           - Continue present medications.                           - Await pathology results. Docia Chuck. Henrene Pastor, MD 01/13/2017 11:53:51 AM This report has been signed electronically.

## 2017-01-14 ENCOUNTER — Telehealth: Payer: Self-pay | Admitting: *Deleted

## 2017-01-14 NOTE — Telephone Encounter (Signed)
  Follow up Call-  Call back number 01/13/2017  Post procedure Call Back phone  # [phone]  Permission to leave phone message Yes  comments pt states will not answer at that hour  Some recent data might be hidden     Patient questions:  Do you have a fever, pain , or abdominal swelling? No. Pain Score  0 *  Have you tolerated food without any problems? Yes.    Have you been able to return to your normal activities? Yes.    Do you have any questions about your discharge instructions: Diet   No. Medications  No. Follow up visit  No.  Do you have questions or concerns about your Care? No.  Actions: * If pain score is 4 or above: No action needed, pain <4.

## 2017-01-16 ENCOUNTER — Encounter: Payer: Self-pay | Admitting: Internal Medicine

## 2017-01-21 ENCOUNTER — Telehealth: Payer: Self-pay | Admitting: Internal Medicine

## 2017-01-22 NOTE — Telephone Encounter (Signed)
[name] (patient's husband) states patient recived another rx for Nexium in addition to the rx that is on file. According to [name] this rx was sent on 01/14/17 by Dr. Henrene Pastor. I informed [name] according to our records we have not sent a recent rx of Nexium. The last rx was sent on 11/08/16. [name] states he is not sure what happened but was billed 19 dollars since he was sent another rx for Nexium in the mail but his wife has plenty of Nexium on file already. Informed patient that I will call [facility] and look into this for them.

## 2017-01-22 NOTE — Telephone Encounter (Signed)
According to [facility] there was a manuel fax from our office refilling Nexium once daily dosing on 01/01/17.   Called [name] back and apologized that someone from our office manually faxed a prescription for Nexium and did not document it in the computer and also sent it in for the wrong dosing. I apologized again that they had to pay 53 dollars also for a prescription they did not need refilled.  Patient thanked me for researching this and hung up.

## 2017-01-22 NOTE — Telephone Encounter (Signed)
Left a message for patient to return my call. 

## 2017-02-26 DIAGNOSIS — N952 Postmenopausal atrophic vaginitis: Secondary | ICD-10-CM | POA: Diagnosis not present

## 2017-02-26 DIAGNOSIS — R8290 Unspecified abnormal findings in urine: Secondary | ICD-10-CM | POA: Diagnosis not present

## 2017-02-26 DIAGNOSIS — N3642 Intrinsic sphincter deficiency (ISD): Secondary | ICD-10-CM | POA: Diagnosis not present

## 2017-02-26 DIAGNOSIS — N9419 Other specified dyspareunia: Secondary | ICD-10-CM | POA: Diagnosis not present

## 2017-04-14 IMAGING — CT CT HEAD W/O CM
3 of 5 series · 14 of 47 positions shown, 16 images · non-contrast
Comparison: None.

CLINICAL DATA: Fall. Unresponsive. Ethanol use earlier this
evening/this morning.

EXAM:
CT HEAD WITHOUT CONTRAST
CT CERVICAL SPINE WITHOUT CONTRAST
TECHNIQUE: Multidetector CT imaging of the head and cervical spine was
performed following the standard protocol without intravenous
contrast. Multiplanar CT image reconstructions of the cervical spine
were also generated.

[Series 302: soft tissue, idose (2) · axial · 0.39mm/px · z∈[+76,+244]mm · 8 of 100 slices shown, 10 images]
[im 8/100  brain]
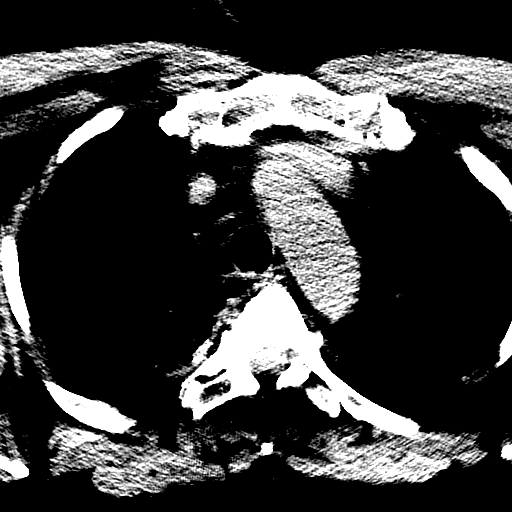
[im 8/100  bone]
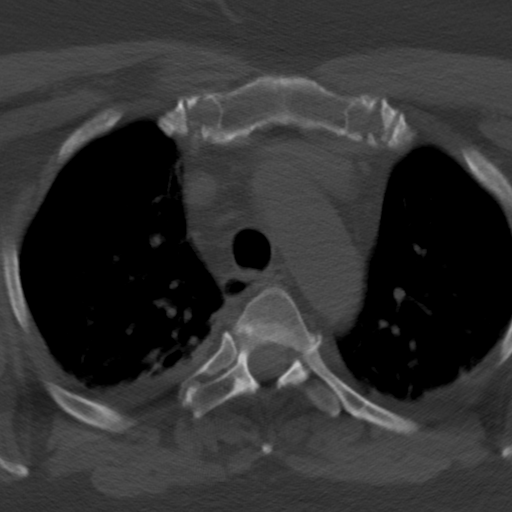
[im 22/100  brain]
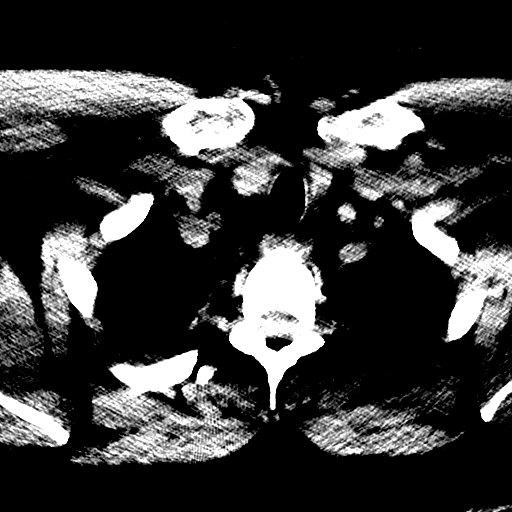
[im 36/100  brain]
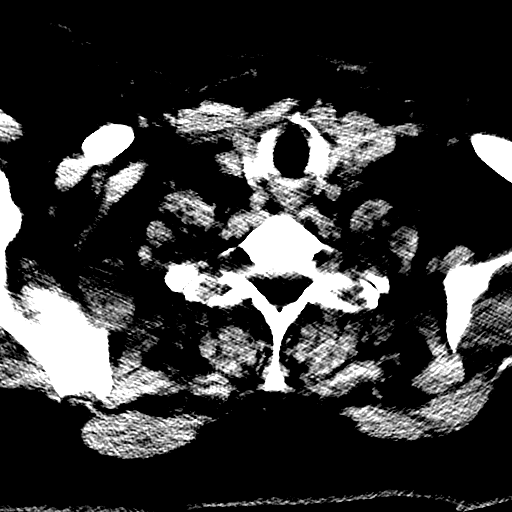
[im 43/100  brain]
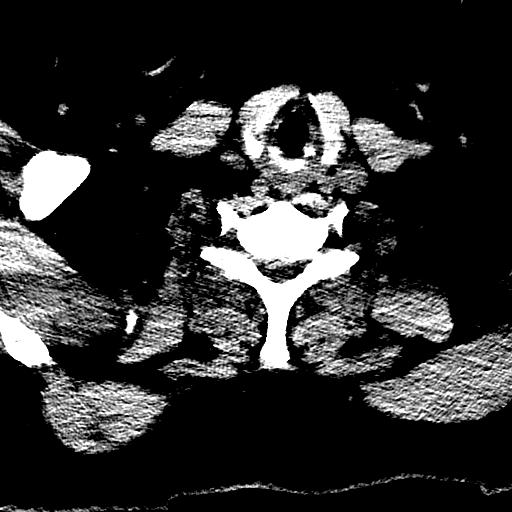
[im 57/100  brain]
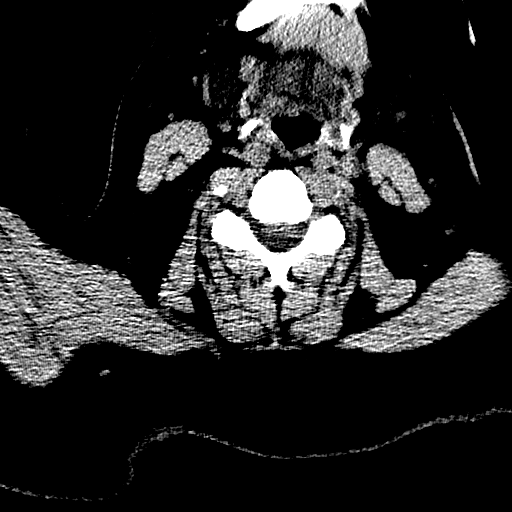
[im 57/100  bone]
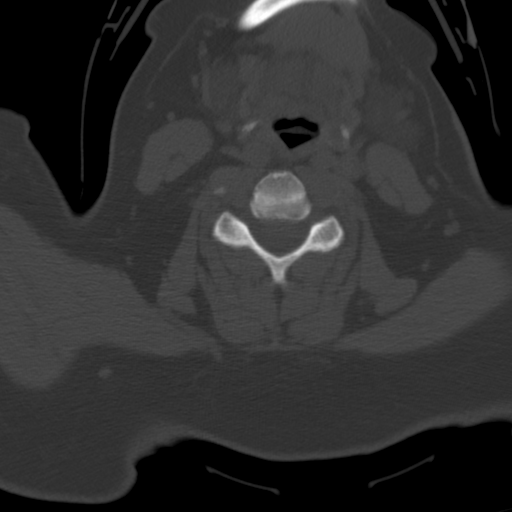
[im 64/100  brain]
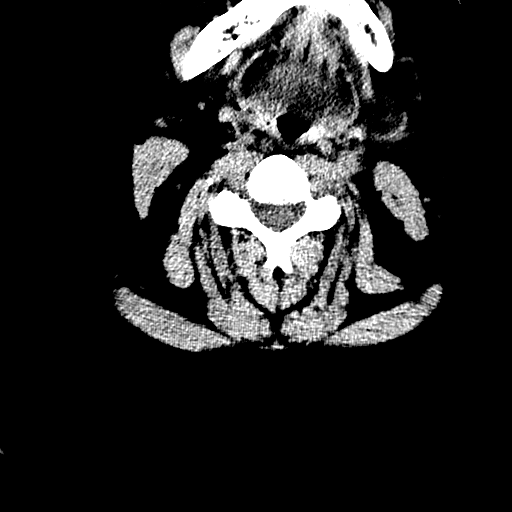
[im 78/100  brain]
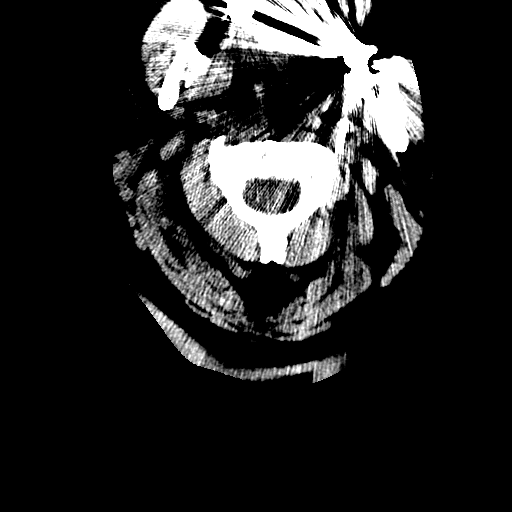
[im 92/100  brain]
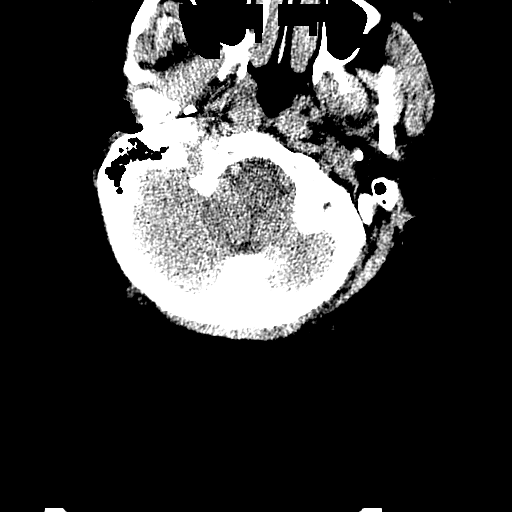

[Series 304: coronal, idose (2) · coronal · 0.34mm/px · 3 of 100 slices shown]
[im 34/100  brain]
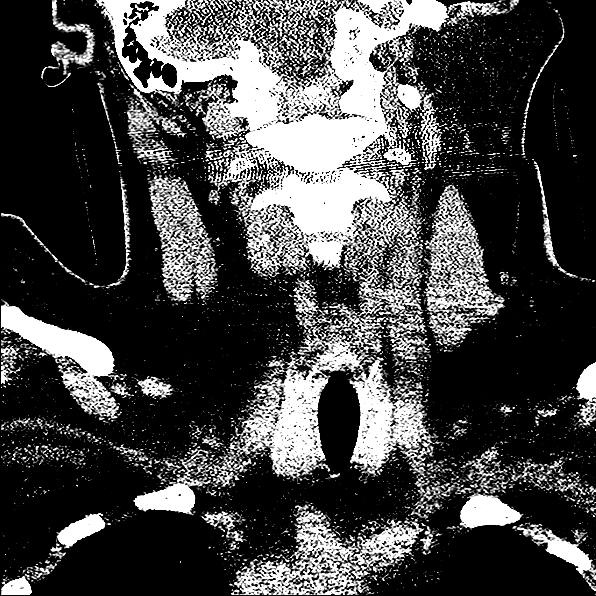
[im 45/100  brain]
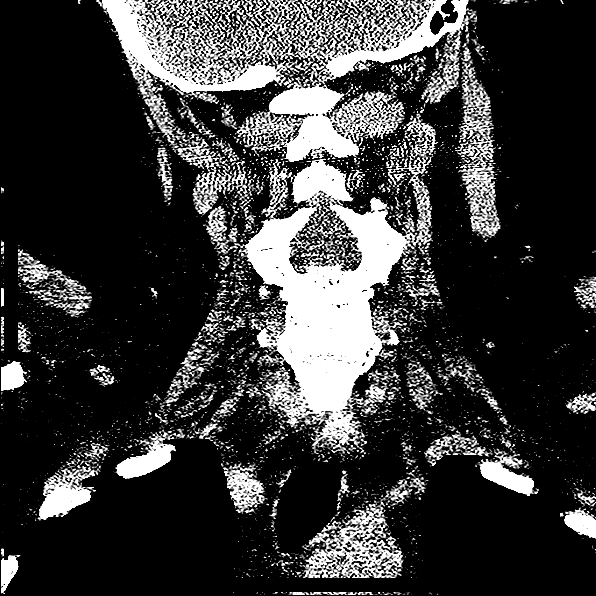
[im 56/100  brain]
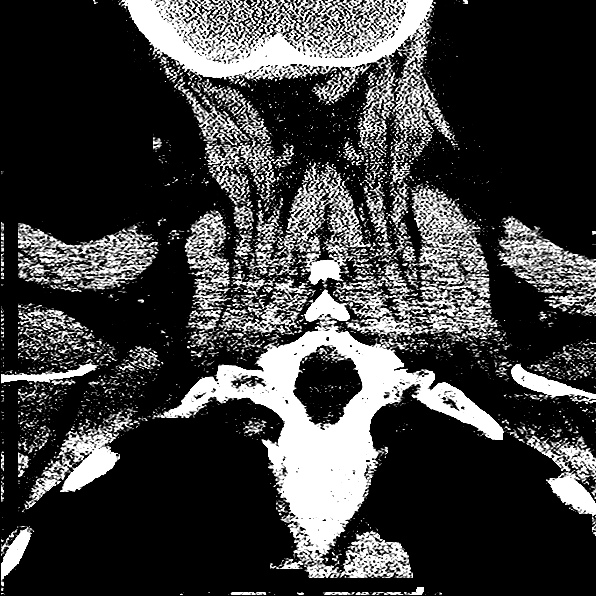

[Series 305: sagittal, idose (2) · sagittal · 0.34mm/px · 3 of 100 slices shown]
[im 34/100  brain]
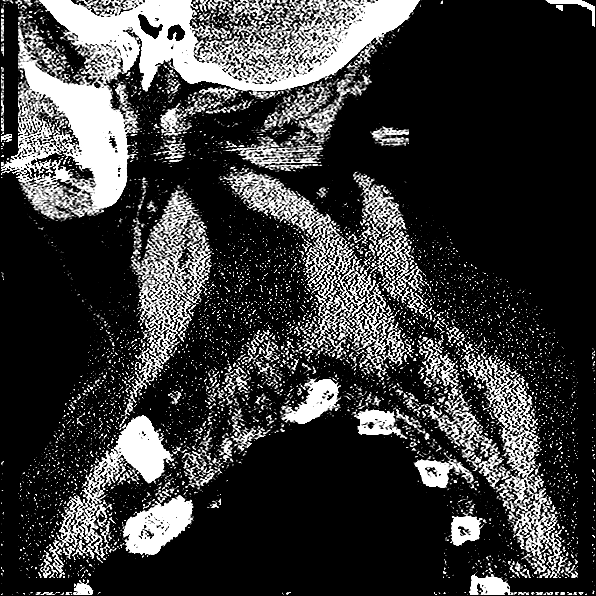
[im 50/100  brain]
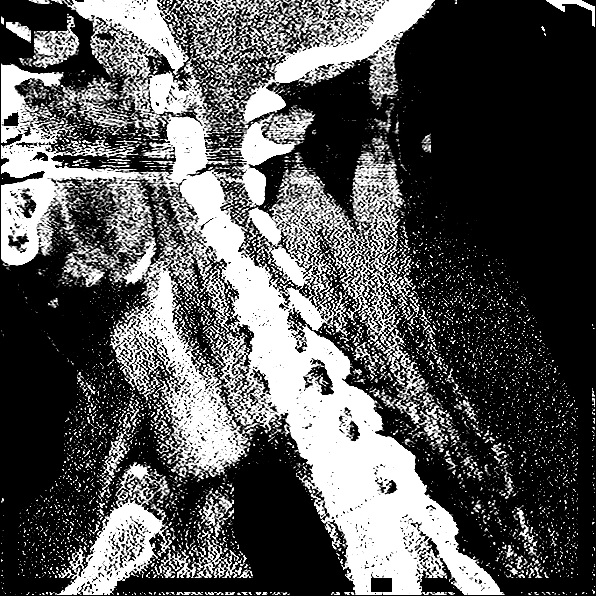
[im 67/100  brain]
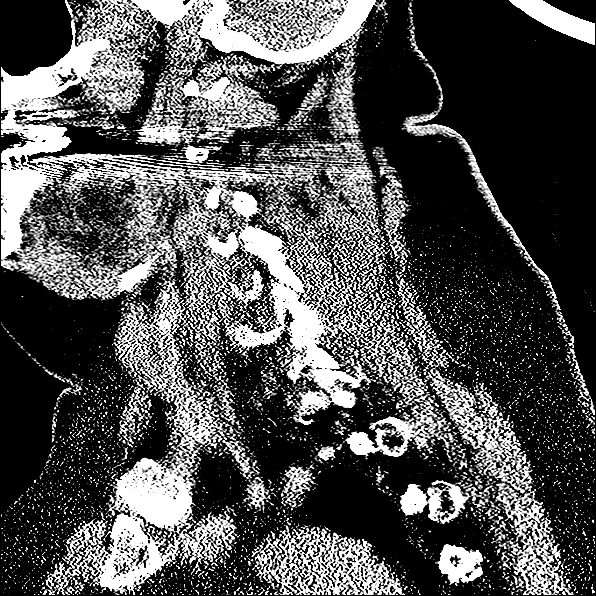

[14 of 47 positions shown; findings below may reference images not displayed]

FINDINGS: CT HEAD FINDINGS

Sinuses/Soft tissues: No significant soft tissue swelling. Clear
paranasal sinuses and mastoid air cells. No skull fracture.

Intracranial: Cerebral atrophy. No mass lesion, hemorrhage,
hydrocephalus, acute infarct, intra-axial, or extra-axial fluid
collection.

CT CERVICAL SPINE FINDINGS

Spinal visualization through the bottom of T3. Prevertebral soft
tissues are within normal limits. From C6 inferiorly are
suboptimally evaluated secondary to patient body habitus and
overlying soft tissues. No apical pneumothorax. Cervical
spondylosis, with areas of central canal and bilateral neural
foraminal narrowing at C5-6, C6-7.

Skull base intact. Maintenance of vertebral body height.
Straightening of expected cervical lordosis. Facets are
well-aligned. Coronal reformats demonstrate a normal C1-C2
articulation..
IMPRESSION: 1.  No acute intracranial abnormality.
2. Cervical spondylosis, without acute fracture or subluxation.
3. Suboptimal evaluation from C6 inferiorly secondary to patient
size and overlying soft tissues.
4. Straightening of expected cervical lordosis could be positional,
due to muscular spasm, or ligamentous injury.

## 2017-04-14 IMAGING — CR DG CHEST 1V PORT
1 series · 1 of 1 positions shown · non-contrast
Comparison: None.

CLINICAL DATA: Status post fall down flight of stairs. Concern for
chest injury. Initial encounter.

EXAM:
PORTABLE CHEST 1 VIEW

[AP]
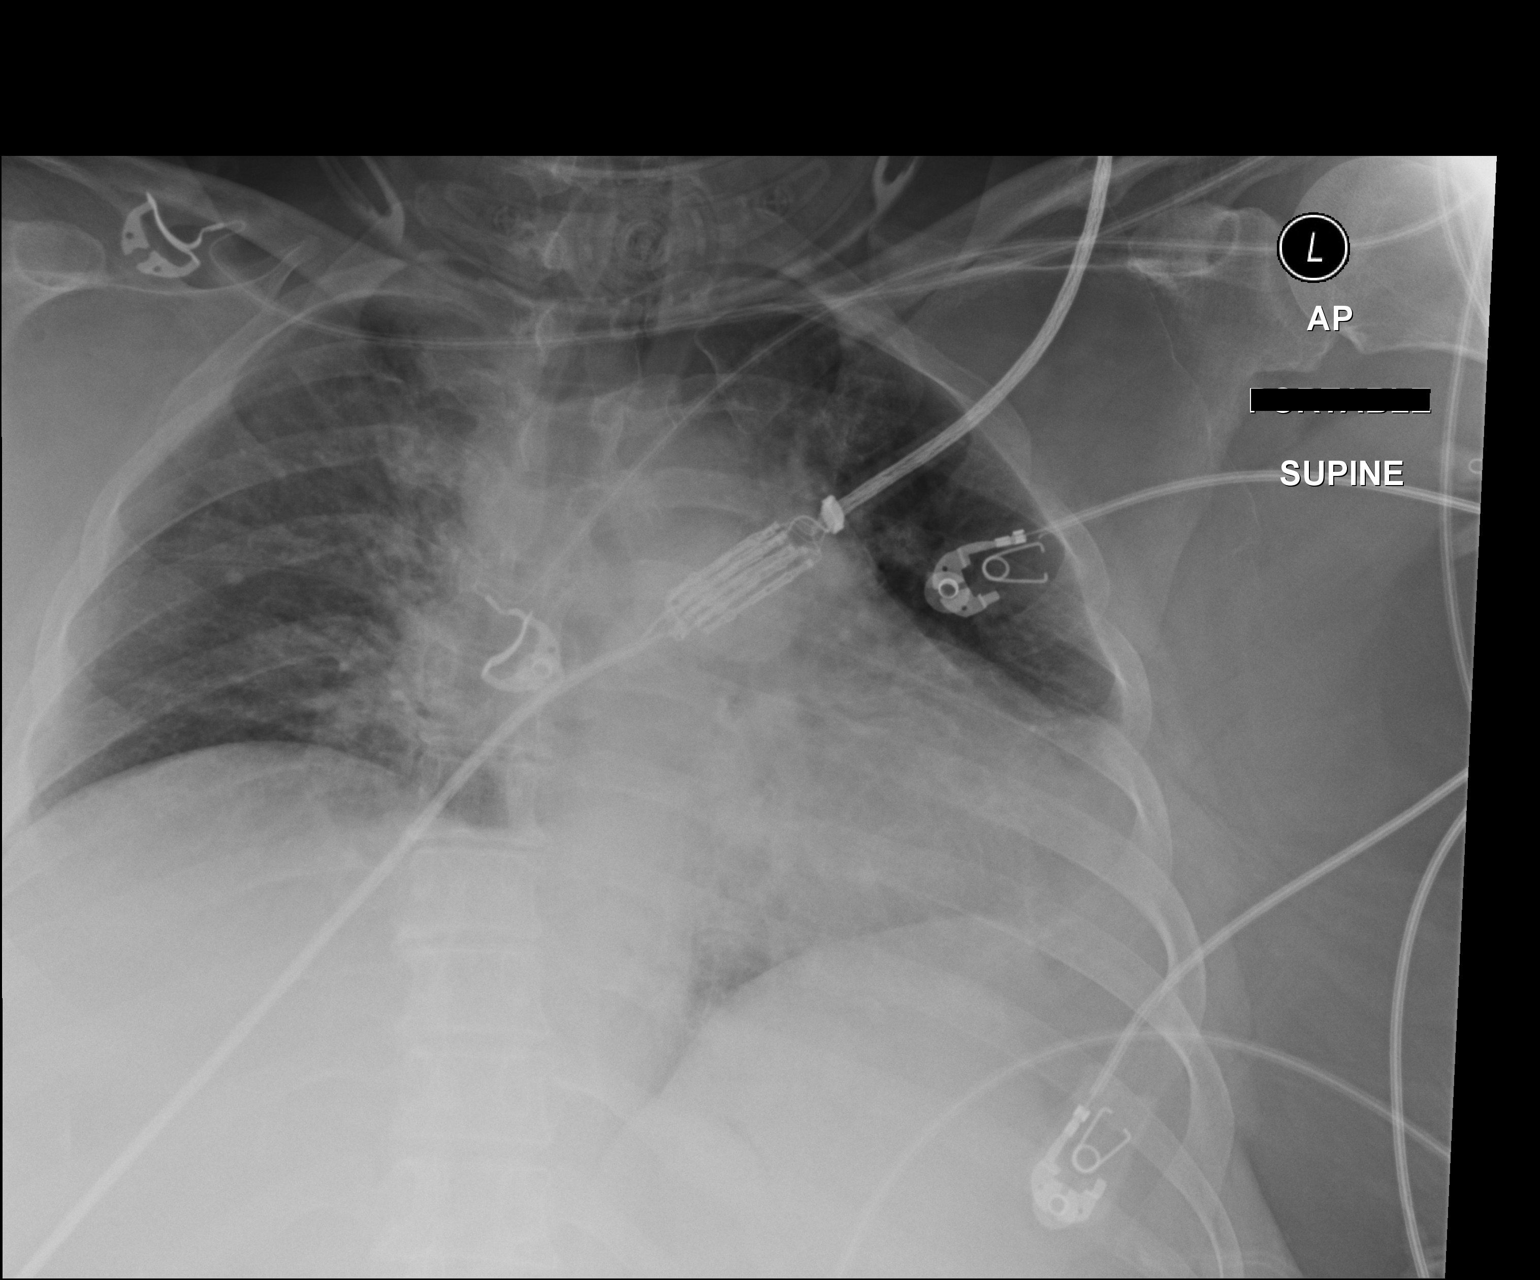

[1 of 1 positions shown; findings below may reference images not displayed]

FINDINGS: The lungs are hypoexpanded. Vascular crowding is noted. Mild left
basilar atelectasis is seen. There is no evidence of pleural
effusion or pneumothorax.

The cardiomediastinal silhouette is within normal limits. No acute
osseous abnormalities are seen.
IMPRESSION: Lungs hypoexpanded. Mild left basilar atelectasis noted. No
displaced rib fracture seen.

## 2017-04-18 DIAGNOSIS — Z23 Encounter for immunization: Secondary | ICD-10-CM | POA: Diagnosis not present

## 2017-04-22 ENCOUNTER — Encounter: Payer: Self-pay | Admitting: Neurology

## 2017-04-22 ENCOUNTER — Ambulatory Visit (INDEPENDENT_AMBULATORY_CARE_PROVIDER_SITE_OTHER): Payer: Medicare Other | Admitting: Neurology

## 2017-04-22 VITALS — BP 108/69 | HR 66 | Ht 63.0 in | Wt 232.0 lb

## 2017-04-22 DIAGNOSIS — I10 Essential (primary) hypertension: Secondary | ICD-10-CM | POA: Diagnosis not present

## 2017-04-22 DIAGNOSIS — G452 Multiple and bilateral precerebral artery syndromes: Secondary | ICD-10-CM | POA: Diagnosis not present

## 2017-04-22 DIAGNOSIS — Z9989 Dependence on other enabling machines and devices: Secondary | ICD-10-CM | POA: Diagnosis not present

## 2017-04-22 DIAGNOSIS — G4733 Obstructive sleep apnea (adult) (pediatric): Secondary | ICD-10-CM | POA: Diagnosis not present

## 2017-04-22 NOTE — Progress Notes (Signed)
[facility]  PATIENT: Jamie [name] DOB: 1945/08/16   REASON FOR VISIT: Follow-up for TIA, migraines, CPAP with Dr.Pharr HISTORY FROM: Patient    HISTORY OF PRESENT ILLNESS:HISTORY (CD)Is and is here today to be evaluated for a headache, and she reports that she first had migrainous headaches when she resumed birth control pills over 40 years ago. The headaches were very debilitating at the time but there were infrequent through the 1970s. Once she discontinued hormonal contraception her headaches became as infrequent as once a year.  In the early 1980s the couple lived in [place] and [name] recalls that the bright light was a trigger for headaches -there was a strong photophobic component, and she made many trips to the local hospital for Demerol shots. She underwent a self hypnosis treatment and this worked well, controlled the migraine for years. On 9-20 02-2013 the patient woke up with a severe explosive kind of pain that seemed to arise from the right eye. The pain was centered retro-orbital and radiated backwards into the center of the head. The patient tried aspirin and Motrin with no benefit at the time as my colleague Dr. Janann Colonel D.O. did document.  This was different from her typical migraines. She was extremely nauseated but she could not vomit and developed dry heaves. The headache broker out of sound sleep at about about 3 AM. She had no tearing of the eye ( that she could remember), the nose was not runny and her face was not flushe.She was asleep with her CPAP. The patient had an eye exam in 2013 which was reportedly normal at that time and her headaches were evaluated with a brain MRI which was also reviewed and found unremarkable. Not had a number of the severe headache spell since. On 09-12-14 the patient again developed a headache this time of a new quality. She was watching TV at about 11 PM she noticed that the right vision was fluctuating she states  that her vision was disturbed by changing in light and dark sensation, that she describedas if it checker board was placed in front of her eyes. By 11.45 , she tried to answer an e mail, couldn't decipher the message, non-sensical to her. She switched and tried to read aloud, she couldn't , her speech output was not correlated to the text she read. Jibberish, while her normal speech was unaffected. On the left side this time by migraines have always attack the right side of her head. This was unusual as well. The entire episode lasted less than one hour. The emergency room obtained a CT head , non contrast, read as normal and reviewed here in imaging file. Her BP was very high. She sleeps with a CPAP, and likes it very much, she had nocturia before being diagnosed with OSA.  Her pneumonia related hospitalization in 2007 was the cause for the test by [facility], as her RN had witnessed her sleep apnea. Dr .Elsworth Soho follows her, Dr Constance Holster is her ENT.   UPDATE: Nov 28, 2014. CMMs. Jamie [name], 71 year old female returns for follow-up. She was initially evaluated by Dr.Anaiz Qazi on 09/16/2014. She is now on aspirin enteric-coated and has not had further TIA events. She also has obstructive sleep apnea and uses CPAP every night. She gets very little exercise due to chronic vertigo her transcranial Doppler showed evidence of cerebral emboli would suggest a minimal PFO not likely to be clinically significant per Dr. Tobey Grim note. Her blood pressure is well controlled today at 118/80  in the office.She returns for reevaluation.  Interval history from 10/02/2015. [name] is doing very well she has had one of the severe headaches she used to describe in the past, -onset at about 5:30 after working on the computer. She developed a severe right-sided headache. Afraid this could be another TIA I  have recommended her to go to the emergency room, she was given morphine for the headaches which then caused another reaction and side  effects. She did not have any speech arrest with this one. The emergency room did not find anything abnormal, she underwent a CT scan of the brain, which was normal, MRI brain  was normal. I'm also able today to just review a download of her CPAP machine which has been followed by Dr. Shelia Media but not in the last 3 years. She has 100% compliance for days 100% compliance for over 4 hours of daily use, average user time is 9 hours 7 minutes set pressure is 12 cm water no EPR is used residual AHI is 0.8. Excellent result. Her obesity was the same. No weight loss.  She was given a prescrition of Fioricet and ondansetron also known as Zofran. She's not supposed to take the medication before the headache component has started.   I have the pleasure of seeing [name] today on 04/22/2017, and a routine follow up for CPAP compliance. She has done excellently with her CPAP machine which is set at 12 cm water pressure without expiratory pressure relief. Her AHI is 0.7 with an average user time of 9 hours and 2 minutes each night. She had a 97% compliance, power was lost first a night due to [employer]. [employer] [facility]- will ask for a battery back up- Linzie Collin, RPSGT.  [facility] referral requested. She feels frustrated with diet and Gym classes. She drinks alcohol in moderation.  She does not smoke, no more vertigo. Normal TSH in last blood test/     REVIEW OF SYSTEMS: Full 14 system review of systems performed and notable only for those listed, all others are neg:   Obesity, geriatric depression score 5/15   Ear/Nose/Throat: Ringing in the ears, vertigo  Gastroitestinal: Urinary frequency Endocrine: Intolerance to cold Musculoskeletal: Muscle cramps joint pain, obese.  Allergy/Immunology: Environmental allergies Psychiatric: Anxiety Sleep : OSA, CPAP.    ALLERGIES: Allergies  Allergen Reactions  . Buprenorphine Hcl Rash and Shortness Of Breath  . Morphine And Related Shortness Of Breath and Rash    . Morphine And Related Shortness Of Breath  . Aspirin Other (See Comments)    REACTION: ulcers  Can take 81 mg  . Avelox [Moxifloxacin Hcl In Nacl] Hives  . Diflucan [Fluconazole] Hives    Whelps and rash c skin peeling  . Diflucan [Fluconazole] Hives  . Latex Rash  . Moxifloxacin Palpitations    Tachycardia     HOME MEDICATIONS: Outpatient Medications Prior to Visit  Medication Sig Dispense Refill  . aspirin EC 81 MG tablet Take 1 tablet (81 mg total) by mouth daily. 90 tablet 3  . BIOTIN PO Take 1 capsule by mouth at bedtime.     . clidinium-chlordiazePOXIDE (LIBRAX) 5-2.5 MG capsule Take 1 capsule by mouth 2 (two) times daily. 180 capsule 3  . clonazePAM (KLONOPIN) 1 MG tablet Take 1 mg by mouth at bedtime.     . diphenhydrAMINE (BENADRYL) 25 MG tablet Take 25 mg by mouth every 8 (eight) hours as needed for allergies.     Marland Kitchen esomeprazole (NEXIUM) 40 MG  capsule Take 1 capsule (40 mg total) by mouth 2 (two) times daily. 180 capsule 3  . fluticasone (FLONASE) 50 MCG/ACT nasal spray Place 2 sprays into the nose daily as needed for rhinitis or allergies.    Marland Kitchen linaclotide (LINZESS) 145 MCG CAPS capsule Take 1 capsule (145 mcg total) by mouth daily. 90 capsule 3  . losartan-hydrochlorothiazide (HYZAAR) 100-25 MG tablet TK 1 T PO QD  0  . metaxalone (SKELAXIN) 800 MG tablet Take 400 mg by mouth at bedtime.     . metoprolol succinate (TOPROL-XL) 100 MG 24 hr tablet Take 100 mg by mouth every evening. Take with or immediately following a meal.    . MILK THISTLE PO Take 1 capsule by mouth at bedtime.     . Multiple Vitamin (MULTIVITAMIN WITH MINERALS) TABS Take 1 tablet by mouth every evening.     . ondansetron (ZOFRAN) 4 MG tablet Take 1 tablet (4 mg total) by mouth every 8 (eight) hours as needed for nausea or vomiting. 20 tablet 0  . spironolactone (ALDACTONE) 50 MG tablet Take 50 mg by mouth every morning.    Marland Kitchen 0.9 %  sodium chloride infusion      No facility-administered medications  prior to visit.     PAST MEDICAL HISTORY: Past Medical History:  Diagnosis Date  . Allergic rhinitis   . Anxiety   . Arthritis    left foot  . Asthma   . Colon polyps    adenomatous  . Diverticulosis   . Dysrhythmia 1999   Dr Johnsie Cancel 2003  . Elevated LFTs   . Fatty liver disease, nonalcoholic   . GERD (gastroesophageal reflux disease)   . Headache(784.0)    migraines with vision changes but no pain  . Heart murmur   . Hypertension   . IBS (irritable bowel syndrome)   . Obesity   . Pneumonia 2007  . Renal cell carcinoma ([place]) 2013  . Restless leg syndrome   . Sleep apnea    moderate; wears CPAP, uncertain about setting  . Stroke [facility])    transient ischemic attack  . TIA (transient ischemic attack) 09/11/2014  . Ulcer     PAST SURGICAL HISTORY: Past Surgical History:  Procedure Laterality Date  . BREAST REDUCTION SURGERY  Jan 1991  . ELBOW SURGERY Left   . KIDNEY SURGERY Right October 2013    Tumor removed   . TONSILLECTOMY    . VAGINAL HYSTERECTOMY  1979   partial    FAMILY HISTORY: Family History  Problem Relation Age of Onset  . Heart disease Father 29  . Lung cancer Father        smoker  . Heart disease Brother   . Hypertension Other   . Colon cancer Cousin   . Allergies Daughter   . Allergies Brother   . Melanoma Daughter   . Liver disease Neg Hx   . Asthma Neg Hx   . Esophageal cancer Neg Hx   . Rectal cancer Neg Hx   . Stomach cancer Neg Hx     SOCIAL HISTORY: Social History   Social History  . Marital status: Married    Spouse name: Sallyanne Kuster  . Number of children: 2  . Years of education: 15   Occupational History  . Retired Retail buyer   Social History Main Topics  . Smoking status: Never Smoker  . Smokeless tobacco: Never Used  . Alcohol use 1.2 - 1.8 oz/week  2 - 3 Glasses of wine per week     Comment: 3-4 times a week  . Drug use: No  . Sexual activity: Yes    Birth control/ protection: Surgical    Other Topics Concern  . Not on file   Social History Narrative   ** Merged History Encounter **       Exercise 3-4 times a week.   Married Sallyanne Kuster) and lives with her spouse.   Retired Personal assistant.   Education- college   Patient is right handed.   Patient drinks tea a lot and water.     PHYSICAL EXAM  Vitals:   04/22/17 1417  BP: 108/69  Pulse: 66  Weight: 232 lb (105.2 kg)  Height: 5' 3"  (1.6 m)   Body mass index is 41.1 kg/m.    Generalized: Well developed, obese female in no acute distress  Head: normocephalic and atraumatic, Oropharynx benign , Mallampati grade 2, neck circumference 17.5 inches. She had tonsils removed at age 45. Chronic sinusitis.  Neck: Supple, no carotid bruits  Cardiac: Regular rate rhythm, no murmur  Musculoskeletal: No deformity   Neurological examination   Mentation: Alert oriented to time, place, history taking. Attention span and concentration appropriate. Recent and remote memory intact, language fluent. Cranial nerves  Sense of smell or taste. She reports smells - olfactory aura, smell of roses.  Pupils were equal round reactive to light extraocular movements were full, visual field were full on confrontational test. Facial sensation and strength were normal. hearing was intact to finger rubbing bilaterally. Uvula tongue midline. head turning and shoulder shrug were normal and symmetric.Tongue protrusion into cheek strength was normal. Motor: normal bulk and tone, full strength in the BUE, BLE, fine finger movements normal, no pronator drift. No focal weakness Sensory: normal and symmetric to light touch, pinprick, and Vibration, proprioception  Coordination: finger-nose-finger, heel-to-shin bilaterally, no dysmetria Reflexes: 2/2, Gait and Station: Rising up from seated position without assistance,  normal stance,moderate stride, good arm swing, smooth turning,Tandem gait is unsteady.  Romberg is negative, no assistive  device   DIAGNOSTIC DATA (LABS, IMAGING, TESTING) - I reviewed patient records, labs, notes, testing and imaging myself where available.  Lab Results  Component Value Date   WBC 5.3 11/22/2016   HGB 10.7 (L) 11/22/2016   HCT 31.9 (L) 11/22/2016   MCV 94.4 11/22/2016   PLT 168 11/22/2016      Component Value Date/Time   NA 138 11/22/2016 0457   K 3.9 11/22/2016 0457   CL 106 11/22/2016 0457   CO2 23 11/22/2016 0457   GLUCOSE 93 11/22/2016 0457   BUN 17 11/22/2016 0457   CREATININE 1.16 (H) 11/22/2016 0457   CALCIUM 8.9 11/22/2016 0457   PROT 6.0 (L) 11/22/2016 0457   ALBUMIN 3.3 (L) 11/22/2016 0457   AST 40 11/22/2016 0457   ALT 65 (H) 11/22/2016 0457   ALKPHOS 70 11/22/2016 0457   BILITOT 0.7 11/22/2016 0457   GFRNONAA 47 (L) 11/22/2016 0457   GFRAA 55 (L) 11/22/2016 0457     ASSESSMENT AND PLAN  71 y.o. year old female, seen here for a rv for sleep apnea and CPAP compliance.  She cannot sleep without her machine. Is highly compliant. Dr Shelia Media is following this. Last check in 3 or 4  years.  Vertigo has been a hindrance when she goes shopping especially when her gaze goes up and down left and right periods sudden changes in gaze direction bring on the vertigo and  she falls forward. She has undergone vestibular rehabilitation without success. She is awaiting an appointment with a specialist at [facility] in [place].  Part of our discussion today is if Mrs. Westley should have to go to the emergency room every time she has a visual change. The answer is for the future no only if her vision change is associated with word finding difficulties speech arrest or hemiparesis. Otherwise she should take an extra aspirin and rest. If the headache arises after the visual aura she has medication to abort it. I will be happy to refill the ondansetron should she need additional mouth nausea medication. Butalbutal is not a good chance.    Asthma;  Hypertension; Obesity;  Headache(784.0); Sleep apnea;  Elevated LFTs; Renal cell carcinoma ([place]); and TIA (transient ischemic attack)   Patient is here to follow-up on CPAP compliance, the patient has been unsucessfull in losing weight. [facility] referral.        Larey Seat, MD  [facility] [address], [place], [place] [address] [phone]  Cc Dr. Shelia Media

## 2017-05-20 DIAGNOSIS — N390 Urinary tract infection, site not specified: Secondary | ICD-10-CM | POA: Diagnosis not present

## 2017-06-19 DIAGNOSIS — Z0000 Encounter for general adult medical examination without abnormal findings: Secondary | ICD-10-CM | POA: Diagnosis not present

## 2017-06-19 DIAGNOSIS — I10 Essential (primary) hypertension: Secondary | ICD-10-CM | POA: Diagnosis not present

## 2017-06-20 DIAGNOSIS — R945 Abnormal results of liver function studies: Secondary | ICD-10-CM | POA: Diagnosis not present

## 2017-06-20 DIAGNOSIS — R740 Nonspecific elevation of levels of transaminase and lactic acid dehydrogenase [LDH]: Secondary | ICD-10-CM | POA: Diagnosis not present

## 2017-06-20 DIAGNOSIS — K760 Fatty (change of) liver, not elsewhere classified: Secondary | ICD-10-CM | POA: Diagnosis not present

## 2017-06-23 DIAGNOSIS — G2581 Restless legs syndrome: Secondary | ICD-10-CM | POA: Diagnosis not present

## 2017-06-23 DIAGNOSIS — R7303 Prediabetes: Secondary | ICD-10-CM | POA: Diagnosis not present

## 2017-06-23 DIAGNOSIS — K219 Gastro-esophageal reflux disease without esophagitis: Secondary | ICD-10-CM | POA: Diagnosis not present

## 2017-06-23 DIAGNOSIS — I10 Essential (primary) hypertension: Secondary | ICD-10-CM | POA: Diagnosis not present

## 2017-06-23 DIAGNOSIS — R292 Abnormal reflex: Secondary | ICD-10-CM | POA: Diagnosis not present

## 2017-06-23 DIAGNOSIS — R740 Nonspecific elevation of levels of transaminase and lactic acid dehydrogenase [LDH]: Secondary | ICD-10-CM | POA: Diagnosis not present

## 2017-06-23 DIAGNOSIS — R51 Headache: Secondary | ICD-10-CM | POA: Diagnosis not present

## 2017-06-23 DIAGNOSIS — Z8719 Personal history of other diseases of the digestive system: Secondary | ICD-10-CM | POA: Diagnosis not present

## 2017-06-23 DIAGNOSIS — G4733 Obstructive sleep apnea (adult) (pediatric): Secondary | ICD-10-CM | POA: Diagnosis not present

## 2017-06-23 DIAGNOSIS — L719 Rosacea, unspecified: Secondary | ICD-10-CM | POA: Diagnosis not present

## 2017-06-23 DIAGNOSIS — K760 Fatty (change of) liver, not elsewhere classified: Secondary | ICD-10-CM | POA: Diagnosis not present

## 2017-06-23 DIAGNOSIS — R32 Unspecified urinary incontinence: Secondary | ICD-10-CM | POA: Diagnosis not present

## 2017-07-24 DIAGNOSIS — M859 Disorder of bone density and structure, unspecified: Secondary | ICD-10-CM | POA: Diagnosis not present

## 2017-08-21 ENCOUNTER — Encounter (INDEPENDENT_AMBULATORY_CARE_PROVIDER_SITE_OTHER): Payer: Medicare Other

## 2017-08-25 ENCOUNTER — Ambulatory Visit (INDEPENDENT_AMBULATORY_CARE_PROVIDER_SITE_OTHER): Payer: Medicare Other | Admitting: Family Medicine

## 2017-08-25 ENCOUNTER — Encounter (INDEPENDENT_AMBULATORY_CARE_PROVIDER_SITE_OTHER): Payer: Self-pay | Admitting: Family Medicine

## 2017-08-25 VITALS — BP 103/66 | HR 71 | Temp 98.3°F | Ht 63.0 in | Wt 222.0 lb

## 2017-08-25 DIAGNOSIS — R5383 Other fatigue: Secondary | ICD-10-CM | POA: Insufficient documentation

## 2017-08-25 DIAGNOSIS — R739 Hyperglycemia, unspecified: Secondary | ICD-10-CM | POA: Diagnosis not present

## 2017-08-25 DIAGNOSIS — Z6839 Body mass index (BMI) 39.0-39.9, adult: Secondary | ICD-10-CM | POA: Diagnosis not present

## 2017-08-25 DIAGNOSIS — R0602 Shortness of breath: Secondary | ICD-10-CM | POA: Insufficient documentation

## 2017-08-25 DIAGNOSIS — R7303 Prediabetes: Secondary | ICD-10-CM | POA: Insufficient documentation

## 2017-08-25 DIAGNOSIS — K760 Fatty (change of) liver, not elsewhere classified: Secondary | ICD-10-CM | POA: Diagnosis not present

## 2017-08-25 DIAGNOSIS — Z0289 Encounter for other administrative examinations: Secondary | ICD-10-CM

## 2017-08-25 DIAGNOSIS — E559 Vitamin D deficiency, unspecified: Secondary | ICD-10-CM | POA: Diagnosis not present

## 2017-08-25 DIAGNOSIS — Z1331 Encounter for screening for depression: Secondary | ICD-10-CM

## 2017-08-25 NOTE — Progress Notes (Signed)
.  Office: [phone]  /  Fax: [phone]   HPI:   Chief Complaint: OBESITY  Jamie [name] (MR# [id number]) is a 72 y.o. female who presents on 08/25/2017 for obesity evaluation and treatment. Current BMI is Body mass index is 39.33 kg/m.Jamie [name] has struggled with obesity for years and has been unsuccessful in either losing weight or maintaining long term weight loss. Jamie [name] attended our information session and states she is currently in the action stage of change and ready to dedicate time achieving and maintaining a healthier weight.   Jamie [name] states her family eats meals together she thinks her family will eat healthier with  her she struggles with family and or coworkers weight loss sabotage her desired weight loss is 92 lbs she started gaining weight in 1986 her heaviest weight ever was 230's lbs she has significant food cravings issues  she snacks frequently in the evenings she skips meals frequently she is frequently drinking liquids with calories she frequently eats larger portions than normal  she struggles with emotional eating    Fatigue Jamie [name] feels her energy is lower than it should be. This has worsened with weight gain and has not worsened recently. Jamie [name] admits to daytime somnolence and  denies waking up still tired. Patient has a history of sleep apnea and uses CPAP. Patent has a history of symptoms of daytime fatigue. Patient generally gets 9 hours of sleep per night, and states they generally have generally restful sleep. Snoring is not present. Apneic episodes are not present. Epworth Sleepiness Score is 4.  Dyspnea on exertion Valari notes increasing shortness of breath with exercising and seems to be worsening over time with weight gain. She notes getting out of breath sooner with activity than she used to. This has not gotten worse recently. Jamie [name] denies orthopnea.  Vitamin D Deficiency Jamie [name] has a diagnosis of vitamin D deficiency. She is on multivitamin and  has no recent Vit D lab results. She denies nausea, vomiting or muscle weakness.  Hyperglycemia Jamie [name] has a diagnosis of hyperglycemia. She has no history of diabetes mellitus. She notes polyphagia.  Fatty Liver  Jamie [name] has a diagnosis of fatty liver. She denies abdominal pain or jaundice.  Depression Screen Jamie [name]'s Food and Mood (modified PHQ-9) score was  Depression screen PHQ 2/9 08/25/2017  Decreased Interest 1  Down, Depressed, Hopeless 1  PHQ - 2 Score 2  Altered sleeping 0  Tired, decreased energy 1  Change in appetite 1  Feeling bad or failure about yourself  0  Trouble concentrating 2  Moving slowly or fidgety/restless 0  Suicidal thoughts 0  PHQ-9 Score 6  Difficult doing work/chores Somewhat difficult    ALLERGIES: Allergies  Allergen Reactions  . Buprenorphine Hcl Rash and Shortness Of Breath  . Morphine And Related Shortness Of Breath and Rash  . Morphine And Related Shortness Of Breath  . Aspirin Other (See Comments)    REACTION: ulcers  Can take 81 mg  . Avelox [Moxifloxacin Hcl In Nacl] Hives  . Diflucan [Fluconazole] Hives    Whelps and rash c skin peeling  . Diflucan [Fluconazole] Hives  . Latex Rash  . Moxifloxacin Palpitations    Tachycardia     MEDICATIONS: Current Outpatient Medications on File Prior to Visit  Medication Sig Dispense Refill  . aspirin EC 81 MG tablet Take 1 tablet (81 mg total) by mouth daily. 90 tablet 3  . BIOTIN PO Take 1 capsule by mouth at bedtime.     Marland Kitchen  clidinium-chlordiazePOXIDE (LIBRAX) 5-2.5 MG capsule Take 1 capsule by mouth 2 (two) times daily. 180 capsule 3  . clonazePAM (KLONOPIN) 1 MG tablet Take 1 mg by mouth at bedtime.     . diphenhydrAMINE (BENADRYL) 25 MG tablet Take 25 mg by mouth every 8 (eight) hours as needed for allergies.     Marland Kitchen esomeprazole (NEXIUM) 40 MG capsule Take 1 capsule (40 mg total) by mouth 2 (two) times daily. 180 capsule 3  . estrogens, conjugated, (PREMARIN) 0.625 MG tablet Take 0.625 mg  by mouth every other day. Take daily for 21 days then do not take for 7 days.    . fluticasone (FLONASE) 50 MCG/ACT nasal spray Place 2 sprays into the nose daily as needed for rhinitis or allergies.    Marland Kitchen linaclotide (LINZESS) 145 MCG CAPS capsule Take 1 capsule (145 mcg total) by mouth daily. 90 capsule 3  . losartan-hydrochlorothiazide (HYZAAR) 100-25 MG tablet TK 1 T PO QD  0  . metaxalone (SKELAXIN) 800 MG tablet Take 400 mg by mouth at bedtime.     . metoprolol succinate (TOPROL-XL) 100 MG 24 hr tablet Take 100 mg by mouth every evening. Take with or immediately following a meal.    . MILK THISTLE PO Take 1 capsule by mouth at bedtime.     . Multiple Vitamin (MULTIVITAMIN WITH MINERALS) TABS Take 1 tablet by mouth every evening.     . Potassium 99 MG TABS Take 1 tablet by mouth daily.    . Probiotic Product (PROBIOTIC-10 PO) Take 1 capsule by mouth daily.    Marland Kitchen spironolactone (ALDACTONE) 50 MG tablet Take 50 mg by mouth every morning.     No current facility-administered medications on file prior to visit.     PAST MEDICAL HISTORY: Past Medical History:  Diagnosis Date  . Allergic rhinitis   . Anxiety   . Arthritis    left foot  . Asthma   . Back pain   . Colon polyps    adenomatous  . Constipation   . Diverticulosis   . Dyspnea   . Dysrhythmia 1999   Dr Johnsie Cancel 2003  . Elevated LFTs   . Fatty liver disease, nonalcoholic   . GERD (gastroesophageal reflux disease)   . Headache(784.0)    migraines with vision changes but no pain  . Heart murmur   . Hypertension   . IBS (irritable bowel syndrome)   . Joint pain   . Migraines   . Muscle cramps   . Obesity   . Palpitations   . Pneumonia 2007  . Renal cell carcinoma ([place]) 2013  . Restless leg syndrome   . Sleep apnea    moderate; wears CPAP, uncertain about setting  . Stroke [facility])    transient ischemic attack  . TIA (transient ischemic attack) 09/11/2014  . Ulcer   . Urinary incontinence   . Vitamin D deficiency      PAST SURGICAL HISTORY: Past Surgical History:  Procedure Laterality Date  . BREAST REDUCTION SURGERY  Jan 1991  . ELBOW SURGERY Left   . KIDNEY SURGERY Right October 2013    Tumor removed   . TONSILLECTOMY    . VAGINAL HYSTERECTOMY  1979   partial    SOCIAL HISTORY: Social History   Tobacco Use  . Smoking status: Never Smoker  . Smokeless tobacco: Never Used  Substance Use Topics  . Alcohol use: Yes    Alcohol/week: 1.2 - 1.8 oz    Types: 2 - 3  Glasses of wine per week    Comment: 3-4 times a week  . Drug use: No    FAMILY HISTORY: Family History  Problem Relation Age of Onset  . Heart disease Father 44  . Lung cancer Father        smoker  . Hypertension Mother   . Anxiety disorder Mother   . Heart disease Brother   . Hypertension Other   . Colon cancer Cousin   . Allergies Daughter   . Allergies Brother   . Melanoma Daughter   . Liver disease Neg Hx   . Asthma Neg Hx   . Esophageal cancer Neg Hx   . Rectal cancer Neg Hx   . Stomach cancer Neg Hx     ROS: Review of Systems  Constitutional: Positive for malaise/fatigue. Negative for weight loss.  HENT: Positive for tinnitus.        + hay fever  Eyes:       Wear glasses or contacts Negative jaundice  Respiratory: Positive for shortness of breath (with exertion).   Cardiovascular: Negative for orthopnea.       + very cold feet or hands  Gastrointestinal: Positive for constipation. Negative for abdominal pain, nausea and vomiting.  Musculoskeletal: Positive for back pain.       + Muscle or joint pain + Muscle stiffness Negative muscle weakness  Skin:       + Dryness  Neurological: Positive for dizziness (BPPV) and headaches.  Endo/Heme/Allergies:       Positive polyphagia  Psychiatric/Behavioral: Positive for depression. Negative for suicidal ideas.    PHYSICAL EXAM: Blood pressure 103/66, pulse 71, temperature 98.3 F (36.8 C), temperature source Oral, height 5' 3"  (1.6 m), weight 222 lb  (100.7 kg), SpO2 94 %. Body mass index is 39.33 kg/m. Physical Exam  Constitutional: She is oriented to person, place, and time. She appears well-developed and well-nourished.  HENT:  Head: Normocephalic and atraumatic.  Nose: Nose normal.  Eyes: EOM are normal. No scleral icterus.  Neck: Normal range of motion. Neck supple. No thyromegaly present.  Cardiovascular: Normal rate and regular rhythm.  Murmur (1/6 early systolic murmur) heard. Pulmonary/Chest: Effort normal. No respiratory distress.  Abdominal: Soft. There is no tenderness.  + Obesity  Musculoskeletal:  Range of Motion normal in all 4 extremities Trace edema noted in bilateral lower extremities  Neurological: She is alert and oriented to person, place, and time. Coordination normal.  Skin: Skin is warm and dry.  Psychiatric: She has a normal mood and affect. Her behavior is normal.  Vitals reviewed.   RECENT LABS AND TESTS: BMET    Component Value Date/Time   NA 138 11/22/2016 0457   K 3.9 11/22/2016 0457   CL 106 11/22/2016 0457   CO2 23 11/22/2016 0457   GLUCOSE 93 11/22/2016 0457   BUN 17 11/22/2016 0457   CREATININE 1.16 (H) 11/22/2016 0457   CALCIUM 8.9 11/22/2016 0457   GFRNONAA 47 (L) 11/22/2016 0457   GFRAA 55 (L) 11/22/2016 0457   Lab Results  Component Value Date   HGBA1C 5.4 12/16/2011   No results found for: INSULIN CBC    Component Value Date/Time   WBC 5.3 11/22/2016 0457   RBC 3.38 (L) 11/22/2016 0457   HGB 10.7 (L) 11/22/2016 0457   HCT 31.9 (L) 11/22/2016 0457   PLT 168 11/22/2016 0457   MCV 94.4 11/22/2016 0457   MCH 31.7 11/22/2016 0457   MCHC 33.5 11/22/2016 0457   RDW 12.6 11/22/2016  [place] 1.5 11/20/2016 2004   MONOABS 0.9 11/20/2016 2004   EOSABS 0.1 11/20/2016 2004   BASOSABS 0.0 11/20/2016 2004   Iron/TIBC/Ferritin/ %Sat No results found for: IRON, TIBC, FERRITIN, IRONPCTSAT Lipid Panel     Component Value Date/Time   CHOL 168 08/24/2010 1134   TRIG 85.0  08/24/2010 1134   HDL 65.40 08/24/2010 1134   CHOLHDL 3 08/24/2010 1134   VLDL 17.0 08/24/2010 1134   LDLCALC 86 08/24/2010 1134   Hepatic Function Panel     Component Value Date/Time   PROT 6.0 (L) 11/22/2016 0457   ALBUMIN 3.3 (L) 11/22/2016 0457   AST 40 11/22/2016 0457   ALT 65 (H) 11/22/2016 0457   ALKPHOS 70 11/22/2016 0457   BILITOT 0.7 11/22/2016 0457   BILIDIR 0.2 08/24/2010 1134      Component Value Date/Time   TSH 1.12 05/03/2011 1239   Vitamin D No recent labs  ECG  shows NSR with a rate of 69 BPM INDIRECT CALORIMETER done today shows a VO2 of 271 and a REE of 1884. Her calculated basal metabolic rate is 7371 thus her basal metabolic rate is better than expected.    ASSESSMENT AND PLAN: Other fatigue - Plan: EKG 12-Lead, CBC With Differential, Lipid Panel With LDL/HDL Ratio, T3, T4, free, TSH  Shortness of breath on exertion  Vitamin D deficiency - Plan: VITAMIN D 25 Hydroxy (Vit-D Deficiency, Fractures)  Hyperglycemia - Plan: Comprehensive metabolic panel, Hemoglobin A1c, Insulin, random  Fatty liver  Depression screening  Class 2 severe obesity with serious comorbidity and body mass index (BMI) of 39.0 to 39.9 in adult, unspecified obesity type (HCC)  PLAN:  Fatigue Earnstine was informed that her fatigue may be related to obesity, depression or many other causes. Labs will be ordered, and in the meanwhile Kaidyn has agreed to work on diet, exercise and weight loss to help with fatigue. Proper sleep hygiene was discussed including the need for 7-8 hours of quality sleep each night. A sleep study was not ordered based on symptoms and Epworth score.  Dyspnea on exertion Pina's shortness of breath appears to be obesity related and exercise induced. She has agreed to work on weight loss and gradually increase exercise to treat her exercise induced shortness of breath. If Farran follows our instructions and loses weight without improvement of her shortness of  breath, we will plan to refer to pulmonology. We will monitor this condition regularly. Caitlyn agrees to this plan.  Vitamin D Deficiency Annarae was informed that low vitamin D levels contributes to fatigue and are associated with obesity, breast, and colon cancer. Shaneika agrees to continue multivitamins and will follow up for routine testing of vitamin D, at least 2-3 times per year. She was informed of the risk of over-replacement of vitamin D and agrees to not increase her dose unless she discusses this with [name] first. We will check labs and Tyquasia agrees to follow up with our clinic in 2 weeks.  Hyperglycemia We will check labs and Sabel agrees to follow up with our clinic in 2 weeks.  Fatty Liver We discussed the likely diagnosis of non alcoholic fatty liver disease today and how this condition is obesity related. We discussed the importance of weight loss to treat fatty liver. Shantera agrees to continue with her weight loss efforts with healthier diet and exercise as an essential part of her treatment plan. We will check labs and Ashey agrees to follow up with our clinic in 2  weeks.  Depression Screen Sandia had a mildly positive depression screening. Depression is commonly associated with obesity and often results in emotional eating behaviors. We will monitor this closely and work on CBT to help improve the non-hunger eating patterns. Referral to Psychology may be required if no improvement is seen as she continues in our clinic.  Obesity Mirra is currently in the action stage of change and her goal is to continue with weight loss efforts She has agreed to follow the Category 2 plan + 100 calories Kimi has been instructed to work up to a goal of 150 minutes of combined cardio and strengthening exercise per week for weight loss and overall health benefits. We discussed the following Behavioral Modification Strategies today: increasing lean protein intake, decrease eating out, and no skipping  meals  Niomi has agreed to follow up with our clinic in 2 weeks. She was informed of the importance of frequent follow up visits to maximize her success with intensive lifestyle modifications for her multiple health conditions. She was informed we would discuss her lab results at her next visit unless there is a critical issue that needs to be addressed sooner. Tatumn agreed to keep her next visit at the agreed upon time to discuss these results.    OBESITY BEHAVIORAL INTERVENTION VISIT  Today's visit was # 1 out of 22.  Starting weight: 222 lbs Starting date: 08/25/17 Today's weight : 222 lbs  Today's date: 08/25/2017 Total lbs lost to date: 0 (Patients must lose 7 lbs in the first 6 months to continue with counseling)   ASK: We discussed the diagnosis of obesity with Quita Skye today and Suzanne agreed to give [name] permission to discuss obesity behavioral modification therapy today.  ASSESS: Yaminah has the diagnosis of obesity and her BMI today is 39.34 Shifa is in the action stage of change   ADVISE: Micaela was educated on the multiple health risks of obesity as well as the benefit of weight loss to improve her health. She was advised of the need for long term treatment and the importance of lifestyle modifications.  AGREE: Multiple dietary modification options and treatment options were discussed and  Devynne agreed to the above obesity treatment plan.   I, Trixie Dredge, am acting as transcriptionist for Dennard Nip, MD    I have reviewed the above documentation for accuracy and completeness, and I agree with the above. -Dennard Nip, MD

## 2017-08-26 LAB — COMPREHENSIVE METABOLIC PANEL
ALBUMIN: 4.6 g/dL (ref 3.6–4.8)
ALT: 73 IU/L — ABNORMAL HIGH (ref 0–32)
AST: 35 IU/L (ref 0–40)
Albumin/Globulin Ratio: 1.9 (ref 1.2–2.2)
Alkaline Phosphatase: 73 IU/L (ref 39–117)
BILIRUBIN TOTAL: 0.3 mg/dL (ref 0.0–1.2)
BUN / CREAT RATIO: 16 (ref 12–28)
BUN: 15 mg/dL (ref 8–27)
CHLORIDE: 97 mmol/L (ref 96–106)
CO2: 23 mmol/L (ref 20–29)
Calcium: 10.1 mg/dL (ref 8.7–10.3)
Creatinine, Ser: 0.94 mg/dL (ref 0.57–1.00)
GFR calc Af Amer: 72 mL/min/{1.73_m2} (ref >59)
GFR calc non Af Amer: 62 mL/min/{1.73_m2} (ref >59)
GLOBULIN, TOTAL: 2.4 g/dL (ref 1.5–4.5)
GLUCOSE: 105 mg/dL — AB (ref 65–99)
Potassium: 4.8 mmol/L (ref 3.5–5.2)
SODIUM: 136 mmol/L (ref 134–144)
Total Protein: 7 g/dL (ref 6.0–8.5)

## 2017-08-26 LAB — CBC WITH DIFFERENTIAL
BASOS: 0 %
Basophils Absolute: 0 10*3/uL (ref 0.0–0.2)
EOS (ABSOLUTE): 0.1 10*3/uL (ref 0.0–0.4)
Eos: 1 %
Hematocrit: 37 % (ref 34.0–46.6)
Hemoglobin: 12.7 g/dL (ref 11.1–15.9)
Immature Grans (Abs): 0 10*3/uL (ref 0.0–0.1)
Immature Granulocytes: 0 %
LYMPHS ABS: 1.2 10*3/uL (ref 0.7–3.1)
Lymphs: 18 %
MCH: 31.8 pg (ref 26.6–33.0)
MCHC: 34.3 g/dL (ref 31.5–35.7)
MCV: 93 fL (ref 79–97)
MONOS ABS: 0.5 10*3/uL (ref 0.1–0.9)
Monocytes: 8 %
NEUTROS PCT: 73 %
Neutrophils Absolute: 4.5 10*3/uL (ref 1.4–7.0)
RBC: 3.99 x10E6/uL (ref 3.77–5.28)
RDW: 13.2 % (ref 12.3–15.4)
WBC: 6.2 10*3/uL (ref 3.4–10.8)

## 2017-08-26 LAB — VITAMIN D 25 HYDROXY (VIT D DEFICIENCY, FRACTURES): Vit D, 25-Hydroxy: 38.3 ng/mL (ref 30.0–100.0)

## 2017-08-26 LAB — LIPID PANEL WITH LDL/HDL RATIO
Cholesterol, Total: 176 mg/dL (ref 100–199)
HDL: 84 mg/dL (ref >39)
LDL CALC: 74 mg/dL (ref 0–99)
LDL/HDL RATIO: 0.9 ratio (ref 0.0–3.2)
Triglycerides: 91 mg/dL (ref 0–149)
VLDL Cholesterol Cal: 18 mg/dL (ref 5–40)

## 2017-08-26 LAB — T3: T3 TOTAL: 114 ng/dL (ref 71–180)

## 2017-08-26 LAB — HEMOGLOBIN A1C
ESTIMATED AVERAGE GLUCOSE: 108 mg/dL
HEMOGLOBIN A1C: 5.4 % (ref 4.8–5.6)

## 2017-08-26 LAB — T4, FREE: FREE T4: 1.24 ng/dL (ref 0.82–1.77)

## 2017-08-26 LAB — INSULIN, RANDOM: INSULIN: 50.3 u[IU]/mL — ABNORMAL HIGH (ref 2.6–24.9)

## 2017-08-26 LAB — TSH: TSH: 1.9 u[IU]/mL (ref 0.450–4.500)

## 2017-09-03 ENCOUNTER — Encounter (INDEPENDENT_AMBULATORY_CARE_PROVIDER_SITE_OTHER): Payer: Self-pay | Admitting: Family Medicine

## 2017-09-05 ENCOUNTER — Encounter (INDEPENDENT_AMBULATORY_CARE_PROVIDER_SITE_OTHER): Payer: Self-pay | Admitting: Family Medicine

## 2017-09-08 ENCOUNTER — Ambulatory Visit (INDEPENDENT_AMBULATORY_CARE_PROVIDER_SITE_OTHER): Payer: Medicare Other | Admitting: Family Medicine

## 2017-09-08 VITALS — BP 88/55 | HR 63 | Temp 98.5°F | Ht 63.0 in | Wt 220.0 lb

## 2017-09-08 DIAGNOSIS — I10 Essential (primary) hypertension: Secondary | ICD-10-CM | POA: Diagnosis not present

## 2017-09-08 DIAGNOSIS — Z9189 Other specified personal risk factors, not elsewhere classified: Secondary | ICD-10-CM

## 2017-09-08 DIAGNOSIS — Z6839 Body mass index (BMI) 39.0-39.9, adult: Secondary | ICD-10-CM

## 2017-09-08 DIAGNOSIS — E66812 Obesity, class 2: Secondary | ICD-10-CM

## 2017-09-08 DIAGNOSIS — E559 Vitamin D deficiency, unspecified: Secondary | ICD-10-CM

## 2017-09-08 DIAGNOSIS — E88819 Insulin resistance, unspecified: Secondary | ICD-10-CM

## 2017-09-08 DIAGNOSIS — E8881 Metabolic syndrome: Secondary | ICD-10-CM

## 2017-09-08 MED ORDER — VITAMIN D (ERGOCALCIFEROL) 1.25 MG (50000 UNIT) PO CAPS: 50000.0000 [IU] | ORAL_CAPSULE | ORAL | 0 refills | Status: DC

## 2017-09-08 MED ORDER — METFORMIN HCL 500 MG PO TABS: 500.0000 mg | ORAL_TABLET | Freq: Every day | ORAL | 0 refills | Status: DC

## 2017-09-08 NOTE — Progress Notes (Signed)
Office: [phone]  /  Fax: [phone]   HPI:   Chief Complaint: OBESITY Jamie [name] is here to discuss her progress with her obesity treatment plan. She is on the Category 2 plan + 100 calories and is following her eating plan approximately 90 % of the time. She states she is walking the treadmill for 15 minutes 3-4 times per week. Jamie [name] struggled to follow her plan closely as she felt it was too much food especially for dinner. She felt the volume aggravated her diverticulosis and felt like we were "force feeding" her. Her daily calorie intake on our plan was 1200-1300. Her RMR was almost 1900.  Her weight is 220 lb (99.8 kg) today and has had a weight loss of 2 pounds over a period of 2 weeks since her last visit. She has lost 2 lbs since starting treatment with [name].  Hypertension Jamie [name] is a 72 y.o. female with hypertension. Jamie [name] is on Hyzaar, metoprolol, and spironolactone, blood pressure is low but feels no lightheadedness, headache, or unsteady on her feet. She is working weight loss to help control her blood pressure with the goal of decreasing her risk of heart attack and stroke. Jamie [name]'s blood pressure is not currently controlled.  Vitamin D Deficiency Jamie [name] has a new diagnosis of vitamin D deficiency. She is not currently taking Vit D and she notes fatigue and denies nausea, vomiting or muscle weakness.  Insulin Resistance Jamie [name] has a new diagnosis of insulin resistance based on her very elevated fasting insulin level >5. She denies polyphagia. She has had multiple episodes of hypoglycemia in the past but none in the last 2 weeks on her Category 2 plan. Although Jamie [name]'s blood glucose readings are still under good control, insulin resistance puts her at greater risk of metabolic syndrome and diabetes. She is not taking metformin currently and continues to work on diet and exercise to decrease risk of diabetes.  At risk for diabetes Jamie [name] is at higher than average risk for  developing diabetes due to her obesity and insulin resistance. She currently denies polyuria or polydipsia.  ALLERGIES: Allergies  Allergen Reactions  . Buprenorphine Hcl Rash and Shortness Of Breath  . Morphine And Related Shortness Of Breath and Rash  . Morphine And Related Shortness Of Breath  . Aspirin Other (See Comments)    REACTION: ulcers  Can take 81 mg  . Avelox [Moxifloxacin Hcl In Nacl] Hives  . Diflucan [Fluconazole] Hives    Jamie [name] and rash c skin peeling  . Diflucan [Fluconazole] Hives  . Latex Rash  . Moxifloxacin Palpitations    Tachycardia     MEDICATIONS: Current Outpatient Medications on File Prior to Visit  Medication Sig Dispense Refill  . aspirin EC 81 MG tablet Take 1 tablet (81 mg total) by mouth daily. 90 tablet 3  . BIOTIN PO Take 1 capsule by mouth at bedtime.     . clidinium-chlordiazePOXIDE (LIBRAX) 5-2.5 MG capsule Take 1 capsule by mouth 2 (two) times daily. 180 capsule 3  . clonazePAM (KLONOPIN) 1 MG tablet Take 1 mg by mouth at bedtime.     . diphenhydrAMINE (BENADRYL) 25 MG tablet Take 25 mg by mouth every 8 (eight) hours as needed for allergies.     Marland Kitchen esomeprazole (NEXIUM) 40 MG capsule Take 1 capsule (40 mg total) by mouth 2 (two) times daily. 180 capsule 3  . estrogens, conjugated, (PREMARIN) 0.625 MG tablet Take 0.625 mg by mouth every other day. Take daily for 21 days then  do not take for 7 days.    . fluticasone (FLONASE) 50 MCG/ACT nasal spray Place 2 sprays into the nose daily as needed for rhinitis or allergies.    Marland Kitchen linaclotide (LINZESS) 145 MCG CAPS capsule Take 1 capsule (145 mcg total) by mouth daily. 90 capsule 3  . losartan-hydrochlorothiazide (HYZAAR) 100-25 MG tablet TK 1 T PO QD  0  . metaxalone (SKELAXIN) 800 MG tablet Take 400 mg by mouth at bedtime.     . metoprolol succinate (TOPROL-XL) 100 MG 24 hr tablet Take 100 mg by mouth every evening. Take with or immediately following a meal.    . MILK THISTLE PO Take 1 capsule by  mouth at bedtime.     . Multiple Vitamin (MULTIVITAMIN WITH MINERALS) TABS Take 1 tablet by mouth every evening.     . Potassium 99 MG TABS Take 1 tablet by mouth daily.    . Probiotic Product (PROBIOTIC-10 PO) Take 1 capsule by mouth daily.    Marland Kitchen spironolactone (ALDACTONE) 50 MG tablet Take 50 mg by mouth every morning.     No current facility-administered medications on file prior to visit.     PAST MEDICAL HISTORY: Past Medical History:  Diagnosis Date  . Allergic rhinitis   . Anxiety   . Arthritis    left foot  . Asthma   . Back pain   . Colon polyps    adenomatous  . Constipation   . Diverticulosis   . Dyspnea   . Dysrhythmia 1999   Dr Johnsie Cancel 2003  . Elevated LFTs   . Fatty liver disease, nonalcoholic   . GERD (gastroesophageal reflux disease)   . Headache(784.0)    migraines with vision changes but no pain  . Heart murmur   . Hypertension   . IBS (irritable bowel syndrome)   . Joint pain   . Migraines   . Muscle cramps   . Obesity   . Palpitations   . Pneumonia 2007  . Renal cell carcinoma ([place]) 2013  . Restless leg syndrome   . Sleep apnea    moderate; wears CPAP, uncertain about setting  . Stroke [facility])    transient ischemic attack  . TIA (transient ischemic attack) 09/11/2014  . Ulcer   . Urinary incontinence   . Vitamin D deficiency     PAST SURGICAL HISTORY: Past Surgical History:  Procedure Laterality Date  . BREAST REDUCTION SURGERY  Jan 1991  . ELBOW SURGERY Left   . KIDNEY SURGERY Right October 2013    Tumor removed   . TONSILLECTOMY    . VAGINAL HYSTERECTOMY  1979   partial    SOCIAL HISTORY: Social History   Tobacco Use  . Smoking status: Never Smoker  . Smokeless tobacco: Never Used  Substance Use Topics  . Alcohol use: Yes    Alcohol/week: 1.2 - 1.8 oz    Types: 2 - 3 Glasses of wine per week    Comment: 3-4 times a week  . Drug use: No    FAMILY HISTORY: Family History  Problem Relation Age of Onset  . Heart disease  Father 21  . Lung cancer Father        smoker  . Hypertension Mother   . Anxiety disorder Mother   . Heart disease Brother   . Hypertension Other   . Colon cancer Cousin   . Allergies Daughter   . Allergies Brother   . Melanoma Daughter   . Liver disease Neg Hx   .  Asthma Neg Hx   . Esophageal cancer Neg Hx   . Rectal cancer Neg Hx   . Stomach cancer Neg Hx     ROS: Review of Systems  Constitutional: Positive for malaise/fatigue and weight loss.  Gastrointestinal: Negative for nausea and vomiting.  Genitourinary: Negative for frequency.  Musculoskeletal:       Negative muscle weakness  Neurological: Negative for headaches.       Negative lightheadedness  Endo/Heme/Allergies: Negative for polydipsia.       Negative polyphagia Negative hypoglycemia    PHYSICAL EXAM: Blood pressure (!) 88/55, pulse 63, temperature 98.5 F (36.9 C), temperature source Oral, height 5' 3"  (1.6 m), weight 220 lb (99.8 kg), SpO2 95 %. Body mass index is 38.97 kg/m. Physical Exam  Constitutional: She is oriented to person, place, and time. She appears well-developed and well-nourished.  Cardiovascular: Normal rate.  Pulmonary/Chest: Effort normal.  Musculoskeletal: Normal range of motion.  Neurological: She is oriented to person, place, and time.  Skin: Skin is warm and dry.  Psychiatric: She has a normal mood and affect. Her behavior is normal.  Vitals reviewed.   RECENT LABS AND TESTS: BMET    Component Value Date/Time   NA 136 08/25/2017 1047   K 4.8 08/25/2017 1047   CL 97 08/25/2017 1047   CO2 23 08/25/2017 1047   GLUCOSE 105 (H) 08/25/2017 1047   GLUCOSE 93 11/22/2016 0457   BUN 15 08/25/2017 1047   CREATININE 0.94 08/25/2017 1047   CALCIUM 10.1 08/25/2017 1047   GFRNONAA 62 08/25/2017 1047   GFRAA 72 08/25/2017 1047   Lab Results  Component Value Date   HGBA1C 5.4 08/25/2017   HGBA1C 5.4 12/16/2011   HGBA1C 5.7 05/03/2011   HGBA1C 5.5 02/15/2010   HGBA1C 5.5  01/13/2009   Lab Results  Component Value Date   INSULIN 50.3 (H) 08/25/2017   CBC    Component Value Date/Time   WBC 6.2 08/25/2017 1047   WBC 5.3 11/22/2016 0457   RBC 3.99 08/25/2017 1047   RBC 3.38 (L) 11/22/2016 0457   HGB 12.7 08/25/2017 1047   HCT 37.0 08/25/2017 1047   PLT 168 11/22/2016 0457   MCV 93 08/25/2017 1047   MCH 31.8 08/25/2017 1047   MCH 31.7 11/22/2016 0457   MCHC 34.3 08/25/2017 1047   MCHC 33.5 11/22/2016 0457   RDW 13.2 08/25/2017 1047   LYMPHSABS 1.2 08/25/2017 1047   MONOABS 0.9 11/20/2016 2004   EOSABS 0.1 08/25/2017 1047   BASOSABS 0.0 08/25/2017 1047   Iron/TIBC/Ferritin/ %Sat No results found for: IRON, TIBC, FERRITIN, IRONPCTSAT Lipid Panel     Component Value Date/Time   CHOL 176 08/25/2017 1047   TRIG 91 08/25/2017 1047   HDL 84 08/25/2017 1047   CHOLHDL 3 08/24/2010 1134   VLDL 17.0 08/24/2010 1134   LDLCALC 74 08/25/2017 1047   Hepatic Function Panel     Component Value Date/Time   PROT 7.0 08/25/2017 1047   ALBUMIN 4.6 08/25/2017 1047   AST 35 08/25/2017 1047   ALT 73 (H) 08/25/2017 1047   ALKPHOS 73 08/25/2017 1047   BILITOT 0.3 08/25/2017 1047   BILIDIR 0.2 08/24/2010 1134      Component Value Date/Time   TSH 1.900 08/25/2017 1047   TSH 1.12 05/03/2011 1239   TSH 1.34 08/24/2010 1134  Results for Roma, Aireana D "[name]" (MRN [id number]) as of 09/08/2017 15:56  Ref. Range 08/25/2017 10:47  Vitamin D, 25-Hydroxy Latest Ref Range: 30.0 - 100.0 ng/mL  38.3    ASSESSMENT AND PLAN: Essential hypertension  Vitamin D deficiency - Plan: Vitamin D, Ergocalciferol, (DRISDOL) 50000 units CAPS capsule  Insulin resistance - Plan: metFORMIN (GLUCOPHAGE) 500 MG tablet  At risk for diabetes mellitus  Class 2 severe obesity with serious comorbidity and body mass index (BMI) of 39.0 to 39.9 in adult, unspecified obesity type ([place])  PLAN:  Hypertension We discussed sodium restriction, working on healthy weight loss, and a  regular exercise program as the means to achieve improved blood pressure control. Ayaat agreed with this plan and agreed to follow up as directed. We will continue to monitor her blood pressure as well as her progress with the above lifestyle modifications. She will continue her medications as prescribed and will watch for signs of hypotension as she continues her lifestyle modifications. Jamie [name] will increase H20 and we will recheck her blood pressure in 2 weeks, may need to decrease dose if no improvement. Jamie [name] agrees to follow up with our clinic in 2 weeks.  Vitamin D Deficiency Jamie [name] was informed that low vitamin D levels contributes to fatigue and are associated with obesity, breast, and colon cancer. Jamie [name] agrees to start prescription Vit D @50 ,000 IU every week #4 with no refills. She will follow up for routine testing of vitamin D, at least 2-3 times per year. She was informed of the risk of over-replacement of vitamin D and agrees to not increase her dose unless she discusses this with [name] first. Jamie [name] agrees to follow up with our clinic in 2 weeks.  Insulin Resistance Ashika will continue to work on weight loss, exercise, and decreasing simple carbohydrates in her diet to help decrease the risk of diabetes. We dicussed metformin including benefits and risks. She was informed that eating too many simple carbohydrates or too many calories at one sitting increases the likelihood of GI side effects. Jamie [name] agrees to start metformin 500 mg q AM #30 with no refills. Jamie [name] agrees to follow up with our clinic in 2 weeks as directed to monitor her progress.  Diabetes risk counselling Jamie [name] was given extended (30 minutes) diabetes prevention counseling today. She is 72 y.o. female and has risk factors for diabetes including obesity and insulin resistance. We discussed intensive lifestyle modifications today with an emphasis on weight loss as well as increasing exercise and decreasing simple carbohydrates in her  diet.  Obesity Jamie [name] is currently in the action stage of change. As such, her goal is to continue with weight loss efforts She has agreed to change to follow the Category 1 plan + 100 calories Jamie [name] has been instructed to work up to a goal of 150 minutes of combined cardio and strengthening exercise per week for weight loss and overall health benefits. We discussed the following Behavioral Modification Strategies today: increasing lean protein intake, decreasing simple carbohydrates, increasing vegetables, decrease eating out, and no skipping meals   Jamie [name] has agreed to follow up with our clinic in 2 weeks. She was informed of the importance of frequent follow up visits to maximize her success with intensive lifestyle modifications for her multiple health conditions.   OBESITY BEHAVIORAL INTERVENTION VISIT  Today's visit was # 2 out of 22.  Starting weight: 222 lbs Starting date: 08/25/17 Today's weight : 220 lbs  Today's date: 09/08/2017 Total lbs lost to date: 2 (Patients must lose 7 lbs in the first 6 months to continue with counseling)   ASK: We discussed the diagnosis of obesity with Quita Skye today and Luciann agreed  to give [name] permission to discuss obesity behavioral modification therapy today.  ASSESS: Cyra has the diagnosis of obesity and her BMI today is 38.98 [name] is in the action stage of change   ADVISE: Raelynn was educated on the multiple health risks of obesity as well as the benefit of weight loss to improve her health. She was advised of the need for long term treatment and the importance of lifestyle modifications.  AGREE: Multiple dietary modification options and treatment options were discussed and  Yashika agreed to the above obesity treatment plan.  I, Trixie Dredge, am acting as transcriptionist for Dennard Nip, MD  I have reviewed the above documentation for accuracy and completeness, and I agree with the above. -Dennard Nip, MD

## 2017-09-22 ENCOUNTER — Encounter: Payer: Self-pay | Admitting: Internal Medicine

## 2017-09-22 ENCOUNTER — Ambulatory Visit (INDEPENDENT_AMBULATORY_CARE_PROVIDER_SITE_OTHER): Payer: Medicare Other | Admitting: Internal Medicine

## 2017-09-22 VITALS — BP 98/60 | HR 73 | Ht 63.0 in | Wt 222.0 lb

## 2017-09-22 DIAGNOSIS — K760 Fatty (change of) liver, not elsewhere classified: Secondary | ICD-10-CM | POA: Diagnosis not present

## 2017-09-22 DIAGNOSIS — K589 Irritable bowel syndrome without diarrhea: Secondary | ICD-10-CM

## 2017-09-22 DIAGNOSIS — K219 Gastro-esophageal reflux disease without esophagitis: Secondary | ICD-10-CM | POA: Diagnosis not present

## 2017-09-22 MED ORDER — ESOMEPRAZOLE MAGNESIUM 40 MG PO CPDR: 40.0000 mg | DELAYED_RELEASE_CAPSULE | Freq: Two times a day (BID) | ORAL | 3 refills | Status: DC

## 2017-09-22 MED ORDER — CILIDINIUM-CHLORDIAZEPOXIDE 2.5-5 MG PO CAPS: 1.0000 | ORAL_CAPSULE | Freq: Two times a day (BID) | ORAL | 3 refills | Status: DC

## 2017-09-22 NOTE — Progress Notes (Signed)
HISTORY OF PRESENT ILLNESS:  Jamie [name] is a 72 y.o. female followed in this office for GERD, fatty liver, IBS, and adenomatous colon polyps. Chills or has a history of diverticulitis for which she was hospitalized. Last seen in this office May 2018 after recovering from her radiculitis. She subsequently underwent surveillance colonoscopy 01/13/2017. She was found to have multiple diminutive adenomas for which follow-up in 3 years recommended. Also diverticulosis of the sigmoid colon and internal hemorrhoids. She presents today for routine follow-up regarding management of her chronic GI issues. She continues on Nexium 40 mg twice daily to control reflux symptoms. No dysphagia. She requests medication refill. For her IBS she takes Librax as needed. This helps. She requests refill. She has had no further bouts of diverticulitis. Review of blood work from 08/25/2017 finds a unremarkable comprehensive metabolic panel and CBC. ALT elevated at 73. Other liver tests normal. Normal hemoglobin and platelets. She is now involved with a structured program at [place] for weight reduction. She is pleased.  REVIEW OF SYSTEMS:  All non-GI ROS negative unless otherwise stated in the history of present illness except for back pain, headaches, heart murmur, urinary leakage  Past Medical History:  Diagnosis Date  . Allergic rhinitis   . Anxiety   . Arthritis    left foot  . Asthma   . Back pain   . Colon polyps    adenomatous  . Constipation   . Diverticulosis   . Dyspnea   . Dysrhythmia 1999   Dr Johnsie Cancel 2003  . Elevated LFTs   . Fatty liver disease, nonalcoholic   . GERD (gastroesophageal reflux disease)   . Headache(784.0)    migraines with vision changes but no pain  . Heart murmur   . Hypertension   . IBS (irritable bowel syndrome)   . Joint pain   . Migraines   . Muscle cramps   . Obesity   . Palpitations   . Pneumonia 2007  . Renal cell carcinoma ([place]) 2013  . Restless leg  syndrome   . Sleep apnea    moderate; wears CPAP, uncertain about setting  . Stroke [facility])    transient ischemic attack  . TIA (transient ischemic attack) 09/11/2014  . Ulcer   . Urinary incontinence   . Vitamin D deficiency     Past Surgical History:  Procedure Laterality Date  . BREAST REDUCTION SURGERY  Jan 1991  . ELBOW SURGERY Left   . KIDNEY SURGERY Right October 2013    Tumor removed   . TONSILLECTOMY    . VAGINAL HYSTERECTOMY  1979   partial    Social History Jamie [name]  reports that  has never smoked. she has never used smokeless tobacco. She reports that she drinks about 1.2 - 1.8 oz of alcohol per week. She reports that she does not use drugs.  family history includes Allergies in her brother and daughter; Anxiety disorder in her mother; Colon cancer in her cousin; Heart disease in her brother; Heart disease (age of onset: 26) in her father; Hypertension in her mother and other; Lung cancer in her father; Melanoma in her daughter.  Allergies  Allergen Reactions  . Buprenorphine Hcl Rash and Shortness Of Breath  . Morphine And Related Shortness Of Breath and Rash  . Morphine And Related Shortness Of Breath  . Aspirin Other (See Comments)    REACTION: ulcers  Can take 81 mg  . Avelox [Moxifloxacin Hcl In Nacl] Hives  . Diflucan [Fluconazole]  Hives    Whelps and rash c skin peeling  . Diflucan [Fluconazole] Hives  . Latex Rash  . Moxifloxacin Palpitations    Tachycardia        PHYSICAL EXAMINATION: Vital signs: BP 98/60   Pulse 73   Ht 5' 3"  (1.6 m)   Wt 222 lb (100.7 kg)   BMI 39.33 kg/m   Constitutional: generally well-appearing, no acute distress Psychiatric: alert and oriented x3, cooperative Eyes: extraocular movements intact, anicteric, conjunctiva pink Mouth: oral pharynx moist, no lesions Neck: supple no lymphadenopathy Cardiovascular: heart regular rate and rhythm, no murmur Lungs: clear to auscultation bilaterally Abdomen: soft,  obese,nontender, nondistended, no obvious ascites, no peritoneal signs, normal bowel sounds, no organomegaly Rectal:omitted Extremities: no clubbing, cyanosis, or lower extremity edema bilaterally Skin: no lesions on visible extremities Neuro: No focal deficits. No asterixis.    ASSESSMENT:  #1. GERD. Symptoms controlled with twice a day PPI therapy #2. IBS. As needed Librax helping #3. History of multiple adenomatous colon polyps. Surveillance up-to-date #4. Fatty liver #5. Obesity. On weight reduction program   PLAN:  #1. Reflux precautions #2. Exercise and weight loss #3. Refill Nexium 40 mg twice a day #4. Refill Librax as needed for IBS symptoms #5. Routine office follow-up one year #6. Surveillance colonoscopy around July 2021

## 2017-09-22 NOTE — Patient Instructions (Signed)
We have sent the following medications to your pharmacy for you to pick up at your convenience:  Nexium, Librax  Please follow up in one year

## 2017-09-23 ENCOUNTER — Ambulatory Visit (INDEPENDENT_AMBULATORY_CARE_PROVIDER_SITE_OTHER): Payer: Medicare Other | Admitting: Family Medicine

## 2017-09-23 VITALS — BP 104/64 | HR 68 | Temp 98.1°F | Ht 63.0 in | Wt 217.0 lb

## 2017-09-23 DIAGNOSIS — K5909 Other constipation: Secondary | ICD-10-CM | POA: Diagnosis not present

## 2017-09-23 DIAGNOSIS — Z6838 Body mass index (BMI) 38.0-38.9, adult: Secondary | ICD-10-CM | POA: Diagnosis not present

## 2017-09-23 DIAGNOSIS — E8881 Metabolic syndrome: Secondary | ICD-10-CM | POA: Diagnosis not present

## 2017-09-23 MED ORDER — METFORMIN HCL 500 MG PO TABS: 500.0000 mg | ORAL_TABLET | Freq: Every day | ORAL | 0 refills | Status: DC

## 2017-09-24 ENCOUNTER — Other Ambulatory Visit: Payer: Self-pay | Admitting: Internal Medicine

## 2017-09-24 NOTE — Progress Notes (Signed)
Office: [phone]  /  Fax: [phone]   HPI:   Chief Complaint: OBESITY Jamie [name] is here to discuss her progress with her obesity treatment plan. She is on the Category 1 plan +100 calories and is following her eating plan approximately 95 % of the time. She states she is walking for 30 minutes 2 times per week. Jamie [name] continues to do well with weight loss on the category 2 plan. Hunger is controlled and she is dealing with sabotage better. Her weight is 217 lb (98.4 kg) today and has had a weight loss of 3 pounds over a period of 2 weeks since her last visit. She has lost 5 lbs since starting treatment with [name].  Insulin Resistance Jamie [name] has a diagnosis of insulin resistance based on her elevated fasting insulin level >5. Although Jamie [name]'s blood glucose readings are still under good control, insulin resistance puts her at greater risk of metabolic syndrome and diabetes. She started taking metformin and is tolerating it well. She is doing well with diet prescription and polyphagia has improved. Jamie [name] continues to work on diet and exercise to decrease risk of diabetes.  Constipation Jamie [name] takes Linzess as needed and notes less frequent bowel movements, but they are not hard and painful. She denies rectal bleeding.   ALLERGIES Allergies  Allergen Reactions  . Buprenorphine Hcl Rash and Shortness Of Breath  . Morphine And Related Shortness Of Breath and Rash  . Morphine And Related Shortness Of Breath  . Aspirin Other (See Comments)    REACTION: ulcers  Can take 81 mg  . Avelox [Moxifloxacin Hcl In Nacl] Hives  . Diflucan [Fluconazole] Hives    Whelps and rash c skin peeling  . Diflucan [Fluconazole] Hives  . Latex Rash  . Moxifloxacin Palpitations    Tachycardia     MEDICATIONS: Current Outpatient Medications on File Prior to Visit  Medication Sig Dispense Refill  . aspirin EC 81 MG tablet Take 1 tablet (81 mg total) by mouth daily. 90 tablet 3  . BIOTIN PO Take 1 capsule by  mouth at bedtime.     . clidinium-chlordiazePOXIDE (LIBRAX) 5-2.5 MG capsule Take 1 capsule by mouth 2 (two) times daily. 180 capsule 3  . clonazePAM (KLONOPIN) 1 MG tablet Take 1 mg by mouth at bedtime.     . diphenhydrAMINE (BENADRYL) 25 MG tablet Take 25 mg by mouth every 8 (eight) hours as needed for allergies.     Marland Kitchen esomeprazole (NEXIUM) 40 MG capsule Take 1 capsule (40 mg total) by mouth 2 (two) times daily. 180 capsule 3  . estrogens, conjugated, (PREMARIN) 0.625 MG tablet Take 0.625 mg by mouth every other day. Take daily for 21 days then do not take for 7 days.    . fluticasone (FLONASE) 50 MCG/ACT nasal spray Place 2 sprays into the nose daily as needed for rhinitis or allergies.    Marland Kitchen linaclotide (LINZESS) 145 MCG CAPS capsule Take 1 capsule (145 mcg total) by mouth daily. 90 capsule 3  . losartan-hydrochlorothiazide (HYZAAR) 100-25 MG tablet TK 1 T PO QD  0  . metaxalone (SKELAXIN) 800 MG tablet Take 400 mg by mouth at bedtime.     . metoprolol succinate (TOPROL-XL) 100 MG 24 hr tablet Take 100 mg by mouth every evening. Take with or immediately following a meal.    . MILK THISTLE PO Take 1 capsule by mouth at bedtime.     . Multiple Vitamin (MULTIVITAMIN WITH MINERALS) TABS Take 1 tablet by mouth every evening.     Marland Kitchen  Potassium 99 MG TABS Take 1 tablet by mouth daily.    . Probiotic Product (PROBIOTIC-10 PO) Take 1 capsule by mouth daily.    Marland Kitchen spironolactone (ALDACTONE) 50 MG tablet Take 50 mg by mouth every morning.    . Vitamin D, Ergocalciferol, (DRISDOL) 50000 units CAPS capsule Take 1 capsule (50,000 Units total) by mouth every 7 (seven) days. 4 capsule 0   No current facility-administered medications on file prior to visit.     PAST MEDICAL HISTORY: Past Medical History:  Diagnosis Date  . Allergic rhinitis   . Anxiety   . Arthritis    left foot  . Asthma   . Back pain   . Colon polyps    adenomatous  . Constipation   . Diverticulosis   . Dyspnea   . Dysrhythmia  1999   Dr Johnsie Cancel 2003  . Elevated LFTs   . Fatty liver disease, nonalcoholic   . GERD (gastroesophageal reflux disease)   . Headache(784.0)    migraines with vision changes but no pain  . Heart murmur   . Hypertension   . IBS (irritable bowel syndrome)   . Joint pain   . Migraines   . Muscle cramps   . Obesity   . Palpitations   . Pneumonia 2007  . Renal cell carcinoma ([place]) 2013  . Restless leg syndrome   . Sleep apnea    moderate; wears CPAP, uncertain about setting  . Stroke [facility])    transient ischemic attack  . TIA (transient ischemic attack) 09/11/2014  . Ulcer   . Urinary incontinence   . Vitamin D deficiency     PAST SURGICAL HISTORY: Past Surgical History:  Procedure Laterality Date  . BREAST REDUCTION SURGERY  Jan 1991  . ELBOW SURGERY Left   . KIDNEY SURGERY Right October 2013    Tumor removed   . TONSILLECTOMY    . VAGINAL HYSTERECTOMY  1979   partial    SOCIAL HISTORY: Social History   Tobacco Use  . Smoking status: Never Smoker  . Smokeless tobacco: Never Used  Substance Use Topics  . Alcohol use: Yes    Alcohol/week: 1.2 - 1.8 oz    Types: 2 - 3 Glasses of wine per week    Comment: 3-4 times a week  . Drug use: No    FAMILY HISTORY: Family History  Problem Relation Age of Onset  . Heart disease Father 30  . Lung cancer Father        smoker  . Hypertension Mother   . Anxiety disorder Mother   . Heart disease Brother   . Hypertension Other   . Colon cancer Cousin   . Allergies Daughter   . Allergies Brother   . Melanoma Daughter   . Liver disease Neg Hx   . Asthma Neg Hx   . Esophageal cancer Neg Hx   . Rectal cancer Neg Hx   . Stomach cancer Neg Hx     ROS: Review of Systems  Constitutional: Positive for weight loss.    PHYSICAL EXAM: Blood pressure 104/64, pulse 68, temperature 98.1 F (36.7 C), temperature source Oral, height 5' 3"  (1.6 m), weight 217 lb (98.4 kg), SpO2 97 %. Body mass index is 38.44 kg/m. Physical Exam   Constitutional: She is oriented to person, place, and time. She appears well-developed and well-nourished.  Cardiovascular: Normal rate.  Pulmonary/Chest: Effort normal.  Musculoskeletal: Normal range of motion.  Neurological: She is oriented to person, place, and time.  Skin: Skin is warm and dry.  Psychiatric: She has a normal mood and affect. Her behavior is normal.  Vitals reviewed.   RECENT LABS AND TESTS: BMET    Component Value Date/Time   NA 136 08/25/2017 1047   K 4.8 08/25/2017 1047   CL 97 08/25/2017 1047   CO2 23 08/25/2017 1047   GLUCOSE 105 (H) 08/25/2017 1047   GLUCOSE 93 11/22/2016 0457   BUN 15 08/25/2017 1047   CREATININE 0.94 08/25/2017 1047   CALCIUM 10.1 08/25/2017 1047   GFRNONAA 62 08/25/2017 1047   GFRAA 72 08/25/2017 1047   Lab Results  Component Value Date   HGBA1C 5.4 08/25/2017   HGBA1C 5.4 12/16/2011   HGBA1C 5.7 05/03/2011   HGBA1C 5.5 02/15/2010   HGBA1C 5.5 01/13/2009   Lab Results  Component Value Date   INSULIN 50.3 (H) 08/25/2017   CBC    Component Value Date/Time   WBC 6.2 08/25/2017 1047   WBC 5.3 11/22/2016 0457   RBC 3.99 08/25/2017 1047   RBC 3.38 (L) 11/22/2016 0457   HGB 12.7 08/25/2017 1047   HCT 37.0 08/25/2017 1047   PLT 168 11/22/2016 0457   MCV 93 08/25/2017 1047   MCH 31.8 08/25/2017 1047   MCH 31.7 11/22/2016 0457   MCHC 34.3 08/25/2017 1047   MCHC 33.5 11/22/2016 0457   RDW 13.2 08/25/2017 1047   LYMPHSABS 1.2 08/25/2017 1047   MONOABS 0.9 11/20/2016 2004   EOSABS 0.1 08/25/2017 1047   BASOSABS 0.0 08/25/2017 1047   Iron/TIBC/Ferritin/ %Sat No results found for: IRON, TIBC, FERRITIN, IRONPCTSAT Lipid Panel     Component Value Date/Time   CHOL 176 08/25/2017 1047   TRIG 91 08/25/2017 1047   HDL 84 08/25/2017 1047   CHOLHDL 3 08/24/2010 1134   VLDL 17.0 08/24/2010 1134   LDLCALC 74 08/25/2017 1047   Hepatic Function Panel     Component Value Date/Time   PROT 7.0 08/25/2017 1047   ALBUMIN 4.6  08/25/2017 1047   AST 35 08/25/2017 1047   ALT 73 (H) 08/25/2017 1047   ALKPHOS 73 08/25/2017 1047   BILITOT 0.3 08/25/2017 1047   BILIDIR 0.2 08/24/2010 1134      Component Value Date/Time   TSH 1.900 08/25/2017 1047   TSH 1.12 05/03/2011 1239   TSH 1.34 08/24/2010 1134   Results for Mcconahy, Saraya D "[name]" (MRN [id number]) as of 09/24/2017 12:08  Ref. Range 08/25/2017 10:47  Vitamin D, 25-Hydroxy Latest Ref Range: 30.0 - 100.0 ng/mL 38.3   ASSESSMENT AND PLAN: Other constipation  Insulin resistance - Plan: metFORMIN (GLUCOPHAGE) 500 MG tablet  Class 2 severe obesity with serious comorbidity and body mass index (BMI) of 38.0 to 38.9 in adult, unspecified obesity type ([place])  PLAN:  Insulin Resistance Jamie [name] will continue to work on weight loss, exercise, and decreasing simple carbohydrates in her diet to help decrease the risk of diabetes. We dicussed metformin including benefits and risks. She was informed that eating too many simple carbohydrates or too many calories at one sitting increases the likelihood of GI side effects. Jamie [name] requested metformin for now and prescription was written today for 1 month refill. Jamie [name] agreed to follow up with [name] as directed to monitor her progress.  Constipation Jamie [name] was informed decrease bowel movement frequency is normal while losing weight, but stools should not be hard or painful. She was advised to increase her H20 intake and work on increasing her fiber intake. High fiber foods were discussed today. We  will continue to monitor this condition.  Obesity Jamie [name] is currently in the action stage of change. As such, her goal is to continue with weight loss efforts She has agreed to follow the Category 2 plan +100 calories Jamie [name] has been instructed to work up to a goal of 150 minutes of combined cardio and strengthening exercise per week for weight loss and overall health benefits. We discussed the following Behavioral Modification Strategies  today: increase H2O intake, increasing lean protein intake, decreasing simple carbohydrates , increasing fiber rich foods and dealing with family or coworker sabotage  Jamie [name] has agreed to follow up with our clinic in 2 weeks. She was informed of the importance of frequent follow up visits to maximize her success with intensive lifestyle modifications for her multiple health conditions.   OBESITY BEHAVIORAL INTERVENTION VISIT  Today's visit was # 3 out of 22.  Starting weight: 222 lbs Starting date: 08/25/17 Today's weight : 217 lbs  Today's date: 09/23/2017 Total lbs lost to date: 5 (Patients must lose 7 lbs in the first 6 months to continue with counseling)   ASK: We discussed the diagnosis of obesity with Jamie [name] today and Jamie [name] agreed to give [name] permission to discuss obesity behavioral modification therapy today.  ASSESS: Jamie [name] has the diagnosis of obesity and her BMI today is 38.45 Jamie [name] is in the action stage of change   ADVISE: Jamie [name] was educated on the multiple health risks of obesity as well as the benefit of weight loss to improve her health. She was advised of the need for long term treatment and the importance of lifestyle modifications.  AGREE: Multiple dietary modification options and treatment options were discussed and  Jamie [name] agreed to the above obesity treatment plan.  I, Doreene Nest, am acting as transcriptionist for Dennard Nip, MD  I have reviewed the above documentation for accuracy and completeness, and I agree with the above. -Dennard Nip, MD

## 2017-09-30 ENCOUNTER — Other Ambulatory Visit (INDEPENDENT_AMBULATORY_CARE_PROVIDER_SITE_OTHER): Payer: Self-pay | Admitting: Family Medicine

## 2017-09-30 DIAGNOSIS — E88819 Insulin resistance, unspecified: Secondary | ICD-10-CM

## 2017-09-30 DIAGNOSIS — E8881 Metabolic syndrome: Secondary | ICD-10-CM

## 2017-10-07 ENCOUNTER — Other Ambulatory Visit (INDEPENDENT_AMBULATORY_CARE_PROVIDER_SITE_OTHER): Payer: Self-pay | Admitting: Physician Assistant

## 2017-10-07 ENCOUNTER — Ambulatory Visit (INDEPENDENT_AMBULATORY_CARE_PROVIDER_SITE_OTHER): Payer: Medicare Other | Admitting: Physician Assistant

## 2017-10-07 VITALS — BP 108/63 | HR 69 | Temp 97.8°F | Ht 63.0 in | Wt 211.0 lb

## 2017-10-07 DIAGNOSIS — Z9189 Other specified personal risk factors, not elsewhere classified: Secondary | ICD-10-CM

## 2017-10-07 DIAGNOSIS — E559 Vitamin D deficiency, unspecified: Secondary | ICD-10-CM

## 2017-10-07 DIAGNOSIS — Z6837 Body mass index (BMI) 37.0-37.9, adult: Secondary | ICD-10-CM

## 2017-10-07 DIAGNOSIS — E88819 Insulin resistance, unspecified: Secondary | ICD-10-CM

## 2017-10-07 DIAGNOSIS — E8881 Metabolic syndrome: Secondary | ICD-10-CM | POA: Diagnosis not present

## 2017-10-07 MED ORDER — VITAMIN D (ERGOCALCIFEROL) 1.25 MG (50000 UNIT) PO CAPS: 50000.0000 [IU] | ORAL_CAPSULE | ORAL | 0 refills | Status: DC

## 2017-10-07 NOTE — Progress Notes (Signed)
Office: [phone]  /  Fax: [phone]   HPI:   Chief Complaint: OBESITY Jamie [name] is here to discuss her progress with her obesity treatment plan. She is on the Category 2 plan + 100 calories and is following her eating plan approximately 95 % of the time. She states she is walking for 15-20 minutes 7 times per week. Jamie [name] continues to do well with weight loss. She is very pleased with her weight loss results to date. She states her hunger and cravings are very well controlled.  Her weight is 211 lb (95.7 kg) today and has had a weight loss of 6 pounds over a period of 2 weeks since her last visit. She has lost 11 lbs since starting treatment with Jamie [name].  Vitamin D Deficiency Jamie [name] has a diagnosis of vitamin D deficiency. She is currently taking prescription Vit D and denies nausea, vomiting or muscle weakness.  At risk for osteopenia and osteoporosis Jamie [name] is at higher risk of osteopenia and osteoporosis due to vitamin D deficiency.   Insulin Resistance Jamie [name] has a diagnosis of insulin resistance based on her elevated fasting insulin level >5. Although Jamie [name] blood glucose readings are still under good control, insulin resistance puts her at greater risk of metabolic syndrome and diabetes. She is taking metformin currently and continues to work on diet and exercise to decrease risk of diabetes. She denies polyphagia.  ALLERGIES: Allergies  Allergen Reactions  . Buprenorphine Hcl Rash and Shortness Of Breath  . Morphine And Related Shortness Of Breath and Rash  . Morphine And Related Shortness Of Breath  . Aspirin Other (See Comments)    REACTION: ulcers  Can take 81 mg  . Avelox [Moxifloxacin Hcl In Nacl] Hives  . Diflucan [Fluconazole] Hives    Whelps and rash c skin peeling  . Diflucan [Fluconazole] Hives  . Latex Rash  . Moxifloxacin Palpitations    Tachycardia     MEDICATIONS: Current Outpatient Medications on File Prior to Visit  Medication Sig Dispense Refill  .  aspirin EC 81 MG tablet Take 1 tablet (81 mg total) by mouth daily. 90 tablet 3  . BIOTIN PO Take 1 capsule by mouth at bedtime.     . clidinium-chlordiazePOXIDE (LIBRAX) 5-2.5 MG capsule Take 1 capsule by mouth 2 (two) times daily. 180 capsule 3  . clonazePAM (KLONOPIN) 1 MG tablet Take 1 mg by mouth at bedtime.     . diphenhydrAMINE (BENADRYL) 25 MG tablet Take 25 mg by mouth every 8 (eight) hours as needed for allergies.     Marland Kitchen esomeprazole (NEXIUM) 40 MG capsule TAKE 1 CAPSULE TWICE A DAY BEFORE MEALS 180 capsule 3  . estrogens, conjugated, (PREMARIN) 0.625 MG tablet Take 0.625 mg by mouth every other day. Take daily for 21 days then do not take for 7 days.    . fluticasone (FLONASE) 50 MCG/ACT nasal spray Place 2 sprays into the nose daily as needed for rhinitis or allergies.    Marland Kitchen linaclotide (LINZESS) 145 MCG CAPS capsule Take 1 capsule (145 mcg total) by mouth daily. 90 capsule 3  . losartan-hydrochlorothiazide (HYZAAR) 100-25 MG tablet TK 1 T PO QD  0  . metaxalone (SKELAXIN) 800 MG tablet Take 400 mg by mouth at bedtime.     . metFORMIN (GLUCOPHAGE) 500 MG tablet Take 1 tablet (500 mg total) by mouth daily with breakfast. 30 tablet 0  . metoprolol succinate (TOPROL-XL) 100 MG 24 hr tablet Take 100 mg by mouth every evening. Take with or  immediately following a meal.    . MILK THISTLE PO Take 1 capsule by mouth at bedtime.     . Multiple Vitamin (MULTIVITAMIN WITH MINERALS) TABS Take 1 tablet by mouth every evening.     . Potassium 99 MG TABS Take 1 tablet by mouth daily.    . Probiotic Product (PROBIOTIC-10 PO) Take 1 capsule by mouth daily.    Marland Kitchen spironolactone (ALDACTONE) 50 MG tablet Take 50 mg by mouth every morning.     No current facility-administered medications on file prior to visit.     PAST MEDICAL HISTORY: Past Medical History:  Diagnosis Date  . Allergic rhinitis   . Anxiety   . Arthritis    left foot  . Asthma   . Back pain   . Colon polyps    adenomatous  .  Constipation   . Diverticulosis   . Dyspnea   . Dysrhythmia 1999   Dr Johnsie Cancel 2003  . Elevated LFTs   . Fatty liver disease, nonalcoholic   . GERD (gastroesophageal reflux disease)   . Headache(784.0)    migraines with vision changes but no pain  . Heart murmur   . Hypertension   . IBS (irritable bowel syndrome)   . Joint pain   . Migraines   . Muscle cramps   . Obesity   . Palpitations   . Pneumonia 2007  . Renal cell carcinoma ([place]) 2013  . Restless leg syndrome   . Sleep apnea    moderate; wears CPAP, uncertain about setting  . Stroke [facility])    transient ischemic attack  . TIA (transient ischemic attack) 09/11/2014  . Ulcer   . Urinary incontinence   . Vitamin D deficiency     PAST SURGICAL HISTORY: Past Surgical History:  Procedure Laterality Date  . BREAST REDUCTION SURGERY  Jan 1991  . ELBOW SURGERY Left   . KIDNEY SURGERY Right October 2013    Tumor removed   . TONSILLECTOMY    . VAGINAL HYSTERECTOMY  1979   partial    SOCIAL HISTORY: Social History   Tobacco Use  . Smoking status: Never Smoker  . Smokeless tobacco: Never Used  Substance Use Topics  . Alcohol use: Yes    Alcohol/week: 1.2 - 1.8 oz    Types: 2 - 3 Glasses of wine per week    Comment: 3-4 times a week  . Drug use: No    FAMILY HISTORY: Family History  Problem Relation Age of Onset  . Heart disease Father 48  . Lung cancer Father        smoker  . Hypertension Mother   . Anxiety disorder Mother   . Heart disease Brother   . Hypertension Other   . Colon cancer Cousin   . Allergies Daughter   . Allergies Brother   . Melanoma Daughter   . Liver disease Neg Hx   . Asthma Neg Hx   . Esophageal cancer Neg Hx   . Rectal cancer Neg Hx   . Stomach cancer Neg Hx     ROS: Review of Systems  Constitutional: Positive for weight loss.  Gastrointestinal: Negative for nausea and vomiting.  Musculoskeletal:       Negative muscle weakness  Endo/Heme/Allergies:       Negative  polyphagia    PHYSICAL EXAM: Blood pressure 108/63, pulse 69, temperature 97.8 F (36.6 C), temperature source Oral, height 5' 3"  (1.6 m), weight 211 lb (95.7 kg), SpO2 92 %. Body mass index is  37.38 kg/m. Physical Exam  Constitutional: She is oriented to person, place, and time. She appears well-developed and well-nourished.  Cardiovascular: Normal rate.  Pulmonary/Chest: Effort normal.  Musculoskeletal: Normal range of motion.  Neurological: She is oriented to person, place, and time.  Skin: Skin is warm and dry.  Psychiatric: She has a normal mood and affect. Her behavior is normal.  Vitals reviewed.   RECENT LABS AND TESTS: BMET    Component Value Date/Time   NA 136 08/25/2017 1047   K 4.8 08/25/2017 1047   CL 97 08/25/2017 1047   CO2 23 08/25/2017 1047   GLUCOSE 105 (H) 08/25/2017 1047   GLUCOSE 93 11/22/2016 0457   BUN 15 08/25/2017 1047   CREATININE 0.94 08/25/2017 1047   CALCIUM 10.1 08/25/2017 1047   GFRNONAA 62 08/25/2017 1047   GFRAA 72 08/25/2017 1047   Lab Results  Component Value Date   HGBA1C 5.4 08/25/2017   HGBA1C 5.4 12/16/2011   HGBA1C 5.7 05/03/2011   HGBA1C 5.5 02/15/2010   HGBA1C 5.5 01/13/2009   Lab Results  Component Value Date   INSULIN 50.3 (H) 08/25/2017   CBC    Component Value Date/Time   WBC 6.2 08/25/2017 1047   WBC 5.3 11/22/2016 0457   RBC 3.99 08/25/2017 1047   RBC 3.38 (L) 11/22/2016 0457   HGB 12.7 08/25/2017 1047   HCT 37.0 08/25/2017 1047   PLT 168 11/22/2016 0457   MCV 93 08/25/2017 1047   MCH 31.8 08/25/2017 1047   MCH 31.7 11/22/2016 0457   MCHC 34.3 08/25/2017 1047   MCHC 33.5 11/22/2016 0457   RDW 13.2 08/25/2017 1047   LYMPHSABS 1.2 08/25/2017 1047   MONOABS 0.9 11/20/2016 2004   EOSABS 0.1 08/25/2017 1047   BASOSABS 0.0 08/25/2017 1047   Iron/TIBC/Ferritin/ %Sat No results found for: IRON, TIBC, FERRITIN, IRONPCTSAT Lipid Panel     Component Value Date/Time   CHOL 176 08/25/2017 1047   TRIG 91  08/25/2017 1047   HDL 84 08/25/2017 1047   CHOLHDL 3 08/24/2010 1134   VLDL 17.0 08/24/2010 1134   LDLCALC 74 08/25/2017 1047   Hepatic Function Panel     Component Value Date/Time   PROT 7.0 08/25/2017 1047   ALBUMIN 4.6 08/25/2017 1047   AST 35 08/25/2017 1047   ALT 73 (H) 08/25/2017 1047   ALKPHOS 73 08/25/2017 1047   BILITOT 0.3 08/25/2017 1047   BILIDIR 0.2 08/24/2010 1134      Component Value Date/Time   TSH 1.900 08/25/2017 1047   TSH 1.12 05/03/2011 1239   TSH 1.34 08/24/2010 1134  Results for Jamie [name], Jamie D "[name]" (MRN [id number]) as of 10/07/2017 13:37  Ref. Range 08/25/2017 10:47  Vitamin D, 25-Hydroxy Latest Ref Range: 30.0 - 100.0 ng/mL 38.3    ASSESSMENT AND PLAN: Vitamin D deficiency - Plan: Vitamin D, Ergocalciferol, (DRISDOL) 50000 units CAPS capsule  Insulin resistance  At risk for osteoporosis  Class 2 severe obesity with serious comorbidity and body mass index (BMI) of 37.0 to 37.9 in adult, unspecified obesity type ([place])  PLAN:  Vitamin D Deficiency Jamie [name] was informed that low vitamin D levels contributes to fatigue and are associated with obesity, breast, and colon cancer. Jamie [name] agrees to continue taking prescription Vit D @50 ,000 IU every week #4 and we will refill for 1 month. She will follow up for routine testing of vitamin D, at least 2-3 times per year. She was informed of the risk of over-replacement of vitamin D and agrees to  not increase her dose unless she discusses this with Jamie [name] first. Jamie [name] agrees to follow up with our clinic in 2 weeks.  At risk for osteopenia and osteoporosis Jamie [name] is at risk for osteopenia and osteoporsis due to her vitamin D deficiency. She was encouraged to take her vitamin D and follow her higher calcium diet and increase strengthening exercise to help strengthen her bones and decrease her risk of osteopenia and osteoporosis.  Insulin Resistance Jamie [name] will continue to work on weight loss, exercise, and decreasing  simple carbohydrates in her diet to help decrease the risk of diabetes. We dicussed metformin including benefits and risks. She was informed that eating too many simple carbohydrates or too many calories at one sitting increases the likelihood of GI side effects. Jamie [name] agrees to continue taking her medications as prescribed. Jamie [name] agrees to follow up with our clinic in 2 weeks as directed to monitor her progress.  Obesity Jamie [name] is currently in the action stage of change. As such, her goal is to continue with weight loss efforts She has agreed to follow the Category 2 plan + 100 calories Jamie [name] has been instructed to work up to a goal of 150 minutes of combined cardio and strengthening exercise per week for weight loss and overall health benefits. We discussed the following Behavioral Modification Strategies today: increasing lean protein intake and work on meal planning and easy cooking plans   Jamie [name] has agreed to follow up with our clinic in 2 weeks. She was informed of the importance of frequent follow up visits to maximize her success with intensive lifestyle modifications for her multiple health conditions.   OBESITY BEHAVIORAL INTERVENTION VISIT  Today's visit was # 4 out of 22.  Starting weight: 222 lbs Starting date: 08/25/17 Today's weight : 211 lbs  Today's date: 10/07/2017 Total lbs lost to date: 11 (Patients must lose 7 lbs in the first 6 months to continue with counseling)   ASK: We discussed the diagnosis of obesity with Jamie [name] today and Jamie [name] agreed to give Jamie [name] permission to discuss obesity behavioral modification therapy today.  ASSESS: Jamie [name] has the diagnosis of obesity and her BMI today is 37.39 Jamie [name] is in the action stage of change   ADVISE: Dawnell was educated on the multiple health risks of obesity as well as the benefit of weight loss to improve her health. She was advised of the need for long term treatment and the importance of lifestyle  modifications.  AGREE: Multiple dietary modification options and treatment options were discussed and  Jadea agreed to the above obesity treatment plan.   Wilhemena Durie, am acting as transcriptionist for Lacy Duverney, PA-C I, Lacy Duverney [facility], have reviewed this note and agree with its content

## 2017-10-21 ENCOUNTER — Ambulatory Visit (INDEPENDENT_AMBULATORY_CARE_PROVIDER_SITE_OTHER): Payer: Medicare Other | Admitting: Physician Assistant

## 2017-10-21 VITALS — BP 110/67 | HR 63 | Temp 98.1°F | Ht 63.0 in | Wt 206.0 lb

## 2017-10-21 DIAGNOSIS — E8881 Metabolic syndrome: Secondary | ICD-10-CM | POA: Diagnosis not present

## 2017-10-21 DIAGNOSIS — E559 Vitamin D deficiency, unspecified: Secondary | ICD-10-CM

## 2017-10-21 DIAGNOSIS — Z6836 Body mass index (BMI) 36.0-36.9, adult: Secondary | ICD-10-CM

## 2017-10-21 MED ORDER — VITAMIN D (ERGOCALCIFEROL) 1.25 MG (50000 UNIT) PO CAPS: 50000.0000 [IU] | ORAL_CAPSULE | ORAL | 0 refills | Status: DC

## 2017-10-21 NOTE — Progress Notes (Addendum)
Office: [phone]  /  Fax: [phone]   HPI:   Chief Complaint: OBESITY Jamie [name] [name] here to discuss her progress with her obesity treatment plan. She [name] on the Category 2 plan + 100 calories and [name] following her eating plan approximately 95 % of the time. She states she [name] walking for 15 minutes 7 times per week. Jamie [name] continues to do well with weight loss. She has challenges eating all the protein on the meal plan.  Her weight [name] 206 lb (93.4 kg) today and has had a weight loss of 5 pounds over a period of 2 weeks since her last visit. She has lost 16 lbs since starting treatment with [name].  Vitamin D Deficiency Jamie [name] has a diagnosis of vitamin D deficiency. She [name] currently taking prescription Vit D and denies nausea, vomiting or muscle weakness.   Insulin Resistance Jamie [name] has a diagnosis of insulin resistance based on her elevated fasting insulin level >5. Although Jamie [name] blood glucose readings are still under good control, insulin resistance puts her at greater risk of metabolic syndrome and diabetes. She [name] on metformin currently and continues to work on diet and exercise to decrease risk of diabetes. She denies polyphagia.  ALLERGIES: Allergies  Allergen Reactions  . Buprenorphine Hcl Rash and Shortness Of Breath  . Morphine And Related Shortness Of Breath and Rash  . Morphine And Related Shortness Of Breath  . Aspirin Other (See Comments)    REACTION: ulcers  Can take 81 mg  . Avelox [Moxifloxacin Hcl In Nacl] Hives  . Diflucan [Fluconazole] Hives    Whelps and rash c skin peeling  . Diflucan [Fluconazole] Hives  . Latex Rash  . Moxifloxacin Palpitations    Tachycardia     MEDICATIONS: Current Outpatient Medications on File Prior to Visit  Medication Sig Dispense Refill  . aspirin EC 81 MG tablet Take 1 tablet (81 mg total) by mouth daily. 90 tablet 3  . BIOTIN PO Take 1 capsule by mouth at bedtime.     . clidinium-chlordiazePOXIDE (LIBRAX) 5-2.5 MG capsule Take  1 capsule by mouth 2 (two) times daily. 180 capsule 3  . clonazePAM (KLONOPIN) 1 MG tablet Take 1 mg by mouth at bedtime.     . diphenhydrAMINE (BENADRYL) 25 MG tablet Take 25 mg by mouth every 8 (eight) hours as needed for allergies.     Marland Kitchen esomeprazole (NEXIUM) 40 MG capsule TAKE 1 CAPSULE TWICE A DAY BEFORE MEALS 180 capsule 3  . estrogens, conjugated, (PREMARIN) 0.625 MG tablet Take 0.625 mg by mouth every other day. Take daily for 21 days then do not take for 7 days.    . fluticasone (FLONASE) 50 MCG/ACT nasal spray Place 2 sprays into the nose daily as needed for rhinitis or allergies.    Marland Kitchen linaclotide (LINZESS) 145 MCG CAPS capsule Take 1 capsule (145 mcg total) by mouth daily. 90 capsule 3  . losartan-hydrochlorothiazide (HYZAAR) 100-25 MG tablet TK 1 T PO QD  0  . metaxalone (SKELAXIN) 800 MG tablet Take 400 mg by mouth at bedtime.     . metFORMIN (GLUCOPHAGE) 500 MG tablet Take 1 tablet (500 mg total) by mouth daily with breakfast. 30 tablet 0  . metoprolol succinate (TOPROL-XL) 100 MG 24 hr tablet Take 100 mg by mouth every evening. Take with or immediately following a meal.    . MILK THISTLE PO Take 1 capsule by mouth at bedtime.     . Multiple Vitamin (MULTIVITAMIN WITH MINERALS) TABS Take 1  tablet by mouth every evening.     . Potassium 99 MG TABS Take 1 tablet by mouth daily.    . Probiotic Product (PROBIOTIC-10 PO) Take 1 capsule by mouth daily.    Marland Kitchen spironolactone (ALDACTONE) 50 MG tablet Take 50 mg by mouth every morning.     No current facility-administered medications on file prior to visit.     PAST MEDICAL HISTORY: Past Medical History:  Diagnosis Date  . Allergic rhinitis   . Anxiety   . Arthritis    left foot  . Asthma   . Back pain   . Colon polyps    adenomatous  . Constipation   . Diverticulosis   . Dyspnea   . Dysrhythmia 1999   Dr Johnsie Cancel 2003  . Elevated LFTs   . Fatty liver disease, nonalcoholic   . GERD (gastroesophageal reflux disease)   .  Headache(784.0)    migraines with vision changes but no pain  . Heart murmur   . Hypertension   . IBS (irritable bowel syndrome)   . Joint pain   . Migraines   . Muscle cramps   . Obesity   . Palpitations   . Pneumonia 2007  . Renal cell carcinoma ([place]) 2013  . Restless leg syndrome   . Sleep apnea    moderate; wears CPAP, uncertain about setting  . Stroke [facility])    transient ischemic attack  . TIA (transient ischemic attack) 09/11/2014  . Ulcer   . Urinary incontinence   . Vitamin D deficiency     PAST SURGICAL HISTORY: Past Surgical History:  Procedure Laterality Date  . BREAST REDUCTION SURGERY  Jan 1991  . ELBOW SURGERY Left   . KIDNEY SURGERY Right October 2013    Tumor removed   . TONSILLECTOMY    . VAGINAL HYSTERECTOMY  1979   partial    SOCIAL HISTORY: Social History   Tobacco Use  . Smoking status: Never Smoker  . Smokeless tobacco: Never Used  Substance Use Topics  . Alcohol use: Yes    Alcohol/week: 1.2 - 1.8 oz    Types: 2 - 3 Glasses of wine per week    Comment: 3-4 times a week  . Drug use: No    FAMILY HISTORY: Family History  Problem Relation Age of Onset  . Heart disease Father 51  . Lung cancer Father        smoker  . Hypertension Mother   . Anxiety disorder Mother   . Heart disease Brother   . Hypertension Other   . Colon cancer Cousin   . Allergies Daughter   . Allergies Brother   . Melanoma Daughter   . Liver disease Neg Hx   . Asthma Neg Hx   . Esophageal cancer Neg Hx   . Rectal cancer Neg Hx   . Stomach cancer Neg Hx     ROS: Review of Systems  Constitutional: Positive for weight loss.  Gastrointestinal: Negative for nausea and vomiting.  Musculoskeletal:       Negative muscle weakness  Endo/Heme/Allergies:       Negative polyphagia    PHYSICAL EXAM: Blood pressure 110/67, pulse 63, temperature 98.1 F (36.7 C), temperature source Oral, height 5' 3"  (1.6 m), weight 206 lb (93.4 kg), SpO2 96 %. Body mass index [name]  36.49 kg/m. Physical Exam  Constitutional: She [name] oriented to person, place, and time. She appears well-developed and well-nourished.  Cardiovascular: Normal rate.  Pulmonary/Chest: Effort normal.  Musculoskeletal: Normal range  of motion.  Neurological: She [name] oriented to person, place, and time.  Skin: Skin [name] warm and dry.  Psychiatric: She has a normal mood and affect. Her behavior [name] normal.  Vitals reviewed.   RECENT LABS AND TESTS: BMET    Component Value Date/Time   NA 136 08/25/2017 1047   K 4.8 08/25/2017 1047   CL 97 08/25/2017 1047   CO2 23 08/25/2017 1047   GLUCOSE 105 (H) 08/25/2017 1047   GLUCOSE 93 11/22/2016 0457   BUN 15 08/25/2017 1047   CREATININE 0.94 08/25/2017 1047   CALCIUM 10.1 08/25/2017 1047   GFRNONAA 62 08/25/2017 1047   GFRAA 72 08/25/2017 1047   Lab Results  Component Value Date   HGBA1C 5.4 08/25/2017   HGBA1C 5.4 12/16/2011   HGBA1C 5.7 05/03/2011   HGBA1C 5.5 02/15/2010   HGBA1C 5.5 01/13/2009   Lab Results  Component Value Date   INSULIN 50.3 (H) 08/25/2017   CBC    Component Value Date/Time   WBC 6.2 08/25/2017 1047   WBC 5.3 11/22/2016 0457   RBC 3.99 08/25/2017 1047   RBC 3.38 (L) 11/22/2016 0457   HGB 12.7 08/25/2017 1047   HCT 37.0 08/25/2017 1047   PLT 168 11/22/2016 0457   MCV 93 08/25/2017 1047   MCH 31.8 08/25/2017 1047   MCH 31.7 11/22/2016 0457   MCHC 34.3 08/25/2017 1047   MCHC 33.5 11/22/2016 0457   RDW 13.2 08/25/2017 1047   LYMPHSABS 1.2 08/25/2017 1047   MONOABS 0.9 11/20/2016 2004   EOSABS 0.1 08/25/2017 1047   BASOSABS 0.0 08/25/2017 1047   Iron/TIBC/Ferritin/ %Sat No results found for: IRON, TIBC, FERRITIN, IRONPCTSAT Lipid Panel     Component Value Date/Time   CHOL 176 08/25/2017 1047   TRIG 91 08/25/2017 1047   HDL 84 08/25/2017 1047   CHOLHDL 3 08/24/2010 1134   VLDL 17.0 08/24/2010 1134   LDLCALC 74 08/25/2017 1047   Hepatic Function Panel     Component Value Date/Time   PROT 7.0  08/25/2017 1047   ALBUMIN 4.6 08/25/2017 1047   AST 35 08/25/2017 1047   ALT 73 (H) 08/25/2017 1047   ALKPHOS 73 08/25/2017 1047   BILITOT 0.3 08/25/2017 1047   BILIDIR 0.2 08/24/2010 1134      Component Value Date/Time   TSH 1.900 08/25/2017 1047   TSH 1.12 05/03/2011 1239   TSH 1.34 08/24/2010 1134  Results for Edell, Lakayla D "[name]" (MRN [id number]) as of 10/21/2017 15:47  Ref. Range 08/25/2017 10:47  Vitamin D, 25-Hydroxy Latest Ref Range: 30.0 - 100.0 ng/mL 38.3    ASSESSMENT AND PLAN: Vitamin D deficiency - Plan: Vitamin D, Ergocalciferol, (DRISDOL) 50000 units CAPS capsule  Insulin resistance  At risk for osteoporosis  Class 2 severe obesity with serious comorbidity and body mass index (BMI) of 36.0 to 36.9 in adult, unspecified obesity type ([place])  PLAN:  Vitamin D Deficiency Jamie [name] was informed that low vitamin D levels contributes to fatigue and are associated with obesity, breast, and colon cancer. Jamie [name] agrees to continue taking prescription Vit D @50 ,000 IU every week #4 and we will refill for 1 month. She will follow up for routine testing of vitamin D, at least 2-3 times per year. She was informed of the risk of over-replacement of vitamin D and agrees to not increase her dose unless she discusses this with [name] first. Jamie [name] agrees to follow up with our clinic in 2 weeks.   Insulin Resistance Jamie [name] will continue to work  on weight loss, exercise, and decreasing simple carbohydrates in her diet to help decrease the risk of diabetes. We dicussed metformin including benefits and risks. She was informed that eating too many simple carbohydrates or too many calories at one sitting increases the likelihood of GI side effects. Jamie [name] agrees to continue taking metformin and she agrees to follow up with our clinic in 2 weeks as directed to monitor her progress.  Obesity Jamie [name] currently in the action stage of change. As such, her goal [name] to continue with weight loss  efforts She has agreed to follow the Category 2 plan + 100 calories Jamie [name] has been instructed to work up to a goal of 150 minutes of combined cardio and strengthening exercise per week for weight loss and overall health benefits. We discussed the following Behavioral Modification Strategies today: increasing lean protein intake and work on meal planning and easy cooking plans   Jamie [name] has agreed to follow up with our clinic in 2 weeks. She was informed of the importance of frequent follow up visits to maximize her success with intensive lifestyle modifications for her multiple health conditions.   OBESITY BEHAVIORAL INTERVENTION VISIT  Today's visit was # 5 out of 22.  Starting weight: 222 lbs Starting date: 08/25/17 Today's weight : 206 lbs Today's date: 10/21/2017 Total lbs lost to date: 16 (Patients must lose 7 lbs in the first 6 months to continue with counseling)   ASK: We discussed the diagnosis of obesity with Jamie [name] today and Jamie [name] agreed to give [name] permission to discuss obesity behavioral modification therapy today.  ASSESS: Jamie [name] has the diagnosis of obesity and her BMI today [name] 36.5 Jamie [name] [name] in the action stage of change   ADVISE: Jamie [name] was educated on the multiple health risks of obesity as well as the benefit of weight loss to improve her health. She was advised of the need for long term treatment and the importance of lifestyle modifications.  AGREE: Multiple dietary modification options and treatment options were discussed and  Jamie [name] agreed to the above obesity treatment plan.   Jamie [name], am acting as transcriptionist for Jamie Duverney, PA-C I, Jamie [name] [facility], have reviewed this note and agree with its content

## 2017-11-05 ENCOUNTER — Ambulatory Visit (INDEPENDENT_AMBULATORY_CARE_PROVIDER_SITE_OTHER): Payer: Medicare Other | Admitting: Physician Assistant

## 2017-11-05 VITALS — BP 82/54 | HR 63 | Temp 98.0°F | Ht 63.0 in | Wt 204.0 lb

## 2017-11-05 DIAGNOSIS — Z6836 Body mass index (BMI) 36.0-36.9, adult: Secondary | ICD-10-CM | POA: Diagnosis not present

## 2017-11-05 DIAGNOSIS — I10 Essential (primary) hypertension: Secondary | ICD-10-CM | POA: Diagnosis not present

## 2017-11-05 DIAGNOSIS — E8881 Metabolic syndrome: Secondary | ICD-10-CM | POA: Diagnosis not present

## 2017-11-05 DIAGNOSIS — E66812 Obesity, class 2: Secondary | ICD-10-CM

## 2017-11-05 DIAGNOSIS — E88819 Insulin resistance, unspecified: Secondary | ICD-10-CM

## 2017-11-05 MED ORDER — METFORMIN HCL 500 MG PO TABS: 500.0000 mg | ORAL_TABLET | Freq: Every day | ORAL | 0 refills | Status: DC

## 2017-11-06 ENCOUNTER — Observation Stay (HOSPITAL_COMMUNITY)
Admission: EM | Admit: 2017-11-06 | Discharge: 2017-11-07 | Disposition: A | Discharge status: Home / Self Care | Payer: Medicare Other | Attending: Internal Medicine | Admitting: Internal Medicine

## 2017-11-06 ENCOUNTER — Other Ambulatory Visit: Payer: Self-pay

## 2017-11-06 ENCOUNTER — Encounter (HOSPITAL_COMMUNITY): Payer: Self-pay

## 2017-11-06 ENCOUNTER — Telehealth (INDEPENDENT_AMBULATORY_CARE_PROVIDER_SITE_OTHER): Payer: Self-pay | Admitting: Physician Assistant

## 2017-11-06 DIAGNOSIS — G4733 Obstructive sleep apnea (adult) (pediatric): Secondary | ICD-10-CM | POA: Insufficient documentation

## 2017-11-06 DIAGNOSIS — E559 Vitamin D deficiency, unspecified: Secondary | ICD-10-CM | POA: Insufficient documentation

## 2017-11-06 DIAGNOSIS — K7581 Nonalcoholic steatohepatitis (NASH): Secondary | ICD-10-CM | POA: Insufficient documentation

## 2017-11-06 DIAGNOSIS — K589 Irritable bowel syndrome without diarrhea: Secondary | ICD-10-CM | POA: Insufficient documentation

## 2017-11-06 DIAGNOSIS — Z905 Acquired absence of kidney: Secondary | ICD-10-CM | POA: Diagnosis not present

## 2017-11-06 DIAGNOSIS — K219 Gastro-esophageal reflux disease without esophagitis: Secondary | ICD-10-CM | POA: Insufficient documentation

## 2017-11-06 DIAGNOSIS — Z7984 Long term (current) use of oral hypoglycemic drugs: Secondary | ICD-10-CM | POA: Insufficient documentation

## 2017-11-06 DIAGNOSIS — F419 Anxiety disorder, unspecified: Secondary | ICD-10-CM | POA: Diagnosis not present

## 2017-11-06 DIAGNOSIS — E871 Hypo-osmolality and hyponatremia: Secondary | ICD-10-CM | POA: Diagnosis not present

## 2017-11-06 DIAGNOSIS — Z885 Allergy status to narcotic agent status: Secondary | ICD-10-CM | POA: Diagnosis not present

## 2017-11-06 DIAGNOSIS — M19072 Primary osteoarthritis, left ankle and foot: Secondary | ICD-10-CM | POA: Insufficient documentation

## 2017-11-06 DIAGNOSIS — Z7982 Long term (current) use of aspirin: Secondary | ICD-10-CM | POA: Insufficient documentation

## 2017-11-06 DIAGNOSIS — R110 Nausea: Secondary | ICD-10-CM | POA: Diagnosis not present

## 2017-11-06 DIAGNOSIS — Z79899 Other long term (current) drug therapy: Secondary | ICD-10-CM | POA: Insufficient documentation

## 2017-11-06 DIAGNOSIS — Z91040 Latex allergy status: Secondary | ICD-10-CM | POA: Diagnosis not present

## 2017-11-06 DIAGNOSIS — Z888 Allergy status to other drugs, medicaments and biological substances status: Secondary | ICD-10-CM | POA: Diagnosis not present

## 2017-11-06 DIAGNOSIS — Z86010 Personal history of colonic polyps: Secondary | ICD-10-CM | POA: Insufficient documentation

## 2017-11-06 DIAGNOSIS — Z6838 Body mass index (BMI) 38.0-38.9, adult: Secondary | ICD-10-CM | POA: Diagnosis not present

## 2017-11-06 DIAGNOSIS — I959 Hypotension, unspecified: Secondary | ICD-10-CM | POA: Diagnosis not present

## 2017-11-06 DIAGNOSIS — I10 Essential (primary) hypertension: Secondary | ICD-10-CM | POA: Diagnosis not present

## 2017-11-06 DIAGNOSIS — I341 Nonrheumatic mitral (valve) prolapse: Secondary | ICD-10-CM | POA: Diagnosis not present

## 2017-11-06 DIAGNOSIS — Z9989 Dependence on other enabling machines and devices: Secondary | ICD-10-CM | POA: Insufficient documentation

## 2017-11-06 DIAGNOSIS — R5383 Other fatigue: Secondary | ICD-10-CM | POA: Diagnosis not present

## 2017-11-06 DIAGNOSIS — R270 Ataxia, unspecified: Secondary | ICD-10-CM | POA: Diagnosis not present

## 2017-11-06 DIAGNOSIS — Z886 Allergy status to analgesic agent status: Secondary | ICD-10-CM | POA: Diagnosis not present

## 2017-11-06 DIAGNOSIS — R011 Cardiac murmur, unspecified: Secondary | ICD-10-CM | POA: Insufficient documentation

## 2017-11-06 DIAGNOSIS — G2581 Restless legs syndrome: Secondary | ICD-10-CM | POA: Insufficient documentation

## 2017-11-06 DIAGNOSIS — Z8249 Family history of ischemic heart disease and other diseases of the circulatory system: Secondary | ICD-10-CM | POA: Diagnosis not present

## 2017-11-06 DIAGNOSIS — J45909 Unspecified asthma, uncomplicated: Secondary | ICD-10-CM | POA: Insufficient documentation

## 2017-11-06 DIAGNOSIS — Z883 Allergy status to other anti-infective agents status: Secondary | ICD-10-CM | POA: Insufficient documentation

## 2017-11-06 DIAGNOSIS — R399 Unspecified symptoms and signs involving the genitourinary system: Secondary | ICD-10-CM | POA: Diagnosis not present

## 2017-11-06 DIAGNOSIS — Z8673 Personal history of transient ischemic attack (TIA), and cerebral infarction without residual deficits: Secondary | ICD-10-CM | POA: Insufficient documentation

## 2017-11-06 DIAGNOSIS — Z85528 Personal history of other malignant neoplasm of kidney: Secondary | ICD-10-CM | POA: Insufficient documentation

## 2017-11-06 LAB — BASIC METABOLIC PANEL
Anion gap: 10 (ref 5–15)
BUN: 34 mg/dL — AB (ref 6–20)
CO2: 20 mmol/L — ABNORMAL LOW (ref 22–32)
Calcium: 9.9 mg/dL (ref 8.9–10.3)
Chloride: 96 mmol/L — ABNORMAL LOW (ref 101–111)
Creatinine, Ser: 1.22 mg/dL — ABNORMAL HIGH (ref 0.44–1.00)
GFR calc Af Amer: 51 mL/min — ABNORMAL LOW (ref >60)
GFR, EST NON AFRICAN AMERICAN: 44 mL/min — AB (ref >60)
GLUCOSE: 88 mg/dL (ref 65–99)
POTASSIUM: 4.5 mmol/L (ref 3.5–5.1)
Sodium: 126 mmol/L — ABNORMAL LOW (ref 135–145)

## 2017-11-06 LAB — URINALYSIS, ROUTINE W REFLEX MICROSCOPIC
Bilirubin Urine: NEGATIVE
GLUCOSE, UA: NEGATIVE mg/dL
HGB URINE DIPSTICK: NEGATIVE
Ketones, ur: 5 mg/dL — AB
Leukocytes, UA: NEGATIVE
Nitrite: NEGATIVE
PROTEIN: NEGATIVE mg/dL
Specific Gravity, Urine: 1.01 (ref 1.005–1.030)
pH: 6 (ref 5.0–8.0)

## 2017-11-06 LAB — CBC WITH DIFFERENTIAL/PLATELET
Basophils Absolute: 0 10*3/uL (ref 0.0–0.1)
Basophils Relative: 0 %
EOS PCT: 1 %
Eosinophils Absolute: 0.1 10*3/uL (ref 0.0–0.7)
HCT: 35.4 % — ABNORMAL LOW (ref 36.0–46.0)
Hemoglobin: 12.3 g/dL (ref 12.0–15.0)
LYMPHS ABS: 1.4 10*3/uL (ref 0.7–4.0)
LYMPHS PCT: 12 %
MCH: 31.9 pg (ref 26.0–34.0)
MCHC: 34.7 g/dL (ref 30.0–36.0)
MCV: 91.9 fL (ref 78.0–100.0)
MONO ABS: 0.6 10*3/uL (ref 0.1–1.0)
MONOS PCT: 5 %
Neutro Abs: 9.7 10*3/uL — ABNORMAL HIGH (ref 1.7–7.7)
Neutrophils Relative %: 82 %
PLATELETS: 274 10*3/uL (ref 150–400)
RBC: 3.85 MIL/uL — AB (ref 3.87–5.11)
RDW: 12.7 % (ref 11.5–15.5)
WBC: 11.8 10*3/uL — AB (ref 4.0–10.5)

## 2017-11-06 LAB — SODIUM, URINE, RANDOM: Sodium, Ur: <10 mmol/L

## 2017-11-06 MED ORDER — ENOXAPARIN SODIUM 40 MG/0.4ML ~~LOC~~ SOLN
40.0000 mg | SUBCUTANEOUS | Status: DC
  Administered 2017-11-06: 40 mg via SUBCUTANEOUS
  Filled 2017-11-06: qty 0.4

## 2017-11-06 MED ORDER — ASPIRIN EC 81 MG PO TBEC
81.0000 mg | DELAYED_RELEASE_TABLET | Freq: Every day | ORAL | Status: DC
  Administered 2017-11-07: 81 mg via ORAL
  Filled 2017-11-06: qty 1

## 2017-11-06 MED ORDER — ACETAMINOPHEN 650 MG RE SUPP: 650.0000 mg | Freq: Four times a day (QID) | RECTAL | Status: DC | PRN

## 2017-11-06 MED ORDER — SODIUM CHLORIDE 0.9 % IV SOLN
INTRAVENOUS | Status: DC
  Administered 2017-11-06: via INTRAVENOUS

## 2017-11-06 MED ORDER — ONDANSETRON HCL 4 MG/2ML IJ SOLN: 4.0000 mg | Freq: Four times a day (QID) | INTRAMUSCULAR | Status: DC | PRN

## 2017-11-06 MED ORDER — CILIDINIUM-CHLORDIAZEPOXIDE 2.5-5 MG PO CAPS
2.0000 | ORAL_CAPSULE | Freq: Every day | ORAL | Status: DC
  Administered 2017-11-06: 2 via ORAL
  Filled 2017-11-06: qty 2

## 2017-11-06 MED ORDER — METAXALONE 400 MG HALF TABLET
400.0000 mg | ORAL_TABLET | Freq: Every day | ORAL | Status: DC
  Administered 2017-11-06: 400 mg via ORAL
  Filled 2017-11-06: qty 1

## 2017-11-06 MED ORDER — DOCUSATE SODIUM 100 MG PO CAPS
100.0000 mg | ORAL_CAPSULE | Freq: Two times a day (BID) | ORAL | Status: DC
  Administered 2017-11-06 – 2017-11-07 (×2): 100 mg via ORAL
  Filled 2017-11-06 (×2): qty 1

## 2017-11-06 MED ORDER — DIPHENHYDRAMINE HCL 25 MG PO CAPS
25.0000 mg | ORAL_CAPSULE | Freq: Every day | ORAL | Status: DC
  Administered 2017-11-06: 25 mg via ORAL
  Filled 2017-11-06: qty 1

## 2017-11-06 MED ORDER — CLONAZEPAM 1 MG PO TABS
1.0000 mg | ORAL_TABLET | Freq: Every day | ORAL | Status: DC
  Administered 2017-11-06: 1 mg via ORAL
  Filled 2017-11-06: qty 1

## 2017-11-06 MED ORDER — SODIUM CHLORIDE 0.9 % IV BOLUS
1000.0000 mL | Freq: Once | INTRAVENOUS | Status: AC
  Administered 2017-11-06: 1000 mL via INTRAVENOUS

## 2017-11-06 MED ORDER — PANTOPRAZOLE SODIUM 40 MG PO TBEC
40.0000 mg | DELAYED_RELEASE_TABLET | Freq: Every day | ORAL | Status: DC
  Administered 2017-11-07: 40 mg via ORAL
  Filled 2017-11-06: qty 1

## 2017-11-06 MED ORDER — METOPROLOL SUCCINATE ER 100 MG PO TB24: 100.0000 mg | ORAL_TABLET | Freq: Every evening | ORAL | 0 refills | Status: DC

## 2017-11-06 MED ORDER — ACETAMINOPHEN 325 MG PO TABS: 650.0000 mg | ORAL_TABLET | Freq: Four times a day (QID) | ORAL | Status: DC | PRN

## 2017-11-06 MED ORDER — ONDANSETRON HCL 4 MG PO TABS: 4.0000 mg | ORAL_TABLET | Freq: Four times a day (QID) | ORAL | Status: DC | PRN

## 2017-11-06 MED ORDER — SODIUM CHLORIDE 0.9 % IV BOLUS
1000.0000 mL | Freq: Once | INTRAVENOUS | Status: AC
Start: 2017-11-06 — End: 2017-11-06
  Administered 2017-11-06: 1000 mL via INTRAVENOUS

## 2017-11-06 NOTE — Progress Notes (Signed)
Office: [phone]  /  Fax: [phone]   HPI:   Chief Complaint: OBESITY Jamie [name] is here to discuss her progress with her obesity treatment plan. She is on the Category 2 plan + 100 calories and is following her eating plan approximately 90 % of the time. She states she is walking for 10 minutes 4-5 times per week. Jamie [name] continues to do well with weight loss. She states she has challenges getting all the recommended protein in. Also, she admits to feeling more tired recently.  Her weight is 204 lb (92.5 kg) today and has had a weight loss of 2 pounds over a period of 2 weeks since her last visit. She has lost 18 lbs since starting treatment with [name].  Insulin Resistance Jamie [name] has a diagnosis of insulin resistance based on her elevated fasting insulin level >5. Although Jamie [name]'s blood glucose readings are still under good control, insulin resistance puts her at greater risk of metabolic syndrome and diabetes. She is taking metformin currently and continues to work on diet and exercise to decrease risk of diabetes. She denies polyphagia.  Hypertension Jamie [name] is a 72 y.o. female with hypertension. Jamie [name]'s blood pressure is low. She admits to feeling excessive fatigue, likely due to low blood pressure. She has not taken blood pressure at home. She is working weight loss to help control her blood pressure with the goal of decreasing her risk of heart attack and stroke. Jamie [name]'s blood pressure is not currently controlled.  ALLERGIES: Allergies  Allergen Reactions  . Buprenorphine Hcl Rash and Shortness Of Breath  . Morphine And Related Shortness Of Breath and Rash  . Morphine And Related Shortness Of Breath  . Aspirin Other (See Comments)    REACTION: ulcers  Can take 81 mg  . Avelox [Moxifloxacin Hcl In Nacl] Hives  . Diflucan [Fluconazole] Hives    Whelps and rash c skin peeling  . Diflucan [Fluconazole] Hives  . Latex Rash  . Moxifloxacin Palpitations    Tachycardia      MEDICATIONS: Current Outpatient Medications on File Prior to Visit  Medication Sig Dispense Refill  . aspirin EC 81 MG tablet Take 1 tablet (81 mg total) by mouth daily. 90 tablet 3  . BIOTIN PO Take 1 capsule by mouth at bedtime.     . clidinium-chlordiazePOXIDE (LIBRAX) 5-2.5 MG capsule Take 1 capsule by mouth 2 (two) times daily. 180 capsule 3  . clonazePAM (KLONOPIN) 1 MG tablet Take 1 mg by mouth at bedtime.     . conjugated estrogens (PREMARIN) vaginal cream Place 1 Applicatorful vaginally daily.    . diphenhydrAMINE (BENADRYL) 25 MG tablet Take 25 mg by mouth every 8 (eight) hours as needed for allergies.     Marland Kitchen esomeprazole (NEXIUM) 40 MG capsule TAKE 1 CAPSULE TWICE A DAY BEFORE MEALS 180 capsule 3  . fluticasone (FLONASE) 50 MCG/ACT nasal spray Place 2 sprays into the nose daily as needed for rhinitis or allergies.    Marland Kitchen linaclotide (LINZESS) 145 MCG CAPS capsule Take 1 capsule (145 mcg total) by mouth daily. 90 capsule 3  . metaxalone (SKELAXIN) 800 MG tablet Take 400 mg by mouth at bedtime.     . metoprolol succinate (TOPROL-XL) 100 MG 24 hr tablet Take 100 mg by mouth every evening. Take with or immediately following a meal.    . MILK THISTLE PO Take 1 capsule by mouth at bedtime.     . Multiple Vitamin (MULTIVITAMIN WITH MINERALS) TABS Take 1 tablet by mouth  every evening.     . Potassium 99 MG TABS Take 1 tablet by mouth daily.    . Probiotic Product (PROBIOTIC-10 PO) Take 1 capsule by mouth daily.    Marland Kitchen spironolactone (ALDACTONE) 50 MG tablet Take 50 mg by mouth every morning.    . Vitamin D, Ergocalciferol, (DRISDOL) 50000 units CAPS capsule Take 1 capsule (50,000 Units total) by mouth every 7 (seven) days. 4 capsule 0   No current facility-administered medications on file prior to visit.     PAST MEDICAL HISTORY: Past Medical History:  Diagnosis Date  . Allergic rhinitis   . Anxiety   . Arthritis    left foot  . Asthma   . Back pain   . Colon polyps     adenomatous  . Constipation   . Diverticulosis   . Dyspnea   . Dysrhythmia 1999   Dr Johnsie Cancel 2003  . Elevated LFTs   . Fatty liver disease, nonalcoholic   . GERD (gastroesophageal reflux disease)   . Headache(784.0)    migraines with vision changes but no pain  . Heart murmur   . Hypertension   . IBS (irritable bowel syndrome)   . Joint pain   . Migraines   . Muscle cramps   . Obesity   . Palpitations   . Pneumonia 2007  . Renal cell carcinoma ([place]) 2013  . Restless leg syndrome   . Sleep apnea    moderate; wears CPAP, uncertain about setting  . Stroke [facility])    transient ischemic attack  . TIA (transient ischemic attack) 09/11/2014  . Ulcer   . Urinary incontinence   . Vitamin D deficiency     PAST SURGICAL HISTORY: Past Surgical History:  Procedure Laterality Date  . BREAST REDUCTION SURGERY  Jan 1991  . ELBOW SURGERY Left   . KIDNEY SURGERY Right October 2013    Tumor removed   . TONSILLECTOMY    . VAGINAL HYSTERECTOMY  1979   partial    SOCIAL HISTORY: Social History   Tobacco Use  . Smoking status: Never Smoker  . Smokeless tobacco: Never Used  Substance Use Topics  . Alcohol use: Yes    Alcohol/week: 1.2 - 1.8 oz    Types: 2 - 3 Glasses of wine per week    Comment: 3-4 times a week  . Drug use: No    FAMILY HISTORY: Family History  Problem Relation Age of Onset  . Heart disease Father 22  . Lung cancer Father        smoker  . Hypertension Mother   . Anxiety disorder Mother   . Heart disease Brother   . Hypertension Other   . Colon cancer Cousin   . Allergies Daughter   . Allergies Brother   . Melanoma Daughter   . Liver disease Neg Hx   . Asthma Neg Hx   . Esophageal cancer Neg Hx   . Rectal cancer Neg Hx   . Stomach cancer Neg Hx     ROS: Review of Systems  Constitutional: Positive for malaise/fatigue and weight loss.  Endo/Heme/Allergies:       Negative polyphagia    PHYSICAL EXAM: Blood pressure (!) 82/54, pulse 63,  temperature 98 F (36.7 C), temperature source Oral, height 5' 3"  (1.6 m), weight 204 lb (92.5 kg), SpO2 97 %. Body mass index is 36.14 kg/m. Physical Exam  Constitutional: She is oriented to person, place, and time. She appears well-developed and well-nourished.  Cardiovascular: Normal rate.  Pulmonary/Chest: Effort normal.  Musculoskeletal: Normal range of motion.  Neurological: She is oriented to person, place, and time.  Skin: Skin is warm and dry.  Psychiatric: She has a normal mood and affect. Her behavior is normal.  Vitals reviewed.   RECENT LABS AND TESTS: BMET    Component Value Date/Time   NA 136 08/25/2017 1047   K 4.8 08/25/2017 1047   CL 97 08/25/2017 1047   CO2 23 08/25/2017 1047   GLUCOSE 105 (H) 08/25/2017 1047   GLUCOSE 93 11/22/2016 0457   BUN 15 08/25/2017 1047   CREATININE 0.94 08/25/2017 1047   CALCIUM 10.1 08/25/2017 1047   GFRNONAA 62 08/25/2017 1047   GFRAA 72 08/25/2017 1047   Lab Results  Component Value Date   HGBA1C 5.4 08/25/2017   HGBA1C 5.4 12/16/2011   HGBA1C 5.7 05/03/2011   HGBA1C 5.5 02/15/2010   HGBA1C 5.5 01/13/2009   Lab Results  Component Value Date   INSULIN 50.3 (H) 08/25/2017   CBC    Component Value Date/Time   WBC 6.2 08/25/2017 1047   WBC 5.3 11/22/2016 0457   RBC 3.99 08/25/2017 1047   RBC 3.38 (L) 11/22/2016 0457   HGB 12.7 08/25/2017 1047   HCT 37.0 08/25/2017 1047   PLT 168 11/22/2016 0457   MCV 93 08/25/2017 1047   MCH 31.8 08/25/2017 1047   MCH 31.7 11/22/2016 0457   MCHC 34.3 08/25/2017 1047   MCHC 33.5 11/22/2016 0457   RDW 13.2 08/25/2017 1047   LYMPHSABS 1.2 08/25/2017 1047   MONOABS 0.9 11/20/2016 2004   EOSABS 0.1 08/25/2017 1047   BASOSABS 0.0 08/25/2017 1047   Iron/TIBC/Ferritin/ %Sat No results found for: IRON, TIBC, FERRITIN, IRONPCTSAT Lipid Panel     Component Value Date/Time   CHOL 176 08/25/2017 1047   TRIG 91 08/25/2017 1047   HDL 84 08/25/2017 1047   CHOLHDL 3 08/24/2010 1134    VLDL 17.0 08/24/2010 1134   LDLCALC 74 08/25/2017 1047   Hepatic Function Panel     Component Value Date/Time   PROT 7.0 08/25/2017 1047   ALBUMIN 4.6 08/25/2017 1047   AST 35 08/25/2017 1047   ALT 73 (H) 08/25/2017 1047   ALKPHOS 73 08/25/2017 1047   BILITOT 0.3 08/25/2017 1047   BILIDIR 0.2 08/24/2010 1134      Component Value Date/Time   TSH 1.900 08/25/2017 1047   TSH 1.12 05/03/2011 1239   TSH 1.34 08/24/2010 1134    ASSESSMENT AND PLAN: Insulin resistance - Plan: metFORMIN (GLUCOPHAGE) 500 MG tablet  Essential hypertension  Class 2 severe obesity with serious comorbidity and body mass index (BMI) of 36.0 to 36.9 in adult, unspecified obesity type ([facility])  PLAN:  Insulin Resistance Jamie [name] will continue to work on weight loss, exercise, and decreasing simple carbohydrates in her diet to help decrease the risk of diabetes. We dicussed metformin including benefits and risks. She was informed that eating too many simple carbohydrates or too many calories at one sitting increases the likelihood of GI side effects. Jamie [name] agrees to continue taking metformin 500 mg q AM #30 and we will refill for 1 month. Jamie [name] agrees to follow up with our clinic in 2 weeks as directed to monitor her progress.  Hypertension We discussed sodium restriction, working on healthy weight loss, and a regular exercise program as the means to achieve improved blood pressure control. Jamie [name] agreed with this plan and agreed to follow up as directed. We will continue to monitor her blood pressure as  well as her progress with the above lifestyle modifications. Jamie [name] agrees to stop losartan-hydrochlorothiazide. She is to decrease metoprolol to 100 mg and continue taking spironolactone 50 mg. She is to take a BP measurement at least 3 times daily is instructed if systolic BP is <076 or diastolic is <22 to not take any of her BP for the day. She is to follow up with her PCP for further management and adjustment of her BP  medications and workup of fatigue. If current symptoms worsen or if she develops any new symptoms, she is to go to the ED. She will watch for signs of hypotension as she continues her lifestyle modifications. Jamie [name] agrees to follow up with our clinic in 2 weeks.  Obesity Jamie [name] is currently in the action stage of change. As such, her goal is to continue with weight loss efforts She has agreed to follow the Pescatarian eating plan Jamie [name] has been instructed to work up to a goal of 150 minutes of combined cardio and strengthening exercise per week for weight loss and overall health benefits. We discussed the following Behavioral Modification Strategies today: work on meal planning and easy cooking plans and increase H20 intake   Jamie [name] has agreed to follow up with our clinic in 2 weeks. She was informed of the importance of frequent follow up visits to maximize her success with intensive lifestyle modifications for her multiple health conditions.   OBESITY BEHAVIORAL INTERVENTION VISIT  Today's visit was # 6 out of 22.  Starting weight: 222 lbs Starting date: 08/25/17 Today's weight : 204 lbs Today's date: 11/05/2017 Total lbs lost to date: 53 (Patients must lose 7 lbs in the first 6 months to continue with counseling)   ASK: We discussed the diagnosis of obesity with Jamie [name] today and Jamie [name] agreed to give [name] permission to discuss obesity behavioral modification therapy today.  ASSESS: Jamie [name] has the diagnosis of obesity and her BMI today is 36.15 Jamie [name] is in the action stage of change   ADVISE: Ameliah was educated on the multiple health risks of obesity as well as the benefit of weight loss to improve her health. She was advised of the need for long term treatment and the importance of lifestyle modifications.  AGREE: Multiple dietary modification options and treatment options were discussed and  Jamie [name] agreed to the above obesity treatment plan.   Jamie [name], am acting as  transcriptionist for Lacy Duverney, PA-C I, L[facility] Duver[facility], have reviewed this note and agree with its content

## 2017-11-06 NOTE — H&P (Signed)
History and Physical    Jamie [name] AYT:[id number] DOB: 03/14/1946 DOA: 11/06/2017  PCP: Deland Pretty, MD Consultants:  [facility] Pastor - GI; [place] - neurology; [facility] - urologist; [facility] - bariatrics; [place] - orthopedics; [facility] - cardiology; [facility] Holster - ENT Patient coming from:  Home - lives with husband; NOK: Husband, [phone]  Chief Complaint: Sent by PCP  HPI: Jamie [name] is a 72 y.o. female with medical history significant of CVA; OSA on CPAP; RLS; renal cell CA; HTN; GERD; and NASH presenting with hyponatremia.  She started feeling very tired.  It started maybe over the last week and has gradually been coming on.  She has been taking naps, which is unusual for her.  Over the last few months, she thought something was wrong with her glasses - she noticed difficulty focusing her glasses intermittently.  She eats and then feels somewhat nauseated.  She is on a weight program and so is unsure how this is related.  Even if she eats small quantities she feels a bit nauseated.  She denies abdominal pain.  She is not as stable on her feet as she has been in the past over the last few days, stumbling a lot.,  Her husband has noticed for several months intermittently that her voice is very weak, which is not normal for her - he is not sure she has noticed this.  It was "exactly like it is today."  Maybe a bit of confusion, possibly not significant - like today she coldn't find his phone number in her contacts.  She reports that she has lost 29 pounds since starting the diet program in February.  She is taking weight loss medication - Metformin - but the nausea seems to predate the metformin.  Dr. Migdalia Dk note from 3/19 indicates that the patinet lost a total of 5 pounds since starting treatment with them.  She is currently 1 pound less than in 3/19.  She was seen on 5/1 and had BP 82/54; she was advised to stop taking Lisinopril-HCTZ and cut metoprolol in 1/2 but the continue taking  spironolactone.  ED Course:  Symptomatic hyponatremia - nausea, gait instability, mental slowing over time.  Yesterday BP in 80s - on BP medication and stopped.  Went to PCP today, Na++ 124, repeat here 126.  Review of Systems: As per HPI; otherwise review of systems reviewed and negative.   Ambulatory Status:  Ambulates without assistance  Past Medical History:  Diagnosis Date  . Allergic rhinitis   . Anxiety   . Arthritis    left foot  . Asthma   . Back pain   . Colon polyps    adenomatous  . Constipation   . Diverticulosis   . Dysrhythmia 1999   Dr Johnsie Cancel 2003  . Elevated LFTs   . Fatty liver disease, nonalcoholic   . GERD (gastroesophageal reflux disease)   . Headache(784.0)    migraines with vision changes but no pain  . Heart murmur   . Hypertension   . IBS (irritable bowel syndrome)   . Joint pain   . Migraines   . Muscle cramps   . Obesity   . Palpitations   . Pneumonia 2007  . Renal cell carcinoma ([place]) 2013   minimal partial nephrectomy  . Restless leg syndrome   . Sleep apnea    moderate; wears CPAP, uncertain about setting  . TIA (transient ischemic attack) 09/11/2014  . Ulcer   . Urinary incontinence   . Vitamin D deficiency  Past Surgical History:  Procedure Laterality Date  . BREAST REDUCTION SURGERY  Jan 1991  . ELBOW SURGERY Left   . KIDNEY SURGERY Right October 2013    Tumor removed   . TONSILLECTOMY    . VAGINAL HYSTERECTOMY  1979   partial    Social History   Socioeconomic History  . Marital status: Married    Spouse name: Sallyanne Kuster  . Number of children: 2  . Years of education: 42  . Highest education level: Not on file  Occupational History  . Occupation: Retired    Fish farm manager: OTHER    Comment: real Conservation officer, historic buildings  Social Needs  . Financial resource strain: Not on file  . Food insecurity:    Worry: Not on file    Inability: Not on file  . Transportation needs:    Medical: Not on file    Non-medical: Not on file    Tobacco Use  . Smoking status: Never Smoker  . Smokeless tobacco: Never Used  Substance and Sexual Activity  . Alcohol use: Yes    Alcohol/week: 1.2 - 1.8 oz    Types: 1 Shots of liquor, 1 - 2 Glasses of wine per week  . Drug use: No  . Sexual activity: Yes    Birth control/protection: Surgical  Lifestyle  . Physical activity:    Days per week: Not on file    Minutes per session: Not on file  . Stress: Not on file  Relationships  . Social connections:    Talks on phone: Not on file    Gets together: Not on file    Attends religious service: Not on file    Active member of club or organization: Not on file    Attends meetings of clubs or organizations: Not on file    Relationship status: Not on file  . Intimate partner violence:    Fear of current or ex partner: Not on file    Emotionally abused: Not on file    Physically abused: Not on file    Forced sexual activity: Not on file  Other Topics Concern  . Not on file  Social History Narrative   ** Merged History Encounter **       Exercise 3-4 times a week.   Married Sallyanne Kuster) and lives with her spouse.   Retired Personal assistant.   Education- college   Patient is right handed.   Patient drinks tea a lot and water.    Allergies  Allergen Reactions  . Buprenorphine Hcl Rash and Shortness Of Breath  . Morphine And Related Shortness Of Breath and Rash  . Morphine And Related Shortness Of Breath  . Aspirin Other (See Comments)    REACTION: ulcers  Can take 81 mg  . Avelox [Moxifloxacin Hcl In Nacl] Hives  . Diflucan [Fluconazole] Hives    Whelps and rash c skin peeling  . Diflucan [Fluconazole] Hives  . Latex Rash  . Moxifloxacin Palpitations    Tachycardia     Family History  Problem Relation Age of Onset  . Heart disease Father 48  . Lung cancer Father        smoker  . Hypertension Mother   . Anxiety disorder Mother   . Heart disease Brother   . Hypertension Other   . Colon cancer Cousin   .  Allergies Daughter   . Allergies Brother   . Melanoma Daughter   . Liver disease Neg Hx   . Asthma Neg Hx   .  Esophageal cancer Neg Hx   . Rectal cancer Neg Hx   . Stomach cancer Neg Hx     Prior to Admission medications   Medication Sig Start Date End Date Taking? Authorizing Provider  aspirin EC 81 MG tablet Take 1 tablet (81 mg total) by mouth daily. 09/16/14  Yes Dohmeier, Asencion Partridge, MD  BIOTIN PO Take 1 capsule by mouth at bedtime.    Yes [provider]  clidinium-chlordiazePOXIDE (LIBRAX) 5-2.5 MG capsule Take 1 capsule by mouth 2 (two) times daily. Patient taking differently: Take 2 capsules by mouth at bedtime.  09/22/17  Yes Irene Shipper, MD  clonazePAM (KLONOPIN) 1 MG tablet Take 1 mg by mouth at bedtime.    Yes [provider]  conjugated estrogens (PREMARIN) vaginal cream Place 1 Applicatorful vaginally every other day.    Yes [provider]  diphenhydrAMINE (BENADRYL) 25 MG tablet Take 25 mg by mouth at bedtime.    Yes [provider]  esomeprazole (NEXIUM) 40 MG capsule TAKE 1 CAPSULE TWICE A DAY BEFORE MEALS 09/24/17  Yes Irene Shipper, MD  linaclotide [facility]) 145 MCG CAPS capsule Take 1 capsule (145 mcg total) by mouth daily. Patient taking differently: Take 145 mcg by mouth daily as needed (cramping).  10/30/16  Yes Irene Shipper, MD  metaxalone (SKELAXIN) 800 MG tablet Take 400 mg by mouth at bedtime.    Yes [provider]  metFORMIN (GLUCOPHAGE) 500 MG tablet Take 1 tablet (500 mg total) by mouth daily with breakfast. 11/05/17  Yes Alene Mires, Sahar M, PA-C  metoprolol succinate (TOPROL-XL) 100 MG 24 hr tablet Take 1 tablet (100 mg total) by mouth every evening. Take half a pill (50 mg) immediately following a meal. Patient taking differently: Take 50 mg by mouth every evening.  11/06/17  Yes Lacy Duverney M, PA-C  Potassium 99 MG TABS Take 1 tablet by mouth daily.   Yes [provider]  Prenatal Vit-Fe Fumarate-FA (PRENATAL  MULTIVITAMIN) TABS tablet Take 1 tablet by mouth daily at 12 noon.   Yes [provider]  Probiotic Product (PROBIOTIC-10 PO) Take 1 capsule by mouth daily.   Yes [provider]  spironolactone (ALDACTONE) 50 MG tablet Take 50 mg by mouth every morning.   Yes [provider]  Vitamin D, Ergocalciferol, (DRISDOL) 50000 units CAPS capsule Take 1 capsule (50,000 Units total) by mouth every 7 (seven) days. 10/21/17  Yes Alene Mires, Sahar M, PA-C  fluticasone (FLONASE) 50 MCG/ACT nasal spray Place 2 sprays into the nose daily as needed for rhinitis or allergies.    [provider]    Physical Exam: Vitals:   11/06/17 1830 11/06/17 1930 11/06/17 2140 11/07/17 0000  BP: (!) 93/51 (!) 105/57 (!) 87/62 (!) 97/51  Pulse: 78 75 76   Resp: 16 16 17    Temp:   97.9 F (36.6 C)   TempSrc:   Oral   SpO2: 96% 96% 100%   Weight:   98 kg (216 lb)   Height:         General:  Appears calm and comfortable and is NAD Eyes:  PERRL, EOMI, normal lids, iris ENT:  grossly normal hearing, lips & tongue, mmm Neck:  no LAD, masses or thyromegaly; no carotid bruits Cardiovascular:  RRR, no m/r/g. No LE edema.  Respiratory:   CTA bilaterally with no wheezes/rales/rhonchi.  Normal respiratory effort. Abdomen:  soft, NT, ND, NABS Back:   normal alignment, no CVAT Skin:  no rash or induration  seen on limited exam Musculoskeletal:  grossly normal tone BUE/BLE, good ROM, no bony abnormality Psychiatric:  grossly normal mood and affect, speech fluent and appropriate, AOx3 Neurologic:  CN 2-12 grossly intact, moves all extremities in coordinated fashion, sensation intact    Radiological Exams on Admission: No results found.  EKG: not done  Labs on Admission: I have personally reviewed the available labs and imaging studies at the time of the admission.  Pertinent labs:   K++ 126; prior 136 on 2/18 CO2 20 BUN 34/Creatinine 1.22/GFR 44; prior 15/0.94/62 on 2/18 WBC 11.8 A1c  5.4 on 2/18 UA: 5 ketones Urine sodium <10  Assessment/Plan Principal Problem:   Hyponatremia Active Problems:   Essential hypertension   Fatigue   Morbid obesity (HCC)   Hyponatremia -Etiology appears to be hypovolemic hyponatremia -Her BP was low in clinic and today in the hospital and this has improved while she has been receiving IVF -Suspect that this is due to diuretic use (both HCTZ and Spirolonactone) - would hold both of these medications and consider discontinuation -She has a number of vague symptoms, but it seems fairly unlikely that all of her symptoms can be attributed to symptomatic hyponatremia -Will continue to rehydrate with NS for now, but her sodium was not low enough to raise significant concern for central pontine myelinolysis with too rapid overcorrection. -ER physician checked urinary sodium level, which was low. -Will check serum and urine Osm. -Anticipate fairly rapid correction, and so placed patient in obs status. -She does have a number of other complaints that may need to be considered on an outpatient basis, and this was discussed with the patient.  HTN, chronic, now with hypotension -Hypotension appears to have been related to volume deficiency - while she reports appropriate volume intake, she does have nausea after every meal and this may be deterring her from eating/drinking -PCP already held [place] and halved dose of metoprolol -For now, continue to hold medications -Consider d/c of all diuretics  Fatigue with nausea -She listed a number of potentially concerning s/sx during our ROS. -Vision changes intermittently could indicate intracranial pathology, and yet there is nothing else to suggest this (other than nausea).  Consider CT head. -She has nausea with meals and possible early satiety.  However, she is not losing weight (despite bariatic clinic).  She thinks this pre-dates metformin, but would consider holding and/or stopping this  medication to see if this improves her symptoms.  Could also consider GI consult (?outpatient) for possible EGD. -She has been stumbling; again, head CT may be beneficial and could also consider neurology and PT consults. -Normal thyroid testing in 2/19. -I did explain to the patient and her husband that they may go home having no more answers for her symptoms than she has now, and that this might be frustrating.  However, if her exam and vitals/labs remain stable, ongoing outpatient w/u may be reasonable.  Morbid obesity -She reports much greater improvement than appears to be present -As noted above, hold Metformin -Continue outpatient bariatric clinic f/u  DVT prophylaxis:  Lovenox  Code Status:  Full - confirmed with patient/family Family Communication: Husband present throughout evaluation Disposition Plan:  Home once clinically improved Consults called: None  Admission status:  It is my clinical opinion that referral for OBSERVATION is reasonable and necessary in this patient based on the above information provided. The aforementioned taken together are felt to place the patient at high risk for further clinical deterioration. However it is anticipated that the  patient may be medically stable for discharge from the hospital within 24 to 48 hours.    Karmen Bongo MD [facility]  If note is complete, please contact covering daytime or nighttime physician. [email or web address] Password [email or web address]  11/07/2017, 12:01 AM

## 2017-11-06 NOTE — ED Triage Notes (Signed)
Pt reports that she had blood drawn at her PCP today and they sent her here because her sodium was 124. She endorses fatigue, some nausea, and a slight headache. Ambulatory with assistance. A&Ox4.

## 2017-11-06 NOTE — ED Notes (Signed)
Per NP at clinic, states increased weakness-states Na 124

## 2017-11-06 NOTE — Telephone Encounter (Signed)
Patient called, stating she was returning Sahar's phone call.  She thinks it may be regarding her BP from yesterday's appointment.  [phone]

## 2017-11-06 NOTE — Telephone Encounter (Signed)
Pt with history of HTN,  BP in office on 11/05/17 was low at 82/54 and pt had associated symptom of fatigue. Advised pt to stop taking Lisinopril-HCTZ , take Metoprolol 50 mg (half a pill) and to continue spironolactone. Called pt 11/06/17: Pt states with above regimen, repeat BP is still low 95/85. She feels the fatigue, and new  changes to her urination. Pt is advised to continue her current BP regimen. She is to follow up with her PCP today, and if unable to, to go to an Urgent Care for further workup of her symptoms and for further monitoring of her BP. She voiced understanding. She is to follow up with our clinic in scheduled visit in 2 weeks.  Lacy Duverney [facility]

## 2017-11-06 NOTE — ED Provider Notes (Signed)
[facility] DEPT Provider Note  CSN: [id number] Arrival date & time: 11/06/17 1606  Chief Complaint(s) Hyponatremia (sent from PCP after blood work)  HPI Jamie [name] is a 72 y.o. female   HPI  CC: fatigued  Onset/Duration: several months, but worsened over the last week Timing: constant, worsening Location: generalized Severity: moderate to severe Modifying Factors:  Improved by: nothing  Worsened by: activity Associated Signs/Symptoms:  Pertinent (+): gait difficulty, 'fuzzy headed'; headache; nausea, frequency (dribbling)  Pertinent (-): fevers, chills, no focal weakness, chest pain, sob, abd pain, diarrhea, emesis, dysuria; hematochezia or melena. Context: on [facility] bariatric wt loss diet. Drinks a lot of water  Spoke withPCP yesterday due to hypotension and instructed to hold BP meds and follow up in clinic today. Today she was noted to have hyponatremia and sent here for further work up and management.  Past Medical History Past Medical History:  Diagnosis Date  . Allergic rhinitis   . Anxiety   . Arthritis    left foot  . Asthma   . Back pain   . Colon polyps    adenomatous  . Constipation   . Diverticulosis   . Dyspnea   . Dysrhythmia 1999   Dr Johnsie Cancel 2003  . Elevated LFTs   . Fatty liver disease, nonalcoholic   . GERD (gastroesophageal reflux disease)   . Headache(784.0)    migraines with vision changes but no pain  . Heart murmur   . Hypertension   . IBS (irritable bowel syndrome)   . Joint pain   . Migraines   . Muscle cramps   . Obesity   . Palpitations   . Pneumonia 2007  . Renal cell carcinoma ([place]) 2013  . Restless leg syndrome   . Sleep apnea    moderate; wears CPAP, uncertain about setting  . Stroke [facility])    transient ischemic attack  . TIA (transient ischemic attack) 09/11/2014  . Ulcer   . Urinary incontinence   . Vitamin D deficiency    Patient Active Problem List   Diagnosis Date Noted  . Other  fatigue 08/25/2017  . Shortness of breath on exertion 08/25/2017  . Vitamin D deficiency 08/25/2017  . Hyperglycemia 08/25/2017  . 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 TIA (transient ischemic attack) 12/19/2016  . Common migraine 12/19/2016  . Obstructive sleep apnea on CPAP 12/19/2016  . Acute urinary retention 11/21/2016  . 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 Diverticulitis 11/20/2016  . Pain in left ankle and joints of left foot 08/13/2016  . Pain and swelling of left ankle 08/13/2016  . At risk for polypharmacy 10/02/2015  . Intractable migraine with aura with status migrainosus 10/02/2015  . Other specified transient cerebral ischemias 09/16/2014  . 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 Morbid obesity ([place]) 09/16/2014  . Headache 05/03/2013  . Preop exam for internal medicine 03/10/2012  . Kidney mass 03/10/2012  . UTI (lower urinary tract infection) 02/13/2012  . Excessive tearing 12/16/2011  . Leg pain 09/20/2011  . Acute bronchitis 06/27/2011  . Wheezing 06/27/2011  . Rosacea 05/07/2011  . Shoulder pain, right 05/07/2011  . OSA (obstructive sleep apnea) 01/31/2011  . Confusion state 11/26/2010  . ABNORMAL ELECTROCARDIOGRAM 08/28/2010  . Tinnitus 08/09/2010  . SINUSITIS, CHRONIC 08/09/2010  . Dizziness and giddiness 01/18/2010  . HYPERGLYCEMIA 08/23/2009  . LIVER FUNCTION TESTS, ABNORMAL, HX OF 08/23/2009  . FATIGUE 01/16/2009  . PERSONAL HX COLONIC POLYPS 09/21/2008  .  ADENOMATOUS COLONIC POLYP 09/20/2008  . ALLERGIC RHINITIS 04/28/2008  . RESTRICTIVE LUNG DISEASE 04/28/2008  . CRAMPS,LEG 09/28/2007  . OBESITY 04/01/2007  . VOCAL CORD DISORDER 04/01/2007  . REDUCTION MAMMOPLASTY, HX OF 04/01/2007  . RESTLESS LEG SYNDROME 02/02/2007  . Essential hypertension 02/02/2007  . MITRAL VALVE PROLAPSE 02/02/2007  . GERD 02/02/2007  . Irritable bowel syndrome 02/02/2007  . BREAST CYST 02/02/2007    Home Medication(s) Prior to Admission medications   Medication Sig Start Date End Date Taking? Authorizing Provider  aspirin EC 81 MG tablet Take 1 tablet (81 mg total) by mouth daily. 09/16/14  Yes Dohmeier, Asencion Partridge, MD  BIOTIN PO Take 1 capsule by mouth at bedtime.    Yes [provider]  clidinium-chlordiazePOXIDE (LIBRAX) 5-2.5 MG capsule Take 1 capsule by mouth 2 (two) times daily. Patient taking differently: Take 2 capsules by mouth at bedtime.  09/22/17  Yes Irene Shipper, MD  clonazePAM (KLONOPIN) 1 MG tablet Take 1 mg by mouth at bedtime.    Yes [provider]  conjugated estrogens (PREMARIN) vaginal cream Place 1 Applicatorful vaginally every other day.    Yes [provider]  diphenhydrAMINE (BENADRYL) 25 MG tablet Take 25 mg by mouth at bedtime.    Yes [provider]  esomeprazole (NEXIUM) 40 MG capsule TAKE 1 CAPSULE TWICE A DAY BEFORE MEALS 09/24/17  Yes Irene Shipper, MD  linaclotide [facility]) 145 MCG CAPS capsule Take 1 capsule (145 mcg total) by mouth daily. Patient taking differently: Take 145 mcg by mouth daily as needed (cramping).  10/30/16  Yes Irene Shipper, MD  metaxalone (SKELAXIN) 800 MG tablet Take 400 mg by mouth at bedtime.    Yes [provider]  metFORMIN (GLUCOPHAGE) 500 MG tablet Take 1 tablet (500 mg total) by mouth daily with breakfast. 11/05/17  Yes Alene Mires, Sahar M, PA-C  metoprolol succinate (TOPROL-XL) 100 MG 24 hr tablet Take 1 tablet (100 mg total) by mouth every evening. Take half a pill (50 mg) immediately following a meal. Patient taking differently: Take 50 mg by mouth every evening.  11/06/17  Yes Lacy Duverney M, PA-C  Potassium 99 MG TABS Take 1 tablet by mouth daily.   Yes [provider]  Prenatal Vit-Fe Fumarate-FA (PRENATAL MULTIVITAMIN) TABS tablet Take 1 tablet by mouth daily at 12 noon.   Yes [provider]  Probiotic Product (PROBIOTIC-10 PO) Take 1 capsule by mouth daily.   Yes  [provider]  spironolactone (ALDACTONE) 50 MG tablet Take 50 mg by mouth every morning.   Yes [provider]  Vitamin D, Ergocalciferol, (DRISDOL) 50000 units CAPS capsule Take 1 capsule (50,000 Units total) by mouth every 7 (seven) days. 10/21/17  Yes Alene Mires, Sahar M, PA-C  fluticasone (FLONASE) 50 MCG/ACT nasal spray Place 2 sprays into the nose daily as needed for rhinitis or allergies.    [provider]  Past Surgical History Past Surgical History:  Procedure Laterality Date  . BREAST REDUCTION SURGERY  Jan 1991  . ELBOW SURGERY Left   . KIDNEY SURGERY Right October 2013    Tumor removed   . TONSILLECTOMY    . VAGINAL HYSTERECTOMY  1979   partial   Family History Family History  Problem Relation Age of Onset  . Heart disease Father 74  . Lung cancer Father        smoker  . Hypertension Mother   . Anxiety disorder Mother   . Heart disease Brother   . Hypertension Other   . Colon cancer Cousin   . Allergies Daughter   . Allergies Brother   . Melanoma Daughter   . Liver disease Neg Hx   . Asthma Neg Hx   . Esophageal cancer Neg Hx   . Rectal cancer Neg Hx   . Stomach cancer Neg Hx     Social History Social History   Tobacco Use  . Smoking status: Never Smoker  . Smokeless tobacco: Never Used  Substance Use Topics  . Alcohol use: Yes    Alcohol/week: 1.2 - 1.8 oz    Types: 2 - 3 Glasses of wine per week    Comment: 3-4 times a week  . Drug use: No   Allergies Buprenorphine hcl; Morphine and related; Morphine and related; Aspirin; Avelox [moxifloxacin hcl in nacl]; Diflucan [fluconazole]; Diflucan [fluconazole]; Latex; and Moxifloxacin  Review of Systems Review of Systems All other systems are reviewed and are negative for acute change except as noted in the HPI  Physical Exam Vital Signs  I have  reviewed the triage vital signs BP (!) 101/58   Pulse 76   Temp 98.3 F (36.8 C) (Oral)   Resp 18   Ht 5' 3"  (1.6 m)   Wt 91.6 kg (202 lb)   SpO2 100%   BMI 35.78 kg/m   Physical Exam  Constitutional: She is oriented to person, place, and time. She appears well-developed and well-nourished. No distress.  HENT:  Head: Normocephalic and atraumatic.  Nose: Nose normal.  Eyes: Pupils are equal, round, and reactive to light. Conjunctivae and EOM are normal. Right eye exhibits no discharge. Left eye exhibits no discharge. No scleral icterus.  Neck: Normal range of motion. Neck supple.  Cardiovascular: Normal rate and regular rhythm. Exam reveals no gallop and no friction rub.  No murmur heard. Pulmonary/Chest: Effort normal and breath sounds normal. No stridor. No respiratory distress. She has no rales.  Abdominal: Soft. She exhibits no distension. There is no tenderness.  Musculoskeletal: She exhibits no edema or tenderness.  Neurological: She is alert and oriented to person, place, and time.  Mental Status:  Alert and oriented to person, place, and time.  Attention and concentration normal.  Speech clear.  Recent memory is intact  Cranial Nerves:  II Visual Fields: Intact to confrontation. Visual fields intact. III, IV, VI: Pupils equal and reactive to light and near. Full eye movement without nystagmus  V Facial Sensation: Normal. No weakness of masticatory muscles  VII: No facial weakness or asymmetry  VIII Auditory Acuity: Grossly normal  IX/X: The uvula is midline; the palate elevates symmetrically  XI: Normal sternocleidomastoid and trapezius strength  XII: The tongue is midline. No atrophy or fasciculations.   Motor System: Muscle Strength: 5/5 and symmetric in the upper and lower extremities. No pronation or drift.  Muscle Tone: Tone and muscle bulk are normal in the upper and lower extremities.  Reflexes: DTRs: 1+ and symmetrical in all four extremities. No  Clonus Coordination: Intact finger-to-nose, heel-to-shin. No tremor.  Sensation: Intact to light touch, and pinprick. Positive Romberg test.  Gait: ataxic   Skin: Skin is warm and dry. No rash noted. She is not diaphoretic. No erythema.  Psychiatric: She has a normal mood and affect.  Vitals reviewed.   ED Results and Treatments Labs (all labs ordered are listed, but only abnormal results are displayed) Labs Reviewed  CBC WITH DIFFERENTIAL/PLATELET - Abnormal; Notable for the following components:      Result Value   WBC 11.8 (*)    RBC 3.85 (*)    HCT 35.4 (*)    Neutro Abs 9.7 (*)    All other components within normal limits  BASIC METABOLIC PANEL - Abnormal; Notable for the following components:   Sodium 126 (*)    Chloride 96 (*)    CO2 20 (*)    BUN 34 (*)    Creatinine, Ser 1.22 (*)    GFR calc non Af Amer 44 (*)    GFR calc Af Amer 51 (*)    All other components within normal limits  URINALYSIS, ROUTINE W REFLEX MICROSCOPIC - Abnormal; Notable for the following components:   Ketones, ur 5 (*)    All other components within normal limits  SODIUM, URINE, RANDOM                                                                                                                         EKG  EKG Interpretation  Date/Time:    Ventricular Rate:    PR Interval:    QRS Duration:   QT Interval:    QTC Calculation:   R Axis:     Text Interpretation:        Radiology No results found. Pertinent labs & imaging results that were available during my care of the patient were reviewed by me and considered in my medical decision making (see chart for details).  Medications Ordered in ED Medications  sodium chloride 0.9 % bolus 1,000 mL (1,000 mLs Intravenous New Bag/Given 11/06/17 1735)                                                                                                                                    Procedures Procedures CRITICAL CARE Performed by: Grayce Sessions Cardama Total critical care time: 65  minutes Critical care time was exclusive of separately billable procedures and treating other patients. Critical care was necessary to treat or prevent imminent or life-threatening deterioration. Critical care was time spent personally by me on the following activities: development of treatment plan with patient and/or surrogate as well as nursing, discussions with consultants, evaluation of patient's response to treatment, examination of patient, obtaining history from patient or surrogate, ordering and performing treatments and interventions, ordering and review of laboratory studies, ordering and review of radiographic studies, pulse oximetry and re-evaluation of patient's condition.   (including critical care time)  Medical Decision Making / ED Course I have reviewed the nursing notes for this encounter and the patient's prior records (if available in EHR or on provided paperwork).    Hyponatremia confirmed.  Patient is symptomatic and ataxic requiring admission for slow IV repletion.  IV fluids initiated in the emergency department.  Case discussed with Dr. Lorin Mercy from the hospitalist service will admit the patient for continued work-up and management.    Final Clinical Impression(s) / ED Diagnoses Final diagnoses:  Hyponatremia  Ataxia  Nausea  Other fatigue      This chart was dictated using voice recognition software.  Despite best efforts to proofread,  errors can occur which can change the documentation meaning.   Fatima Blank, MD 11/06/17 [phone]

## 2017-11-07 DIAGNOSIS — R5383 Other fatigue: Secondary | ICD-10-CM | POA: Diagnosis not present

## 2017-11-07 DIAGNOSIS — E871 Hypo-osmolality and hyponatremia: Secondary | ICD-10-CM | POA: Diagnosis not present

## 2017-11-07 DIAGNOSIS — I10 Essential (primary) hypertension: Secondary | ICD-10-CM | POA: Diagnosis not present

## 2017-11-07 LAB — CBC
HCT: 30.6 % — ABNORMAL LOW (ref 36.0–46.0)
Hemoglobin: 10.3 g/dL — ABNORMAL LOW (ref 12.0–15.0)
MCH: 31.2 pg (ref 26.0–34.0)
MCHC: 33.7 g/dL (ref 30.0–36.0)
MCV: 92.7 fL (ref 78.0–100.0)
PLATELETS: 196 10*3/uL (ref 150–400)
RBC: 3.3 MIL/uL — ABNORMAL LOW (ref 3.87–5.11)
RDW: 12.8 % (ref 11.5–15.5)
WBC: 6.3 10*3/uL (ref 4.0–10.5)

## 2017-11-07 LAB — BASIC METABOLIC PANEL
Anion gap: 7 (ref 5–15)
BUN: 21 mg/dL — ABNORMAL HIGH (ref 6–20)
CHLORIDE: 104 mmol/L (ref 101–111)
CO2: 19 mmol/L — AB (ref 22–32)
Calcium: 9 mg/dL (ref 8.9–10.3)
Creatinine, Ser: 0.89 mg/dL (ref 0.44–1.00)
GFR calc non Af Amer: >60 mL/min (ref >60)
Glucose, Bld: 97 mg/dL (ref 65–99)
Potassium: 4.6 mmol/L (ref 3.5–5.1)
Sodium: 130 mmol/L — ABNORMAL LOW (ref 135–145)

## 2017-11-07 LAB — SODIUM: Sodium: 134 mmol/L — ABNORMAL LOW (ref 135–145)

## 2017-11-07 LAB — OSMOLALITY: Osmolality: 278 mOsm/kg (ref 275–295)

## 2017-11-07 LAB — OSMOLALITY, URINE: OSMOLALITY UR: 177 mosm/kg — AB (ref 300–900)

## 2017-11-07 NOTE — Care Management Obs Status (Signed)
[facility] NOTIFICATION   Patient Details  Name: ARYAM ZHAN MRN: [id number] Date of Birth: 01-19-1946   Medicare Observation Status Notification Given:  Yes    Leeroy Cha, RN 11/07/2017, 10:38 AM

## 2017-11-07 NOTE — Discharge Summary (Signed)
Physician Discharge Summary  Jamie [name] JSH:[id number] DOB: [birth year] DOA: 11/06/2017  PCP: Deland Pretty, MD  Admit date: 11/06/2017 Discharge date: 11/07/2017  Time spent: 45 minutes  Recommendations for Outpatient Follow-up:  Patient will be discharged to home.  Patient will need to follow up with primary care provider within one week of discharge, repeat BMP.  Patient should continue medications as prescribed.  Patient should follow a regular diet.   Discharge Diagnoses:  Hyponatremia Fatigue with nausea Essential hypertension with episodes of hypotension Morbid obesity  Discharge Condition: Stable  Diet recommendation: regular  Filed Weights   11/06/17 1614 11/06/17 2140  Weight: 91.6 kg (202 lb) 98 kg (216 lb)    History of present illness:  On 11/06/2017 by Dr. Karmen Bongo Jamie D Morrisonis a 72 y.o.femalewith medical history significant ofCVA; OSA on CPAP; RLS; renal cell CA; HTN; GERD; and NASH presenting with hyponatremia.She started feeling very tired. It started maybe over the last week and has gradually been coming on. She has been taking naps, which is unusual for her. Over the last few months, she thought something was wrong with her glasses - she noticed difficulty focusing her glasses intermittently. She eats and then feels somewhat nauseated. She ison a weight program and so is unsure how this is related. Even if she eats small quantities she feels a bit nauseated. She denies abdominal pain. She is not as stable on her feetas she has been in the past over the last few days, stumbling a lot., Her husband has noticed for several months intermittently that her voice is very weak, which is not normal for her - he is not sure she has noticed this. It was "exactly like it is today." Maybe a bit of confusion, possibly not significant - like today she coldn't find his phone number in her contacts. She reports that she haslost 29 pounds since starting  the diet program in February. She is taking weight loss medication - Metformin - but the nausea seems to predate the metformin.  Dr. Migdalia Dk note from 3/19 indicates that the patinet lost a total of 5 pounds since starting treatment with them. She is currently 1 pound less than in 3/19. She was seen on 5/1 and had BP 82/54; she was advised to stop taking Lisinopril-HCTZ and cut metoprolol in 1/2 but the continue taking spironolactone.  Hospital Course:  Hyponatremia -Possibly secondary to diuretic use with HCTZ and spironolactone, these medications have been held -Neurologically intact -Sodium upon admission 126, currently 134 -Repeat BMP in one week  Fatigue with nausea -Possibly secondary to the above versus metformin -Currently has no neurological deficits -PT and OT consulted, no further therapy needs -Patient did have normal thyroid testing and February 2019 -Continue symptomatic care  Essential hypertension with episodes of hypotension -Suspect hypotension related to volume deficiency -PCP has held lisinopril/HCTZ at half dose of metoprolol -Currently holding spironolactone -BP currently stable  Morbid obesity -Patient placed on metformin as an outpatient aid in her weight loss.  Also with PCP for weight loss plan, and has lost approximately 5 pounds since starting. -Continue outpatient follow-up  Procedures: none  Consultations: none  Discharge Exam: Vitals:   11/07/17 0529 11/07/17 1417  BP: (!) 96/58 106/61  Pulse: 69 74  Resp: 18 18  Temp: (!) 97.5 F (36.4 C) 97.8 F (36.6 C)  SpO2: 98% 99%     General: Well developed, well nourished, NAD, appears stated age  HEENT: NCAT, mucous membranes moist.  Neck: Supple  Cardiovascular: S1 S2 auscultated, RRR, no murmur  Respiratory: Clear to auscultation bilaterally with equal chest rise  Abdomen: Soft, obese, nontender, nondistended, + bowel sounds  Extremities: warm dry without cyanosis clubbing  or edema  Neuro: AAOx3, nonfocal  Psych: Normal affect and demeanor  Discharge Instructions Discharge Instructions    Discharge instructions   Complete by:  As directed    Patient will be discharged to home.  Patient will need to follow up with primary care provider within one week of discharge, repeat BMP.  Patient should continue medications as prescribed.  Patient should follow a regular diet.     Allergies as of 11/07/2017      Reactions   Buprenorphine Hcl Rash, Shortness Of Breath   Morphine And Related Shortness Of Breath, Rash   Morphine And Related Shortness Of Breath   Aspirin Other (See Comments)   REACTION: ulcers Can take 81 mg   Avelox [moxifloxacin Hcl In Nacl] Hives   Diflucan [fluconazole] Hives   Whelps and rash c skin peeling   Diflucan [fluconazole] Hives   Latex Rash   Moxifloxacin Palpitations   Tachycardia      Medication List    STOP taking these medications   metoprolol succinate 100 MG 24 hr tablet Commonly known as:  TOPROL-XL   spironolactone 50 MG tablet Commonly known as:  ALDACTONE     TAKE these medications   aspirin EC 81 MG tablet Take 1 tablet (81 mg total) by mouth daily.   BIOTIN PO Take 1 capsule by mouth at bedtime.   clidinium-chlordiazePOXIDE 5-2.5 MG capsule Commonly known as:  LIBRAX Take 1 capsule by mouth 2 (two) times daily. What changed:    how much to take  when to take this   clonazePAM 1 MG tablet Commonly known as:  KLONOPIN Take 1 mg by mouth at bedtime.   conjugated estrogens vaginal cream Commonly known as:  PREMARIN Place 1 Applicatorful vaginally every other day.   diphenhydrAMINE 25 MG tablet Commonly known as:  BENADRYL Take 25 mg by mouth at bedtime.   esomeprazole 40 MG capsule Commonly known as:  NEXIUM TAKE 1 CAPSULE TWICE A DAY BEFORE MEALS   fluticasone 50 MCG/ACT nasal spray Commonly known as:  FLONASE Place 2 sprays into the nose daily as needed for rhinitis or allergies.     linaclotide 145 MCG Caps capsule Commonly known as:  LINZESS Take 1 capsule (145 mcg total) by mouth daily. What changed:    when to take this  reasons to take this   metaxalone 800 MG tablet Commonly known as:  SKELAXIN Take 400 mg by mouth at bedtime.   metFORMIN 500 MG tablet Commonly known as:  GLUCOPHAGE Take 1 tablet (500 mg total) by mouth daily with breakfast.   Potassium 99 MG Tabs Take 1 tablet by mouth daily.   prenatal multivitamin Tabs tablet Take 1 tablet by mouth daily at 12 noon.   PROBIOTIC-10 PO Take 1 capsule by mouth daily.   Vitamin D (Ergocalciferol) 50000 units Caps capsule Commonly known as:  DRISDOL Take 1 capsule (50,000 Units total) by mouth every 7 (seven) days.      Allergies  Allergen Reactions  . Buprenorphine Hcl Rash and Shortness Of Breath  . Morphine And Related Shortness Of Breath and Rash  . Morphine And Related Shortness Of Breath  . Aspirin Other (See Comments)    REACTION: ulcers  Can take 81 mg  . Avelox [Moxifloxacin Hcl In Nacl] Hives  .  Diflucan [Fluconazole] Hives    Whelps and rash c skin peeling  . Diflucan [Fluconazole] Hives  . Latex Rash  . Moxifloxacin Palpitations    Tachycardia    Follow-up Information    Deland Pretty, MD. Schedule an appointment as soon as possible for a visit in 1 week(s).   Specialty:  Internal Medicine Why:  Hospital follow up Contact information: [address] [place] [address] [phone]            The results of significant diagnostics from this hospitalization (including imaging, microbiology, ancillary and laboratory) are listed below for reference.    Significant Diagnostic Studies: No results found.  Microbiology: No results found for this or any previous visit (from the past 240 hour(s)).   Labs: Basic Metabolic Panel: Recent Labs  Lab 11/06/17 1726 11/07/17 0634 11/07/17 1426  NA 126* 130* 134*  K 4.5 4.6  --   CL 96* 104  --    CO2 20* 19*  --   GLUCOSE 88 97  --   BUN 34* 21*  --   CREATININE 1.22* 0.89  --   CALCIUM 9.9 9.0  --    Liver Function Tests: No results for input(s): AST, ALT, ALKPHOS, BILITOT, PROT, ALBUMIN in the last 168 hours. No results for input(s): LIPASE, AMYLASE in the last 168 hours. No results for input(s): AMMONIA in the last 168 hours. CBC: Recent Labs  Lab 11/06/17 1726 11/07/17 0634  WBC 11.8* 6.3  NEUTROABS 9.7*  --   HGB 12.3 10.3*  HCT 35.4* 30.6*  MCV 91.9 92.7  PLT 274 196   Cardiac Enzymes: No results for input(s): CKTOTAL, CKMB, CKMBINDEX, TROPONINI in the last 168 hours. BNP: BNP (last 3 results) No results for input(s): BNP in the last 8760 hours.  ProBNP (last 3 results) No results for input(s): PROBNP in the last 8760 hours.  CBG: No results for input(s): GLUCAP in the last 168 hours.     Signed:  Cristal Ford  [facility] 11/07/2017, 3:16 PM

## 2017-11-07 NOTE — Evaluation (Signed)
Physical Therapy Evaluation Patient Details Name: Jamie [name] MRN: [id number] DOB: [birth year] Today's Date: 11/07/2017   History of Present Illness  Jamie [name] is a 72 y.o. female with medical history significant of CVA; OSA on CPAP; RLS; renal cell CA; HTN; GERD; and NASH presenting with hyponatremia.     Clinical Impression  Patient independent in bed mobility and supervision for transfers and ambulation without assistive device 450 feet without sign of fatigue. Husband present during evaluation. PT did educated husband on where to guard patient when she is going up and down stairs to/from bedroom at home for safety. Patient evaluated by Physical Therapy with no further acute PT needs identified. All education has been completed and the patient has no further questions. See below for any follow-up Physical Therapy or equipment needs. PT is signing off. Thank you for this referral.     Follow Up Recommendations No PT follow up    Equipment Recommendations  None recommended by PT    Recommendations for Other Services       Precautions / Restrictions Restrictions Weight Bearing Restrictions: No      Mobility  Bed Mobility Overal bed mobility: Independent                Transfers Overall transfer level: Independent                  Ambulation/Gait Ambulation/Gait assistance: Supervision Ambulation Distance (Feet): 450 Feet Assistive device: None Gait Pattern/deviations: Step-through pattern;Decreased stride length;Decreased step length - right;Decreased step length - left     General Gait Details: slow, steady pace with slight balance variations regained independently.  Stairs            Wheelchair Mobility    Modified Rankin (Stroke Patients Only)       Balance Overall balance assessment: Mild deficits observed, not formally tested(tandem balance Rt and Lt LE forward - >15 sec each)                                            Pertinent Vitals/Pain Pain Assessment: No/denies pain    Home Living Family/patient expects to be discharged to:: Private residence Living Arrangements: Spouse/significant other Available Help at Discharge: Family Type of Home: House Home Access: Stairs to enter Entrance Stairs-Rails: None Technical brewer of Steps: 3 Home Layout: Two level Home Equipment: Cane - single point      Prior Function Level of Independence: Independent               Hand Dominance        Extremity/Trunk Assessment   Upper Extremity Assessment Upper Extremity Assessment: Generalized weakness    Lower Extremity Assessment Lower Extremity Assessment: Generalized weakness       Communication   Communication: No difficulties  Cognition Arousal/Alertness: Awake/alert Behavior During Therapy: WFL for tasks assessed/performed Overall Cognitive Status: Within Functional Limits for tasks assessed                                        General Comments      Exercises     Assessment/Plan    PT Assessment Patent does not need any further PT services  PT Problem List         PT Treatment Interventions  PT Goals (Current goals can be found in the Care Plan section)  Acute Rehab PT Goals Patient Stated Goal: go home PT Goal Formulation: With patient/family Time For Goal Achievement: 11/14/17 Potential to Achieve Goals: Good    Frequency     Barriers to discharge        Co-evaluation               AM-PAC PT "6 Clicks" Daily Activity  Outcome Measure Difficulty turning over in bed (including adjusting bedclothes, sheets and blankets)?: None Difficulty moving from lying on back to sitting on the side of the bed? : None Difficulty sitting down on and standing up from a chair with arms (e.g., wheelchair, bedside commode, etc,.)?: None Help needed moving to and from a bed to chair (including a wheelchair)?: None Help needed walking in  hospital room?: None Help needed climbing 3-5 steps with a railing? : A Little 6 Click Score: 23    End of Session Equipment Utilized During Treatment: Gait belt Activity Tolerance: Patient tolerated treatment well Patient left: in chair;with family/visitor present Nurse Communication: Mobility status PT Visit Diagnosis: Muscle weakness (generalized) (M62.81);Unsteadiness on feet (R26.81);Difficulty in walking, not elsewhere classified (R26.2)    Time: 5072-2575 PT Time Calculation (min) (ACUTE ONLY): 27 min   Charges:   PT Evaluation $PT Eval Low Complexity: 1 Low PT Treatments $Gait Training: 8-22 mins   PT G Codes:        Verbon Giangregorio D. Hartnett-Rands, MS, PT Per [place] [phone] 11/07/2017, 11:08 AM

## 2017-11-07 NOTE — Progress Notes (Signed)
OT Cancellation Note  Patient Details Name: Jamie [name] MRN: [id number] DOB: [birth year]   Cancelled Treatment:    Reason Eval/Treat Not Completed: OT screened, no needs identified, will sign off.  Checked in with pt. She was ambulating at a supervision level with PT. She does not feel she needs OT. Will sign off.   Chayanne Filippi 11/07/2017, 1:39 PM  Lesle Chris, OTR/L [phone] 11/07/2017

## 2017-11-07 NOTE — Discharge Instructions (Signed)
Hyponatremia Hyponatremia is when the amount of salt (sodium) in your blood is too low. When salt levels are low, your cells absorb extra water and they swell. The swelling happens throughout the body, but it mostly affects the brain. Follow these instructions at home:  Take medicines only as told by your doctor. Many medicines can make this condition worse. Talk with your doctor about any medicines that you are currently taking.  Carefully follow a recommended diet as told by your doctor.  Carefully follow instructions from your doctor about fluid restrictions.  Keep all follow-up visits as told by your doctor. This is important.  Do not drink alcohol. Contact a doctor if:  You feel sicker to your stomach (nauseous).  You feel more confused.  You feel more tired (fatigued).  Your headache gets worse.  You feel weaker.  Your symptoms go away and then they come back.  You have trouble following the diet instructions. Get help right away if:  You start to twitch and shake (have a seizure).  You pass out (faint).  You keep having watery poop (diarrhea).  You keep throwing up (vomiting). This information is not intended to replace advice given to you by your health care provider. Make sure you discuss any questions you have with your health care provider. Document Released: 03/06/2011 Document Revised: 11/30/2015 Document Reviewed: 06/20/2014 Elsevier Interactive Patient Education  [employer].

## 2017-11-07 NOTE — Care Management Note (Signed)
Case Management Note  Patient Details  Name: Jamie [name] MRN: [id number] Date of Birth: 26-Feb-1946  Subjective/Objective:                  hyponaytremia 126 on admission/iv flds [id number] 130.  Action/Plan: Date: Nov 07, 2017 Velva Harman, BSN, [place], [place] Chart and notes review for patient progress and needs. Will follow for case management and discharge needs. No cm or discharge needs present at time of this review. Next review date: [id number]  Expected Discharge Date:  (unknown)               Expected Discharge Plan:  Home/Self Care  In-House Referral:     Discharge planning Services  CM Consult  Post Acute Care Choice:    Choice offered to:     DME Arranged:    DME Agency:     HH Arranged:    HH Agency:     Status of Service:  In process, will continue to follow  If discussed at Long Length of Stay Meetings, dates discussed:    Additional Comments:  Leeroy Cha, RN 11/07/2017, 10:21 AM

## 2017-11-07 NOTE — Progress Notes (Signed)
PROGRESS NOTE    [name]  FGH:[id number] DOB: [birth year] DOA: 11/06/2017 PCP: Deland Pretty, MD   Brief Narrative:  HPI On 11/06/2017 by Dr. Karmen Bongo Shiquita D Jamie [name] is a 72 y.o. female with medical history significant of CVA; OSA on CPAP; RLS; renal cell CA; HTN; GERD; and NASH presenting with hyponatremia.  She started feeling very tired.  It started maybe over the last week and has gradually been coming on.  She has been taking naps, which is unusual for her.  Over the last few months, she thought something was wrong with her glasses - she noticed difficulty focusing her glasses intermittently.  She eats and then feels somewhat nauseated.  She is on a weight program and so is unsure how this is related.  Even if she eats small quantities she feels a bit nauseated.  She denies abdominal pain.  She is not as stable on her feet as she has been in the past over the last few days, stumbling a lot.,  Her husband has noticed for several months intermittently that her voice is very weak, which is not normal for her - he is not sure she has noticed this.  It was "exactly like it is today."  Maybe a bit of confusion, possibly not significant - like today she coldn't find his phone number in her contacts.  She reports that she has lost 29 pounds since starting the diet program in February.  She is taking weight loss medication - Metformin - but the nausea seems to predate the metformin.  Dr. Migdalia Dk note from 3/19 indicates that the patinet lost a total of 5 pounds since starting treatment with them.  She is currently 1 pound less than in 3/19.  She was seen on 5/1 and had BP 82/54; she was advised to stop taking Lisinopril-HCTZ and cut metoprolol in 1/2 but the continue taking spironolactone.   Assessment & Plan   Hyponatremia -Possibly secondary to diuretic use with HCTZ and spironolactone, these medications have been held -Neurologically intact -Sodium upon admission 126, currently  130 -Continue normal saline, will repeat sodium later today -Continue to monitor BMP  Fatigue with nausea -Possibly secondary to the above versus metformin -Currently has no neurological deficits -PT and OT consulted, no further therapy needs -Patient did have normal thyroid testing and February 2019 -Continue symptomatic care  Essential hypertension with episodes of hypotension -Suspect hypotension related to volume deficiency -PCP has held lisinopril/HCTZ at half dose of metoprolol -Currently holding spironolactone -BP currently stable  Morbid obesity -Patient placed on metformin as an outpatient aid in her weight loss.  Also with PCP for weight loss plan, and has lost approximately 5 pounds since starting. -Continue outpatient follow-up  DVT Prophylaxis  lovenox  Code Status: Full  Family Communication: None at bedside  Disposition Plan: Observation, will continue to monitor sodium. Suspect home in 24 hrs  Consultants None  Procedures  None  Antibiotics   Anti-infectives (From admission, onward)   None      Subjective:   Jamie [name] seen and examined today.  Feeling better today.  Denies current chest pain, shortness breath, abdominal pain, nausea or vomiting, diarrhea.  States that she has bowel movements every few days given her new diet and oral intake.  Objective:   Vitals:   11/06/17 1930 11/06/17 2140 11/07/17 0000 11/07/17 0529  BP: (!) 105/57 (!) 87/62 (!) 97/51 (!) 96/58  Pulse: 75 76  69  Resp: 16 17  18  Temp:  97.9 F (36.6 C)  (!) 97.5 F (36.4 C)  TempSrc:  Oral  Oral  SpO2: 96% 100%  98%  Weight:  98 kg (216 lb)    Height:        Intake/Output Summary (Last 24 hours) at 11/07/2017 1409 Last data filed at 11/07/2017 0600 Gross per 24 hour  Intake 2468.75 ml  Output -  Net 2468.75 ml   Filed Weights   11/06/17 1614 11/06/17 2140  Weight: 91.6 kg (202 lb) 98 kg (216 lb)    Exam  General: Well developed, well nourished, NAD,  appears stated age  HEENT: NCAT, mucous membranes moist.   Neck: Supple  Cardiovascular: S1 S2 auscultated, no rubs, murmurs or gallops. Regular rate and rhythm.  Respiratory: Clear to auscultation bilaterally with equal chest rise  Abdomen: Soft, obese, nontender, nondistended, + bowel sounds  Extremities: warm dry without cyanosis clubbing or edema  Neuro: AAOx3, nonfocal  Psych: Normal affect and demeanor with intact judgement and insight   Data Reviewed: I have personally reviewed following labs and imaging studies  CBC: Recent Labs  Lab 11/06/17 1726 11/07/17 0634  WBC 11.8* 6.3  NEUTROABS 9.7*  --   HGB 12.3 10.3*  HCT 35.4* 30.6*  MCV 91.9 92.7  PLT 274 329   Basic Metabolic Panel: Recent Labs  Lab 11/06/17 1726 11/07/17 0634  NA 126* 130*  K 4.5 4.6  CL 96* 104  CO2 20* 19*  GLUCOSE 88 97  BUN 34* 21*  CREATININE 1.22* 0.89  CALCIUM 9.9 9.0   GFR: Estimated Creatinine Clearance: 66.5 mL/min (by C-G formula based on SCr of 0.89 mg/dL). Liver Function Tests: No results for input(s): AST, ALT, ALKPHOS, BILITOT, PROT, ALBUMIN in the last 168 hours. No results for input(s): LIPASE, AMYLASE in the last 168 hours. No results for input(s): AMMONIA in the last 168 hours. Coagulation Profile: No results for input(s): INR, PROTIME in the last 168 hours. Cardiac Enzymes: No results for input(s): CKTOTAL, CKMB, CKMBINDEX, TROPONINI in the last 168 hours. BNP (last 3 results) No results for input(s): PROBNP in the last 8760 hours. HbA1C: No results for input(s): HGBA1C in the last 72 hours. CBG: No results for input(s): GLUCAP in the last 168 hours. Lipid Profile: No results for input(s): CHOL, HDL, LDLCALC, TRIG, CHOLHDL, LDLDIRECT in the last 72 hours. Thyroid Function Tests: No results for input(s): TSH, T4TOTAL, FREET4, T3FREE, THYROIDAB in the last 72 hours. Anemia Panel: No results for input(s): VITAMINB12, FOLATE, FERRITIN, TIBC, IRON, RETICCTPCT  in the last 72 hours. Urine analysis:    Component Value Date/Time   COLORURINE YELLOW 11/06/2017 1726   APPEARANCEUR CLEAR 11/06/2017 1726   LABSPEC 1.010 11/06/2017 1726   PHURINE 6.0 11/06/2017 1726   GLUCOSEU NEGATIVE 11/06/2017 1726   GLUCOSEU NEGATIVE 02/14/2012 1524   HGBUR NEGATIVE 11/06/2017 1726   BILIRUBINUR NEGATIVE 11/06/2017 1726   KETONESUR 5 (A) 11/06/2017 1726   PROTEINUR NEGATIVE 11/06/2017 1726   UROBILINOGEN 0.2 02/23/2012 1623   NITRITE NEGATIVE 11/06/2017 1726   LEUKOCYTESUR NEGATIVE 11/06/2017 1726   Sepsis Labs: @LABRCNTIP (procalcitonin:4,lacticidven:4)  )No results found for this or any previous visit (from the past 240 hour(s)).    Radiology Studies: No results found.   Scheduled Meds: . aspirin EC  81 mg Oral Daily  . clidinium-chlordiazePOXIDE  2 capsule Oral QHS  . clonazePAM  1 mg Oral QHS  . diphenhydrAMINE  25 mg Oral QHS  . docusate sodium  100 mg Oral BID  .  enoxaparin (LOVENOX) injection  40 mg Subcutaneous Q24H  . metaxalone  400 mg Oral QHS  . pantoprazole  40 mg Oral Daily   Continuous Infusions: . sodium chloride 75 mL/hr at 11/07/17 0901     LOS: 0 days   Time Spent in minutes   30 minutes  Malaiah Viramontes D.O. on 11/07/2017 at 2:09 PM  Between 7am to 7pm - Pager - [phone]  After 7pm go to [email or web address] - password [email or web address]  And look for the night coverage person covering for me after hours  [facility] Office  [phone]

## 2017-11-08 ENCOUNTER — Other Ambulatory Visit (INDEPENDENT_AMBULATORY_CARE_PROVIDER_SITE_OTHER): Payer: Self-pay | Admitting: Physician Assistant

## 2017-11-11 DIAGNOSIS — Z09 Encounter for follow-up examination after completed treatment for conditions other than malignant neoplasm: Secondary | ICD-10-CM | POA: Diagnosis not present

## 2017-11-11 DIAGNOSIS — E871 Hypo-osmolality and hyponatremia: Secondary | ICD-10-CM | POA: Diagnosis not present

## 2017-11-12 DIAGNOSIS — E875 Hyperkalemia: Secondary | ICD-10-CM | POA: Diagnosis not present

## 2017-11-14 DIAGNOSIS — K760 Fatty (change of) liver, not elsewhere classified: Secondary | ICD-10-CM | POA: Diagnosis not present

## 2017-11-14 DIAGNOSIS — E871 Hypo-osmolality and hyponatremia: Secondary | ICD-10-CM | POA: Diagnosis not present

## 2017-11-14 DIAGNOSIS — G2581 Restless legs syndrome: Secondary | ICD-10-CM | POA: Diagnosis not present

## 2017-11-14 DIAGNOSIS — Z8679 Personal history of other diseases of the circulatory system: Secondary | ICD-10-CM | POA: Diagnosis not present

## 2017-11-19 ENCOUNTER — Ambulatory Visit (INDEPENDENT_AMBULATORY_CARE_PROVIDER_SITE_OTHER): Payer: Medicare Other | Admitting: Physician Assistant

## 2017-11-19 VITALS — BP 111/73 | HR 82 | Temp 98.1°F | Ht 63.0 in | Wt 199.0 lb

## 2017-11-19 DIAGNOSIS — E559 Vitamin D deficiency, unspecified: Secondary | ICD-10-CM

## 2017-11-19 DIAGNOSIS — E88819 Insulin resistance, unspecified: Secondary | ICD-10-CM

## 2017-11-19 DIAGNOSIS — E66812 Obesity, class 2: Secondary | ICD-10-CM

## 2017-11-19 DIAGNOSIS — E8881 Metabolic syndrome: Secondary | ICD-10-CM

## 2017-11-19 DIAGNOSIS — Z6835 Body mass index (BMI) 35.0-35.9, adult: Secondary | ICD-10-CM

## 2017-11-19 MED ORDER — VITAMIN D (ERGOCALCIFEROL) 1.25 MG (50000 UNIT) PO CAPS: 50000.0000 [IU] | ORAL_CAPSULE | ORAL | 0 refills | Status: DC

## 2017-11-19 MED ORDER — METFORMIN HCL 500 MG PO TABS: 500.0000 mg | ORAL_TABLET | Freq: Every day | ORAL | 0 refills | Status: DC

## 2017-11-19 NOTE — Progress Notes (Signed)
Office: [phone]  /  Fax: [phone]   HPI:   Chief Complaint: OBESITY Jamie [name] is here to discuss her progress with her obesity treatment plan. She is on the Pescatarian eating plan and is following her eating plan approximately 80 % of the time. She states she is exercising 0 minutes 0 times per week. Jamie [name] continues to do well with weight loss. She was recently hospitalized with hypotension and states she feels well now. Her weight is 199 lb (90.3 kg) today and has had a weight loss of 5 pounds over a period of 2 weeks since her last visit. She has lost 23 lbs since starting treatment with [name].  Vitamin D deficiency Jamie [name] has a diagnosis of vitamin D deficiency. She is currently taking vit D and denies nausea, vomiting or muscle weakness.  Insulin Resistance Jamie [name] has a diagnosis of insulin resistance based on her elevated fasting insulin level >5. Although Jamie [name]'s blood glucose readings are still under good control, insulin resistance puts her at greater risk of metabolic syndrome and diabetes. She is taking metformin currently and continues to work on diet and exercise to decrease risk of diabetes. She denies polyphagia.  ALLERGIES: Allergies  Allergen Reactions  . Buprenorphine Hcl Rash and Shortness Of Breath  . Morphine And Related Shortness Of Breath and Rash  . Morphine And Related Shortness Of Breath  . Aspirin Other (See Comments)    REACTION: ulcers  Can take 81 mg  . Avelox [Moxifloxacin Hcl In Nacl] Hives  . Diflucan [Fluconazole] Hives    Whelps and rash c skin peeling  . Diflucan [Fluconazole] Hives  . Latex Rash  . Moxifloxacin Palpitations    Tachycardia     MEDICATIONS: Current Outpatient Medications on File Prior to Visit  Medication Sig Dispense Refill  . aspirin EC 81 MG tablet Take 1 tablet (81 mg total) by mouth daily. 90 tablet 3  . BIOTIN PO Take 1 capsule by mouth at bedtime.     . clidinium-chlordiazePOXIDE (LIBRAX) 5-2.5 MG capsule Take 1  capsule by mouth 2 (two) times daily. (Patient taking differently: Take 2 capsules by mouth at bedtime. ) 180 capsule 3  . clonazePAM (KLONOPIN) 1 MG tablet Take 1 mg by mouth at bedtime.     . conjugated estrogens (PREMARIN) vaginal cream Place 1 Applicatorful vaginally every other day.     . diphenhydrAMINE (BENADRYL) 25 MG tablet Take 25 mg by mouth at bedtime.     Marland Kitchen esomeprazole (NEXIUM) 40 MG capsule TAKE 1 CAPSULE TWICE A DAY BEFORE MEALS 180 capsule 3  . fluticasone (FLONASE) 50 MCG/ACT nasal spray Place 2 sprays into the nose daily as needed for rhinitis or allergies.    Marland Kitchen linaclotide (LINZESS) 145 MCG CAPS capsule Take 1 capsule (145 mcg total) by mouth daily. (Patient taking differently: Take 145 mcg by mouth daily as needed (cramping). ) 90 capsule 3  . metaxalone (SKELAXIN) 800 MG tablet Take 400 mg by mouth at bedtime.     . metFORMIN (GLUCOPHAGE) 500 MG tablet Take 1 tablet (500 mg total) by mouth daily with breakfast. 30 tablet 0  . Potassium 99 MG TABS Take 1 tablet by mouth daily.    . Prenatal Vit-Fe Fumarate-FA (PRENATAL MULTIVITAMIN) TABS tablet Take 1 tablet by mouth daily at 12 noon.    . Probiotic Product (PROBIOTIC-10 PO) Take 1 capsule by mouth daily.    . Vitamin D, Ergocalciferol, (DRISDOL) 50000 units CAPS capsule Take 1 capsule (50,000 Units total) by mouth  every 7 (seven) days. 4 capsule 0   No current facility-administered medications on file prior to visit.     PAST MEDICAL HISTORY: Past Medical History:  Diagnosis Date  . Allergic rhinitis   . Anxiety   . Arthritis    left foot  . Asthma   . Back pain   . Colon polyps    adenomatous  . Constipation   . Diverticulosis   . Dysrhythmia 1999   Dr Johnsie Cancel 2003  . Elevated LFTs   . Fatty liver disease, nonalcoholic   . GERD (gastroesophageal reflux disease)   . Headache(784.0)    migraines with vision changes but no pain  . Heart murmur   . Hypertension   . IBS (irritable bowel syndrome)   . Joint  pain   . Migraines   . Muscle cramps   . Obesity   . Palpitations   . Pneumonia 2007  . Renal cell carcinoma ([place]) 2013   minimal partial nephrectomy  . Restless leg syndrome   . Sleep apnea    moderate; wears CPAP, uncertain about setting  . TIA (transient ischemic attack) 09/11/2014  . Ulcer   . Urinary incontinence   . Vitamin D deficiency     PAST SURGICAL HISTORY: Past Surgical History:  Procedure Laterality Date  . BREAST REDUCTION SURGERY  Jan 1991  . ELBOW SURGERY Left   . KIDNEY SURGERY Right October 2013    Tumor removed   . TONSILLECTOMY    . VAGINAL HYSTERECTOMY  1979   partial    SOCIAL HISTORY: Social History   Tobacco Use  . Smoking status: Never Smoker  . Smokeless tobacco: Never Used  Substance Use Topics  . Alcohol use: Yes    Alcohol/week: 1.2 - 1.8 oz    Types: 1 Shots of liquor, 1 - 2 Glasses of wine per week  . Drug use: No    FAMILY HISTORY: Family History  Problem Relation Age of Onset  . Heart disease Father 36  . Lung cancer Father        smoker  . Hypertension Mother   . Anxiety disorder Mother   . Heart disease Brother   . Hypertension Other   . Colon cancer Cousin   . Allergies Daughter   . Allergies Brother   . Melanoma Daughter   . Liver disease Neg Hx   . Asthma Neg Hx   . Esophageal cancer Neg Hx   . Rectal cancer Neg Hx   . Stomach cancer Neg Hx     ROS: Review of Systems  Constitutional: Positive for weight loss.  Gastrointestinal: Negative for nausea and vomiting.  Musculoskeletal:       Negative for muscle weakness  Endo/Heme/Allergies:       Negative for polyphagia    PHYSICAL EXAM: Blood pressure 111/73, pulse 82, temperature 98.1 F (36.7 C), temperature source Oral, height 5' 3"  (1.6 m), weight 199 lb (90.3 kg), SpO2 96 %. Body mass index is 35.25 kg/m. Physical Exam  Constitutional: She is oriented to person, place, and time. She appears well-developed and well-nourished.  Cardiovascular: Normal  rate.  Pulmonary/Chest: Effort normal.  Musculoskeletal: Normal range of motion.  Neurological: She is oriented to person, place, and time.  Skin: Skin is warm and dry.  Psychiatric: She has a normal mood and affect. Her behavior is normal.  Vitals reviewed.   RECENT LABS AND TESTS: BMET    Component Value Date/Time   NA 134 (L) 11/07/2017 1426  NA 136 08/25/2017 1047   K 4.6 11/07/2017 0634   CL 104 11/07/2017 0634   CO2 19 (L) 11/07/2017 0634   GLUCOSE 97 11/07/2017 0634   BUN 21 (H) 11/07/2017 0634   BUN 15 08/25/2017 1047   CREATININE 0.89 11/07/2017 0634   CALCIUM 9.0 11/07/2017 0634   GFRNONAA >60 11/07/2017 0634   GFRAA >60 11/07/2017 0634   Lab Results  Component Value Date   HGBA1C 5.4 08/25/2017   HGBA1C 5.4 12/16/2011   HGBA1C 5.7 05/03/2011   HGBA1C 5.5 02/15/2010   HGBA1C 5.5 01/13/2009   Lab Results  Component Value Date   INSULIN 50.3 (H) 08/25/2017   CBC    Component Value Date/Time   WBC 6.3 11/07/2017 0634   RBC 3.30 (L) 11/07/2017 0634   HGB 10.3 (L) 11/07/2017 0634   HGB 12.7 08/25/2017 1047   HCT 30.6 (L) 11/07/2017 0634   HCT 37.0 08/25/2017 1047   PLT 196 11/07/2017 0634   MCV 92.7 11/07/2017 0634   MCV 93 08/25/2017 1047   MCH 31.2 11/07/2017 0634   MCHC 33.7 11/07/2017 0634   RDW 12.8 11/07/2017 0634   RDW 13.2 08/25/2017 1047   LYMPHSABS 1.4 11/06/2017 1726   LYMPHSABS 1.2 08/25/2017 1047   MONOABS 0.6 11/06/2017 1726   EOSABS 0.1 11/06/2017 1726   EOSABS 0.1 08/25/2017 1047   BASOSABS 0.0 11/06/2017 1726   BASOSABS 0.0 08/25/2017 1047   Iron/TIBC/Ferritin/ %Sat No results found for: IRON, TIBC, FERRITIN, IRONPCTSAT Lipid Panel     Component Value Date/Time   CHOL 176 08/25/2017 1047   TRIG 91 08/25/2017 1047   HDL 84 08/25/2017 1047   CHOLHDL 3 08/24/2010 1134   VLDL 17.0 08/24/2010 1134   LDLCALC 74 08/25/2017 1047   Hepatic Function Panel     Component Value Date/Time   PROT 7.0 08/25/2017 1047   ALBUMIN 4.6  08/25/2017 1047   AST 35 08/25/2017 1047   ALT 73 (H) 08/25/2017 1047   ALKPHOS 73 08/25/2017 1047   BILITOT 0.3 08/25/2017 1047   BILIDIR 0.2 08/24/2010 1134      Component Value Date/Time   TSH 1.900 08/25/2017 1047   TSH 1.12 05/03/2011 1239   TSH 1.34 08/24/2010 1134   Results for Cocozza, Sary D "[name]" (MRN [id number]) as of 11/19/2017 11:26  Ref. Range 08/25/2017 10:47  Vitamin D, 25-Hydroxy Latest Ref Range: 30.0 - 100.0 ng/mL 38.3   ASSESSMENT AND PLAN: Vitamin D deficiency - Plan: Vitamin D, Ergocalciferol, (DRISDOL) 50000 units CAPS capsule  Insulin resistance - Plan: metFORMIN (GLUCOPHAGE) 500 MG tablet  Class 2 severe obesity with serious comorbidity and body mass index (BMI) of 35.0 to 35.9 in adult, unspecified obesity type (HCC)  PLAN:  Vitamin D Deficiency Jamie [name] was informed that low vitamin D levels contributes to fatigue and are associated with obesity, breast, and colon cancer. She agrees to continue to take prescription Vit D @50 ,000 IU every week #4 with no refills and will follow up for routine testing of vitamin D, at least 2-3 times per year. She was informed of the risk of over-replacement of vitamin D and agrees to not increase her dose unless she discusses this with [name] first. Jamie [name] agrees to follow up with our clinic in 3 weeks.  Insulin Resistance Jamie [name] will continue to work on weight loss, exercise, and decreasing simple carbohydrates in her diet to help decrease the risk of diabetes. We dicussed metformin including benefits and risks. She was informed that eating too  many simple carbohydrates or too many calories at one sitting increases the likelihood of GI side effects. Jamie [name] requested metformin for now and prescription was written today for 1 month refill. Jamie [name] agreed to follow up with [name] as directed to monitor her progress.  Obesity Jamie [name] is currently in the action stage of change. As such, her goal is to continue with weight loss efforts She  has agreed to follow the Category 2 plan +100 calories Jamie [name] has been instructed to work up to a goal of 150 minutes of combined cardio and strengthening exercise per week for weight loss and overall health benefits. We discussed the following Behavioral Modification Strategies today: increasing lean protein intake and work on meal planning and easy cooking plans  Jamie [name] has agreed to follow up with our clinic in 3 weeks. She was informed of the importance of frequent follow up visits to maximize her success with intensive lifestyle modifications for her multiple health conditions.   OBESITY BEHAVIORAL INTERVENTION VISIT  Today's visit was # 7 out of 22.  Starting weight: 222 lbs Starting date: 08/25/17 Today's weight : 199 lbs Today's date: 11/19/2017 Total lbs lost to date: 23 (Patients must lose 7 lbs in the first 6 months to continue with counseling)   ASK: We discussed the diagnosis of obesity with Jamie [name] today and Jamie [name] agreed to give [name] permission to discuss obesity behavioral modification therapy today.  ASSESS: Jamie [name] has the diagnosis of obesity and her BMI today is 35.26 Jamie [name] is in the action stage of change   ADVISE: Jamie [name] was educated on the multiple health risks of obesity as well as the benefit of weight loss to improve her health. She was advised of the need for long term treatment and the importance of lifestyle modifications.  AGREE: Multiple dietary modification options and treatment options were discussed and  Jamie [name] agreed to the above obesity treatment plan.   Corey Skains, am acting as transcriptionist for [employer], PA-C I, [facility], have reviewed this note and agree with its content

## 2017-11-21 DIAGNOSIS — Z1231 Encounter for screening mammogram for malignant neoplasm of breast: Secondary | ICD-10-CM | POA: Diagnosis not present

## 2017-12-11 ENCOUNTER — Ambulatory Visit (INDEPENDENT_AMBULATORY_CARE_PROVIDER_SITE_OTHER): Payer: Medicare Other | Admitting: Physician Assistant

## 2017-12-11 VITALS — BP 110/74 | HR 84 | Temp 98.6°F | Ht 63.0 in | Wt 191.0 lb

## 2017-12-11 DIAGNOSIS — I10 Essential (primary) hypertension: Secondary | ICD-10-CM | POA: Diagnosis not present

## 2017-12-11 DIAGNOSIS — E669 Obesity, unspecified: Secondary | ICD-10-CM | POA: Diagnosis not present

## 2017-12-11 DIAGNOSIS — Z6833 Body mass index (BMI) 33.0-33.9, adult: Secondary | ICD-10-CM | POA: Diagnosis not present

## 2017-12-11 NOTE — Progress Notes (Signed)
Office: [phone]  /  Fax: [phone]   HPI:   Chief Complaint: OBESITY Jamie [name] is here to discuss her progress with her obesity treatment plan. She is on the Category 2 plan +100 and is following her eating plan approximately 98 % of the time. She states she is walking for 15 minutes 3 times per week. Jamie [name] continues to do well with weight loss. She is mindful of her eating and does well with portion control when she eats out. Her weight is 191 lb (86.6 kg) today and has had a weight loss of 8 pounds over a period of 3 weeks since her last visit. She has lost 31 lbs since starting treatment with [name].  Hypertension Jamie [name] is a 72 y.o. female with hypertension and is on Spironolactone. Jamie [name] denies chest pain or shortness of breath on exertion. She is working weight loss to help control her blood pressure with the goal of decreasing her risk of heart attack and stroke. Addies blood pressure is stable.  ALLERGIES: Allergies  Allergen Reactions  . Buprenorphine Hcl Rash and Shortness Of Breath  . Morphine And Related Shortness Of Breath and Rash  . Morphine And Related Shortness Of Breath  . Aspirin Other (See Comments)    REACTION: ulcers  Can take 81 mg  . Avelox [Moxifloxacin Hcl In Nacl] Hives  . Diflucan [Fluconazole] Hives    Whelps and rash c skin peeling  . Diflucan [Fluconazole] Hives  . Latex Rash  . Moxifloxacin Palpitations    Tachycardia     MEDICATIONS: Current Outpatient Medications on File Prior to Visit  Medication Sig Dispense Refill  . aspirin EC 81 MG tablet Take 1 tablet (81 mg total) by mouth daily. 90 tablet 3  . BIOTIN PO Take 1 capsule by mouth at bedtime.     . clidinium-chlordiazePOXIDE (LIBRAX) 5-2.5 MG capsule Take 1 capsule by mouth 2 (two) times daily. (Patient taking differently: Take 2 capsules by mouth at bedtime. ) 180 capsule 3  . clonazePAM (KLONOPIN) 1 MG tablet Take 1 mg by mouth at bedtime.     . conjugated  estrogens (PREMARIN) vaginal cream Place 1 Applicatorful vaginally every other day.     . diphenhydrAMINE (BENADRYL) 25 MG tablet Take 25 mg by mouth at bedtime.     Marland Kitchen esomeprazole (NEXIUM) 40 MG capsule TAKE 1 CAPSULE TWICE A DAY BEFORE MEALS 180 capsule 3  . fluticasone (FLONASE) 50 MCG/ACT nasal spray Place 2 sprays into the nose daily as needed for rhinitis or allergies.    Marland Kitchen linaclotide (LINZESS) 145 MCG CAPS capsule Take 1 capsule (145 mcg total) by mouth daily. (Patient taking differently: Take 145 mcg by mouth daily as needed (cramping). ) 90 capsule 3  . metaxalone (SKELAXIN) 800 MG tablet Take 400 mg by mouth at bedtime.     . metFORMIN (GLUCOPHAGE) 500 MG tablet Take 1 tablet (500 mg total) by mouth daily with breakfast. 30 tablet 0  . Potassium 99 MG TABS Take 1 tablet by mouth daily.    . Prenatal Vit-Fe Fumarate-FA (PRENATAL MULTIVITAMIN) TABS tablet Take 1 tablet by mouth daily at 12 noon.    . Probiotic Product (PROBIOTIC-10 PO) Take 1 capsule by mouth daily.    . Vitamin D, Ergocalciferol, (DRISDOL) 50000 units CAPS capsule Take 1 capsule (50,000 Units total) by mouth every 7 (seven) days. 4 capsule 0   No current facility-administered medications on file prior to visit.     PAST MEDICAL  HISTORY: Past Medical History:  Diagnosis Date  . Allergic rhinitis   . Anxiety   . Arthritis    left foot  . Asthma   . Back pain   . Colon polyps    adenomatous  . Constipation   . Diverticulosis   . Dysrhythmia 1999   Dr Johnsie Cancel 2003  . Elevated LFTs   . Fatty liver disease, nonalcoholic   . GERD (gastroesophageal reflux disease)   . Headache(784.0)    migraines with vision changes but no pain  . Heart murmur   . Hypertension   . IBS (irritable bowel syndrome)   . Joint pain   . Migraines   . Muscle cramps   . Obesity   . Palpitations   . Pneumonia 2007  . Renal cell carcinoma ([place]) 2013   minimal partial nephrectomy  . Restless leg syndrome   . Sleep apnea     moderate; wears CPAP, uncertain about setting  . TIA (transient ischemic attack) 09/11/2014  . Ulcer   . Urinary incontinence   . Vitamin D deficiency     PAST SURGICAL HISTORY: Past Surgical History:  Procedure Laterality Date  . BREAST REDUCTION SURGERY  Jan 1991  . ELBOW SURGERY Left   . KIDNEY SURGERY Right October 2013    Tumor removed   . TONSILLECTOMY    . VAGINAL HYSTERECTOMY  1979   partial    SOCIAL HISTORY: Social History   Tobacco Use  . Smoking status: Never Smoker  . Smokeless tobacco: Never Used  Substance Use Topics  . Alcohol use: Yes    Alcohol/week: 1.2 - 1.8 oz    Types: 1 Shots of liquor, 1 - 2 Glasses of wine per week  . Drug use: No    FAMILY HISTORY: Family History  Problem Relation Age of Onset  . Heart disease Father 22  . Lung cancer Father        smoker  . Hypertension Mother   . Anxiety disorder Mother   . Heart disease Brother   . Hypertension Other   . Colon cancer Cousin   . Allergies Daughter   . Allergies Brother   . Melanoma Daughter   . Liver disease Neg Hx   . Asthma Neg Hx   . Esophageal cancer Neg Hx   . Rectal cancer Neg Hx   . Stomach cancer Neg Hx     ROS: Review of Systems  Constitutional: Positive for weight loss.  Respiratory: Negative for shortness of breath (on exertion).   Cardiovascular: Negative for chest pain.    PHYSICAL EXAM: Blood pressure 110/74, pulse 84, temperature 98.6 F (37 C), temperature source Oral, height 5' 3"  (1.6 m), weight 191 lb (86.6 kg), SpO2 96 %. Body mass index is 33.83 kg/m. Physical Exam  Constitutional: She is oriented to person, place, and time. She appears well-developed and well-nourished.  Cardiovascular: Normal rate.  Pulmonary/Chest: Effort normal.  Musculoskeletal: Normal range of motion.  Neurological: She is oriented to person, place, and time.  Skin: Skin is warm and dry.  Psychiatric: She has a normal mood and affect. Her behavior is normal.  Vitals  reviewed.   RECENT LABS AND TESTS: BMET    Component Value Date/Time   NA 134 (L) 11/07/2017 1426   NA 136 08/25/2017 1047   K 4.6 11/07/2017 0634   CL 104 11/07/2017 0634   CO2 19 (L) 11/07/2017 0634   GLUCOSE 97 11/07/2017 0634   BUN 21 (H) 11/07/2017 4128  BUN 15 08/25/2017 1047   CREATININE 0.89 11/07/2017 0634   CALCIUM 9.0 11/07/2017 0634   GFRNONAA >60 11/07/2017 0634   GFRAA >60 11/07/2017 0634   Lab Results  Component Value Date   HGBA1C 5.4 08/25/2017   HGBA1C 5.4 12/16/2011   HGBA1C 5.7 05/03/2011   HGBA1C 5.5 02/15/2010   HGBA1C 5.5 01/13/2009   Lab Results  Component Value Date   INSULIN 50.3 (H) 08/25/2017   CBC    Component Value Date/Time   WBC 6.3 11/07/2017 0634   RBC 3.30 (L) 11/07/2017 0634   HGB 10.3 (L) 11/07/2017 0634   HGB 12.7 08/25/2017 1047   HCT 30.6 (L) 11/07/2017 0634   HCT 37.0 08/25/2017 1047   PLT 196 11/07/2017 0634   MCV 92.7 11/07/2017 0634   MCV 93 08/25/2017 1047   MCH 31.2 11/07/2017 0634   MCHC 33.7 11/07/2017 0634   RDW 12.8 11/07/2017 0634   RDW 13.2 08/25/2017 1047   LYMPHSABS 1.4 11/06/2017 1726   LYMPHSABS 1.2 08/25/2017 1047   MONOABS 0.6 11/06/2017 1726   EOSABS 0.1 11/06/2017 1726   EOSABS 0.1 08/25/2017 1047   BASOSABS 0.0 11/06/2017 1726   BASOSABS 0.0 08/25/2017 1047   Iron/TIBC/Ferritin/ %Sat No results found for: IRON, TIBC, FERRITIN, IRONPCTSAT Lipid Panel     Component Value Date/Time   CHOL 176 08/25/2017 1047   TRIG 91 08/25/2017 1047   HDL 84 08/25/2017 1047   CHOLHDL 3 08/24/2010 1134   VLDL 17.0 08/24/2010 1134   LDLCALC 74 08/25/2017 1047   Hepatic Function Panel     Component Value Date/Time   PROT 7.0 08/25/2017 1047   ALBUMIN 4.6 08/25/2017 1047   AST 35 08/25/2017 1047   ALT 73 (H) 08/25/2017 1047   ALKPHOS 73 08/25/2017 1047   BILITOT 0.3 08/25/2017 1047   BILIDIR 0.2 08/24/2010 1134      Component Value Date/Time   TSH 1.900 08/25/2017 1047   TSH 1.12 05/03/2011 1239    TSH 1.34 08/24/2010 1134   Results for Mccambridge, Ladon D "[name]" (MRN [id number]) as of 12/11/2017 10:36  Ref. Range 08/25/2017 10:47  Vitamin D, 25-Hydroxy Latest Ref Range: 30.0 - 100.0 ng/mL 38.3   ASSESSMENT AND PLAN: Essential hypertension  Class 1 obesity with serious comorbidity and body mass index (BMI) of 33.0 to 33.9 in adult, unspecified obesity type  PLAN:  Hypertension We discussed sodium restriction, working on healthy weight loss, and a regular exercise program as the means to achieve improved blood pressure control. Perla agreed with this plan and agreed to follow up as directed. We will continue to monitor her blood pressure as well as her progress with the above lifestyle modifications. She will continue her medications as prescribed and will watch for signs of hypotension as she continues her lifestyle modifications.  We spent > than 50% of the 15 minute visit on the counseling as documented in the note.  Obesity Shekira is currently in the action stage of change. As such, her goal is to continue with weight loss efforts She has agreed to follow the Category 2 plan Zeda has been instructed to work up to a goal of 150 minutes of combined cardio and strengthening exercise per week for weight loss and overall health benefits. We discussed the following Behavioral Modification Strategies today: increasing lean protein intake and work on meal planning and easy cooking plans  Brion has agreed to follow up with our clinic in 2 weeks. She was informed of the importance  of frequent follow up visits to maximize her success with intensive lifestyle modifications for her multiple health conditions.   OBESITY BEHAVIORAL INTERVENTION VISIT  Today's visit was # 8 out of 22.  Starting weight: 222 lbs Starting date: 08/25/17 Today's weight : 191 lbs Today's date: 12/11/2017 Total lbs lost to date: 11 (Patients must lose 7 lbs in the first 6 months to continue with  counseling)   ASK: We discussed the diagnosis of obesity with Quita Skye today and Adrieanna agreed to give [name] permission to discuss obesity behavioral modification therapy today.  ASSESS: Sabre has the diagnosis of obesity and her BMI today is 33.84 Shardai is in the action stage of change   ADVISE: Georgetta was educated on the multiple health risks of obesity as well as the benefit of weight loss to improve her health. She was advised of the need for long term treatment and the importance of lifestyle modifications.  AGREE: Multiple dietary modification options and treatment options were discussed and  Preslynn agreed to the above obesity treatment plan.   Corey Skains, am acting as transcriptionist for [employer], PA-C I, [facility], have reviewed this note and agree with its content

## 2017-12-24 DIAGNOSIS — R112 Nausea with vomiting, unspecified: Secondary | ICD-10-CM | POA: Diagnosis not present

## 2017-12-25 ENCOUNTER — Ambulatory Visit (INDEPENDENT_AMBULATORY_CARE_PROVIDER_SITE_OTHER): Payer: Medicare Other | Admitting: Physician Assistant

## 2017-12-25 VITALS — BP 107/71 | HR 80 | Temp 98.3°F | Ht 63.0 in | Wt 186.0 lb

## 2017-12-25 DIAGNOSIS — E8881 Metabolic syndrome: Secondary | ICD-10-CM | POA: Diagnosis not present

## 2017-12-25 DIAGNOSIS — E669 Obesity, unspecified: Secondary | ICD-10-CM | POA: Diagnosis not present

## 2017-12-25 DIAGNOSIS — E559 Vitamin D deficiency, unspecified: Secondary | ICD-10-CM | POA: Diagnosis not present

## 2017-12-25 DIAGNOSIS — Z6833 Body mass index (BMI) 33.0-33.9, adult: Secondary | ICD-10-CM | POA: Diagnosis not present

## 2017-12-25 MED ORDER — VITAMIN D (ERGOCALCIFEROL) 1.25 MG (50000 UNIT) PO CAPS: 50000.0000 [IU] | ORAL_CAPSULE | ORAL | 0 refills | Status: DC

## 2017-12-25 MED ORDER — METFORMIN HCL 500 MG PO TABS: 500.0000 mg | ORAL_TABLET | Freq: Every day | ORAL | 0 refills | Status: DC

## 2017-12-25 NOTE — Progress Notes (Signed)
Office: [phone]  /  Fax: [phone]   HPI:   Chief Complaint: OBESITY Jamie [name] is here to discuss her progress with her obesity treatment plan. She is on the Category 2 plan and is following her eating plan approximately 98 % of the time. She states she is walking for 15-20 minutes 4 times per week. Jamie [name] continues to well with weight loss. She makes better food choices when she eats out and is very pleased with her weight loss.  Her weight is 191 lb (86.6 kg) today and has had a weight loss of 5 pounds over a period of 2 weeks since her last visit. She has lost 36 lbs since starting treatment with [name].  Insulin Resistance Jamie [name] has a diagnosis of insulin resistance based on her elevated fasting insulin level >5. Although Jamie [name]'s blood glucose readings are still under good control, insulin resistance puts her at greater risk of metabolic syndrome and diabetes. She is taking metformin currently and continues to work on diet and exercise to decrease risk of diabetes. She denies polyphagia.  Vitamin D Deficiency Jamie [name] has a diagnosis of vitamin D deficiency. She is currently taking prescription Vit D and denies nausea, vomiting or muscle weakness.  ALLERGIES: Allergies  Allergen Reactions  . Buprenorphine Hcl Rash and Shortness Of Breath  . Morphine And Related Shortness Of Breath and Rash  . Morphine And Related Shortness Of Breath  . Aspirin Other (See Comments)    REACTION: ulcers  Can take 81 mg  . Avelox [Moxifloxacin Hcl In Nacl] Hives  . Diflucan [Fluconazole] Hives    Jamie [name] and rash c skin peeling  . Diflucan [Fluconazole] Hives  . Latex Rash  . Moxifloxacin Palpitations    Tachycardia     MEDICATIONS: Current Outpatient Medications on File Prior to Visit  Medication Sig Dispense Refill  . aspirin EC 81 MG tablet Take 1 tablet (81 mg total) by mouth daily. 90 tablet 3  . BIOTIN PO Take 1 capsule by mouth at bedtime.     . clidinium-chlordiazePOXIDE (LIBRAX) 5-2.5  MG capsule Take 1 capsule by mouth 2 (two) times daily. (Patient taking differently: Take 2 capsules by mouth at bedtime. ) 180 capsule 3  . clonazePAM (KLONOPIN) 1 MG tablet Take 1 mg by mouth at bedtime.     . conjugated estrogens (PREMARIN) vaginal cream Place 1 Applicatorful vaginally every other day.     . diphenhydrAMINE (BENADRYL) 25 MG tablet Take 25 mg by mouth at bedtime.     Marland Kitchen esomeprazole (NEXIUM) 40 MG capsule TAKE 1 CAPSULE TWICE A DAY BEFORE MEALS 180 capsule 3  . fluticasone (FLONASE) 50 MCG/ACT nasal spray Place 2 sprays into the nose daily as needed for rhinitis or allergies.    Marland Kitchen linaclotide (LINZESS) 145 MCG CAPS capsule Take 1 capsule (145 mcg total) by mouth daily. (Patient taking differently: Take 145 mcg by mouth daily as needed (cramping). ) 90 capsule 3  . metaxalone (SKELAXIN) 800 MG tablet Take 400 mg by mouth at bedtime.     . Potassium 99 MG TABS Take 1 tablet by mouth daily.    . Prenatal Vit-Fe Fumarate-FA (PRENATAL MULTIVITAMIN) TABS tablet Take 1 tablet by mouth daily at 12 noon.    . Probiotic Product (PROBIOTIC-10 PO) Take 1 capsule by mouth daily.     No current facility-administered medications on file prior to visit.     PAST MEDICAL HISTORY: Past Medical History:  Diagnosis Date  . Allergic rhinitis   . Anxiety   .  Arthritis    left foot  . Asthma   . Back pain   . Colon polyps    adenomatous  . Constipation   . Diverticulosis   . Dysrhythmia 1999   Dr Johnsie Cancel 2003  . Elevated LFTs   . Fatty liver disease, nonalcoholic   . GERD (gastroesophageal reflux disease)   . Headache(784.0)    migraines with vision changes but no pain  . Heart murmur   . Hypertension   . IBS (irritable bowel syndrome)   . Joint pain   . Migraines   . Muscle cramps   . Obesity   . Palpitations   . Pneumonia 2007  . Renal cell carcinoma ([place]) 2013   minimal partial nephrectomy  . Restless leg syndrome   . Sleep apnea    moderate; wears CPAP, uncertain about  setting  . TIA (transient ischemic attack) 09/11/2014  . Ulcer   . Urinary incontinence   . Vitamin D deficiency     PAST SURGICAL HISTORY: Past Surgical History:  Procedure Laterality Date  . BREAST REDUCTION SURGERY  Jan 1991  . ELBOW SURGERY Left   . KIDNEY SURGERY Right October 2013    Tumor removed   . TONSILLECTOMY    . VAGINAL HYSTERECTOMY  1979   partial    SOCIAL HISTORY: Social History   Tobacco Use  . Smoking status: Never Smoker  . Smokeless tobacco: Never Used  Substance Use Topics  . Alcohol use: Yes    Alcohol/week: 1.2 - 1.8 oz    Types: 1 Shots of liquor, 1 - 2 Glasses of wine per week  . Drug use: No    FAMILY HISTORY: Family History  Problem Relation Age of Onset  . Heart disease Father 75  . Lung cancer Father        smoker  . Hypertension Mother   . Anxiety disorder Mother   . Heart disease Brother   . Hypertension Other   . Colon cancer Cousin   . Allergies Daughter   . Allergies Brother   . Melanoma Daughter   . Liver disease Neg Hx   . Asthma Neg Hx   . Esophageal cancer Neg Hx   . Rectal cancer Neg Hx   . Stomach cancer Neg Hx     ROS: Review of Systems  Constitutional: Positive for weight loss.  Gastrointestinal: Negative for nausea and vomiting.  Musculoskeletal:       Negative muscle weakness  Endo/Heme/Allergies:       Negative polyphagia    PHYSICAL EXAM: Blood pressure 107/71, pulse 80, temperature 98.3 F (36.8 C), temperature source Oral, height 5' 3"  (1.6 m), weight 191 lb (86.6 kg), SpO2 95 %. Body mass index is 33.83 kg/m. Physical Exam  Constitutional: She is oriented to person, place, and time. She appears well-developed and well-nourished.  Cardiovascular: Normal rate.  Pulmonary/Chest: Effort normal.  Musculoskeletal: Normal range of motion.  Neurological: She is oriented to person, place, and time.  Skin: Skin is warm and dry.  Psychiatric: She has a normal mood and affect. Her behavior is normal.    Vitals reviewed.   RECENT LABS AND TESTS: BMET    Component Value Date/Time   NA 134 (L) 11/07/2017 1426   NA 136 08/25/2017 1047   K 4.6 11/07/2017 0634   CL 104 11/07/2017 0634   CO2 19 (L) 11/07/2017 0634   GLUCOSE 97 11/07/2017 0634   BUN 21 (H) 11/07/2017 0634   BUN 15 08/25/2017 1047  CREATININE 0.89 11/07/2017 0634   CALCIUM 9.0 11/07/2017 0634   GFRNONAA >60 11/07/2017 0634   GFRAA >60 11/07/2017 0634   Lab Results  Component Value Date   HGBA1C 5.4 08/25/2017   HGBA1C 5.4 12/16/2011   HGBA1C 5.7 05/03/2011   HGBA1C 5.5 02/15/2010   HGBA1C 5.5 01/13/2009   Lab Results  Component Value Date   INSULIN 50.3 (H) 08/25/2017   CBC    Component Value Date/Time   WBC 6.3 11/07/2017 0634   RBC 3.30 (L) 11/07/2017 0634   HGB 10.3 (L) 11/07/2017 0634   HGB 12.7 08/25/2017 1047   HCT 30.6 (L) 11/07/2017 0634   HCT 37.0 08/25/2017 1047   PLT 196 11/07/2017 0634   MCV 92.7 11/07/2017 0634   MCV 93 08/25/2017 1047   MCH 31.2 11/07/2017 0634   MCHC 33.7 11/07/2017 0634   RDW 12.8 11/07/2017 0634   RDW 13.2 08/25/2017 1047   LYMPHSABS 1.4 11/06/2017 1726   LYMPHSABS 1.2 08/25/2017 1047   MONOABS 0.6 11/06/2017 1726   EOSABS 0.1 11/06/2017 1726   EOSABS 0.1 08/25/2017 1047   BASOSABS 0.0 11/06/2017 1726   BASOSABS 0.0 08/25/2017 1047   Iron/TIBC/Ferritin/ %Sat No results found for: IRON, TIBC, FERRITIN, IRONPCTSAT Lipid Panel     Component Value Date/Time   CHOL 176 08/25/2017 1047   TRIG 91 08/25/2017 1047   HDL 84 08/25/2017 1047   CHOLHDL 3 08/24/2010 1134   VLDL 17.0 08/24/2010 1134   LDLCALC 74 08/25/2017 1047   Hepatic Function Panel     Component Value Date/Time   PROT 7.0 08/25/2017 1047   ALBUMIN 4.6 08/25/2017 1047   AST 35 08/25/2017 1047   ALT 73 (H) 08/25/2017 1047   ALKPHOS 73 08/25/2017 1047   BILITOT 0.3 08/25/2017 1047   BILIDIR 0.2 08/24/2010 1134      Component Value Date/Time   TSH 1.900 08/25/2017 1047   TSH 1.12  05/03/2011 1239   TSH 1.34 08/24/2010 1134    ASSESSMENT AND PLAN: Insulin resistance - Plan: metFORMIN (GLUCOPHAGE) 500 MG tablet  Vitamin D deficiency - Plan: Vitamin D, Ergocalciferol, (DRISDOL) 50000 units CAPS capsule  Class 1 obesity with serious comorbidity and body mass index (BMI) of 33.0 to 33.9 in adult, unspecified obesity type  PLAN:  Insulin Resistance Lamaya will continue to work on weight loss, exercise, and decreasing simple carbohydrates in her diet to help decrease the risk of diabetes. We dicussed metformin including benefits and risks. She was informed that eating too many simple carbohydrates or too many calories at one sitting increases the likelihood of GI side effects. Jamie [name] agrees to continue taking metformin 500 mg q AM #30 and we will refill for 1 month. Jamie [name] agrees to follow up with our clinic in 2 weeks as directed to monitor her progress.  Vitamin D Deficiency Jamie [name] was informed that low vitamin D levels contributes to fatigue and are associated with obesity, breast, and colon cancer. Jamie [name] agrees to continue taking prescription Vit D @50 ,000 IU every week #4 and we will refill for 1 month. She will follow up for routine testing of vitamin D, at least 2-3 times per year. She was informed of the risk of over-replacement of vitamin D and agrees to not increase her dose unless she discusses this with [name] first. Jamie [name] agrees to follow up with our clinic in 2 weeks.  Obesity Jamie [name] is currently in the action stage of change. As such, her goal is to continue with weight loss  efforts She has agreed to follow the Category 2 plan + 100 calories Jamie [name] has been instructed to work up to a goal of 150 minutes of combined cardio and strengthening exercise per week for weight loss and overall health benefits. We discussed the following Behavioral Modification Strategies today: increasing lean protein intake and planning for success   Jamie [name] has agreed to follow up with our  clinic in 2 weeks. She was informed of the importance of frequent follow up visits to maximize her success with intensive lifestyle modifications for her multiple health conditions.   OBESITY BEHAVIORAL INTERVENTION VISIT  Today's visit was # 9 out of 22.  Starting weight: 222 lbs Starting date: 08/25/17 Today's weight : 191 lbs Today's date: 12/25/2017 Total lbs lost to date: 29 (Patients must lose 7 lbs in the first 6 months to continue with counseling)   ASK: We discussed the diagnosis of obesity with Jamie [name] today and Jamie [name] agreed to give [name] permission to discuss obesity behavioral modification therapy today.  ASSESS: Jamie [name] has the diagnosis of obesity and her BMI today is 33.84 Jamie [name] is in the action stage of change   ADVISE: Jamie [name] was educated on the multiple health risks of obesity as well as the benefit of weight loss to improve her health. She was advised of the need for long term treatment and the importance of lifestyle modifications.  AGREE: Multiple dietary modification options and treatment options were discussed and  Baani agreed to the above obesity treatment plan.   Wilhemena Durie, am acting as transcriptionist for Lacy Duverney, PA-C I, Lacy Duverney [facility], have reviewed this note and agree with its content

## 2018-01-07 ENCOUNTER — Ambulatory Visit (INDEPENDENT_AMBULATORY_CARE_PROVIDER_SITE_OTHER): Payer: Medicare Other | Admitting: Physician Assistant

## 2018-01-07 VITALS — BP 121/70 | HR 99 | Temp 98.4°F | Ht 63.0 in | Wt 188.0 lb

## 2018-01-07 DIAGNOSIS — E559 Vitamin D deficiency, unspecified: Secondary | ICD-10-CM | POA: Diagnosis not present

## 2018-01-07 DIAGNOSIS — E669 Obesity, unspecified: Secondary | ICD-10-CM

## 2018-01-07 DIAGNOSIS — E8881 Metabolic syndrome: Secondary | ICD-10-CM | POA: Diagnosis not present

## 2018-01-07 DIAGNOSIS — Z6833 Body mass index (BMI) 33.0-33.9, adult: Secondary | ICD-10-CM

## 2018-01-07 MED ORDER — VITAMIN D (ERGOCALCIFEROL) 1.25 MG (50000 UNIT) PO CAPS: 50000.0000 [IU] | ORAL_CAPSULE | ORAL | 0 refills | Status: DC

## 2018-01-09 NOTE — Progress Notes (Signed)
Office: [phone]  /  Fax: [phone]   HPI:   Chief Complaint: OBESITY Jamie [name] is here to discuss her progress with her obesity treatment plan. She is on the Category 2 plan + 100 calories and is following her eating plan approximately 95-98 % of the time. She states she is went to the gym 2 times this week and is walking for 15 minutes 3-4 times per week. Jamie [name] is retaining fluids. She is back at the fitness club doing more physical activity. She is mindful of her eating and makes better food choices.  Her weight is 188 lb (85.3 kg) today and has gained 2 pounds since her last visit. She has lost 34 lbs since starting treatment with [name].  Vitamin D Deficiency Jamie [name] has a diagnosis of vitamin D deficiency. She is currently taking prescription Vit D and denies nausea, vomiting or muscle weakness.  Insulin Resistance Jamie [name] has a diagnosis of insulin resistance based on her elevated fasting insulin level >5. Although Jamie [name]'s blood glucose readings are still under good control, insulin resistance puts her at greater risk of metabolic syndrome and diabetes. She is taking metformin currently and continues to work on diet and exercise to decrease risk of diabetes. She denies polyphagia.  ALLERGIES: Allergies  Allergen Reactions  . Buprenorphine Hcl Rash and Shortness Of Breath  . Morphine And Related Shortness Of Breath and Rash  . Morphine And Related Shortness Of Breath  . Aspirin Other (See Comments)    REACTION: ulcers  Can take 81 mg  . Avelox [Moxifloxacin Hcl In Nacl] Hives  . Diflucan [Fluconazole] Hives    Whelps and rash c skin peeling  . Diflucan [Fluconazole] Hives  . Latex Rash  . Moxifloxacin Palpitations    Tachycardia     MEDICATIONS: Current Outpatient Medications on File Prior to Visit  Medication Sig Dispense Refill  . aspirin EC 81 MG tablet Take 1 tablet (81 mg total) by mouth daily. 90 tablet 3  . BIOTIN PO Take 1 capsule by mouth at bedtime.     .  clidinium-chlordiazePOXIDE (LIBRAX) 5-2.5 MG capsule Take 1 capsule by mouth 2 (two) times daily. (Patient taking differently: Take 2 capsules by mouth at bedtime. ) 180 capsule 3  . clonazePAM (KLONOPIN) 1 MG tablet Take 1 mg by mouth at bedtime.     . conjugated estrogens (PREMARIN) vaginal cream Place 1 Applicatorful vaginally every other day.     . diphenhydrAMINE (BENADRYL) 25 MG tablet Take 25 mg by mouth at bedtime.     Marland Kitchen esomeprazole (NEXIUM) 40 MG capsule TAKE 1 CAPSULE TWICE A DAY BEFORE MEALS 180 capsule 3  . fluticasone (FLONASE) 50 MCG/ACT nasal spray Place 2 sprays into the nose daily as needed for rhinitis or allergies.    Marland Kitchen linaclotide (LINZESS) 145 MCG CAPS capsule Take 1 capsule (145 mcg total) by mouth daily. (Patient taking differently: Take 145 mcg by mouth daily as needed (cramping). ) 90 capsule 3  . metaxalone (SKELAXIN) 800 MG tablet Take 400 mg by mouth at bedtime.     . metFORMIN (GLUCOPHAGE) 500 MG tablet Take 1 tablet (500 mg total) by mouth daily with breakfast. 30 tablet 0  . Potassium 99 MG TABS Take 1 tablet by mouth daily.    . Prenatal Vit-Fe Fumarate-FA (PRENATAL MULTIVITAMIN) TABS tablet Take 1 tablet by mouth daily at 12 noon.    . Probiotic Product (PROBIOTIC-10 PO) Take 1 capsule by mouth daily.     No current facility-administered medications  on file prior to visit.     PAST MEDICAL HISTORY: Past Medical History:  Diagnosis Date  . Allergic rhinitis   . Anxiety   . Arthritis    left foot  . Asthma   . Back pain   . Colon polyps    adenomatous  . Constipation   . Diverticulosis   . Dysrhythmia 1999   Dr Johnsie Cancel 2003  . Elevated LFTs   . Fatty liver disease, nonalcoholic   . GERD (gastroesophageal reflux disease)   . Headache(784.0)    migraines with vision changes but no pain  . Heart murmur   . Hypertension   . IBS (irritable bowel syndrome)   . Joint pain   . Migraines   . Muscle cramps   . Obesity   . Palpitations   . Pneumonia  2007  . Renal cell carcinoma ([place]) 2013   minimal partial nephrectomy  . Restless leg syndrome   . Sleep apnea    moderate; wears CPAP, uncertain about setting  . TIA (transient ischemic attack) 09/11/2014  . Ulcer   . Urinary incontinence   . Vitamin D deficiency     PAST SURGICAL HISTORY: Past Surgical History:  Procedure Laterality Date  . BREAST REDUCTION SURGERY  Jan 1991  . ELBOW SURGERY Left   . KIDNEY SURGERY Right October 2013    Tumor removed   . TONSILLECTOMY    . VAGINAL HYSTERECTOMY  1979   partial    SOCIAL HISTORY: Social History   Tobacco Use  . Smoking status: Never Smoker  . Smokeless tobacco: Never Used  Substance Use Topics  . Alcohol use: Yes    Alcohol/week: 1.2 - 1.8 oz    Types: 1 Shots of liquor, 1 - 2 Glasses of wine per week  . Drug use: No    FAMILY HISTORY: Family History  Problem Relation Age of Onset  . Heart disease Father 29  . Lung cancer Father        smoker  . Hypertension Mother   . Anxiety disorder Mother   . Heart disease Brother   . Hypertension Other   . Colon cancer Cousin   . Allergies Daughter   . Allergies Brother   . Melanoma Daughter   . Liver disease Neg Hx   . Asthma Neg Hx   . Esophageal cancer Neg Hx   . Rectal cancer Neg Hx   . Stomach cancer Neg Hx     ROS: Review of Systems  Constitutional: Negative for weight loss.  Gastrointestinal: Negative for nausea and vomiting.  Musculoskeletal:       Negative muscle weakness  Endo/Heme/Allergies:       Negative polyphagia    PHYSICAL EXAM: Blood pressure 121/70, pulse 99, temperature 98.4 F (36.9 C), temperature source Oral, height 5' 3"  (1.6 m), weight 188 lb (85.3 kg), SpO2 96 %. Body mass index is 33.3 kg/m. Physical Exam  Constitutional: She is oriented to person, place, and time. She appears well-developed and well-nourished.  Cardiovascular: Normal rate.  Pulmonary/Chest: Effort normal.  Musculoskeletal: Normal range of motion.    Neurological: She is oriented to person, place, and time.  Skin: Skin is warm and dry.  Psychiatric: She has a normal mood and affect. Her behavior is normal.  Vitals reviewed.   RECENT LABS AND TESTS: BMET    Component Value Date/Time   NA 134 (L) 11/07/2017 1426   NA 136 08/25/2017 1047   K 4.6 11/07/2017 0634   CL  104 11/07/2017 0634   CO2 19 (L) 11/07/2017 0634   GLUCOSE 97 11/07/2017 0634   BUN 21 (H) 11/07/2017 0634   BUN 15 08/25/2017 1047   CREATININE 0.89 11/07/2017 0634   CALCIUM 9.0 11/07/2017 0634   GFRNONAA >60 11/07/2017 0634   GFRAA >60 11/07/2017 0634   Lab Results  Component Value Date   HGBA1C 5.4 08/25/2017   HGBA1C 5.4 12/16/2011   HGBA1C 5.7 05/03/2011   HGBA1C 5.5 02/15/2010   HGBA1C 5.5 01/13/2009   Lab Results  Component Value Date   INSULIN 50.3 (H) 08/25/2017   CBC    Component Value Date/Time   WBC 6.3 11/07/2017 0634   RBC 3.30 (L) 11/07/2017 0634   HGB 10.3 (L) 11/07/2017 0634   HGB 12.7 08/25/2017 1047   HCT 30.6 (L) 11/07/2017 0634   HCT 37.0 08/25/2017 1047   PLT 196 11/07/2017 0634   MCV 92.7 11/07/2017 0634   MCV 93 08/25/2017 1047   MCH 31.2 11/07/2017 0634   MCHC 33.7 11/07/2017 0634   RDW 12.8 11/07/2017 0634   RDW 13.2 08/25/2017 1047   LYMPHSABS 1.4 11/06/2017 1726   LYMPHSABS 1.2 08/25/2017 1047   MONOABS 0.6 11/06/2017 1726   EOSABS 0.1 11/06/2017 1726   EOSABS 0.1 08/25/2017 1047   BASOSABS 0.0 11/06/2017 1726   BASOSABS 0.0 08/25/2017 1047   Iron/TIBC/Ferritin/ %Sat No results found for: IRON, TIBC, FERRITIN, IRONPCTSAT Lipid Panel     Component Value Date/Time   CHOL 176 08/25/2017 1047   TRIG 91 08/25/2017 1047   HDL 84 08/25/2017 1047   CHOLHDL 3 08/24/2010 1134   VLDL 17.0 08/24/2010 1134   LDLCALC 74 08/25/2017 1047   Hepatic Function Panel     Component Value Date/Time   PROT 7.0 08/25/2017 1047   ALBUMIN 4.6 08/25/2017 1047   AST 35 08/25/2017 1047   ALT 73 (H) 08/25/2017 1047   ALKPHOS  73 08/25/2017 1047   BILITOT 0.3 08/25/2017 1047   BILIDIR 0.2 08/24/2010 1134      Component Value Date/Time   TSH 1.900 08/25/2017 1047   TSH 1.12 05/03/2011 1239   TSH 1.34 08/24/2010 1134    ASSESSMENT AND PLAN: Vitamin D deficiency - Plan: Vitamin D, Ergocalciferol, (DRISDOL) 50000 units CAPS capsule  Insulin resistance  Class 1 obesity with serious comorbidity and body mass index (BMI) of 33.0 to 33.9 in adult, unspecified obesity type  PLAN:  Vitamin D Deficiency Jamie [name] was informed that low vitamin D levels contributes to fatigue and are associated with obesity, breast, and colon cancer. Kanda agrees to continue taking prescription Vit D @50 ,000 IU every week #4 and we will refill for 1 month. She will follow up for routine testing of vitamin D, at least 2-3 times per year. She was informed of the risk of over-replacement of vitamin D and agrees to not increase her dose unless she discusses this with [name] first. Jamie [name] agrees to follow up with our clinic in 3 weeks.  Insulin Resistance Jamie [name] will continue to work on weight loss, exercise, and decreasing simple carbohydrates in her diet to help decrease the risk of diabetes. We dicussed metformin including benefits and risks. She was informed that eating too many simple carbohydrates or too many calories at one sitting increases the likelihood of GI side effects. Jamie [name] agrees to continue taking her medications and she agrees to follow up with our clinic in 3 weeks as directed to monitor her progress.  Obesity Katheryne is currently in the  action stage of change. As such, her goal is to continue with weight loss efforts She has agreed to follow the Category 2 plan Liesa has been instructed to work up to a goal of 150 minutes of combined cardio and strengthening exercise per week for weight loss and overall health benefits. We discussed the following Behavioral Modification Strategies today: increasing lean protein intake and work on meal  planning and easy cooking plans   Marnie has agreed to follow up with our clinic in 3 weeks. She was informed of the importance of frequent follow up visits to maximize her success with intensive lifestyle modifications for her multiple health conditions.   OBESITY BEHAVIORAL INTERVENTION VISIT  Today's visit was # 10 out of 22.  Starting weight: 222 lbs Starting date: 08/25/17 Today's weight : 188 lbs  Today's date: 01/07/2018 Total lbs lost to date: 74 (Patients must lose 7 lbs in the first 6 months to continue with counseling)   ASK: We discussed the diagnosis of obesity with Quita Skye today and Kawanda agreed to give [name] permission to discuss obesity behavioral modification therapy today.  ASSESS: Katarina has the diagnosis of obesity and her BMI today is 33.31 Domitila is in the action stage of change   ADVISE: Twylla was educated on the multiple health risks of obesity as well as the benefit of weight loss to improve her health. She was advised of the need for long term treatment and the importance of lifestyle modifications.  AGREE: Multiple dietary modification options and treatment options were discussed and  Brileigh agreed to the above obesity treatment plan.   Wilhemena Durie, am acting as transcriptionist for Lacy Duverney, PA-C I, Lacy Duverney [facility], have reviewed this note and agree with its content

## 2018-01-18 ENCOUNTER — Encounter: Payer: Self-pay | Admitting: Nurse Practitioner

## 2018-01-19 ENCOUNTER — Encounter: Payer: Self-pay | Admitting: Nurse Practitioner

## 2018-01-19 ENCOUNTER — Telehealth: Payer: Self-pay | Admitting: *Deleted

## 2018-01-19 ENCOUNTER — Ambulatory Visit (INDEPENDENT_AMBULATORY_CARE_PROVIDER_SITE_OTHER): Payer: Medicare Other | Admitting: Nurse Practitioner

## 2018-01-19 VITALS — BP 123/76 | HR 93 | Ht 63.0 in | Wt 186.8 lb

## 2018-01-19 DIAGNOSIS — G43009 Migraine without aura, not intractable, without status migrainosus: Secondary | ICD-10-CM

## 2018-01-19 DIAGNOSIS — Z9989 Dependence on other enabling machines and devices: Secondary | ICD-10-CM

## 2018-01-19 DIAGNOSIS — G4733 Obstructive sleep apnea (adult) (pediatric): Secondary | ICD-10-CM

## 2018-01-19 NOTE — Patient Instructions (Signed)
CPAP compliance 100% Continue same settings Follow up yearly may cancel Oct appt

## 2018-01-19 NOTE — Progress Notes (Signed)
[facility]  PATIENT: Jamie [name] DOB: 1946-04-21   REASON FOR VISIT:  OSA with CPAP  HISTORY FROM:patient    HISTORY OF PRESENT ILLNESS:HISTORY (CD)Is and is here today to be evaluated for a headache, and she reports that she first had migrainous headaches when she resumed birth control pills over 40 years ago. The headaches were very debilitating at the time but there were infrequent through the 1970s. Once she discontinued hormonal contraception her headaches became as infrequent as once a year. In the early 1980s the couple lived in [place] and Mrs. Shackleton recalls that the bright light was a trigger for headaches -there was a strong photophobic component, and she made many trips to the local hospital for Demerol shots. She underwent a self hypnosis treatment and this worked well, controlled the migraine for years. On 9-20 02-2013 the patient woke up with a severe explosive kind of pain that seemed to arise from the right eye. The pain was centered retro-orbital and radiated backwards into the center of the head. The patient tried aspirin and Motrin with no benefit at the time as my colleague Dr. Janann Colonel D.O. did document.  This was different from her typical migraines. She was extremely nauseated but she could not vomit and developed dry heaves. The headache broker out of sound sleep at about about 3 AM. She had no tearing of the eye ( that she could remember), the nose was not runny and her face was not flushe.She was asleep with her CPAP.  The patient had an eye exam in 2013 which was reportedly normal at that time and her headaches were evaluated with a brain MRI which was also reviewed and found unremarkable. Not had a number of the severe headache spell since. On 09-12-14 the patient again developed a headache this time of a new quality. She was watching TV at about 11 PM she noticed that the right vision was fluctuating she states that her vision was disturbed by  changing in light and dark sensation, that she describedas if it checker board was placed in front of her eyes. By 11.45 , she tried to answer an e mail, couldn't decipher the message, non-sensical to her. She switched and tried to read aloud, she couldn't , her speech output was not correlated to the text she read. Jibberish, while her normal speech was unaffected. On the left side this time by migraines have always attack the right side of her head. This was unusual as well. The entire episode lasted less than one hour. The emergency room obtained a CT head , non contrast, read as normal and reviewed here in imaging file. Her BP was very high.  She sleeps with a CPAP, and likes it very much, she had nocturia before being diagnosed with OSA.  Her pneumonia related hospitalization in 2007 was the cause for the test by [facility], as her RN had witnessed her sleep apnea. Dr .Elsworth Soho follows her, Dr Constance Holster is her ENT.  Interval history from 03/27/2017CD. Mrs. Laverdure is doing very well she has had one of the severe headaches she used to describe in the past, -onset at about 5:30 after working on the computer. She developed a severe right-sided headache. Afraid this could be another TIA I  have recommended her to go to the emergency room, she was given morphine for the headaches which then caused another reaction and side effects. She did not have any speech arrest with this one. The emergency room did not  find anything abnormal, she underwent a CT scan of the brain, which was normal, MRI brain  was normal. I'm also able today to just review a download of her CPAP machine which has been followed by Dr. Shelia Media but not in the last 3 years. She has 100% compliance for days 100% compliance for over 4 hours of daily use, average user time is 9 hours 7 minutes set pressure is 12 cm water no EPR is used residual AHI is 0.8. Excellent result. Her obesity was the same. No weight loss.  She was given a prescrition of Fioricet and  ondansetron also known as Zofran. She's not supposed to take the medication before the headache component has started. UPDATE 03/14/2018CM Ms. Michie, 72 year old female returns for follow-up.She has not had further stroke or TIA episodes. She denies any recent falls  She had a couple episodes of blurred vision without headache. She has not had any speech arrest, weakness on one side or the other etc. She continues to use her CPAP every night, however she took that she about 2 weeks ago and we are unable to get a download. She remains on enteric-coated aspirin. She continues to get little exercise due to her chronic vertigo. Her transcranial Dopplers showed minimal PFO not likely to be clinically significant. She has been evaluated at [facility] for her chronic vertigo. Her husband reports several instances since last seen when she fell asleep at the computer without her CPAP and stopped breathing. Once he got oxygen to her she woke up and was fine.  She returns for reevaluation. UPDATE 06/14/2018CM Ms. Berland, 72 year old female returns for follow-up she has a history of TIA and is currently on aspirin for secondary stroke prevention. Blood pressure in the office today 105/65. She continues to take at home and generally in the 818 systolic range. Patient was seen in the emergency room on 11/07/2016 for headache in the left unilateral region the pain did not radiate no nausea and vomiting. She had some visual blurring and has a history of similar headaches She was given Reglan and Benadryl. She is having maybe 2 headaches not enough for a preventive medication. She says prescription strength Motrin takes the headache away worst part is the nausea for which she has nothing to take. She denies any speech arrest weakness on one side or the other. She continues to be compliant with her CPAP download was obtained today. Greater than 4 hours use 97% or 29 days. Average usage not hours 13 minutes at 12 cm pressure.  AHI 0.8 She returns for reevaluation 04/22/17 CD I have the pleasure of seeing Mrs. Bertholf today on 04/22/2017, and a routine follow up for CPAP compliance. She has done excellently with her CPAP machine which is set at 12 cm water pressure without expiratory pressure relief. Her AHI is 0.7 with an average user time of 9 hours and 2 minutes each night. She had a 97% compliance, power was lost first a night due to [employer]. [employer]- will ask for a battery back up- Linzie Collin, RPSGT. UPDATE 7/15/2019CM Ms. Esch, 72 year old female returns for follow-up for CPAP compliance.  She had a motor change in her machine.  Compliance data dated 12/20/2017 01/18/2018 shows compliance greater than 4 hours at 100%.  Average usage 9 hours 22 minutes.  Set pressure 12 cm EPR level 1.  no leak or AHI data recorded on the report.  ESS 2 she returns for reevaluation REVIEW OF SYSTEMS: Full 14 system review  of systems performed and notable only for those listed, all others are neg:  Constitutional: neg  Cardiovascular: neg Ear/Nose/Throat: Ringing in the ears Skin: neg Eyes: Blurred vision light sensitivity with headaches Respiratory: neg Gastroitestinal: neg  Hematology/Lymphatic: neg  Endocrine: neg Musculoskeletal:neg Allergy/Immunology: neg Neurological: Chronic vertigo followed by [facility] Psychiatric: neg Sleep : Obstructive sleep apnea with CPAP ALLERGIES: Allergies  Allergen Reactions  . Buprenorphine Hcl Rash and Shortness Of Breath  . Morphine And Related Shortness Of Breath and Rash  . Morphine And Related Shortness Of Breath  . Aspirin Other (See Comments)    REACTION: ulcers  Can take 81 mg  . Avelox [Moxifloxacin Hcl In Nacl] Hives  . Diflucan [Fluconazole] Hives    Whelps and rash c skin peeling  . Diflucan [Fluconazole] Hives  . Latex Rash  . Moxifloxacin Palpitations    Tachycardia     HOME MEDICATIONS: Outpatient Medications Prior to Visit  Medication Sig  Dispense Refill  . aspirin EC 81 MG tablet Take 1 tablet (81 mg total) by mouth daily. 90 tablet 3  . BIOTIN PO Take 1 capsule by mouth at bedtime.     . clidinium-chlordiazePOXIDE (LIBRAX) 5-2.5 MG capsule Take 1 capsule by mouth 2 (two) times daily. (Patient taking differently: Take 2 capsules by mouth at bedtime. ) 180 capsule 3  . conjugated estrogens (PREMARIN) vaginal cream Place 1 Applicatorful vaginally every other day.     . diphenhydrAMINE (BENADRYL) 25 MG tablet Take 25 mg by mouth at bedtime.     Marland Kitchen esomeprazole (NEXIUM) 40 MG capsule TAKE 1 CAPSULE TWICE A DAY BEFORE MEALS 180 capsule 3  . fluticasone (FLONASE) 50 MCG/ACT nasal spray Place 2 sprays into the nose daily as needed for rhinitis or allergies.    Marland Kitchen linaclotide (LINZESS) 145 MCG CAPS capsule Take 1 capsule (145 mcg total) by mouth daily. (Patient taking differently: Take 145 mcg by mouth daily as needed (cramping). ) 90 capsule 3  . metFORMIN (GLUCOPHAGE) 500 MG tablet Take 1 tablet (500 mg total) by mouth daily with breakfast. 30 tablet 0  . Potassium 99 MG TABS Take 1 tablet by mouth daily.    . Prenatal Vit-Fe Fumarate-FA (PRENATAL MULTIVITAMIN) TABS tablet Take 1 tablet by mouth daily at 12 noon.    . Probiotic Product (PROBIOTIC-10 PO) Take 1 capsule by mouth daily.    Marland Kitchen spironolactone (ALDACTONE) 50 MG tablet 50 mg daily.    . Vitamin D, Ergocalciferol, (DRISDOL) 50000 units CAPS capsule Take 1 capsule (50,000 Units total) by mouth every 7 (seven) days. 4 capsule 0  . clonazePAM (KLONOPIN) 1 MG tablet Take 1 mg by mouth at bedtime.     . metaxalone (SKELAXIN) 800 MG tablet Take 400 mg by mouth at bedtime.      No facility-administered medications prior to visit.     PAST MEDICAL HISTORY: Past Medical History:  Diagnosis Date  . Allergic rhinitis   . Anxiety   . Arthritis    left foot  . Asthma   . Back pain   . Colon polyps    adenomatous  . Constipation   . Diverticulosis   . Dysrhythmia 1999   Dr  Johnsie Cancel 2003  . Elevated LFTs   . Fatty liver disease, nonalcoholic   . GERD (gastroesophageal reflux disease)   . Headache(784.0)    migraines with vision changes but no pain  . Heart murmur   . Hypertension   . IBS (irritable bowel syndrome)   .  Joint pain   . Migraines   . Muscle cramps   . Obesity   . Palpitations   . Pneumonia 2007  . Renal cell carcinoma ([place]) 2013   minimal partial nephrectomy  . Restless leg syndrome   . Sleep apnea    moderate; wears CPAP, uncertain about setting  . TIA (transient ischemic attack) 09/11/2014  . Ulcer   . Urinary incontinence   . Vitamin D deficiency     PAST SURGICAL HISTORY: Past Surgical History:  Procedure Laterality Date  . BREAST REDUCTION SURGERY  Jan 1991  . ELBOW SURGERY Left   . KIDNEY SURGERY Right October 2013    Tumor removed   . TONSILLECTOMY    . VAGINAL HYSTERECTOMY  1979   partial    FAMILY HISTORY: Family History  Problem Relation Age of Onset  . Heart disease Father 59  . Lung cancer Father        smoker  . Hypertension Mother   . Anxiety disorder Mother   . Heart disease Brother   . Hypertension Other   . Colon cancer Cousin   . Allergies Daughter   . Allergies Brother   . Melanoma Daughter   . Lung cancer Sister   . Liver disease Neg Hx   . Asthma Neg Hx   . Esophageal cancer Neg Hx   . Rectal cancer Neg Hx   . Stomach cancer Neg Hx     SOCIAL HISTORY: Social History   Socioeconomic History  . Marital status: Married    Spouse name: Sallyanne Kuster  . Number of children: 2  . Years of education: 33  . Highest education level: Not on file  Occupational History  . Occupation: Retired    Fish farm manager: OTHER    Comment: real Conservation officer, historic buildings  Social Needs  . Financial resource strain: Not on file  . Food insecurity:    Worry: Not on file    Inability: Not on file  . Transportation needs:    Medical: Not on file    Non-medical: Not on file  Tobacco Use  . Smoking status: Never Smoker  .  Smokeless tobacco: Never Used  Substance and Sexual Activity  . Alcohol use: Yes    Alcohol/week: 1.2 - 1.8 oz    Types: 1 - 2 Glasses of wine, 1 Shots of liquor per week  . Drug use: No  . Sexual activity: Yes    Birth control/protection: Surgical  Lifestyle  . Physical activity:    Days per week: Not on file    Minutes per session: Not on file  . Stress: Not on file  Relationships  . Social connections:    Talks on phone: Not on file    Gets together: Not on file    Attends religious service: Not on file    Active member of club or organization: Not on file    Attends meetings of clubs or organizations: Not on file    Relationship status: Not on file  . Intimate partner violence:    Fear of current or ex partner: Not on file    Emotionally abused: Not on file    Physically abused: Not on file    Forced sexual activity: Not on file  Other Topics Concern  . Not on file  Social History Narrative   Merged History Encounter        Exercise 3-4 times a week.   Married Sallyanne Kuster) and lives with her spouse.   Retired  Real estate.   Education- college   Patient is right handed.   Patient drinks tea a lot and water.     PHYSICAL EXAM  Vitals:   01/19/18 1324  BP: 123/76  Pulse: 93  Weight: 186 lb 12.8 oz (84.7 kg)  Height: 5' 3"  (1.6 m)   Body mass index is 33.09 kg/m.  Generalized: Well developed,Obese female in no acute distress  Head: normocephalic and atraumatic,. Oropharynx benign mallopatti 2 Neck: Supple, circumference 17.5 Lungs clear Skin no edema   Neurological examination   Mentation: Alert oriented to time, place, history taking. Attention span and concentration appropriate. Recent and remote memory intact.  Follows all commands speech and language fluent. ESS 2  Cranial nerve II-XII: .Pupils were equal round reactive to light extraocular movements were full, visual field were full on confrontational test. Facial sensation and strength were  normal. hearing was intact to finger rubbing bilaterally. Uvula tongue midline. head turning and shoulder shrug were normal and symmetric.Tongue protrusion into cheek strength was normal. Motor: normal bulk and tone, full strength in the BUE, BLE, fine finger movements normal, no pronator drift.  Sensory: normal and symmetric to light touch, pinprick, and  Vibration, in the upper and lower extremities Coordination: finger-nose-finger, heel-to-shin bilaterally, no dysmetria Reflexes: Symmetric upper and lower plantar responses were flexor bilaterally. Gait and Station: Rising up from seated position without assistance, normal stance,  moderate stride, good arm swing, smooth turning, able to perform tiptoe, and heel walking without difficulty. Tandem gait is steady.  DIAGNOSTIC DATA (LABS, IMAGING, TESTING) - I reviewed patient records, labs, notes, testing and imaging myself where available.  Lab Results  Component Value Date   WBC 6.3 11/07/2017   HGB 10.3 (L) 11/07/2017   HCT 30.6 (L) 11/07/2017   MCV 92.7 11/07/2017   PLT 196 11/07/2017      Component Value Date/Time   NA 134 (L) 11/07/2017 1426   NA 136 08/25/2017 1047   K 4.6 11/07/2017 0634   CL 104 11/07/2017 0634   CO2 19 (L) 11/07/2017 0634   GLUCOSE 97 11/07/2017 0634   BUN 21 (H) 11/07/2017 0634   BUN 15 08/25/2017 1047   CREATININE 0.89 11/07/2017 0634   CALCIUM 9.0 11/07/2017 0634   PROT 7.0 08/25/2017 1047   ALBUMIN 4.6 08/25/2017 1047   AST 35 08/25/2017 1047   ALT 73 (H) 08/25/2017 1047   ALKPHOS 73 08/25/2017 1047   BILITOT 0.3 08/25/2017 1047   GFRNONAA >60 11/07/2017 0634   GFRAA >60 11/07/2017 7017      ASSESSMENT AND PLAN  72 y.o. year old female  has a past medical history of Obstructive sleep apnea with CPAP, chronic vertigo she has participated in vestibular rehabilitation without success. She is seeing a specialist at [facility] but this was no help. Headaches are in good control .Compliance data  dated 12/20/2017 01/18/2018 shows compliance greater than 4 hours at 100%.  Average usage 9 hours 22 minutes.  Set pressure 12 cm EPR level 1.  no leak or AHI data recorded on the report.  ESS 2     PLAN: CPAP compliance 100% Continue same settings Called the equipment company to get a complete print out  follow up yearly may cancel Oct appt Dennie Bible, [facility], [facility], [facility] [address], [place], [place] [address] [phone]

## 2018-01-19 NOTE — Telephone Encounter (Signed)
LVM reminding patient of her follow up today, advised she arrive at 1:15 to check in. Requested she bring CPAP chip or the CPAP machine to her follow up.  Advised her the NP will need to download her CPAP information. Left office number.

## 2018-01-29 ENCOUNTER — Ambulatory Visit (INDEPENDENT_AMBULATORY_CARE_PROVIDER_SITE_OTHER): Payer: Medicare Other | Admitting: Family Medicine

## 2018-01-29 VITALS — BP 110/73 | HR 85 | Temp 98.4°F | Ht 63.0 in | Wt 178.0 lb

## 2018-01-29 DIAGNOSIS — E669 Obesity, unspecified: Secondary | ICD-10-CM

## 2018-01-29 DIAGNOSIS — E559 Vitamin D deficiency, unspecified: Secondary | ICD-10-CM | POA: Diagnosis not present

## 2018-01-29 DIAGNOSIS — E8881 Metabolic syndrome: Secondary | ICD-10-CM | POA: Diagnosis not present

## 2018-01-29 DIAGNOSIS — Z6831 Body mass index (BMI) 31.0-31.9, adult: Secondary | ICD-10-CM

## 2018-01-29 MED ORDER — VITAMIN D (ERGOCALCIFEROL) 1.25 MG (50000 UNIT) PO CAPS: 50000.0000 [IU] | ORAL_CAPSULE | ORAL | 0 refills | Status: DC

## 2018-01-29 MED ORDER — METFORMIN HCL 500 MG PO TABS: 500.0000 mg | ORAL_TABLET | Freq: Every day | ORAL | 0 refills | Status: DC

## 2018-01-29 NOTE — Progress Notes (Signed)
Office: [phone]  /  Fax: [phone]   HPI:   Chief Complaint: OBESITY Jamie [name] is here to discuss her progress with her obesity treatment plan. She is on the Category 2 plan and is following her eating plan approximately 98 % of the time. She states she is strengthening, balance and aerobics for 60 to 90 minutes 3 times per week. Jamie [name] continues to do well with weight loss on the category 2 plan. She states hunger is controlled and she is happy with her category 2 plan. Her weight is 178 lb (80.7 kg) today and has had a weight loss of 10 pounds over a period of 3 weeks since her last visit. She has lost 44 lbs since starting treatment with [name].  Insulin Resistance Jamie [name] has a diagnosis of insulin resistance based on her elevated fasting insulin level >5. Although Jamie [name]'s blood glucose readings are still under good control, insulin resistance puts her at greater risk of metabolic syndrome and diabetes. Jamie [name] is stable on metformin and she continues to work on diet and exercise to decrease risk of diabetes. Jamie [name] denies hypoglycemia, nausea or vomiting.  Vitamin D deficiency Jamie [name] has a diagnosis of vitamin D deficiency. She is  Stable on vit D and denies nausea, vomiting or muscle weakness.  ALLERGIES: Allergies  Allergen Reactions  . Buprenorphine Hcl Rash and Shortness Of Breath  . Morphine And Related Shortness Of Breath and Rash  . Morphine And Related Shortness Of Breath  . Aspirin Other (See Comments)    REACTION: ulcers  Can take 81 mg  . Avelox [Moxifloxacin Hcl In Nacl] Hives  . Diflucan [Fluconazole] Hives    Whelps and rash c skin peeling  . Diflucan [Fluconazole] Hives  . Latex Rash  . Moxifloxacin Palpitations    Tachycardia     MEDICATIONS: Current Outpatient Medications on File Prior to Visit  Medication Sig Dispense Refill  . aspirin EC 81 MG tablet Take 1 tablet (81 mg total) by mouth daily. 90 tablet 3  . BIOTIN PO Take 1 capsule by mouth at bedtime.      . clidinium-chlordiazePOXIDE (LIBRAX) 5-2.5 MG capsule Take 1 capsule by mouth 2 (two) times daily. (Patient taking differently: Take 2 capsules by mouth at bedtime. ) 180 capsule 3  . conjugated estrogens (PREMARIN) vaginal cream Place 1 Applicatorful vaginally every other day.     . diphenhydrAMINE (BENADRYL) 25 MG tablet Take 25 mg by mouth at bedtime.     Marland Kitchen esomeprazole (NEXIUM) 40 MG capsule TAKE 1 CAPSULE TWICE A DAY BEFORE MEALS 180 capsule 3  . fluticasone (FLONASE) 50 MCG/ACT nasal spray Place 2 sprays into the nose daily as needed for rhinitis or allergies.    Marland Kitchen linaclotide (LINZESS) 145 MCG CAPS capsule Take 1 capsule (145 mcg total) by mouth daily. (Patient taking differently: Take 145 mcg by mouth daily as needed (cramping). ) 90 capsule 3  . Potassium 99 MG TABS Take 1 tablet by mouth daily.    . Prenatal Vit-Fe Fumarate-FA (PRENATAL MULTIVITAMIN) TABS tablet Take 1 tablet by mouth daily at 12 noon.    . Probiotic Product (PROBIOTIC-10 PO) Take 1 capsule by mouth daily.    Marland Kitchen spironolactone (ALDACTONE) 50 MG tablet 50 mg daily.     No current facility-administered medications on file prior to visit.     PAST MEDICAL HISTORY: Past Medical History:  Diagnosis Date  . Allergic rhinitis   . Anxiety   . Arthritis    left foot  .  Asthma   . Back pain   . Colon polyps    adenomatous  . Constipation   . Diverticulosis   . Dysrhythmia 1999   Dr Johnsie Cancel 2003  . Elevated LFTs   . Fatty liver disease, nonalcoholic   . GERD (gastroesophageal reflux disease)   . Headache(784.0)    migraines with vision changes but no pain  . Heart murmur   . Hypertension   . IBS (irritable bowel syndrome)   . Joint pain   . Migraines   . Muscle cramps   . Obesity   . Palpitations   . Pneumonia 2007  . Renal cell carcinoma ([place]) 2013   minimal partial nephrectomy  . Restless leg syndrome   . Sleep apnea    moderate; wears CPAP, uncertain about setting  . TIA (transient ischemic attack)  09/11/2014  . Ulcer   . Urinary incontinence   . Vitamin D deficiency     PAST SURGICAL HISTORY: Past Surgical History:  Procedure Laterality Date  . BREAST REDUCTION SURGERY  Jan 1991  . ELBOW SURGERY Left   . KIDNEY SURGERY Right October 2013    Tumor removed   . TONSILLECTOMY    . VAGINAL HYSTERECTOMY  1979   partial    SOCIAL HISTORY: Social History   Tobacco Use  . Smoking status: Never Smoker  . Smokeless tobacco: Never Used  Substance Use Topics  . Alcohol use: Yes    Alcohol/week: 1.2 - 1.8 oz    Types: 1 - 2 Glasses of wine, 1 Shots of liquor per week  . Drug use: No    FAMILY HISTORY: Family History  Problem Relation Age of Onset  . Heart disease Father 93  . Lung cancer Father        smoker  . Hypertension Mother   . Anxiety disorder Mother   . Heart disease Brother   . Hypertension Other   . Colon cancer Cousin   . Allergies Daughter   . Allergies Brother   . Melanoma Daughter   . Lung cancer Sister   . Liver disease Neg Hx   . Asthma Neg Hx   . Esophageal cancer Neg Hx   . Rectal cancer Neg Hx   . Stomach cancer Neg Hx     ROS: Review of Systems  Constitutional: Positive for weight loss.  Gastrointestinal: Negative for nausea and vomiting.  Musculoskeletal:       Negative for muscle weakness  Endo/Heme/Allergies:       Negative for hypoglycemia    PHYSICAL EXAM: Blood pressure 110/73, pulse 85, temperature 98.4 F (36.9 C), temperature source Oral, height 5' 3"  (1.6 m), weight 178 lb (80.7 kg), SpO2 97 %. Body mass index is 31.53 kg/m. Physical Exam  Constitutional: She is oriented to person, place, and time. She appears well-developed and well-nourished.  Cardiovascular: Normal rate.  Pulmonary/Chest: Effort normal.  Musculoskeletal: Normal range of motion.  Neurological: She is oriented to person, place, and time.  Skin: Skin is warm and dry.  Psychiatric: She has a normal mood and affect. Her behavior is normal.  Vitals  reviewed.   RECENT LABS AND TESTS: BMET    Component Value Date/Time   NA 134 (L) 11/07/2017 1426   NA 136 08/25/2017 1047   K 4.6 11/07/2017 0634   CL 104 11/07/2017 0634   CO2 19 (L) 11/07/2017 0634   GLUCOSE 97 11/07/2017 0634   BUN 21 (H) 11/07/2017 0634   BUN 15 08/25/2017 1047  CREATININE 0.89 11/07/2017 0634   CALCIUM 9.0 11/07/2017 0634   GFRNONAA >60 11/07/2017 0634   GFRAA >60 11/07/2017 0634   Lab Results  Component Value Date   HGBA1C 5.4 08/25/2017   HGBA1C 5.4 12/16/2011   HGBA1C 5.7 05/03/2011   HGBA1C 5.5 02/15/2010   HGBA1C 5.5 01/13/2009   Lab Results  Component Value Date   INSULIN 50.3 (H) 08/25/2017   CBC    Component Value Date/Time   WBC 6.3 11/07/2017 0634   RBC 3.30 (L) 11/07/2017 0634   HGB 10.3 (L) 11/07/2017 0634   HGB 12.7 08/25/2017 1047   HCT 30.6 (L) 11/07/2017 0634   HCT 37.0 08/25/2017 1047   PLT 196 11/07/2017 0634   MCV 92.7 11/07/2017 0634   MCV 93 08/25/2017 1047   MCH 31.2 11/07/2017 0634   MCHC 33.7 11/07/2017 0634   RDW 12.8 11/07/2017 0634   RDW 13.2 08/25/2017 1047   LYMPHSABS 1.4 11/06/2017 1726   LYMPHSABS 1.2 08/25/2017 1047   MONOABS 0.6 11/06/2017 1726   EOSABS 0.1 11/06/2017 1726   EOSABS 0.1 08/25/2017 1047   BASOSABS 0.0 11/06/2017 1726   BASOSABS 0.0 08/25/2017 1047   Iron/TIBC/Ferritin/ %Sat No results found for: IRON, TIBC, FERRITIN, IRONPCTSAT Lipid Panel     Component Value Date/Time   CHOL 176 08/25/2017 1047   TRIG 91 08/25/2017 1047   HDL 84 08/25/2017 1047   CHOLHDL 3 08/24/2010 1134   VLDL 17.0 08/24/2010 1134   LDLCALC 74 08/25/2017 1047   Hepatic Function Panel     Component Value Date/Time   PROT 7.0 08/25/2017 1047   ALBUMIN 4.6 08/25/2017 1047   AST 35 08/25/2017 1047   ALT 73 (H) 08/25/2017 1047   ALKPHOS 73 08/25/2017 1047   BILITOT 0.3 08/25/2017 1047   BILIDIR 0.2 08/24/2010 1134      Component Value Date/Time   TSH 1.900 08/25/2017 1047   TSH 1.12 05/03/2011 1239     TSH 1.34 08/24/2010 1134   Results for Bidwell, Mykira D "[name]" (MRN [id number]) as of 01/29/2018 15:52  Ref. Range 08/25/2017 10:47  Vitamin D, 25-Hydroxy Latest Ref Range: 30.0 - 100.0 ng/mL 38.3   ASSESSMENT AND PLAN: Insulin resistance - Plan: metFORMIN (GLUCOPHAGE) 500 MG tablet  Vitamin D deficiency - Plan: Vitamin D, Ergocalciferol, (DRISDOL) 50000 units CAPS capsule  Class 1 obesity with serious comorbidity and body mass index (BMI) of 31.0 to 31.9 in adult, unspecified obesity type  PLAN:  Insulin Resistance Jamie [name] will continue to work on weight loss, exercise, and decreasing simple carbohydrates in her diet to help decrease the risk of diabetes. We dicussed metformin including benefits and risks. She was informed that eating too many simple carbohydrates or too many calories at one sitting increases the likelihood of GI side effects. Samariyah requested metformin for now and prescription was written today for 1 month refill. Jamie [name] agreed to follow up with [name] as directed to monitor her progress. We will check labs next month.  Vitamin D Deficiency Jamie [name] was informed that low vitamin D levels contributes to fatigue and are associated with obesity, breast, and colon cancer. She agrees to continue to take prescription Vit D @50 ,000 IU every week #4 with no refills and will follow up for routine testing of vitamin D, at least 2-3 times per year. She was informed of the risk of over-replacement of vitamin D and agrees to not increase her dose unless she discusses this with [name] first. We will check labs next month  and Jamie [name] agrees to follow up at the agreed upon time.  Obesity Jamie [name] is currently in the action stage of change. As such, her goal is to continue with weight loss efforts She has agreed to follow the Category 2 plan Jamie [name] has been instructed to work up to a goal of 150 minutes of combined cardio and strengthening exercise per week or continue strengthening, balance and aerobics  60 to 90 minutes 3 times per week for weight loss and overall health benefits. We discussed the following Behavioral Modification Strategies today: increasing lean protein intake, decreasing simple carbohydrates  and work on meal planning and easy cooking plans  Jamie [name] has agreed to follow up with our clinic in 2 to 3 weeks. She was informed of the importance of frequent follow up visits to maximize her success with intensive lifestyle modifications for her multiple health conditions.   OBESITY BEHAVIORAL INTERVENTION VISIT  Today's visit was # 11 out of 22.  Starting weight: 222 lbs Starting date: 08/25/17 Today's weight : 178 lbs  Today's date: 01/29/2018 Total lbs lost to date: 33    ASK: We discussed the diagnosis of obesity with Jamie [name] today and Jamie [name] agreed to give [name] permission to discuss obesity behavioral modification therapy today.  ASSESS: Jamie [name] has the diagnosis of obesity and her BMI today is 31.54 Jamie [name] is in the action stage of change   ADVISE: Jamie [name] was educated on the multiple health risks of obesity as well as the benefit of weight loss to improve her health. She was advised of the need for long term treatment and the importance of lifestyle modifications.  AGREE: Multiple dietary modification options and treatment options were discussed and  Jamie [name] agreed to the above obesity treatment plan.  I, Jamie [name], am acting as transcriptionist for Dennard Nip, MD  I have reviewed the above documentation for accuracy and completeness, and I agree with the above. -Dennard Nip, MD

## 2018-02-06 NOTE — Progress Notes (Signed)
I agree with the assessment and plan as directed by NP .The patient is known to me .   Bolden Hagerman, MD  

## 2018-02-16 ENCOUNTER — Ambulatory Visit (INDEPENDENT_AMBULATORY_CARE_PROVIDER_SITE_OTHER): Payer: Medicare Other | Admitting: Family Medicine

## 2018-02-16 VITALS — BP 94/65 | HR 82 | Temp 98.4°F | Ht 63.0 in | Wt 174.0 lb

## 2018-02-16 DIAGNOSIS — E669 Obesity, unspecified: Secondary | ICD-10-CM | POA: Diagnosis not present

## 2018-02-16 DIAGNOSIS — Z6830 Body mass index (BMI) 30.0-30.9, adult: Secondary | ICD-10-CM | POA: Diagnosis not present

## 2018-02-16 DIAGNOSIS — E8881 Metabolic syndrome: Secondary | ICD-10-CM | POA: Diagnosis not present

## 2018-02-16 MED ORDER — METFORMIN HCL 500 MG PO TABS: 500.0000 mg | ORAL_TABLET | Freq: Every day | ORAL | 0 refills | Status: DC

## 2018-02-16 NOTE — Progress Notes (Signed)
Office: [phone]  /  Fax: [phone]   HPI:   Chief Complaint: OBESITY Jamie [name] is here to discuss her progress with her obesity treatment plan. She is on the Category 2 plan and is following her eating plan approximately 98 % of the time. She states she is exercising at the gym for 1 hour and 1/2 3 times per week. Jamie [name] continues to do well with Category 2 plan. Her BMI is now 30 and she is getting closer to goal  Her weight is 174 lb (78.9 kg) today and has had a weight loss of 4 pounds over a period of 2 to 3 weeks since her last visit. She has lost 48 lbs since starting treatment with [name].  Insulin Resistance Jamie [name] has a diagnosis of insulin resistance based on her elevated fasting insulin level >5. Level has greatly improved with metformin and diet. Although Jamie [name]'s blood glucose readings are still under good control, insulin resistance puts her at greater risk of metabolic syndrome and diabetes. She continues to work on diet and exercise to decrease risk of diabetes. She denies hypoglycemia.  ALLERGIES: Allergies  Allergen Reactions  . Buprenorphine Hcl Rash and Shortness Of Breath  . Morphine And Related Shortness Of Breath and Rash  . Morphine And Related Shortness Of Breath  . Aspirin Other (See Comments)    REACTION: ulcers  Can take 81 mg  . Avelox [Moxifloxacin Hcl In Nacl] Hives  . Diflucan [Fluconazole] Hives    Whelps and rash c skin peeling  . Diflucan [Fluconazole] Hives  . Latex Rash  . Moxifloxacin Palpitations    Tachycardia     MEDICATIONS: Current Outpatient Medications on File Prior to Visit  Medication Sig Dispense Refill  . aspirin EC 81 MG tablet Take 1 tablet (81 mg total) by mouth daily. 90 tablet 3  . BIOTIN PO Take 1 capsule by mouth at bedtime.     . clidinium-chlordiazePOXIDE (LIBRAX) 5-2.5 MG capsule Take 1 capsule by mouth 2 (two) times daily. (Patient taking differently: Take 2 capsules by mouth at bedtime. ) 180 capsule 3  . conjugated  estrogens (PREMARIN) vaginal cream Place 1 Applicatorful vaginally every other day.     . diphenhydrAMINE (BENADRYL) 25 MG tablet Take 25 mg by mouth at bedtime.     Marland Kitchen esomeprazole (NEXIUM) 40 MG capsule TAKE 1 CAPSULE TWICE A DAY BEFORE MEALS 180 capsule 3  . fluticasone (FLONASE) 50 MCG/ACT nasal spray Place 2 sprays into the nose daily as needed for rhinitis or allergies.    Marland Kitchen linaclotide (LINZESS) 145 MCG CAPS capsule Take 1 capsule (145 mcg total) by mouth daily. (Patient taking differently: Take 145 mcg by mouth daily as needed (cramping). ) 90 capsule 3  . metFORMIN (GLUCOPHAGE) 500 MG tablet Take 1 tablet (500 mg total) by mouth daily with breakfast. 30 tablet 0  . Potassium 99 MG TABS Take 1 tablet by mouth daily.    . Prenatal Vit-Fe Fumarate-FA (PRENATAL MULTIVITAMIN) TABS tablet Take 1 tablet by mouth daily at 12 noon.    . Probiotic Product (PROBIOTIC-10 PO) Take 1 capsule by mouth daily.    Marland Kitchen spironolactone (ALDACTONE) 50 MG tablet 50 mg daily.    . Vitamin D, Ergocalciferol, (DRISDOL) 50000 units CAPS capsule Take 1 capsule (50,000 Units total) by mouth every 7 (seven) days. 4 capsule 0   No current facility-administered medications on file prior to visit.     PAST MEDICAL HISTORY: Past Medical History:  Diagnosis Date  . Allergic  rhinitis   . Anxiety   . Arthritis    left foot  . Asthma   . Back pain   . Colon polyps    adenomatous  . Constipation   . Diverticulosis   . Dysrhythmia 1999   Dr Johnsie Cancel 2003  . Elevated LFTs   . Fatty liver disease, nonalcoholic   . GERD (gastroesophageal reflux disease)   . Headache(784.0)    migraines with vision changes but no pain  . Heart murmur   . Hypertension   . IBS (irritable bowel syndrome)   . Joint pain   . Migraines   . Muscle cramps   . Obesity   . Palpitations   . Pneumonia 2007  . Renal cell carcinoma ([place]) 2013   minimal partial nephrectomy  . Restless leg syndrome   . Sleep apnea    moderate; wears CPAP,  uncertain about setting  . TIA (transient ischemic attack) 09/11/2014  . Ulcer   . Urinary incontinence   . Vitamin D deficiency     PAST SURGICAL HISTORY: Past Surgical History:  Procedure Laterality Date  . BREAST REDUCTION SURGERY  Jan 1991  . ELBOW SURGERY Left   . KIDNEY SURGERY Right October 2013    Tumor removed   . TONSILLECTOMY    . VAGINAL HYSTERECTOMY  1979   partial    SOCIAL HISTORY: Social History   Tobacco Use  . Smoking status: Never Smoker  . Smokeless tobacco: Never Used  Substance Use Topics  . Alcohol use: Yes    Alcohol/week: 2.0 - 3.0 standard drinks    Types: 1 - 2 Glasses of wine, 1 Shots of liquor per week  . Drug use: No    FAMILY HISTORY: Family History  Problem Relation Age of Onset  . Heart disease Father 10  . Lung cancer Father        smoker  . Hypertension Mother   . Anxiety disorder Mother   . Heart disease Brother   . Hypertension Other   . Colon cancer Cousin   . Allergies Daughter   . Allergies Brother   . Melanoma Daughter   . Lung cancer Sister   . Liver disease Neg Hx   . Asthma Neg Hx   . Esophageal cancer Neg Hx   . Rectal cancer Neg Hx   . Stomach cancer Neg Hx     ROS: Review of Systems  Constitutional: Positive for weight loss.  Endo/Heme/Allergies:       Negative hypoglycemia    PHYSICAL EXAM: Blood pressure 94/65, pulse 82, temperature 98.4 F (36.9 C), temperature source Oral, height 5' 3"  (1.6 m), weight 174 lb (78.9 kg), SpO2 94 %. Body mass index is 30.82 kg/m. Physical Exam  Constitutional: She is oriented to person, place, and time. She appears well-developed and well-nourished.  Cardiovascular: Normal rate.  Pulmonary/Chest: Effort normal.  Musculoskeletal: Normal range of motion.  Neurological: She is oriented to person, place, and time.  Skin: Skin is warm and dry.  Psychiatric: She has a normal mood and affect. Her behavior is normal.  Vitals reviewed.   RECENT LABS AND TESTS: BMET      Component Value Date/Time   NA 134 (L) 11/07/2017 1426   NA 136 08/25/2017 1047   K 4.6 11/07/2017 0634   CL 104 11/07/2017 0634   CO2 19 (L) 11/07/2017 0634   GLUCOSE 97 11/07/2017 0634   BUN 21 (H) 11/07/2017 0634   BUN 15 08/25/2017 1047   CREATININE  0.89 11/07/2017 0634   CALCIUM 9.0 11/07/2017 0634   GFRNONAA >60 11/07/2017 0634   GFRAA >60 11/07/2017 0634   Lab Results  Component Value Date   HGBA1C 5.4 08/25/2017   HGBA1C 5.4 12/16/2011   HGBA1C 5.7 05/03/2011   HGBA1C 5.5 02/15/2010   HGBA1C 5.5 01/13/2009   Lab Results  Component Value Date   INSULIN 50.3 (H) 08/25/2017   CBC    Component Value Date/Time   WBC 6.3 11/07/2017 0634   RBC 3.30 (L) 11/07/2017 0634   HGB 10.3 (L) 11/07/2017 0634   HGB 12.7 08/25/2017 1047   HCT 30.6 (L) 11/07/2017 0634   HCT 37.0 08/25/2017 1047   PLT 196 11/07/2017 0634   MCV 92.7 11/07/2017 0634   MCV 93 08/25/2017 1047   MCH 31.2 11/07/2017 0634   MCHC 33.7 11/07/2017 0634   RDW 12.8 11/07/2017 0634   RDW 13.2 08/25/2017 1047   LYMPHSABS 1.4 11/06/2017 1726   LYMPHSABS 1.2 08/25/2017 1047   MONOABS 0.6 11/06/2017 1726   EOSABS 0.1 11/06/2017 1726   EOSABS 0.1 08/25/2017 1047   BASOSABS 0.0 11/06/2017 1726   BASOSABS 0.0 08/25/2017 1047   Iron/TIBC/Ferritin/ %Sat No results found for: IRON, TIBC, FERRITIN, IRONPCTSAT Lipid Panel     Component Value Date/Time   CHOL 176 08/25/2017 1047   TRIG 91 08/25/2017 1047   HDL 84 08/25/2017 1047   CHOLHDL 3 08/24/2010 1134   VLDL 17.0 08/24/2010 1134   LDLCALC 74 08/25/2017 1047   Hepatic Function Panel     Component Value Date/Time   PROT 7.0 08/25/2017 1047   ALBUMIN 4.6 08/25/2017 1047   AST 35 08/25/2017 1047   ALT 73 (H) 08/25/2017 1047   ALKPHOS 73 08/25/2017 1047   BILITOT 0.3 08/25/2017 1047   BILIDIR 0.2 08/24/2010 1134      Component Value Date/Time   TSH 1.900 08/25/2017 1047   TSH 1.12 05/03/2011 1239   TSH 1.34 08/24/2010 1134    ASSESSMENT AND  PLAN: Insulin resistance  Class 1 obesity with serious comorbidity and body mass index (BMI) of 30.0 to 30.9 in adult, unspecified obesity type  PLAN:  Insulin Resistance Jamie [name] will continue to work on weight loss, diet, exercise, and decreasing simple carbohydrates in her diet to help decrease the risk of diabetes. We dicussed metformin including benefits and risks. She was informed that eating too many simple carbohydrates or too many calories at one sitting increases the likelihood of GI side effects. Jamie [name] agrees to continue taking metformin 500 mg q AM #30 and we will refill for 1 month. Jamie [name] agrees to follow up with our clinic in 3 weeks as directed to monitor her progress.  Obesity Jamie [name] is currently in the action stage of change. As such, her goal is to continue with weight loss efforts She has agreed to follow the Category 2 plan Jamie [name] has been instructed to work up to a goal of 150 minutes of combined cardio and strengthening exercise per week for weight loss and overall health benefits. We discussed the following Behavioral Modification Strategies today: increasing lean protein intake, decreasing simple carbohydrates, and no skipping meals    Jamie [name] has agreed to follow up with our clinic in 3 weeks. She was informed of the importance of frequent follow up visits to maximize her success with intensive lifestyle modifications for her multiple health conditions.   OBESITY BEHAVIORAL INTERVENTION VISIT  Today's visit was # 12 out of 22.  Starting weight: 222 lbs Starting  date: 08/25/17 Today's weight : 174 lbs  Today's date: 02/16/2018 Total lbs lost to date: 53    ASK: We discussed the diagnosis of obesity with Jamie [name] today and Jamie [name] agreed to give [name] permission to discuss obesity behavioral modification therapy today.  ASSESS: Jamie [name] has the diagnosis of obesity and her BMI today is 30.83 Jamie [name] is in the action stage of change   ADVISE: Jamie [name] was educated on  the multiple health risks of obesity as well as the benefit of weight loss to improve her health. She was advised of the need for long term treatment and the importance of lifestyle modifications.  AGREE: Multiple dietary modification options and treatment options were discussed and  Jamie [name] agreed to the above obesity treatment plan.  I, Trixie Dredge, am acting as transcriptionist for Dennard Nip, MD  I have reviewed the above documentation for accuracy and completeness, and I agree with the above. -Dennard Nip, MD

## 2018-03-06 ENCOUNTER — Other Ambulatory Visit (INDEPENDENT_AMBULATORY_CARE_PROVIDER_SITE_OTHER): Payer: Self-pay | Admitting: Family Medicine

## 2018-03-06 DIAGNOSIS — E559 Vitamin D deficiency, unspecified: Secondary | ICD-10-CM

## 2018-03-10 ENCOUNTER — Encounter (INDEPENDENT_AMBULATORY_CARE_PROVIDER_SITE_OTHER): Payer: Self-pay

## 2018-03-10 ENCOUNTER — Ambulatory Visit (INDEPENDENT_AMBULATORY_CARE_PROVIDER_SITE_OTHER): Payer: Medicare Other | Admitting: Family Medicine

## 2018-03-11 ENCOUNTER — Ambulatory Visit (INDEPENDENT_AMBULATORY_CARE_PROVIDER_SITE_OTHER): Payer: Medicare Other | Admitting: Physician Assistant

## 2018-03-11 ENCOUNTER — Encounter (INDEPENDENT_AMBULATORY_CARE_PROVIDER_SITE_OTHER): Payer: Self-pay | Admitting: Physician Assistant

## 2018-03-11 VITALS — BP 127/75 | HR 72 | Temp 98.3°F | Ht 63.0 in | Wt 173.0 lb

## 2018-03-11 DIAGNOSIS — E669 Obesity, unspecified: Secondary | ICD-10-CM

## 2018-03-11 DIAGNOSIS — E559 Vitamin D deficiency, unspecified: Secondary | ICD-10-CM | POA: Diagnosis not present

## 2018-03-11 DIAGNOSIS — E8881 Metabolic syndrome: Secondary | ICD-10-CM

## 2018-03-11 DIAGNOSIS — Z6830 Body mass index (BMI) 30.0-30.9, adult: Secondary | ICD-10-CM | POA: Diagnosis not present

## 2018-03-11 MED ORDER — VITAMIN D (ERGOCALCIFEROL) 1.25 MG (50000 UNIT) PO CAPS: 50000.0000 [IU] | ORAL_CAPSULE | ORAL | 0 refills | Status: DC

## 2018-03-12 NOTE — Progress Notes (Signed)
Office: [phone]  /  Fax: [phone]   HPI:   Chief Complaint: OBESITY Jamie [name] is here to discuss her progress with her obesity treatment plan. She is on the Category 2 plan and is following her eating plan approximately 99 % of the time. She states [facility] [place] [facility] [place] 60 minutes 3 times per week. Jamie [name] did well with weight loss. She reports enjoying Category 2 plan and would like to continue.  Her weight is 173 lb (78.5 kg) today and [name] had a weight loss of 1 pound over a period of 3 to 4 weeks since her last visit. She [name] lost 49 lbs since starting treatment with [name].  Vitamin D Deficiency Jamie [name] [name] a diagnosis of vitamin D deficiency. She is on prescription Vit D and denies nausea, vomiting or muscle weakness.  Insulin Resistance Jamie [name] [name] a diagnosis of insulin resistance based on her elevated fasting insulin level >5. Although Jamie [name]'s blood glucose readings are still under good control, insulin resistance puts her at greater risk of metabolic syndrome and diabetes. She is taking metformin currently and continues to work on diet and exercise to decrease risk of diabetes. She denies polyphagia.  ALLERGIES: Allergies  Allergen Reactions  . Buprenorphine Hcl Rash and Shortness Of Breath  . Morphine And Related Shortness Of Breath and Rash  . Morphine And Related Shortness Of Breath  . Aspirin Other (See Comments)    REACTION: ulcers  Can take 81 mg  . Avelox [Moxifloxacin Hcl In Nacl] Hives  . Diflucan [Fluconazole] Hives    Whelps and rash c skin peeling  . Diflucan [Fluconazole] Hives  . Latex Rash  . Moxifloxacin Palpitations    Tachycardia     MEDICATIONS: Current Outpatient Medications on File Prior to Visit  Medication Sig Dispense Refill  . aspirin EC 81 MG tablet Take 1 tablet (81 mg total) by mouth daily. 90 tablet 3  . BIOTIN PO Take 1 capsule by mouth at bedtime.     . clidinium-chlordiazePOXIDE (LIBRAX) 5-2.5 MG capsule Take 1 capsule by mouth  2 (two) times daily. (Patient taking differently: Take 2 capsules by mouth at bedtime. ) 180 capsule 3  . conjugated estrogens (PREMARIN) vaginal cream Place 1 Applicatorful vaginally every other day.     . diphenhydrAMINE (BENADRYL) 25 MG tablet Take 25 mg by mouth at bedtime.     Marland Kitchen esomeprazole (NEXIUM) 40 MG capsule TAKE 1 CAPSULE TWICE A DAY BEFORE MEALS 180 capsule 3  . fluticasone (FLONASE) 50 MCG/ACT nasal spray Place 2 sprays into the nose daily as needed for rhinitis or allergies.    Marland Kitchen linaclotide (LINZESS) 145 MCG CAPS capsule Take 1 capsule (145 mcg total) by mouth daily. (Patient taking differently: Take 145 mcg by mouth daily as needed (cramping). ) 90 capsule 3  . metFORMIN (GLUCOPHAGE) 500 MG tablet Take 1 tablet (500 mg total) by mouth daily with breakfast. 30 tablet 0  . Potassium 99 MG TABS Take 1 tablet by mouth daily.    . Prenatal Vit-Fe Fumarate-FA (PRENATAL MULTIVITAMIN) TABS tablet Take 1 tablet by mouth daily at 12 noon.    . Probiotic Product (PROBIOTIC-10 PO) Take 1 capsule by mouth daily.    Marland Kitchen spironolactone (ALDACTONE) 50 MG tablet 50 mg daily.     No current facility-administered medications on file prior to visit.     PAST MEDICAL HISTORY: Past Medical History:  Diagnosis Date  . Allergic rhinitis   . Anxiety   . Arthritis  left foot  . Asthma   . Back pain   . Colon polyps    adenomatous  . Constipation   . Diverticulosis   . Dysrhythmia 1999   Dr Johnsie Cancel 2003  . Elevated LFTs   . Fatty liver disease, nonalcoholic   . GERD (gastroesophageal reflux disease)   . Headache(784.0)    migraines with vision changes but no pain  . Heart murmur   . Hypertension   . IBS (irritable bowel syndrome)   . Joint pain   . Migraines   . Muscle cramps   . Obesity   . Palpitations   . Pneumonia 2007  . Renal cell carcinoma ([place]) 2013   minimal partial nephrectomy  . Restless leg syndrome   . Sleep apnea    moderate; wears CPAP, uncertain about setting  .  TIA (transient ischemic attack) 09/11/2014  . Ulcer   . Urinary incontinence   . Vitamin D deficiency     PAST SURGICAL HISTORY: Past Surgical History:  Procedure Laterality Date  . BREAST REDUCTION SURGERY  Jan 1991  . ELBOW SURGERY Left   . KIDNEY SURGERY Right October 2013    Tumor removed   . TONSILLECTOMY    . VAGINAL HYSTERECTOMY  1979   partial    SOCIAL HISTORY: Social History   Tobacco Use  . Smoking status: Never Smoker  . Smokeless tobacco: Never Used  Substance Use Topics  . Alcohol use: Yes    Alcohol/week: 2.0 - 3.0 standard drinks    Types: 1 - 2 Glasses of wine, 1 Shots of liquor per week  . Drug use: No    FAMILY HISTORY: Family History  Problem Relation Age of Onset  . Heart disease Father 54  . Lung cancer Father        smoker  . Hypertension Mother   . Anxiety disorder Mother   . Heart disease Brother   . Hypertension Other   . Colon cancer Cousin   . Allergies Daughter   . Allergies Brother   . Melanoma Daughter   . Lung cancer Sister   . Liver disease Neg Hx   . Asthma Neg Hx   . Esophageal cancer Neg Hx   . Rectal cancer Neg Hx   . Stomach cancer Neg Hx     ROS: Review of Systems  Constitutional: Positive for weight loss.  Gastrointestinal: Negative for nausea and vomiting.  Musculoskeletal:       Negative muscle weakness  Endo/Heme/Allergies:       Negative polyphagia    PHYSICAL EXAM: Blood pressure 127/75, pulse 72, temperature 98.3 F (36.8 C), temperature source Oral, height 5' 3"  (1.6 m), weight 173 lb (78.5 kg), SpO2 97 %. Body mass index is 30.65 kg/m. Physical Exam  Constitutional: She is oriented to person, place, and time. She appears well-developed and well-nourished.  Cardiovascular: Normal rate.  Pulmonary/Chest: Effort normal.  Musculoskeletal: Normal range of motion.  Neurological: She is oriented to person, place, and time.  Skin: Skin is warm and dry.  Psychiatric: She [name] a normal mood and affect. Her  behavior is normal.  Vitals reviewed.   RECENT LABS AND TESTS: BMET    Component Value Date/Time   NA 134 (L) 11/07/2017 1426   NA 136 08/25/2017 1047   K 4.6 11/07/2017 0634   CL 104 11/07/2017 0634   CO2 19 (L) 11/07/2017 0634   GLUCOSE 97 11/07/2017 0634   BUN 21 (H) 11/07/2017 0634   BUN 15  08/25/2017 1047   CREATININE 0.89 11/07/2017 0634   CALCIUM 9.0 11/07/2017 0634   GFRNONAA >60 11/07/2017 0634   GFRAA >60 11/07/2017 0634   Lab Results  Component Value Date   HGBA1C 5.4 08/25/2017   HGBA1C 5.4 12/16/2011   HGBA1C 5.7 05/03/2011   HGBA1C 5.5 02/15/2010   HGBA1C 5.5 01/13/2009   Lab Results  Component Value Date   INSULIN 50.3 (H) 08/25/2017   CBC    Component Value Date/Time   WBC 6.3 11/07/2017 0634   RBC 3.30 (L) 11/07/2017 0634   HGB 10.3 (L) 11/07/2017 0634   HGB 12.7 08/25/2017 1047   HCT 30.6 (L) 11/07/2017 0634   HCT 37.0 08/25/2017 1047   PLT 196 11/07/2017 0634   MCV 92.7 11/07/2017 0634   MCV 93 08/25/2017 1047   MCH 31.2 11/07/2017 0634   MCHC 33.7 11/07/2017 0634   RDW 12.8 11/07/2017 0634   RDW 13.2 08/25/2017 1047   LYMPHSABS 1.4 11/06/2017 1726   LYMPHSABS 1.2 08/25/2017 1047   MONOABS 0.6 11/06/2017 1726   EOSABS 0.1 11/06/2017 1726   EOSABS 0.1 08/25/2017 1047   BASOSABS 0.0 11/06/2017 1726   BASOSABS 0.0 08/25/2017 1047   Iron/TIBC/Ferritin/ %Sat No results found for: IRON, TIBC, FERRITIN, IRONPCTSAT Lipid Panel     Component Value Date/Time   CHOL 176 08/25/2017 1047   TRIG 91 08/25/2017 1047   HDL 84 08/25/2017 1047   CHOLHDL 3 08/24/2010 1134   VLDL 17.0 08/24/2010 1134   LDLCALC 74 08/25/2017 1047   Hepatic Function Panel     Component Value Date/Time   PROT 7.0 08/25/2017 1047   ALBUMIN 4.6 08/25/2017 1047   AST 35 08/25/2017 1047   ALT 73 (H) 08/25/2017 1047   ALKPHOS 73 08/25/2017 1047   BILITOT 0.3 08/25/2017 1047   BILIDIR 0.2 08/24/2010 1134      Component Value Date/Time   TSH 1.900 08/25/2017 1047     TSH 1.12 05/03/2011 1239   TSH 1.34 08/24/2010 1134    ASSESSMENT AND PLAN: Vitamin D deficiency - Plan: Vitamin D, Ergocalciferol, (DRISDOL) 50000 units CAPS capsule  Insulin resistance  Class 1 obesity with serious comorbidity and body mass index (BMI) of 30.0 to 30.9 in adult, unspecified obesity type  PLAN:  Vitamin D Deficiency Biridiana was informed that low vitamin D levels contributes to fatigue and are associated with obesity, breast, and colon cancer. Jamie [name] agrees to continue taking prescription Vit D @50 ,000 IU every week #4 and we will refill for 1 month. She will follow up for routine testing of vitamin D, at least 2-3 times per year. She was informed of the risk of over-replacement of vitamin D and agrees to not increase her dose unless she discusses this with [name] first. Jamie [name] agrees to follow up with our clinic in 3 weeks.  Insulin Resistance Jamie [name] will continue to work on weight loss, diet, exercise, and decreasing simple carbohydrates in her diet to help decrease the risk of diabetes. We dicussed metformin including benefits and risks. She was informed that eating too many simple carbohydrates or too many calories at one sitting increases the likelihood of GI side effects. Jamie [name] agrees to continue taking metformin and she agrees to follow up with our clinic in 3 weeks as directed to monitor her progress.  Obesity Jamie [name] is currently in the action stage of change. As such, her goal is to continue with weight loss efforts She [name] agreed to follow the Category 2 plan Jamie [name]  been instructed to work up to a goal of 150 minutes of combined cardio and strengthening exercise per week for weight loss and overall health benefits. We discussed the following Behavioral Modification Strategies today: work on meal planning and easy cooking plans and keeping healthy foods in the home   Jamie [name] [name] agreed to follow up with our clinic in 3 weeks. She was informed of the importance of  frequent follow up visits to maximize her success with intensive lifestyle modifications for her multiple health conditions.   OBESITY BEHAVIORAL INTERVENTION VISIT  Today's visit was # 13.  Starting weight: 222 lbs Starting date: 08/25/17 Today's weight : 173 lbs  Today's date: 03/11/2018 Total lbs lost to date: 88 At least 15 minutes were spent on discussing the following behavioral intervention visit.   ASK: We discussed the diagnosis of obesity with Jamie [name] today and Jamie [name] agreed to give [name] permission to discuss obesity behavioral modification therapy today.  ASSESS: Jamie [name] [name] the diagnosis of obesity and her BMI today is 30.65 Jamie [name] is in the action stage of change   ADVISE: Jamie [name] was educated on the multiple health risks of obesity as well as the benefit of weight loss to improve her health. She was advised of the need for long term treatment and the importance of lifestyle modifications.  AGREE: Multiple dietary modification options and treatment options were discussed and  Jamie [name] agreed to the above obesity treatment plan.  Jamie [name], am acting as transcriptionist for [name], PA-C I, [name], PA-C have reviewed above note and agree with its content

## 2018-03-20 DIAGNOSIS — Z23 Encounter for immunization: Secondary | ICD-10-CM | POA: Diagnosis not present

## 2018-04-01 ENCOUNTER — Ambulatory Visit (INDEPENDENT_AMBULATORY_CARE_PROVIDER_SITE_OTHER): Payer: Medicare Other | Admitting: Physician Assistant

## 2018-04-01 VITALS — BP 110/68 | HR 89 | Temp 98.1°F | Ht 63.0 in | Wt 164.0 lb

## 2018-04-01 DIAGNOSIS — E669 Obesity, unspecified: Secondary | ICD-10-CM

## 2018-04-01 DIAGNOSIS — E8881 Metabolic syndrome: Secondary | ICD-10-CM | POA: Diagnosis not present

## 2018-04-01 DIAGNOSIS — E559 Vitamin D deficiency, unspecified: Secondary | ICD-10-CM

## 2018-04-01 DIAGNOSIS — Z6830 Body mass index (BMI) 30.0-30.9, adult: Secondary | ICD-10-CM | POA: Diagnosis not present

## 2018-04-01 DIAGNOSIS — R739 Hyperglycemia, unspecified: Secondary | ICD-10-CM | POA: Diagnosis not present

## 2018-04-01 MED ORDER — METFORMIN HCL 500 MG PO TABS: 500.0000 mg | ORAL_TABLET | Freq: Every day | ORAL | 0 refills | Status: DC

## 2018-04-01 MED ORDER — VITAMIN D (ERGOCALCIFEROL) 1.25 MG (50000 UNIT) PO CAPS: 50000.0000 [IU] | ORAL_CAPSULE | ORAL | 0 refills | Status: DC

## 2018-04-02 LAB — COMPREHENSIVE METABOLIC PANEL
ALBUMIN: 4.9 g/dL — AB (ref 3.5–4.8)
ALK PHOS: 69 IU/L (ref 39–117)
ALT: 19 IU/L (ref 0–32)
AST: 22 IU/L (ref 0–40)
Albumin/Globulin Ratio: 2.2 (ref 1.2–2.2)
BILIRUBIN TOTAL: 0.3 mg/dL (ref 0.0–1.2)
BUN / CREAT RATIO: 18 (ref 12–28)
BUN: 15 mg/dL (ref 8–27)
CHLORIDE: 99 mmol/L (ref 96–106)
CO2: 21 mmol/L (ref 20–29)
Calcium: 10.3 mg/dL (ref 8.7–10.3)
Creatinine, Ser: 0.84 mg/dL (ref 0.57–1.00)
GFR calc non Af Amer: 71 mL/min/{1.73_m2} (ref >59)
GFR, EST AFRICAN AMERICAN: 81 mL/min/{1.73_m2} (ref >59)
Globulin, Total: 2.2 g/dL (ref 1.5–4.5)
Glucose: 66 mg/dL (ref 65–99)
Potassium: 4.9 mmol/L (ref 3.5–5.2)
Sodium: 141 mmol/L (ref 134–144)
TOTAL PROTEIN: 7.1 g/dL (ref 6.0–8.5)

## 2018-04-02 LAB — VITAMIN D 25 HYDROXY (VIT D DEFICIENCY, FRACTURES): Vit D, 25-Hydroxy: 59.2 ng/mL (ref 30.0–100.0)

## 2018-04-02 LAB — INSULIN, RANDOM: INSULIN: 5.6 u[IU]/mL (ref 2.6–24.9)

## 2018-04-02 LAB — HEMOGLOBIN A1C
Est. average glucose Bld gHb Est-mCnc: 103 mg/dL
Hgb A1c MFr Bld: 5.2 % (ref 4.8–5.6)

## 2018-04-02 NOTE — Progress Notes (Signed)
Office: [phone]  /  Fax: [phone]   HPI:   Chief Complaint: OBESITY Jamie [name] is here to discuss her progress with her obesity treatment plan. She is on the Category 2 plan and is following her eating plan approximately 96 % of the time. She states she is at the [employer] working out for 60 minutes 2-3 times per week. Jamie [name] did very well with weight loss. She reports following the plan closely. She states that she is traveling to [place] next week.  Her weight is 164 lb (74.4 kg) today and has had a weight loss of 9 pounds over a period of 3 weeks since her last visit. She has lost 58 lbs since starting treatment with [name].  Insulin Resistance Jamie [name] has a diagnosis of insulin resistance based on her elevated fasting insulin level >5. Although Jamie [name]'s blood glucose readings are still under good control, insulin resistance puts her at greater risk of metabolic syndrome and diabetes. She is taking metformin currently and continues to work on diet and exercise to decrease risk of diabetes. She denies polyphagia.  Vitamin D Deficiency Jamie [name] has a diagnosis of vitamin D deficiency. She is on prescription Vit D and denies nausea, vomiting or muscle weakness.  ALLERGIES: Allergies  Allergen Reactions  . Buprenorphine Hcl Rash and Shortness Of Breath  . Morphine And Related Shortness Of Breath and Rash  . Morphine And Related Shortness Of Breath  . Aspirin Other (See Comments)    REACTION: ulcers  Can take 81 mg  . Avelox [Moxifloxacin Hcl In Nacl] Hives  . Diflucan [Fluconazole] Hives    Whelps and rash c skin peeling  . Diflucan [Fluconazole] Hives  . Latex Rash  . Moxifloxacin Palpitations    Tachycardia     MEDICATIONS: Current Outpatient Medications on File Prior to Visit  Medication Sig Dispense Refill  . aspirin EC 81 MG tablet Take 1 tablet (81 mg total) by mouth daily. 90 tablet 3  . BIOTIN PO Take 1 capsule by mouth at bedtime.     . clidinium-chlordiazePOXIDE  (LIBRAX) 5-2.5 MG capsule Take 1 capsule by mouth 2 (two) times daily. (Patient taking differently: Take 2 capsules by mouth at bedtime. ) 180 capsule 3  . conjugated estrogens (PREMARIN) vaginal cream Place 1 Applicatorful vaginally every other day.     . diphenhydrAMINE (BENADRYL) 25 MG tablet Take 25 mg by mouth at bedtime.     Jamie [name] Kitchen esomeprazole (NEXIUM) 40 MG capsule TAKE 1 CAPSULE TWICE A DAY BEFORE MEALS 180 capsule 3  . fluticasone (FLONASE) 50 MCG/ACT nasal spray Place 2 sprays into the nose daily as needed for rhinitis or allergies.    Jamie [name] Kitchen linaclotide (LINZESS) 145 MCG CAPS capsule Take 1 capsule (145 mcg total) by mouth daily. (Patient taking differently: Take 145 mcg by mouth daily as needed (cramping). ) 90 capsule 3  . Potassium 99 MG TABS Take 1 tablet by mouth daily.    . Prenatal Vit-Fe Fumarate-FA (PRENATAL MULTIVITAMIN) TABS tablet Take 1 tablet by mouth daily at 12 noon.    . Probiotic Product (PROBIOTIC-10 PO) Take 1 capsule by mouth daily.    Jamie [name] Kitchen spironolactone (ALDACTONE) 50 MG tablet 50 mg daily.     No current facility-administered medications on file prior to visit.     PAST MEDICAL HISTORY: Past Medical History:  Diagnosis Date  . Allergic rhinitis   . Anxiety   . Arthritis    left foot  . Asthma   . Back pain   .  Colon polyps    adenomatous  . Constipation   . Diverticulosis   . Dysrhythmia 1999   Dr Johnsie Cancel 2003  . Elevated LFTs   . Fatty liver disease, nonalcoholic   . GERD (gastroesophageal reflux disease)   . Headache(784.0)    migraines with vision changes but no pain  . Heart murmur   . Hypertension   . IBS (irritable bowel syndrome)   . Joint pain   . Migraines   . Muscle cramps   . Obesity   . Palpitations   . Pneumonia 2007  . Renal cell carcinoma ([place]) 2013   minimal partial nephrectomy  . Restless leg syndrome   . Sleep apnea    moderate; wears CPAP, uncertain about setting  . TIA (transient ischemic attack) 09/11/2014  . Ulcer   . Urinary  incontinence   . Vitamin D deficiency     PAST SURGICAL HISTORY: Past Surgical History:  Procedure Laterality Date  . BREAST REDUCTION SURGERY  Jan 1991  . ELBOW SURGERY Left   . KIDNEY SURGERY Right October 2013    Tumor removed   . TONSILLECTOMY    . VAGINAL HYSTERECTOMY  1979   partial    SOCIAL HISTORY: Social History   Tobacco Use  . Smoking status: Never Smoker  . Smokeless tobacco: Never Used  Substance Use Topics  . Alcohol use: Yes    Alcohol/week: 2.0 - 3.0 standard drinks    Types: 1 - 2 Glasses of wine, 1 Shots of liquor per week  . Drug use: No    FAMILY HISTORY: Family History  Problem Relation Age of Onset  . Heart disease Father 8  . Lung cancer Father        smoker  . Hypertension Mother   . Anxiety disorder Mother   . Heart disease Brother   . Hypertension Other   . Colon cancer Cousin   . Allergies Daughter   . Allergies Brother   . Melanoma Daughter   . Lung cancer Sister   . Liver disease Neg Hx   . Asthma Neg Hx   . Esophageal cancer Neg Hx   . Rectal cancer Neg Hx   . Stomach cancer Neg Hx     ROS: Review of Systems  Constitutional: Positive for weight loss.  Gastrointestinal: Negative for nausea and vomiting.  Musculoskeletal:       Negative muscle weakness  Endo/Heme/Allergies:       Negative polyphagia    PHYSICAL EXAM: Blood pressure 110/68, pulse 89, temperature 98.1 F (36.7 C), temperature source Oral, height 5' 3"  (1.6 m), weight 164 lb (74.4 kg), SpO2 94 %. Body mass index is 29.05 kg/m. Physical Exam  Constitutional: She is oriented to person, place, and time. She appears well-developed and well-nourished.  Cardiovascular: Normal rate.  Pulmonary/Chest: Effort normal.  Musculoskeletal: Normal range of motion.  Neurological: She is oriented to person, place, and time.  Skin: Skin is warm and dry.  Psychiatric: She has a normal mood and affect. Her behavior is normal.  Vitals reviewed.   RECENT LABS AND  TESTS: BMET    Component Value Date/Time   NA 141 04/01/2018 1620   K 4.9 04/01/2018 1620   CL 99 04/01/2018 1620   CO2 21 04/01/2018 1620   GLUCOSE 66 04/01/2018 1620   GLUCOSE 97 11/07/2017 0634   BUN 15 04/01/2018 1620   CREATININE 0.84 04/01/2018 1620   CALCIUM 10.3 04/01/2018 1620   GFRNONAA 71 04/01/2018 1620  GFRAA 81 04/01/2018 1620   Lab Results  Component Value Date   HGBA1C 5.2 04/01/2018   HGBA1C 5.4 08/25/2017   HGBA1C 5.4 12/16/2011   HGBA1C 5.7 05/03/2011   HGBA1C 5.5 02/15/2010   Lab Results  Component Value Date   INSULIN 5.6 04/01/2018   INSULIN 50.3 (H) 08/25/2017   CBC    Component Value Date/Time   WBC 6.3 11/07/2017 0634   RBC 3.30 (L) 11/07/2017 0634   HGB 10.3 (L) 11/07/2017 0634   HGB 12.7 08/25/2017 1047   HCT 30.6 (L) 11/07/2017 0634   HCT 37.0 08/25/2017 1047   PLT 196 11/07/2017 0634   MCV 92.7 11/07/2017 0634   MCV 93 08/25/2017 1047   MCH 31.2 11/07/2017 0634   MCHC 33.7 11/07/2017 0634   RDW 12.8 11/07/2017 0634   RDW 13.2 08/25/2017 1047   LYMPHSABS 1.4 11/06/2017 1726   LYMPHSABS 1.2 08/25/2017 1047   MONOABS 0.6 11/06/2017 1726   EOSABS 0.1 11/06/2017 1726   EOSABS 0.1 08/25/2017 1047   BASOSABS 0.0 11/06/2017 1726   BASOSABS 0.0 08/25/2017 1047   Iron/TIBC/Ferritin/ %Sat No results found for: IRON, TIBC, FERRITIN, IRONPCTSAT Lipid Panel     Component Value Date/Time   CHOL 176 08/25/2017 1047   TRIG 91 08/25/2017 1047   HDL 84 08/25/2017 1047   CHOLHDL 3 08/24/2010 1134   VLDL 17.0 08/24/2010 1134   LDLCALC 74 08/25/2017 1047   Hepatic Function Panel     Component Value Date/Time   PROT 7.1 04/01/2018 1620   ALBUMIN 4.9 (H) 04/01/2018 1620   AST 22 04/01/2018 1620   ALT 19 04/01/2018 1620   ALKPHOS 69 04/01/2018 1620   BILITOT 0.3 04/01/2018 1620   BILIDIR 0.2 08/24/2010 1134      Component Value Date/Time   TSH 1.900 08/25/2017 1047   TSH 1.12 05/03/2011 1239   TSH 1.34 08/24/2010 1134  Results for  Hutsell, Jamie D "[name]" (MRN [id number]) as of 04/02/2018 08:31  Ref. Range 04/01/2018 16:20  Vitamin D, 25-Hydroxy Latest Ref Range: 30.0 - 100.0 ng/mL 59.2    ASSESSMENT AND PLAN: Insulin resistance - Plan: metFORMIN (GLUCOPHAGE) 500 MG tablet, Comprehensive metabolic panel, Hemoglobin A1c, Insulin, random  Vitamin D deficiency - Plan: Vitamin D, Ergocalciferol, (DRISDOL) 50000 units CAPS capsule, VITAMIN D 25 Hydroxy (Vit-D Deficiency, Fractures)  Class 1 obesity with serious comorbidity and body mass index (BMI) of 30.0 to 30.9 in adult, unspecified obesity type - beginning BMI >30  PLAN:  Insulin Resistance Jamie [name] will continue to work on weight loss, diet, exercise, and decreasing simple carbohydrates in her diet to help decrease the risk of diabetes. We dicussed metformin including benefits and risks. She was informed that eating too many simple carbohydrates or too many calories at one sitting increases the likelihood of GI side effects. Jamie [name] agrees to continue taking metformin 500 mg q AM #30 and we will refill for 1 month. We will check labs and Jamie [name] agrees to follow up with our clinic in 3 weeks as directed to monitor her progress.  Vitamin D Deficiency Jamie [name] was informed that low vitamin D levels contributes to fatigue and are associated with obesity, breast, and colon cancer. Jamie [name] agrees to continue taking prescription Vit D @50 ,000 IU every week #4 and we will refill for 1 month. She will follow up for routine testing of vitamin D, at least 2-3 times per year. She was informed of the risk of over-replacement of vitamin D and agrees to not increase  her dose unless she discusses this with [name] first. We will check labs and Jamie [name] agrees to follow up with our clinic in 3 weeks.  Obesity Jamie [name] is currently in the action stage of change. As such, her goal is to continue with weight loss efforts She has agreed to follow the Category 2 plan Jamie [name] has been instructed to work up to a  goal of 150 minutes of combined cardio and strengthening exercise per week for weight loss and overall health benefits. We discussed the following Behavioral Modification Strategies today: work on meal planning and easy cooking plans and travel eating strategies   Camani has agreed to follow up with our clinic in 3 weeks. She was informed of the importance of frequent follow up visits to maximize her success with intensive lifestyle modifications for her multiple health conditions.   OBESITY BEHAVIORAL INTERVENTION VISIT  Today's visit was # 14   Starting weight: 222 lbs Starting date: 08/25/17 Today's weight : 164 lbs  Today's date: 04/01/2018 Total lbs lost to date: 57 At least 15 minutes were spent on discussing the following behavioral intervention visit.   ASK: We discussed the diagnosis of obesity with Jamie [name] today and Jamie [name] agreed to give [name] permission to discuss obesity behavioral modification therapy today.  ASSESS: Jerilynn has the diagnosis of obesity and her BMI today is 29.06 Capricia is in the action stage of change   ADVISE: Champagne was educated on the multiple health risks of obesity as well as the benefit of weight loss to improve her health. She was advised of the need for long term treatment and the importance of lifestyle modifications.  AGREE: Multiple dietary modification options and treatment options were discussed and  Ermelinda agreed to the above obesity treatment plan.  Wilhemena Durie, am acting as transcriptionist for [name], PA-C I, [name], PA-C have reviewed above note and agree with its content

## 2018-04-08 ENCOUNTER — Telehealth (INDEPENDENT_AMBULATORY_CARE_PROVIDER_SITE_OTHER): Payer: Self-pay

## 2018-04-08 NOTE — Telephone Encounter (Signed)
Spoke with the patient per PA Linus Orn and informed her to discontinue her prescription Vit D at this time and to start Vit D 2000 units daily OTC. The pt verbalized understanding.   Jamie [name], [place]

## 2018-04-22 ENCOUNTER — Ambulatory Visit: Payer: Medicare Other | Admitting: Adult Health

## 2018-04-22 ENCOUNTER — Encounter (INDEPENDENT_AMBULATORY_CARE_PROVIDER_SITE_OTHER): Payer: Self-pay | Admitting: Physician Assistant

## 2018-04-22 ENCOUNTER — Ambulatory Visit (INDEPENDENT_AMBULATORY_CARE_PROVIDER_SITE_OTHER): Payer: Medicare Other | Admitting: Physician Assistant

## 2018-04-22 VITALS — BP 112/72 | HR 78 | Temp 98.5°F | Ht 63.0 in | Wt 161.0 lb

## 2018-04-22 DIAGNOSIS — Z6830 Body mass index (BMI) 30.0-30.9, adult: Secondary | ICD-10-CM | POA: Diagnosis not present

## 2018-04-22 DIAGNOSIS — E8881 Metabolic syndrome: Secondary | ICD-10-CM

## 2018-04-22 DIAGNOSIS — E559 Vitamin D deficiency, unspecified: Secondary | ICD-10-CM

## 2018-04-22 DIAGNOSIS — E669 Obesity, unspecified: Secondary | ICD-10-CM | POA: Diagnosis not present

## 2018-04-22 DIAGNOSIS — E66811 Obesity, class 1: Secondary | ICD-10-CM

## 2018-04-22 DIAGNOSIS — E88819 Insulin resistance, unspecified: Secondary | ICD-10-CM

## 2018-04-22 MED ORDER — METFORMIN HCL 500 MG PO TABS: 500.0000 mg | ORAL_TABLET | Freq: Every day | ORAL | 0 refills | Status: DC

## 2018-04-27 NOTE — Progress Notes (Signed)
Office: [phone]  /  Fax: [phone]   HPI:   Chief Complaint: OBESITY Jamie [name] is here to discuss her progress with her obesity treatment plan. She is on the Category 2 plan and is following her eating plan approximately 80 % of the time. Jamie [name] did very well with weight loss. She reports that she was in [facility] for a week and practiced mindful eating. She is getting back to working out.  Her weight is 161 lb (73 kg) today and has had a weight loss of 3 pounds in 3 weeks since her last visit. She has lost 61 lbs since starting treatment with [name].  Insulin Resistance Jamie [name] has a diagnosis of insulin resistance based on her elevated fasting insulin level >5. Although [name]'s blood glucose readings are still under good control, insulin resistance puts her at greater risk of metabolic syndrome and diabetes. She denies nausea, vomiting, diarrhea or polyphagia. She is taking metformin currently and continues to work on diet and exercise to decrease risk of diabetes.  Vitamin D deficiency Jamie [name] has a diagnosis of vitamin D deficiency. She is new on OTC vit D as vitamin D is now in normal range.  ALLERGIES: Allergies  Allergen Reactions  . Buprenorphine Hcl Rash and Shortness Of Breath  . Morphine And Related Shortness Of Breath and Rash  . Morphine And Related Shortness Of Breath  . Aspirin Other (See Comments)    REACTION: ulcers  Can take 81 mg  . Avelox [Moxifloxacin Hcl In Nacl] Hives  . Diflucan [Fluconazole] Hives    Whelps and rash c skin peeling  . Diflucan [Fluconazole] Hives  . Latex Rash  . Moxifloxacin Palpitations    Tachycardia     MEDICATIONS: Current Outpatient Medications on File Prior to Visit  Medication Sig Dispense Refill  . aspirin EC 81 MG tablet Take 1 tablet (81 mg total) by mouth daily. 90 tablet 3  . BIOTIN PO Take 1 capsule by mouth at bedtime.     . Cholecalciferol (VITAMIN D3) 2000 units TABS Take 1 tablet by mouth daily.    .  clidinium-chlordiazePOXIDE (LIBRAX) 5-2.5 MG capsule Take 1 capsule by mouth 2 (two) times daily. (Patient taking differently: Take 2 capsules by mouth at bedtime. ) 180 capsule 3  . conjugated estrogens (PREMARIN) vaginal cream Place 1 Applicatorful vaginally every other day.     . diphenhydrAMINE (BENADRYL) 25 MG tablet Take 25 mg by mouth at bedtime.     Marland Kitchen esomeprazole (NEXIUM) 40 MG capsule TAKE 1 CAPSULE TWICE A DAY BEFORE MEALS 180 capsule 3  . fluticasone (FLONASE) 50 MCG/ACT nasal spray Place 2 sprays into the nose daily as needed for rhinitis or allergies.    Marland Kitchen linaclotide (LINZESS) 145 MCG CAPS capsule Take 1 capsule (145 mcg total) by mouth daily. (Patient taking differently: Take 145 mcg by mouth daily as needed (cramping). ) 90 capsule 3  . Potassium 99 MG TABS Take 1 tablet by mouth daily.    . Prenatal Vit-Fe Fumarate-FA (PRENATAL MULTIVITAMIN) TABS tablet Take 1 tablet by mouth daily at 12 noon.    . Probiotic Product (PROBIOTIC-10 PO) Take 1 capsule by mouth daily.    Marland Kitchen spironolactone (ALDACTONE) 50 MG tablet 50 mg daily.     No current facility-administered medications on file prior to visit.     PAST MEDICAL HISTORY: Past Medical History:  Diagnosis Date  . Allergic rhinitis   . Anxiety   . Arthritis    left foot  .  Asthma   . Back pain   . Colon polyps    adenomatous  . Constipation   . Diverticulosis   . Dysrhythmia 1999   Dr Johnsie Cancel 2003  . Elevated LFTs   . Fatty liver disease, nonalcoholic   . GERD (gastroesophageal reflux disease)   . Headache(784.0)    migraines with vision changes but no pain  . Heart murmur   . Hypertension   . IBS (irritable bowel syndrome)   . Joint pain   . Migraines   . Muscle cramps   . Obesity   . Palpitations   . Pneumonia 2007  . Renal cell carcinoma ([place]) 2013   minimal partial nephrectomy  . Restless leg syndrome   . Sleep apnea    moderate; wears CPAP, uncertain about setting  . TIA (transient ischemic attack)  09/11/2014  . Ulcer   . Urinary incontinence   . Vitamin D deficiency     PAST SURGICAL HISTORY: Past Surgical History:  Procedure Laterality Date  . BREAST REDUCTION SURGERY  Jan 1991  . ELBOW SURGERY Left   . KIDNEY SURGERY Right October 2013    Tumor removed   . TONSILLECTOMY    . VAGINAL HYSTERECTOMY  1979   partial    SOCIAL HISTORY: Social History   Tobacco Use  . Smoking status: Never Smoker  . Smokeless tobacco: Never Used  Substance Use Topics  . Alcohol use: Yes    Alcohol/week: 2.0 - 3.0 standard drinks    Types: 1 - 2 Glasses of wine, 1 Shots of liquor per week  . Drug use: No    FAMILY HISTORY: Family History  Problem Relation Age of Onset  . Heart disease Father 56  . Lung cancer Father        smoker  . Hypertension Mother   . Anxiety disorder Mother   . Heart disease Brother   . Hypertension Other   . Colon cancer Cousin   . Allergies Daughter   . Allergies Brother   . Melanoma Daughter   . Lung cancer Sister   . Liver disease Neg Hx   . Asthma Neg Hx   . Esophageal cancer Neg Hx   . Rectal cancer Neg Hx   . Stomach cancer Neg Hx     ROS: Review of Systems  Constitutional: Positive for weight loss.  Gastrointestinal: Negative for diarrhea, nausea and vomiting.  Endo/Heme/Allergies:       Negative for polyphagia.    PHYSICAL EXAM: Blood pressure 112/72, pulse 78, temperature 98.5 F (36.9 C), temperature source Oral, height 5' 3"  (1.6 m), weight 161 lb (73 kg), SpO2 97 %. Body mass index is 28.52 kg/m. Physical Exam  Constitutional: She is oriented to person, place, and time. She appears well-developed and well-nourished.  Cardiovascular: Normal rate.  Pulmonary/Chest: Effort normal.  Musculoskeletal: Normal range of motion.  Neurological: She is oriented to person, place, and time.  Skin: Skin is warm and dry.  Psychiatric: She has a normal mood and affect. Her behavior is normal.  Vitals reviewed.   RECENT LABS AND  TESTS: BMET    Component Value Date/Time   NA 141 04/01/2018 1620   K 4.9 04/01/2018 1620   CL 99 04/01/2018 1620   CO2 21 04/01/2018 1620   GLUCOSE 66 04/01/2018 1620   GLUCOSE 97 11/07/2017 0634   BUN 15 04/01/2018 1620   CREATININE 0.84 04/01/2018 1620   CALCIUM 10.3 04/01/2018 1620   GFRNONAA 71 04/01/2018 1620  GFRAA 81 04/01/2018 1620   Lab Results  Component Value Date   HGBA1C 5.2 04/01/2018   HGBA1C 5.4 08/25/2017   HGBA1C 5.4 12/16/2011   HGBA1C 5.7 05/03/2011   HGBA1C 5.5 02/15/2010   Lab Results  Component Value Date   INSULIN 5.6 04/01/2018   INSULIN 50.3 (H) 08/25/2017   CBC    Component Value Date/Time   WBC 6.3 11/07/2017 0634   RBC 3.30 (L) 11/07/2017 0634   HGB 10.3 (L) 11/07/2017 0634   HGB 12.7 08/25/2017 1047   HCT 30.6 (L) 11/07/2017 0634   HCT 37.0 08/25/2017 1047   PLT 196 11/07/2017 0634   MCV 92.7 11/07/2017 0634   MCV 93 08/25/2017 1047   MCH 31.2 11/07/2017 0634   MCHC 33.7 11/07/2017 0634   RDW 12.8 11/07/2017 0634   RDW 13.2 08/25/2017 1047   LYMPHSABS 1.4 11/06/2017 1726   LYMPHSABS 1.2 08/25/2017 1047   MONOABS 0.6 11/06/2017 1726   EOSABS 0.1 11/06/2017 1726   EOSABS 0.1 08/25/2017 1047   BASOSABS 0.0 11/06/2017 1726   BASOSABS 0.0 08/25/2017 1047   Iron/TIBC/Ferritin/ %Sat No results found for: IRON, TIBC, FERRITIN, IRONPCTSAT Lipid Panel     Component Value Date/Time   CHOL 176 08/25/2017 1047   TRIG 91 08/25/2017 1047   HDL 84 08/25/2017 1047   CHOLHDL 3 08/24/2010 1134   VLDL 17.0 08/24/2010 1134   LDLCALC 74 08/25/2017 1047   Hepatic Function Panel     Component Value Date/Time   PROT 7.1 04/01/2018 1620   ALBUMIN 4.9 (H) 04/01/2018 1620   AST 22 04/01/2018 1620   ALT 19 04/01/2018 1620   ALKPHOS 69 04/01/2018 1620   BILITOT 0.3 04/01/2018 1620   BILIDIR 0.2 08/24/2010 1134      Component Value Date/Time   TSH 1.900 08/25/2017 1047   TSH 1.12 05/03/2011 1239   TSH 1.34 08/24/2010 1134   Results  for Larmon, Kayzlee D "[name]" (MRN [id number]) as of 04/27/2018 15:19  Ref. Range 04/01/2018 16:20  Vitamin D, 25-Hydroxy Latest Ref Range: 30.0 - 100.0 ng/mL 59.2   ASSESSMENT AND PLAN: Insulin resistance - Plan: metFORMIN (GLUCOPHAGE) 500 MG tablet  Vitamin D deficiency  Class 1 obesity with serious comorbidity and body mass index (BMI) of 30.0 to 30.9 in adult, unspecified obesity type - Starting BMI greater then 30  PLAN:  Insulin Resistance Antonela will continue to work on weight loss, exercise, and decreasing simple carbohydrates in her diet to help decrease the risk of diabetes. She was informed that eating too many simple carbohydrates or too many calories at one sitting increases the likelihood of GI side effects. Suha agrees to continue metformin 567m qd #30 with no refills and a prescription was written today. Malinda agreed to follow up with [place] to monitor her progress in 3 weeks.  Vitamin D Deficiency Lindsi was informed that low vitamin D levels contributes to fatigue and are associated with obesity, breast, and colon cancer. She agrees to continue to take OTC Vit D 2,000 IU daily and will follow up for routine testing of vitamin D, at least 2-3 times per year. She was informed of the risk of over-replacement of vitamin D and agrees to not increase her dose unless she discusses this with [facility]. She agrees to follow up in 3 weeks.  Obesity Telina is currently in the action stage of change. As such, her goal is to continue with weight loss efforts. She has agreed to follow the  Category 2 plan. Georgetta has been instructed to work up to a goal of 150 minutes of combined cardio and strengthening exercise per week for weight loss and overall health benefits. We discussed the following Behavioral Modification Strategies today: work on meal planning and easy cooking plans and planning for success.  Zetta has agreed to follow up with our clinic in 3 weeks. She was informed of  the importance of frequent follow up visits to maximize her success with intensive lifestyle modifications for her multiple health conditions.   OBESITY BEHAVIORAL INTERVENTION VISIT  Today's visit was # 15  Starting weight: 222 Starting date: 08/25/17 Today's weight : Weight: 161 lb (73 kg)  Today's date: 04/22/2018 Total lbs lost to date: 61 At least 15 minutes were spent on discussing the following behavioral intervention visit.  ASK: We discussed the diagnosis of obesity with Isolde D Randol Kern today and Akeia agreed to give [name] permission to discuss obesity behavioral modification therapy today.  ASSESS: Amandamarie has the diagnosis of obesity and her BMI today is 28.53. Mikella is in the action stage of change.   ADVISE: Ashleyanne was educated on the multiple health risks of obesity as well as the benefit of weight loss to improve her health. She was advised of the need for long term treatment and the importance of lifestyle modifications to improve her current health and to decrease her risk of future health problems.  AGREE: Multiple dietary modification options and treatment options were discussed and Kathalina agreed to follow the recommendations documented in the above note.  ARRANGE: Chanetta was educated on the importance of frequent visits to treat obesity as outlined per CMS and USPSTF guidelines and agreed to schedule her next follow up appointment today.  I, Marcille Blanco, am acting as Location manager for [employer], PA-C

## 2018-05-13 ENCOUNTER — Encounter (INDEPENDENT_AMBULATORY_CARE_PROVIDER_SITE_OTHER): Payer: Self-pay | Admitting: Physician Assistant

## 2018-05-13 ENCOUNTER — Ambulatory Visit (INDEPENDENT_AMBULATORY_CARE_PROVIDER_SITE_OTHER): Payer: Medicare Other | Admitting: Physician Assistant

## 2018-05-13 VITALS — BP 99/68 | HR 83 | Temp 98.7°F | Ht 63.0 in | Wt 158.0 lb

## 2018-05-13 DIAGNOSIS — E559 Vitamin D deficiency, unspecified: Secondary | ICD-10-CM | POA: Diagnosis not present

## 2018-05-13 DIAGNOSIS — E669 Obesity, unspecified: Secondary | ICD-10-CM

## 2018-05-13 DIAGNOSIS — Z6830 Body mass index (BMI) 30.0-30.9, adult: Secondary | ICD-10-CM

## 2018-05-13 DIAGNOSIS — E66811 Obesity, class 1: Secondary | ICD-10-CM

## 2018-05-13 NOTE — Progress Notes (Signed)
Office: [phone]  /  Fax: [phone]   HPI:   Chief Complaint: OBESITY Jamie [name] is here to discuss her progress with her obesity treatment plan. She is on the Category 2 plan and is following her eating plan approximately 90 % of the time. [facility] [place] [facility]-90 [facility] 2 times per week. Jamie [name] did very well with weight loss. She reports that she has been working out more, and adding extra protein and vegetables on those days.  Her weight is 158 lb (71.7 kg) today and has had a weight loss of 3 pounds over a period of 3 weeks since her last visit. She has lost 64 lbs since starting treatment with [name].  Vitamin D Deficiency Jamie [name] has a diagnosis of vitamin D deficiency. She is on OTC Vit D and denies nausea, vomiting or muscle weakness.  ALLERGIES: Allergies  Allergen Reactions  . Buprenorphine Hcl Rash and Shortness Of Breath  . Morphine And Related Shortness Of Breath and Rash  . Morphine And Related Shortness Of Breath  . Aspirin Other (See Comments)    REACTION: ulcers  Can take 81 mg  . Avelox [Moxifloxacin Hcl In Nacl] Hives  . Diflucan [Fluconazole] Hives    Whelps and rash c skin peeling  . Diflucan [Fluconazole] Hives  . Latex Rash  . Moxifloxacin Palpitations    Tachycardia     MEDICATIONS: Current Outpatient Medications on File Prior to Visit  Medication Sig Dispense Refill  . aspirin EC 81 MG tablet Take 1 tablet (81 mg total) by mouth daily. 90 tablet 3  . BIOTIN PO Take 1 capsule by mouth at bedtime.     . Cholecalciferol (VITAMIN D3) 2000 units TABS Take 1 tablet by mouth daily.    . clidinium-chlordiazePOXIDE (LIBRAX) 5-2.5 MG capsule Take 1 capsule by mouth 2 (two) times daily. (Patient taking differently: Take 2 capsules by mouth at bedtime. ) 180 capsule 3  . conjugated estrogens (PREMARIN) vaginal cream Place 1 Applicatorful vaginally every other day.     . diphenhydrAMINE (BENADRYL) 25 MG tablet Take 25 mg by mouth at bedtime.      Marland Kitchen esomeprazole (NEXIUM) 40 MG capsule TAKE 1 CAPSULE TWICE A DAY BEFORE MEALS 180 capsule 3  . fluticasone (FLONASE) 50 MCG/ACT nasal spray Place 2 sprays into the nose daily as needed for rhinitis or allergies.    Marland Kitchen linaclotide (LINZESS) 145 MCG CAPS capsule Take 1 capsule (145 mcg total) by mouth daily. (Patient taking differently: Take 145 mcg by mouth daily as needed (cramping). ) 90 capsule 3  . metFORMIN (GLUCOPHAGE) 500 MG tablet Take 1 tablet (500 mg total) by mouth daily with breakfast. 30 tablet 0  . Potassium 99 MG TABS Take 1 tablet by mouth daily.    . Prenatal Vit-Fe Fumarate-FA (PRENATAL MULTIVITAMIN) TABS tablet Take 1 tablet by mouth daily at 12 noon.    . Probiotic Product (PROBIOTIC-10 PO) Take 1 capsule by mouth daily.    Marland Kitchen spironolactone (ALDACTONE) 50 MG tablet 50 mg daily.     No current facility-administered medications on file prior to visit.     PAST MEDICAL HISTORY: Past Medical History:  Diagnosis Date  . Allergic rhinitis   . Anxiety   . Arthritis    left foot  . Asthma   . Back pain   . Colon polyps    adenomatous  . Constipation   . Diverticulosis   . Dysrhythmia 1999   Dr Johnsie Cancel 2003  .  Elevated LFTs   . Fatty liver disease, nonalcoholic   . GERD (gastroesophageal reflux disease)   . Headache(784.0)    migraines with vision changes but no pain  . Heart murmur   . Hypertension   . IBS (irritable bowel syndrome)   . Joint pain   . Migraines   . Muscle cramps   . Obesity   . Palpitations   . Pneumonia 2007  . Renal cell carcinoma ([place]) 2013   minimal partial nephrectomy  . Restless leg syndrome   . Sleep apnea    moderate; wears CPAP, uncertain about setting  . TIA (transient ischemic attack) 09/11/2014  . Ulcer   . Urinary incontinence   . Vitamin D deficiency     PAST SURGICAL HISTORY: Past Surgical History:  Procedure Laterality Date  . BREAST REDUCTION SURGERY  Jan 1991  . ELBOW SURGERY Left   . KIDNEY SURGERY Right October  2013    Tumor removed   . TONSILLECTOMY    . VAGINAL HYSTERECTOMY  1979   partial    SOCIAL HISTORY: Social History   Tobacco Use  . Smoking status: Never Smoker  . Smokeless tobacco: Never Used  Substance Use Topics  . Alcohol use: Yes    Alcohol/week: 2.0 - 3.0 standard drinks    Types: 1 - 2 Glasses of wine, 1 Shots of liquor per week  . Drug use: No    FAMILY HISTORY: Family History  Problem Relation Age of Onset  . Heart disease Father 63  . Lung cancer Father        smoker  . Hypertension Mother   . Anxiety disorder Mother   . Heart disease Brother   . Hypertension Other   . Colon cancer Cousin   . Allergies Daughter   . Allergies Brother   . Melanoma Daughter   . Lung cancer Sister   . Liver disease Neg Hx   . Asthma Neg Hx   . Esophageal cancer Neg Hx   . Rectal cancer Neg Hx   . Stomach cancer Neg Hx     ROS: Review of Systems  Constitutional: Positive for weight loss.  Gastrointestinal: Negative for nausea and vomiting.  Musculoskeletal:       Negative muscle weakness    PHYSICAL EXAM: Blood pressure 99/68, pulse 83, temperature 98.7 F (37.1 C), temperature source Oral, height 5' 3"  (1.6 m), weight 158 lb (71.7 kg), SpO2 97 %. Body mass index is 27.99 kg/m. Physical Exam  Constitutional: She is oriented to person, place, and time. She appears well-developed and well-nourished.  Cardiovascular: Normal rate.  Pulmonary/Chest: Effort normal.  Musculoskeletal: Normal range of motion.  Neurological: She is oriented to person, place, and time.  Skin: Skin is warm and dry.  Psychiatric: She has a normal mood and affect. Her behavior is normal.  Vitals reviewed.   RECENT LABS AND TESTS: BMET    Component Value Date/Time   NA 141 04/01/2018 1620   K 4.9 04/01/2018 1620   CL 99 04/01/2018 1620   CO2 21 04/01/2018 1620   GLUCOSE 66 04/01/2018 1620   GLUCOSE 97 11/07/2017 0634   BUN 15 04/01/2018 1620   CREATININE 0.84 04/01/2018 1620    CALCIUM 10.3 04/01/2018 1620   GFRNONAA 71 04/01/2018 1620   GFRAA 81 04/01/2018 1620   Lab Results  Component Value Date   HGBA1C 5.2 04/01/2018   HGBA1C 5.4 08/25/2017   HGBA1C 5.4 12/16/2011   HGBA1C 5.7 05/03/2011   HGBA1C  5.5 02/15/2010   Lab Results  Component Value Date   INSULIN 5.6 04/01/2018   INSULIN 50.3 (H) 08/25/2017   CBC    Component Value Date/Time   WBC 6.3 11/07/2017 0634   RBC 3.30 (L) 11/07/2017 0634   HGB 10.3 (L) 11/07/2017 0634   HGB 12.7 08/25/2017 1047   HCT 30.6 (L) 11/07/2017 0634   HCT 37.0 08/25/2017 1047   PLT 196 11/07/2017 0634   MCV 92.7 11/07/2017 0634   MCV 93 08/25/2017 1047   MCH 31.2 11/07/2017 0634   MCHC 33.7 11/07/2017 0634   RDW 12.8 11/07/2017 0634   RDW 13.2 08/25/2017 1047   LYMPHSABS 1.4 11/06/2017 1726   LYMPHSABS 1.2 08/25/2017 1047   MONOABS 0.6 11/06/2017 1726   EOSABS 0.1 11/06/2017 1726   EOSABS 0.1 08/25/2017 1047   BASOSABS 0.0 11/06/2017 1726   BASOSABS 0.0 08/25/2017 1047   Iron/TIBC/Ferritin/ %Sat No results found for: IRON, TIBC, FERRITIN, IRONPCTSAT Lipid Panel     Component Value Date/Time   CHOL 176 08/25/2017 1047   TRIG 91 08/25/2017 1047   HDL 84 08/25/2017 1047   CHOLHDL 3 08/24/2010 1134   VLDL 17.0 08/24/2010 1134   LDLCALC 74 08/25/2017 1047   Hepatic Function Panel     Component Value Date/Time   PROT 7.1 04/01/2018 1620   ALBUMIN 4.9 (H) 04/01/2018 1620   AST 22 04/01/2018 1620   ALT 19 04/01/2018 1620   ALKPHOS 69 04/01/2018 1620   BILITOT 0.3 04/01/2018 1620   BILIDIR 0.2 08/24/2010 1134      Component Value Date/Time   TSH 1.900 08/25/2017 1047   TSH 1.12 05/03/2011 1239   TSH 1.34 08/24/2010 1134  Results for Fukushima, Lorin D "[name]" (MRN [id number]) as of 05/13/2018 17:51  Ref. Range 04/01/2018 16:20  Vitamin D, 25-Hydroxy Latest Ref Range: 30.0 - 100.0 ng/mL 59.2    ASSESSMENT AND PLAN: Vitamin D deficiency  Class 1 obesity with serious comorbidity and body mass  index (BMI) of 30.0 to 30.9 in adult, unspecified obesity type - Starting BMI greater then 30  PLAN:  Vitamin D Deficiency Jamie [name] was informed that low vitamin D levels contributes to fatigue and are associated with obesity, breast, and colon cancer. Jamie [name] agrees to continue taking OTC Vit D and will follow up for routine testing of vitamin D, at least 2-3 times per year. She was informed of the risk of over-replacement of vitamin D and agrees to not increase her dose unless she discusses this with [name] first. Jamie [name] agrees to follow up with our clinic in 3 weeks.  Obesity Jamie [name] is currently in the action stage of change. As such, her goal is to continue with weight loss efforts She has agreed to follow the Category 2 plan Jamie [name] has been instructed to work up to a goal of 150 minutes of combined cardio and strengthening exercise per week for weight loss and overall health benefits. We discussed the following Behavioral Modification Strategies today: work on meal planning and easy cooking plans and ways to avoid boredom eating   Jamie [name] has agreed to follow up with our clinic in 3 weeks. She was informed of the importance of frequent follow up visits to maximize her success with intensive lifestyle modifications for her multiple health conditions.   OBESITY BEHAVIORAL INTERVENTION VISIT  Today's visit was # 16  Starting weight: 222 lbs Starting date: 08/25/17 Today's weight : 158 lbs  Today's date: 05/13/2018 Total lbs lost to date: 76 At least  15 minutes were spent on discussing the following behavioral intervention visit.   ASK: We discussed the diagnosis of obesity with Jamie [name] today and Jamie [name] agreed to give [name] permission to discuss obesity behavioral modification therapy today.  ASSESS: Jamie [name] has the diagnosis of obesity and her BMI today is 19 Jamie [name] is in the action stage of change   ADVISE: Jamie [name] was educated on the multiple health risks of obesity as well as the benefit  of weight loss to improve her health. She was advised of the need for long term treatment and the importance of lifestyle modifications.  AGREE: Multiple dietary modification options and treatment options were discussed and  Jamie [name] agreed to the above obesity treatment plan.  Jamie [name], am acting as transcriptionist for [name], PA-C I, [name], PA-C have reviewed above note and agree with its content

## 2018-06-01 ENCOUNTER — Ambulatory Visit (INDEPENDENT_AMBULATORY_CARE_PROVIDER_SITE_OTHER): Payer: Medicare Other | Admitting: Physician Assistant

## 2018-06-01 ENCOUNTER — Encounter (INDEPENDENT_AMBULATORY_CARE_PROVIDER_SITE_OTHER): Payer: Self-pay

## 2018-06-01 ENCOUNTER — Encounter (INDEPENDENT_AMBULATORY_CARE_PROVIDER_SITE_OTHER): Payer: Self-pay | Admitting: Physician Assistant

## 2018-06-01 VITALS — BP 102/62 | HR 78 | Temp 98.2°F | Ht 63.0 in | Wt 161.0 lb

## 2018-06-01 DIAGNOSIS — E669 Obesity, unspecified: Secondary | ICD-10-CM

## 2018-06-01 DIAGNOSIS — E8881 Metabolic syndrome: Secondary | ICD-10-CM

## 2018-06-01 DIAGNOSIS — E559 Vitamin D deficiency, unspecified: Secondary | ICD-10-CM | POA: Diagnosis not present

## 2018-06-01 DIAGNOSIS — Z6830 Body mass index (BMI) 30.0-30.9, adult: Secondary | ICD-10-CM | POA: Diagnosis not present

## 2018-06-01 MED ORDER — METFORMIN HCL 500 MG PO TABS: 500.0000 mg | ORAL_TABLET | Freq: Every day | ORAL | 0 refills | Status: DC

## 2018-06-03 NOTE — Progress Notes (Signed)
Office: [phone]  /  Fax: [phone]   HPI:   Chief Complaint: OBESITY Jamie [name] is here to discuss her progress with her obesity treatment plan. She is on the  follow the Category 2 plan and is following her eating plan approximately 70 % of the time. She states she is exercising by going to the gym for 60-90 minutes 2-3 times per week. Jamie [name] reports getting off track with her eating recently. She has been eating more pork than normal. She is ready to get back on track.  Her weight is 161 lb (73 kg) today and has had a weight gain of 3 pounds over a period of 3 weeks since her last visit. She has lost 61 lbs since starting treatment with [name].  Insulin Resistance Jamie [name] has a diagnosis of insulin resistance based on her elevated fasting insulin level >5. Although Jamie [name]'s blood glucose readings are still under good control, insulin resistance puts her at greater risk of metabolic syndrome and diabetes. She is taking metformin currently and continues to work on diet and exercise to decrease risk of diabetes. She denies nausea, vomiting, diarrhea, or polyphagia.   Vitamin D deficiency Jamie [name] has a diagnosis of vitamin D deficiency. She is currently taking vit D OTC and denies nausea, vomiting or muscle weakness.  Ref. Range 04/01/2018 16:20  Vitamin D, 25-Hydroxy Latest Ref Range: 30.0 - 100.0 ng/mL 59.2   ALLERGIES: Allergies  Allergen Reactions  . Buprenorphine Hcl Rash and Shortness Of Breath  . Morphine And Related Shortness Of Breath and Rash  . Morphine And Related Shortness Of Breath  . Aspirin Other (See Comments)    REACTION: ulcers  Can take 81 mg  . Avelox [Moxifloxacin Hcl In Nacl] Hives  . Diflucan [Fluconazole] Hives    Whelps and rash c skin peeling  . Diflucan [Fluconazole] Hives  . Latex Rash  . Moxifloxacin Palpitations    Tachycardia     MEDICATIONS: Current Outpatient Medications on File Prior to Visit  Medication Sig Dispense Refill  . aspirin EC 81 MG  tablet Take 1 tablet (81 mg total) by mouth daily. 90 tablet 3  . BIOTIN PO Take 1 capsule by mouth at bedtime.     . Cholecalciferol (VITAMIN D3) 2000 units TABS Take 1 tablet by mouth daily.    . clidinium-chlordiazePOXIDE (LIBRAX) 5-2.5 MG capsule Take 1 capsule by mouth 2 (two) times daily. (Patient taking differently: Take 2 capsules by mouth at bedtime. ) 180 capsule 3  . conjugated estrogens (PREMARIN) vaginal cream Place 1 Applicatorful vaginally every other day.     . diphenhydrAMINE (BENADRYL) 25 MG tablet Take 25 mg by mouth at bedtime.     Marland Kitchen esomeprazole (NEXIUM) 40 MG capsule TAKE 1 CAPSULE TWICE A DAY BEFORE MEALS 180 capsule 3  . fluticasone (FLONASE) 50 MCG/ACT nasal spray Place 2 sprays into the nose daily as needed for rhinitis or allergies.    Marland Kitchen linaclotide (LINZESS) 145 MCG CAPS capsule Take 1 capsule (145 mcg total) by mouth daily. (Patient taking differently: Take 145 mcg by mouth daily as needed (cramping). ) 90 capsule 3  . Potassium 99 MG TABS Take 1 tablet by mouth daily.    . Prenatal Vit-Fe Fumarate-FA (PRENATAL MULTIVITAMIN) TABS tablet Take 1 tablet by mouth daily at 12 noon.    . Probiotic Product (PROBIOTIC-10 PO) Take 1 capsule by mouth daily.    Marland Kitchen spironolactone (ALDACTONE) 50 MG tablet 50 mg daily.     No current facility-administered medications  on file prior to visit.     PAST MEDICAL HISTORY: Past Medical History:  Diagnosis Date  . Allergic rhinitis   . Anxiety   . Arthritis    left foot  . Asthma   . Back pain   . Colon polyps    adenomatous  . Constipation   . Diverticulosis   . Dysrhythmia 1999   Dr Johnsie Cancel 2003  . Elevated LFTs   . Fatty liver disease, nonalcoholic   . GERD (gastroesophageal reflux disease)   . Headache(784.0)    migraines with vision changes but no pain  . Heart murmur   . Hypertension   . IBS (irritable bowel syndrome)   . Joint pain   . Migraines   . Muscle cramps   . Obesity   . Palpitations   . Pneumonia 2007   . Renal cell carcinoma ([place]) 2013   minimal partial nephrectomy  . Restless leg syndrome   . Sleep apnea    moderate; wears CPAP, uncertain about setting  . TIA (transient ischemic attack) 09/11/2014  . Ulcer   . Urinary incontinence   . Vitamin D deficiency     PAST SURGICAL HISTORY: Past Surgical History:  Procedure Laterality Date  . BREAST REDUCTION SURGERY  Jan 1991  . ELBOW SURGERY Left   . KIDNEY SURGERY Right October 2013    Tumor removed   . TONSILLECTOMY    . VAGINAL HYSTERECTOMY  1979   partial    SOCIAL HISTORY: Social History   Tobacco Use  . Smoking status: Never Smoker  . Smokeless tobacco: Never Used  Substance Use Topics  . Alcohol use: Yes    Alcohol/week: 2.0 - 3.0 standard drinks    Types: 1 - 2 Glasses of wine, 1 Shots of liquor per week  . Drug use: No    FAMILY HISTORY: Family History  Problem Relation Age of Onset  . Heart disease Father 18  . Lung cancer Father        smoker  . Hypertension Mother   . Anxiety disorder Mother   . Heart disease Brother   . Hypertension Other   . Colon cancer Cousin   . Allergies Daughter   . Allergies Brother   . Melanoma Daughter   . Lung cancer Sister   . Liver disease Neg Hx   . Asthma Neg Hx   . Esophageal cancer Neg Hx   . Rectal cancer Neg Hx   . Stomach cancer Neg Hx     ROS: Review of Systems  Constitutional: Negative for weight loss.  Gastrointestinal: Negative for diarrhea, nausea and vomiting.  Musculoskeletal:       Negative for muscle weakness  Endo/Heme/Allergies:       Negative for polyphagia    PHYSICAL EXAM: Blood pressure 102/62, pulse 78, temperature 98.2 F (36.8 C), temperature source Oral, height 5' 3"  (1.6 m), weight 161 lb (73 kg), SpO2 97 %. Body mass index is 28.52 kg/m. Physical Exam  Constitutional: She is oriented to person, place, and time. She appears well-developed and well-nourished.  HENT:  Head: Normocephalic.  Neck: Normal range of motion.    Cardiovascular: Normal rate.  Pulmonary/Chest: Effort normal.  Musculoskeletal: Normal range of motion.  Neurological: She is alert and oriented to person, place, and time.  Skin: Skin is warm and dry.  Psychiatric: She has a normal mood and affect. Her behavior is normal.  Vitals reviewed.   RECENT LABS AND TESTS: BMET    Component Value  Date/Time   NA 141 04/01/2018 1620   K 4.9 04/01/2018 1620   CL 99 04/01/2018 1620   CO2 21 04/01/2018 1620   GLUCOSE 66 04/01/2018 1620   GLUCOSE 97 11/07/2017 0634   BUN 15 04/01/2018 1620   CREATININE 0.84 04/01/2018 1620   CALCIUM 10.3 04/01/2018 1620   GFRNONAA 71 04/01/2018 1620   GFRAA 81 04/01/2018 1620   Lab Results  Component Value Date   HGBA1C 5.2 04/01/2018   HGBA1C 5.4 08/25/2017   HGBA1C 5.4 12/16/2011   HGBA1C 5.7 05/03/2011   HGBA1C 5.5 02/15/2010   Lab Results  Component Value Date   INSULIN 5.6 04/01/2018   INSULIN 50.3 (H) 08/25/2017   CBC    Component Value Date/Time   WBC 6.3 11/07/2017 0634   RBC 3.30 (L) 11/07/2017 0634   HGB 10.3 (L) 11/07/2017 0634   HGB 12.7 08/25/2017 1047   HCT 30.6 (L) 11/07/2017 0634   HCT 37.0 08/25/2017 1047   PLT 196 11/07/2017 0634   MCV 92.7 11/07/2017 0634   MCV 93 08/25/2017 1047   MCH 31.2 11/07/2017 0634   MCHC 33.7 11/07/2017 0634   RDW 12.8 11/07/2017 0634   RDW 13.2 08/25/2017 1047   LYMPHSABS 1.4 11/06/2017 1726   LYMPHSABS 1.2 08/25/2017 1047   MONOABS 0.6 11/06/2017 1726   EOSABS 0.1 11/06/2017 1726   EOSABS 0.1 08/25/2017 1047   BASOSABS 0.0 11/06/2017 1726   BASOSABS 0.0 08/25/2017 1047   Iron/TIBC/Ferritin/ %Sat No results found for: IRON, TIBC, FERRITIN, IRONPCTSAT Lipid Panel     Component Value Date/Time   CHOL 176 08/25/2017 1047   TRIG 91 08/25/2017 1047   HDL 84 08/25/2017 1047   CHOLHDL 3 08/24/2010 1134   VLDL 17.0 08/24/2010 1134   LDLCALC 74 08/25/2017 1047   Hepatic Function Panel     Component Value Date/Time   PROT 7.1  04/01/2018 1620   ALBUMIN 4.9 (H) 04/01/2018 1620   AST 22 04/01/2018 1620   ALT 19 04/01/2018 1620   ALKPHOS 69 04/01/2018 1620   BILITOT 0.3 04/01/2018 1620   BILIDIR 0.2 08/24/2010 1134      Component Value Date/Time   TSH 1.900 08/25/2017 1047   TSH 1.12 05/03/2011 1239   TSH 1.34 08/24/2010 1134    Ref. Range 04/01/2018 16:20  Vitamin D, 25-Hydroxy Latest Ref Range: 30.0 - 100.0 ng/mL 59.2    ASSESSMENT AND PLAN: Insulin resistance - Plan: metFORMIN (GLUCOPHAGE) 500 MG tablet  Vitamin D deficiency  Class 1 obesity with serious comorbidity and body mass index (BMI) of 30.0 to 30.9 in adult, unspecified obesity type - BMI greater than 30 at start of program  PLAN: Insulin Resistance Jamie [name] will continue to work on weight loss, exercise, and decreasing simple carbohydrates in her diet to help decrease the risk of diabetes. We dicussed metformin including benefits and risks. She was informed that eating too many simple carbohydrates or too many calories at one sitting increases the likelihood of GI side effects. Asa agrees to continue metformin 500 mg daily #30 with no refills. Jamie [name] agreed to follow up with [name] as directed to monitor her progress.  Vitamin D Deficiency Lennyn was informed that low vitamin D levels contributes to fatigue and are associated with obesity, breast, and colon cancer. She agrees to continue to take vit D OTC and will follow up for routine testing of vitamin D, at least 2-3 times per year. She was informed of the risk of over-replacement of vitamin D  and agrees to not increase her dose unless she discusses this with [name] first. Agrees to follow up with our clinic as directed.   Obesity Jamie [name] is currently in the action stage of change. As such, her goal is to continue with weight loss efforts She has agreed to follow the Category 2 plan Jamie [name] has been instructed to work up to a goal of 150 minutes of combined cardio and strengthening exercise per week for  weight loss and overall health benefits. We discussed the following Behavioral Modification Strategies today: work on meal planning and easy cooking plans and holiday eating strategies    Jamie [name] has agreed to follow up with our clinic in 5 weeks. She was informed of the importance of frequent follow up visits to maximize her success with intensive lifestyle modifications for her multiple health conditions.   OBESITY BEHAVIORAL INTERVENTION VISIT  Today's visit was # 17   Starting weight: 222 lb Starting date: 08/25/17 Today's weight : Weight: 161 lb (73 kg)  Today's date:06/01/18 Total lbs lost to date: 61 lb At least 15 minutes were spent on discussing the following behavioral intervention visit.   ASK: We discussed the diagnosis of obesity with Jamie [name] today and Jamie [name] agreed to give [name] permission to discuss obesity behavioral modification therapy today.  ASSESS: Jamie [name] has the diagnosis of obesity and her BMI today is 28.53 Jamie [name] is in the action stage of change   ADVISE: Jamie [name] was educated on the multiple health risks of obesity as well as the benefit of weight loss to improve her health. She was advised of the need for long term treatment and the importance of lifestyle modifications to improve her current health and to decrease her risk of future health problems.  AGREE: Multiple dietary modification options and treatment options were discussed and  Mystery agreed to follow the recommendations documented in the above note.  ARRANGE: [place] was educated on the importance of frequent visits to treat obesity as outlined per CMS and USPSTF guidelines and agreed to schedule her next follow up appointment today.  Jamie [name], am acting as transcriptionist for [name], PA-C I, [name], PA-C have reviewed above note and agree with its content

## 2018-06-08 ENCOUNTER — Emergency Department (HOSPITAL_COMMUNITY): Payer: Medicare Other

## 2018-06-08 ENCOUNTER — Encounter (HOSPITAL_COMMUNITY): Payer: Self-pay | Admitting: Emergency Medicine

## 2018-06-08 ENCOUNTER — Observation Stay (HOSPITAL_COMMUNITY): Payer: Medicare Other | Admitting: Anesthesiology

## 2018-06-08 ENCOUNTER — Other Ambulatory Visit: Payer: Self-pay

## 2018-06-08 ENCOUNTER — Encounter (HOSPITAL_COMMUNITY)
Admission: EM | Disposition: A | Discharge status: Home / Self Care | Payer: Self-pay | Source: Home / Self Care | Attending: Emergency Medicine

## 2018-06-08 ENCOUNTER — Inpatient Hospital Stay: Admit: 2018-06-08 | Payer: Medicare Other | Admitting: General Surgery

## 2018-06-08 ENCOUNTER — Observation Stay (HOSPITAL_COMMUNITY)
Admission: EM | Admit: 2018-06-08 | Discharge: 2018-06-09 | Disposition: A | Discharge status: Home / Self Care | Payer: Medicare Other | Attending: General Surgery | Admitting: General Surgery

## 2018-06-08 DIAGNOSIS — K5790 Diverticulosis of intestine, part unspecified, without perforation or abscess without bleeding: Secondary | ICD-10-CM | POA: Insufficient documentation

## 2018-06-08 DIAGNOSIS — J45909 Unspecified asthma, uncomplicated: Secondary | ICD-10-CM | POA: Diagnosis not present

## 2018-06-08 DIAGNOSIS — G43909 Migraine, unspecified, not intractable, without status migrainosus: Secondary | ICD-10-CM | POA: Diagnosis not present

## 2018-06-08 DIAGNOSIS — Z883 Allergy status to other anti-infective agents status: Secondary | ICD-10-CM | POA: Insufficient documentation

## 2018-06-08 DIAGNOSIS — Z79899 Other long term (current) drug therapy: Secondary | ICD-10-CM | POA: Insufficient documentation

## 2018-06-08 DIAGNOSIS — Z885 Allergy status to narcotic agent status: Secondary | ICD-10-CM | POA: Insufficient documentation

## 2018-06-08 DIAGNOSIS — M25519 Pain in unspecified shoulder: Secondary | ICD-10-CM | POA: Diagnosis not present

## 2018-06-08 DIAGNOSIS — K760 Fatty (change of) liver, not elsewhere classified: Secondary | ICD-10-CM | POA: Diagnosis not present

## 2018-06-08 DIAGNOSIS — K5730 Diverticulosis of large intestine without perforation or abscess without bleeding: Secondary | ICD-10-CM | POA: Insufficient documentation

## 2018-06-08 DIAGNOSIS — G2581 Restless legs syndrome: Secondary | ICD-10-CM | POA: Diagnosis not present

## 2018-06-08 DIAGNOSIS — Z85528 Personal history of other malignant neoplasm of kidney: Secondary | ICD-10-CM | POA: Insufficient documentation

## 2018-06-08 DIAGNOSIS — F419 Anxiety disorder, unspecified: Secondary | ICD-10-CM | POA: Diagnosis not present

## 2018-06-08 DIAGNOSIS — Z86010 Personal history of colonic polyps: Secondary | ICD-10-CM | POA: Diagnosis not present

## 2018-06-08 DIAGNOSIS — K3580 Unspecified acute appendicitis: Secondary | ICD-10-CM | POA: Diagnosis not present

## 2018-06-08 DIAGNOSIS — K581 Irritable bowel syndrome with constipation: Secondary | ICD-10-CM | POA: Diagnosis not present

## 2018-06-08 DIAGNOSIS — Z7984 Long term (current) use of oral hypoglycemic drugs: Secondary | ICD-10-CM | POA: Insufficient documentation

## 2018-06-08 DIAGNOSIS — E871 Hypo-osmolality and hyponatremia: Secondary | ICD-10-CM | POA: Diagnosis not present

## 2018-06-08 DIAGNOSIS — K219 Gastro-esophageal reflux disease without esophagitis: Secondary | ICD-10-CM | POA: Insufficient documentation

## 2018-06-08 DIAGNOSIS — K5909 Other constipation: Secondary | ICD-10-CM | POA: Insufficient documentation

## 2018-06-08 DIAGNOSIS — Z7982 Long term (current) use of aspirin: Secondary | ICD-10-CM | POA: Diagnosis not present

## 2018-06-08 DIAGNOSIS — E559 Vitamin D deficiency, unspecified: Secondary | ICD-10-CM | POA: Insufficient documentation

## 2018-06-08 DIAGNOSIS — Z6829 Body mass index (BMI) 29.0-29.9, adult: Secondary | ICD-10-CM | POA: Insufficient documentation

## 2018-06-08 DIAGNOSIS — M19072 Primary osteoarthritis, left ankle and foot: Secondary | ICD-10-CM | POA: Diagnosis not present

## 2018-06-08 DIAGNOSIS — R011 Cardiac murmur, unspecified: Secondary | ICD-10-CM | POA: Insufficient documentation

## 2018-06-08 DIAGNOSIS — G4733 Obstructive sleep apnea (adult) (pediatric): Secondary | ICD-10-CM | POA: Diagnosis not present

## 2018-06-08 DIAGNOSIS — Z886 Allergy status to analgesic agent status: Secondary | ICD-10-CM | POA: Insufficient documentation

## 2018-06-08 DIAGNOSIS — I10 Essential (primary) hypertension: Secondary | ICD-10-CM | POA: Diagnosis not present

## 2018-06-08 DIAGNOSIS — E119 Type 2 diabetes mellitus without complications: Secondary | ICD-10-CM | POA: Diagnosis not present

## 2018-06-08 DIAGNOSIS — Z8673 Personal history of transient ischemic attack (TIA), and cerebral infarction without residual deficits: Secondary | ICD-10-CM | POA: Diagnosis not present

## 2018-06-08 HISTORY — PX: LAPAROSCOPIC APPENDECTOMY: SHX408

## 2018-06-08 LAB — CBC
HCT: 41.1 % (ref 36.0–46.0)
Hemoglobin: 13 g/dL (ref 12.0–15.0)
MCH: 30.7 pg (ref 26.0–34.0)
MCHC: 31.6 g/dL (ref 30.0–36.0)
MCV: 96.9 fL (ref 80.0–100.0)
Platelets: 236 10*3/uL (ref 150–400)
RBC: 4.24 MIL/uL (ref 3.87–5.11)
RDW: 13.5 % (ref 11.5–15.5)
WBC: 5.6 10*3/uL (ref 4.0–10.5)
nRBC: 0 % (ref 0.0–0.2)

## 2018-06-08 LAB — COMPREHENSIVE METABOLIC PANEL
ALT: 20 U/L (ref 0–44)
AST: 17 U/L (ref 15–41)
Albumin: 4.2 g/dL (ref 3.5–5.0)
Alkaline Phosphatase: 52 U/L (ref 38–126)
Anion gap: 8 (ref 5–15)
BUN: 17 mg/dL (ref 8–23)
CO2: 27 mmol/L (ref 22–32)
Calcium: 10.1 mg/dL (ref 8.9–10.3)
Chloride: 106 mmol/L (ref 98–111)
Creatinine, Ser: 0.69 mg/dL (ref 0.44–1.00)
GFR calc Af Amer: >60 mL/min (ref >60)
GFR calc non Af Amer: >60 mL/min (ref >60)
Glucose, Bld: 105 mg/dL — ABNORMAL HIGH (ref 70–99)
Potassium: 4.6 mmol/L (ref 3.5–5.1)
Sodium: 141 mmol/L (ref 135–145)
Total Bilirubin: 0.7 mg/dL (ref 0.3–1.2)
Total Protein: 6.9 g/dL (ref 6.5–8.1)

## 2018-06-08 LAB — URINALYSIS, ROUTINE W REFLEX MICROSCOPIC
Bilirubin Urine: NEGATIVE
Glucose, UA: NEGATIVE mg/dL
Hgb urine dipstick: NEGATIVE
Ketones, ur: NEGATIVE mg/dL
Leukocytes, UA: NEGATIVE
Nitrite: NEGATIVE
Protein, ur: NEGATIVE mg/dL
Specific Gravity, Urine: 1.01 (ref 1.005–1.030)
pH: 6 (ref 5.0–8.0)

## 2018-06-08 LAB — LIPASE, BLOOD: Lipase: 32 U/L (ref 11–51)

## 2018-06-08 SURGERY — APPENDECTOMY, LAPAROSCOPIC
Anesthesia: General | Site: Abdomen

## 2018-06-08 MED ORDER — DEXAMETHASONE SODIUM PHOSPHATE 4 MG/ML IJ SOLN
INTRAMUSCULAR | Status: DC | PRN
  Administered 2018-06-08: 5 mg via INTRAVENOUS

## 2018-06-08 MED ORDER — SODIUM CHLORIDE (PF) 0.9 % IJ SOLN
INTRAMUSCULAR | Status: AC
  Filled 2018-06-08: qty 50

## 2018-06-08 MED ORDER — SPIRONOLACTONE 25 MG PO TABS
50.0000 mg | ORAL_TABLET | Freq: Every day | ORAL | Status: DC
  Administered 2018-06-09: 50 mg via ORAL
  Filled 2018-06-08: qty 2

## 2018-06-08 MED ORDER — DIPHENHYDRAMINE HCL 12.5 MG/5ML PO ELIX: 12.5000 mg | ORAL_SOLUTION | Freq: Four times a day (QID) | ORAL | Status: DC | PRN

## 2018-06-08 MED ORDER — PIPERACILLIN-TAZOBACTAM 3.375 G IVPB
3.3750 g | Freq: Three times a day (TID) | INTRAVENOUS | Status: DC
  Administered 2018-06-09: 3.375 g via INTRAVENOUS
  Filled 2018-06-08: qty 50

## 2018-06-08 MED ORDER — IOPAMIDOL (ISOVUE-300) INJECTION 61%
100.0000 mL | Freq: Once | INTRAVENOUS | Status: AC | PRN
  Administered 2018-06-08: 100 mL via INTRAVENOUS

## 2018-06-08 MED ORDER — LIDOCAINE 2% (20 MG/ML) 5 ML SYRINGE
INTRAMUSCULAR | Status: AC
  Filled 2018-06-08: qty 5

## 2018-06-08 MED ORDER — PANTOPRAZOLE SODIUM 40 MG IV SOLR: 40.0000 mg | INTRAVENOUS | Status: DC

## 2018-06-08 MED ORDER — CILIDINIUM-CHLORDIAZEPOXIDE 2.5-5 MG PO CAPS
1.0000 | ORAL_CAPSULE | Freq: Two times a day (BID) | ORAL | Status: DC
  Administered 2018-06-09: 1 via ORAL
  Filled 2018-06-08: qty 1

## 2018-06-08 MED ORDER — POTASSIUM CHLORIDE 2 MEQ/ML IV SOLN
INTRAVENOUS | Status: DC
  Administered 2018-06-08 – 2018-06-09 (×2): via INTRAVENOUS
  Filled 2018-06-08 (×4): qty 1000

## 2018-06-08 MED ORDER — KETOROLAC TROMETHAMINE 15 MG/ML IJ SOLN
15.0000 mg | Freq: Once | INTRAMUSCULAR | Status: AC
  Administered 2018-06-08: 15 mg via INTRAVENOUS
  Filled 2018-06-08: qty 1

## 2018-06-08 MED ORDER — FENTANYL CITRATE (PF) 100 MCG/2ML IJ SOLN
25.0000 ug | INTRAMUSCULAR | Status: DC | PRN
  Administered 2018-06-08: 25 ug via INTRAVENOUS

## 2018-06-08 MED ORDER — PANTOPRAZOLE SODIUM 40 MG IV SOLR
40.0000 mg | Freq: Two times a day (BID) | INTRAVENOUS | Status: DC
  Administered 2018-06-09: 40 mg via INTRAVENOUS
  Filled 2018-06-08: qty 40

## 2018-06-08 MED ORDER — FENTANYL CITRATE (PF) 100 MCG/2ML IJ SOLN: 25.0000 ug | INTRAMUSCULAR | Status: DC | PRN

## 2018-06-08 MED ORDER — SUGAMMADEX SODIUM 200 MG/2ML IV SOLN
INTRAVENOUS | Status: DC | PRN
  Administered 2018-06-08: 300 mg via INTRAVENOUS

## 2018-06-08 MED ORDER — HYDROCODONE-ACETAMINOPHEN 5-325 MG PO TABS
1.0000 | ORAL_TABLET | ORAL | Status: DC | PRN
  Administered 2018-06-09: 1 via ORAL
  Filled 2018-06-08: qty 1

## 2018-06-08 MED ORDER — DIPHENHYDRAMINE HCL 50 MG/ML IJ SOLN
12.5000 mg | Freq: Four times a day (QID) | INTRAMUSCULAR | Status: DC | PRN
  Administered 2018-06-08: 12.5 mg via INTRAVENOUS

## 2018-06-08 MED ORDER — FENTANYL CITRATE (PF) 100 MCG/2ML IJ SOLN
INTRAMUSCULAR | Status: AC
  Filled 2018-06-08: qty 2

## 2018-06-08 MED ORDER — PROPOFOL 10 MG/ML IV BOLUS
INTRAVENOUS | Status: AC
  Filled 2018-06-08: qty 40

## 2018-06-08 MED ORDER — PROMETHAZINE HCL 25 MG/ML IJ SOLN: 6.2500 mg | INTRAMUSCULAR | Status: DC | PRN

## 2018-06-08 MED ORDER — METRONIDAZOLE IN NACL 5-0.79 MG/ML-% IV SOLN: 500.0000 mg | Freq: Three times a day (TID) | INTRAVENOUS | Status: DC

## 2018-06-08 MED ORDER — ROCURONIUM BROMIDE 10 MG/ML (PF) SYRINGE
PREFILLED_SYRINGE | INTRAVENOUS | Status: DC | PRN
  Administered 2018-06-08: 50 mg via INTRAVENOUS
  Administered 2018-06-08: 10 mg via INTRAVENOUS

## 2018-06-08 MED ORDER — BUPIVACAINE-EPINEPHRINE 0.25% -1:200000 IJ SOLN
INTRAMUSCULAR | Status: DC | PRN
  Administered 2018-06-08: 20 mL

## 2018-06-08 MED ORDER — ONDANSETRON HCL 4 MG/2ML IJ SOLN
INTRAMUSCULAR | Status: AC
  Filled 2018-06-08: qty 2

## 2018-06-08 MED ORDER — HYDROMORPHONE HCL 1 MG/ML IJ SOLN: 0.5000 mg | INTRAMUSCULAR | Status: DC | PRN

## 2018-06-08 MED ORDER — BUPIVACAINE-EPINEPHRINE (PF) 0.25% -1:200000 IJ SOLN
INTRAMUSCULAR | Status: AC
  Filled 2018-06-08: qty 30

## 2018-06-08 MED ORDER — KETOROLAC TROMETHAMINE 30 MG/ML IJ SOLN
15.0000 mg | Freq: Four times a day (QID) | INTRAMUSCULAR | Status: DC
  Administered 2018-06-08 – 2018-06-09 (×4): 15 mg via INTRAVENOUS
  Filled 2018-06-08 (×4): qty 1

## 2018-06-08 MED ORDER — SUGAMMADEX SODIUM 200 MG/2ML IV SOLN
INTRAVENOUS | Status: AC
  Filled 2018-06-08: qty 2

## 2018-06-08 MED ORDER — ROCURONIUM BROMIDE 100 MG/10ML IV SOLN
INTRAVENOUS | Status: AC
  Filled 2018-06-08: qty 1

## 2018-06-08 MED ORDER — DIPHENHYDRAMINE HCL 50 MG/ML IJ SOLN
INTRAMUSCULAR | Status: AC
  Filled 2018-06-08: qty 1

## 2018-06-08 MED ORDER — LACTATED RINGERS IV SOLN
INTRAVENOUS | Status: DC | PRN
  Administered 2018-06-08: 21:00:00 via INTRAVENOUS

## 2018-06-08 MED ORDER — ONDANSETRON 4 MG PO TBDP: 4.0000 mg | ORAL_TABLET | Freq: Four times a day (QID) | ORAL | Status: DC | PRN

## 2018-06-08 MED ORDER — IOPAMIDOL (ISOVUE-300) INJECTION 61%
INTRAVENOUS | Status: AC
  Filled 2018-06-08: qty 100

## 2018-06-08 MED ORDER — SODIUM CHLORIDE 0.9 % IV SOLN: 2.0000 g | INTRAVENOUS | Status: DC

## 2018-06-08 MED ORDER — DEXAMETHASONE SODIUM PHOSPHATE 10 MG/ML IJ SOLN
INTRAMUSCULAR | Status: AC
  Filled 2018-06-08: qty 1

## 2018-06-08 MED ORDER — LIDOCAINE 2% (20 MG/ML) 5 ML SYRINGE
INTRAMUSCULAR | Status: DC | PRN
  Administered 2018-06-08: 50 mg via INTRAVENOUS

## 2018-06-08 MED ORDER — PIPERACILLIN-TAZOBACTAM 3.375 G IVPB 30 MIN
3.3750 g | Freq: Once | INTRAVENOUS | Status: AC
  Administered 2018-06-08: 3.375 g via INTRAVENOUS
  Filled 2018-06-08: qty 50

## 2018-06-08 MED ORDER — SUCCINYLCHOLINE CHLORIDE 200 MG/10ML IV SOSY
PREFILLED_SYRINGE | INTRAVENOUS | Status: DC | PRN
  Administered 2018-06-08: 100 mg via INTRAVENOUS

## 2018-06-08 MED ORDER — SUCCINYLCHOLINE CHLORIDE 200 MG/10ML IV SOSY
PREFILLED_SYRINGE | INTRAVENOUS | Status: AC
  Filled 2018-06-08: qty 10

## 2018-06-08 MED ORDER — CLONAZEPAM 0.5 MG PO TABS: 0.5000 mg | ORAL_TABLET | Freq: Every evening | ORAL | Status: DC | PRN

## 2018-06-08 MED ORDER — FENTANYL CITRATE (PF) 100 MCG/2ML IJ SOLN
INTRAMUSCULAR | Status: DC | PRN
  Administered 2018-06-08 (×4): 50 ug via INTRAVENOUS

## 2018-06-08 MED ORDER — PROPOFOL 10 MG/ML IV BOLUS
INTRAVENOUS | Status: DC | PRN
  Administered 2018-06-08: 150 mg via INTRAVENOUS

## 2018-06-08 MED ORDER — ONDANSETRON HCL 4 MG/2ML IJ SOLN
4.0000 mg | Freq: Four times a day (QID) | INTRAMUSCULAR | Status: DC | PRN
  Administered 2018-06-08: 4 mg via INTRAVENOUS

## 2018-06-08 SURGICAL SUPPLY — 33 items
ADH SKN CLS APL DERMABOND .7 (GAUZE/BANDAGES/DRESSINGS) ×1
APPLIER CLIP ROT 10 11.4 M/L (STAPLE)
APR CLP MED LRG 11.4X10 (STAPLE)
BAG SPEC RTRVL LRG 6X4 10 (ENDOMECHANICALS) ×1
CABLE HIGH FREQUENCY MONO STRZ (ELECTRODE) ×3 IMPLANT
CHLORAPREP W/TINT 26ML (MISCELLANEOUS) ×3 IMPLANT
CLIP APPLIE ROT 10 11.4 M/L (STAPLE) IMPLANT
COVER WAND RF STERILE (DRAPES) IMPLANT
CUTTER FLEX LINEAR 45M (STAPLE) ×2 IMPLANT
DECANTER SPIKE VIAL GLASS SM (MISCELLANEOUS) ×1 IMPLANT
DERMABOND ADVANCED (GAUZE/BANDAGES/DRESSINGS) ×2
DERMABOND ADVANCED .7 DNX12 (GAUZE/BANDAGES/DRESSINGS) ×1 IMPLANT
ELECT REM PT RETURN 15FT ADLT (MISCELLANEOUS) ×3 IMPLANT
ENDOLOOP SUT PDS II  0 18 (SUTURE)
ENDOLOOP SUT PDS II 0 18 (SUTURE) IMPLANT
GLOVE BIO SURGEON STRL SZ7.5 (GLOVE) ×3 IMPLANT
GOWN STRL REUS W/TWL XL LVL3 (GOWN DISPOSABLE) ×6 IMPLANT
KIT BASIN OR (CUSTOM PROCEDURE TRAY) ×3 IMPLANT
POUCH SPECIMEN RETRIEVAL 10MM (ENDOMECHANICALS) ×3 IMPLANT
RELOAD 45 VASCULAR/THIN (ENDOMECHANICALS) IMPLANT
RELOAD STAPLE 45 2.5 WHT GRN (ENDOMECHANICALS) IMPLANT
RELOAD STAPLE 45 3.5 BLU ETS (ENDOMECHANICALS) IMPLANT
RELOAD STAPLE TA45 3.5 REG BLU (ENDOMECHANICALS) ×3 IMPLANT
SCISSORS LAP 5X35 DISP (ENDOMECHANICALS) ×1 IMPLANT
SET IRRIG TUBING LAPAROSCOPIC (IRRIGATION / IRRIGATOR) ×3 IMPLANT
SHEARS HARMONIC ACE PLUS 36CM (ENDOMECHANICALS) ×3 IMPLANT
SUT MNCRL AB 4-0 PS2 18 (SUTURE) ×3 IMPLANT
TOWEL OR 17X26 10 PK STRL BLUE (TOWEL DISPOSABLE) ×3 IMPLANT
TRAY FOLEY MTR SLVR 16FR STAT (SET/KITS/TRAYS/PACK) ×2 IMPLANT
TRAY LAPAROSCOPIC (CUSTOM PROCEDURE TRAY) ×3 IMPLANT
TROCAR BLADELESS OPT 5 100 (ENDOMECHANICALS) ×3 IMPLANT
TROCAR XCEL BLUNT TIP 100MML (ENDOMECHANICALS) ×3 IMPLANT
TUBING INSUF HEATED (TUBING) ×3 IMPLANT

## 2018-06-08 NOTE — Op Note (Signed)
06/08/2018  9:41 PM  PATIENT:  Jamie [name]  72 y.o. female  PRE-OPERATIVE DIAGNOSIS:  acute appendicitis  POST-OPERATIVE DIAGNOSIS:  acute appendicitis  PROCEDURE:  Procedure(s): APPENDECTOMY LAPAROSCOPIC (N/A)  SURGEON:  Surgeon(s) and Role:    * Jovita Kussmaul, MD - Primary  PHYSICIAN ASSISTANT:   ASSISTANTS: none   ANESTHESIA:   local and general  EBL:  5 mL   BLOOD ADMINISTERED:none  DRAINS: none   LOCAL MEDICATIONS USED:  MARCAINE     SPECIMEN:  Source of Specimen:  appendix  DISPOSITION OF SPECIMEN:  PATHOLOGY  COUNTS:  YES  TOURNIQUET:  * No tourniquets in log *  DICTATION: .Dragon Dictation   After informed consent was obtained patient was brought to the operating room placed in the supine position on the operating room table. After adequate induction of general anesthesia the patient's abdomen was prepped with ChloraPrep, allowed to dry, and draped in usual sterile manner.  An appropriate timeout was performed. The area below the umbilicus was infiltrated with quarter percent Marcaine. A small incision was made with a 15 blade knife. This incision was carried down through the subcutaneous tissue bluntly with a hemostat and [employer] retractors until the linea alba was identified. The linea alba was incised with a 15 blade knife. Each side was grasped Coker clamps and elevated anteriorly. The preperitoneal space was probed bluntly with a hemostat until the peritoneum was opened and access was gained to the abdominal cavity. A 0 Vicryl purse string stitch was placed in the fascia surrounding the opening. A Hassan cannula was placed through the opening and anchored in place with the previously placed Vicryl purse string stitch. The laparoscope was placed through the [facility] cannula. The abdomen was insufflated with carbon dioxide without difficulty. Next the suprapubic area was infiltrated with quarter percent Marcaine. A small incision was made with a 15 blade knife.  A 5 mm port was placed bluntly through this incision into the abdominal cavity. A site was then chosen between the 2 port for placement of a 5 mm port. The area was infiltrated with quarter percent Marcaine. A small stab incision was made with a 15 blade knife. A 5 mm port was placed bluntly through this incision and the abdominal cavity under direct vision. The laparoscope was then moved to the suprapubic port. Using a Glassman grasper and harmonic scalpel the right lower quadrant was inspected. The appendix was readily identified. The appendix was elevated anteriorly and the mesoappendix was taken down sharply with the harmonic scalpel. Once the base of the appendix where it joined the cecum was identified and cleared of any tissue then a laparoscopic GIA blue load 6 row stapler was placed through the [facility] cannula. The stapler was placed across the base of the appendix clamped and fired thereby dividing the base of the appendix between staple lines. A laparoscopic bag was then inserted through the [facility] cannula. The appendix was placed within the bag and the bag was sealed. The abdomen was then irrigated with copious amounts of saline until the effluent was clear. No other abnormalities were noted. The appendix and bag were removed with the [facility] cannula through the infraumbilical port without difficulty. The fascial defect was closed with the previously placed Vicryl pursestring stitch as well as with another interrupted 0 Vicryl figure-of-eight stitch. The rest of the ports were removed under direct vision and were found to be hemostatic. The gas was allowed to escape. The skin incisions were closed with interrupted 4-0  Monocryl subcuticular stitches. Dermabond dressings were applied. The patient tolerated the procedure well. At the end of the case all needle sponge and instrument counts were correct. The patient was then awakened and taken to recovery in stable condition.  PLAN OF CARE: Admit for  overnight observation  PATIENT DISPOSITION:  PACU - hemodynamically stable.   Delay start of Pharmacological VTE agent (>24hrs) due to surgical blood loss or risk of bleeding: no

## 2018-06-08 NOTE — Progress Notes (Signed)
Pharmacy Antibiotic Note  Jamie [name] is a 72 y.o. female admitted on 06/08/2018 with acute appendicitis.  Pharmacy has been consulted for Zosyn dosing.  Plan: Zosyn 3.375gm IV q8h (4hr extended infusions) No further dose adjustments needed Pharmacy will sign off  Height: 5' 3"  (160 cm) Weight: 164 lb (74.4 kg) IBW/kg (Calculated) : 52.4  Temp (24hrs), Avg:99.3 F (37.4 C), Min:99.3 F (37.4 C), Max:99.3 F (37.4 C)  Recent Labs  Lab 06/08/18 1138  WBC 5.6  CREATININE 0.69    Estimated Creatinine Clearance: 63.2 mL/min (by C-G formula based on SCr of 0.69 mg/dL).    Allergies  Allergen Reactions  . Buprenorphine Hcl Rash and Shortness Of Breath  . Morphine And Related Shortness Of Breath and Rash  . Morphine And Related Shortness Of Breath  . Aspirin Other (See Comments)    REACTION: ulcers  Can take 81 mg  . Avelox [Moxifloxacin Hcl In Nacl] Hives  . Diflucan [Fluconazole] Hives    Whelps and rash c skin peeling  . Diflucan [Fluconazole] Hives  . Latex Rash  . Moxifloxacin Palpitations    Tachycardia     Antimicrobials this admission: 12/2 Zosyn >>  Dose adjustments this admission: None  Microbiology results: None  Thank you for allowing pharmacy to be a part of this patient's care.  Peggyann Juba, PharmD, BCPS Pager: [phone] 06/08/2018 5:22 PM

## 2018-06-08 NOTE — ED Notes (Signed)
Patient to be transported to pre-op. No consent orders have been placed so no consent has been filled out. Patient last PO intake was yesterday at 1600. Patient belongings sent to patient's room. Patient updated her husband of her POC.

## 2018-06-08 NOTE — Transfer of Care (Signed)
Immediate Anesthesia Transfer of Care Note  Patient: Jamie [name]  Procedure(s) Performed: APPENDECTOMY LAPAROSCOPIC (N/A Abdomen)  Patient Location: PACU  Anesthesia Type:General  Level of Consciousness: awake, alert , oriented and patient cooperative  Airway & Oxygen Therapy: Patient Spontanous Breathing and Patient connected to face mask  Post-op Assessment: Report given to RN and Post -op Vital signs reviewed and stable  Post vital signs: Reviewed and stable  Last Vitals:  Vitals Value Taken Time  BP    Temp    Pulse    Resp    SpO2      Last Pain:  Vitals:   06/08/18 1508  TempSrc:   PainSc: 8          Complications: No apparent anesthesia complications

## 2018-06-08 NOTE — Anesthesia Preprocedure Evaluation (Addendum)
Anesthesia Evaluation  Patient identified by MRN, date of birth, ID band Patient awake    Reviewed: Allergy & Precautions, NPO status , Patient's Chart, lab work & pertinent test results  History of Anesthesia Complications Negative for: history of anesthetic complications  Airway Mallampati: II  TM Distance: >3 FB Neck ROM: Full    Dental no notable dental hx. (+) Dental Advisory Given   Pulmonary asthma , sleep apnea and Continuous Positive Airway Pressure Ventilation ,    Pulmonary exam normal        Cardiovascular hypertension, Pt. on medications Normal cardiovascular exam  Study Highlights    Nuclear stress EF: 59%.  There was no ST segment deviation noted during stress.  Defect 1: There is a small defect of moderate severity present in the mid anteroseptal location. Consistent with breast attenuation artifact.  Defect 2: There is a small defect of moderate severity present in the apex location. Consistent with apical thinning.  This is a low risk study.  The left ventricular ejection fraction is normal (55-65%).    Impressions:  - Normal LV size with EF 55-60%. Normal RV size and systolic   function. No significant valvular abnormalities. Biatrial   enlargement. Negative bubble study.   Neuro/Psych PSYCHIATRIC DISORDERS Anxiety TIA   GI/Hepatic Neg liver ROS, GERD  Medicated,  Endo/Other  diabetes  Renal/GU S/p partial nephrectomy  negative genitourinary   Musculoskeletal negative musculoskeletal ROS (+)   Abdominal   Peds negative pediatric ROS (+)  Hematology negative hematology ROS (+)   Anesthesia Other Findings   Reproductive/Obstetrics negative OB ROS                           Anesthesia Physical Anesthesia Plan  ASA: III and emergent  Anesthesia Plan: General   Post-op Pain Management:    Induction: Intravenous, Rapid sequence and Cricoid pressure  planned  PONV Risk Score and Plan: 4 or greater and Ondansetron, Dexamethasone and Diphenhydramine  Airway Management Planned: Oral ETT  Additional Equipment:   Intra-op Plan:   Post-operative Plan: Extubation in OR  Informed Consent: I have reviewed the patients History and Physical, chart, labs and discussed the procedure including the risks, benefits and alternatives for the proposed anesthesia with the patient or authorized representative who has indicated his/her understanding and acceptance.   Dental advisory given  Plan Discussed with: CRNA and Anesthesiologist  Anesthesia Plan Comments:        Anesthesia Quick Evaluation

## 2018-06-08 NOTE — ED Triage Notes (Signed)
Pt complaint of several abdominal pain with associated nausea; hx of diverticulitis.

## 2018-06-08 NOTE — ED Provider Notes (Signed)
[facility] DEPT Provider Note   CSN: [id number] Arrival date & time: 06/08/18  1044     History   Chief Complaint Chief Complaint  Patient presents with  . Abdominal Pain    HPI Jamie [name] is a 72 y.o. female.  HPI   72 year old female with abdominal pain.  Gradual onset about 3 days ago.  Worsening since then.  Initially felt the pain in the left upper quadrant.  Has since spread into her lower abdomen and now seems most maximal suprapubically.  Pain is now constant.  Worse with certain movements, certain positions and walking.  She has a past history of diverticulitis and is concerned that she may have it again.  No fevers or chills.  No acute urinary complaints.  No blood in her stool.  Past Medical History:  Diagnosis Date  . Allergic rhinitis   . Anxiety   . Arthritis    left foot  . Asthma   . Back pain   . Colon polyps    adenomatous  . Constipation   . Diverticulosis   . Dysrhythmia 1999   Dr Johnsie Cancel 2003  . Elevated LFTs   . Fatty liver disease, nonalcoholic   . GERD (gastroesophageal reflux disease)   . Headache(784.0)    migraines with vision changes but no pain  . Heart murmur   . Hypertension   . IBS (irritable bowel syndrome)   . Joint pain   . Migraines   . Muscle cramps   . Obesity   . Palpitations   . Pneumonia 2007  . Renal cell carcinoma ([place]) 2013   minimal partial nephrectomy  . Restless leg syndrome   . Sleep apnea    moderate; wears CPAP, uncertain about setting  . TIA (transient ischemic attack) 09/11/2014  . Ulcer   . Urinary incontinence   . Vitamin D deficiency     Patient Active Problem List   Diagnosis Date Noted  . Hyponatremia 11/06/2017  . Other fatigue 08/25/2017  . Shortness of breath on exertion 08/25/2017  . Vitamin D deficiency 08/25/2017  . Hyperglycemia 08/25/2017  . 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 TIA (transient ischemic attack) 12/19/2016    . Common migraine 12/19/2016  . Obstructive sleep apnea on CPAP 12/19/2016  . Acute urinary retention 11/21/2016  . 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 Diverticulitis 11/20/2016  . Pain in left ankle and joints of left foot 08/13/2016  . Pain and swelling of left ankle 08/13/2016  . At risk for polypharmacy 10/02/2015  . Intractable migraine with aura with status migrainosus 10/02/2015  . Other specified transient cerebral ischemias 09/16/2014  . 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 Morbid obesity ([place]) 09/16/2014  . Headache 05/03/2013  . Preop exam for internal medicine 03/10/2012  . Kidney mass 03/10/2012  . UTI (lower urinary tract infection) 02/13/2012  . Excessive tearing 12/16/2011  . Leg pain 09/20/2011  . Acute bronchitis 06/27/2011  . Wheezing 06/27/2011  . Rosacea 05/07/2011  . Shoulder pain, right 05/07/2011  . OSA (obstructive sleep apnea) 01/31/2011  . Confusion state 11/26/2010  . ABNORMAL ELECTROCARDIOGRAM 08/28/2010  . Tinnitus 08/09/2010  . SINUSITIS, CHRONIC 08/09/2010  . Dizziness and giddiness 01/18/2010  . HYPERGLYCEMIA 08/23/2009  . LIVER FUNCTION TESTS, ABNORMAL, HX OF 08/23/2009  . Fatigue 01/16/2009  . PERSONAL HX COLONIC POLYPS 09/21/2008  . ADENOMATOUS COLONIC POLYP 09/20/2008  . ALLERGIC RHINITIS 04/28/2008  . RESTRICTIVE LUNG DISEASE 04/28/2008  .  CRAMPS,LEG 09/28/2007  . OBESITY 04/01/2007  . VOCAL CORD DISORDER 04/01/2007  . REDUCTION MAMMOPLASTY, HX OF 04/01/2007  . RESTLESS LEG SYNDROME 02/02/2007  . Essential hypertension 02/02/2007  . MITRAL VALVE PROLAPSE 02/02/2007  . GERD 02/02/2007  . Irritable bowel syndrome 02/02/2007  . BREAST CYST 02/02/2007    Past Surgical History:  Procedure Laterality Date  . BREAST REDUCTION SURGERY  Jan 1991  . ELBOW SURGERY Left   . KIDNEY SURGERY Right October 2013    Tumor removed   . TONSILLECTOMY    . VAGINAL HYSTERECTOMY  1979   partial      OB History    Gravida  2   Para  0   Term  0   Preterm  0   AB  0   Living  2     SAB  0   TAB  0   Ectopic  0   Multiple      Live Births               Home Medications    Prior to Admission medications   Medication Sig Start Date End Date Taking? Authorizing Provider  aspirin EC 81 MG tablet Take 1 tablet (81 mg total) by mouth daily. 09/16/14   Dohmeier, Asencion Partridge, MD  BIOTIN PO Take 1 capsule by mouth at bedtime.     [provider]  Cholecalciferol (VITAMIN D3) 2000 units TABS Take 1 tablet by mouth daily.    [provider]  clidinium-chlordiazePOXIDE (LIBRAX) 5-2.5 MG capsule Take 1 capsule by mouth 2 (two) times daily. Patient taking differently: Take 2 capsules by mouth at bedtime.  09/22/17   Irene Shipper, MD  conjugated estrogens (PREMARIN) vaginal cream Place 1 Applicatorful vaginally every other day.     [provider]  diphenhydrAMINE (BENADRYL) 25 MG tablet Take 25 mg by mouth at bedtime.     [provider]  esomeprazole (NEXIUM) 40 MG capsule TAKE 1 CAPSULE TWICE A DAY BEFORE MEALS 09/24/17   Irene Shipper, MD  fluticasone [facility]) 50 MCG/ACT nasal spray Place 2 sprays into the nose daily as needed for rhinitis or allergies.    [provider]  linaclotide (LINZESS) 145 MCG CAPS capsule Take 1 capsule (145 mcg total) by mouth daily. Patient taking differently: Take 145 mcg by mouth daily as needed (cramping).  10/30/16   Irene Shipper, MD  metFORMIN (GLUCOPHAGE) 500 MG tablet Take 1 tablet (500 mg total) by mouth daily with breakfast. 06/01/18   [name], PA-C  Potassium 99 MG TABS Take 1 tablet by mouth daily.    [provider]  Prenatal Vit-Fe Fumarate-FA (PRENATAL MULTIVITAMIN) TABS tablet Take 1 tablet by mouth daily at 12 noon.    [provider]  Probiotic Product (PROBIOTIC-10 PO) Take 1 capsule by mouth daily.    [provider]  spironolactone (ALDACTONE) 50 MG  tablet 50 mg daily. 12/01/17   [provider]    Family History Family History  Problem Relation Age of Onset  . Heart disease Father 51  . Lung cancer Father        smoker  . Hypertension Mother   . Anxiety disorder Mother   . Heart disease Brother   . Hypertension Other   . Colon cancer Cousin   . Allergies Daughter   . Allergies Brother   . Melanoma Daughter   . Lung cancer Sister   . Liver disease Neg Hx   .  Asthma Neg Hx   . Esophageal cancer Neg Hx   . Rectal cancer Neg Hx   . Stomach cancer Neg Hx     Social History Social History   Tobacco Use  . Smoking status: Never Smoker  . Smokeless tobacco: Never Used  Substance Use Topics  . Alcohol use: Yes    Alcohol/week: 2.0 - 3.0 standard drinks    Types: 1 - 2 Glasses of wine, 1 Shots of liquor per week  . Drug use: No     Allergies   Buprenorphine hcl; Morphine and related; Morphine and related; Aspirin; Avelox [moxifloxacin hcl in nacl]; Diflucan [fluconazole]; Diflucan [fluconazole]; Latex; and Moxifloxacin   Review of Systems Review of Systems  All systems reviewed and negative, other than as noted in HPI.  Physical Exam Updated Vital Signs BP 125/90 (BP Location: Left Arm)   Pulse 77   Temp 99.3 F (37.4 C) (Oral)   Resp 16   Ht 5' 3"  (1.6 m)   Wt 74.4 kg   SpO2 97%   BMI 29.05 kg/m   Physical Exam  Constitutional: She appears well-developed and well-nourished. No distress.  HENT:  Head: Normocephalic and atraumatic.  Eyes: Conjunctivae are normal. Right eye exhibits no discharge. Left eye exhibits no discharge.  Neck: Neck supple.  Cardiovascular: Normal rate, regular rhythm and normal heart sounds. Exam reveals no gallop and no friction rub.  No murmur heard. Pulmonary/Chest: Effort normal and breath sounds normal. No respiratory distress.  Abdominal: Soft. There is tenderness.  Diffusely tender, but worse across the lower abdomen.  No rebound or guarding.  Musculoskeletal:  She exhibits no edema or tenderness.  Neurological: She is alert.  Skin: Skin is warm and dry.  Psychiatric: She has a normal mood and affect. Her behavior is normal. Thought content normal.  Nursing note and vitals reviewed.    ED Treatments / Results  Labs (all labs ordered are listed, but only abnormal results are displayed) Labs Reviewed  COMPREHENSIVE METABOLIC PANEL - Abnormal; Notable for the following components:      Result Value   Glucose, Bld 105 (*)    All other components within normal limits  LIPASE, BLOOD  CBC  URINALYSIS, ROUTINE W REFLEX MICROSCOPIC    EKG None  Radiology Ct Abdomen Pelvis W Contrast  Result Date: 06/08/2018 CLINICAL DATA:  Abdominal pain for 3 days. Prior history of partial right nephrectomy for renal cell carcinoma. EXAM: CT ABDOMEN AND PELVIS WITH CONTRAST TECHNIQUE: Multidetector CT imaging of the abdomen and pelvis was performed using the standard protocol following bolus administration of intravenous contrast. CONTRAST:  149m ISOVUE-300 IOPAMIDOL (ISOVUE-300) INJECTION 61% COMPARISON:  CT scan 11/20/2016 FINDINGS: Lower chest: The lung bases are clear except for dependent subpleural atelectasis. No worrisome pulmonary lesions. The heart is normal in size. No pericardial effusion. Hepatobiliary: No focal hepatic lesions or intrahepatic biliary dilatation. The gallbladder is normal. No common bile duct dilatation. Pancreas: No mass, inflammation or ductal dilatation. Spleen: Normal size.  No focal lesions. Adrenals/Urinary Tract: The adrenal glands and kidneys are unremarkable. There are surgical changes involving the right kidney from partial nephrectomy. No findings for recurrent tumor. No hydronephrosis. Stomach/Bowel: The stomach, duodenum, small bowel and colon are unremarkable. No acute inflammatory changes, mass lesions or obstructive findings. Sigmoid colon diverticulosis without findings for acute diverticulitis. The appendix is distended and  fluid-filled and demonstrates mucosal enhancement. There is also mild periappendiceal inflammatory changes and a small appendicoliths. Findings consistent with acute appendicitis. Vascular/Lymphatic: The  aorta is normal in caliber. No dissection. The branch vessels are patent. The major venous structures are patent. No mesenteric or retroperitoneal mass or adenopathy. Small scattered lymph nodes are noted. Reproductive: The uterus is surgically absent. Both ovaries are still present and appear normal. Other: No pelvic mass or adenopathy. No free pelvic fluid collections. No inguinal mass or adenopathy. No abdominal wall hernia or subcutaneous lesions. Musculoskeletal: No significant bony findings. IMPRESSION: 1. CT findings consistent with acute appendicitis. No findings for rupture. Small appendicoliths noted. 2. No other significant abdominal/pelvic findings. Electronically Signed   By: Marijo Sanes M.D.   On: 06/08/2018 15:57    Procedures Procedures (including critical care time)  Medications Ordered in ED Medications - No data to display   Initial Impression / Assessment and Plan / ED Course  I have reviewed the triage vital signs and the nursing notes.  Pertinent labs & imaging results that were available during my care of the patient were reviewed by me and considered in my medical decision making (see chart for details).     72 year old female with abdominal pain.  History of diverticulitis.  May potentially the same.  She is tender across her lower abdomen though.  Will CT to further evaluate.  Final Clinical Impressions(s) / ED Diagnoses   Final diagnoses:  Acute appendicitis, unspecified acute appendicitis type    ED Discharge Orders    None       Virgel Manifold, MD 06/08/18 [phone]

## 2018-06-08 NOTE — Anesthesia Procedure Notes (Signed)
Procedure Name: Intubation Date/Time: 06/08/2018 8:55 PM Performed by: Claudia Desanctis, CRNA Pre-anesthesia Checklist: Patient identified, Emergency Drugs available, Suction available and Patient being monitored Patient Re-evaluated:Patient Re-evaluated prior to induction Oxygen Delivery Method: Circle system utilized Preoxygenation: Pre-oxygenation with 100% oxygen Induction Type: IV induction, Rapid sequence and Cricoid Pressure applied Laryngoscope Size: 2 and Miller Grade View: Grade I Tube type: Oral Tube size: 7.0 mm Number of attempts: 1 Airway Equipment and Method: Stylet Placement Confirmation: ETT inserted through vocal cords under direct vision,  positive ETCO2 and breath sounds checked- equal and bilateral Secured at: 6 cm Tube secured with: Tape Dental Injury: Teeth and Oropharynx as per pre-operative assessment

## 2018-06-08 NOTE — ED Notes (Signed)
ED TO INPATIENT HANDOFF REPORT  Name/Age/Gender Jamie [name] 72 y.o. female  Code Status    Code Status Orders  (From admission, onward)         Start     Ordered   06/08/18 1706  Full code  Continuous     06/08/18 1711        Code Status History    Date Active Date Inactive Code Status Order ID Comments User Context   11/06/2017 2139 11/07/2017 1956 Full Code [id number]  Karmen Bongo, MD Inpatient   11/21/2016 0001 11/23/2016 1455 Full Code [id number]  Karmen Bongo, MD Inpatient      Home/SNF/Other Home  Chief Complaint Lower Abd Pain  Level of Care/Admitting Diagnosis ED Disposition    ED Disposition Condition [facility] Area: [facility] [[id number]]  Level of Care: Med-Surg [16]  Diagnosis: Acute appendicitis [803212]  Admitting Physician: CCS, [place]  Attending Physician: CCS, MD [[id number]]  PT Class (Do Not Modify): Observation [104]  PT Acc Code (Do Not Modify): Observation [10022]       Medical History Past Medical History:  Diagnosis Date  . Allergic rhinitis   . Anxiety   . Arthritis    left foot  . Asthma   . Back pain   . Colon polyps    adenomatous  . Constipation   . Diverticulosis   . Dysrhythmia 1999   Dr Johnsie Cancel 2003  . Elevated LFTs   . Fatty liver disease, nonalcoholic   . GERD (gastroesophageal reflux disease)   . Headache(784.0)    migraines with vision changes but no pain  . Heart murmur   . Hypertension   . IBS (irritable bowel syndrome)   . Joint pain   . Migraines   . Muscle cramps   . Obesity   . Palpitations   . Pneumonia 2007  . Renal cell carcinoma ([place]) 2013   minimal partial nephrectomy  . Restless leg syndrome   . Sleep apnea    moderate; wears CPAP, uncertain about setting  . TIA (transient ischemic attack) 09/11/2014  . Ulcer   . Urinary incontinence   . Vitamin D deficiency     Allergies Allergies  Allergen Reactions  . Buprenorphine Hcl Rash and Shortness Of  Breath  . Morphine And Related Shortness Of Breath and Rash  . Morphine And Related Shortness Of Breath  . Aspirin Other (See Comments)    REACTION: ulcers  Can take 81 mg  . Avelox [Moxifloxacin Hcl In Nacl] Hives  . Diflucan [Fluconazole] Hives    Whelps and rash c skin peeling  . Diflucan [Fluconazole] Hives  . Latex Rash  . Moxifloxacin Palpitations    Tachycardia     IV Location/Drains/Wounds Patient Lines/Drains/Airways Status   Active Line/Drains/Airways    Name:   Placement date:   Placement time:   Site:   Days:   Peripheral IV 06/08/18 Left Forearm   06/08/18    1513    Forearm   less than 1          Labs/Imaging Results for orders placed or performed during the hospital encounter of 06/08/18 (from the past 48 hour(s))  Lipase, blood     Status: None   Collection Time: 06/08/18 11:38 AM  Result Value Ref Range   Lipase 32 11 - 51 U/L    Comment: Performed at [facility], [place] [address], [place], [place] [address]  Comprehensive metabolic panel  Status: Abnormal   Collection Time: 06/08/18 11:38 AM  Result Value Ref Range   Sodium 141 135 - 145 mmol/L   Potassium 4.6 3.5 - 5.1 mmol/L   Chloride 106 98 - 111 mmol/L   CO2 27 22 - 32 mmol/L   Glucose, Bld 105 (H) 70 - 99 mg/dL   BUN 17 8 - 23 mg/dL   Creatinine, Ser 0.69 0.44 - 1.00 mg/dL   Calcium 10.1 8.9 - 10.3 mg/dL   Total Protein 6.9 6.5 - 8.1 g/dL   Albumin 4.2 3.5 - 5.0 g/dL   AST 17 15 - 41 U/L   ALT 20 0 - 44 U/L   Alkaline Phosphatase 52 38 - 126 U/L   Total Bilirubin 0.7 0.3 - 1.2 mg/dL   GFR calc non Af Amer >60 >60 mL/min   GFR calc Af Amer >60 >60 mL/min   Anion gap 8 5 - 15    Comment: Performed at [facility], [place] [address] [place], [place] [address]  CBC     Status: None   Collection Time: 06/08/18 11:38 AM  Result Value Ref Range   WBC 5.6 4.0 - 10.5 K/uL   RBC 4.24 3.87 - 5.11 MIL/uL   Hemoglobin 13.0 12.0 - 15.0 g/dL   HCT 41.1 36.0 -  46.0 %   MCV 96.9 80.0 - 100.0 fL   MCH 30.7 26.0 - 34.0 pg   MCHC 31.6 30.0 - 36.0 g/dL   RDW 13.5 11.5 - 15.5 %   Platelets 236 150 - 400 K/uL   nRBC 0.0 0.0 - 0.2 %    Comment: Performed at [facility], [place] [address] [place], [name] [address]  Urinalysis, Routine w reflex microscopic     Status: None   Collection Time: 06/08/18 11:40 AM  Result Value Ref Range   Color, Urine YELLOW YELLOW   APPearance CLEAR CLEAR   Specific Gravity, Urine 1.010 1.005 - 1.030   pH 6.0 5.0 - 8.0   Glucose, UA NEGATIVE NEGATIVE mg/dL   Hgb urine dipstick NEGATIVE NEGATIVE   Bilirubin Urine NEGATIVE NEGATIVE   Ketones, ur NEGATIVE NEGATIVE mg/dL   Protein, ur NEGATIVE NEGATIVE mg/dL   Nitrite NEGATIVE NEGATIVE   Leukocytes, UA NEGATIVE NEGATIVE    Comment: Performed at [facility] [address] [place], [place] [address]   Ct Abdomen Pelvis W Contrast  Result Date: 06/08/2018 CLINICAL DATA:  Abdominal pain for 3 days. Prior history of partial right nephrectomy for renal cell carcinoma. EXAM: CT ABDOMEN AND PELVIS WITH CONTRAST TECHNIQUE: Multidetector CT imaging of the abdomen and pelvis was performed using the standard protocol following bolus administration of intravenous contrast. CONTRAST:  155m ISOVUE-300 IOPAMIDOL (ISOVUE-300) INJECTION 61% COMPARISON:  CT scan 11/20/2016 FINDINGS: Lower chest: The lung bases are clear except for dependent subpleural atelectasis. No worrisome pulmonary lesions. The heart is normal in size. No pericardial effusion. Hepatobiliary: No focal hepatic lesions or intrahepatic biliary dilatation. The gallbladder is normal. No common bile duct dilatation. Pancreas: No mass, inflammation or ductal dilatation. Spleen: Normal size.  No focal lesions. Adrenals/Urinary Tract: The adrenal glands and kidneys are unremarkable. There are surgical changes involving the right kidney from partial nephrectomy. No findings for recurrent tumor.  No hydronephrosis. Stomach/Bowel: The stomach, duodenum, small bowel and colon are unremarkable. No acute inflammatory changes, mass lesions or obstructive findings. Sigmoid colon diverticulosis without findings for acute diverticulitis. The appendix is distended and fluid-filled and demonstrates mucosal enhancement.  There is also mild periappendiceal inflammatory changes and a small appendicoliths. Findings consistent with acute appendicitis. Vascular/Lymphatic: The aorta is normal in caliber. No dissection. The branch vessels are patent. The major venous structures are patent. No mesenteric or retroperitoneal mass or adenopathy. Small scattered lymph nodes are noted. Reproductive: The uterus is surgically absent. Both ovaries are still present and appear normal. Other: No pelvic mass or adenopathy. No free pelvic fluid collections. No inguinal mass or adenopathy. No abdominal wall hernia or subcutaneous lesions. Musculoskeletal: No significant bony findings. IMPRESSION: 1. CT findings consistent with acute appendicitis. No findings for rupture. Small appendicoliths noted. 2. No other significant abdominal/pelvic findings. Electronically Signed   By: Marijo Sanes M.D.   On: 06/08/2018 15:57   None  Pending Labs Unresulted Labs (From admission, onward)    Start     Ordered   06/09/18 0500  CBC  Tomorrow morning,   R     06/08/18 1711   06/09/18 7622  Basic metabolic panel  Tomorrow morning,   R     06/08/18 1711   06/08/18 1706  HIV antibody (Routine Testing)  Once,   R     06/08/18 1711          Vitals/Pain Today's Vitals   06/08/18 1508 06/08/18 1630 06/08/18 1700 06/08/18 1800  BP: 114/73 (!) 128/93 135/73 115/64  Pulse: 78  82   Resp: 18  18   Temp:      TempSrc:      SpO2: 99%  99%   Weight:      Height:      PainSc: 8        Isolation Precautions No active isolations  Medications Medications  iopamidol (ISOVUE-300) 61 % injection (has no administration in time range)   sodium chloride (PF) 0.9 % injection (has no administration in time range)  lactated ringers 1,000 mL with potassium chloride 20 mEq infusion ( Intravenous New Bag/Given 06/08/18 1715)  HYDROmorphone (DILAUDID) injection 0.5-1 mg (has no administration in time range)  diphenhydrAMINE (BENADRYL) 12.5 MG/5ML elixir 12.5 mg (has no administration in time range)    Or  diphenhydrAMINE (BENADRYL) injection 12.5 mg (has no administration in time range)  ondansetron (ZOFRAN-ODT) disintegrating tablet 4 mg (has no administration in time range)    Or  ondansetron (ZOFRAN) injection 4 mg (has no administration in time range)  ketorolac (TORADOL) 30 MG/ML injection 15 mg (15 mg Intravenous Given 06/08/18 1800)  pantoprazole (PROTONIX) injection 40 mg (has no administration in time range)  clonazePAM (KLONOPIN) tablet 0.5 mg (has no administration in time range)  spironolactone (ALDACTONE) tablet 50 mg (has no administration in time range)  clidinium-chlordiazePOXIDE (LIBRAX) 2.5-5 mg per capsule (has no administration in time range)  piperacillin-tazobactam (ZOSYN) IVPB 3.375 g (has no administration in time range)  ketorolac (TORADOL) 15 MG/ML injection 15 mg (15 mg Intravenous Given 06/08/18 1513)  iopamidol (ISOVUE-300) 61 % injection 100 mL (100 mLs Intravenous Contrast Given 06/08/18 1537)  piperacillin-tazobactam (ZOSYN) IVPB 3.375 g (0 g Intravenous Stopped 06/08/18 1732)    Mobility walks with person assist

## 2018-06-08 NOTE — H&P (Signed)
Jamie [name] is an 72 y.o. female.   Chief Complaint: abdominal pain  HPI: Patient is a 72 year old female who presented to the ED today with abdominal pain, nausea and a history of diverticulitis.  She says that the symptoms started Saturday, 06/06/2018, with pain under her left ribs.  She got better but she was able to eat later and slept throughout the evening without difficulty.  On 12/1, she felt gassy and a bit bloated but no real pain.  She could not sleep because she was uncomfortable.  Today the pain is in the right lower quadrant and mid lower abdomen.  She has had some nausea but no vomiting.  She has IBS with chronic constipation and is on treatment for this.   She was most recently hospitalized 5/2 -11/07/2017, with hyponatremia, fatigue with nausea, essential hypertension with excellent episodes of hypotension and morbid obesity.  She has a history of TIA, obstructive sleep apnea on CPAP, history of renal cell carcinoma, hypertension, GERD, and NASH.  She reports she has been on a diet and has lost 70 pounds since last year.  Work-up in the emergency department shows temperature is 99.3, initial blood pressure was elevated but current blood pressures are normal.  Sats are 97 to 99% on room air. BMI is listed is 29.06.  Labs show a normal CMP except for a glucose of 105, creatinine 0.69 LFTs are normal, lipase is 32.  WBC is 5.6, hemoglobin 13, hematocrit 41, platelets 236,000.  Urinalysis is normal.  CT scan shows the appendix is distended and fluid-filled and demonstrates mucosal enhancement.  There is also mild periappendiceal inflammatory changes and small appendicoliths.  Findings were consistent with acute appendicitis.  No other significant abdominal or pelvic findings present.   Past Medical History:  Diagnosis Date  . Allergic rhinitis   . Anxiety   . Arthritis    left foot  . Asthma   . Back pain   . Colon polyps    adenomatous  . Constipation   . Diverticulosis   .  Dysrhythmia 1999   Dr Johnsie Cancel 2003  . Elevated LFTs   . Fatty liver disease, nonalcoholic   . GERD (gastroesophageal reflux disease)   . Headache(784.0)    migraines with vision changes but no pain  . Heart murmur   . Hypertension   . IBS (irritable bowel syndrome)   . Joint pain   . Migraines   . Muscle cramps   . Obesity   . Palpitations   . Pneumonia 2007  . Renal cell carcinoma ([place]) 2013   minimal partial nephrectomy  . Restless leg syndrome   . Sleep apnea    moderate; wears CPAP, uncertain about setting  . TIA (transient ischemic attack) 09/11/2014  . Ulcer   . Urinary incontinence   . Vitamin D deficiency     Past Surgical History:  Procedure Laterality Date  . BREAST REDUCTION SURGERY  Jan 1991  . ELBOW SURGERY Left   . KIDNEY SURGERY Right October 2013    Tumor removed   . TONSILLECTOMY    . VAGINAL HYSTERECTOMY  1979   partial    Family History  Problem Relation Age of Onset  . Heart disease Father 51  . Lung cancer Father        smoker  . Hypertension Mother   . Anxiety disorder Mother   . Heart disease Brother   . Hypertension Other   . Colon cancer Cousin   . Allergies Daughter   .  Allergies Brother   . Melanoma Daughter   . Lung cancer Sister   . Liver disease Neg Hx   . Asthma Neg Hx   . Esophageal cancer Neg Hx   . Rectal cancer Neg Hx   . Stomach cancer Neg Hx    Social History:  reports that she has never smoked. She has never used smokeless tobacco. She reports that she drinks about 2.0 - 3.0 standard drinks of alcohol per week. She reports that she does not use drugs.  Allergies:  Allergies  Allergen Reactions  . Buprenorphine Hcl Rash and Shortness Of Breath  . Morphine And Related Shortness Of Breath and Rash  . Morphine And Related Shortness Of Breath  . Aspirin Other (See Comments)    REACTION: ulcers  Can take 81 mg  . Avelox [Moxifloxacin Hcl In Nacl] Hives  . Diflucan [Fluconazole] Hives    Whelps and rash c skin peeling   . Diflucan [Fluconazole] Hives  . Latex Rash  . Moxifloxacin Palpitations    Tachycardia     Prior to Admission medications   Medication Sig Start Date End Date Taking? Authorizing Provider  Ascorbic Acid (VITAMIN C) 1000 MG tablet Take 1,000 mg by mouth every evening.   Yes [provider]  aspirin EC 81 MG tablet Take 1 tablet (81 mg total) by mouth daily. 09/16/14  Yes Dohmeier, Asencion Partridge, MD  BIOTIN PO Take 1 capsule by mouth every evening.    Yes [provider]  Cholecalciferol (VITAMIN D3) 2000 units TABS Take 2,000 Units by mouth every evening.    Yes [provider]  clidinium-chlordiazePOXIDE (LIBRAX) 5-2.5 MG capsule Take 1 capsule by mouth 2 (two) times daily. 09/22/17  Yes Irene Shipper, MD  clonazePAM (KLONOPIN) 1 MG tablet Take 0.5 mg by mouth daily as needed (restless leg syndrome).  03/12/18  Yes [provider]  conjugated estrogens (PREMARIN) vaginal cream Place 1 Applicatorful vaginally every other day.    Yes [provider]  diphenhydrAMINE (BENADRYL) 25 MG tablet Take 25 mg by mouth daily as needed for allergies.    Yes [provider]  esomeprazole (NEXIUM) 40 MG capsule TAKE 1 CAPSULE TWICE A DAY BEFORE MEALS Patient taking differently: Take 40 mg by mouth 2 (two) times daily before a meal.  09/24/17  Yes Irene Shipper, MD  linaclotide [facility]) 145 MCG CAPS capsule Take 1 capsule (145 mcg total) by mouth daily. Patient taking differently: Take 145 mcg by mouth daily as needed (constipation).  10/30/16  Yes Irene Shipper, MD  metaxalone (SKELAXIN) 800 MG tablet Take 400 mg by mouth daily as needed for muscle spasms.   Yes [provider]  metFORMIN (GLUCOPHAGE) 500 MG tablet Take 1 tablet (500 mg total) by mouth daily with breakfast. 06/01/18  Yes [name], PA-C  Omega-3-6-9 CAPS Take 1 capsule by mouth every evening.   Yes [provider]  Prenatal Vit-Fe Fumarate-FA (PRENATAL MULTIVITAMIN) TABS  tablet Take 1 tablet by mouth every evening.    Yes [provider]  Probiotic Product (PROBIOTIC-10 PO) Take 1 capsule by mouth every evening.    Yes [provider]  spironolactone (ALDACTONE) 50 MG tablet Take 50 mg by mouth daily.  12/01/17  Yes [provider]     Results for orders placed or performed during the hospital encounter of 06/08/18 (from the past 48 hour(s))  Lipase, blood     Status: None   Collection Time: 06/08/18 11:38 AM  Result Value Ref Range   Lipase 32 11 - 51 U/L    Comment: Performed at [facility], [place] [address], [place], [place] [address]  Comprehensive metabolic panel     Status: Abnormal   Collection Time: 06/08/18 11:38 AM  Result Value Ref Range   Sodium 141 135 - 145 mmol/L   Potassium 4.6 3.5 - 5.1 mmol/L   Chloride 106 98 - 111 mmol/L   CO2 27 22 - 32 mmol/L   Glucose, Bld 105 (H) 70 - 99 mg/dL   BUN 17 8 - 23 mg/dL   Creatinine, Ser 0.69 0.44 - 1.00 mg/dL   Calcium 10.1 8.9 - 10.3 mg/dL   Total Protein 6.9 6.5 - 8.1 g/dL   Albumin 4.2 3.5 - 5.0 g/dL   AST 17 15 - 41 U/L   ALT 20 0 - 44 U/L   Alkaline Phosphatase 52 38 - 126 U/L   Total Bilirubin 0.7 0.3 - 1.2 mg/dL   GFR calc non Af Amer >60 >60 mL/min   GFR calc Af Amer >60 >60 mL/min   Anion gap 8 5 - 15    Comment: Performed at [facility], [place] [address] [place], [place] [address]  CBC     Status: None   Collection Time: 06/08/18 11:38 AM  Result Value Ref Range   WBC 5.6 4.0 - 10.5 K/uL   RBC 4.24 3.87 - 5.11 MIL/uL   Hemoglobin 13.0 12.0 - 15.0 g/dL   HCT 41.1 36.0 - 46.0 %   MCV 96.9 80.0 - 100.0 fL   MCH 30.7 26.0 - 34.0 pg   MCHC 31.6 30.0 - 36.0 g/dL   RDW 13.5 11.5 - 15.5 %   Platelets 236 150 - 400 K/uL   nRBC 0.0 0.0 - 0.2 %    Comment: Performed at [facility], [place] [address] [place], [place] [address]  Urinalysis, Routine w reflex microscopic     Status: None   Collection Time:  06/08/18 11:40 AM  Result Value Ref Range   Color, Urine YELLOW YELLOW   APPearance CLEAR CLEAR   Specific Gravity, Urine 1.010 1.005 - 1.030   pH 6.0 5.0 - 8.0   Glucose, UA NEGATIVE NEGATIVE mg/dL   Hgb urine dipstick NEGATIVE NEGATIVE   Bilirubin Urine NEGATIVE NEGATIVE   Ketones, ur NEGATIVE NEGATIVE mg/dL   Protein, ur NEGATIVE NEGATIVE mg/dL   Nitrite NEGATIVE NEGATIVE   Leukocytes, UA NEGATIVE NEGATIVE    Comment: Performed at [place] [address] [place], [place] [address]   Ct Abdomen Pelvis W Contrast  Result Date: 06/08/2018 CLINICAL DATA:  Abdominal pain for 3 days. Prior history of partial right nephrectomy for renal cell carcinoma. EXAM: CT ABDOMEN AND PELVIS WITH CONTRAST TECHNIQUE: Multidetector CT imaging of the abdomen and pelvis was performed using the standard protocol following bolus administration of intravenous contrast. CONTRAST:  160m ISOVUE-300 IOPAMIDOL (ISOVUE-300) INJECTION 61% COMPARISON:  CT scan 11/20/2016 FINDINGS: Lower chest: The lung bases are clear except for dependent subpleural atelectasis. No worrisome pulmonary lesions. The heart is normal in size. No pericardial effusion. Hepatobiliary: No focal hepatic lesions or intrahepatic biliary dilatation. The gallbladder is normal. No common bile duct dilatation. Pancreas: No mass, inflammation or ductal dilatation. Spleen: Normal size.  No focal lesions. Adrenals/Urinary Tract: The adrenal glands and kidneys are unremarkable. There are surgical changes involving the right kidney from partial nephrectomy. No findings for recurrent tumor. No hydronephrosis. Stomach/Bowel:  The stomach, duodenum, small bowel and colon are unremarkable. No acute inflammatory changes, mass lesions or obstructive findings. Sigmoid colon diverticulosis without findings for acute diverticulitis. The appendix is distended and fluid-filled and demonstrates mucosal enhancement. There is also mild periappendiceal  inflammatory changes and a small appendicoliths. Findings consistent with acute appendicitis. Vascular/Lymphatic: The aorta is normal in caliber. No dissection. The branch vessels are patent. The major venous structures are patent. No mesenteric or retroperitoneal mass or adenopathy. Small scattered lymph nodes are noted. Reproductive: The uterus is surgically absent. Both ovaries are still present and appear normal. Other: No pelvic mass or adenopathy. No free pelvic fluid collections. No inguinal mass or adenopathy. No abdominal wall hernia or subcutaneous lesions. Musculoskeletal: No significant bony findings. IMPRESSION: 1. CT findings consistent with acute appendicitis. No findings for rupture. Small appendicoliths noted. 2. No other significant abdominal/pelvic findings. Electronically Signed   By: Marijo Sanes M.D.   On: 06/08/2018 15:57    Review of Systems  Constitutional: Positive for weight loss (70 pounds over the last year on a weight loss program.).  HENT: Negative.   Eyes: Negative.   Respiratory: Negative.   Cardiovascular: Negative.   Gastrointestinal: Positive for abdominal pain (Left upper quadrant on 11/30, bloating and distention 12/1, pain in the mid right lower and mid lower abdomen 12/2), constipation (Chronic with IBS, on Linzess), heartburn (On Nexium twice daily) and nausea. Negative for blood in stool, diarrhea, melena and vomiting.  Genitourinary: Negative.   Musculoskeletal: Negative.   Skin: Negative.   Neurological: Negative.   Endo/Heme/Allergies: Negative.   Psychiatric/Behavioral: Negative.     Blood pressure 114/73, pulse 78, temperature 99.3 F (37.4 C), temperature source Oral, resp. rate 18, height 5' 3"  (1.6 m), weight 74.4 kg, SpO2 99 %. Physical Exam  Constitutional: She is oriented to person, place, and time. She appears well-developed and well-nourished. No distress.  Very anxious about pain, and pain control.  HENT:  Head: Normocephalic and  atraumatic.  Mouth/Throat: Oropharynx is clear and moist. No oropharyngeal exudate.  Eyes: Right eye exhibits no discharge. Left eye exhibits no discharge. No scleral icterus.  Pupils are equal  Neck: Normal range of motion. Neck supple. No JVD present. No tracheal deviation present. No thyromegaly present.  Cardiovascular: Normal rate, regular rhythm, normal heart sounds and intact distal pulses.  No murmur heard. Respiratory: Effort normal and breath sounds normal. No respiratory distress. She has no wheezes. She has no rales. She exhibits no tenderness.  GI: Soft. Bowel sounds are normal. She exhibits no distension and no mass. There is tenderness (Right lower quadrant and mid lower abdomen.). There is no rebound and no guarding.  She has a bucket handle incision and laparoscopy incisions that are well-healed  Musculoskeletal: She exhibits no edema or tenderness.  Lymphadenopathy:    She has no cervical adenopathy.  Neurological: She is alert and oriented to person, place, and time. No cranial nerve deficit.  Skin: Skin is warm. No rash noted. She is not diaphoretic. No erythema. No pallor.  Psychiatric: She has a normal mood and affect. Her behavior is normal. Judgment and thought content normal.     Assessment/Plan Acute appendicitis Hx of renal cell carcinoma with partial right nephrectomy 04/2012 Hx of morbid obesity; weight loss 70 pounds over the last 12 months on diet Obstructive sleep apnea on CPAP Restless leg syndrome History of hypertension Hx of IBS with chronic constipation Hx heart murmur, grade 1 diastolic dysfunction, bilateral atrial enlargement, negative bubble study 10/03/15  Hx of nonalcoholic fatty liver disease Hx headaches/migraines Hx TIA   Plan: Admit, she was started on IV Zosyn in the ED so we will continue that.  N.p.o./IV fluids, surgery later this evening by Dr. Marlou Starks.     Sanye Ledesma, PA-C 06/08/2018, 4:19 PM

## 2018-06-09 ENCOUNTER — Other Ambulatory Visit: Payer: Self-pay

## 2018-06-09 DIAGNOSIS — K3580 Unspecified acute appendicitis: Secondary | ICD-10-CM | POA: Diagnosis not present

## 2018-06-09 LAB — BASIC METABOLIC PANEL
Anion gap: 9 (ref 5–15)
BUN: 20 mg/dL (ref 8–23)
CALCIUM: 9.4 mg/dL (ref 8.9–10.3)
CO2: 24 mmol/L (ref 22–32)
Chloride: 107 mmol/L (ref 98–111)
Creatinine, Ser: 0.82 mg/dL (ref 0.44–1.00)
GFR calc Af Amer: >60 mL/min (ref >60)
GFR calc non Af Amer: >60 mL/min (ref >60)
Glucose, Bld: 135 mg/dL — ABNORMAL HIGH (ref 70–99)
Potassium: 4.5 mmol/L (ref 3.5–5.1)
Sodium: 140 mmol/L (ref 135–145)

## 2018-06-09 LAB — CBC
HEMATOCRIT: 36.6 % (ref 36.0–46.0)
Hemoglobin: 11.6 g/dL — ABNORMAL LOW (ref 12.0–15.0)
MCH: 31.3 pg (ref 26.0–34.0)
MCHC: 31.7 g/dL (ref 30.0–36.0)
MCV: 98.7 fL (ref 80.0–100.0)
Platelets: 193 10*3/uL (ref 150–400)
RBC: 3.71 MIL/uL — ABNORMAL LOW (ref 3.87–5.11)
RDW: 13.3 % (ref 11.5–15.5)
WBC: 8.1 10*3/uL (ref 4.0–10.5)
nRBC: 0 % (ref 0.0–0.2)

## 2018-06-09 LAB — HIV ANTIBODY (ROUTINE TESTING W REFLEX): HIV Screen 4th Generation wRfx: NONREACTIVE

## 2018-06-09 MED ORDER — OXYCODONE HCL 5 MG PO TABS: 5.0000 mg | ORAL_TABLET | ORAL | 0 refills | Status: DC | PRN

## 2018-06-09 MED ORDER — OXYCODONE HCL 5 MG PO TABS: 5.0000 mg | ORAL_TABLET | ORAL | Status: DC | PRN

## 2018-06-09 NOTE — Discharge Summary (Signed)
Patient ID: Jamie [name] [id number] 12-Jul-1945 72 y.o.  Admit date: 06/08/2018 Discharge date: 06/09/2018  Admitting Diagnosis: Acute appendicitis NASH  Discharge Diagnosis Patient Active Problem List   Diagnosis Date Noted  . Acute appendicitis 06/08/2018  . Hyponatremia 11/06/2017  . Other fatigue 08/25/2017  . Shortness of breath on exertion 08/25/2017  . Vitamin D deficiency 08/25/2017  . Hyperglycemia 08/25/2017  . 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 TIA (transient ischemic attack) 12/19/2016  . Common migraine 12/19/2016  . Obstructive sleep apnea on CPAP 12/19/2016  . Acute urinary retention 11/21/2016  . 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 Diverticulitis 11/20/2016  . Pain in left ankle and joints of left foot 08/13/2016  . Pain and swelling of left ankle 08/13/2016  . At risk for polypharmacy 10/02/2015  . Intractable migraine with aura with status migrainosus 10/02/2015  . Other specified transient cerebral ischemias 09/16/2014  . 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 Morbid obesity ([place]) 09/16/2014  . Headache 05/03/2013  . Preop exam for internal medicine 03/10/2012  . Kidney mass 03/10/2012  . UTI (lower urinary tract infection) 02/13/2012  . Excessive tearing 12/16/2011  . Leg pain 09/20/2011  . Acute bronchitis 06/27/2011  . Wheezing 06/27/2011  . Rosacea 05/07/2011  . Shoulder pain, right 05/07/2011  . OSA (obstructive sleep apnea) 01/31/2011  . Confusion state 11/26/2010  . ABNORMAL ELECTROCARDIOGRAM 08/28/2010  . Tinnitus 08/09/2010  . SINUSITIS, CHRONIC 08/09/2010  . Dizziness and giddiness 01/18/2010  . HYPERGLYCEMIA 08/23/2009  . LIVER FUNCTION TESTS, ABNORMAL, HX OF 08/23/2009  . Fatigue 01/16/2009  . PERSONAL HX COLONIC POLYPS 09/21/2008  . ADENOMATOUS COLONIC POLYP 09/20/2008  . ALLERGIC RHINITIS 04/28/2008  . RESTRICTIVE LUNG DISEASE 04/28/2008  .  CRAMPS,LEG 09/28/2007  . OBESITY 04/01/2007  . VOCAL CORD DISORDER 04/01/2007  . REDUCTION MAMMOPLASTY, HX OF 04/01/2007  . RESTLESS LEG SYNDROME 02/02/2007  . Essential hypertension 02/02/2007  . MITRAL VALVE PROLAPSE 02/02/2007  . GERD 02/02/2007  . Irritable bowel syndrome 02/02/2007  . BREAST CYST 02/02/2007    Consultants none  Reason for Admission: Patient is a 71 year old female who presented to the ED today with abdominal pain, nausea and a history of diverticulitis.  She says that the symptoms started Saturday, 06/06/2018, with pain under her left ribs.  She got better but she was able to eat later and slept throughout the evening without difficulty.  On 12/1, she felt gassy and a bit bloated but no real pain.  She could not sleep because she was uncomfortable.  Today the pain is in the right lower quadrant and mid lower abdomen.  She has had some nausea but no vomiting.  She has IBS with chronic constipation and is on treatment for this.   She was most recently hospitalized 5/2 -11/07/2017, with hyponatremia, fatigue with nausea, essential hypertension with excellent episodes of hypotension and morbid obesity.  She has a history of TIA, obstructive sleep apnea on CPAP, history of renal cell carcinoma, hypertension, GERD, and NASH.  She reports she has been on a diet and has lost 70 pounds since last year.  Work-up in the emergency department shows temperature is 99.3, initial blood pressure was elevated but current blood pressures are normal.  Sats are 97 to 99% on room air. BMI is listed is 29.06.  Labs show a normal CMP except for a glucose of 105, creatinine 0.69 LFTs are normal, lipase is 32.  WBC is 5.6, hemoglobin  13, hematocrit 41, platelets 236,000.  Urinalysis is normal.  CT scan shows the appendix is distended and fluid-filled and demonstrates mucosal enhancement.  There is also mild periappendiceal inflammatory changes and small appendicoliths.  Findings were consistent with  acute appendicitis.  No other significant abdominal or pelvic findings present.  Procedures Lap appy, Dr. Marlou Starks 06-08-18  Hospital Course:  The patient was admitted and underwent a laparoscopic appendectomy.  The patient tolerated the procedure well.  On POD 1, the patient was tolerating a regular diet, voiding well, mobilizing, and pain was controlled with oral pain medications.  The patient was stable for DC home at this time with appropriate follow up made.     Physical Exam: Abd: soft, appropriately tender, +BS, ND, incisions c/d/i with dermanbond  Allergies as of 06/09/2018      Reactions   Buprenorphine Hcl Rash, Shortness Of Breath   Morphine And Related Shortness Of Breath, Rash   Morphine And Related Shortness Of Breath   Aspirin Other (See Comments)   REACTION: ulcers Can take 81 mg   Avelox [moxifloxacin Hcl In Nacl] Hives   Diflucan [fluconazole] Hives   Whelps and rash c skin peeling   Diflucan [fluconazole] Hives   Latex Rash   Moxifloxacin Palpitations   Tachycardia      Medication List    TAKE these medications   aspirin EC 81 MG tablet Take 1 tablet (81 mg total) by mouth daily.   BIOTIN PO Take 1 capsule by mouth every evening.   clidinium-chlordiazePOXIDE 5-2.5 MG capsule Commonly known as:  LIBRAX Take 1 capsule by mouth 2 (two) times daily.   clonazePAM 1 MG tablet Commonly known as:  KLONOPIN Take 0.5 mg by mouth daily as needed (restless leg syndrome).   conjugated estrogens vaginal cream Commonly known as:  PREMARIN Place 1 Applicatorful vaginally every other day.   diphenhydrAMINE 25 MG tablet Commonly known as:  BENADRYL Take 25 mg by mouth daily as needed for allergies.   esomeprazole 40 MG capsule Commonly known as:  NEXIUM TAKE 1 CAPSULE TWICE A DAY BEFORE MEALS What changed:  See the new instructions.   linaclotide 145 MCG Caps capsule Commonly known as:  LINZESS Take 1 capsule (145 mcg total) by mouth daily. What changed:     when to take this  reasons to take this   metaxalone 800 MG tablet Commonly known as:  SKELAXIN Take 400 mg by mouth daily as needed for muscle spasms.   metFORMIN 500 MG tablet Commonly known as:  GLUCOPHAGE Take 1 tablet (500 mg total) by mouth daily with breakfast.   Omega-3-6-9 Caps Take 1 capsule by mouth every evening.   oxyCODONE 5 MG immediate release tablet Commonly known as:  Oxy IR/ROXICODONE Take 1 tablet (5 mg total) by mouth every 4 (four) hours as needed for moderate pain.   prenatal multivitamin Tabs tablet Take 1 tablet by mouth every evening.   PROBIOTIC-10 PO Take 1 capsule by mouth every evening.   spironolactone 50 MG tablet Commonly known as:  ALDACTONE Take 50 mg by mouth daily.   vitamin C 1000 MG tablet Take 1,000 mg by mouth every evening.   Vitamin D3 50 MCG (2000 UT) Tabs Take 2,000 Units by mouth every evening.        Follow-up Information    [facility], [place] Follow up on 07/02/2018.   Specialty:  General Surgery Why:  8:30 am, arrive by 8:00am for paperwork and check in Contact information: [address] [place] [address] [phone]           Signed: Saverio Danker, [facility] 06/09/2018, 12:30 PM Pager: [phone]

## 2018-06-09 NOTE — Progress Notes (Signed)
Patient has home CPAP. Patient wants to wear oxygen instead of her CPAP tonight. Encouraged patient to call if she changes her mind.

## 2018-06-09 NOTE — Progress Notes (Signed)
Pt was discharged home today. Instructions were reviewed with patient, and questions were answered. Pt was taken to main entrance via wheelchair by NT.  

## 2018-06-09 NOTE — Discharge Instructions (Signed)
[facility]  Please arrive at least 30 min before your appointment to complete your check in paperwork.  If you are unable to arrive 30 min prior to your appointment time we may have to cancel or reschedule you. LAPAROSCOPIC SURGERY: POST OP INSTRUCTIONS Always review your discharge instruction sheet given to you by the facility where your surgery was performed. IF YOU HAVE DISABILITY OR FAMILY LEAVE FORMS, YOU MUST BRING THEM TO THE OFFICE FOR PROCESSING.   DO NOT GIVE THEM TO YOUR DOCTOR.  PAIN CONTROL  1. First take acetaminophen (Tylenol) AND/or ibuprofen (Advil) to control your pain after surgery.  Follow directions on package.  Taking acetaminophen (Tylenol) and/or ibuprofen (Advil) regularly after surgery will help to control your pain and lower the amount of prescription pain medication you may need.  You should not take more than 4,000 mg (4 grams) of acetaminophen (Tylenol) in 24 hours.  You should not take ibuprofen (Advil), aleve, motrin, naprosyn or other NSAIDS if you have a history of stomach ulcers or chronic kidney disease.  2. A prescription for pain medication may be given to you upon discharge.  Take your pain medication as prescribed, if you still have uncontrolled pain after taking acetaminophen (Tylenol) or ibuprofen (Advil). 3. Use ice packs to help control pain. 4. If you need a refill on your pain medication, please contact your pharmacy.  They will contact our office to request authorization. Prescriptions will not be filled after 5pm or on week-ends.  HOME MEDICATIONS 5. Take your usually prescribed medications unless otherwise directed.  DIET 6. You should follow a light diet the first few days after arrival home.  Be sure to include lots of fluids daily. Avoid fatty, fried foods.   CONSTIPATION 7. It is common to experience some constipation after surgery and if you are taking pain medication.  Increasing fluid intake and taking a stool  softener (such as Colace) will usually help or prevent this problem from occurring.  A mild laxative (Milk of Magnesia or Miralax) should be taken according to package instructions if there are no bowel movements after 48 hours.  WOUND/INCISION CARE 8. Most patients will experience some swelling and bruising in the area of the incisions.  Ice packs will help.  Swelling and bruising can take several days to resolve.  9. Unless discharge instructions indicate otherwise, follow guidelines below  a. STERI-STRIPS - you may remove your outer bandages 48 hours after surgery, and you may shower at that time.  You have steri-strips (small skin tapes) in place directly over the incision.  These strips should be left on the skin for 7-10 days.   b. DERMABOND/SKIN GLUE - you may shower in 24 hours.  The glue will flake off over the next 2-3 weeks. 10. Any sutures or staples will be removed at the office during your follow-up visit.  ACTIVITIES 11. You may resume regular (light) daily activities beginning the next day--such as daily self-care, walking, climbing stairs--gradually increasing activities as tolerated.  You may have sexual intercourse when it is comfortable.  Refrain from any heavy lifting or straining until approved by your doctor. a. You may drive when you are no longer taking prescription pain medication, you can comfortably wear a seatbelt, and you can safely maneuver your car and apply brakes.  FOLLOW-UP 12. You should see your doctor in the office for a follow-up appointment approximately 2-3 weeks after your surgery.  You should have been given your post-op/follow-up appointment when  your surgery was scheduled.  If you did not receive a post-op/follow-up appointment, make sure that you call for this appointment within a day or two after you arrive home to insure a convenient appointment time.   WHEN TO CALL YOUR DOCTOR: 1. Fever over 101.0 2. Inability to urinate 3. Continued bleeding from  incision. 4. Increased pain, redness, or drainage from the incision. 5. Increasing abdominal pain  The clinic staff is available to answer your questions during regular business hours.  Please dont hesitate to call and ask to speak to one of the nurses for clinical concerns.  If you have a medical emergency, go to the nearest emergency room or call 911.  A surgeon from [facility] is always on call at the hospital. [address], [place], [place], [place]  [address] ? [address], [place],    [address] [phone] ? [phone] ? FAX [phone]

## 2018-06-09 NOTE — Anesthesia Postprocedure Evaluation (Signed)
Anesthesia Post Note  Patient: Jamie [name]  Procedure(s) Performed: APPENDECTOMY LAPAROSCOPIC (N/A Abdomen)     Patient location during evaluation: PACU Anesthesia Type: General Level of consciousness: sedated Pain management: pain level controlled Vital Signs Assessment: post-procedure vital signs reviewed and stable Respiratory status: spontaneous breathing and respiratory function stable Cardiovascular status: stable Postop Assessment: no apparent nausea or vomiting Anesthetic complications: no    Last Vitals:  Vitals:   06/09/18 0205 06/09/18 0505  BP: (!) 110/58 106/67  Pulse: (!) 58 64  Resp: 18 18  Temp: 36.8 C 36.7 C  SpO2: 96% 96%    Last Pain:  Vitals:   06/09/18 0505  TempSrc: Oral  PainSc:                  Marsia Cino DANIEL

## 2018-06-12 ENCOUNTER — Encounter (HOSPITAL_COMMUNITY): Payer: Self-pay | Admitting: General Surgery

## 2018-06-25 DIAGNOSIS — Z0000 Encounter for general adult medical examination without abnormal findings: Secondary | ICD-10-CM | POA: Diagnosis not present

## 2018-06-25 DIAGNOSIS — G2581 Restless legs syndrome: Secondary | ICD-10-CM | POA: Diagnosis not present

## 2018-06-25 DIAGNOSIS — I10 Essential (primary) hypertension: Secondary | ICD-10-CM | POA: Diagnosis not present

## 2018-06-29 DIAGNOSIS — R32 Unspecified urinary incontinence: Secondary | ICD-10-CM | POA: Diagnosis not present

## 2018-06-29 DIAGNOSIS — K219 Gastro-esophageal reflux disease without esophagitis: Secondary | ICD-10-CM | POA: Diagnosis not present

## 2018-06-29 DIAGNOSIS — I10 Essential (primary) hypertension: Secondary | ICD-10-CM | POA: Diagnosis not present

## 2018-06-29 DIAGNOSIS — R7303 Prediabetes: Secondary | ICD-10-CM | POA: Diagnosis not present

## 2018-06-29 DIAGNOSIS — Z808 Family history of malignant neoplasm of other organs or systems: Secondary | ICD-10-CM | POA: Diagnosis not present

## 2018-06-29 DIAGNOSIS — I7300 Raynaud's syndrome without gangrene: Secondary | ICD-10-CM | POA: Diagnosis not present

## 2018-06-29 DIAGNOSIS — K760 Fatty (change of) liver, not elsewhere classified: Secondary | ICD-10-CM | POA: Diagnosis not present

## 2018-06-29 DIAGNOSIS — G4733 Obstructive sleep apnea (adult) (pediatric): Secondary | ICD-10-CM | POA: Diagnosis not present

## 2018-06-29 DIAGNOSIS — L719 Rosacea, unspecified: Secondary | ICD-10-CM | POA: Diagnosis not present

## 2018-06-29 DIAGNOSIS — G2581 Restless legs syndrome: Secondary | ICD-10-CM | POA: Diagnosis not present

## 2018-06-29 DIAGNOSIS — Z0000 Encounter for general adult medical examination without abnormal findings: Secondary | ICD-10-CM | POA: Diagnosis not present

## 2018-06-29 DIAGNOSIS — R51 Headache: Secondary | ICD-10-CM | POA: Diagnosis not present

## 2018-07-09 ENCOUNTER — Other Ambulatory Visit (INDEPENDENT_AMBULATORY_CARE_PROVIDER_SITE_OTHER): Payer: Self-pay | Admitting: Bariatrics

## 2018-07-09 ENCOUNTER — Other Ambulatory Visit (INDEPENDENT_AMBULATORY_CARE_PROVIDER_SITE_OTHER): Payer: Self-pay | Admitting: Physician Assistant

## 2018-07-09 DIAGNOSIS — E8881 Metabolic syndrome: Secondary | ICD-10-CM

## 2018-07-09 MED ORDER — METFORMIN HCL 500 MG PO TABS: 500.0000 mg | ORAL_TABLET | Freq: Every day | ORAL | 0 refills | Status: DC

## 2018-07-13 ENCOUNTER — Ambulatory Visit (INDEPENDENT_AMBULATORY_CARE_PROVIDER_SITE_OTHER): Payer: Medicare Other | Admitting: Physician Assistant

## 2018-07-13 DIAGNOSIS — L650 Telogen effluvium: Secondary | ICD-10-CM | POA: Diagnosis not present

## 2018-07-13 DIAGNOSIS — L821 Other seborrheic keratosis: Secondary | ICD-10-CM | POA: Diagnosis not present

## 2018-07-13 DIAGNOSIS — Z1283 Encounter for screening for malignant neoplasm of skin: Secondary | ICD-10-CM | POA: Diagnosis not present

## 2018-07-14 ENCOUNTER — Ambulatory Visit (INDEPENDENT_AMBULATORY_CARE_PROVIDER_SITE_OTHER): Payer: Medicare Other | Admitting: Physician Assistant

## 2018-07-14 ENCOUNTER — Encounter (INDEPENDENT_AMBULATORY_CARE_PROVIDER_SITE_OTHER): Payer: Self-pay | Admitting: Physician Assistant

## 2018-07-14 VITALS — BP 100/66 | HR 81 | Temp 98.5°F | Ht 63.0 in | Wt 156.0 lb

## 2018-07-14 DIAGNOSIS — Z6830 Body mass index (BMI) 30.0-30.9, adult: Secondary | ICD-10-CM

## 2018-07-14 DIAGNOSIS — E669 Obesity, unspecified: Secondary | ICD-10-CM | POA: Diagnosis not present

## 2018-07-14 DIAGNOSIS — E88819 Insulin resistance, unspecified: Secondary | ICD-10-CM

## 2018-07-14 DIAGNOSIS — E66811 Obesity, class 1: Secondary | ICD-10-CM

## 2018-07-14 DIAGNOSIS — E559 Vitamin D deficiency, unspecified: Secondary | ICD-10-CM | POA: Diagnosis not present

## 2018-07-14 DIAGNOSIS — E8881 Metabolic syndrome: Secondary | ICD-10-CM

## 2018-07-14 MED ORDER — METFORMIN HCL 500 MG PO TABS: 500.0000 mg | ORAL_TABLET | Freq: Every day | ORAL | 0 refills | Status: DC

## 2018-07-15 NOTE — Progress Notes (Signed)
Office: [phone]  /  Fax: [phone]   HPI:   Chief Complaint: OBESITY Cartina is here to discuss her progress with her obesity treatment plan. She is on the  follow the Category 2 plan and is following her eating plan approximately 70 % of the time. She states she is exercising 0 minutes 0 times per week. Neesa did well with weight loss. She stopped exercising after having an appendectomy in early December. She is ready to restart her workout routine.  Her weight is 156 lb (70.8 kg) today and has had a weight loss of 5 pounds over a period of 6 weeks since her last visit. She has lost 66 lbs since starting treatment with [name].  Insulin Resistance Gordana has a diagnosis of insulin resistance based on her elevated fasting insulin level >5. Although Matelyn's blood glucose readings are still under good control, insulin resistance puts her at greater risk of metabolic syndrome and diabetes. She is taking metformin currently, she denies nausea, vomiting and diarrhea. She denies polyphagia. Naomii continues to work on diet and exercise to decrease risk of diabetes.   Vitamin D deficiency Charise has a diagnosis of vitamin D deficiency. She is currently taking OTC Vit D and denies nausea, vomiting or muscle weakness.  ASSESSMENT AND PLAN:  Insulin resistance - Plan: metFORMIN (GLUCOPHAGE) 500 MG tablet  Vitamin D deficiency  Class 1 obesity with serious comorbidity and body mass index (BMI) of 30.0 to 30.9 in adult, unspecified obesity type - beginning BMI >30  PLAN:  Insulin Resistance Elliot will continue to work on weight loss, exercise, and decreasing simple carbohydrates in her diet to help decrease the risk of diabetes. We dicussed metformin including benefits and risks. She was informed that eating too many simple carbohydrates or too many calories at one sitting increases the likelihood of GI side effects. Delynn agrees to continue taking metformin 500 mg qd with breakfast with no refills.  A prescription was written today. Cleona agreed to follow up with our clinic in 3 weeks.  Vitamin D Deficiency Taisa was informed that low vitamin D levels contributes to fatigue and are associated with obesity, breast, and colon cancer. She agrees to continue to take OTC Vit D and will follow up for routine testing of vitamin D, at least 2-3 times per year. She was informed of the risk of over-replacement of vitamin D and agrees to not increase her dose unless she discusses this with [name] first. Coila agreed to follow up with our clinic in 3 weeks.  Obesity Shonya is currently in the action stage of change. As such, her goal is to continue with weight loss efforts She has agreed to follow the Category 2 plan Kainat has been instructed to work up to a goal of 150 minutes of combined cardio and strengthening exercise per week for weight loss and overall health benefits. We discussed the following Behavioral Modification Strategies today: work on meal planning and cooking strategies and ways to avoid boredom eating.  Lilinoe has agreed to follow up with our clinic in 3 weeks. She was informed of the importance of frequent follow up visits to maximize her success with intensive lifestyle modifications for her multiple health conditions.  ALLERGIES: Allergies  Allergen Reactions  . Buprenorphine Hcl Rash and Shortness Of Breath  . Morphine And Related Shortness Of Breath and Rash  . Morphine And Related Shortness Of Breath  . Aspirin Other (See Comments)    REACTION: ulcers  Can take 81 mg  .  Avelox [Moxifloxacin Hcl In Nacl] Hives  . Diflucan [Fluconazole] Hives    Whelps and rash c skin peeling  . Diflucan [Fluconazole] Hives  . Latex Rash  . Moxifloxacin Palpitations    Tachycardia     MEDICATIONS: Current Outpatient Medications on File Prior to Visit  Medication Sig Dispense Refill  . Ascorbic Acid (VITAMIN C) 1000 MG tablet Take 1,000 mg by mouth every evening.    Marland Kitchen aspirin EC 81 MG  tablet Take 1 tablet (81 mg total) by mouth daily. 90 tablet 3  . BIOTIN PO Take 1 capsule by mouth every evening.     . Cholecalciferol (VITAMIN D3) 2000 units TABS Take 2,000 Units by mouth every evening.     . clidinium-chlordiazePOXIDE (LIBRAX) 5-2.5 MG capsule Take 1 capsule by mouth 2 (two) times daily. 180 capsule 3  . clonazePAM (KLONOPIN) 1 MG tablet Take 0.5 mg by mouth daily as needed (restless leg syndrome).     . conjugated estrogens (PREMARIN) vaginal cream Place 1 Applicatorful vaginally every other day.     . diphenhydrAMINE (BENADRYL) 25 MG tablet Take 25 mg by mouth daily as needed for allergies.     Marland Kitchen esomeprazole (NEXIUM) 40 MG capsule TAKE 1 CAPSULE TWICE A DAY BEFORE MEALS (Patient taking differently: Take 40 mg by mouth 2 (two) times daily before a meal. ) 180 capsule 3  . linaclotide (LINZESS) 145 MCG CAPS capsule Take 1 capsule (145 mcg total) by mouth daily. (Patient taking differently: Take 145 mcg by mouth daily as needed (constipation). ) 90 capsule 3  . metaxalone (SKELAXIN) 800 MG tablet Take 400 mg by mouth daily as needed for muscle spasms.    . Omega-3-6-9 CAPS Take 1 capsule by mouth every evening.    Marland Kitchen oxyCODONE (OXY IR/ROXICODONE) 5 MG immediate release tablet Take 1 tablet (5 mg total) by mouth every 4 (four) hours as needed for moderate pain. 15 tablet 0  . Prenatal Vit-Fe Fumarate-FA (PRENATAL MULTIVITAMIN) TABS tablet Take 1 tablet by mouth every evening.     . Probiotic Product (PROBIOTIC-10 PO) Take 1 capsule by mouth every evening.     Marland Kitchen spironolactone (ALDACTONE) 50 MG tablet Take 50 mg by mouth daily.      No current facility-administered medications on file prior to visit.     PAST MEDICAL HISTORY: Past Medical History:  Diagnosis Date  . Allergic rhinitis   . Anxiety   . Arthritis    left foot  . Asthma   . Back pain   . Colon polyps    adenomatous  . Constipation   . Diverticulosis   . Dysrhythmia 1999   Dr Johnsie Cancel 2003  . Elevated  LFTs   . Fatty liver disease, nonalcoholic   . GERD (gastroesophageal reflux disease)   . Headache(784.0)    migraines with vision changes but no pain  . Heart murmur   . Hypertension   . IBS (irritable bowel syndrome)   . Joint pain   . Migraines   . Muscle cramps   . Obesity   . Palpitations   . Pneumonia 2007  . Renal cell carcinoma ([place]) 2013   minimal partial nephrectomy  . Restless leg syndrome   . Sleep apnea    moderate; wears CPAP, uncertain about setting  . TIA (transient ischemic attack) 09/11/2014  . Ulcer   . Urinary incontinence   . Vitamin D deficiency     PAST SURGICAL HISTORY: Past Surgical History:  Procedure Laterality Date  .  BREAST REDUCTION SURGERY  Jan 1991  . ELBOW SURGERY Left   . KIDNEY SURGERY Right October 2013    Tumor removed   . LAPAROSCOPIC APPENDECTOMY N/A 06/08/2018   Procedure: APPENDECTOMY LAPAROSCOPIC;  Surgeon: Jovita Kussmaul, MD;  Location: [facility];  Service: General;  Laterality: N/A;  . TONSILLECTOMY    . VAGINAL HYSTERECTOMY  1979   partial    SOCIAL HISTORY: Social History   Tobacco Use  . Smoking status: Never Smoker  . Smokeless tobacco: Never Used  Substance Use Topics  . Alcohol use: Yes    Alcohol/week: 2.0 - 3.0 standard drinks    Types: 1 - 2 Glasses of wine, 1 Shots of liquor per week  . Drug use: No    FAMILY HISTORY: Family History  Problem Relation Age of Onset  . Heart disease Father 59  . Lung cancer Father        smoker  . Hypertension Mother   . Anxiety disorder Mother   . Heart disease Brother   . Hypertension Other   . Colon cancer Cousin   . Allergies Daughter   . Allergies Brother   . Melanoma Daughter   . Lung cancer Sister   . Liver disease Neg Hx   . Asthma Neg Hx   . Esophageal cancer Neg Hx   . Rectal cancer Neg Hx   . Stomach cancer Neg Hx     ROS: Review of Systems  Constitutional: Positive for weight loss.  Gastrointestinal: Negative for diarrhea, nausea and vomiting.    Musculoskeletal:       Negative for muscle weakness  Endo/Heme/Allergies:       Negative for polyphagia    PHYSICAL EXAM: Blood pressure 100/66, pulse 81, temperature 98.5 F (36.9 C), temperature source Oral, height 5' 3"  (1.6 m), weight 156 lb (70.8 kg), SpO2 97 %. Body mass index is 27.63 kg/m. Physical Exam Vitals signs reviewed.  Constitutional:      Appearance: Normal appearance. She is obese.  Cardiovascular:     Rate and Rhythm: Normal rate.     Pulses: Normal pulses.  Pulmonary:     Effort: Pulmonary effort is normal.  Musculoskeletal: Normal range of motion.  Skin:    General: Skin is warm and dry.  Neurological:     Mental Status: She is alert and oriented to person, place, and time.  Psychiatric:        Mood and Affect: Mood normal.        Behavior: Behavior normal.     RECENT LABS AND TESTS: BMET    Component Value Date/Time   NA 140 06/09/2018 0456   NA 141 04/01/2018 1620   K 4.5 06/09/2018 0456   CL 107 06/09/2018 0456   CO2 24 06/09/2018 0456   GLUCOSE 135 (H) 06/09/2018 0456   BUN 20 06/09/2018 0456   BUN 15 04/01/2018 1620   CREATININE 0.82 06/09/2018 0456   CALCIUM 9.4 06/09/2018 0456   GFRNONAA >60 06/09/2018 0456   GFRAA >60 06/09/2018 0456   Lab Results  Component Value Date   HGBA1C 5.2 04/01/2018   HGBA1C 5.4 08/25/2017   HGBA1C 5.4 12/16/2011   HGBA1C 5.7 05/03/2011   HGBA1C 5.5 02/15/2010   Lab Results  Component Value Date   INSULIN 5.6 04/01/2018   INSULIN 50.3 (H) 08/25/2017   CBC    Component Value Date/Time   WBC 8.1 06/09/2018 0456   RBC 3.71 (L) 06/09/2018 0456   HGB 11.6 (L) 06/09/2018  0456   HGB 12.7 08/25/2017 1047   HCT 36.6 06/09/2018 0456   HCT 37.0 08/25/2017 1047   PLT 193 06/09/2018 0456   MCV 98.7 06/09/2018 0456   MCV 93 08/25/2017 1047   MCH 31.3 06/09/2018 0456   MCHC 31.7 06/09/2018 0456   RDW 13.3 06/09/2018 0456   RDW 13.2 08/25/2017 1047   LYMPHSABS 1.4 11/06/2017 1726   LYMPHSABS 1.2  08/25/2017 1047   MONOABS 0.6 11/06/2017 1726   EOSABS 0.1 11/06/2017 1726   EOSABS 0.1 08/25/2017 1047   BASOSABS 0.0 11/06/2017 1726   BASOSABS 0.0 08/25/2017 1047   Iron/TIBC/Ferritin/ %Sat No results found for: IRON, TIBC, FERRITIN, IRONPCTSAT Lipid Panel     Component Value Date/Time   CHOL 176 08/25/2017 1047   TRIG 91 08/25/2017 1047   HDL 84 08/25/2017 1047   CHOLHDL 3 08/24/2010 1134   VLDL 17.0 08/24/2010 1134   LDLCALC 74 08/25/2017 1047   Hepatic Function Panel     Component Value Date/Time   PROT 6.9 06/08/2018 1138   PROT 7.1 04/01/2018 1620   ALBUMIN 4.2 06/08/2018 1138   ALBUMIN 4.9 (H) 04/01/2018 1620   AST 17 06/08/2018 1138   ALT 20 06/08/2018 1138   ALKPHOS 52 06/08/2018 1138   BILITOT 0.7 06/08/2018 1138   BILITOT 0.3 04/01/2018 1620   BILIDIR 0.2 08/24/2010 1134      Component Value Date/Time   TSH 1.900 08/25/2017 1047   TSH 1.12 05/03/2011 1239   TSH 1.34 08/24/2010 1134     Ref. Range 04/01/2018 16:20  Vitamin D, 25-Hydroxy Latest Ref Range: 30.0 - 100.0 ng/mL 59.2     OBESITY BEHAVIORAL INTERVENTION VISIT  Today's visit was # 17   Starting weight: 222 lbs Starting date: 08/25/2017 Today's weight : 156 lbs  Today's date: 07/14/2018 Total lbs lost to date: 39  At least 15 minutes were spent on discussing the following behavioral intervention visit.  ASK: We discussed the diagnosis of obesity with Sharone D Randol Kern today and Leylanie agreed to give [name] permission to discuss obesity behavioral modification therapy today.  ASSESS: Alanta has the diagnosis of obesity and her BMI today is 27.64 Catriona is in the action stage of change   ADVISE: Sophee was educated on the multiple health risks of obesity as well as the benefit of weight loss to improve her health. She was advised of the need for long term treatment and the importance of lifestyle modifications to improve her current health and to decrease her risk of future health  problems.  AGREE: Multiple dietary modification options and treatment options were discussed and  Milady agreed to follow the recommendations documented in the above note.  ARRANGE: Amey was educated on the importance of frequent visits to treat obesity as outlined per CMS and USPSTF guidelines and agreed to schedule her next follow up appointment today.  I, Tammy Wysor, am acting as Location manager for [employer], PA-C I, [name], PA-C have reviewed above note and agree with its content

## 2018-07-23 DIAGNOSIS — M859 Disorder of bone density and structure, unspecified: Secondary | ICD-10-CM | POA: Diagnosis not present

## 2018-07-23 DIAGNOSIS — M8580 Other specified disorders of bone density and structure, unspecified site: Secondary | ICD-10-CM | POA: Diagnosis not present

## 2018-07-23 DIAGNOSIS — M8589 Other specified disorders of bone density and structure, multiple sites: Secondary | ICD-10-CM | POA: Diagnosis not present

## 2018-08-03 ENCOUNTER — Ambulatory Visit (INDEPENDENT_AMBULATORY_CARE_PROVIDER_SITE_OTHER): Payer: Medicare Other | Admitting: Physician Assistant

## 2018-08-03 ENCOUNTER — Encounter (INDEPENDENT_AMBULATORY_CARE_PROVIDER_SITE_OTHER): Payer: Self-pay | Admitting: Physician Assistant

## 2018-08-03 VITALS — BP 99/66 | HR 74 | Temp 97.9°F | Ht 63.0 in | Wt 155.0 lb

## 2018-08-03 DIAGNOSIS — Z6830 Body mass index (BMI) 30.0-30.9, adult: Secondary | ICD-10-CM

## 2018-08-03 DIAGNOSIS — E669 Obesity, unspecified: Secondary | ICD-10-CM

## 2018-08-03 DIAGNOSIS — E8881 Metabolic syndrome: Secondary | ICD-10-CM

## 2018-08-03 DIAGNOSIS — E88819 Insulin resistance, unspecified: Secondary | ICD-10-CM

## 2018-08-03 DIAGNOSIS — E559 Vitamin D deficiency, unspecified: Secondary | ICD-10-CM

## 2018-08-03 MED ORDER — METFORMIN HCL 500 MG PO TABS: 500.0000 mg | ORAL_TABLET | Freq: Every day | ORAL | 0 refills | Status: DC

## 2018-08-03 NOTE — Progress Notes (Signed)
Office: [phone]  /  Fax: [phone]   HPI:   Chief Complaint: OBESITY Jamie [name] is here to discuss her progress with her obesity treatment plan. She is on the Category 2 plan and is following her eating plan approximately 90 % of the time. She states she is exercising 0 minutes 0 times per week. Jamie [name] did well with weight loss. She reports that she has been using avocados for her snack calories to try to help with her hair loss.  Her weight is 155 lb (70.3 kg) today and has had a weight loss of 1 pounds over a period of 3 weeks since her last visit. She has lost 67 lbs since starting treatment with [name].  Insulin Resistance Jamie [name] has a diagnosis of insulin resistance based on her elevated fasting insulin level >5. Although Jamie [name]'s blood glucose readings are still under good control, insulin resistance puts her at greater risk of metabolic syndrome and diabetes. She is taking metformin currently and continues to work on diet and exercise to decrease risk of diabetes. She denies polyphagia.  Vitamin D deficiency Jamie [name] has a diagnosis of vitamin D deficiency. She is currently taking OTC Vit D and denies nausea, vomiting or muscle weakness.  ASSESSMENT AND PLAN:  Insulin resistance - Plan: metFORMIN (GLUCOPHAGE) 500 MG tablet  Vitamin D deficiency  Class 1 obesity with serious comorbidity and body mass index (BMI) of 30.0 to 30.9 in adult, unspecified obesity type - Starting BMI greater then 30  PLAN:  Insulin Resistance Jamie [name] will continue to work on weight loss, exercise, and decreasing simple carbohydrates in her diet to help decrease the risk of diabetes. We dicussed metformin including benefits and risks. She was informed that eating too many simple carbohydrates or too many calories at one sitting increases the likelihood of GI side effects. Jamie [name] agrees to continue taking metformin 500 mg qd #30 with no refills for now and prescription was written today. Jamie [name] agrees to follow up  with our clinic in 3 weeks.  Vitamin D Deficiency Jamie [name] was informed that low vitamin D levels contributes to fatigue and are associated with obesity, breast, and colon cancer. She agrees to continue to take OTC Vit D 2000 IU qd and will follow up for routine testing of vitamin D, at least 2-3 times per year. She was informed of the risk of over-replacement of vitamin D and agrees to not increase her dose unless she discusses this with [name] first. Jamie [name] agrees to follow up with our clinic in 3 weeks.  Obesity Jamie [name] is currently in the action stage of change. As such, her goal is to continue with weight loss efforts She has agreed to follow the Category 2 plan Jamie [name] has been instructed to work up to a goal of 150 minutes of combined cardio and strengthening exercise per week for weight loss and overall health benefits. We discussed the following Behavioral Modification Strategies today: work on meal planning and easy cooking plans and planning for success Jamie [name] has agreed to follow up with our clinic in 3 weeks. She was informed of the importance of frequent follow up visits to maximize her success with intensive lifestyle modifications for her multiple health conditions.  ALLERGIES: Allergies  Allergen Reactions  . Buprenorphine Hcl Rash and Shortness Of Breath  . Morphine And Related Shortness Of Breath and Rash  . Morphine And Related Shortness Of Breath  . Aspirin Other (See Comments)    REACTION: ulcers  Can take 81 mg  . Avelox [Moxifloxacin  Hcl In Nacl] Hives  . Diflucan [Fluconazole] Hives    Whelps and rash c skin peeling  . Diflucan [Fluconazole] Hives  . Latex Rash  . Moxifloxacin Palpitations    Tachycardia     MEDICATIONS: Current Outpatient Medications on File Prior to Visit  Medication Sig Dispense Refill  . Ascorbic Acid (VITAMIN C) 1000 MG tablet Take 1,000 mg by mouth every evening.    Marland Kitchen aspirin EC 81 MG tablet Take 1 tablet (81 mg total) by mouth daily. 90 tablet  3  . BIOTIN PO Take 1 capsule by mouth every evening.     . Cholecalciferol (VITAMIN D3) 2000 units TABS Take 2,000 Units by mouth every evening.     . clidinium-chlordiazePOXIDE (LIBRAX) 5-2.5 MG capsule Take 1 capsule by mouth 2 (two) times daily. 180 capsule 3  . clonazePAM (KLONOPIN) 1 MG tablet Take 0.5 mg by mouth daily as needed (restless leg syndrome).     . conjugated estrogens (PREMARIN) vaginal cream Place 1 Applicatorful vaginally every other day.     . diphenhydrAMINE (BENADRYL) 25 MG tablet Take 25 mg by mouth daily as needed for allergies.     Marland Kitchen esomeprazole (NEXIUM) 40 MG capsule TAKE 1 CAPSULE TWICE A DAY BEFORE MEALS (Patient taking differently: Take 40 mg by mouth 2 (two) times daily before a meal. ) 180 capsule 3  . linaclotide (LINZESS) 145 MCG CAPS capsule Take 1 capsule (145 mcg total) by mouth daily. (Patient taking differently: Take 145 mcg by mouth daily as needed (constipation). ) 90 capsule 3  . metaxalone (SKELAXIN) 800 MG tablet Take 400 mg by mouth daily as needed for muscle spasms.    . metFORMIN (GLUCOPHAGE) 500 MG tablet Take 1 tablet (500 mg total) by mouth daily with breakfast. 30 tablet 0  . Omega-3-6-9 CAPS Take 1 capsule by mouth every evening.    Marland Kitchen oxyCODONE (OXY IR/ROXICODONE) 5 MG immediate release tablet Take 1 tablet (5 mg total) by mouth every 4 (four) hours as needed for moderate pain. 15 tablet 0  . Prenatal Vit-Fe Fumarate-FA (PRENATAL MULTIVITAMIN) TABS tablet Take 1 tablet by mouth every evening.     . Probiotic Product (PROBIOTIC-10 PO) Take 1 capsule by mouth every evening.     Marland Kitchen spironolactone (ALDACTONE) 50 MG tablet Take 50 mg by mouth daily.      No current facility-administered medications on file prior to visit.     PAST MEDICAL HISTORY: Past Medical History:  Diagnosis Date  . Allergic rhinitis   . Anxiety   . Arthritis    left foot  . Asthma   . Back pain   . Colon polyps    adenomatous  . Constipation   . Diverticulosis   .  Dysrhythmia 1999   Dr Johnsie Cancel 2003  . Elevated LFTs   . Fatty liver disease, nonalcoholic   . GERD (gastroesophageal reflux disease)   . Headache(784.0)    migraines with vision changes but no pain  . Heart murmur   . Hypertension   . IBS (irritable bowel syndrome)   . Joint pain   . Migraines   . Muscle cramps   . Obesity   . Palpitations   . Pneumonia 2007  . Renal cell carcinoma ([place]) 2013   minimal partial nephrectomy  . Restless leg syndrome   . Sleep apnea    moderate; wears CPAP, uncertain about setting  . TIA (transient ischemic attack) 09/11/2014  . Ulcer   . Urinary incontinence   .  Vitamin D deficiency     PAST SURGICAL HISTORY: Past Surgical History:  Procedure Laterality Date  . BREAST REDUCTION SURGERY  Jan 1991  . ELBOW SURGERY Left   . KIDNEY SURGERY Right October 2013    Tumor removed   . LAPAROSCOPIC APPENDECTOMY N/A 06/08/2018   Procedure: APPENDECTOMY LAPAROSCOPIC;  Surgeon: Jovita Kussmaul, MD;  Location: [facility];  Service: General;  Laterality: N/A;  . TONSILLECTOMY    . VAGINAL HYSTERECTOMY  1979   partial    SOCIAL HISTORY: Social History   Tobacco Use  . Smoking status: Never Smoker  . Smokeless tobacco: Never Used  Substance Use Topics  . Alcohol use: Yes    Alcohol/week: 2.0 - 3.0 standard drinks    Types: 1 - 2 Glasses of wine, 1 Shots of liquor per week  . Drug use: No    FAMILY HISTORY: Family History  Problem Relation Age of Onset  . Heart disease Father 44  . Lung cancer Father        smoker  . Hypertension Mother   . Anxiety disorder Mother   . Heart disease Brother   . Hypertension Other   . Colon cancer Cousin   . Allergies Daughter   . Allergies Brother   . Melanoma Daughter   . Lung cancer Sister   . Liver disease Neg Hx   . Asthma Neg Hx   . Esophageal cancer Neg Hx   . Rectal cancer Neg Hx   . Stomach cancer Neg Hx     ROS: Review of Systems  Constitutional: Positive for weight loss.  Gastrointestinal:  Negative for nausea and vomiting.  Genitourinary: Negative for frequency.  Musculoskeletal:       Negative for muscle weakness  Endo/Heme/Allergies: Negative for polydipsia.       Negative Polyphagia    PHYSICAL EXAM: Blood pressure 99/66, pulse 74, temperature 97.9 F (36.6 C), temperature source Oral, height 5' 3"  (1.6 m), weight 155 lb (70.3 kg), SpO2 97 %. Body mass index is 27.46 kg/m. Physical Exam Vitals signs reviewed.  Constitutional:      Appearance: Normal appearance. She is obese.  Cardiovascular:     Rate and Rhythm: Normal rate.     Pulses: Normal pulses.  Pulmonary:     Effort: Pulmonary effort is normal.  Musculoskeletal: Normal range of motion.  Skin:    General: Skin is warm and dry.  Neurological:     Mental Status: She is alert and oriented to person, place, and time.  Psychiatric:        Mood and Affect: Mood normal.        Behavior: Behavior normal.     RECENT LABS AND TESTS: BMET    Component Value Date/Time   NA 140 06/09/2018 0456   NA 141 04/01/2018 1620   K 4.5 06/09/2018 0456   CL 107 06/09/2018 0456   CO2 24 06/09/2018 0456   GLUCOSE 135 (H) 06/09/2018 0456   BUN 20 06/09/2018 0456   BUN 15 04/01/2018 1620   CREATININE 0.82 06/09/2018 0456   CALCIUM 9.4 06/09/2018 0456   GFRNONAA >60 06/09/2018 0456   GFRAA >60 06/09/2018 0456   Lab Results  Component Value Date   HGBA1C 5.2 04/01/2018   HGBA1C 5.4 08/25/2017   HGBA1C 5.4 12/16/2011   HGBA1C 5.7 05/03/2011   HGBA1C 5.5 02/15/2010   Lab Results  Component Value Date   INSULIN 5.6 04/01/2018   INSULIN 50.3 (H) 08/25/2017   CBC  Component Value Date/Time   WBC 8.1 06/09/2018 0456   RBC 3.71 (L) 06/09/2018 0456   HGB 11.6 (L) 06/09/2018 0456   HGB 12.7 08/25/2017 1047   HCT 36.6 06/09/2018 0456   HCT 37.0 08/25/2017 1047   PLT 193 06/09/2018 0456   MCV 98.7 06/09/2018 0456   MCV 93 08/25/2017 1047   MCH 31.3 06/09/2018 0456   MCHC 31.7 06/09/2018 0456   RDW 13.3  06/09/2018 0456   RDW 13.2 08/25/2017 1047   LYMPHSABS 1.4 11/06/2017 1726   LYMPHSABS 1.2 08/25/2017 1047   MONOABS 0.6 11/06/2017 1726   EOSABS 0.1 11/06/2017 1726   EOSABS 0.1 08/25/2017 1047   BASOSABS 0.0 11/06/2017 1726   BASOSABS 0.0 08/25/2017 1047   Iron/TIBC/Ferritin/ %Sat No results found for: IRON, TIBC, FERRITIN, IRONPCTSAT Lipid Panel     Component Value Date/Time   CHOL 176 08/25/2017 1047   TRIG 91 08/25/2017 1047   HDL 84 08/25/2017 1047   CHOLHDL 3 08/24/2010 1134   VLDL 17.0 08/24/2010 1134   LDLCALC 74 08/25/2017 1047   Hepatic Function Panel     Component Value Date/Time   PROT 6.9 06/08/2018 1138   PROT 7.1 04/01/2018 1620   ALBUMIN 4.2 06/08/2018 1138   ALBUMIN 4.9 (H) 04/01/2018 1620   AST 17 06/08/2018 1138   ALT 20 06/08/2018 1138   ALKPHOS 52 06/08/2018 1138   BILITOT 0.7 06/08/2018 1138   BILITOT 0.3 04/01/2018 1620   BILIDIR 0.2 08/24/2010 1134      Component Value Date/Time   TSH 1.900 08/25/2017 1047   TSH 1.12 05/03/2011 1239   TSH 1.34 08/24/2010 1134     Ref. Range 04/01/2018 16:20  Vitamin D, 25-Hydroxy Latest Ref Range: 30.0 - 100.0 ng/mL 59.2     OBESITY BEHAVIORAL INTERVENTION VISIT  Today's visit was # 18   Starting weight: 222 lbs Starting date: 08/25/2017 Today's weight :155 lbs Today's date: 08/03/2018 Total lbs lost to date: 48 At least 15 minutes were spent on discussing the following behavioral intervention visit.   ASK: We discussed the diagnosis of obesity with Madlynn D Randol Kern today and Kimbria agreed to give [name] permission to discuss obesity behavioral modification therapy today.  ASSESS: Danahi has the diagnosis of obesity and her BMI today is 27.46 Idara is in the action stage of change   ADVISE: Mirranda was educated on the multiple health risks of obesity as well as the benefit of weight loss to improve her health. She was advised of the need for long term treatment and the importance of lifestyle  modifications to improve her current health and to decrease her risk of future health problems.  AGREE: Multiple dietary modification options and treatment options were discussed and  Jailene agreed to follow the recommendations documented in the above note.  ARRANGE: Jennamarie was educated on the importance of frequent visits to treat obesity as outlined per CMS and USPSTF guidelines and agreed to schedule her next follow up appointment today.  Jamie [name], Tammy Wysor, am acting as Location manager for [employer] Jamie [name], Jamie Potash, PA-C have reviewed above note and agree with its content

## 2018-08-13 ENCOUNTER — Telehealth: Payer: Self-pay | Admitting: Internal Medicine

## 2018-08-14 ENCOUNTER — Telehealth: Payer: Self-pay | Admitting: Internal Medicine

## 2018-08-14 MED ORDER — LINACLOTIDE 145 MCG PO CAPS: 145.0000 ug | ORAL_CAPSULE | Freq: Every day | ORAL | 0 refills | Status: DC

## 2018-08-14 NOTE — Telephone Encounter (Signed)
Spoke with patient and told her the hold up with her Linzess was that her insurance company required a prior authorization on it.  I told her I would leave some samples up front for her to pick up and then let her know when I got a response from her insurance company.  Patient agreed.

## 2018-08-14 NOTE — Telephone Encounter (Signed)
Refilled Linzess - patient needs office visit for further refills

## 2018-08-17 NOTE — Telephone Encounter (Signed)
Prior authorization successfully submitted through [employer].

## 2018-08-19 ENCOUNTER — Other Ambulatory Visit: Payer: Self-pay | Admitting: Internal Medicine

## 2018-08-19 NOTE — Telephone Encounter (Signed)
Linzess Prior [employer]. Approved.

## 2018-08-24 ENCOUNTER — Ambulatory Visit (INDEPENDENT_AMBULATORY_CARE_PROVIDER_SITE_OTHER): Payer: Medicare Other | Admitting: Physician Assistant

## 2018-09-01 ENCOUNTER — Ambulatory Visit (INDEPENDENT_AMBULATORY_CARE_PROVIDER_SITE_OTHER): Payer: Medicare Other | Admitting: Family Medicine

## 2018-09-02 DIAGNOSIS — R32 Unspecified urinary incontinence: Secondary | ICD-10-CM | POA: Diagnosis not present

## 2018-09-02 DIAGNOSIS — N3642 Intrinsic sphincter deficiency (ISD): Secondary | ICD-10-CM | POA: Diagnosis not present

## 2018-09-02 DIAGNOSIS — R3989 Other symptoms and signs involving the genitourinary system: Secondary | ICD-10-CM | POA: Diagnosis not present

## 2018-09-03 ENCOUNTER — Other Ambulatory Visit (INDEPENDENT_AMBULATORY_CARE_PROVIDER_SITE_OTHER): Payer: Self-pay | Admitting: Physician Assistant

## 2018-09-03 ENCOUNTER — Encounter (INDEPENDENT_AMBULATORY_CARE_PROVIDER_SITE_OTHER): Payer: Self-pay | Admitting: Physician Assistant

## 2018-09-03 ENCOUNTER — Ambulatory Visit (INDEPENDENT_AMBULATORY_CARE_PROVIDER_SITE_OTHER): Payer: Medicare Other | Admitting: Physician Assistant

## 2018-09-03 VITALS — BP 104/67 | HR 72 | Temp 98.0°F | Ht 63.0 in | Wt 154.0 lb

## 2018-09-03 DIAGNOSIS — R739 Hyperglycemia, unspecified: Secondary | ICD-10-CM | POA: Diagnosis not present

## 2018-09-03 DIAGNOSIS — E8881 Metabolic syndrome: Secondary | ICD-10-CM

## 2018-09-03 DIAGNOSIS — E559 Vitamin D deficiency, unspecified: Secondary | ICD-10-CM | POA: Diagnosis not present

## 2018-09-03 DIAGNOSIS — E669 Obesity, unspecified: Secondary | ICD-10-CM | POA: Diagnosis not present

## 2018-09-03 DIAGNOSIS — Z6830 Body mass index (BMI) 30.0-30.9, adult: Secondary | ICD-10-CM | POA: Diagnosis not present

## 2018-09-03 MED ORDER — METFORMIN HCL 500 MG PO TABS: 500.0000 mg | ORAL_TABLET | Freq: Every day | ORAL | 0 refills | Status: DC

## 2018-09-04 LAB — COMPREHENSIVE METABOLIC PANEL
ALT: 16 IU/L (ref 0–32)
AST: 17 IU/L (ref 0–40)
Albumin/Globulin Ratio: 2.1 (ref 1.2–2.2)
Albumin: 4.8 g/dL (ref 3.8–4.8)
Alkaline Phosphatase: 55 IU/L (ref 39–117)
BILIRUBIN TOTAL: 0.4 mg/dL (ref 0.0–1.2)
BUN/Creatinine Ratio: 27 (ref 12–28)
BUN: 22 mg/dL (ref 8–27)
CHLORIDE: 101 mmol/L (ref 96–106)
CO2: 23 mmol/L (ref 20–29)
Calcium: 11.1 mg/dL — ABNORMAL HIGH (ref 8.7–10.3)
Creatinine, Ser: 0.81 mg/dL (ref 0.57–1.00)
GFR calc Af Amer: 85 mL/min/{1.73_m2} (ref >59)
GFR calc non Af Amer: 74 mL/min/{1.73_m2} (ref >59)
Globulin, Total: 2.3 g/dL (ref 1.5–4.5)
Glucose: 85 mg/dL (ref 65–99)
Potassium: 5.1 mmol/L (ref 3.5–5.2)
Sodium: 141 mmol/L (ref 134–144)
Total Protein: 7.1 g/dL (ref 6.0–8.5)

## 2018-09-04 LAB — VITAMIN D 25 HYDROXY (VIT D DEFICIENCY, FRACTURES): VIT D 25 HYDROXY: 63.3 ng/mL (ref 30.0–100.0)

## 2018-09-04 LAB — HEMOGLOBIN A1C
Est. average glucose Bld gHb Est-mCnc: 100 mg/dL
Hgb A1c MFr Bld: 5.1 % (ref 4.8–5.6)

## 2018-09-04 LAB — INSULIN, RANDOM: INSULIN: 5.7 u[IU]/mL (ref 2.6–24.9)

## 2018-09-06 NOTE — Progress Notes (Signed)
Office: [phone]  /  Fax: [phone]   HPI:   Chief Complaint: OBESITY Jamie [name] is here to discuss her progress with her obesity treatment plan. She is on the Category 2 plan and is following her eating plan approximately 80 % of the time. She states she is walking 1 mile 2 times per week. Jamie [name] did well with weight loss. She reports that she continues to eat avocados for breakfast.  Her weight is 154 lb (69.9 kg) today and has had a weight loss of 1 pound over a period of 4 to 5 weeks since her last visit. She has lost 68 lbs since starting treatment with [name].  Insulin Resistance Jamie [name] has a diagnosis of insulin resistance based on her elevated fasting insulin level >5. Although Jamie [name]'s blood glucose readings are still under good control, insulin resistance puts her at greater risk of metabolic syndrome and diabetes. She is taking metformin currently and denies nausea, vomiting, or diarrhea. She continues to work on diet and exercise to decrease risk of diabetes. She denies polyphagia.  Vitamin D Deficiency Jamie [name] has a diagnosis of vitamin D deficiency. She is currently taking OTC Vit D and denies nausea, vomiting or muscle weakness.  ALLERGIES: Allergies  Allergen Reactions  . Buprenorphine Hcl Rash and Shortness Of Breath  . Morphine And Related Shortness Of Breath and Rash  . Morphine And Related Shortness Of Breath  . Aspirin Other (See Comments)    REACTION: ulcers  Can take 81 mg  . Avelox [Moxifloxacin Hcl In Nacl] Hives  . Diflucan [Fluconazole] Hives    Whelps and rash c skin peeling  . Diflucan [Fluconazole] Hives  . Latex Rash  . Moxifloxacin Palpitations    Tachycardia     MEDICATIONS: Current Outpatient Medications on File Prior to Visit  Medication Sig Dispense Refill  . Ascorbic Acid (VITAMIN C) 1000 MG tablet Take 1,000 mg by mouth every evening.    Marland Kitchen aspirin EC 81 MG tablet Take 1 tablet (81 mg total) by mouth daily. 90 tablet 3  . BIOTIN PO Take 1  capsule by mouth every evening.     . Cholecalciferol (VITAMIN D3) 2000 units TABS Take 2,000 Units by mouth every evening.     . clidinium-chlordiazePOXIDE (LIBRAX) 5-2.5 MG capsule Take 1 capsule by mouth 2 (two) times daily. 180 capsule 3  . clonazePAM (KLONOPIN) 1 MG tablet Take 0.5 mg by mouth daily as needed (restless leg syndrome).     . conjugated estrogens (PREMARIN) vaginal cream Place 1 Applicatorful vaginally every other day.     . diphenhydrAMINE (BENADRYL) 25 MG tablet Take 25 mg by mouth daily as needed for allergies.     Marland Kitchen esomeprazole (NEXIUM) 40 MG capsule TAKE 1 CAPSULE TWICE A DAY BEFORE MEALS 180 capsule 0  . linaclotide (LINZESS) 145 MCG CAPS capsule Take 1 capsule (145 mcg total) by mouth daily. Patient needs office visit for further refills 90 capsule 0  . metaxalone (SKELAXIN) 800 MG tablet Take 400 mg by mouth daily as needed for muscle spasms.    . Omega-3-6-9 CAPS Take 1 capsule by mouth every evening.    Marland Kitchen oxyCODONE (OXY IR/ROXICODONE) 5 MG immediate release tablet Take 1 tablet (5 mg total) by mouth every 4 (four) hours as needed for moderate pain. 15 tablet 0  . Prenatal Vit-Fe Fumarate-FA (PRENATAL MULTIVITAMIN) TABS tablet Take 1 tablet by mouth every evening.     . Probiotic Product (PROBIOTIC-10 PO) Take 1 capsule by mouth every evening.     Marland Kitchen  spironolactone (ALDACTONE) 50 MG tablet Take 50 mg by mouth daily.      No current facility-administered medications on file prior to visit.     PAST MEDICAL HISTORY: Past Medical History:  Diagnosis Date  . Allergic rhinitis   . Anxiety   . Arthritis    left foot  . Asthma   . Back pain   . Colon polyps    adenomatous  . Constipation   . Diverticulosis   . Dysrhythmia 1999   Dr Johnsie Cancel 2003  . Elevated LFTs   . Fatty liver disease, nonalcoholic   . GERD (gastroesophageal reflux disease)   . Headache(784.0)    migraines with vision changes but no pain  . Heart murmur   . Hypertension   . IBS (irritable  bowel syndrome)   . Joint pain   . Migraines   . Muscle cramps   . Obesity   . Palpitations   . Pneumonia 2007  . Renal cell carcinoma ([place]) 2013   minimal partial nephrectomy  . Restless leg syndrome   . Sleep apnea    moderate; wears CPAP, uncertain about setting  . TIA (transient ischemic attack) 09/11/2014  . Ulcer   . Urinary incontinence   . Vitamin D deficiency     PAST SURGICAL HISTORY: Past Surgical History:  Procedure Laterality Date  . BREAST REDUCTION SURGERY  Jan 1991  . ELBOW SURGERY Left   . KIDNEY SURGERY Right October 2013    Tumor removed   . LAPAROSCOPIC APPENDECTOMY N/A 06/08/2018   Procedure: APPENDECTOMY LAPAROSCOPIC;  Surgeon: Jovita Kussmaul, MD;  Location: [facility];  Service: General;  Laterality: N/A;  . TONSILLECTOMY    . VAGINAL HYSTERECTOMY  1979   partial    SOCIAL HISTORY: Social History   Tobacco Use  . Smoking status: Never Smoker  . Smokeless tobacco: Never Used  Substance Use Topics  . Alcohol use: Yes    Alcohol/week: 2.0 - 3.0 standard drinks    Types: 1 - 2 Glasses of wine, 1 Shots of liquor per week  . Drug use: No    FAMILY HISTORY: Family History  Problem Relation Age of Onset  . Heart disease Father 53  . Lung cancer Father        smoker  . Hypertension Mother   . Anxiety disorder Mother   . Heart disease Brother   . Hypertension Other   . Colon cancer Cousin   . Allergies Daughter   . Allergies Brother   . Melanoma Daughter   . Lung cancer Sister   . Liver disease Neg Hx   . Asthma Neg Hx   . Esophageal cancer Neg Hx   . Rectal cancer Neg Hx   . Stomach cancer Neg Hx     ROS: Review of Systems  Constitutional: Positive for weight loss.  Gastrointestinal: Negative for diarrhea, nausea and vomiting.  Musculoskeletal:       Negative muscle weakness  Endo/Heme/Allergies:       Negative polyphagia    PHYSICAL EXAM: Blood pressure 104/67, pulse 72, temperature 98 F (36.7 C), height 5' 3"  (1.6 m), weight 154  lb (69.9 kg), SpO2 96 %. Body mass index is 27.28 kg/m. Physical Exam Vitals signs reviewed.  Constitutional:      Appearance: Normal appearance. She is obese.  Cardiovascular:     Rate and Rhythm: Normal rate.     Pulses: Normal pulses.  Pulmonary:     Effort: Pulmonary effort is normal.  Breath sounds: Normal breath sounds.  Musculoskeletal: Normal range of motion.  Skin:    General: Skin is warm and dry.  Neurological:     Mental Status: She is alert and oriented to person, place, and time.  Psychiatric:        Mood and Affect: Mood normal.        Behavior: Behavior normal.     RECENT LABS AND TESTS: BMET    Component Value Date/Time   NA 141 09/03/2018 1542   K 5.1 09/03/2018 1542   CL 101 09/03/2018 1542   CO2 23 09/03/2018 1542   GLUCOSE 85 09/03/2018 1542   GLUCOSE 135 (H) 06/09/2018 0456   BUN 22 09/03/2018 1542   CREATININE 0.81 09/03/2018 1542   CALCIUM 11.1 (H) 09/03/2018 1542   GFRNONAA 74 09/03/2018 1542   GFRAA 85 09/03/2018 1542   Lab Results  Component Value Date   HGBA1C 5.1 09/03/2018   HGBA1C 5.2 04/01/2018   HGBA1C 5.4 08/25/2017   HGBA1C 5.4 12/16/2011   HGBA1C 5.7 05/03/2011   Lab Results  Component Value Date   INSULIN 5.7 09/03/2018   INSULIN 5.6 04/01/2018   INSULIN 50.3 (H) 08/25/2017   CBC    Component Value Date/Time   WBC 8.1 06/09/2018 0456   RBC 3.71 (L) 06/09/2018 0456   HGB 11.6 (L) 06/09/2018 0456   HGB 12.7 08/25/2017 1047   HCT 36.6 06/09/2018 0456   HCT 37.0 08/25/2017 1047   PLT 193 06/09/2018 0456   MCV 98.7 06/09/2018 0456   MCV 93 08/25/2017 1047   MCH 31.3 06/09/2018 0456   MCHC 31.7 06/09/2018 0456   RDW 13.3 06/09/2018 0456   RDW 13.2 08/25/2017 1047   LYMPHSABS 1.4 11/06/2017 1726   LYMPHSABS 1.2 08/25/2017 1047   MONOABS 0.6 11/06/2017 1726   EOSABS 0.1 11/06/2017 1726   EOSABS 0.1 08/25/2017 1047   BASOSABS 0.0 11/06/2017 1726   BASOSABS 0.0 08/25/2017 1047   Iron/TIBC/Ferritin/ %Sat No  results found for: IRON, TIBC, FERRITIN, IRONPCTSAT Lipid Panel     Component Value Date/Time   CHOL 176 08/25/2017 1047   TRIG 91 08/25/2017 1047   HDL 84 08/25/2017 1047   CHOLHDL 3 08/24/2010 1134   VLDL 17.0 08/24/2010 1134   LDLCALC 74 08/25/2017 1047   Hepatic Function Panel     Component Value Date/Time   PROT 7.1 09/03/2018 1542   ALBUMIN 4.8 09/03/2018 1542   AST 17 09/03/2018 1542   ALT 16 09/03/2018 1542   ALKPHOS 55 09/03/2018 1542   BILITOT 0.4 09/03/2018 1542   BILIDIR 0.2 08/24/2010 1134      Component Value Date/Time   TSH 1.900 08/25/2017 1047   TSH 1.12 05/03/2011 1239   TSH 1.34 08/24/2010 1134    ASSESSMENT AND PLAN: Insulin resistance - Plan: metFORMIN (GLUCOPHAGE) 500 MG tablet, Comprehensive metabolic panel, Hemoglobin A1c, Insulin, random  Vitamin D deficiency - Plan: VITAMIN D 25 Hydroxy (Vit-D Deficiency, Fractures)  Class 1 obesity with serious comorbidity and body mass index (BMI) of 30.0 to 30.9 in adult, unspecified obesity type  PLAN:  Insulin Resistance Jamie [name] will continue to work on weight loss, exercise, and decreasing simple carbohydrates in her diet to help decrease the risk of diabetes. We dicussed metformin including benefits and risks. She was informed that eating too many simple carbohydrates or too many calories at one sitting increases the likelihood of GI side effects. Jamie [name] agrees to continue taking metformin 500 mg q AM #60 with no  refills. We will check labs today. Jamie [name] agrees to follow up with our clinic in 3 weeks as directed to monitor her progress.  Vitamin D Deficiency Jamie [name] was informed that low vitamin D levels contributes to fatigue and are associated with obesity, breast, and colon cancer. Jamie [name] agrees to continue taking OTC Vit D daily and will follow up for routine testing of vitamin D, at least 2-3 times per year. She was informed of the risk of over-replacement of vitamin D and agrees to not increase her dose unless  she discusses this with [name] first. We will check labs today. Jamie [name] agrees to follow up with our clinic in 3 weeks.  Obesity Jamie [name] is currently in the action stage of change. As such, her goal is to continue with weight loss efforts She has agreed to follow the Category 2 plan Jamie [name] has been instructed to work up to a goal of 150 minutes of combined cardio and strengthening exercise per week for weight loss and overall health benefits. We discussed the following Behavioral Modification Strategies today: work on meal planning and easy cooking plans and planning for success   Jamie [name] has agreed to follow up with our clinic in 3 weeks. She was informed of the importance of frequent follow up visits to maximize her success with intensive lifestyle modifications for her multiple health conditions.   OBESITY BEHAVIORAL INTERVENTION VISIT  Today's visit was # 20  Starting weight: 222 lbs Starting date: 08/25/17 Today's weight : 154 lbs  Today's date: 09/03/2018 Total lbs lost to date: 58 At least 15 minutes were spent on discussing the following behavioral intervention visit.    09/03/2018  Height 5' 3"  (1.6 m)  Weight 154 lb (69.9 kg)  BMI (Calculated) 27.29  BLOOD PRESSURE - SYSTOLIC 127  BLOOD PRESSURE - DIASTOLIC 67   Body Fat % 51.7 %  Total Body Water (lbs) 60.8 lbs    ASK: We discussed the diagnosis of obesity with Jamie [name] today and Jamie [name] agreed to give [name] permission to discuss obesity behavioral modification therapy today.  ASSESS: Jamie [name] has the diagnosis of obesity and her BMI today is 27.29 Nataya is in the action stage of change   ADVISE: Alegandra was educated on the multiple health risks of obesity as well as the benefit of weight loss to improve her health. She was advised of the need for long term treatment and the importance of lifestyle modifications.  AGREE: Multiple dietary modification options and treatment options were discussed and  Tauriel agreed to the above  obesity treatment plan.  Wilhemena Durie, am acting as transcriptionist for [name], PA-C I, [name], PA-C have reviewed above note and agree with its content

## 2018-09-07 ENCOUNTER — Emergency Department (HOSPITAL_COMMUNITY)
Admission: EM | Admit: 2018-09-07 | Discharge: 2018-09-08 | Disposition: A | Discharge status: Home / Self Care | Payer: Medicare Other | Attending: Emergency Medicine | Admitting: Emergency Medicine

## 2018-09-07 DIAGNOSIS — T83098A Other mechanical complication of other indwelling urethral catheter, initial encounter: Secondary | ICD-10-CM | POA: Diagnosis not present

## 2018-09-07 DIAGNOSIS — Z7982 Long term (current) use of aspirin: Secondary | ICD-10-CM | POA: Insufficient documentation

## 2018-09-07 DIAGNOSIS — Z91040 Latex allergy status: Secondary | ICD-10-CM | POA: Diagnosis not present

## 2018-09-07 DIAGNOSIS — N3642 Intrinsic sphincter deficiency (ISD): Secondary | ICD-10-CM | POA: Diagnosis not present

## 2018-09-07 DIAGNOSIS — Z8553 Personal history of malignant neoplasm of renal pelvis: Secondary | ICD-10-CM | POA: Insufficient documentation

## 2018-09-07 DIAGNOSIS — Y732 Prosthetic and other implants, materials and accessory gastroenterology and urology devices associated with adverse incidents: Secondary | ICD-10-CM | POA: Diagnosis not present

## 2018-09-07 DIAGNOSIS — Z466 Encounter for fitting and adjustment of urinary device: Secondary | ICD-10-CM | POA: Diagnosis present

## 2018-09-07 DIAGNOSIS — Z7984 Long term (current) use of oral hypoglycemic drugs: Secondary | ICD-10-CM | POA: Insufficient documentation

## 2018-09-07 DIAGNOSIS — Z79899 Other long term (current) drug therapy: Secondary | ICD-10-CM | POA: Diagnosis not present

## 2018-09-07 DIAGNOSIS — J45909 Unspecified asthma, uncomplicated: Secondary | ICD-10-CM | POA: Insufficient documentation

## 2018-09-07 DIAGNOSIS — N3281 Overactive bladder: Secondary | ICD-10-CM | POA: Diagnosis not present

## 2018-09-07 DIAGNOSIS — T83011A Breakdown (mechanical) of indwelling urethral catheter, initial encounter: Secondary | ICD-10-CM | POA: Insufficient documentation

## 2018-09-07 DIAGNOSIS — I10 Essential (primary) hypertension: Secondary | ICD-10-CM | POA: Insufficient documentation

## 2018-09-07 NOTE — ED Triage Notes (Signed)
Pt had a urethreal procedure done at [facility] today, her catheter was draining fine until about 5pm and it stopped, she states that she's a little uncomfortable  While pulling in the parking lot she felt something pop and she's not clear if it was internal or her catheter, pt thinks her catheter is intact but she's not sure

## 2018-09-07 NOTE — ED Notes (Signed)
Bed: AP70 Expected date:  Expected time:  Means of arrival:  Comments: Triage 1

## 2018-09-08 NOTE — Discharge Instructions (Addendum)
Return if you are unable to urinate at home. Otherwise, follow up with your urologist.

## 2018-09-08 NOTE — ED Provider Notes (Signed)
[facility] DEPT Provider Note   CSN: [id number] Arrival date & time: 09/07/18  2231    History   Chief Complaint Urinary catheter not draining  HPI Jamie [name] is a 73 y.o. female.   The history is provided by the patient.  She has history of hypertension, TIA, GERD, asthma, renal cell carcinoma and had urethral bulking procedure done earlier today.  She was unable to urinate postop, so had a catheter placed.  At home, she notes that it feels like something is sticking in her urethra when she is sitting up, she feels better when laying flat.  At one point this afternoon, she noted blood draining into the drainage bag.  She then felt a pop where the catheter was and she has not had any drainage since then.  She had drunk 2 glasses of water, but has not had any drainage since 5 PM.  She does have a slight sense of needing to urinate.  She was given a replacement catheter in case catheter needed to be changed.  She is scheduled to return to the urologists office in the morning to have the catheter removed.  Past Medical History:  Diagnosis Date  . Allergic rhinitis   . Anxiety   . Arthritis    left foot  . Asthma   . Back pain   . Colon polyps    adenomatous  . Constipation   . Diverticulosis   . Dysrhythmia 1999   Dr Johnsie Cancel 2003  . Elevated LFTs   . Fatty liver disease, nonalcoholic   . GERD (gastroesophageal reflux disease)   . Headache(784.0)    migraines with vision changes but no pain  . Heart murmur   . Hypertension   . IBS (irritable bowel syndrome)   . Joint pain   . Migraines   . Muscle cramps   . Obesity   . Palpitations   . Pneumonia 2007  . Renal cell carcinoma ([place]) 2013   minimal partial nephrectomy  . Restless leg syndrome   . Sleep apnea    moderate; wears CPAP, uncertain about setting  . TIA (transient ischemic attack) 09/11/2014  . Ulcer   . Urinary incontinence   . Vitamin D deficiency     Patient Active  Problem List   Diagnosis Date Noted  . Acute appendicitis 06/08/2018  . Hyponatremia 11/06/2017  . Other fatigue 08/25/2017  . Shortness of breath on exertion 08/25/2017  . Vitamin D deficiency 08/25/2017  . Hyperglycemia 08/25/2017  . 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 TIA (transient ischemic attack) 12/19/2016  . Common migraine 12/19/2016  . Obstructive sleep apnea on CPAP 12/19/2016  . Acute urinary retention 11/21/2016  . 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 Diverticulitis 11/20/2016  . Pain in left ankle and joints of left foot 08/13/2016  . Pain and swelling of left ankle 08/13/2016  . At risk for polypharmacy 10/02/2015  . Intractable migraine with aura with status migrainosus 10/02/2015  . Other specified transient cerebral ischemias 09/16/2014  . 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 Morbid obesity ([place]) 09/16/2014  . Headache 05/03/2013  . Preop exam for internal medicine 03/10/2012  . Kidney mass 03/10/2012  . UTI (lower urinary tract infection) 02/13/2012  . Excessive tearing 12/16/2011  . Leg pain 09/20/2011  . Acute bronchitis 06/27/2011  . Wheezing 06/27/2011  . Rosacea 05/07/2011  . Shoulder pain, right 05/07/2011  . OSA (obstructive sleep apnea) 01/31/2011  .  Confusion state 11/26/2010  . ABNORMAL ELECTROCARDIOGRAM 08/28/2010  . Tinnitus 08/09/2010  . SINUSITIS, CHRONIC 08/09/2010  . Dizziness and giddiness 01/18/2010  . HYPERGLYCEMIA 08/23/2009  . LIVER FUNCTION TESTS, ABNORMAL, HX OF 08/23/2009  . Fatigue 01/16/2009  . PERSONAL HX COLONIC POLYPS 09/21/2008  . ADENOMATOUS COLONIC POLYP 09/20/2008  . ALLERGIC RHINITIS 04/28/2008  . RESTRICTIVE LUNG DISEASE 04/28/2008  . CRAMPS,LEG 09/28/2007  . OBESITY 04/01/2007  . VOCAL CORD DISORDER 04/01/2007  . REDUCTION MAMMOPLASTY, HX OF 04/01/2007  . RESTLESS LEG SYNDROME 02/02/2007  . Essential hypertension 02/02/2007  . MITRAL  VALVE PROLAPSE 02/02/2007  . GERD 02/02/2007  . Irritable bowel syndrome 02/02/2007  . BREAST CYST 02/02/2007    Past Surgical History:  Procedure Laterality Date  . BREAST REDUCTION SURGERY  Jan 1991  . ELBOW SURGERY Left   . KIDNEY SURGERY Right October 2013    Tumor removed   . LAPAROSCOPIC APPENDECTOMY N/A 06/08/2018   Procedure: APPENDECTOMY LAPAROSCOPIC;  Surgeon: Jovita Kussmaul, MD;  Location: [facility];  Service: General;  Laterality: N/A;  . TONSILLECTOMY    . VAGINAL HYSTERECTOMY  1979   partial     OB History    Gravida  2   Para  0   Term  0   Preterm  0   AB  0   Living  2     SAB  0   TAB  0   Ectopic  0   Multiple      Live Births               Home Medications    Prior to Admission medications   Medication Sig Start Date End Date Taking? Authorizing Provider  Ascorbic Acid (VITAMIN C) 1000 MG tablet Take 1,000 mg by mouth every evening.    [provider]  aspirin EC 81 MG tablet Take 1 tablet (81 mg total) by mouth daily. 09/16/14   Dohmeier, Asencion Partridge, MD  BIOTIN PO Take 1 capsule by mouth every evening.     [provider]  Cholecalciferol (VITAMIN D3) 2000 units TABS Take 2,000 Units by mouth every evening.     [provider]  clidinium-chlordiazePOXIDE (LIBRAX) 5-2.5 MG capsule Take 1 capsule by mouth 2 (two) times daily. 09/22/17   Irene Shipper, MD  clonazePAM (KLONOPIN) 1 MG tablet Take 0.5 mg by mouth daily as needed (restless leg syndrome).  03/12/18   [provider]  conjugated estrogens (PREMARIN) vaginal cream Place 1 Applicatorful vaginally every other day.     [provider]  diphenhydrAMINE (BENADRYL) 25 MG tablet Take 25 mg by mouth daily as needed for allergies.     [provider]  esomeprazole (NEXIUM) 40 MG capsule TAKE 1 CAPSULE TWICE A DAY BEFORE MEALS 08/20/18   Irene Shipper, MD  linaclotide [facility]) 145 MCG CAPS capsule Take 1 capsule (145 mcg total) by mouth daily.  Patient needs office visit for further refills 08/14/18   Irene Shipper, MD  metaxalone [facility]) 800 MG tablet Take 400 mg by mouth daily as needed for muscle spasms.    [provider]  metFORMIN (GLUCOPHAGE) 500 MG tablet Take 1 tablet (500 mg total) by mouth daily with breakfast. 09/03/18   [name], PA-C  Omega-3-6-9 CAPS Take 1 capsule by mouth every evening.    [provider]  oxyCODONE (OXY IR/ROXICODONE) 5 MG immediate release tablet Take 1 tablet (5 mg total) by mouth every 4 (four) hours as needed  for moderate pain. 06/09/18   Saverio Danker, PA-C  Prenatal Vit-Fe Fumarate-FA (PRENATAL MULTIVITAMIN) TABS tablet Take 1 tablet by mouth every evening.     [provider]  Probiotic Product (PROBIOTIC-10 PO) Take 1 capsule by mouth every evening.     [provider]  spironolactone (ALDACTONE) 50 MG tablet Take 50 mg by mouth daily.  12/01/17   [provider]    Family History Family History  Problem Relation Age of Onset  . Heart disease Father 46  . Lung cancer Father        smoker  . Hypertension Mother   . Anxiety disorder Mother   . Heart disease Brother   . Hypertension Other   . Colon cancer Cousin   . Allergies Daughter   . Allergies Brother   . Melanoma Daughter   . Lung cancer Sister   . Liver disease Neg Hx   . Asthma Neg Hx   . Esophageal cancer Neg Hx   . Rectal cancer Neg Hx   . Stomach cancer Neg Hx     Social History Social History   Tobacco Use  . Smoking status: Never Smoker  . Smokeless tobacco: Never Used  Substance Use Topics  . Alcohol use: Yes    Alcohol/week: 2.0 - 3.0 standard drinks    Types: 1 - 2 Glasses of wine, 1 Shots of liquor per week  . Drug use: No     Allergies   Buprenorphine hcl; Morphine and related; Morphine and related; Aspirin; Avelox [moxifloxacin hcl in nacl]; Diflucan [fluconazole]; Diflucan [fluconazole]; Latex; and Moxifloxacin   Review of Systems Review of  Systems  All other systems reviewed and are negative.    Physical Exam Updated Vital Signs BP 126/76 (BP Location: Left Arm)   Pulse 77   Temp 98.9 F (37.2 C) (Oral)   Resp 18   Ht 5' 3"  (1.6 m)   Wt 70.3 kg   SpO2 96%   BMI 27.46 kg/m   Physical Exam Vitals signs and nursing note reviewed.    73 year old female, resting comfortably and in no acute distress. Vital signs are normal. Oxygen saturation is 96%, which is normal. Head is normocephalic and atraumatic. PERRLA, EOMI. Oropharynx is clear. Neck is nontender and supple without adenopathy or JVD. Back is nontender and there is no CVA tenderness. Lungs are clear without rales, wheezes, or rhonchi. Chest is nontender. Heart has regular rate and rhythm without murmur. Abdomen is soft, flat, with mild suprapubic tenderness.  There are no masses or hepatosplenomegaly and peristalsis is normoactive. Genitalia: 8 French Foley catheter is present with small amount of clear urine noted in the drainage tubing. Extremities have no cyanosis or edema, full range of motion is present. Skin is warm and dry without rash. Neurologic: Mental status is normal, cranial nerves are intact, there are no motor or sensory deficits.  ED Treatments / Results   Procedures Procedures   Medications Ordered in ED Medications - No data to display   Initial Impression / Assessment and Plan / ED Course  I have reviewed the triage vital signs and the nursing notes.  Possible malfunctioning Foley catheter.  Will check bladder scan.  Old records reviewed confirming urethral bulking procedure done at [facility] earlier today.  Bladder scan showed over 41m.  Attempt was made to irrigate the bladder catheter and it was noted that the catheter had migrated out and the balloon had broken.  This probably was the  pop that she had heard earlier.  Patient will be given opportunity to see if she can urinate on her own.  Patient was able to  urinate successfully.  She is discharged with instructions to follow-up with her urologist.  Final Clinical Impressions(s) / ED Diagnoses   Final diagnoses:  Malfunction of Foley catheter, initial encounter [facility])    ED Discharge Orders    None       Delora Fuel, MD 85/02/77 [id number]

## 2018-09-08 NOTE — ED Notes (Signed)
Catheter balloon found to be ruptured, catheter out of bladder.

## 2018-09-08 NOTE — ED Notes (Signed)
Pt's bladder scanned x3. 449m, 3043m and [id number]

## 2018-09-09 ENCOUNTER — Other Ambulatory Visit: Payer: Self-pay

## 2018-09-09 NOTE — Progress Notes (Signed)
Patient needs office visit for further refills of Linzess

## 2018-09-22 ENCOUNTER — Telehealth: Payer: Self-pay | Admitting: Internal Medicine

## 2018-09-22 ENCOUNTER — Encounter: Payer: Medicare Other | Admitting: Internal Medicine

## 2018-09-22 MED ORDER — LINACLOTIDE 145 MCG PO CAPS: 145.0000 ug | ORAL_CAPSULE | Freq: Every day | ORAL | 1 refills | Status: DC

## 2018-09-22 MED ORDER — CILIDINIUM-CHLORDIAZEPOXIDE 2.5-5 MG PO CAPS: 1.0000 | ORAL_CAPSULE | Freq: Two times a day (BID) | ORAL | 3 refills | Status: DC

## 2018-09-22 MED ORDER — ESOMEPRAZOLE MAGNESIUM 40 MG PO CPDR: DELAYED_RELEASE_CAPSULE | ORAL | 0 refills | Status: DC

## 2018-09-22 NOTE — Telephone Encounter (Signed)
[facility]ed [facility]

## 2018-09-23 ENCOUNTER — Ambulatory Visit (INDEPENDENT_AMBULATORY_CARE_PROVIDER_SITE_OTHER): Payer: Medicare Other | Admitting: Physician Assistant

## 2018-09-28 ENCOUNTER — Ambulatory Visit: Payer: Medicare Other | Admitting: Internal Medicine

## 2018-10-01 ENCOUNTER — Encounter (INDEPENDENT_AMBULATORY_CARE_PROVIDER_SITE_OTHER): Payer: Self-pay

## 2018-11-02 ENCOUNTER — Ambulatory Visit (INDEPENDENT_AMBULATORY_CARE_PROVIDER_SITE_OTHER): Payer: Medicare Other | Admitting: Bariatrics

## 2018-11-02 ENCOUNTER — Other Ambulatory Visit: Payer: Self-pay

## 2018-11-02 ENCOUNTER — Encounter (INDEPENDENT_AMBULATORY_CARE_PROVIDER_SITE_OTHER): Payer: Self-pay | Admitting: Bariatrics

## 2018-11-02 DIAGNOSIS — Z6830 Body mass index (BMI) 30.0-30.9, adult: Secondary | ICD-10-CM

## 2018-11-02 DIAGNOSIS — E8881 Metabolic syndrome: Secondary | ICD-10-CM

## 2018-11-02 DIAGNOSIS — E669 Obesity, unspecified: Secondary | ICD-10-CM | POA: Diagnosis not present

## 2018-11-02 DIAGNOSIS — E559 Vitamin D deficiency, unspecified: Secondary | ICD-10-CM | POA: Diagnosis not present

## 2018-11-02 MED ORDER — METFORMIN HCL 500 MG PO TABS: 500.0000 mg | ORAL_TABLET | Freq: Every day | ORAL | 0 refills | Status: DC

## 2018-11-03 NOTE — Progress Notes (Signed)
Office: [phone]  /  Fax: [phone] TeleHealth Visit:  Jamie [name] has verbally consented to this TeleHealth visit today. The patient is located at home, the provider is located at the [facility] office. The participants in this visit include the listed provider and patient and any and all parties involved. The visit was conducted today via FaceTime.  HPI:   Chief Complaint: OBESITY Jamie [name] is here to discuss her progress with her obesity treatment plan. She is on the Category 2 plan and is following her eating plan approximately 50 % of the time. She states she is walking for 15 minutes 3 to 4 times per week. Jamie [name] states that she has gained 5 to 10 pounds (weight 165 lbs). She normally sees Jamie [name], [place] and her last visit was 09/03/18. Jamie [name] has done some boredom eating. We were unable to weigh the patient today for this TeleHealth visit. She feels as if she has gained weight since her last visit. She has lost 57 lbs since starting treatment with [name].  Insulin Resistance Jamie [name] has a diagnosis of insulin resistance based on her elevated fasting insulin level >5. Although Jamie [name]'s blood glucose readings are still under good control, insulin resistance puts her at greater risk of metabolic syndrome and diabetes. She is taking metformin currently and continues to work on diet and exercise to decrease risk of diabetes. Jamie [name] denies polyphagia.  Vitamin D deficiency Jamie [name] has a diagnosis of vitamin D deficiency. She is currently taking vit D and denies nausea, vomiting or muscle weakness.  ASSESSMENT AND PLAN:  Insulin resistance - Plan: metFORMIN (GLUCOPHAGE) 500 MG tablet, DISCONTINUED: metFORMIN (GLUCOPHAGE) 500 MG tablet  Vitamin D deficiency  Class 1 obesity with serious comorbidity and body mass index (BMI) of 30.0 to 30.9 in adult, unspecified obesity type - Starting BMI greater then 30  PLAN:  Insulin Resistance Jamie [name] will continue to work on weight  loss, exercise, and decreasing simple carbohydrates in her diet to help decrease the risk of diabetes. We dicussed metformin including benefits and risks. She was informed that eating too many simple carbohydrates or too many calories at one sitting increases the likelihood of GI side effects. [name] agreed to continue metformin 500 mg once daily with breakfast #60 with no refills and follow up with [name] as directed to monitor her progress.  Vitamin D Deficiency Jamie [name] was informed that low vitamin D levels contributes to fatigue and are associated with obesity, breast, and colon cancer. She will continue to take Vit D @2 ,000 IU daily and will follow up for routine testing of vitamin D, at least 2-3 times per year. She was informed of the risk of over-replacement of vitamin D and agrees to not increase her dose unless she discusses this with [name] first.  Obesity Jamie [name] is currently in the action stage of change. As such, her goal is to continue with weight loss efforts She has agreed to follow the Category 2 plan Jamie [name] will continue her exercise regimen and she will do resistance bands for weight loss and overall health benefits. We discussed the following Behavioral Modification Strategies today: planning for success, increase H2O intake, no skipping meals, keeping healthy foods in the home, increasing lean protein intake, decreasing simple carbohydrates, increasing vegetables, decrease eating out and work on meal planning and easy cooking plans [name] will weigh herself at home before each visit.  Jamie [name] has agreed to follow up with our clinic in 2 weeks. She was informed of the importance of  frequent follow up visits to maximize her success with intensive lifestyle modifications for her multiple health conditions.  ALLERGIES: Allergies  Allergen Reactions  . Buprenorphine Hcl Rash and Shortness Of Breath  . Morphine And Related Shortness Of Breath and Rash  . Morphine And Related Shortness Of Breath   . Aspirin Other (See Comments)    REACTION: ulcers  Can take 81 mg  . Avelox [Moxifloxacin Hcl In Nacl] Hives  . Diflucan [Fluconazole] Hives    Whelps and rash c skin peeling  . Diflucan [Fluconazole] Hives  . Latex Rash  . Moxifloxacin Palpitations    Tachycardia     MEDICATIONS: Current Outpatient Medications on File Prior to Visit  Medication Sig Dispense Refill  . Ascorbic Acid (VITAMIN C) 1000 MG tablet Take 1,000 mg by mouth daily.     Jamie [name] Kitchen aspirin EC 81 MG tablet Take 1 tablet (81 mg total) by mouth daily. 90 tablet 3  . BIOTIN PO Take 1 capsule by mouth daily.     . Cholecalciferol (VITAMIN D3) 2000 units TABS Take 2,000 Units by mouth daily.     . clidinium-chlordiazePOXIDE (LIBRAX) 5-2.5 MG capsule Take 1 capsule by mouth 2 (two) times daily. 180 capsule 3  . clonazePAM (KLONOPIN) 1 MG tablet Take 0.5 mg by mouth daily as needed (restless leg syndrome).     . conjugated estrogens (PREMARIN) vaginal cream Place 1 Applicatorful vaginally every other day.     . diphenhydrAMINE (BENADRYL) 25 MG tablet Take 25 mg by mouth daily as needed for allergies.     Jamie [name] Kitchen esomeprazole (NEXIUM) 40 MG capsule TAKE 1 CAPSULE TWICE A DAY BEFORE MEALS 180 capsule 0  . linaclotide (LINZESS) 145 MCG CAPS capsule Take 1 capsule (145 mcg total) by mouth daily. Patient needs office visit for further refills 90 capsule 1  . metaxalone (SKELAXIN) 800 MG tablet Take 400 mg by mouth daily as needed for muscle spasms.    . Omega-3-6-9 CAPS Take 1 capsule by mouth every evening.    . Prenatal Vit-Fe Fumarate-FA (PRENATAL MULTIVITAMIN) TABS tablet Take 1 tablet by mouth every evening.     . Probiotic Product (PROBIOTIC-10 PO) Take 1 capsule by mouth 2 (two) times daily.     Jamie [name] Kitchen spironolactone (ALDACTONE) 50 MG tablet Take 50 mg by mouth daily.      No current facility-administered medications on file prior to visit.     PAST MEDICAL HISTORY: Past Medical History:  Diagnosis Date  . Allergic rhinitis   .  Anxiety   . Arthritis    left foot  . Asthma   . Back pain   . Colon polyps    adenomatous  . Constipation   . Diverticulosis   . Dysrhythmia 1999   Dr Johnsie Cancel 2003  . Elevated LFTs   . Fatty liver disease, nonalcoholic   . GERD (gastroesophageal reflux disease)   . Headache(784.0)    migraines with vision changes but no pain  . Heart murmur   . Hypertension   . IBS (irritable bowel syndrome)   . Joint pain   . Migraines   . Muscle cramps   . Obesity   . Palpitations   . Pneumonia 2007  . Renal cell carcinoma ([place]) 2013   minimal partial nephrectomy  . Restless leg syndrome   . Sleep apnea    moderate; wears CPAP, uncertain about setting  . TIA (transient ischemic attack) 09/11/2014  . Ulcer   . Urinary incontinence   . Vitamin D  deficiency     PAST SURGICAL HISTORY: Past Surgical History:  Procedure Laterality Date  . BREAST REDUCTION SURGERY  Jan 1991  . ELBOW SURGERY Left   . KIDNEY SURGERY Right October 2013    Tumor removed   . LAPAROSCOPIC APPENDECTOMY N/A 06/08/2018   Procedure: APPENDECTOMY LAPAROSCOPIC;  Surgeon: Jovita Kussmaul, MD;  Location: [facility];  Service: General;  Laterality: N/A;  . TONSILLECTOMY    . VAGINAL HYSTERECTOMY  1979   partial    SOCIAL HISTORY: Social History   Tobacco Use  . Smoking status: Never Smoker  . Smokeless tobacco: Never Used  Substance Use Topics  . Alcohol use: Yes    Alcohol/week: 2.0 - 3.0 standard drinks    Types: 1 - 2 Glasses of wine, 1 Shots of liquor per week  . Drug use: No    FAMILY HISTORY: Family History  Problem Relation Age of Onset  . Heart disease Father 57  . Lung cancer Father        smoker  . Hypertension Mother   . Anxiety disorder Mother   . Heart disease Brother   . Hypertension Other   . Colon cancer Cousin   . Allergies Daughter   . Allergies Brother   . Melanoma Daughter   . Lung cancer Sister   . Liver disease Neg Hx   . Asthma Neg Hx   . Esophageal cancer Neg Hx   . Rectal  cancer Neg Hx   . Stomach cancer Neg Hx     ROS: Review of Systems  Constitutional: Negative for weight loss.  Gastrointestinal: Negative for nausea and vomiting.  Musculoskeletal:       Negative for muscle weakness  Endo/Heme/Allergies:       Negative for polyphagia    PHYSICAL EXAM: Pt in no acute distress  RECENT LABS AND TESTS: BMET    Component Value Date/Time   NA 141 09/03/2018 1542   K 5.1 09/03/2018 1542   CL 101 09/03/2018 1542   CO2 23 09/03/2018 1542   GLUCOSE 85 09/03/2018 1542   GLUCOSE 135 (H) 06/09/2018 0456   BUN 22 09/03/2018 1542   CREATININE 0.81 09/03/2018 1542   CALCIUM 11.1 (H) 09/03/2018 1542   GFRNONAA 74 09/03/2018 1542   GFRAA 85 09/03/2018 1542   Lab Results  Component Value Date   HGBA1C 5.1 09/03/2018   HGBA1C 5.2 04/01/2018   HGBA1C 5.4 08/25/2017   HGBA1C 5.4 12/16/2011   HGBA1C 5.7 05/03/2011   Lab Results  Component Value Date   INSULIN 5.7 09/03/2018   INSULIN 5.6 04/01/2018   INSULIN 50.3 (H) 08/25/2017   CBC    Component Value Date/Time   WBC 8.1 06/09/2018 0456   RBC 3.71 (L) 06/09/2018 0456   HGB 11.6 (L) 06/09/2018 0456   HGB 12.7 08/25/2017 1047   HCT 36.6 06/09/2018 0456   HCT 37.0 08/25/2017 1047   PLT 193 06/09/2018 0456   MCV 98.7 06/09/2018 0456   MCV 93 08/25/2017 1047   MCH 31.3 06/09/2018 0456   MCHC 31.7 06/09/2018 0456   RDW 13.3 06/09/2018 0456   RDW 13.2 08/25/2017 1047   LYMPHSABS 1.4 11/06/2017 1726   LYMPHSABS 1.2 08/25/2017 1047   MONOABS 0.6 11/06/2017 1726   EOSABS 0.1 11/06/2017 1726   EOSABS 0.1 08/25/2017 1047   BASOSABS 0.0 11/06/2017 1726   BASOSABS 0.0 08/25/2017 1047   Iron/TIBC/Ferritin/ %Sat No results found for: IRON, TIBC, FERRITIN, IRONPCTSAT Lipid Panel  Component Value Date/Time   CHOL 176 08/25/2017 1047   TRIG 91 08/25/2017 1047   HDL 84 08/25/2017 1047   CHOLHDL 3 08/24/2010 1134   VLDL 17.0 08/24/2010 1134   LDLCALC 74 08/25/2017 1047   Hepatic Function  Panel     Component Value Date/Time   PROT 7.1 09/03/2018 1542   ALBUMIN 4.8 09/03/2018 1542   AST 17 09/03/2018 1542   ALT 16 09/03/2018 1542   ALKPHOS 55 09/03/2018 1542   BILITOT 0.4 09/03/2018 1542   BILIDIR 0.2 08/24/2010 1134      Component Value Date/Time   TSH 1.900 08/25/2017 1047   TSH 1.12 05/03/2011 1239   TSH 1.34 08/24/2010 1134     Ref. Range 09/03/2018 15:42  Vitamin D, 25-Hydroxy Latest Ref Range: 30.0 - 100.0 ng/mL 63.3    I, Doreene Nest, am acting as Location manager for [employer]. Owens Shark, DO  I have reviewed the above documentation for accuracy and completeness, and I agree with the above. -Jearld Lesch, DO

## 2018-11-16 ENCOUNTER — Encounter (INDEPENDENT_AMBULATORY_CARE_PROVIDER_SITE_OTHER): Payer: Self-pay | Admitting: Bariatrics

## 2018-11-16 ENCOUNTER — Other Ambulatory Visit: Payer: Self-pay

## 2018-11-16 ENCOUNTER — Ambulatory Visit (INDEPENDENT_AMBULATORY_CARE_PROVIDER_SITE_OTHER): Payer: Medicare Other | Admitting: Bariatrics

## 2018-11-16 DIAGNOSIS — I10 Essential (primary) hypertension: Secondary | ICD-10-CM | POA: Diagnosis not present

## 2018-11-16 DIAGNOSIS — F3289 Other specified depressive episodes: Secondary | ICD-10-CM | POA: Diagnosis not present

## 2018-11-16 DIAGNOSIS — E669 Obesity, unspecified: Secondary | ICD-10-CM

## 2018-11-16 DIAGNOSIS — Z6830 Body mass index (BMI) 30.0-30.9, adult: Secondary | ICD-10-CM | POA: Diagnosis not present

## 2018-11-17 NOTE — Progress Notes (Signed)
Office: [phone]  /  Fax: [phone] TeleHealth Visit:  Jamie [name] has verbally consented to this TeleHealth visit today. The patient is located at home, the provider is located at the [facility] office. The participants in this visit include the listed provider and patient and any and all parties involved. The visit was conducted today via FaceTime.  HPI:   Chief Complaint: OBESITY Jamie [name] is here to discuss her progress with her obesity treatment plan. She is on the Category 2 plan and is following her eating plan approximately 60 % of the time. She states she is walking 15 to 20 minutes 5 times per week. Jamie [name] states that she has lost 1 pound (weight 164 lbs). She has done well over time. She is drinking adequate water. We were unable to weigh the patient today for this TeleHealth visit. She feels as if she has lost weight since her last visit. She has lost 58 lbs since starting treatment with [name].  Hypertension Jamie [name] is a 73 y.o. female with hypertension. Jamie [name] denies lightheadedness. She is working weight loss to help control her blood pressure with the goal of decreasing her risk of heart attack and stroke. Jamie [name] blood pressure is well controlled.  Depression with emotional eating behaviors Jamie [name] is struggles with emotional eating and using food for comfort to the extent that it is negatively impacting her health. She often snacks when she is not hungry. Jamie [name] sometimes feels she is out of control and then feels guilty that she made poor food choices. She is taking Librax and Klonopin per her PCP. She has been working on behavior modification techniques to help reduce her emotional eating and has been somewhat successful. She shows no sign of suicidal or homicidal ideations.  Depression screen PHQ 2/9 08/25/2017  Decreased Interest 1  Down, Depressed, Hopeless 1  PHQ - 2 Score 2  Altered sleeping 0  Tired, decreased energy 1  Change in  appetite 1  Feeling bad or failure about yourself  0  Trouble concentrating 2  Moving slowly or fidgety/restless 0  Suicidal thoughts 0  PHQ-9 Score 6  Difficult doing work/chores Somewhat difficult  Some recent data might be hidden    ASSESSMENT AND PLAN:  Essential hypertension  Other depression - with emotional eating  Class 1 obesity with serious comorbidity and body mass index (BMI) of 30.0 to 30.9 in adult, unspecified obesity type - Starting BMI greater then 30  PLAN:  Hypertension We discussed sodium restriction, working on healthy weight loss, and a regular exercise program as the means to achieve improved blood pressure control. Jamie [name] agreed with this plan and agreed to follow up as directed. We will continue to monitor her blood pressure as well as her progress with the above lifestyle modifications. She will continue her medications as prescribed and will watch for signs of hypotension as she continues her lifestyle modifications.  Depression with Emotional Eating Behaviors We discussed behavior modification techniques today to help Jamie [name] deal with her emotional eating and depression. She will continue her medications and follow up as directed.  Obesity Jamie [name] is currently in the action stage of change. As such, her goal is to continue with weight loss efforts She has agreed to follow the Category 2 plan Jamie [name] will continue her exercise regimen for weight loss and overall health benefits. We discussed the following Behavioral Modification Strategies today: planning for success, increase H2O intake, no skipping meals, keeping healthy foods in the  home, increasing lean protein intake, decreasing simple carbohydrates, increasing vegetables, decrease eating out and work on meal planning and intentional eating Jamie [name] will order some salmon (increase seafood).  Jamie [name] has agreed to follow up with our clinic in 2 weeks. She was informed of the importance of frequent follow up  visits to maximize her success with intensive lifestyle modifications for her multiple health conditions.  ALLERGIES: Allergies  Allergen Reactions  . Buprenorphine Hcl Rash and Shortness Of Breath  . Morphine And Related Shortness Of Breath and Rash  . Morphine And Related Shortness Of Breath  . Aspirin Other (See Comments)    REACTION: ulcers  Can take 81 mg  . Avelox [Moxifloxacin Hcl In Nacl] Hives  . Diflucan [Fluconazole] Hives    Whelps and rash c skin peeling  . Diflucan [Fluconazole] Hives  . Latex Rash  . Moxifloxacin Palpitations    Tachycardia     MEDICATIONS: Current Outpatient Medications on File Prior to Visit  Medication Sig Dispense Refill  . Ascorbic Acid (VITAMIN C) 1000 MG tablet Take 1,000 mg by mouth daily.     Marland Kitchen aspirin EC 81 MG tablet Take 1 tablet (81 mg total) by mouth daily. 90 tablet 3  . BIOTIN PO Take 1 capsule by mouth daily.     . Cholecalciferol (VITAMIN D3) 2000 units TABS Take 2,000 Units by mouth daily.     . clidinium-chlordiazePOXIDE (LIBRAX) 5-2.5 MG capsule Take 1 capsule by mouth 2 (two) times daily. 180 capsule 3  . clonazePAM (KLONOPIN) 1 MG tablet Take 0.5 mg by mouth daily as needed (restless leg syndrome).     . conjugated estrogens (PREMARIN) vaginal cream Place 1 Applicatorful vaginally every other day.     . diphenhydrAMINE (BENADRYL) 25 MG tablet Take 25 mg by mouth daily as needed for allergies.     Marland Kitchen esomeprazole (NEXIUM) 40 MG capsule TAKE 1 CAPSULE TWICE A DAY BEFORE MEALS 180 capsule 0  . linaclotide (LINZESS) 145 MCG CAPS capsule Take 1 capsule (145 mcg total) by mouth daily. Patient needs office visit for further refills 90 capsule 1  . metaxalone (SKELAXIN) 800 MG tablet Take 400 mg by mouth daily as needed for muscle spasms.    . metFORMIN (GLUCOPHAGE) 500 MG tablet Take 1 tablet (500 mg total) by mouth daily with breakfast. 60 tablet 0  . Omega-3-6-9 CAPS Take 1 capsule by mouth every evening.    . Prenatal Vit-Fe  Fumarate-FA (PRENATAL MULTIVITAMIN) TABS tablet Take 1 tablet by mouth every evening.     . Probiotic Product (PROBIOTIC-10 PO) Take 1 capsule by mouth 2 (two) times daily.     Marland Kitchen spironolactone (ALDACTONE) 50 MG tablet Take 50 mg by mouth daily.      No current facility-administered medications on file prior to visit.     PAST MEDICAL HISTORY: Past Medical History:  Diagnosis Date  . Allergic rhinitis   . Anxiety   . Arthritis    left foot  . Asthma   . Back pain   . Colon polyps    adenomatous  . Constipation   . Diverticulosis   . Dysrhythmia 1999   Dr Johnsie Cancel 2003  . Elevated LFTs   . Fatty liver disease, nonalcoholic   . GERD (gastroesophageal reflux disease)   . Headache(784.0)    migraines with vision changes but no pain  . Heart murmur   . Hypertension   . IBS (irritable bowel syndrome)   . Joint pain   . Migraines   .  Muscle cramps   . Obesity   . Palpitations   . Pneumonia 2007  . Renal cell carcinoma ([place]) 2013   minimal partial nephrectomy  . Restless leg syndrome   . Sleep apnea    moderate; wears CPAP, uncertain about setting  . TIA (transient ischemic attack) 09/11/2014  . Ulcer   . Urinary incontinence   . Vitamin D deficiency     PAST SURGICAL HISTORY: Past Surgical History:  Procedure Laterality Date  . BREAST REDUCTION SURGERY  Jan 1991  . ELBOW SURGERY Left   . KIDNEY SURGERY Right October 2013    Tumor removed   . LAPAROSCOPIC APPENDECTOMY N/A 06/08/2018   Procedure: APPENDECTOMY LAPAROSCOPIC;  Surgeon: Jovita Kussmaul, MD;  Location: [facility];  Service: General;  Laterality: N/A;  . TONSILLECTOMY    . VAGINAL HYSTERECTOMY  1979   partial    SOCIAL HISTORY: Social History   Tobacco Use  . Smoking status: Never Smoker  . Smokeless tobacco: Never Used  Substance Use Topics  . Alcohol use: Yes    Alcohol/week: 2.0 - 3.0 standard drinks    Types: 1 - 2 Glasses of wine, 1 Shots of liquor per week  . Drug use: No    FAMILY HISTORY:  Family History  Problem Relation Age of Onset  . Heart disease Father 46  . Lung cancer Father        smoker  . Hypertension Mother   . Anxiety disorder Mother   . Heart disease Brother   . Hypertension Other   . Colon cancer Cousin   . Allergies Daughter   . Allergies Brother   . Melanoma Daughter   . Lung cancer Sister   . Liver disease Neg Hx   . Asthma Neg Hx   . Esophageal cancer Neg Hx   . Rectal cancer Neg Hx   . Stomach cancer Neg Hx     ROS: Review of Systems  Constitutional: Positive for weight loss.  Neurological:       Negative for lightheadedness  Psychiatric/Behavioral: Positive for depression. Negative for suicidal ideas.    PHYSICAL EXAM: Pt in no acute distress  RECENT LABS AND TESTS: BMET    Component Value Date/Time   NA 141 09/03/2018 1542   K 5.1 09/03/2018 1542   CL 101 09/03/2018 1542   CO2 23 09/03/2018 1542   GLUCOSE 85 09/03/2018 1542   GLUCOSE 135 (H) 06/09/2018 0456   BUN 22 09/03/2018 1542   CREATININE 0.81 09/03/2018 1542   CALCIUM 11.1 (H) 09/03/2018 1542   GFRNONAA 74 09/03/2018 1542   GFRAA 85 09/03/2018 1542   Lab Results  Component Value Date   HGBA1C 5.1 09/03/2018   HGBA1C 5.2 04/01/2018   HGBA1C 5.4 08/25/2017   HGBA1C 5.4 12/16/2011   HGBA1C 5.7 05/03/2011   Lab Results  Component Value Date   INSULIN 5.7 09/03/2018   INSULIN 5.6 04/01/2018   INSULIN 50.3 (H) 08/25/2017   CBC    Component Value Date/Time   WBC 8.1 06/09/2018 0456   RBC 3.71 (L) 06/09/2018 0456   HGB 11.6 (L) 06/09/2018 0456   HGB 12.7 08/25/2017 1047   HCT 36.6 06/09/2018 0456   HCT 37.0 08/25/2017 1047   PLT 193 06/09/2018 0456   MCV 98.7 06/09/2018 0456   MCV 93 08/25/2017 1047   MCH 31.3 06/09/2018 0456   MCHC 31.7 06/09/2018 0456   RDW 13.3 06/09/2018 0456   RDW 13.2 08/25/2017 1047   LYMPHSABS 1.4  11/06/2017 1726   LYMPHSABS 1.2 08/25/2017 1047   MONOABS 0.6 11/06/2017 1726   EOSABS 0.1 11/06/2017 1726   EOSABS 0.1  08/25/2017 1047   BASOSABS 0.0 11/06/2017 1726   BASOSABS 0.0 08/25/2017 1047   Iron/TIBC/Ferritin/ %Sat No results found for: IRON, TIBC, FERRITIN, IRONPCTSAT Lipid Panel     Component Value Date/Time   CHOL 176 08/25/2017 1047   TRIG 91 08/25/2017 1047   HDL 84 08/25/2017 1047   CHOLHDL 3 08/24/2010 1134   VLDL 17.0 08/24/2010 1134   LDLCALC 74 08/25/2017 1047   Hepatic Function Panel     Component Value Date/Time   PROT 7.1 09/03/2018 1542   ALBUMIN 4.8 09/03/2018 1542   AST 17 09/03/2018 1542   ALT 16 09/03/2018 1542   ALKPHOS 55 09/03/2018 1542   BILITOT 0.4 09/03/2018 1542   BILIDIR 0.2 08/24/2010 1134      Component Value Date/Time   TSH 1.900 08/25/2017 1047   TSH 1.12 05/03/2011 1239   TSH 1.34 08/24/2010 1134     Ref. Range 09/03/2018 15:42  Vitamin D, 25-Hydroxy Latest Ref Range: 30.0 - 100.0 ng/mL 63.3    I, Doreene Nest, am acting as Location manager for [employer]. Owens Shark, DO  I have reviewed the above documentation for accuracy and completeness, and I agree with the above. -Jearld Lesch, DO

## 2018-12-01 ENCOUNTER — Ambulatory Visit (INDEPENDENT_AMBULATORY_CARE_PROVIDER_SITE_OTHER): Payer: Medicare Other | Admitting: Bariatrics

## 2018-12-01 ENCOUNTER — Other Ambulatory Visit: Payer: Self-pay

## 2018-12-01 ENCOUNTER — Encounter (INDEPENDENT_AMBULATORY_CARE_PROVIDER_SITE_OTHER): Payer: Self-pay | Admitting: Bariatrics

## 2018-12-01 DIAGNOSIS — Z9989 Dependence on other enabling machines and devices: Secondary | ICD-10-CM

## 2018-12-01 DIAGNOSIS — G4733 Obstructive sleep apnea (adult) (pediatric): Secondary | ICD-10-CM

## 2018-12-01 DIAGNOSIS — E669 Obesity, unspecified: Secondary | ICD-10-CM | POA: Diagnosis not present

## 2018-12-01 DIAGNOSIS — Z6831 Body mass index (BMI) 31.0-31.9, adult: Secondary | ICD-10-CM

## 2018-12-01 DIAGNOSIS — R739 Hyperglycemia, unspecified: Secondary | ICD-10-CM | POA: Diagnosis not present

## 2018-12-01 DIAGNOSIS — I10 Essential (primary) hypertension: Secondary | ICD-10-CM | POA: Diagnosis not present

## 2018-12-02 NOTE — Progress Notes (Signed)
Office: [phone]  /  Fax: [phone] TeleHealth Visit:  Jamie [name] has verbally consented to this TeleHealth visit today. The patient is located at home, the provider is located at the [facility] office. The participants in this visit include the listed provider and patient and any and all parties involved. The visit was conducted today via FaceTime.  HPI:   Chief Complaint: OBESITY Jamie [name] is here to discuss her progress with her obesity treatment plan. She is on the Category 2 plan and is following her eating plan approximately 50 % of the time. She states she is walking 15 to 20 minutes 7 times per week. Jamie [name] states that she remains the same weight (weight 164 lbs). She has done very well overall. She is getting adequate water and protein. We were unable to weigh the patient today for this TeleHealth visit. She feels as if she has maintained weight since her last visit. She has lost 58 lbs since starting treatment with [name].  Hypertension [name] is a 73 y.o. female with hypertension. She is taking Aldactone. Jamie [name] denies chest pain or shortness of breath on exertion. She is working weight loss to help control her blood pressure with the goal of decreasing her risk of heart attack and stroke. Addies blood pressure is well controlled.  OSA (obstructive sleep apnea) on CPAP Jamie [name] has a diagnosis of obstructive sleep apnea and she is on CPAP. She is well rested.  Hyperglycemia Jamie [name] has a diagnosis of hyperglycemia. [name] is taking metformin currently and she continues to work on diet and exercise. Her last A1c was at 5.1 and last insulin level was at 5.7.  ASSESSMENT AND PLAN:  Essential hypertension  OSA on CPAP  Hyperglycemia  Class 1 obesity with serious comorbidity and body mass index (BMI) of 31.0 to 31.9 in adult, unspecified obesity type  PLAN:  Hypertension We discussed sodium restriction, working on healthy weight loss, and a  regular exercise program as the means to achieve improved blood pressure control. Serayah agreed with this plan and agreed to follow up as directed. We will continue to monitor her blood pressure as well as her progress with the above lifestyle modifications. She will continue her medications as prescribed and will watch for signs of hypotension as she continues her lifestyle modifications.  OSA (obstructive sleep apnea) on CPAP Abigaile will use CPAP nightly and she will follow up with our clinic in 2 weeks.  Hyperglycemia Jamie [name] will continue to work on weight loss, exercise, and decreasing simple carbohydrates in her diet. She was informed that eating too many simple carbohydrates or too many calories at one sitting increases the likelihood of GI side effects. Jamie [name] agreed to continue metformin and follow up with [name] as directed to monitor her progress. She will continue Metformin.   Obesity Jamie [name] is currently in the action stage of change. As such, her goal is to continue with weight loss efforts She has agreed to follow the Category 2 plan Jamie [name] will continue her exercise regimen for weight loss and overall health benefits. We discussed the following Behavioral Modification Strategies today: increase H2O intake, no skipping meals, keeping healthy foods in the home, increasing lean protein intake, decreasing simple carbohydrates, increasing vegetables, decrease eating out and work on meal planning and easy cooking plans Jamie [name] will weigh herself at home before each visit.  [name] has agreed to follow up with our clinic in 2 weeks. She was informed of the importance of frequent  follow up visits to maximize her success with intensive lifestyle modifications for her multiple health conditions.  ALLERGIES: Allergies  Allergen Reactions  . Buprenorphine Hcl Rash and Shortness Of Breath  . Morphine And Related Shortness Of Breath and Rash  . Morphine And Related Shortness Of Breath  . Aspirin Other  (See Comments)    REACTION: ulcers  Can take 81 mg  . Avelox [Moxifloxacin Hcl In Nacl] Hives  . Diflucan [Fluconazole] Hives    Whelps and rash c skin peeling  . Diflucan [Fluconazole] Hives  . Latex Rash  . Moxifloxacin Palpitations    Tachycardia     MEDICATIONS: Current Outpatient Medications on File Prior to Visit  Medication Sig Dispense Refill  . Ascorbic Acid (VITAMIN C) 1000 MG tablet Take 1,000 mg by mouth daily.     Marland Kitchen aspirin EC 81 MG tablet Take 1 tablet (81 mg total) by mouth daily. 90 tablet 3  . BIOTIN PO Take 1 capsule by mouth daily.     . Cholecalciferol (VITAMIN D3) 2000 units TABS Take 2,000 Units by mouth daily.     . clidinium-chlordiazePOXIDE (LIBRAX) 5-2.5 MG capsule Take 1 capsule by mouth 2 (two) times daily. 180 capsule 3  . clonazePAM (KLONOPIN) 1 MG tablet Take 0.5 mg by mouth daily as needed (restless leg syndrome).     . conjugated estrogens (PREMARIN) vaginal cream Place 1 Applicatorful vaginally every other day.     . diphenhydrAMINE (BENADRYL) 25 MG tablet Take 25 mg by mouth daily as needed for allergies.     Marland Kitchen esomeprazole (NEXIUM) 40 MG capsule TAKE 1 CAPSULE TWICE A DAY BEFORE MEALS 180 capsule 0  . linaclotide (LINZESS) 145 MCG CAPS capsule Take 1 capsule (145 mcg total) by mouth daily. Patient needs office visit for further refills 90 capsule 1  . metaxalone (SKELAXIN) 800 MG tablet Take 400 mg by mouth daily as needed for muscle spasms.    . metFORMIN (GLUCOPHAGE) 500 MG tablet Take 1 tablet (500 mg total) by mouth daily with breakfast. 60 tablet 0  . Omega-3-6-9 CAPS Take 1 capsule by mouth every evening.    . Prenatal Vit-Fe Fumarate-FA (PRENATAL MULTIVITAMIN) TABS tablet Take 1 tablet by mouth every evening.     . Probiotic Product (PROBIOTIC-10 PO) Take 1 capsule by mouth 2 (two) times daily.     Marland Kitchen spironolactone (ALDACTONE) 50 MG tablet Take 50 mg by mouth daily.      No current facility-administered medications on file prior to visit.      PAST MEDICAL HISTORY: Past Medical History:  Diagnosis Date  . Allergic rhinitis   . Anxiety   . Arthritis    left foot  . Asthma   . Back pain   . Colon polyps    adenomatous  . Constipation   . Diverticulosis   . Dysrhythmia 1999   Dr Johnsie Cancel 2003  . Elevated LFTs   . Fatty liver disease, nonalcoholic   . GERD (gastroesophageal reflux disease)   . Headache(784.0)    migraines with vision changes but no pain  . Heart murmur   . Hypertension   . IBS (irritable bowel syndrome)   . Joint pain   . Migraines   . Muscle cramps   . Obesity   . Palpitations   . Pneumonia 2007  . Renal cell carcinoma ([place]) 2013   minimal partial nephrectomy  . Restless leg syndrome   . Sleep apnea    moderate; wears CPAP, uncertain about setting  .  TIA (transient ischemic attack) 09/11/2014  . Ulcer   . Urinary incontinence   . Vitamin D deficiency     PAST SURGICAL HISTORY: Past Surgical History:  Procedure Laterality Date  . BREAST REDUCTION SURGERY  Jan 1991  . ELBOW SURGERY Left   . KIDNEY SURGERY Right October 2013    Tumor removed   . LAPAROSCOPIC APPENDECTOMY N/A 06/08/2018   Procedure: APPENDECTOMY LAPAROSCOPIC;  Surgeon: Jovita Kussmaul, MD;  Location: [facility];  Service: General;  Laterality: N/A;  . TONSILLECTOMY    . VAGINAL HYSTERECTOMY  1979   partial    SOCIAL HISTORY: Social History   Tobacco Use  . Smoking status: Never Smoker  . Smokeless tobacco: Never Used  Substance Use Topics  . Alcohol use: Yes    Alcohol/week: 2.0 - 3.0 standard drinks    Types: 1 - 2 Glasses of wine, 1 Shots of liquor per week  . Drug use: No    FAMILY HISTORY: Family History  Problem Relation Age of Onset  . Heart disease Father 36  . Lung cancer Father        smoker  . Hypertension Mother   . Anxiety disorder Mother   . Heart disease Brother   . Hypertension Other   . Colon cancer Cousin   . Allergies Daughter   . Allergies Brother   . Melanoma Daughter   . Lung cancer  Sister   . Liver disease Neg Hx   . Asthma Neg Hx   . Esophageal cancer Neg Hx   . Rectal cancer Neg Hx   . Stomach cancer Neg Hx     ROS: Review of Systems  Constitutional: Negative for weight loss.  Respiratory: Negative for shortness of breath (on exertion).   Cardiovascular: Negative for chest pain.  Endo/Heme/Allergies:       Positive for hyperglycemia  Psychiatric/Behavioral: The patient does not have insomnia.     PHYSICAL EXAM: Pt in no acute distress  RECENT LABS AND TESTS: BMET    Component Value Date/Time   NA 141 09/03/2018 1542   K 5.1 09/03/2018 1542   CL 101 09/03/2018 1542   CO2 23 09/03/2018 1542   GLUCOSE 85 09/03/2018 1542   GLUCOSE 135 (H) 06/09/2018 0456   BUN 22 09/03/2018 1542   CREATININE 0.81 09/03/2018 1542   CALCIUM 11.1 (H) 09/03/2018 1542   GFRNONAA 74 09/03/2018 1542   GFRAA 85 09/03/2018 1542   Lab Results  Component Value Date   HGBA1C 5.1 09/03/2018   HGBA1C 5.2 04/01/2018   HGBA1C 5.4 08/25/2017   HGBA1C 5.4 12/16/2011   HGBA1C 5.7 05/03/2011   Lab Results  Component Value Date   INSULIN 5.7 09/03/2018   INSULIN 5.6 04/01/2018   INSULIN 50.3 (H) 08/25/2017   CBC    Component Value Date/Time   WBC 8.1 06/09/2018 0456   RBC 3.71 (L) 06/09/2018 0456   HGB 11.6 (L) 06/09/2018 0456   HGB 12.7 08/25/2017 1047   HCT 36.6 06/09/2018 0456   HCT 37.0 08/25/2017 1047   PLT 193 06/09/2018 0456   MCV 98.7 06/09/2018 0456   MCV 93 08/25/2017 1047   MCH 31.3 06/09/2018 0456   MCHC 31.7 06/09/2018 0456   RDW 13.3 06/09/2018 0456   RDW 13.2 08/25/2017 1047   LYMPHSABS 1.4 11/06/2017 1726   LYMPHSABS 1.2 08/25/2017 1047   MONOABS 0.6 11/06/2017 1726   EOSABS 0.1 11/06/2017 1726   EOSABS 0.1 08/25/2017 1047   BASOSABS 0.0 11/06/2017 1726  BASOSABS 0.0 08/25/2017 1047   Iron/TIBC/Ferritin/ %Sat No results found for: IRON, TIBC, FERRITIN, IRONPCTSAT Lipid Panel     Component Value Date/Time   CHOL 176 08/25/2017 1047    TRIG 91 08/25/2017 1047   HDL 84 08/25/2017 1047   CHOLHDL 3 08/24/2010 1134   VLDL 17.0 08/24/2010 1134   LDLCALC 74 08/25/2017 1047   Hepatic Function Panel     Component Value Date/Time   PROT 7.1 09/03/2018 1542   ALBUMIN 4.8 09/03/2018 1542   AST 17 09/03/2018 1542   ALT 16 09/03/2018 1542   ALKPHOS 55 09/03/2018 1542   BILITOT 0.4 09/03/2018 1542   BILIDIR 0.2 08/24/2010 1134      Component Value Date/Time   TSH 1.900 08/25/2017 1047   TSH 1.12 05/03/2011 1239   TSH 1.34 08/24/2010 1134     Ref. Range 09/03/2018 15:42  Vitamin D, 25-Hydroxy Latest Ref Range: 30.0 - 100.0 ng/mL 63.3    I, Doreene Nest, am acting as Location manager for [employer]. Owens Shark, DO  I have reviewed the above documentation for accuracy and completeness, and I agree with the above. -Jearld Lesch, DO

## 2018-12-15 ENCOUNTER — Ambulatory Visit (INDEPENDENT_AMBULATORY_CARE_PROVIDER_SITE_OTHER): Payer: Medicare Other | Admitting: Bariatrics

## 2018-12-15 ENCOUNTER — Other Ambulatory Visit: Payer: Self-pay

## 2018-12-15 DIAGNOSIS — I10 Essential (primary) hypertension: Secondary | ICD-10-CM | POA: Diagnosis not present

## 2018-12-15 DIAGNOSIS — E8881 Metabolic syndrome: Secondary | ICD-10-CM | POA: Diagnosis not present

## 2018-12-15 DIAGNOSIS — E669 Obesity, unspecified: Secondary | ICD-10-CM | POA: Diagnosis not present

## 2018-12-15 DIAGNOSIS — Z6830 Body mass index (BMI) 30.0-30.9, adult: Secondary | ICD-10-CM

## 2018-12-15 MED ORDER — METFORMIN HCL 500 MG PO TABS: 500.0000 mg | ORAL_TABLET | Freq: Every day | ORAL | 0 refills | Status: DC

## 2018-12-16 NOTE — Progress Notes (Signed)
Office: [phone]  /  Fax: [phone] TeleHealth Visit:  KYNDRA CONDRON has verbally consented to this TeleHealth visit today. The patient is located at home, the provider is located at the [facility] office. The participants in this visit include the listed provider and patient and any and all parties involved. The visit was conducted today via FaceTime.  HPI:   Chief Complaint: OBESITY Kelda is here to discuss her progress with her obesity treatment plan. She is on the Category 2 plan and is following her eating plan approximately 50 % of the time. She states she is walking and doing resistance bands and Pilates for 30 minutes 5 to 7 times per week. Jalea states that she has lost 1/2 to 1 pound (weight 163 lbs). She denies any particular struggles. Jahmiyah is drinking adequate water. We were unable to weigh the patient today for this TeleHealth visit. She feels as if she has lost weight since her last visit. She has lost 59 lbs since starting treatment with [name].  Insulin Resistance Daissy has a diagnosis of insulin resistance based on her elevated fasting insulin level >5. Although Gabrial's blood glucose readings are still under good control, insulin resistance puts her at greater risk of metabolic syndrome and diabetes. She is taking metformin currently and continues to work on diet and exercise to decrease risk of diabetes. Miyanna denies polyphagia.  Hypertension Sharna D Killam is a 73 y.o. female with hypertension. She is taking Spironolactone. Aala D Wengert denies lightheadedness. She is working weight loss to help control her blood pressure with the goal of decreasing her risk of heart attack and stroke. Addies blood pressure is currently controlled.  ASSESSMENT AND PLAN:  Insulin resistance - Plan: metFORMIN (GLUCOPHAGE) 500 MG tablet  Essential hypertension  Class 1 obesity with serious comorbidity and body mass index (BMI) of 30.0 to 30.9 in adult,  unspecified obesity type - starting BMI greater then 30  PLAN:  Insulin Resistance Arneshia will continue to work on weight loss, exercise, and decreasing simple carbohydrates in her diet to help decrease the risk of diabetes. We dicussed metformin including benefits and risks. She was informed that eating too many simple carbohydrates or too many calories at one sitting increases the likelihood of GI side effects. Makaylia agrees to continue metformin 500 mg with breakfast daily #60 with no refills and follow up with [name] as directed to monitor her progress.  Hypertension We discussed sodium restriction, working on healthy weight loss, and a regular exercise program as the means to achieve improved blood pressure control. Vi agreed with this plan and agreed to follow up as directed. We will continue to monitor her blood pressure as well as her progress with the above lifestyle modifications. She will continue her medications as prescribed and will watch for signs of hypotension as she continues her lifestyle modifications.  Obesity Kamaryn is currently in the action stage of change. As such, her goal is to continue with weight loss efforts She has agreed to follow the Category 2 plan Marae will continue exercise (variety) and she will maintain for weight loss and overall health benefits. We discussed the following Behavioral Modification Strategies today: increase H2O intake, no skipping meals, keeping healthy foods in the home, increasing lean protein intake, decreasing simple carbohydrates, increasing vegetables, decrease eating out and work on meal planning and easy cooking plans Nillie will weigh herself at home and record.  Beautifull has agreed to follow up with our clinic in 2  weeks. She was informed of the importance of frequent follow up visits to maximize her success with intensive lifestyle modifications for her multiple health conditions.  ALLERGIES: Allergies  Allergen Reactions  .  Buprenorphine Hcl Rash and Shortness Of Breath  . Morphine And Related Shortness Of Breath and Rash  . Morphine And Related Shortness Of Breath  . Aspirin Other (See Comments)    REACTION: ulcers  Can take 81 mg  . Avelox [Moxifloxacin Hcl In Nacl] Hives  . Diflucan [Fluconazole] Hives    Whelps and rash c skin peeling  . Diflucan [Fluconazole] Hives  . Latex Rash  . Moxifloxacin Palpitations    Tachycardia     MEDICATIONS: Current Outpatient Medications on File Prior to Visit  Medication Sig Dispense Refill  . Ascorbic Acid (VITAMIN C) 1000 MG tablet Take 1,000 mg by mouth daily.     Marland Kitchen aspirin EC 81 MG tablet Take 1 tablet (81 mg total) by mouth daily. 90 tablet 3  . BIOTIN PO Take 1 capsule by mouth daily.     . Cholecalciferol (VITAMIN D3) 2000 units TABS Take 2,000 Units by mouth daily.     . clidinium-chlordiazePOXIDE (LIBRAX) 5-2.5 MG capsule Take 1 capsule by mouth 2 (two) times daily. 180 capsule 3  . clonazePAM (KLONOPIN) 1 MG tablet Take 0.5 mg by mouth daily as needed (restless leg syndrome).     . conjugated estrogens (PREMARIN) vaginal cream Place 1 Applicatorful vaginally every other day.     . diphenhydrAMINE (BENADRYL) 25 MG tablet Take 25 mg by mouth daily as needed for allergies.     Marland Kitchen esomeprazole (NEXIUM) 40 MG capsule TAKE 1 CAPSULE TWICE A DAY BEFORE MEALS 180 capsule 0  . linaclotide (LINZESS) 145 MCG CAPS capsule Take 1 capsule (145 mcg total) by mouth daily. Patient needs office visit for further refills 90 capsule 1  . metaxalone (SKELAXIN) 800 MG tablet Take 400 mg by mouth daily as needed for muscle spasms.    . Omega-3-6-9 CAPS Take 1 capsule by mouth every evening.    . Prenatal Vit-Fe Fumarate-FA (PRENATAL MULTIVITAMIN) TABS tablet Take 1 tablet by mouth every evening.     . Probiotic Product (PROBIOTIC-10 PO) Take 1 capsule by mouth 2 (two) times daily.     Marland Kitchen spironolactone (ALDACTONE) 50 MG tablet Take 50 mg by mouth daily.      No current  facility-administered medications on file prior to visit.     PAST MEDICAL HISTORY: Past Medical History:  Diagnosis Date  . Allergic rhinitis   . Anxiety   . Arthritis    left foot  . Asthma   . Back pain   . Colon polyps    adenomatous  . Constipation   . Diverticulosis   . Dysrhythmia 1999   Dr Johnsie Cancel 2003  . Elevated LFTs   . Fatty liver disease, nonalcoholic   . GERD (gastroesophageal reflux disease)   . Headache(784.0)    migraines with vision changes but no pain  . Heart murmur   . Hypertension   . IBS (irritable bowel syndrome)   . Joint pain   . Migraines   . Muscle cramps   . Obesity   . Palpitations   . Pneumonia 2007  . Renal cell carcinoma ([place]) 2013   minimal partial nephrectomy  . Restless leg syndrome   . Sleep apnea    moderate; wears CPAP, uncertain about setting  . TIA (transient ischemic attack) 09/11/2014  . Ulcer   .  Urinary incontinence   . Vitamin D deficiency     PAST SURGICAL HISTORY: Past Surgical History:  Procedure Laterality Date  . BREAST REDUCTION SURGERY  Jan 1991  . ELBOW SURGERY Left   . KIDNEY SURGERY Right October 2013    Tumor removed   . LAPAROSCOPIC APPENDECTOMY N/A 06/08/2018   Procedure: APPENDECTOMY LAPAROSCOPIC;  Surgeon: Jovita Kussmaul, MD;  Location: [facility];  Service: General;  Laterality: N/A;  . TONSILLECTOMY    . VAGINAL HYSTERECTOMY  1979   partial    SOCIAL HISTORY: Social History   Tobacco Use  . Smoking status: Never Smoker  . Smokeless tobacco: Never Used  Substance Use Topics  . Alcohol use: Yes    Alcohol/week: 2.0 - 3.0 standard drinks    Types: 1 - 2 Glasses of wine, 1 Shots of liquor per week  . Drug use: No    FAMILY HISTORY: Family History  Problem Relation Age of Onset  . Heart disease Father 80  . Lung cancer Father        smoker  . Hypertension Mother   . Anxiety disorder Mother   . Heart disease Brother   . Hypertension Other   . Colon cancer Cousin   . Allergies Daughter    . Allergies Brother   . Melanoma Daughter   . Lung cancer Sister   . Liver disease Neg Hx   . Asthma Neg Hx   . Esophageal cancer Neg Hx   . Rectal cancer Neg Hx   . Stomach cancer Neg Hx     ROS: Review of Systems  Constitutional: Positive for weight loss.  Neurological:       Negative for lightheadedness  Endo/Heme/Allergies:       Negative for polyphagia    PHYSICAL EXAM: Pt in no acute distress  RECENT LABS AND TESTS: BMET    Component Value Date/Time   NA 141 09/03/2018 1542   K 5.1 09/03/2018 1542   CL 101 09/03/2018 1542   CO2 23 09/03/2018 1542   GLUCOSE 85 09/03/2018 1542   GLUCOSE 135 (H) 06/09/2018 0456   BUN 22 09/03/2018 1542   CREATININE 0.81 09/03/2018 1542   CALCIUM 11.1 (H) 09/03/2018 1542   GFRNONAA 74 09/03/2018 1542   GFRAA 85 09/03/2018 1542   Lab Results  Component Value Date   HGBA1C 5.1 09/03/2018   HGBA1C 5.2 04/01/2018   HGBA1C 5.4 08/25/2017   HGBA1C 5.4 12/16/2011   HGBA1C 5.7 05/03/2011   Lab Results  Component Value Date   INSULIN 5.7 09/03/2018   INSULIN 5.6 04/01/2018   INSULIN 50.3 (H) 08/25/2017   CBC    Component Value Date/Time   WBC 8.1 06/09/2018 0456   RBC 3.71 (L) 06/09/2018 0456   HGB 11.6 (L) 06/09/2018 0456   HGB 12.7 08/25/2017 1047   HCT 36.6 06/09/2018 0456   HCT 37.0 08/25/2017 1047   PLT 193 06/09/2018 0456   MCV 98.7 06/09/2018 0456   MCV 93 08/25/2017 1047   MCH 31.3 06/09/2018 0456   MCHC 31.7 06/09/2018 0456   RDW 13.3 06/09/2018 0456   RDW 13.2 08/25/2017 1047   LYMPHSABS 1.4 11/06/2017 1726   LYMPHSABS 1.2 08/25/2017 1047   MONOABS 0.6 11/06/2017 1726   EOSABS 0.1 11/06/2017 1726   EOSABS 0.1 08/25/2017 1047   BASOSABS 0.0 11/06/2017 1726   BASOSABS 0.0 08/25/2017 1047   Iron/TIBC/Ferritin/ %Sat No results found for: IRON, TIBC, FERRITIN, IRONPCTSAT Lipid Panel     Component  Value Date/Time   CHOL 176 08/25/2017 1047   TRIG 91 08/25/2017 1047   HDL 84 08/25/2017 1047   CHOLHDL 3  08/24/2010 1134   VLDL 17.0 08/24/2010 1134   LDLCALC 74 08/25/2017 1047   Hepatic Function Panel     Component Value Date/Time   PROT 7.1 09/03/2018 1542   ALBUMIN 4.8 09/03/2018 1542   AST 17 09/03/2018 1542   ALT 16 09/03/2018 1542   ALKPHOS 55 09/03/2018 1542   BILITOT 0.4 09/03/2018 1542   BILIDIR 0.2 08/24/2010 1134      Component Value Date/Time   TSH 1.900 08/25/2017 1047   TSH 1.12 05/03/2011 1239   TSH 1.34 08/24/2010 1134     Ref. Range 09/03/2018 15:42  Vitamin D, 25-Hydroxy Latest Ref Range: 30.0 - 100.0 ng/mL 63.3    I, Doreene Nest, am acting as Location manager for [employer]. Owens Shark, DO  I have reviewed the above documentation for accuracy and completeness, and I agree with the above. -Jearld Lesch, DO

## 2018-12-17 ENCOUNTER — Encounter (INDEPENDENT_AMBULATORY_CARE_PROVIDER_SITE_OTHER): Payer: Self-pay | Admitting: Bariatrics

## 2018-12-29 ENCOUNTER — Other Ambulatory Visit (INDEPENDENT_AMBULATORY_CARE_PROVIDER_SITE_OTHER): Payer: Self-pay | Admitting: Bariatrics

## 2018-12-29 ENCOUNTER — Telehealth (INDEPENDENT_AMBULATORY_CARE_PROVIDER_SITE_OTHER): Payer: Medicare Other | Admitting: Bariatrics

## 2018-12-29 ENCOUNTER — Encounter (INDEPENDENT_AMBULATORY_CARE_PROVIDER_SITE_OTHER): Payer: Self-pay | Admitting: Bariatrics

## 2018-12-29 ENCOUNTER — Other Ambulatory Visit: Payer: Self-pay

## 2018-12-29 DIAGNOSIS — E8881 Metabolic syndrome: Secondary | ICD-10-CM

## 2018-12-29 DIAGNOSIS — Z6830 Body mass index (BMI) 30.0-30.9, adult: Secondary | ICD-10-CM | POA: Diagnosis not present

## 2018-12-29 DIAGNOSIS — E669 Obesity, unspecified: Secondary | ICD-10-CM

## 2018-12-29 DIAGNOSIS — I10 Essential (primary) hypertension: Secondary | ICD-10-CM

## 2018-12-30 NOTE — Progress Notes (Signed)
Office: [phone]  /  Fax: [phone] TeleHealth Visit:  Jamie [name] has verbally consented to this TeleHealth visit today. The patient is located at home, the provider is located at the [employer] [facility] office. The participants in this visit include the listed provider and patient. The visit was conducted today via FaceTime.  HPI:   Chief Complaint: OBESITY Jamie [name] is here to discuss her progress with her obesity treatment plan. She is on the Category 2 plan and is following her eating plan approximately 50% of the time. She states she is walking the dog daily.  Jamie [name] states that her weight has remained the same. She has been stressed due to COVID-19. She reports drinking adequate water but is not always getting all of her protein in.  We were unable to weigh the patient today for this TeleHealth visit. She feels as if she has maintained her weight since her last visit. She has lost 68 lbs since starting treatment with [name].  Insulin Resistance Jamie [name] has a diagnosis of insulin resistance based on her elevated fasting insulin level >5. Although Jamie [name]'s blood glucose readings are still under good control, insulin resistance puts her at greater risk of metabolic syndrome and diabetes. She is taking metformin currently and continues to work on diet and exercise to decrease risk of diabetes.  Hypertension Jamie [name] is a 73 y.o. female with hypertension and is taking spironolactone.  Jamie [name] denies chest pain or shortness of breath on exertion. She is working weight loss to help control her blood pressure with the goal of decreasing her risk of heart attack and stroke. Jamie [name]'s blood pressure is currently well controlled.  ASSESSMENT AND PLAN:  Insulin resistance  Essential hypertension  Class 1 obesity with serious comorbidity and body mass index (BMI) of 30.0 to 30.9 in adult, unspecified obesity type - Starting BMI greater then 30  PLAN:  Insulin Resistance  [name] will continue to work on weight loss, exercise, and decreasing simple carbohydrates in her diet to help decrease the risk of diabetes. We dicussed metformin including benefits and risks. She was informed that eating too many simple carbohydrates or too many calories at one sitting increases the likelihood of GI side effects. [name] will continue taking metformin and follow-up with [name] as directed to monitor her progress.  Hypertension We discussed sodium restriction, working on healthy weight loss, and a regular exercise program as the means to achieve improved blood pressure control. Jamie [name] agreed with this plan and agreed to follow up as directed. We will continue to monitor her blood pressure as well as her progress with the above lifestyle modifications. She will continue her medications as prescribed and will watch for signs of hypotension as she continues her lifestyle modifications.  Obesity Jamie [name] is currently in the action stage of change. As such, her goal is to continue with weight loss efforts. She has agreed to follow the Category 2 plan. Jamie [name] will work on meal planning, will weigh herself at home until she returns to the office, and will increase her protein. Jamie [name] has been instructed to continue walking the dog daily for weight loss and overall health benefits. She will also do some Pilates. We discussed the following Behavioral Modification Strategies today: increasing lean protein intake, decreasing simple carbohydrates, increasing vegetables, increase H20 intake, decrease eating out, no skipping meals, work on meal planning and easy cooking plans, and keeping healthy foods in the home.  Jamie [name] has agreed to follow-up with our clinic in  2 weeks. She was informed of the importance of frequent follow-up visits to maximize her success with intensive lifestyle modifications for her multiple health conditions.  ALLERGIES: Allergies  Allergen Reactions  . Buprenorphine Hcl Rash and  Shortness Of Breath  . Morphine And Related Shortness Of Breath and Rash  . Morphine And Related Shortness Of Breath  . Aspirin Other (See Comments)    REACTION: ulcers  Can take 81 mg  . Avelox [Moxifloxacin Hcl In Nacl] Hives  . Diflucan [Fluconazole] Hives    Whelps and rash c skin peeling  . Diflucan [Fluconazole] Hives  . Latex Rash  . Moxifloxacin Palpitations    Tachycardia     MEDICATIONS: Current Outpatient Medications on File Prior to Visit  Medication Sig Dispense Refill  . Ascorbic Acid (VITAMIN C) 1000 MG tablet Take 1,000 mg by mouth daily.     Jamie [name] aspirin EC 81 MG tablet Take 1 tablet (81 mg total) by mouth daily. 90 tablet 3  . BIOTIN PO Take 1 capsule by mouth daily.     . Cholecalciferol (VITAMIN D3) 2000 units TABS Take 2,000 Units by mouth daily.     . clidinium-chlordiazePOXIDE (LIBRAX) 5-2.5 MG capsule Take 1 capsule by mouth 2 (two) times daily. 180 capsule 3  . clonazePAM (KLONOPIN) 1 MG tablet Take 0.5 mg by mouth daily as needed (restless leg syndrome).     . conjugated estrogens (PREMARIN) vaginal cream Place 1 Applicatorful vaginally every other day.     . diphenhydrAMINE (BENADRYL) 25 MG tablet Take 25 mg by mouth daily as needed for allergies.     Jamie [name] esomeprazole (NEXIUM) 40 MG capsule TAKE 1 CAPSULE TWICE A DAY BEFORE MEALS 180 capsule 0  . linaclotide (LINZESS) 145 MCG CAPS capsule Take 1 capsule (145 mcg total) by mouth daily. Patient needs office visit for further refills 90 capsule 1  . metaxalone (SKELAXIN) 800 MG tablet Take 400 mg by mouth daily as needed for muscle spasms.    . metFORMIN (GLUCOPHAGE) 500 MG tablet Take 1 tablet (500 mg total) by mouth daily with breakfast. 60 tablet 0  . Omega-3-6-9 CAPS Take 1 capsule by mouth every evening.    . Prenatal Vit-Fe Fumarate-FA (PRENATAL MULTIVITAMIN) TABS tablet Take 1 tablet by mouth every evening.     . Probiotic Product (PROBIOTIC-10 PO) Take 1 capsule by mouth 2 (two) times daily.     Jamie [name]  spironolactone (ALDACTONE) 50 MG tablet Take 50 mg by mouth daily.      No current facility-administered medications on file prior to visit.     PAST MEDICAL HISTORY: Past Medical History:  Diagnosis Date  . Allergic rhinitis   . Anxiety   . Arthritis    left foot  . Asthma   . Back pain   . Colon polyps    adenomatous  . Constipation   . Diverticulosis   . Dysrhythmia 1999   Dr Johnsie Cancel 2003  . Elevated LFTs   . Fatty liver disease, nonalcoholic   . GERD (gastroesophageal reflux disease)   . Headache(784.0)    migraines with vision changes but no pain  . Heart murmur   . Hypertension   . IBS (irritable bowel syndrome)   . Joint pain   . Migraines   . Muscle cramps   . Obesity   . Palpitations   . Pneumonia 2007  . Renal cell carcinoma ([place]) 2013   minimal partial nephrectomy  . Restless leg syndrome   . Sleep apnea  moderate; wears CPAP, uncertain about setting  . TIA (transient ischemic attack) 09/11/2014  . Ulcer   . Urinary incontinence   . Vitamin D deficiency     PAST SURGICAL HISTORY: Past Surgical History:  Procedure Laterality Date  . BREAST REDUCTION SURGERY  Jan 1991  . ELBOW SURGERY Left   . KIDNEY SURGERY Right October 2013    Tumor removed   . LAPAROSCOPIC APPENDECTOMY N/A 06/08/2018   Procedure: APPENDECTOMY LAPAROSCOPIC;  Surgeon: Jovita Kussmaul, MD;  Location: [facility];  Service: General;  Laterality: N/A;  . TONSILLECTOMY    . VAGINAL HYSTERECTOMY  1979   partial    SOCIAL HISTORY: Social History   Tobacco Use  . Smoking status: Never Smoker  . Smokeless tobacco: Never Used  Substance Use Topics  . Alcohol use: Yes    Alcohol/week: 2.0 - 3.0 standard drinks    Types: 1 - 2 Glasses of wine, 1 Shots of liquor per week  . Drug use: No    FAMILY HISTORY: Family History  Problem Relation Age of Onset  . Heart disease Father 5  . Lung cancer Father        smoker  . Hypertension Mother   . Anxiety disorder Mother   . Heart disease  Brother   . Hypertension Other   . Colon cancer Cousin   . Allergies Daughter   . Allergies Brother   . Melanoma Daughter   . Lung cancer Sister   . Liver disease Neg Hx   . Asthma Neg Hx   . Esophageal cancer Neg Hx   . Rectal cancer Neg Hx   . Stomach cancer Neg Hx    ROS: Review of Systems  Respiratory: Negative for shortness of breath.   Cardiovascular: Negative for chest pain.   PHYSICAL EXAM: Pt in no acute distress  RECENT LABS AND TESTS: BMET    Component Value Date/Time   NA 141 09/03/2018 1542   K 5.1 09/03/2018 1542   CL 101 09/03/2018 1542   CO2 23 09/03/2018 1542   GLUCOSE 85 09/03/2018 1542   GLUCOSE 135 (H) 06/09/2018 0456   BUN 22 09/03/2018 1542   CREATININE 0.81 09/03/2018 1542   CALCIUM 11.1 (H) 09/03/2018 1542   GFRNONAA 74 09/03/2018 1542   GFRAA 85 09/03/2018 1542   Lab Results  Component Value Date   HGBA1C 5.1 09/03/2018   HGBA1C 5.2 04/01/2018   HGBA1C 5.4 08/25/2017   HGBA1C 5.4 12/16/2011   HGBA1C 5.7 05/03/2011   Lab Results  Component Value Date   INSULIN 5.7 09/03/2018   INSULIN 5.6 04/01/2018   INSULIN 50.3 (H) 08/25/2017   CBC    Component Value Date/Time   WBC 8.1 06/09/2018 0456   RBC 3.71 (L) 06/09/2018 0456   HGB 11.6 (L) 06/09/2018 0456   HGB 12.7 08/25/2017 1047   HCT 36.6 06/09/2018 0456   HCT 37.0 08/25/2017 1047   PLT 193 06/09/2018 0456   MCV 98.7 06/09/2018 0456   MCV 93 08/25/2017 1047   MCH 31.3 06/09/2018 0456   MCHC 31.7 06/09/2018 0456   RDW 13.3 06/09/2018 0456   RDW 13.2 08/25/2017 1047   LYMPHSABS 1.4 11/06/2017 1726   LYMPHSABS 1.2 08/25/2017 1047   MONOABS 0.6 11/06/2017 1726   EOSABS 0.1 11/06/2017 1726   EOSABS 0.1 08/25/2017 1047   BASOSABS 0.0 11/06/2017 1726   BASOSABS 0.0 08/25/2017 1047   Iron/TIBC/Ferritin/ %Sat No results found for: IRON, TIBC, FERRITIN, IRONPCTSAT Lipid Panel  Component Value Date/Time   CHOL 176 08/25/2017 1047   TRIG 91 08/25/2017 1047   HDL 84  08/25/2017 1047   CHOLHDL 3 08/24/2010 1134   VLDL 17.0 08/24/2010 1134   LDLCALC 74 08/25/2017 1047   Hepatic Function Panel     Component Value Date/Time   PROT 7.1 09/03/2018 1542   ALBUMIN 4.8 09/03/2018 1542   AST 17 09/03/2018 1542   ALT 16 09/03/2018 1542   ALKPHOS 55 09/03/2018 1542   BILITOT 0.4 09/03/2018 1542   BILIDIR 0.2 08/24/2010 1134      Component Value Date/Time   TSH 1.900 08/25/2017 1047   TSH 1.12 05/03/2011 1239   TSH 1.34 08/24/2010 1134   Results for Giacomo, Alpha D "[name]" (MRN [id number]) as of 12/30/2018 07:13  Ref. Range 09/03/2018 15:42  Vitamin D, 25-Hydroxy Latest Ref Range: 30.0 - 100.0 ng/mL 63.3    I, Michaelene Song, am acting as Location manager for [employer], DO  I have reviewed the above documentation for accuracy and completeness, and I agree with the above. -Jearld Lesch, DO

## 2019-01-12 ENCOUNTER — Telehealth (INDEPENDENT_AMBULATORY_CARE_PROVIDER_SITE_OTHER): Payer: Medicare Other | Admitting: Bariatrics

## 2019-01-12 ENCOUNTER — Other Ambulatory Visit: Payer: Self-pay

## 2019-01-12 ENCOUNTER — Encounter (INDEPENDENT_AMBULATORY_CARE_PROVIDER_SITE_OTHER): Payer: Self-pay | Admitting: Bariatrics

## 2019-01-12 DIAGNOSIS — G4733 Obstructive sleep apnea (adult) (pediatric): Secondary | ICD-10-CM

## 2019-01-12 DIAGNOSIS — Z6834 Body mass index (BMI) 34.0-34.9, adult: Secondary | ICD-10-CM

## 2019-01-12 DIAGNOSIS — E669 Obesity, unspecified: Secondary | ICD-10-CM

## 2019-01-12 DIAGNOSIS — Z9989 Dependence on other enabling machines and devices: Secondary | ICD-10-CM

## 2019-01-12 DIAGNOSIS — I10 Essential (primary) hypertension: Secondary | ICD-10-CM | POA: Diagnosis not present

## 2019-01-12 NOTE — Progress Notes (Signed)
Office: [phone]  /  Fax: [phone] TeleHealth Visit:  Jamie [name] has verbally consented to this TeleHealth visit today. The patient is located at home, the provider is located at the [employer] [facility] office. The participants in this visit include the listed provider and patient. The visit was conducted today via FaceTime.  HPI:   Chief Complaint: OBESITY Jamie [name] is here to discuss her progress with her obesity treatment plan. She is on the Category 2 plan and is following her eating plan approximately 50% of the time. She states she is walking/exercising on treadmill 15-20 minutes 4-5 times per week. Jamie [name] states that her weight has remained the same (weight 164) and she reports doing well with water intake.  We were unable to weigh the patient today for this TeleHealth visit. She feels as if she has maintained her weight since her last visit. She has lost 68 lbs since starting treatment with [name].  Hypertension Jamie [name] is a 73 y.o. female with hypertension and is taking spironolactone.  Jamie [name] denies chest pain or shortness of breath on exertion. She is working weight loss to help control her blood pressure with the goal of decreasing her risk of heart attack and stroke. Jamie [name] blood pressure iscurrently well controlled.  Obstructive Sleep Apnea, on CPAP Jamie [name] reports having restful sleep but wakes at night and naps.  ASSESSMENT AND PLAN:  Essential hypertension  Obstructive sleep apnea on CPAP  Class 1 obesity with serious comorbidity and body mass index (BMI) of 34.0 to 34.9 in adult, unspecified obesity type  PLAN:  Hypertension We discussed sodium restriction, working on healthy weight loss, and a regular exercise program as the means to achieve improved blood pressure control. Jamie [name] agreed with this plan and agreed to follow up as directed. We will continue to monitor her blood pressure as well as her progress with the above lifestyle  modifications. She will continue her medications as prescribed and will watch for signs of hypotension as she continues her lifestyle modifications.  Obstructive Sleep Apnea, on CPAP Jamie [name] was instructed to continue her CPAP.  Obesity Jamie [name] is currently in the action stage of change. As such, her goal is to continue with weight loss efforts. She has agreed to follow the Category 2 plan. Jamie [name] will work on meal planning, intentional eating, increase protein (sheet given for protein sources). Jamie [name] has been instructed to continue her current exercise regimen for weight loss and overall health benefits. We discussed the following Behavioral Modification Strategies today: increasing lean protein intake, decreasing simple carbohydrates, increasing vegetables, increase H20 intake, decrease eating out, no skipping meals, work on meal planning, easy cooking plans, keeping healthy foods in the home, and planning for success.  Jamie [name] has agreed to follow-up with our clinic in 2 weeks. She was informed of the importance of frequent follow-up visits to maximize her success with intensive lifestyle modifications for her multiple health conditions.  ALLERGIES: Allergies  Allergen Reactions  . Buprenorphine Hcl Rash and Shortness Of Breath  . Morphine And Related Shortness Of Breath and Rash  . Morphine And Related Shortness Of Breath  . Aspirin Other (See Comments)    REACTION: ulcers  Can take 81 mg  . Avelox [Moxifloxacin Hcl In Nacl] Hives  . Diflucan [Fluconazole] Hives    Whelps and rash c skin peeling  . Diflucan [Fluconazole] Hives  . Latex Rash  . Moxifloxacin Palpitations    Tachycardia     MEDICATIONS: Current Outpatient Medications on  File Prior to Visit  Medication Sig Dispense Refill  . Ascorbic Acid (VITAMIN C) 1000 MG tablet Take 1,000 mg by mouth daily.     Marland Kitchen aspirin EC 81 MG tablet Take 1 tablet (81 mg total) by mouth daily. 90 tablet 3  . BIOTIN PO Take 1 capsule by mouth  daily.     . Cholecalciferol (VITAMIN D3) 2000 units TABS Take 2,000 Units by mouth daily.     . clidinium-chlordiazePOXIDE (LIBRAX) 5-2.5 MG capsule Take 1 capsule by mouth 2 (two) times daily. 180 capsule 3  . clonazePAM (KLONOPIN) 1 MG tablet Take 0.5 mg by mouth daily as needed (restless leg syndrome).     . conjugated estrogens (PREMARIN) vaginal cream Place 1 Applicatorful vaginally every other day.     . diphenhydrAMINE (BENADRYL) 25 MG tablet Take 25 mg by mouth daily as needed for allergies.     Marland Kitchen esomeprazole (NEXIUM) 40 MG capsule TAKE 1 CAPSULE TWICE A DAY BEFORE MEALS 180 capsule 0  . linaclotide (LINZESS) 145 MCG CAPS capsule Take 1 capsule (145 mcg total) by mouth daily. Patient needs office visit for further refills 90 capsule 1  . metaxalone (SKELAXIN) 800 MG tablet Take 400 mg by mouth daily as needed for muscle spasms.    . metFORMIN (GLUCOPHAGE) 500 MG tablet Take 1 tablet (500 mg total) by mouth daily with breakfast. 60 tablet 0  . Omega-3-6-9 CAPS Take 1 capsule by mouth every evening.    . Prenatal Vit-Fe Fumarate-FA (PRENATAL MULTIVITAMIN) TABS tablet Take 1 tablet by mouth every evening.     . Probiotic Product (PROBIOTIC-10 PO) Take 1 capsule by mouth 2 (two) times daily.     Marland Kitchen spironolactone (ALDACTONE) 50 MG tablet Take 50 mg by mouth daily.      No current facility-administered medications on file prior to visit.     PAST MEDICAL HISTORY: Past Medical History:  Diagnosis Date  . Allergic rhinitis   . Anxiety   . Arthritis    left foot  . Asthma   . Back pain   . Colon polyps    adenomatous  . Constipation   . Diverticulosis   . Dysrhythmia 1999   Dr Johnsie Cancel 2003  . Elevated LFTs   . Fatty liver disease, nonalcoholic   . GERD (gastroesophageal reflux disease)   . Headache(784.0)    migraines with vision changes but no pain  . Heart murmur   . Hypertension   . IBS (irritable bowel syndrome)   . Joint pain   . Migraines   . Muscle cramps   .  Obesity   . Palpitations   . Pneumonia 2007  . Renal cell carcinoma ([place]) 2013   minimal partial nephrectomy  . Restless leg syndrome   . Sleep apnea    moderate; wears CPAP, uncertain about setting  . TIA (transient ischemic attack) 09/11/2014  . Ulcer   . Urinary incontinence   . Vitamin D deficiency     PAST SURGICAL HISTORY: Past Surgical History:  Procedure Laterality Date  . BREAST REDUCTION SURGERY  Jan 1991  . ELBOW SURGERY Left   . KIDNEY SURGERY Right October 2013    Tumor removed   . LAPAROSCOPIC APPENDECTOMY N/A 06/08/2018   Procedure: APPENDECTOMY LAPAROSCOPIC;  Surgeon: Jovita Kussmaul, MD;  Location: [facility];  Service: General;  Laterality: N/A;  . TONSILLECTOMY    . VAGINAL HYSTERECTOMY  1979   partial    SOCIAL HISTORY: Social History   Tobacco  Use  . Smoking status: Never Smoker  . Smokeless tobacco: Never Used  Substance Use Topics  . Alcohol use: Yes    Alcohol/week: 2.0 - 3.0 standard drinks    Types: 1 - 2 Glasses of wine, 1 Shots of liquor per week  . Drug use: No    FAMILY HISTORY: Family History  Problem Relation Age of Onset  . Heart disease Father 53  . Lung cancer Father        smoker  . Hypertension Mother   . Anxiety disorder Mother   . Heart disease Brother   . Hypertension Other   . Colon cancer Cousin   . Allergies Daughter   . Allergies Brother   . Melanoma Daughter   . Lung cancer Sister   . Liver disease Neg Hx   . Asthma Neg Hx   . Esophageal cancer Neg Hx   . Rectal cancer Neg Hx   . Stomach cancer Neg Hx    ROS: Review of Systems  Respiratory: Negative for shortness of breath.   Cardiovascular: Negative for chest pain.   PHYSICAL EXAM: Pt in no acute distress  RECENT LABS AND TESTS: BMET    Component Value Date/Time   NA 141 09/03/2018 1542   K 5.1 09/03/2018 1542   CL 101 09/03/2018 1542   CO2 23 09/03/2018 1542   GLUCOSE 85 09/03/2018 1542   GLUCOSE 135 (H) 06/09/2018 0456   BUN 22 09/03/2018 1542    CREATININE 0.81 09/03/2018 1542   CALCIUM 11.1 (H) 09/03/2018 1542   GFRNONAA 74 09/03/2018 1542   GFRAA 85 09/03/2018 1542   Lab Results  Component Value Date   HGBA1C 5.1 09/03/2018   HGBA1C 5.2 04/01/2018   HGBA1C 5.4 08/25/2017   HGBA1C 5.4 12/16/2011   HGBA1C 5.7 05/03/2011   Lab Results  Component Value Date   INSULIN 5.7 09/03/2018   INSULIN 5.6 04/01/2018   INSULIN 50.3 (H) 08/25/2017   CBC    Component Value Date/Time   WBC 8.1 06/09/2018 0456   RBC 3.71 (L) 06/09/2018 0456   HGB 11.6 (L) 06/09/2018 0456   HGB 12.7 08/25/2017 1047   HCT 36.6 06/09/2018 0456   HCT 37.0 08/25/2017 1047   PLT 193 06/09/2018 0456   MCV 98.7 06/09/2018 0456   MCV 93 08/25/2017 1047   MCH 31.3 06/09/2018 0456   MCHC 31.7 06/09/2018 0456   RDW 13.3 06/09/2018 0456   RDW 13.2 08/25/2017 1047   LYMPHSABS 1.4 11/06/2017 1726   LYMPHSABS 1.2 08/25/2017 1047   MONOABS 0.6 11/06/2017 1726   EOSABS 0.1 11/06/2017 1726   EOSABS 0.1 08/25/2017 1047   BASOSABS 0.0 11/06/2017 1726   BASOSABS 0.0 08/25/2017 1047   Iron/TIBC/Ferritin/ %Sat No results found for: IRON, TIBC, FERRITIN, IRONPCTSAT Lipid Panel     Component Value Date/Time   CHOL 176 08/25/2017 1047   TRIG 91 08/25/2017 1047   HDL 84 08/25/2017 1047   CHOLHDL 3 08/24/2010 1134   VLDL 17.0 08/24/2010 1134   LDLCALC 74 08/25/2017 1047   Hepatic Function Panel     Component Value Date/Time   PROT 7.1 09/03/2018 1542   ALBUMIN 4.8 09/03/2018 1542   AST 17 09/03/2018 1542   ALT 16 09/03/2018 1542   ALKPHOS 55 09/03/2018 1542   BILITOT 0.4 09/03/2018 1542   BILIDIR 0.2 08/24/2010 1134      Component Value Date/Time   TSH 1.900 08/25/2017 1047   TSH 1.12 05/03/2011 1239   TSH 1.34  08/24/2010 1134   Results for NAKARI, BRACKNELL "[name]" (MRN [id number]) as of 01/12/2019 16:30  Ref. Range 09/03/2018 15:42  Vitamin D, 25-Hydroxy Latest Ref Range: 30.0 - 100.0 ng/mL 63.3    I, Michaelene Song, am acting as Location manager  for [employer], DO  I have reviewed the above documentation for accuracy and completeness, and I agree with the above. -Jearld Lesch, DO

## 2019-01-19 ENCOUNTER — Telehealth: Payer: Self-pay | Admitting: Internal Medicine

## 2019-01-19 ENCOUNTER — Telehealth: Payer: Self-pay | Admitting: Neurology

## 2019-01-19 DIAGNOSIS — H6690 Otitis media, unspecified, unspecified ear: Secondary | ICD-10-CM | POA: Diagnosis not present

## 2019-01-19 DIAGNOSIS — J019 Acute sinusitis, unspecified: Secondary | ICD-10-CM | POA: Diagnosis not present

## 2019-01-19 NOTE — Telephone Encounter (Signed)
Sent mychart message asking patient to schedule an office visit (or phone visit in July) at which point I can refill her medication

## 2019-01-19 NOTE — Telephone Encounter (Signed)
Called the patient back asking if she has reached out to the PCP in regards to her concern. Patient wears a CPAP at night with full face mask. The patient states that she attempted to contact the PCP yesterday and she had not heard from them. Pt states that she has taken allergy meds which they are happen. She feels like her glands are a bit swollen and she has pain in the right ear. [facility] Pt describes sore throat. She has reached out to the PCP twice and they are directing her to the different places to get COVID testing. I have advised unfortunately we aren't providing the test, Patient states the purpose for the call was with all this going on she had a follow up for [facility] and she didn't want to come in until she got to the bottom of all this. We will cancel this apt and push out to later date to allow time for testing. Aug 4 at 1 pm is follow up. At this visit patient would like to document compliance and order a new machine. She is established through [employer].

## 2019-01-19 NOTE — Telephone Encounter (Signed)
Pt called in and stated she is having sinus drainage and a sore throat and she wants to know if she can do a video visit. Please advise

## 2019-01-21 ENCOUNTER — Ambulatory Visit: Payer: Medicare Other | Admitting: Nurse Practitioner

## 2019-01-21 ENCOUNTER — Ambulatory Visit: Payer: Medicare Other | Admitting: Neurology

## 2019-01-26 ENCOUNTER — Telehealth: Payer: Self-pay | Admitting: Internal Medicine

## 2019-01-26 ENCOUNTER — Telehealth (INDEPENDENT_AMBULATORY_CARE_PROVIDER_SITE_OTHER): Payer: Medicare Other | Admitting: Bariatrics

## 2019-01-26 ENCOUNTER — Encounter (INDEPENDENT_AMBULATORY_CARE_PROVIDER_SITE_OTHER): Payer: Self-pay | Admitting: Bariatrics

## 2019-01-26 ENCOUNTER — Other Ambulatory Visit: Payer: Self-pay

## 2019-01-26 DIAGNOSIS — I10 Essential (primary) hypertension: Secondary | ICD-10-CM | POA: Diagnosis not present

## 2019-01-26 DIAGNOSIS — E669 Obesity, unspecified: Secondary | ICD-10-CM | POA: Diagnosis not present

## 2019-01-26 DIAGNOSIS — Z6834 Body mass index (BMI) 34.0-34.9, adult: Secondary | ICD-10-CM | POA: Diagnosis not present

## 2019-01-26 DIAGNOSIS — E8881 Metabolic syndrome: Secondary | ICD-10-CM | POA: Diagnosis not present

## 2019-01-26 NOTE — Telephone Encounter (Signed)
See phone note. Not sure if you wanted them squeezed in somewhere.  Please advise.

## 2019-01-26 NOTE — Telephone Encounter (Signed)
Patient is aware of both appts.

## 2019-01-26 NOTE — Telephone Encounter (Signed)
Patient called to schedule and appt for med refill for her and husband but she would like to do only a video call appt. Due to Dr. Henrene Pastor schedule he no long has any more opening in July for Telephone. She does not want to come to the office for a visit due to [place]. Please advise

## 2019-01-26 NOTE — Telephone Encounter (Signed)
ADD-ons:   12:00 noon (her) and 12:15 PM (him) this Thursday January 28, 2019

## 2019-01-26 NOTE — Telephone Encounter (Signed)
Lm on vm 

## 2019-01-27 ENCOUNTER — Telehealth: Payer: Self-pay

## 2019-01-27 NOTE — Telephone Encounter (Signed)
Phone screening complete 

## 2019-01-27 NOTE — Progress Notes (Signed)
Office: [phone]  /  Fax: [phone] TeleHealth Visit:  Jamie [name] has verbally consented to this TeleHealth visit today. The patient is located at home, the provider is located at the [facility] office. The participants in this visit include the listed provider and patient. The visit was conducted today via face time.  HPI:   Chief Complaint: OBESITY Jamie [name] is here to discuss her progress with her obesity treatment plan. She is on the Category 2 plan and is following her eating plan approximately 50 % of the time. She states she is exercising 0 minutes 0 times per week. Jamie [name] states that her weight is the same. She currently has a sinus infection and is taking an antibiotic (taking Amoxicillin for 10 days). Her her weight is 164 lbs. She is doing adequate water.  We were unable to weigh the patient today for this TeleHealth visit. She feels as if she has maintained her weight since her last visit. She has lost 68 lbs since starting treatment with [name].  Insulin Resistance Jamie [name] has a diagnosis of insulin resistance based on her elevated fasting insulin level >5. Although Jamie [name] blood glucose readings are still under good control, insulin resistance puts her at greater risk of metabolic syndrome and diabetes. She is taking metformin currently and continues to work on diet and exercise to decrease risk of diabetes. She denies polyphagia.  Hypertension Jamie [name] is a 73 y.o. female with hypertension. Challis's blood pressure is well controlled. She is taking spironolactone and denies chest pain. She is working on weight loss to help control her blood pressure with the goal of decreasing her risk of heart attack and stroke.   ASSESSMENT AND PLAN:  Insulin resistance  Essential hypertension  Class 1 obesity with serious comorbidity and body mass index (BMI) of 34.0 to 34.9 in adult, unspecified obesity type  PLAN:  Insulin Resistance Jamie [name] will continue to  work on weight loss, exercise, and decreasing simple carbohydrates in her diet to help decrease the risk of diabetes. We dicussed metformin including benefits and risks. She was informed that eating too many simple carbohydrates or too many calories at one sitting increases the likelihood of GI side effects. Jamie [name] agrees to continue taking metformin, and she agrees to follow up with our clinic in 2 weeks as directed to monitor her progress.  Hypertension We discussed sodium restriction, working on healthy weight loss, and a regular exercise program as the means to achieve improved blood pressure control. Jamie [name] agreed with this plan and agreed to follow up as directed. We will continue to monitor her blood pressure as well as her progress with the above lifestyle modifications. Jamie [name] agrees to continue her medications and will watch for signs of hypotension as she continues her lifestyle modifications. Jamie [name] agrees to follow up with our clinic in 2 weeks.  Obesity Jamie [name] is currently in the action stage of change. As such, her goal is to continue with weight loss efforts She has agreed to follow the Category 2 plan  Jamie [name] will get back on track. Jamie [name] has been instructed to work up to a goal of 150 minutes of combined cardio and strengthening exercise per week or she will begin the treadmill at the house for weight loss and overall health benefits. We discussed the following Behavioral Modification Strategies today: increasing lean protein intake, decreasing simple carbohydrates, increasing vegetables, decrease eating out, increase H20 intake, work on meal planning and easy cooking plans, no skipping meals, and keeping  healthy foods in the home   Jamie [name] has agreed to follow up with our clinic in 2 weeks. She was informed of the importance of frequent follow up visits to maximize her success with intensive lifestyle modifications for her multiple health conditions.  ALLERGIES: Allergies  Allergen  Reactions  . Buprenorphine Hcl Rash and Shortness Of Breath  . Morphine And Related Shortness Of Breath and Rash  . Morphine And Related Shortness Of Breath  . Aspirin Other (See Comments)    REACTION: ulcers  Can take 81 mg  . Avelox [Moxifloxacin Hcl In Nacl] Hives  . Diflucan [Fluconazole] Hives    Whelps and rash c skin peeling  . Diflucan [Fluconazole] Hives  . Latex Rash  . Moxifloxacin Palpitations    Tachycardia     MEDICATIONS: Current Outpatient Medications on File Prior to Visit  Medication Sig Dispense Refill  . Ascorbic Acid (VITAMIN C) 1000 MG tablet Take 1,000 mg by mouth daily.     Marland Kitchen aspirin EC 81 MG tablet Take 1 tablet (81 mg total) by mouth daily. 90 tablet 3  . BIOTIN PO Take 1 capsule by mouth daily.     . Cholecalciferol (VITAMIN D3) 2000 units TABS Take 2,000 Units by mouth daily.     . clidinium-chlordiazePOXIDE (LIBRAX) 5-2.5 MG capsule Take 1 capsule by mouth 2 (two) times daily. 180 capsule 3  . clonazePAM (KLONOPIN) 1 MG tablet Take 0.5 mg by mouth daily as needed (restless leg syndrome).     . conjugated estrogens (PREMARIN) vaginal cream Place 1 Applicatorful vaginally every other day.     . diphenhydrAMINE (BENADRYL) 25 MG tablet Take 25 mg by mouth daily as needed for allergies.     Marland Kitchen esomeprazole (NEXIUM) 40 MG capsule TAKE 1 CAPSULE TWICE A DAY BEFORE MEALS 180 capsule 0  . linaclotide (LINZESS) 145 MCG CAPS capsule Take 1 capsule (145 mcg total) by mouth daily. Patient needs office visit for further refills 90 capsule 1  . metaxalone (SKELAXIN) 800 MG tablet Take 400 mg by mouth daily as needed for muscle spasms.    . metFORMIN (GLUCOPHAGE) 500 MG tablet Take 1 tablet (500 mg total) by mouth daily with breakfast. 60 tablet 0  . Omega-3-6-9 CAPS Take 1 capsule by mouth every evening.    . Prenatal Vit-Fe Fumarate-FA (PRENATAL MULTIVITAMIN) TABS tablet Take 1 tablet by mouth every evening.     . Probiotic Product (PROBIOTIC-10 PO) Take 1 capsule by  mouth 2 (two) times daily.     Marland Kitchen spironolactone (ALDACTONE) 50 MG tablet Take 50 mg by mouth daily.      No current facility-administered medications on file prior to visit.     PAST MEDICAL HISTORY: Past Medical History:  Diagnosis Date  . Allergic rhinitis   . Anxiety   . Arthritis    left foot  . Asthma   . Back pain   . Colon polyps    adenomatous  . Constipation   . Diverticulosis   . Dysrhythmia 1999   Dr Johnsie Cancel 2003  . Elevated LFTs   . Fatty liver disease, nonalcoholic   . GERD (gastroesophageal reflux disease)   . Headache(784.0)    migraines with vision changes but no pain  . Heart murmur   . Hypertension   . IBS (irritable bowel syndrome)   . Joint pain   . Migraines   . Muscle cramps   . Obesity   . Palpitations   . Pneumonia 2007  . Renal cell  carcinoma ([place]) 2013   minimal partial nephrectomy  . Restless leg syndrome   . Sleep apnea    moderate; wears CPAP, uncertain about setting  . TIA (transient ischemic attack) 09/11/2014  . Ulcer   . Urinary incontinence   . Vitamin D deficiency     PAST SURGICAL HISTORY: Past Surgical History:  Procedure Laterality Date  . BREAST REDUCTION SURGERY  Jan 1991  . ELBOW SURGERY Left   . KIDNEY SURGERY Right October 2013    Tumor removed   . LAPAROSCOPIC APPENDECTOMY N/A 06/08/2018   Procedure: APPENDECTOMY LAPAROSCOPIC;  Surgeon: Jovita Kussmaul, MD;  Location: [facility];  Service: General;  Laterality: N/A;  . TONSILLECTOMY    . VAGINAL HYSTERECTOMY  1979   partial    SOCIAL HISTORY: Social History   Tobacco Use  . Smoking status: Never Smoker  . Smokeless tobacco: Never Used  Substance Use Topics  . Alcohol use: Yes    Alcohol/week: 2.0 - 3.0 standard drinks    Types: 1 - 2 Glasses of wine, 1 Shots of liquor per week  . Drug use: No    FAMILY HISTORY: Family History  Problem Relation Age of Onset  . Heart disease Father 6  . Lung cancer Father        smoker  . Hypertension Mother   . Anxiety  disorder Mother   . Heart disease Brother   . Hypertension Other   . Colon cancer Cousin   . Allergies Daughter   . Allergies Brother   . Melanoma Daughter   . Lung cancer Sister   . Liver disease Neg Hx   . Asthma Neg Hx   . Esophageal cancer Neg Hx   . Rectal cancer Neg Hx   . Stomach cancer Neg Hx     ROS: Review of Systems  Constitutional: Negative for weight loss.  Cardiovascular: Negative for chest pain.  Endo/Heme/Allergies:       Negative polyphagia    PHYSICAL EXAM: Pt in no acute distress  RECENT LABS AND TESTS: BMET    Component Value Date/Time   NA 141 09/03/2018 1542   K 5.1 09/03/2018 1542   CL 101 09/03/2018 1542   CO2 23 09/03/2018 1542   GLUCOSE 85 09/03/2018 1542   GLUCOSE 135 (H) 06/09/2018 0456   BUN 22 09/03/2018 1542   CREATININE 0.81 09/03/2018 1542   CALCIUM 11.1 (H) 09/03/2018 1542   GFRNONAA 74 09/03/2018 1542   GFRAA 85 09/03/2018 1542   Lab Results  Component Value Date   HGBA1C 5.1 09/03/2018   HGBA1C 5.2 04/01/2018   HGBA1C 5.4 08/25/2017   HGBA1C 5.4 12/16/2011   HGBA1C 5.7 05/03/2011   Lab Results  Component Value Date   INSULIN 5.7 09/03/2018   INSULIN 5.6 04/01/2018   INSULIN 50.3 (H) 08/25/2017   CBC    Component Value Date/Time   WBC 8.1 06/09/2018 0456   RBC 3.71 (L) 06/09/2018 0456   HGB 11.6 (L) 06/09/2018 0456   HGB 12.7 08/25/2017 1047   HCT 36.6 06/09/2018 0456   HCT 37.0 08/25/2017 1047   PLT 193 06/09/2018 0456   MCV 98.7 06/09/2018 0456   MCV 93 08/25/2017 1047   MCH 31.3 06/09/2018 0456   MCHC 31.7 06/09/2018 0456   RDW 13.3 06/09/2018 0456   RDW 13.2 08/25/2017 1047   LYMPHSABS 1.4 11/06/2017 1726   LYMPHSABS 1.2 08/25/2017 1047   MONOABS 0.6 11/06/2017 1726   EOSABS 0.1 11/06/2017 1726   EOSABS  0.1 08/25/2017 1047   BASOSABS 0.0 11/06/2017 1726   BASOSABS 0.0 08/25/2017 1047   Iron/TIBC/Ferritin/ %Sat No results found for: IRON, TIBC, FERRITIN, IRONPCTSAT Lipid Panel     Component Value  Date/Time   CHOL 176 08/25/2017 1047   TRIG 91 08/25/2017 1047   HDL 84 08/25/2017 1047   CHOLHDL 3 08/24/2010 1134   VLDL 17.0 08/24/2010 1134   LDLCALC 74 08/25/2017 1047   Hepatic Function Panel     Component Value Date/Time   PROT 7.1 09/03/2018 1542   ALBUMIN 4.8 09/03/2018 1542   AST 17 09/03/2018 1542   ALT 16 09/03/2018 1542   ALKPHOS 55 09/03/2018 1542   BILITOT 0.4 09/03/2018 1542   BILIDIR 0.2 08/24/2010 1134      Component Value Date/Time   TSH 1.900 08/25/2017 1047   TSH 1.12 05/03/2011 1239   TSH 1.34 08/24/2010 1134      I, Trixie Dredge, am acting as Location manager for [employer], DO  I have reviewed the above documentation for accuracy and completeness, and I agree with the above. Jearld Lesch, DO

## 2019-01-27 NOTE — Telephone Encounter (Signed)
Patient and her husband will have phone calls with Dr. Henrene Pastor tomorrow.

## 2019-01-28 ENCOUNTER — Encounter: Payer: Self-pay | Admitting: Internal Medicine

## 2019-01-28 ENCOUNTER — Ambulatory Visit (INDEPENDENT_AMBULATORY_CARE_PROVIDER_SITE_OTHER): Payer: Medicare Other | Admitting: Internal Medicine

## 2019-01-28 VITALS — BP 107/75 | HR 69 | Ht 63.0 in | Wt 162.0 lb

## 2019-01-28 DIAGNOSIS — K589 Irritable bowel syndrome without diarrhea: Secondary | ICD-10-CM

## 2019-01-28 DIAGNOSIS — K760 Fatty (change of) liver, not elsewhere classified: Secondary | ICD-10-CM | POA: Diagnosis not present

## 2019-01-28 DIAGNOSIS — K219 Gastro-esophageal reflux disease without esophagitis: Secondary | ICD-10-CM | POA: Diagnosis not present

## 2019-01-28 MED ORDER — CILIDINIUM-CHLORDIAZEPOXIDE 2.5-5 MG PO CAPS: 1.0000 | ORAL_CAPSULE | Freq: Two times a day (BID) | ORAL | 3 refills | Status: DC

## 2019-01-28 MED ORDER — LINACLOTIDE 145 MCG PO CAPS: 145.0000 ug | ORAL_CAPSULE | Freq: Every day | ORAL | 3 refills | Status: DC

## 2019-01-28 MED ORDER — ESOMEPRAZOLE MAGNESIUM 40 MG PO CPDR: DELAYED_RELEASE_CAPSULE | ORAL | 3 refills | Status: DC

## 2019-01-28 NOTE — Patient Instructions (Addendum)
If you are age 71 or older, your body mass index should be between 23-30. Your Body mass index is 28.7 kg/m. If this is out of the aforementioned range listed, please consider follow up with your Primary Care Provider.  If you are age 63 or younger, your body mass index should be between 19-25. Your Body mass index is 28.7 kg/m. If this is out of the aformentioned range listed, please consider follow up with your Primary Care Provider.   We have sent your refill medication to [facility] Librax Esomeprazole  Continue good work with exercise weight reduction. Due for surveillance colonoscopy around July 2021  Routine GI office follow-up 1 year.  Sooner if needed

## 2019-01-28 NOTE — Addendum Note (Signed)
Addended by: Lanny Hurst A on: 01/28/2019 03:41 PM   Modules accepted: Orders

## 2019-01-28 NOTE — Progress Notes (Signed)
HISTORY OF PRESENT ILLNESS:  Jamie [name] is a 72 y.o. female who is followed in this office for GERD, fatty liver, IBS, and adenomatous colon polyps.  She also has a history of diverticulitis requiring hospitalization in the past.  She was last evaluated in this office September 22, 2017.  Dictation for details.  At that time she was doing well on her medical regimen for her GI problems.  She schedules this telehealth medicine visit during the coronavirus pandemic.  She reports to me that her reflux symptoms remain well controlled with Nexium 40 mg twice daily.  She continues to use Librax twice daily and Linzess 145 mcg daily for her IBS.  This is working nicely.  She is up-to-date on her surveillance colonoscopy.  She has been diligent on her weight reduction program and since her last visit has lost an additional 60 pounds.  Liver tests from February 2020 have normalized.  AST 17 and ALT 16.  She has no active GI complaints at this time.  She does however require medication refills.  REVIEW OF SYSTEMS:  All non-GI ROS negative unless otherwise stated in the HPI except for seasonal allergy Past Medical History:  Diagnosis Date  . Allergic rhinitis   . Anxiety   . Arthritis    left foot  . Asthma   . Back pain   . Colon polyps    adenomatous  . Constipation   . Diverticulosis   . Dysrhythmia 1999   Dr Johnsie Cancel 2003  . Elevated LFTs   . Fatty liver disease, nonalcoholic   . GERD (gastroesophageal reflux disease)   . Headache(784.0)    migraines with vision changes but no pain  . Heart murmur   . Hypertension   . IBS (irritable bowel syndrome)   . Joint pain   . Migraines   . Muscle cramps   . Obesity   . Palpitations   . Pneumonia 2007  . Renal cell carcinoma ([place]) 2013   minimal partial nephrectomy  . Restless leg syndrome   . Sleep apnea    moderate; wears CPAP, uncertain about setting  . TIA (transient ischemic attack) 09/11/2014  . Ulcer   . Urinary incontinence   .  Vitamin D deficiency     Past Surgical History:  Procedure Laterality Date  . BREAST REDUCTION SURGERY  Jan 1991  . ELBOW SURGERY Left   . KIDNEY SURGERY Right October 2013    Tumor removed   . LAPAROSCOPIC APPENDECTOMY N/A 06/08/2018   Procedure: APPENDECTOMY LAPAROSCOPIC;  Surgeon: Jovita Kussmaul, MD;  Location: [facility];  Service: General;  Laterality: N/A;  . TONSILLECTOMY    . VAGINAL HYSTERECTOMY  1979   partial    Social History Jamie [name]  reports that she has never smoked. She has never used smokeless tobacco. She reports current alcohol use of about 2.0 - 3.0 standard drinks of alcohol per week. She reports that she does not use drugs.  family history includes Allergies in her brother and daughter; Anxiety disorder in her mother; Colon cancer in her cousin; Heart disease in her brother; Heart disease (age of onset: 89) in her father; Hypertension in her mother and another family member; Lung cancer in her father and sister; Melanoma in her daughter.  Allergies  Allergen Reactions  . Buprenorphine Hcl Rash and Shortness Of Breath  . Morphine And Related Shortness Of Breath and Rash  . Morphine And Related Shortness Of Breath  . Aspirin Other (See Comments)  REACTION: ulcers  Can take 81 mg  . Avelox [Moxifloxacin Hcl In Nacl] Hives  . Diflucan [Fluconazole] Hives    Whelps and rash c skin peeling  . Diflucan [Fluconazole] Hives  . Latex Rash  . Moxifloxacin Palpitations    Tachycardia        PHYSICAL EXAMINATION: No physical examination with telehealth medicine visit   ASSESSMENT:  1.  GERD.  Symptoms controlled with Nexium 40 mg twice daily 2.  IBS.  Currently managed with Librax and Linzess 3.  History of fatty liver.  Improved with weight reduction 4.  Obesity.  Markedly improved with weight reduction program 5.  History of diverticulitis requiring hospitalization 6.  History of adenomatous colon polyps.  Surveillance up-to-date   PLAN:  1.   REFILL Nexium 40 mg twice daily.  1 year refills 2.  Refill Librax.  1 year refills 3.  Refill Linzess 145 mcg daily.  1 year refills 4.  Continue good work with exercise weight reduction 5.  Due for surveillance colonoscopy around July 2021 6.  Routine GI office follow-up 1 year.  Sooner if needed This telehealth audio only visit was initiated by and consented for by the patient who was in her home while I was in my office.  She understands her may be associated professional charge for this service totaling 15 minutes

## 2019-02-09 ENCOUNTER — Ambulatory Visit (INDEPENDENT_AMBULATORY_CARE_PROVIDER_SITE_OTHER): Payer: Medicare Other | Admitting: Family Medicine

## 2019-02-09 ENCOUNTER — Other Ambulatory Visit: Payer: Self-pay

## 2019-02-09 ENCOUNTER — Telehealth (INDEPENDENT_AMBULATORY_CARE_PROVIDER_SITE_OTHER): Payer: Medicare Other | Admitting: Bariatrics

## 2019-02-09 ENCOUNTER — Encounter: Payer: Self-pay | Admitting: Family Medicine

## 2019-02-09 VITALS — BP 121/79 | HR 85 | Temp 98.0°F | Ht 63.0 in | Wt 183.0 lb

## 2019-02-09 DIAGNOSIS — Z6834 Body mass index (BMI) 34.0-34.9, adult: Secondary | ICD-10-CM | POA: Diagnosis not present

## 2019-02-09 DIAGNOSIS — E559 Vitamin D deficiency, unspecified: Secondary | ICD-10-CM | POA: Diagnosis not present

## 2019-02-09 DIAGNOSIS — G4733 Obstructive sleep apnea (adult) (pediatric): Secondary | ICD-10-CM

## 2019-02-09 DIAGNOSIS — E669 Obesity, unspecified: Secondary | ICD-10-CM

## 2019-02-09 DIAGNOSIS — Z9989 Dependence on other enabling machines and devices: Secondary | ICD-10-CM | POA: Diagnosis not present

## 2019-02-09 DIAGNOSIS — E119 Type 2 diabetes mellitus without complications: Secondary | ICD-10-CM | POA: Diagnosis not present

## 2019-02-09 NOTE — Patient Instructions (Signed)
We will send order for new CPAP machine  Continue using CPAP nightly and for greater than 4 hours each night  Follow up in 1 year  Sleep Apnea Sleep apnea affects breathing during sleep. It causes breathing to stop for a short time or to become shallow. It can also increase the risk of:  Heart attack.  Stroke.  Being very overweight (obese).  Diabetes.  Heart failure.  Irregular heartbeat. The goal of treatment is to help you breathe normally again. What are the causes? There are three kinds of sleep apnea:  Obstructive sleep apnea. This is caused by a blocked or collapsed airway.  Central sleep apnea. This happens when the brain does not send the right signals to the muscles that control breathing.  Mixed sleep apnea. This is a combination of obstructive and central sleep apnea. The most common cause of this condition is a collapsed or blocked airway. This can happen if:  Your throat muscles are too relaxed.  Your tongue and tonsils are too large.  You are overweight.  Your airway is too small. What increases the risk?  Being overweight.  Smoking.  Having a small airway.  Being older.  Being female.  Drinking alcohol.  Taking medicines to calm yourself (sedatives or tranquilizers).  Having family members with the condition. What are the signs or symptoms?  Trouble staying asleep.  Being sleepy or tired during the day.  Getting angry a lot.  Loud snoring.  Headaches in the morning.  Not being able to focus your mind (concentrate).  Forgetting things.  Less interest in sex.  Mood swings.  Personality changes.  Feelings of sadness (depression).  Waking up a lot during the night to pee (urinate).  Dry mouth.  Sore throat. How is this diagnosed?  Your medical history.  A physical exam.  A test that is done when you are sleeping (sleep study). The test is most often done in a sleep lab but may also be done at home. How is this  treated?   Sleeping on your side.  Using a medicine to get rid of mucus in your nose (decongestant).  Avoiding the use of alcohol, medicines to help you relax, or certain pain medicines (narcotics).  Losing weight, if needed.  Changing your diet.  Not smoking.  Using a machine to open your airway while you sleep, such as: ? An oral appliance. This is a mouthpiece that shifts your lower jaw forward. ? A CPAP device. This device blows air through a mask when you breathe out (exhale). ? An EPAP device. This has valves that you put in each nostril. ? A BPAP device. This device blows air through a mask when you breathe in (inhale) and breathe out.  Having surgery if other treatments do not work. It is important to get treatment for sleep apnea. Without treatment, it can lead to:  High blood pressure.  Coronary artery disease.  In men, not being able to have an erection (impotence).  Reduced thinking ability. Follow these instructions at home: Lifestyle  Make changes that your doctor recommends.  Eat a healthy diet.  Lose weight if needed.  Avoid alcohol, medicines to help you relax, and some pain medicines.  Do not use any products that contain nicotine or tobacco, such as cigarettes, e-cigarettes, and chewing tobacco. If you need help quitting, ask your doctor. General instructions  Take over-the-counter and prescription medicines only as told by your doctor.  If you were given a machine to use  while you sleep, use it only as told by your doctor.  If you are having surgery, make sure to tell your doctor you have sleep apnea. You may need to bring your device with you.  Keep all follow-up visits as told by your doctor. This is important. Contact a doctor if:  The machine that you were given to use during sleep bothers you or does not seem to be working.  You do not get better.  You get worse. Get help right away if:  Your chest hurts.  You have trouble  breathing in enough air.  You have an uncomfortable feeling in your back, arms, or stomach.  You have trouble talking.  One side of your body feels weak.  A part of your face is hanging down. These symptoms may be an emergency. Do not wait to see if the symptoms will go away. Get medical help right away. Call your local emergency services (911 in the [place] Do not drive yourself to the hospital. Summary  This condition affects breathing during sleep.  The most common cause is a collapsed or blocked airway.  The goal of treatment is to help you breathe normally while you sleep. This information is not intended to replace advice given to you by your health care provider. Make sure you discuss any questions you have with your health care provider. Document Released: 04/02/2008 Document Revised: 04/10/2018 Document Reviewed: 02/17/2018 Elsevier Patient Education  2020 [employer].

## 2019-02-09 NOTE — Progress Notes (Signed)
PATIENT: Jamie [name] DOB: 1945-12-29  REASON FOR VISIT: follow up HISTORY FROM: patient  Chief Complaint  Patient presents with  . Follow-up    Yearly f/u. Alone. Rm 1. Patient mentioned that she has been issues with her machine. She would like to discuss getting a new machine.      HISTORY OF PRESENT ILLNESS: Today 02/09/19 Pansy D Beel is a 73 y.o. female here today for follow up for OSA on CPAP. She is doing well with compliance. She is concerned that her machine is not working correctly. She reports that when she tries to power up the machine, it may take 2-3 tries.  She is concerned that eventually machine will completely give out.  Last sleep study was in 2012.  She feels that her CPAP is approximately that old.  She is using CPAP nightly.  She reports that she cannot sleep without it.    HISTORY: (copied from [employer] note on 01/19/2018)  (CD)Is and is here today to be evaluated for a headache, and she reports that she first had migrainous headaches when she resumed birth control pills over 40 years ago. The headaches were very debilitating at the time but there were infrequent through the 1970s. Once she discontinued hormonal contraception her headaches became as infrequent as once a year. In the early 1980s the couple lived in [place] and Mrs. Raisch recalls that the bright light was a trigger for headaches -there was a strong photophobic component, and she made many trips to the local hospital for Demerol shots. She underwent a self hypnosis treatment and this worked well, controlled the migraine for years. On 9-20 02-2013 the patient woke up with a severe explosive kind of pain that seemed to arise from the right eye. The pain was centered retro-orbital and radiated backwards into the center of the head. The patient tried aspirin and Motrin with no benefit at the time as my colleague Dr. Janann Colonel D.O. did document.  This was different from her typical migraines. She  was extremely nauseated but she could not vomit and developed dry heaves. The headache broker out of sound sleep at about about 3 AM. She had no tearing of the eye ( that she could remember), the nose was not runny and her face was not flushe.She was asleep with her CPAP.  The patient had an eye exam in 2013 which was reportedly normal at that time and her headaches were evaluated with a brain MRI which was also reviewed and found unremarkable. Not had a number of the severe headache spell since. On 09-12-14 the patient again developed a headache this time of a new quality. She was watching TV at about 11 PM she noticed that the right vision was fluctuating she states that her vision was disturbed by changing in light and dark sensation, that she describedas if it checker board was placed in front of her eyes. By 11.45 , she tried to answer an e mail, couldn't decipher the message, non-sensical to her. She switched and tried to read aloud, she couldn't , her speech output was not correlated to the text she read. Jibberish, while her normal speech was unaffected. On the left side this time by migraines have always attack the right side of her head. This was unusual as well. The entire episode lasted less than one hour. The emergency room obtained a CT head , non contrast, read as normal and reviewed here in imaging file. Her BP was very high.  She sleeps with a CPAP, and likes it very much, she had nocturia before being diagnosed with OSA.  Her pneumonia related hospitalization in 2007 was the cause for the test by [facility], as her RN had witnessed her sleep apnea. Dr .Elsworth Soho follows her, Dr Constance Holster is her ENT.   Interval history from 03/27/2017CD. Mrs. Hasley is doing very well she has had one of the severe headaches she used to describe in the past, -onset at about 5:30 after working on the computer. She developed a severe right-sided headache. Afraid this could be another TIA I have recommended her to go to  the emergency room, she was given morphine for the headaches which then caused another reaction and side effects. She did not have any speech arrest with this one. The emergency room did not find anything abnormal, she underwent a CT scan of the brain, which was normal, MRI brain was normal. I'm also able today to just review a download of her CPAP machine which has been followed by Dr. Shelia Media but not in the last 3 years. She has 100% compliance for days 100% compliance for over 4 hours of daily use, average user time is 9 hours 7 minutes set pressure is 12 cm water no EPR is used residual AHI is 0.8. Excellent result. Her obesity was the same. No weight loss.  She was given a prescrition of Fioricet and ondansetron also known as Zofran. She's not supposed to take the medication before the headache component has started.  UPDATE 03/14/2018CM Ms. Khatib, 72 year old female returns for follow-up.She has not had further stroke or TIA episodes. She denies any recent falls  She had a couple episodes of blurred vision without headache. She has not had any speech arrest, weakness on one side or the other etc. She continues to use her CPAP every night, however she took that she about 2 weeks ago and we are unable to get a download. She remains on enteric-coated aspirin. She continues to get little exercise due to her chronic vertigo. Her transcranial Dopplers showed minimal PFO not likely to be clinically significant. She has been evaluated at [facility] for her chronic vertigo. Her husband reports several instances since last seen when she fell asleep at the computer without her CPAP and stopped breathing. Once he got oxygen to her she woke up and was fine.  She returns for reevaluation.  UPDATE 06/14/2018CM Ms. Giammona, 73 year old female returns for follow-up she has a history of TIA and is currently on aspirin for secondary stroke prevention. Blood pressure in the office today 105/65. She continues to take at  home and generally in the 962 systolic range. Patient was seen in the emergency room on 11/07/2016 for headache in the left unilateral region the pain did not radiate no nausea and vomiting. She had some visual blurring and has a history of similar headaches She was given Reglan and Benadryl. She is having maybe 2 headaches not enough for a preventive medication. She says prescription strength Motrin takes the headache away worst part is the nausea for which she has nothing to take. She denies any speech arrest weakness on one side or the other. She continues to be compliant with her CPAP download was obtained today. Greater than 4 hours use 97% or 29 days. Average usage not hours 13 minutes at 12 cm pressure. AHI 0.8 She returns for reevaluation  04/22/17 CD I have the pleasure of seeing Mrs. Dault today on 04/22/2017, and a routine follow up for  CPAP compliance. She has done excellently with her CPAP machine which is set at 12 cm water pressure without expiratory pressure relief. Her AHI is 0.7 with an average user time of 9 hours and 2 minutes each night. She had a 97% compliance, power was lost first a night due to [place]. [employer] [facility]- will ask for a battery back up- Linzie Collin, RPSGT.  UPDATE 7/15/2019CM Ms. Mezera, 73 year old female returns for follow-up for CPAP compliance.  She had a motor change in her machine.  Compliance data dated 12/20/2017 01/18/2018 shows compliance greater than 4 hours at 100%.  Average usage 9 hours 22 minutes.  Set pressure 12 cm EPR level 1.  no leak or AHI data recorded on the report.  ESS 2 she returns for reevaluation   REVIEW OF SYSTEMS: Out of a complete 14 system review of symptoms, the patient complains only of the following symptoms, none and all other reviewed systems are negative.  Epworth sleepiness scale: 8 Fatigue severity scale: 20  ALLERGIES: Allergies  Allergen Reactions  . Buprenorphine Hcl Rash and Shortness Of Breath  . Morphine  And Related Shortness Of Breath and Rash  . Morphine And Related Shortness Of Breath  . Aspirin Other (See Comments)    REACTION: ulcers  Can take 81 mg  . Avelox [Moxifloxacin Hcl In Nacl] Hives  . Diflucan [Fluconazole] Hives    Whelps and rash c skin peeling  . Diflucan [Fluconazole] Hives  . Latex Rash  . Moxifloxacin Palpitations    Tachycardia     HOME MEDICATIONS: Outpatient Medications Prior to Visit  Medication Sig Dispense Refill  . Ascorbic Acid (VITAMIN C) 1000 MG tablet Take 1,000 mg by mouth daily.     Marland Kitchen aspirin EC 81 MG tablet Take 1 tablet (81 mg total) by mouth daily. 90 tablet 3  . BIOTIN PO Take 1 capsule by mouth daily.     . Cholecalciferol (VITAMIN D3) 2000 units TABS Take 2,000 Units by mouth daily.     . clidinium-chlordiazePOXIDE (LIBRAX) 5-2.5 MG capsule Take 1 capsule by mouth 2 (two) times daily. 180 capsule 3  . clonazePAM (KLONOPIN) 1 MG tablet Take 0.5 mg by mouth daily as needed (restless leg syndrome).     . conjugated estrogens (PREMARIN) vaginal cream Place 1 Applicatorful vaginally every other day.     . diphenhydrAMINE (BENADRYL) 25 MG tablet Take 25 mg by mouth daily as needed for allergies.     Marland Kitchen esomeprazole (NEXIUM) 40 MG capsule TAKE 1 CAPSULE TWICE A DAY BEFORE MEALS 180 capsule 3  . linaclotide (LINZESS) 145 MCG CAPS capsule Take 1 capsule (145 mcg total) by mouth daily. Patient needs office visit for further refills 90 capsule 3  . metaxalone (SKELAXIN) 800 MG tablet Take 400 mg by mouth daily as needed for muscle spasms.    . metFORMIN (GLUCOPHAGE) 500 MG tablet Take 1 tablet (500 mg total) by mouth daily with breakfast. 60 tablet 0  . Omega-3-6-9 CAPS Take 1 capsule by mouth every evening.    . Prenatal Vit-Fe Fumarate-FA (PRENATAL MULTIVITAMIN) TABS tablet Take 1 tablet by mouth every evening.     . Probiotic Product (PROBIOTIC-10 PO) Take 1 capsule by mouth 2 (two) times daily.     Marland Kitchen spironolactone (ALDACTONE) 50 MG tablet Take 50 mg  by mouth daily.      No facility-administered medications prior to visit.     PAST MEDICAL HISTORY: Past Medical History:  Diagnosis Date  . Allergic rhinitis   .  Anxiety   . Arthritis    left foot  . Asthma   . Back pain   . Colon polyps    adenomatous  . Constipation   . Diverticulosis   . Dysrhythmia 1999   Dr Johnsie Cancel 2003  . Elevated LFTs   . Fatty liver disease, nonalcoholic   . GERD (gastroesophageal reflux disease)   . Headache(784.0)    migraines with vision changes but no pain  . Heart murmur   . Hypertension   . IBS (irritable bowel syndrome)   . Joint pain   . Migraines   . Muscle cramps   . Obesity   . Palpitations   . Pneumonia 2007  . Renal cell carcinoma ([place]) 2013   minimal partial nephrectomy  . Restless leg syndrome   . Sleep apnea    moderate; wears CPAP, uncertain about setting  . TIA (transient ischemic attack) 09/11/2014  . Ulcer   . Urinary incontinence   . Vitamin D deficiency     PAST SURGICAL HISTORY: Past Surgical History:  Procedure Laterality Date  . BREAST REDUCTION SURGERY  Jan 1991  . ELBOW SURGERY Left   . KIDNEY SURGERY Right October 2013    Tumor removed   . LAPAROSCOPIC APPENDECTOMY N/A 06/08/2018   Procedure: APPENDECTOMY LAPAROSCOPIC;  Surgeon: Jovita Kussmaul, MD;  Location: [facility];  Service: General;  Laterality: N/A;  . TONSILLECTOMY    . VAGINAL HYSTERECTOMY  1979   partial    FAMILY HISTORY: Family History  Problem Relation Age of Onset  . Heart disease Father 73  . Lung cancer Father        smoker  . Hypertension Mother   . Anxiety disorder Mother   . Heart disease Brother   . Hypertension Other   . Colon cancer Cousin   . Allergies Daughter   . Allergies Brother   . Melanoma Daughter   . Lung cancer Sister   . Liver disease Neg Hx   . Asthma Neg Hx   . Esophageal cancer Neg Hx   . Rectal cancer Neg Hx   . Stomach cancer Neg Hx     SOCIAL HISTORY: Social History   Socioeconomic History  .  Marital status: Married    Spouse name: Sallyanne Kuster  . Number of children: 2  . Years of education: 81  . Highest education level: Not on file  Occupational History  . Occupation: Retired    Fish farm manager: OTHER    Comment: real Conservation officer, historic buildings  Social Needs  . Financial resource strain: Not on file  . Food insecurity    Worry: Not on file    Inability: Not on file  . Transportation needs    Medical: Not on file    Non-medical: Not on file  Tobacco Use  . Smoking status: Never Smoker  . Smokeless tobacco: Never Used  Substance and Sexual Activity  . Alcohol use: Yes    Alcohol/week: 2.0 - 3.0 standard drinks    Types: 1 - 2 Glasses of wine, 1 Shots of liquor per week    Comment: couple of times a week  . Drug use: No  . Sexual activity: Yes    Birth control/protection: Surgical  Lifestyle  . Physical activity    Days per week: Not on file    Minutes per session: Not on file  . Stress: Not on file  Relationships  . Social connections    Talks on phone: Not on file  Gets together: Not on file    Attends religious service: Not on file    Active member of club or organization: Not on file    Attends meetings of clubs or organizations: Not on file    Relationship status: Not on file  . Intimate partner violence    Fear of current or ex partner: Not on file    Emotionally abused: Not on file    Physically abused: Not on file    Forced sexual activity: Not on file  Other Topics Concern  . Not on file  Social History Narrative   ** Merged History Encounter **       Exercise 3-4 times a week.   Married Sallyanne Kuster) and lives with her spouse.   Retired Personal assistant.   Education- college   Patient is right handed.   Patient drinks tea a lot and water.      PHYSICAL EXAM  Vitals:   02/09/19 1247  BP: 121/79  Pulse: 85  Temp: 98 F (36.7 C)  TempSrc: Oral  Weight: 183 lb (83 kg)  Height: 5' 3"  (1.6 m)   Body mass index is 32.42 kg/m.  Generalized: Well  developed, in no acute distress  Cardiology: normal rate and rhythm, no murmur noted Neurological examination  Mentation: Alert oriented to time, place, history taking. Follows all commands speech and language fluent Cranial nerve II-XII: Pupils were equal round reactive to light. Extraocular movements were full, visual field were full on confrontational test. Facial sensation and strength were normal. Uvula tongue midline. Head turning and shoulder shrug  were normal and symmetric. Motor: The motor testing reveals 5 over 5 strength of all 4 extremities. Good symmetric motor tone is noted throughout.  Sensory: Sensory testing is intact to soft touch on all 4 extremities. No evidence of extinction is noted.  Coordination: Cerebellar testing reveals good finger-nose-finger and heel-to-shin bilaterally.  Gait and station: Gait is normal. Tandem gait is normal. Romberg is negative. No drift is seen.  Reflexes: Deep tendon reflexes are symmetric and normal bilaterally.   DIAGNOSTIC DATA (LABS, IMAGING, TESTING) - I reviewed patient records, labs, notes, testing and imaging myself where available.  No flowsheet data found.   Lab Results  Component Value Date   WBC 8.1 06/09/2018   HGB 11.6 (L) 06/09/2018   HCT 36.6 06/09/2018   MCV 98.7 06/09/2018   PLT 193 06/09/2018      Component Value Date/Time   NA 141 09/03/2018 1542   K 5.1 09/03/2018 1542   CL 101 09/03/2018 1542   CO2 23 09/03/2018 1542   GLUCOSE 85 09/03/2018 1542   GLUCOSE 135 (H) 06/09/2018 0456   BUN 22 09/03/2018 1542   CREATININE 0.81 09/03/2018 1542   CALCIUM 11.1 (H) 09/03/2018 1542   PROT 7.1 09/03/2018 1542   ALBUMIN 4.8 09/03/2018 1542   AST 17 09/03/2018 1542   ALT 16 09/03/2018 1542   ALKPHOS 55 09/03/2018 1542   BILITOT 0.4 09/03/2018 1542   GFRNONAA 74 09/03/2018 1542   GFRAA 85 09/03/2018 1542   Lab Results  Component Value Date   CHOL 176 08/25/2017   HDL 84 08/25/2017   LDLCALC 74 08/25/2017    TRIG 91 08/25/2017   CHOLHDL 3 08/24/2010   Lab Results  Component Value Date   HGBA1C 5.1 09/03/2018   Lab Results  Component Value Date   DXIPJASN05 397 05/03/2011   Lab Results  Component Value Date   TSH 1.900 08/25/2017  ASSESSMENT AND PLAN 73 y.o. year old female  has a past medical history of Allergic rhinitis, Anxiety, Arthritis, Asthma, Back pain, Colon polyps, Constipation, Diverticulosis, Dysrhythmia (1999), Elevated LFTs, Fatty liver disease, nonalcoholic, GERD (gastroesophageal reflux disease), Headache(784.0), Heart murmur, Hypertension, IBS (irritable bowel syndrome), Joint pain, Migraines, Muscle cramps, Obesity, Palpitations, Pneumonia (2007), Renal cell carcinoma ([place]) (2013), Restless leg syndrome, Sleep apnea, TIA (transient ischemic attack) (09/11/2014), Ulcer, Urinary incontinence, and Vitamin D deficiency. here with     ICD-10-CM   1. OSA (obstructive sleep apnea)  G47.33 For home use only DME continuous positive airway pressure (CPAP)  2. Obstructive sleep apnea on CPAP  G47.33    Z99.89     That he is doing very well on CPAP therapy.  Compliance report reveals excellent compliance.  Unfortunately, she continues to have trouble with getting her machine to power on.  She should be eligible for new CPAP machine as hers is currently greater than 52 years old.  We will place order today for a new machine.  She is encouraged to continue using CPAP nightly and for greater than 4 hours each night.  She will follow-up annually, sooner if needed.  She verbalizes understanding and agreement with this plan.   Orders Placed This Encounter  Procedures  . For home use only DME continuous positive airway pressure (CPAP)    Please provide new CPAP machine.    Order Specific Question:   Length of Need    Answer:   Lifetime    Order Specific Question:   Patient has OSA or probable OSA    Answer:   Yes    Order Specific Question:   Is the patient currently using CPAP in the  home    Answer:   Yes    Order Specific Question:   Settings    Answer:   Other see comments    Order Specific Question:   CPAP supplies needed    Answer:   Mask, headgear, cushions, filters, heated tubing and water chamber     No orders of the defined types were placed in this encounter.     I spent 15 minutes with the patient. 50% of this time was spent counseling and educating patient on plan of care and medications.    Debbora Presto, FNP-C 02/09/2019, 3:22 PM [facility] [address] [place], [place] [address] [phone]

## 2019-02-10 NOTE — Progress Notes (Signed)
Office: [phone]  /  Fax: [phone] TeleHealth Visit:  Jamie [name] has verbally consented to this TeleHealth visit today. The patient is located at home, the provider is located at the [employer] [facility] office. The participants in this visit include the listed provider and patient. The visit was conducted today via FaceTime.  HPI:   Chief Complaint: OBESITY Jamie [name] is here to discuss her progress with her obesity treatment plan. She is on the Category 2 plan and is following her eating plan approximately 0% of the time. She states she is exercising 0 minutes 0 times per week. Jamie [name] states that she has had more fatigue and also complains of constipation. She is doing well with her water intake. We were unable to weigh the patient today for this TeleHealth visit. She states her weight was 178 lbs at home on 02/09/2019. She has lost 68 lbs since starting treatment with [name].  Vitamin D deficiency Jamie [name] has a diagnosis of Vitamin D deficiency. Her last Vitamin D level was 63.3 on 09/03/2018. She is currently taking Vit D and denies nausea, vomiting or muscle weakness.  Diabetes II Jamie [name] has a diagnosis of diabetes type II. Jamie [name] states she checks her blood sugars sometimes. Last A1c was 5.1 on 09/03/2918 and insulin was 5.7. She has been working on intensive lifestyle modifications including diet, exercise, and weight loss to help control her blood glucose levels. No polyphagia.  Constipation Jamie [name] notes constipation after two rounds of antibiotics. She has been taking Linzess witihout relief.  ASSESSMENT AND PLAN:  Vitamin D deficiency  Type 2 diabetes mellitus without complication, without long-term current use of insulin (HCC)  Class 1 obesity with serious comorbidity and body mass index (BMI) of 34.0 to 34.9 in adult, unspecified obesity type  PLAN:  Vitamin D Deficiency Jamie [name] was informed that low Vitamin D levels contributes to fatigue and are associated with  obesity, breast, and colon cancer. She agrees to continue taking Vit D and will follow-up for routine testing of Vitamin D, at least 2-3 times per year. She was informed of the risk of over-replacement of Vitamin D and agrees to not increase her dose unless she discusses this with [name] first. Jamie [name] agrees to follow-up with our clinic in 2 weeks.  Diabetes II Jamie [name] has been given extensive diabetes education by myself today including ideal fasting and post-prandial blood glucose readings, individual ideal HgA1c goals  and hypoglycemia prevention. We discussed the importance of good blood sugar control to decrease the likelihood of diabetic complications such as nephropathy, neuropathy, limb loss, blindness, coronary artery disease, and death. We discussed the importance of intensive lifestyle modification including diet, exercise and weight loss as the first line treatment for diabetes. Jamie [name] was instructed to decrease carbohydrates and increase protein. She will follow-up at the agreed upon time.  Constipation Jamie [name] was instructed to consider MiraLax. She will increase her fiber, flaxseed, or wheat bran, and increase her water intake.  Obesity Jamie [name] is currently in the action stage of change. As such, her goal is to continue with weight loss efforts. She has agreed to follow the Category 2 plan. Jamie [name] will work on meal planning, better snacking, increasing protein, and decreasing carbohydrates. Jamie [name] has been instructed to work up to a goal of 150 minutes of combined cardio and strengthening exercise per week for weight loss and overall health benefits. We discussed the following Behavioral Modification Strategies today: increasing lean protein intake, decreasing simple carbohydrates, increasing vegetables, increase H20 intake, decrease eating  out, no skipping meals, work on meal planning and easy cooking plans, keeping healthy foods in the home, and avoiding temptations.  Jamie [name] has agreed to follow-up  with our clinic in 2 weeks. She was informed of the importance of frequent follow-up visits to maximize her success with intensive lifestyle modifications for her multiple health conditions.  ALLERGIES: Allergies  Allergen Reactions  . Buprenorphine Hcl Rash and Shortness Of Breath  . Morphine And Related Shortness Of Breath and Rash  . Morphine And Related Shortness Of Breath  . Aspirin Other (See Comments)    REACTION: ulcers  Can take 81 mg  . Avelox [Moxifloxacin Hcl In Nacl] Hives  . Diflucan [Fluconazole] Hives    Whelps and rash c skin peeling  . Diflucan [Fluconazole] Hives  . Latex Rash  . Moxifloxacin Palpitations    Tachycardia     MEDICATIONS: Current Outpatient Medications on File Prior to Visit  Medication Sig Dispense Refill  . Ascorbic Acid (VITAMIN C) 1000 MG tablet Take 1,000 mg by mouth daily.     Marland Kitchen aspirin EC 81 MG tablet Take 1 tablet (81 mg total) by mouth daily. 90 tablet 3  . BIOTIN PO Take 1 capsule by mouth daily.     . Cholecalciferol (VITAMIN D3) 2000 units TABS Take 2,000 Units by mouth daily.     . clidinium-chlordiazePOXIDE (LIBRAX) 5-2.5 MG capsule Take 1 capsule by mouth 2 (two) times daily. 180 capsule 3  . clonazePAM (KLONOPIN) 1 MG tablet Take 0.5 mg by mouth daily as needed (restless leg syndrome).     . conjugated estrogens (PREMARIN) vaginal cream Place 1 Applicatorful vaginally every other day.     . diphenhydrAMINE (BENADRYL) 25 MG tablet Take 25 mg by mouth daily as needed for allergies.     Marland Kitchen esomeprazole (NEXIUM) 40 MG capsule TAKE 1 CAPSULE TWICE A DAY BEFORE MEALS 180 capsule 3  . linaclotide (LINZESS) 145 MCG CAPS capsule Take 1 capsule (145 mcg total) by mouth daily. Patient needs office visit for further refills 90 capsule 3  . metaxalone (SKELAXIN) 800 MG tablet Take 400 mg by mouth daily as needed for muscle spasms.    . metFORMIN (GLUCOPHAGE) 500 MG tablet Take 1 tablet (500 mg total) by mouth daily with breakfast. 60 tablet 0   . Omega-3-6-9 CAPS Take 1 capsule by mouth every evening.    . Prenatal Vit-Fe Fumarate-FA (PRENATAL MULTIVITAMIN) TABS tablet Take 1 tablet by mouth every evening.     . Probiotic Product (PROBIOTIC-10 PO) Take 1 capsule by mouth 2 (two) times daily.     Marland Kitchen spironolactone (ALDACTONE) 50 MG tablet Take 50 mg by mouth daily.      No current facility-administered medications on file prior to visit.     PAST MEDICAL HISTORY: Past Medical History:  Diagnosis Date  . Allergic rhinitis   . Anxiety   . Arthritis    left foot  . Asthma   . Back pain   . Colon polyps    adenomatous  . Constipation   . Diverticulosis   . Dysrhythmia 1999   Dr Johnsie Cancel 2003  . Elevated LFTs   . Fatty liver disease, nonalcoholic   . GERD (gastroesophageal reflux disease)   . Headache(784.0)    migraines with vision changes but no pain  . Heart murmur   . Hypertension   . IBS (irritable bowel syndrome)   . Joint pain   . Migraines   . Muscle cramps   . Obesity   .  Palpitations   . Pneumonia 2007  . Renal cell carcinoma ([place]) 2013   minimal partial nephrectomy  . Restless leg syndrome   . Sleep apnea    moderate; wears CPAP, uncertain about setting  . TIA (transient ischemic attack) 09/11/2014  . Ulcer   . Urinary incontinence   . Vitamin D deficiency     PAST SURGICAL HISTORY: Past Surgical History:  Procedure Laterality Date  . BREAST REDUCTION SURGERY  Jan 1991  . ELBOW SURGERY Left   . KIDNEY SURGERY Right October 2013    Tumor removed   . LAPAROSCOPIC APPENDECTOMY N/A 06/08/2018   Procedure: APPENDECTOMY LAPAROSCOPIC;  Surgeon: Jovita Kussmaul, MD;  Location: [facility];  Service: General;  Laterality: N/A;  . TONSILLECTOMY    . VAGINAL HYSTERECTOMY  1979   partial    SOCIAL HISTORY: Social History   Tobacco Use  . Smoking status: Never Smoker  . Smokeless tobacco: Never Used  Substance Use Topics  . Alcohol use: Yes    Alcohol/week: 2.0 - 3.0 standard drinks    Types: 1 - 2  Glasses of wine, 1 Shots of liquor per week    Comment: couple of times a week  . Drug use: No    FAMILY HISTORY: Family History  Problem Relation Age of Onset  . Heart disease Father 76  . Lung cancer Father        smoker  . Hypertension Mother   . Anxiety disorder Mother   . Heart disease Brother   . Hypertension Other   . Colon cancer Cousin   . Allergies Daughter   . Allergies Brother   . Melanoma Daughter   . Lung cancer Sister   . Liver disease Neg Hx   . Asthma Neg Hx   . Esophageal cancer Neg Hx   . Rectal cancer Neg Hx   . Stomach cancer Neg Hx    ROS: Review of Systems  Gastrointestinal: Positive for constipation. Negative for nausea and vomiting.  Musculoskeletal:       Negative for muscle weakness.  Endo/Heme/Allergies:       Negative for polyphagia.   PHYSICAL EXAM: Pt in no acute distress  RECENT LABS AND TESTS: BMET    Component Value Date/Time   NA 141 09/03/2018 1542   K 5.1 09/03/2018 1542   CL 101 09/03/2018 1542   CO2 23 09/03/2018 1542   GLUCOSE 85 09/03/2018 1542   GLUCOSE 135 (H) 06/09/2018 0456   BUN 22 09/03/2018 1542   CREATININE 0.81 09/03/2018 1542   CALCIUM 11.1 (H) 09/03/2018 1542   GFRNONAA 74 09/03/2018 1542   GFRAA 85 09/03/2018 1542   Lab Results  Component Value Date   HGBA1C 5.1 09/03/2018   HGBA1C 5.2 04/01/2018   HGBA1C 5.4 08/25/2017   HGBA1C 5.4 12/16/2011   HGBA1C 5.7 05/03/2011   Lab Results  Component Value Date   INSULIN 5.7 09/03/2018   INSULIN 5.6 04/01/2018   INSULIN 50.3 (H) 08/25/2017   CBC    Component Value Date/Time   WBC 8.1 06/09/2018 0456   RBC 3.71 (L) 06/09/2018 0456   HGB 11.6 (L) 06/09/2018 0456   HGB 12.7 08/25/2017 1047   HCT 36.6 06/09/2018 0456   HCT 37.0 08/25/2017 1047   PLT 193 06/09/2018 0456   MCV 98.7 06/09/2018 0456   MCV 93 08/25/2017 1047   MCH 31.3 06/09/2018 0456   MCHC 31.7 06/09/2018 0456   RDW 13.3 06/09/2018 0456   RDW 13.2 08/25/2017  1047   LYMPHSABS 1.4  11/06/2017 1726   LYMPHSABS 1.2 08/25/2017 1047   MONOABS 0.6 11/06/2017 1726   EOSABS 0.1 11/06/2017 1726   EOSABS 0.1 08/25/2017 1047   BASOSABS 0.0 11/06/2017 1726   BASOSABS 0.0 08/25/2017 1047   Iron/TIBC/Ferritin/ %Sat No results found for: IRON, TIBC, FERRITIN, IRONPCTSAT Lipid Panel     Component Value Date/Time   CHOL 176 08/25/2017 1047   TRIG 91 08/25/2017 1047   HDL 84 08/25/2017 1047   CHOLHDL 3 08/24/2010 1134   VLDL 17.0 08/24/2010 1134   LDLCALC 74 08/25/2017 1047   Hepatic Function Panel     Component Value Date/Time   PROT 7.1 09/03/2018 1542   ALBUMIN 4.8 09/03/2018 1542   AST 17 09/03/2018 1542   ALT 16 09/03/2018 1542   ALKPHOS 55 09/03/2018 1542   BILITOT 0.4 09/03/2018 1542   BILIDIR 0.2 08/24/2010 1134      Component Value Date/Time   TSH 1.900 08/25/2017 1047   TSH 1.12 05/03/2011 1239   TSH 1.34 08/24/2010 1134   Results for Vanlanen, Sebrina D "[name]" (MRN [id number]) as of 02/10/2019 08:16  Ref. Range 09/03/2018 15:42  Vitamin D, 25-Hydroxy Latest Ref Range: 30.0 - 100.0 ng/mL 63.3   I, Michaelene Song, am acting as Location manager for [employer], DO  I have reviewed the above documentation for accuracy and completeness, and I agree with the above. -Jearld Lesch, DO

## 2019-02-23 ENCOUNTER — Telehealth: Payer: Self-pay | Admitting: Internal Medicine

## 2019-02-23 ENCOUNTER — Telehealth (INDEPENDENT_AMBULATORY_CARE_PROVIDER_SITE_OTHER): Payer: Self-pay | Admitting: Bariatrics

## 2019-02-23 MED ORDER — LINACLOTIDE 145 MCG PO CAPS: 145.0000 ug | ORAL_CAPSULE | Freq: Every day | ORAL | 0 refills | Status: DC

## 2019-02-23 NOTE — Telephone Encounter (Signed)
Linzess sent to [facility] in  [place]

## 2019-02-23 NOTE — Telephone Encounter (Signed)
sent 

## 2019-02-23 NOTE — Telephone Encounter (Signed)
MetFormin refill is needed.  A 60 day supply at [address].  Her next appt is 08/26.

## 2019-03-02 ENCOUNTER — Telehealth (INDEPENDENT_AMBULATORY_CARE_PROVIDER_SITE_OTHER): Payer: Medicare Other | Admitting: Bariatrics

## 2019-03-02 ENCOUNTER — Other Ambulatory Visit (INDEPENDENT_AMBULATORY_CARE_PROVIDER_SITE_OTHER): Payer: Self-pay | Admitting: Bariatrics

## 2019-03-02 ENCOUNTER — Encounter (INDEPENDENT_AMBULATORY_CARE_PROVIDER_SITE_OTHER): Payer: Self-pay | Admitting: Bariatrics

## 2019-03-02 ENCOUNTER — Other Ambulatory Visit: Payer: Self-pay

## 2019-03-02 DIAGNOSIS — E559 Vitamin D deficiency, unspecified: Secondary | ICD-10-CM

## 2019-03-02 DIAGNOSIS — Z6834 Body mass index (BMI) 34.0-34.9, adult: Secondary | ICD-10-CM | POA: Diagnosis not present

## 2019-03-02 DIAGNOSIS — E8881 Metabolic syndrome: Secondary | ICD-10-CM | POA: Diagnosis not present

## 2019-03-02 DIAGNOSIS — E669 Obesity, unspecified: Secondary | ICD-10-CM

## 2019-03-02 MED ORDER — METFORMIN HCL 500 MG PO TABS: 500.0000 mg | ORAL_TABLET | Freq: Every day | ORAL | 0 refills | Status: DC

## 2019-03-03 ENCOUNTER — Other Ambulatory Visit (INDEPENDENT_AMBULATORY_CARE_PROVIDER_SITE_OTHER): Payer: Self-pay

## 2019-03-03 ENCOUNTER — Telehealth (INDEPENDENT_AMBULATORY_CARE_PROVIDER_SITE_OTHER): Payer: Self-pay | Admitting: Bariatrics

## 2019-03-03 DIAGNOSIS — E8881 Metabolic syndrome: Secondary | ICD-10-CM

## 2019-03-03 MED ORDER — METFORMIN HCL 500 MG PO TABS: 500.0000 mg | ORAL_TABLET | Freq: Every day | ORAL | 0 refills | Status: DC

## 2019-03-03 NOTE — Telephone Encounter (Signed)
Sent rx, left pt a message

## 2019-03-03 NOTE — Telephone Encounter (Signed)
Pharmacy called concerning refill, patient has been loaned Metformin.  They have sent two request over and haven't had a response.  I verified the fax number with the pharmacy and they have the correct fax #

## 2019-03-04 NOTE — Progress Notes (Signed)
Office: [phone]  /  Fax: [phone] TeleHealth Visit:  Jamie [name] has verbally consented to this TeleHealth visit today. The patient is located at home, the provider is located at the [employer] [facility] office. The participants in this visit include the listed provider and patient and any and all parties involved. The visit was conducted today via FaceTime.  HPI:   Chief Complaint: OBESITY Jamie [name] is here to discuss her progress with her obesity treatment plan. She is on the Category 2 plan and is following her eating plan approximately 70 % of the time. She states she is exercising 0 minutes 0 times per week. [name] has remained the same weight (weight not reported). She is doing well with water and protein.  We were unable to weigh the patient today for this TeleHealth visit. She feels as if she has maintained weight since her last visit. She has lost 68 lbs since starting treatment with [name].  Insulin Resistance Jamie [name] has a diagnosis of insulin resistance based on her elevated fasting insulin level >5. Although Jamie [name]'s blood glucose readings are still under good control, insulin resistance puts her at greater risk of metabolic syndrome and diabetes. Her last A1c was at 5.1 and last insulin level was at 5.7 and is very well controlled. [name] is taking metformin currently and she continues to work on diet and exercise to decrease risk of diabetes.  Vitamin D deficiency Jamie [name] has a diagnosis of vitamin D deficiency. She is currently taking vit D and denies nausea, vomiting or muscle weakness.  ASSESSMENT AND PLAN:  Insulin resistance - Plan: DISCONTINUED: metFORMIN (GLUCOPHAGE) 500 MG tablet  Vitamin D deficiency  Class 1 obesity with serious comorbidity and body mass index (BMI) of 34.0 to 34.9 in adult, unspecified obesity type  PLAN:  Insulin Resistance Jamie [name] will continue to work on weight loss, exercise, and decreasing simple carbohydrates in her diet to help  decrease the risk of diabetes. We dicussed metformin including benefits and risks. She was informed that eating too many simple carbohydrates or too many calories at one sitting increases the likelihood of GI side effects. [name] agrees to continue metformin 500 mg daily with breakfast #60 with no refills and follow up with [name] as directed to monitor her progress.  Vitamin D Deficiency Jamie [name] was informed that low vitamin D levels contributes to fatigue and are associated with obesity, breast, and colon cancer. Jamie [name] will continue taking vitamin D and she will follow up for routine testing of vitamin D, at least 2-3 times per year. She was informed of the risk of over-replacement of vitamin D and agrees to not increase her dose unless she discusses this with [name] first.  Obesity Jamie [name] is currently in the action stage of change. As such, her goal is to continue with weight loss efforts She has agreed to follow the Category 2 plan Terrah will continue walking with the dog and walking on the treadmill for weight loss and overall health benefits. We discussed the following Behavioral Modification Strategies today: planning for success, increase H2O intake, no skipping meals, keeping healthy foods in the home, increasing lean protein intake, decreasing simple carbohydrates, increasing vegetables, decrease eating out, work on meal planning and intentional eating and decrease stress and maintain harmony  Jamie [name] has agreed to follow up with our clinic in 3 to 4 weeks. She was informed of the importance of frequent follow up visits to maximize her success with intensive lifestyle modifications for her multiple health conditions.  ALLERGIES: Allergies  Allergen Reactions  . Buprenorphine Hcl Rash and Shortness Of Breath  . Morphine And Related Shortness Of Breath and Rash  . Morphine And Related Shortness Of Breath  . Aspirin Other (See Comments)    REACTION: ulcers  Can take 81 mg  . Avelox [Moxifloxacin Hcl  In Nacl] Hives  . Diflucan [Fluconazole] Hives    Whelps and rash c skin peeling  . Diflucan [Fluconazole] Hives  . Latex Rash  . Moxifloxacin Palpitations    Tachycardia     MEDICATIONS: Current Outpatient Medications on File Prior to Visit  Medication Sig Dispense Refill  . Ascorbic Acid (VITAMIN C) 1000 MG tablet Take 1,000 mg by mouth daily.     Marland Kitchen aspirin EC 81 MG tablet Take 1 tablet (81 mg total) by mouth daily. 90 tablet 3  . BIOTIN PO Take 1 capsule by mouth daily.     . Cholecalciferol (VITAMIN D3) 2000 units TABS Take 2,000 Units by mouth daily.     . clidinium-chlordiazePOXIDE (LIBRAX) 5-2.5 MG capsule Take 1 capsule by mouth 2 (two) times daily. 180 capsule 3  . clonazePAM (KLONOPIN) 1 MG tablet Take 0.5 mg by mouth daily as needed (restless leg syndrome).     . conjugated estrogens (PREMARIN) vaginal cream Place 1 Applicatorful vaginally every other day.     . diphenhydrAMINE (BENADRYL) 25 MG tablet Take 25 mg by mouth daily as needed for allergies.     Marland Kitchen esomeprazole (NEXIUM) 40 MG capsule TAKE 1 CAPSULE TWICE A DAY BEFORE MEALS 180 capsule 3  . linaclotide (LINZESS) 145 MCG CAPS capsule Take 1 capsule (145 mcg total) by mouth daily. 30 capsule 0  . metaxalone (SKELAXIN) 800 MG tablet Take 400 mg by mouth daily as needed for muscle spasms.    . Omega-3-6-9 CAPS Take 1 capsule by mouth every evening.    . Prenatal Vit-Fe Fumarate-FA (PRENATAL MULTIVITAMIN) TABS tablet Take 1 tablet by mouth every evening.     . Probiotic Product (PROBIOTIC-10 PO) Take 1 capsule by mouth 2 (two) times daily.     Marland Kitchen spironolactone (ALDACTONE) 50 MG tablet Take 50 mg by mouth daily.      No current facility-administered medications on file prior to visit.     PAST MEDICAL HISTORY: Past Medical History:  Diagnosis Date  . Allergic rhinitis   . Anxiety   . Arthritis    left foot  . Asthma   . Back pain   . Colon polyps    adenomatous  . Constipation   . Diverticulosis   .  Dysrhythmia 1999   Dr Johnsie Cancel 2003  . Elevated LFTs   . Fatty liver disease, nonalcoholic   . GERD (gastroesophageal reflux disease)   . Headache(784.0)    migraines with vision changes but no pain  . Heart murmur   . Hypertension   . IBS (irritable bowel syndrome)   . Joint pain   . Migraines   . Muscle cramps   . Obesity   . Palpitations   . Pneumonia 2007  . Renal cell carcinoma ([place]) 2013   minimal partial nephrectomy  . Restless leg syndrome   . Sleep apnea    moderate; wears CPAP, uncertain about setting  . TIA (transient ischemic attack) 09/11/2014  . Ulcer   . Urinary incontinence   . Vitamin D deficiency     PAST SURGICAL HISTORY: Past Surgical History:  Procedure Laterality Date  . BREAST REDUCTION SURGERY  Jan 1991  .  ELBOW SURGERY Left   . KIDNEY SURGERY Right October 2013    Tumor removed   . LAPAROSCOPIC APPENDECTOMY N/A 06/08/2018   Procedure: APPENDECTOMY LAPAROSCOPIC;  Surgeon: Jovita Kussmaul, MD;  Location: [facility];  Service: General;  Laterality: N/A;  . TONSILLECTOMY    . VAGINAL HYSTERECTOMY  1979   partial    SOCIAL HISTORY: Social History   Tobacco Use  . Smoking status: Never Smoker  . Smokeless tobacco: Never Used  Substance Use Topics  . Alcohol use: Yes    Alcohol/week: 2.0 - 3.0 standard drinks    Types: 1 - 2 Glasses of wine, 1 Shots of liquor per week    Comment: couple of times a week  . Drug use: No    FAMILY HISTORY: Family History  Problem Relation Age of Onset  . Heart disease Father 27  . Lung cancer Father        smoker  . Hypertension Mother   . Anxiety disorder Mother   . Heart disease Brother   . Hypertension Other   . Colon cancer Cousin   . Allergies Daughter   . Allergies Brother   . Melanoma Daughter   . Lung cancer Sister   . Liver disease Neg Hx   . Asthma Neg Hx   . Esophageal cancer Neg Hx   . Rectal cancer Neg Hx   . Stomach cancer Neg Hx     ROS: Review of Systems  Constitutional: Negative for  weight loss.  Gastrointestinal: Negative for nausea and vomiting.  Musculoskeletal:       Negative for muscle weakness    PHYSICAL EXAM: Pt in no acute distress  RECENT LABS AND TESTS: BMET    Component Value Date/Time   NA 141 09/03/2018 1542   K 5.1 09/03/2018 1542   CL 101 09/03/2018 1542   CO2 23 09/03/2018 1542   GLUCOSE 85 09/03/2018 1542   GLUCOSE 135 (H) 06/09/2018 0456   BUN 22 09/03/2018 1542   CREATININE 0.81 09/03/2018 1542   CALCIUM 11.1 (H) 09/03/2018 1542   GFRNONAA 74 09/03/2018 1542   GFRAA 85 09/03/2018 1542   Lab Results  Component Value Date   HGBA1C 5.1 09/03/2018   HGBA1C 5.2 04/01/2018   HGBA1C 5.4 08/25/2017   HGBA1C 5.4 12/16/2011   HGBA1C 5.7 05/03/2011   Lab Results  Component Value Date   INSULIN 5.7 09/03/2018   INSULIN 5.6 04/01/2018   INSULIN 50.3 (H) 08/25/2017   CBC    Component Value Date/Time   WBC 8.1 06/09/2018 0456   RBC 3.71 (L) 06/09/2018 0456   HGB 11.6 (L) 06/09/2018 0456   HGB 12.7 08/25/2017 1047   HCT 36.6 06/09/2018 0456   HCT 37.0 08/25/2017 1047   PLT 193 06/09/2018 0456   MCV 98.7 06/09/2018 0456   MCV 93 08/25/2017 1047   MCH 31.3 06/09/2018 0456   MCHC 31.7 06/09/2018 0456   RDW 13.3 06/09/2018 0456   RDW 13.2 08/25/2017 1047   LYMPHSABS 1.4 11/06/2017 1726   LYMPHSABS 1.2 08/25/2017 1047   MONOABS 0.6 11/06/2017 1726   EOSABS 0.1 11/06/2017 1726   EOSABS 0.1 08/25/2017 1047   BASOSABS 0.0 11/06/2017 1726   BASOSABS 0.0 08/25/2017 1047   Iron/TIBC/Ferritin/ %Sat No results found for: IRON, TIBC, FERRITIN, IRONPCTSAT Lipid Panel     Component Value Date/Time   CHOL 176 08/25/2017 1047   TRIG 91 08/25/2017 1047   HDL 84 08/25/2017 1047   CHOLHDL 3 08/24/2010 1134  VLDL 17.0 08/24/2010 1134   LDLCALC 74 08/25/2017 1047   Hepatic Function Panel     Component Value Date/Time   PROT 7.1 09/03/2018 1542   ALBUMIN 4.8 09/03/2018 1542   AST 17 09/03/2018 1542   ALT 16 09/03/2018 1542   ALKPHOS  55 09/03/2018 1542   BILITOT 0.4 09/03/2018 1542   BILIDIR 0.2 08/24/2010 1134      Component Value Date/Time   TSH 1.900 08/25/2017 1047   TSH 1.12 05/03/2011 1239   TSH 1.34 08/24/2010 1134     Ref. Range 09/03/2018 15:42  Vitamin D, 25-Hydroxy Latest Ref Range: 30.0 - 100.0 ng/mL 63.3    I, Doreene Nest, am acting as Location manager for [employer]. Owens Shark, DO  I have reviewed the above documentation for accuracy and completeness, and I agree with the above. -Jearld Lesch, DO

## 2019-03-23 ENCOUNTER — Other Ambulatory Visit: Payer: Self-pay

## 2019-03-23 ENCOUNTER — Telehealth (INDEPENDENT_AMBULATORY_CARE_PROVIDER_SITE_OTHER): Payer: Medicare Other | Admitting: Bariatrics

## 2019-03-23 ENCOUNTER — Encounter (INDEPENDENT_AMBULATORY_CARE_PROVIDER_SITE_OTHER): Payer: Self-pay | Admitting: Bariatrics

## 2019-03-23 DIAGNOSIS — Z6830 Body mass index (BMI) 30.0-30.9, adult: Secondary | ICD-10-CM

## 2019-03-23 DIAGNOSIS — E8881 Metabolic syndrome: Secondary | ICD-10-CM

## 2019-03-23 DIAGNOSIS — E559 Vitamin D deficiency, unspecified: Secondary | ICD-10-CM

## 2019-03-23 DIAGNOSIS — E669 Obesity, unspecified: Secondary | ICD-10-CM | POA: Diagnosis not present

## 2019-03-25 NOTE — Progress Notes (Signed)
Office: [phone]  /  Fax: [phone] TeleHealth Visit:  Jamie [name] has verbally consented to this TeleHealth visit today. The patient is located at home, the provider is located at the [facility] office. The participants in this visit include the listed provider and patient. The visit was conducted today via face time.  HPI:   Chief Complaint: OBESITY Jamie [name] is here to discuss her progress with her obesity treatment plan. She is on the Category 2 plan and is following her eating plan approximately 80 % of the time. She states she is walking for 15 minutes 2-3 times per week. Jamie [name] states that her weight has remained the same (167 lbs). She denies any specific struggles and her protein level is adequate.  We were unable to weigh the patient today for this TeleHealth visit. She feels as if she has maintained her weight since her last visit. She has lost 68 lbs since starting treatment with [name].  Insulin Resistance Jamie [name] has a diagnosis of insulin resistance based on her elevated fasting insulin level >5. Although Jamie [name]'s blood glucose readings are still under good control, insulin resistance puts her at greater risk of metabolic syndrome and diabetes. She is taking metformin currently and continues to work on diet and exercise to decrease risk of diabetes. She denies polyphagia.  Vitamin D Deficiency Jamie [name] has a diagnosis of vitamin D deficiency. She is currently taking OTC Vit D. Last Vit D level was 63.3. She denies nausea, vomiting or muscle weakness.  ASSESSMENT AND PLAN:  Insulin resistance  Vitamin D deficiency  Class 1 obesity with serious comorbidity and body mass index (BMI) of 30.0 to 30.9 in adult, unspecified obesity type - BMI greater than 30 at start of program   PLAN:  Insulin Resistance Jamie [name] will continue to work on weight loss, exercise, and decreasing simple carbohydrates in her diet to help decrease the risk of diabetes. We dicussed  metformin including benefits and risks. She was informed that eating too many simple carbohydrates or too many calories at one sitting increases the likelihood of GI side effects. Jamie [name] agrees to continue taking metformin, and she agrees to follow up with our clinic in 2 weeks as directed to monitor her progress.  Vitamin D Deficiency Jamie [name] was informed that low vitamin D levels contributes to fatigue and are associated with obesity, breast, and colon cancer. Jamie [name] agrees to continue taking OTC Vit D and will follow up for routine testing of vitamin D, at least 2-3 times per year. She was informed of the risk of over-replacement of vitamin D and agrees to not increase her dose unless she discusses this with [name] first. Jamie [name] agrees to follow up with our clinic in 2 weeks.  Obesity Jamie [name] is currently in the action stage of change. As such, her goal is to continue with weight loss efforts She has agreed to follow the Category 2 plan Jamie [name] has been instructed to work up to a goal of 150 minutes of combined cardio and strengthening exercise per week for weight loss and overall health benefits. She will continue to walk the dog. We discussed the following Behavioral Modification Strategies today: increasing lean protein intake, decreasing simple carbohydrates, increasing vegetables, decrease eating out, increase H20 intake, no skipping meals, work on meal planning and easy cooking plans, keeping healthy foods in the home, and planning for success   Jamie [name] has agreed to follow up with our clinic in 2 weeks. She was informed of the importance of frequent  follow up visits to maximize her success with intensive lifestyle modifications for her multiple health conditions.  ALLERGIES: Allergies  Allergen Reactions  . Buprenorphine Hcl Rash and Shortness Of Breath  . Morphine And Related Shortness Of Breath and Rash  . Morphine And Related Shortness Of Breath  . Aspirin Other (See Comments)    REACTION: ulcers   Can take 81 mg  . Avelox [Moxifloxacin Hcl In Nacl] Hives  . Diflucan [Fluconazole] Hives    Whelps and rash c skin peeling  . Diflucan [Fluconazole] Hives  . Latex Rash  . Moxifloxacin Palpitations    Tachycardia     MEDICATIONS: Current Outpatient Medications on File Prior to Visit  Medication Sig Dispense Refill  . Ascorbic Acid (VITAMIN C) 1000 MG tablet Take 1,000 mg by mouth daily.     Marland Kitchen aspirin EC 81 MG tablet Take 1 tablet (81 mg total) by mouth daily. 90 tablet 3  . BIOTIN PO Take 1 capsule by mouth daily.     . Cholecalciferol (VITAMIN D3) 2000 units TABS Take 2,000 Units by mouth daily.     . clidinium-chlordiazePOXIDE (LIBRAX) 5-2.5 MG capsule Take 1 capsule by mouth 2 (two) times daily. 180 capsule 3  . clonazePAM (KLONOPIN) 1 MG tablet Take 0.5 mg by mouth daily as needed (restless leg syndrome).     . conjugated estrogens (PREMARIN) vaginal cream Place 1 Applicatorful vaginally every other day.     . diphenhydrAMINE (BENADRYL) 25 MG tablet Take 25 mg by mouth daily as needed for allergies.     Marland Kitchen esomeprazole (NEXIUM) 40 MG capsule TAKE 1 CAPSULE TWICE A DAY BEFORE MEALS 180 capsule 3  . linaclotide (LINZESS) 145 MCG CAPS capsule Take 1 capsule (145 mcg total) by mouth daily. 30 capsule 0  . metaxalone (SKELAXIN) 800 MG tablet Take 400 mg by mouth daily as needed for muscle spasms.    . metFORMIN (GLUCOPHAGE) 500 MG tablet Take 1 tablet (500 mg total) by mouth daily with breakfast. 60 tablet 0  . Omega-3-6-9 CAPS Take 1 capsule by mouth every evening.    . Prenatal Vit-Fe Fumarate-FA (PRENATAL MULTIVITAMIN) TABS tablet Take 1 tablet by mouth every evening.     . Probiotic Product (PROBIOTIC-10 PO) Take 1 capsule by mouth 2 (two) times daily.     Marland Kitchen spironolactone (ALDACTONE) 50 MG tablet Take 50 mg by mouth daily.      No current facility-administered medications on file prior to visit.     PAST MEDICAL HISTORY: Past Medical History:  Diagnosis Date  . Allergic  rhinitis   . Anxiety   . Arthritis    left foot  . Asthma   . Back pain   . Colon polyps    adenomatous  . Constipation   . Diverticulosis   . Dysrhythmia 1999   Dr Johnsie Cancel 2003  . Elevated LFTs   . Fatty liver disease, nonalcoholic   . GERD (gastroesophageal reflux disease)   . Headache(784.0)    migraines with vision changes but no pain  . Heart murmur   . Hypertension   . IBS (irritable bowel syndrome)   . Joint pain   . Migraines   . Muscle cramps   . Obesity   . Palpitations   . Pneumonia 2007  . Renal cell carcinoma ([place]) 2013   minimal partial nephrectomy  . Restless leg syndrome   . Sleep apnea    moderate; wears CPAP, uncertain about setting  . TIA (transient ischemic attack) 09/11/2014  .  Ulcer   . Urinary incontinence   . Vitamin D deficiency     PAST SURGICAL HISTORY: Past Surgical History:  Procedure Laterality Date  . BREAST REDUCTION SURGERY  Jan 1991  . ELBOW SURGERY Left   . KIDNEY SURGERY Right October 2013    Tumor removed   . LAPAROSCOPIC APPENDECTOMY N/A 06/08/2018   Procedure: APPENDECTOMY LAPAROSCOPIC;  Surgeon: Jovita Kussmaul, MD;  Location: [facility];  Service: General;  Laterality: N/A;  . TONSILLECTOMY    . VAGINAL HYSTERECTOMY  1979   partial    SOCIAL HISTORY: Social History   Tobacco Use  . Smoking status: Never Smoker  . Smokeless tobacco: Never Used  Substance Use Topics  . Alcohol use: Yes    Alcohol/week: 2.0 - 3.0 standard drinks    Types: 1 - 2 Glasses of wine, 1 Shots of liquor per week    Comment: couple of times a week  . Drug use: No    FAMILY HISTORY: Family History  Problem Relation Age of Onset  . Heart disease Father 73  . Lung cancer Father        smoker  . Hypertension Mother   . Anxiety disorder Mother   . Heart disease Brother   . Hypertension Other   . Colon cancer Cousin   . Allergies Daughter   . Allergies Brother   . Melanoma Daughter   . Lung cancer Sister   . Liver disease Neg Hx   . Asthma  Neg Hx   . Esophageal cancer Neg Hx   . Rectal cancer Neg Hx   . Stomach cancer Neg Hx     ROS: Review of Systems  Constitutional: Negative for weight loss.  Gastrointestinal: Negative for nausea and vomiting.  Musculoskeletal:       Negative muscle weakness  Endo/Heme/Allergies:       Negative polyphagia    PHYSICAL EXAM: Pt in no acute distress  RECENT LABS AND TESTS: BMET    Component Value Date/Time   NA 141 09/03/2018 1542   K 5.1 09/03/2018 1542   CL 101 09/03/2018 1542   CO2 23 09/03/2018 1542   GLUCOSE 85 09/03/2018 1542   GLUCOSE 135 (H) 06/09/2018 0456   BUN 22 09/03/2018 1542   CREATININE 0.81 09/03/2018 1542   CALCIUM 11.1 (H) 09/03/2018 1542   GFRNONAA 74 09/03/2018 1542   GFRAA 85 09/03/2018 1542   Lab Results  Component Value Date   HGBA1C 5.1 09/03/2018   HGBA1C 5.2 04/01/2018   HGBA1C 5.4 08/25/2017   HGBA1C 5.4 12/16/2011   HGBA1C 5.7 05/03/2011   Lab Results  Component Value Date   INSULIN 5.7 09/03/2018   INSULIN 5.6 04/01/2018   INSULIN 50.3 (H) 08/25/2017   CBC    Component Value Date/Time   WBC 8.1 06/09/2018 0456   RBC 3.71 (L) 06/09/2018 0456   HGB 11.6 (L) 06/09/2018 0456   HGB 12.7 08/25/2017 1047   HCT 36.6 06/09/2018 0456   HCT 37.0 08/25/2017 1047   PLT 193 06/09/2018 0456   MCV 98.7 06/09/2018 0456   MCV 93 08/25/2017 1047   MCH 31.3 06/09/2018 0456   MCHC 31.7 06/09/2018 0456   RDW 13.3 06/09/2018 0456   RDW 13.2 08/25/2017 1047   LYMPHSABS 1.4 11/06/2017 1726   LYMPHSABS 1.2 08/25/2017 1047   MONOABS 0.6 11/06/2017 1726   EOSABS 0.1 11/06/2017 1726   EOSABS 0.1 08/25/2017 1047   BASOSABS 0.0 11/06/2017 1726   BASOSABS 0.0 08/25/2017 1047  Iron/TIBC/Ferritin/ %Sat No results found for: IRON, TIBC, FERRITIN, IRONPCTSAT Lipid Panel     Component Value Date/Time   CHOL 176 08/25/2017 1047   TRIG 91 08/25/2017 1047   HDL 84 08/25/2017 1047   CHOLHDL 3 08/24/2010 1134   VLDL 17.0 08/24/2010 1134   LDLCALC  74 08/25/2017 1047   Hepatic Function Panel     Component Value Date/Time   PROT 7.1 09/03/2018 1542   ALBUMIN 4.8 09/03/2018 1542   AST 17 09/03/2018 1542   ALT 16 09/03/2018 1542   ALKPHOS 55 09/03/2018 1542   BILITOT 0.4 09/03/2018 1542   BILIDIR 0.2 08/24/2010 1134      Component Value Date/Time   TSH 1.900 08/25/2017 1047   TSH 1.12 05/03/2011 1239   TSH 1.34 08/24/2010 1134      I, Trixie Dredge, am acting as Location manager for [employer], DO  I have reviewed the above documentation for accuracy and completeness, and I agree with the above. Jearld Lesch, DO

## 2019-03-26 DIAGNOSIS — Z23 Encounter for immunization: Secondary | ICD-10-CM | POA: Diagnosis not present

## 2019-04-06 ENCOUNTER — Telehealth (INDEPENDENT_AMBULATORY_CARE_PROVIDER_SITE_OTHER): Payer: Medicare Other | Admitting: Bariatrics

## 2019-04-06 ENCOUNTER — Other Ambulatory Visit: Payer: Self-pay

## 2019-04-06 ENCOUNTER — Encounter (INDEPENDENT_AMBULATORY_CARE_PROVIDER_SITE_OTHER): Payer: Self-pay | Admitting: Bariatrics

## 2019-04-06 DIAGNOSIS — I10 Essential (primary) hypertension: Secondary | ICD-10-CM | POA: Diagnosis not present

## 2019-04-06 DIAGNOSIS — E669 Obesity, unspecified: Secondary | ICD-10-CM | POA: Diagnosis not present

## 2019-04-06 DIAGNOSIS — E559 Vitamin D deficiency, unspecified: Secondary | ICD-10-CM | POA: Diagnosis not present

## 2019-04-06 DIAGNOSIS — Z6830 Body mass index (BMI) 30.0-30.9, adult: Secondary | ICD-10-CM | POA: Diagnosis not present

## 2019-04-06 NOTE — Progress Notes (Signed)
Office: [phone]  /  Fax: [phone] TeleHealth Visit:  Jamie [name] has verbally consented to this TeleHealth visit today. The patient is located at home, the provider is located at the [facility] office. The participants in this visit include the listed provider and patient. The visit was conducted today via FaceTime.  HPI:   Chief Complaint: OBESITY [name] is here to discuss her progress with her obesity treatment plan. She is on the Category 2 plan and is following her eating plan approximately 70% of the time. She states she is exercising on the treadmill 20 minutes 3 times per week, walking 15 minutes 7 times week, and stretching 10 minutes 7 times per week. Jamie [name] states that her weight remains the same (167). Her goal is to maintain her weight. We were unable to weigh the patient today for this TeleHealth visit. She feels as if she has maintained her weight since her last visit. She has lost 68 lbs since starting treatment with [name].  Hypertension Brittnei D Pardoe is a 73 y.o. female with hypertension, which is well controlled.  Nejla D Agan denies chest pain or shortness of breath on exertion. She is working weight loss to help control her blood pressure with the goal of decreasing her risk of heart attack and stroke. No lightheadedness.  Vitamin D deficiency Jamie [name] has a diagnosis of Vitamin D deficiency. Last Vitamin D 63.3 on 09/03/2018. She is currently taking OTC Vit D and denies nausea, vomiting or muscle weakness.  ASSESSMENT AND PLAN:  Essential hypertension  Vitamin D deficiency  Class 1 obesity with serious comorbidity and body mass index (BMI) of 30.0 to 30.9 in adult, unspecified obesity type - BMI greater than 30 at start of program  PLAN:  Hypertension We discussed sodium restriction, working on healthy weight loss, and a regular exercise program as the means to achieve improved blood pressure control. Howard agreed with this plan and  agreed to follow up as directed. We will continue to monitor her blood pressure as well as her progress with the above lifestyle modifications. She will continue her medications as prescribed and will watch for signs of hypotension as she continues her lifestyle modifications.  Vitamin D Deficiency Jamie [name] was informed that low Vitamin D levels contributes to fatigue and are associated with obesity, breast, and colon cancer. She agrees to continue taking OTC Vit D and will follow-up for routine testing of Vitamin D, at least 2-3 times per year. She was informed of the risk of over-replacement of Vitamin D and agrees to not increase her dose unless she discusses this with [name] first. [name] agrees to follow-up with our clinic in 2 weeks.  Obesity Jamie [name] is currently in the action stage of change. As such, her goal is to continue with weight loss efforts. She has agreed to follow the Category 2 plan. [name] will work on meal planning, intentional eating, and increasing protein. [name] has been instructed to work up to a goal of 150 minutes of combined cardio and strengthening exercise per week for weight loss and overall health benefits. We discussed the following Behavioral Modification Strategies today: increasing lean protein intake, decreasing simple carbohydrates, increasing vegetables, increase H20 intake, decrease eating out, no skipping meals, work on meal planning and easy cooking plans, keeping healthy foods in the home, and planning for success.  Jamie [name] has agreed to follow-up with our clinic in 2 weeks. She was informed of the importance of frequent follow-up visits to maximize her success  with intensive lifestyle modifications for her multiple health conditions.  ALLERGIES: Allergies  Allergen Reactions  . Buprenorphine Hcl Rash and Shortness Of Breath  . Morphine And Related Shortness Of Breath and Rash  . Morphine And Related Shortness Of Breath  . Aspirin Other (See Comments)    REACTION:  ulcers  Can take 81 mg  . Avelox [Moxifloxacin Hcl In Nacl] Hives  . Diflucan [Fluconazole] Hives    Whelps and rash c skin peeling  . Diflucan [Fluconazole] Hives  . Latex Rash  . Moxifloxacin Palpitations    Tachycardia     MEDICATIONS: Current Outpatient Medications on File Prior to Visit  Medication Sig Dispense Refill  . Ascorbic Acid (VITAMIN C) 1000 MG tablet Take 1,000 mg by mouth daily.     Marland Kitchen aspirin EC 81 MG tablet Take 1 tablet (81 mg total) by mouth daily. 90 tablet 3  . BIOTIN PO Take 1 capsule by mouth daily.     . Cholecalciferol (VITAMIN D3) 2000 units TABS Take 2,000 Units by mouth daily.     . clidinium-chlordiazePOXIDE (LIBRAX) 5-2.5 MG capsule Take 1 capsule by mouth 2 (two) times daily. 180 capsule 3  . clonazePAM (KLONOPIN) 1 MG tablet Take 0.5 mg by mouth daily as needed (restless leg syndrome).     . conjugated estrogens (PREMARIN) vaginal cream Place 1 Applicatorful vaginally every other day.     . diphenhydrAMINE (BENADRYL) 25 MG tablet Take 25 mg by mouth daily as needed for allergies.     Marland Kitchen esomeprazole (NEXIUM) 40 MG capsule TAKE 1 CAPSULE TWICE A DAY BEFORE MEALS 180 capsule 3  . linaclotide (LINZESS) 145 MCG CAPS capsule Take 1 capsule (145 mcg total) by mouth daily. 30 capsule 0  . metaxalone (SKELAXIN) 800 MG tablet Take 400 mg by mouth daily as needed for muscle spasms.    . metFORMIN (GLUCOPHAGE) 500 MG tablet Take 1 tablet (500 mg total) by mouth daily with breakfast. 60 tablet 0  . Omega-3-6-9 CAPS Take 1 capsule by mouth every evening.    . Prenatal Vit-Fe Fumarate-FA (PRENATAL MULTIVITAMIN) TABS tablet Take 1 tablet by mouth every evening.     . Probiotic Product (PROBIOTIC-10 PO) Take 1 capsule by mouth 2 (two) times daily.     Marland Kitchen spironolactone (ALDACTONE) 50 MG tablet Take 50 mg by mouth daily.      No current facility-administered medications on file prior to visit.     PAST MEDICAL HISTORY: Past Medical History:  Diagnosis Date  .  Allergic rhinitis   . Anxiety   . Arthritis    left foot  . Asthma   . Back pain   . Colon polyps    adenomatous  . Constipation   . Diverticulosis   . Dysrhythmia 1999   Dr Johnsie Cancel 2003  . Elevated LFTs   . Fatty liver disease, nonalcoholic   . GERD (gastroesophageal reflux disease)   . Headache(784.0)    migraines with vision changes but no pain  . Heart murmur   . Hypertension   . IBS (irritable bowel syndrome)   . Joint pain   . Migraines   . Muscle cramps   . Obesity   . Palpitations   . Pneumonia 2007  . Renal cell carcinoma ([address]) 2013   minimal partial nephrectomy  . Restless leg syndrome   . Sleep apnea    moderate; wears CPAP, uncertain about setting  . TIA (transient ischemic attack) 09/11/2014  . Ulcer   . Urinary  incontinence   . Vitamin D deficiency     PAST SURGICAL HISTORY: Past Surgical History:  Procedure Laterality Date  . BREAST REDUCTION SURGERY  Jan 1991  . ELBOW SURGERY Left   . KIDNEY SURGERY Right October 2013    Tumor removed   . LAPAROSCOPIC APPENDECTOMY N/A 06/08/2018   Procedure: APPENDECTOMY LAPAROSCOPIC;  Surgeon: Jovita Kussmaul, MD;  Location: [facility];  Service: General;  Laterality: N/A;  . TONSILLECTOMY    . VAGINAL HYSTERECTOMY  1979   partial    SOCIAL HISTORY: Social History   Tobacco Use  . Smoking status: Never Smoker  . Smokeless tobacco: Never Used  Substance Use Topics  . Alcohol use: Yes    Alcohol/week: 2.0 - 3.0 standard drinks    Types: 1 - 2 Glasses of wine, 1 Shots of liquor per week    Comment: couple of times a week  . Drug use: No    FAMILY HISTORY: Family History  Problem Relation Age of Onset  . Heart disease Father 59  . Lung cancer Father        smoker  . Hypertension Mother   . Anxiety disorder Mother   . Heart disease Brother   . Hypertension Other   . Colon cancer Cousin   . Allergies Daughter   . Allergies Brother   . Melanoma Daughter   . Lung cancer Sister   . Liver disease Neg Hx    . Asthma Neg Hx   . Esophageal cancer Neg Hx   . Rectal cancer Neg Hx   . Stomach cancer Neg Hx    ROS: Review of Systems  Respiratory: Negative for shortness of breath.   Cardiovascular: Negative for chest pain.  Gastrointestinal: Negative for nausea and vomiting.  Musculoskeletal:       Negative for muscle weakness.  Neurological:       Negative for lightheadedness.   PHYSICAL EXAM: Pt in no acute distress  RECENT LABS AND TESTS: BMET    Component Value Date/Time   NA 141 09/03/2018 1542   K 5.1 09/03/2018 1542   CL 101 09/03/2018 1542   CO2 23 09/03/2018 1542   GLUCOSE 85 09/03/2018 1542   GLUCOSE 135 (H) 06/09/2018 0456   BUN 22 09/03/2018 1542   CREATININE 0.81 09/03/2018 1542   CALCIUM 11.1 (H) 09/03/2018 1542   GFRNONAA 74 09/03/2018 1542   GFRAA 85 09/03/2018 1542   Lab Results  Component Value Date   HGBA1C 5.1 09/03/2018   HGBA1C 5.2 04/01/2018   HGBA1C 5.4 08/25/2017   HGBA1C 5.4 12/16/2011   HGBA1C 5.7 05/03/2011   Lab Results  Component Value Date   INSULIN 5.7 09/03/2018   INSULIN 5.6 04/01/2018   INSULIN 50.3 (H) 08/25/2017   CBC    Component Value Date/Time   WBC 8.1 06/09/2018 0456   RBC 3.71 (L) 06/09/2018 0456   HGB 11.6 (L) 06/09/2018 0456   HGB 12.7 08/25/2017 1047   HCT 36.6 06/09/2018 0456   HCT 37.0 08/25/2017 1047   PLT 193 06/09/2018 0456   MCV 98.7 06/09/2018 0456   MCV 93 08/25/2017 1047   MCH 31.3 06/09/2018 0456   MCHC 31.7 06/09/2018 0456   RDW 13.3 06/09/2018 0456   RDW 13.2 08/25/2017 1047   LYMPHSABS 1.4 11/06/2017 1726   LYMPHSABS 1.2 08/25/2017 1047   MONOABS 0.6 11/06/2017 1726   EOSABS 0.1 11/06/2017 1726   EOSABS 0.1 08/25/2017 1047   BASOSABS 0.0 11/06/2017 1726   BASOSABS  0.0 08/25/2017 1047   Iron/TIBC/Ferritin/ %Sat No results found for: IRON, TIBC, FERRITIN, IRONPCTSAT Lipid Panel     Component Value Date/Time   CHOL 176 08/25/2017 1047   TRIG 91 08/25/2017 1047   HDL 84 08/25/2017 1047    CHOLHDL 3 08/24/2010 1134   VLDL 17.0 08/24/2010 1134   LDLCALC 74 08/25/2017 1047   Hepatic Function Panel     Component Value Date/Time   PROT 7.1 09/03/2018 1542   ALBUMIN 4.8 09/03/2018 1542   AST 17 09/03/2018 1542   ALT 16 09/03/2018 1542   ALKPHOS 55 09/03/2018 1542   BILITOT 0.4 09/03/2018 1542   BILIDIR 0.2 08/24/2010 1134      Component Value Date/Time   TSH 1.900 08/25/2017 1047   TSH 1.12 05/03/2011 1239   TSH 1.34 08/24/2010 1134   Results for Hamil, Citlalic D "[name]" (MRN [id number]) as of 04/06/2019 16:34  Ref. Range 09/03/2018 15:42  Vitamin D, 25-Hydroxy Latest Ref Range: 30.0 - 100.0 ng/mL 63.3   I, Michaelene Song, am acting as Location manager for [employer], DO  I have reviewed the above documentation for accuracy and completeness, and I agree with the above. -Jearld Lesch, DO

## 2019-04-20 ENCOUNTER — Encounter (INDEPENDENT_AMBULATORY_CARE_PROVIDER_SITE_OTHER): Payer: Self-pay | Admitting: Bariatrics

## 2019-04-20 ENCOUNTER — Telehealth (INDEPENDENT_AMBULATORY_CARE_PROVIDER_SITE_OTHER): Payer: Medicare Other | Admitting: Bariatrics

## 2019-04-20 ENCOUNTER — Other Ambulatory Visit: Payer: Self-pay

## 2019-04-20 DIAGNOSIS — E669 Obesity, unspecified: Secondary | ICD-10-CM | POA: Diagnosis not present

## 2019-04-20 DIAGNOSIS — E8881 Metabolic syndrome: Secondary | ICD-10-CM | POA: Diagnosis not present

## 2019-04-20 DIAGNOSIS — I10 Essential (primary) hypertension: Secondary | ICD-10-CM

## 2019-04-20 DIAGNOSIS — Z6833 Body mass index (BMI) 33.0-33.9, adult: Secondary | ICD-10-CM | POA: Diagnosis not present

## 2019-04-20 MED ORDER — METFORMIN HCL 500 MG PO TABS: 500.0000 mg | ORAL_TABLET | Freq: Every day | ORAL | 0 refills | Status: DC

## 2019-04-21 NOTE — Progress Notes (Signed)
Office: [phone]  /  Fax: [phone] TeleHealth Visit:  Jamie [name] has verbally consented to this TeleHealth visit today. The patient is located at home, the provider is located at the [facility] office. The participants in this visit include the listed provider and patient. The visit was conducted today via face time.  HPI:   Chief Complaint: OBESITY Jamie [name] is here to discuss her progress with her obesity treatment plan. She is on the Category 2 plan and is following her eating plan approximately 50 % of the time. She states she is on the treadmill for 30 minutes 2-3 times per week. Jamie [name] states that her weight is up 2 lbs (weight of 169 lbs). She is feeling much better.  We were unable to weigh the patient today for this TeleHealth visit. She feels as if she has gained 2 lbs since her last visit. She has lost 68 lbs since starting treatment with [name].  Insulin Resistance Jamie [name] has a diagnosis of insulin resistance based on her elevated fasting insulin level >5. Last A1c was 5.1 and insulin of 5.7 (essentually resistant). Although Krystena's blood glucose readings are still under good control, insulin resistance puts her at greater risk of metabolic syndrome and diabetes. She is taking metformin currently and continues to work on diet and exercise to decrease risk of diabetes.  Hypertension Jamie [name] is a 73 y.o. female with hypertension. Jamie [name]'s blood pressure is well controlled. She denies lightheadedness. She is working on weight loss to help control her blood pressure with the goal of decreasing her risk of heart attack and stroke.   ASSESSMENT AND PLAN:  Essential hypertension  Insulin resistance - Plan: metFORMIN (GLUCOPHAGE) 500 MG tablet  Class 1 obesity with serious comorbidity and body mass index (BMI) of 33.0 to 33.9 in adult, unspecified obesity type  PLAN:  Insulin Resistance Jamie [name] will continue to work on weight loss, exercise, increase  protein, healthy fats, and decreasing simple carbohydrates in her diet to help decrease the risk of diabetes. We dicussed metformin including benefits and risks. She was informed that eating too many simple carbohydrates or too many calories at one sitting increases the likelihood of GI side effects. Jamie [name] agrees to continue taking metformin 500 mg 1 tablet PO daily #30 and we will refill for 1 month. Jamie [name] agrees to follow up with our clinic in 2 weeks as directed to monitor her progress.  Hypertension We discussed sodium restriction, working on healthy weight loss, and a regular exercise program as the means to achieve improved blood pressure control. Tanji agreed with this plan and agreed to follow up as directed. We will continue to monitor her blood pressure as well as her progress with the above lifestyle modifications. Jamie [name] agrees to continue her medications and will watch for signs of hypotension as she continues her lifestyle modifications. Jamie [name] agrees to follow up with our clinic in 2 weeks.  Obesity Jamie [name] is currently in the action stage of change. As such, her goal is to continue with weight loss efforts She has agreed to follow the Category 2 plan Jamie [name] has been instructed to work up to a goal of 150 minutes of combined cardio and strengthening exercise per week or she will increase current exercise (increase work on the treadmill and get some 5 lb weights) for weight loss and overall health benefits. We discussed the following Behavioral Modification Strategies today: increasing lean protein intake, decreasing simple carbohydrates, increasing vegetables, decrease eating out, increase H20 intake,  no skipping meals, work on meal planning and easy cooking plans, keeping healthy foods in the home, and planning for success   Jamie [name] has agreed to follow up with our clinic in 2 weeks. She was informed of the importance of frequent follow up visits to maximize her success with intensive lifestyle  modifications for her multiple health conditions.  ALLERGIES: Allergies  Allergen Reactions  . Buprenorphine Hcl Rash and Shortness Of Breath  . Morphine And Related Shortness Of Breath and Rash  . Morphine And Related Shortness Of Breath  . Aspirin Other (See Comments)    REACTION: ulcers  Can take 81 mg  . Avelox [Moxifloxacin Hcl In Nacl] Hives  . Diflucan [Fluconazole] Hives    Whelps and rash c skin peeling  . Diflucan [Fluconazole] Hives  . Latex Rash  . Moxifloxacin Palpitations    Tachycardia     MEDICATIONS: Current Outpatient Medications on File Prior to Visit  Medication Sig Dispense Refill  . Ascorbic Acid (VITAMIN C) 1000 MG tablet Take 1,000 mg by mouth daily.     Marland Kitchen aspirin EC 81 MG tablet Take 1 tablet (81 mg total) by mouth daily. 90 tablet 3  . BIOTIN PO Take 1 capsule by mouth daily.     . Cholecalciferol (VITAMIN D3) 2000 units TABS Take 2,000 Units by mouth daily.     . clidinium-chlordiazePOXIDE (LIBRAX) 5-2.5 MG capsule Take 1 capsule by mouth 2 (two) times daily. 180 capsule 3  . clonazePAM (KLONOPIN) 1 MG tablet Take 0.5 mg by mouth daily as needed (restless leg syndrome).     . conjugated estrogens (PREMARIN) vaginal cream Place 1 Applicatorful vaginally every other day.     . diphenhydrAMINE (BENADRYL) 25 MG tablet Take 25 mg by mouth daily as needed for allergies.     Marland Kitchen esomeprazole (NEXIUM) 40 MG capsule TAKE 1 CAPSULE TWICE A DAY BEFORE MEALS 180 capsule 3  . linaclotide (LINZESS) 145 MCG CAPS capsule Take 1 capsule (145 mcg total) by mouth daily. 30 capsule 0  . metaxalone (SKELAXIN) 800 MG tablet Take 400 mg by mouth daily as needed for muscle spasms.    . Omega-3-6-9 CAPS Take 1 capsule by mouth every evening.    . Prenatal Vit-Fe Fumarate-FA (PRENATAL MULTIVITAMIN) TABS tablet Take 1 tablet by mouth every evening.     . Probiotic Product (PROBIOTIC-10 PO) Take 1 capsule by mouth 2 (two) times daily.     Marland Kitchen spironolactone (ALDACTONE) 50 MG tablet  Take 50 mg by mouth daily.      No current facility-administered medications on file prior to visit.     PAST MEDICAL HISTORY: Past Medical History:  Diagnosis Date  . Allergic rhinitis   . Anxiety   . Arthritis    left foot  . Asthma   . Back pain   . Colon polyps    adenomatous  . Constipation   . Diverticulosis   . Dysrhythmia 1999   Dr Johnsie Cancel 2003  . Elevated LFTs   . Fatty liver disease, nonalcoholic   . GERD (gastroesophageal reflux disease)   . Headache(784.0)    migraines with vision changes but no pain  . Heart murmur   . Hypertension   . IBS (irritable bowel syndrome)   . Joint pain   . Migraines   . Muscle cramps   . Obesity   . Palpitations   . Pneumonia 2007  . Renal cell carcinoma ([place]) 2013   minimal partial nephrectomy  . Restless leg  syndrome   . Sleep apnea    moderate; wears CPAP, uncertain about setting  . TIA (transient ischemic attack) 09/11/2014  . Ulcer   . Urinary incontinence   . Vitamin D deficiency     PAST SURGICAL HISTORY: Past Surgical History:  Procedure Laterality Date  . BREAST REDUCTION SURGERY  Jan 1991  . ELBOW SURGERY Left   . KIDNEY SURGERY Right October 2013    Tumor removed   . LAPAROSCOPIC APPENDECTOMY N/A 06/08/2018   Procedure: APPENDECTOMY LAPAROSCOPIC;  Surgeon: Jovita Kussmaul, MD;  Location: [facility];  Service: General;  Laterality: N/A;  . TONSILLECTOMY    . VAGINAL HYSTERECTOMY  1979   partial    SOCIAL HISTORY: Social History   Tobacco Use  . Smoking status: Never Smoker  . Smokeless tobacco: Never Used  Substance Use Topics  . Alcohol use: Yes    Alcohol/week: 2.0 - 3.0 standard drinks    Types: 1 - 2 Glasses of wine, 1 Shots of liquor per week    Comment: couple of times a week  . Drug use: No    FAMILY HISTORY: Family History  Problem Relation Age of Onset  . Heart disease Father 40  . Lung cancer Father        smoker  . Hypertension Mother   . Anxiety disorder Mother   . Heart disease  Brother   . Hypertension Other   . Colon cancer Cousin   . Allergies Daughter   . Allergies Brother   . Melanoma Daughter   . Lung cancer Sister   . Liver disease Neg Hx   . Asthma Neg Hx   . Esophageal cancer Neg Hx   . Rectal cancer Neg Hx   . Stomach cancer Neg Hx     ROS: Review of Systems  Constitutional: Negative for weight loss.  Neurological:       Negative lightheadedness    PHYSICAL EXAM: Pt in no acute distress  RECENT LABS AND TESTS: BMET    Component Value Date/Time   NA 141 09/03/2018 1542   K 5.1 09/03/2018 1542   CL 101 09/03/2018 1542   CO2 23 09/03/2018 1542   GLUCOSE 85 09/03/2018 1542   GLUCOSE 135 (H) 06/09/2018 0456   BUN 22 09/03/2018 1542   CREATININE 0.81 09/03/2018 1542   CALCIUM 11.1 (H) 09/03/2018 1542   GFRNONAA 74 09/03/2018 1542   GFRAA 85 09/03/2018 1542   Lab Results  Component Value Date   HGBA1C 5.1 09/03/2018   HGBA1C 5.2 04/01/2018   HGBA1C 5.4 08/25/2017   HGBA1C 5.4 12/16/2011   HGBA1C 5.7 05/03/2011   Lab Results  Component Value Date   INSULIN 5.7 09/03/2018   INSULIN 5.6 04/01/2018   INSULIN 50.3 (H) 08/25/2017   CBC    Component Value Date/Time   WBC 8.1 06/09/2018 0456   RBC 3.71 (L) 06/09/2018 0456   HGB 11.6 (L) 06/09/2018 0456   HGB 12.7 08/25/2017 1047   HCT 36.6 06/09/2018 0456   HCT 37.0 08/25/2017 1047   PLT 193 06/09/2018 0456   MCV 98.7 06/09/2018 0456   MCV 93 08/25/2017 1047   MCH 31.3 06/09/2018 0456   MCHC 31.7 06/09/2018 0456   RDW 13.3 06/09/2018 0456   RDW 13.2 08/25/2017 1047   LYMPHSABS 1.4 11/06/2017 1726   LYMPHSABS 1.2 08/25/2017 1047   MONOABS 0.6 11/06/2017 1726   EOSABS 0.1 11/06/2017 1726   EOSABS 0.1 08/25/2017 1047   BASOSABS 0.0 11/06/2017 1726  BASOSABS 0.0 08/25/2017 1047   Iron/TIBC/Ferritin/ %Sat No results found for: IRON, TIBC, FERRITIN, IRONPCTSAT Lipid Panel     Component Value Date/Time   CHOL 176 08/25/2017 1047   TRIG 91 08/25/2017 1047   HDL 84  08/25/2017 1047   CHOLHDL 3 08/24/2010 1134   VLDL 17.0 08/24/2010 1134   LDLCALC 74 08/25/2017 1047   Hepatic Function Panel     Component Value Date/Time   PROT 7.1 09/03/2018 1542   ALBUMIN 4.8 09/03/2018 1542   AST 17 09/03/2018 1542   ALT 16 09/03/2018 1542   ALKPHOS 55 09/03/2018 1542   BILITOT 0.4 09/03/2018 1542   BILIDIR 0.2 08/24/2010 1134      Component Value Date/Time   TSH 1.900 08/25/2017 1047   TSH 1.12 05/03/2011 1239   TSH 1.34 08/24/2010 1134      I, Trixie Dredge, am acting as Location manager for [employer], DO  I have reviewed the above documentation for accuracy and completeness, and I agree with the above. Jearld Lesch, DO

## 2019-04-29 ENCOUNTER — Other Ambulatory Visit (INDEPENDENT_AMBULATORY_CARE_PROVIDER_SITE_OTHER): Payer: Self-pay | Admitting: Bariatrics

## 2019-04-29 DIAGNOSIS — E8881 Metabolic syndrome: Secondary | ICD-10-CM

## 2019-05-06 ENCOUNTER — Other Ambulatory Visit: Payer: Self-pay

## 2019-05-06 ENCOUNTER — Ambulatory Visit (INDEPENDENT_AMBULATORY_CARE_PROVIDER_SITE_OTHER): Payer: Medicare Other | Admitting: Bariatrics

## 2019-05-06 ENCOUNTER — Encounter (INDEPENDENT_AMBULATORY_CARE_PROVIDER_SITE_OTHER): Payer: Self-pay | Admitting: Bariatrics

## 2019-05-06 DIAGNOSIS — Z6830 Body mass index (BMI) 30.0-30.9, adult: Secondary | ICD-10-CM

## 2019-05-06 DIAGNOSIS — E8881 Metabolic syndrome: Secondary | ICD-10-CM

## 2019-05-06 DIAGNOSIS — E669 Obesity, unspecified: Secondary | ICD-10-CM | POA: Diagnosis not present

## 2019-05-06 DIAGNOSIS — K760 Fatty (change of) liver, not elsewhere classified: Secondary | ICD-10-CM

## 2019-05-06 MED ORDER — METFORMIN HCL 500 MG PO TABS: 500.0000 mg | ORAL_TABLET | Freq: Every day | ORAL | 0 refills | Status: DC

## 2019-05-10 NOTE — Progress Notes (Signed)
Office: [phone]  /  Fax: [phone] TeleHealth Visit:  Jamie [name] has verbally consented to this TeleHealth visit today. The patient is located at home, the provider is located at the [employer] [facility] office. The participants in this visit include the listed provider and patient. Sherie was unable to use realtime audiovisual technology today and the telehealth visit was conducted via telephone (20 minutes).   HPI:   Chief Complaint: OBESITY Ellana is here to discuss her progress with her obesity treatment plan. She is on the Category 2 plan and is following her eating plan approximately 50 % of the time. She states she is walking for 15 minutes 1 time per week. Adin states that her weight remains the same.  We were unable to weigh the patient today for this TeleHealth visit. She feels as if she has maintained her weight since her last visit. She has lost 68 lbs since starting treatment with [name].  Insulin Resistance Lavone has a diagnosis of insulin resistance based on her elevated fasting insulin level >5. Her last level has improved. Although Ghada's blood glucose readings are still under good control, insulin resistance puts her at greater risk of metabolic syndrome and diabetes. She is taking metformin currently and denies polyphagia. She continues to work on diet and exercise to decrease risk of diabetes.  Non Alcoholic Fatty Liver Disease (NASH) Bitha has a diagnosis of NAFLD. She denies abdominal pain or jaundice and has never been told of any liver problems in the past. She denies excessive alcohol intake.  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 We dicussed metformin including benefits and risks. She was informed that eating too many simple carbohydrates or too many calories at one sitting increases the likelihood of GI side effects. Oona agrees to continue taking metformin 1 tablet PO q AM with breakfast #60 with no refills. Ryana agrees to follow up with our clinic in 2 to 3 weeks as directed to monitor her progress.  Non Alcoholic Fatty Liver Disease (NASH) We discussed the likely diagnosis of non alcoholic fatty liver disease today and how this condition is obesity related. Saman was educated on her risk of developing NASH or even liver failure and th only proven treatment for NAFLD was weight loss. Jonathan agreed to continue with her weight loss efforts with healthier diet and exercise as an essential part of her treatment plan, and she is to lose 5-10 % of her body fat.   Obesity Orvilla is currently in the action stage of change. As such, her goal is to continue with weight loss efforts She has agreed to follow the Category 2 plan Dashana has been instructed to work up to a goal of 150 minutes of combined cardio and strengthening exercise per week for weight loss and overall health benefits. We discussed the following Behavioral Modification Strategies today: increasing lean protein intake, decreasing simple carbohydrates, increasing vegetables, increasing fiber rich foods, decreasing sodium intake, decrease eating out, work on meal planning and easy cooking plans, ways to avoid night time snacking, increase H20 intake,  no skipping meals, keeping healthy foods in the home, and planning for success Dani will have greater adherence to the meal plan. She is to increase protein(Premier Protein), 160 calories 1 gram of carbohydrate  Jleigh has agreed to follow up with our clinic in 2 to 3 weeks. She was informed of the  importance of frequent follow up visits to maximize her success with intensive lifestyle modifications for her multiple health conditions.  ALLERGIES: Allergies  Allergen Reactions  . Buprenorphine Hcl Rash and Shortness Of Breath  . Morphine And Related Shortness Of Breath and Rash  . Morphine And Related Shortness Of Breath  . Aspirin Other (See Comments)    REACTION: ulcers  Can take 81 mg  . Avelox [Moxifloxacin Hcl In Nacl] Hives  . Diflucan [Fluconazole] Hives    Whelps and rash c skin peeling  . Diflucan [Fluconazole] Hives  . Latex Rash  . Moxifloxacin Palpitations    Tachycardia     MEDICATIONS: Current Outpatient Medications on File Prior to Visit  Medication Sig Dispense Refill  . Ascorbic Acid (VITAMIN C) 1000 MG tablet Take 1,000 mg by mouth daily.     Marland Kitchen aspirin EC 81 MG tablet Take 1 tablet (81 mg total) by mouth daily. 90 tablet 3  . BIOTIN PO Take 1 capsule by mouth daily.     . Cholecalciferol (VITAMIN D3) 2000 units TABS Take 2,000 Units by mouth daily.     . clidinium-chlordiazePOXIDE (LIBRAX) 5-2.5 MG capsule Take 1 capsule by mouth 2 (two) times daily. 180 capsule 3  . clonazePAM (KLONOPIN) 1 MG tablet Take 0.5 mg by mouth daily as needed (restless leg syndrome).     . conjugated estrogens (PREMARIN) vaginal cream Place 1 Applicatorful vaginally every other day.     . diphenhydrAMINE (BENADRYL) 25 MG tablet Take 25 mg by mouth daily as needed for allergies.     Marland Kitchen esomeprazole (NEXIUM) 40 MG capsule TAKE 1 CAPSULE TWICE A DAY BEFORE MEALS 180 capsule 3  . linaclotide (LINZESS) 145 MCG CAPS capsule Take 1 capsule (145 mcg total) by mouth daily. 30 capsule 0  . metaxalone (SKELAXIN) 800 MG tablet Take 400 mg by mouth daily as needed for muscle spasms.    . Omega-3-6-9 CAPS Take 1 capsule by mouth every evening.    . Prenatal Vit-Fe Fumarate-FA (PRENATAL MULTIVITAMIN) TABS tablet Take 1 tablet by mouth every evening.     . Probiotic Product (PROBIOTIC-10 PO)  Take 1 capsule by mouth 2 (two) times daily.     Marland Kitchen spironolactone (ALDACTONE) 50 MG tablet Take 50 mg by mouth daily.      No current facility-administered medications on file prior to visit.     PAST MEDICAL HISTORY: Past Medical History:  Diagnosis Date  . Allergic rhinitis   . Anxiety   . Arthritis    left foot  . Asthma   . Back pain   . Colon polyps    adenomatous  . Constipation   . Diverticulosis   . Dysrhythmia 1999   Dr Johnsie Cancel 2003  . Elevated LFTs   . Fatty liver disease, nonalcoholic   . GERD (gastroesophageal reflux disease)   . Headache(784.0)    migraines with vision changes but no pain  . Heart murmur   . Hypertension   . IBS (irritable bowel syndrome)   . Joint pain   . Migraines   . Muscle cramps   . Obesity   . Palpitations   . Pneumonia 2007  .  Renal cell carcinoma ([place]) 2013   minimal partial nephrectomy  . Restless leg syndrome   . Sleep apnea    moderate; wears CPAP, uncertain about setting  . TIA (transient ischemic attack) 09/11/2014  . Ulcer   . Urinary incontinence   . Vitamin D deficiency     PAST SURGICAL HISTORY: Past Surgical History:  Procedure Laterality Date  . BREAST REDUCTION SURGERY  Jan 1991  . ELBOW SURGERY Left   . KIDNEY SURGERY Right October 2013    Tumor removed   . LAPAROSCOPIC APPENDECTOMY N/A 06/08/2018   Procedure: APPENDECTOMY LAPAROSCOPIC;  Surgeon: Jovita Kussmaul, MD;  Location: [facility];  Service: General;  Laterality: N/A;  . TONSILLECTOMY    . VAGINAL HYSTERECTOMY  1979   partial    SOCIAL HISTORY: Social History   Tobacco Use  . Smoking status: Never Smoker  . Smokeless tobacco: Never Used  Substance Use Topics  . Alcohol use: Yes    Alcohol/week: 2.0 - 3.0 standard drinks    Types: 1 - 2 Glasses of wine, 1 Shots of liquor per week    Comment: couple of times a week  . Drug use: No    FAMILY HISTORY: Family History  Problem Relation Age of Onset  . Heart disease Father 17  . Lung cancer  Father        smoker  . Hypertension Mother   . Anxiety disorder Mother   . Heart disease Brother   . Hypertension Other   . Colon cancer Cousin   . Allergies Daughter   . Allergies Brother   . Melanoma Daughter   . Lung cancer Sister   . Liver disease Neg Hx   . Asthma Neg Hx   . Esophageal cancer Neg Hx   . Rectal cancer Neg Hx   . Stomach cancer Neg Hx     ROS: Review of Systems  Constitutional: Negative for weight loss.  Eyes:       Negative jaundice  Gastrointestinal: Negative for abdominal pain.  Endo/Heme/Allergies:       Negative polyphagia    PHYSICAL EXAM: Pt in no acute distress  RECENT LABS AND TESTS: BMET    Component Value Date/Time   NA 141 09/03/2018 1542   K 5.1 09/03/2018 1542   CL 101 09/03/2018 1542   CO2 23 09/03/2018 1542   GLUCOSE 85 09/03/2018 1542   GLUCOSE 135 (H) 06/09/2018 0456   BUN 22 09/03/2018 1542   CREATININE 0.81 09/03/2018 1542   CALCIUM 11.1 (H) 09/03/2018 1542   GFRNONAA 74 09/03/2018 1542   GFRAA 85 09/03/2018 1542   Lab Results  Component Value Date   HGBA1C 5.1 09/03/2018   HGBA1C 5.2 04/01/2018   HGBA1C 5.4 08/25/2017   HGBA1C 5.4 12/16/2011   HGBA1C 5.7 05/03/2011   Lab Results  Component Value Date   INSULIN 5.7 09/03/2018   INSULIN 5.6 04/01/2018   INSULIN 50.3 (H) 08/25/2017   CBC    Component Value Date/Time   WBC 8.1 06/09/2018 0456   RBC 3.71 (L) 06/09/2018 0456   HGB 11.6 (L) 06/09/2018 0456   HGB 12.7 08/25/2017 1047   HCT 36.6 06/09/2018 0456   HCT 37.0 08/25/2017 1047   PLT 193 06/09/2018 0456   MCV 98.7 06/09/2018 0456   MCV 93 08/25/2017 1047   MCH 31.3 06/09/2018 0456   MCHC 31.7 06/09/2018 0456   RDW 13.3 06/09/2018 0456   RDW 13.2 08/25/2017 1047   LYMPHSABS 1.4 11/06/2017 1726  LYMPHSABS 1.2 08/25/2017 1047   MONOABS 0.6 11/06/2017 1726   EOSABS 0.1 11/06/2017 1726   EOSABS 0.1 08/25/2017 1047   BASOSABS 0.0 11/06/2017 1726   BASOSABS 0.0 08/25/2017 1047    Iron/TIBC/Ferritin/ %Sat No results found for: IRON, TIBC, FERRITIN, IRONPCTSAT Lipid Panel     Component Value Date/Time   CHOL 176 08/25/2017 1047   TRIG 91 08/25/2017 1047   HDL 84 08/25/2017 1047   CHOLHDL 3 08/24/2010 1134   VLDL 17.0 08/24/2010 1134   LDLCALC 74 08/25/2017 1047   Hepatic Function Panel     Component Value Date/Time   PROT 7.1 09/03/2018 1542   ALBUMIN 4.8 09/03/2018 1542   AST 17 09/03/2018 1542   ALT 16 09/03/2018 1542   ALKPHOS 55 09/03/2018 1542   BILITOT 0.4 09/03/2018 1542   BILIDIR 0.2 08/24/2010 1134      Component Value Date/Time   TSH 1.900 08/25/2017 1047   TSH 1.12 05/03/2011 1239   TSH 1.34 08/24/2010 1134      I, Trixie Dredge, am acting as Location manager for [employer], DO  I have reviewed the above documentation for accuracy and completeness, and I agree with the above. Jearld Lesch, DO

## 2019-06-02 ENCOUNTER — Other Ambulatory Visit: Payer: Self-pay

## 2019-06-07 ENCOUNTER — Other Ambulatory Visit: Payer: Self-pay

## 2019-06-07 ENCOUNTER — Telehealth (INDEPENDENT_AMBULATORY_CARE_PROVIDER_SITE_OTHER): Payer: Medicare Other | Admitting: Bariatrics

## 2019-06-07 DIAGNOSIS — I10 Essential (primary) hypertension: Secondary | ICD-10-CM | POA: Diagnosis not present

## 2019-06-07 DIAGNOSIS — E669 Obesity, unspecified: Secondary | ICD-10-CM | POA: Diagnosis not present

## 2019-06-07 DIAGNOSIS — E8881 Metabolic syndrome: Secondary | ICD-10-CM

## 2019-06-07 DIAGNOSIS — Z6833 Body mass index (BMI) 33.0-33.9, adult: Secondary | ICD-10-CM | POA: Diagnosis not present

## 2019-06-07 MED ORDER — METFORMIN HCL 500 MG PO TABS: 500.0000 mg | ORAL_TABLET | Freq: Every day | ORAL | 0 refills | Status: DC

## 2019-06-07 NOTE — Progress Notes (Signed)
Office: [phone]  /  Fax: [phone] TeleHealth Visit:  Jamie [name] has verbally consented to this TeleHealth visit today. The patient is located at home, the provider is located at the [facility] office. The participants in this visit include the listed provider and patient. The visit was conducted today via FaceTime.  HPI:   Chief Complaint: OBESITY Jamie [name] is here to discuss her progress with her obesity treatment plan. She is on the Category 2 plan and is following her eating plan approximately 50% of the time. She states she is walking/treadmill/weights 20 minutes 3-4 times per week. Akyra states that she is up 1 lb (weight 170). She had a "quiet" holiday. She did not overeat over the holidays. She states she wants to maintain her weight at this time. We were unable to weigh the patient today for this TeleHealth visit. She feels as if she has gained 1 lb since her last visit. She has lost 68 lbs since starting treatment with [name].  Hypertension Jamie [name] is a 73 y.o. female with hypertension and is taking Aldactone. Jamie [name] denies chest pain or shortness of breath on exertion. She is working weight loss to help control her blood pressure with the goal of decreasing her risk of heart attack and stroke. Garland's blood pressure is well controlled.   Insulin Resistance Jamie [name] has a diagnosis of insulin resistance based on her elevated fasting insulin level >5. Although Halea's blood glucose readings are still under good control, insulin resistance puts her at greater risk of metabolic syndrome and diabetes. She is taking metformin currently and continues to work on diet and exercise to decrease risk of diabetes. No polyphagia.  ASSESSMENT AND PLAN:  Insulin resistance - Plan: metFORMIN (GLUCOPHAGE) 500 MG tablet  Essential hypertension  Class 1 obesity with serious comorbidity and body mass index (BMI) of 33.0 to 33.9 in adult, unspecified obesity type   PLAN:  Hypertension We discussed sodium restriction, working on healthy weight loss, and a regular exercise program as the means to achieve improved blood pressure control. Jamie [name] agreed with this plan and agreed to follow up as directed. We will continue to monitor her blood pressure as well as her progress with the above lifestyle modifications. She will continue her medications as prescribed and will watch for signs of hypotension as she continues her lifestyle modifications.  Insulin Resistance Jamie [name] will continue to work on weight loss, exercise, and decreasing simple carbohydrates in her diet to help decrease the risk of diabetes. We dicussed metformin including benefits and risks. She was informed that eating too many simple carbohydrates or too many calories at one sitting increases the likelihood of GI side effects. Jamie [name] was given a prescription for metformin 500 mg 1 PO daily with breakfast #60 with 0 refills. She agrees to follow-up with our clinic in 2-3 weeks.  Obesity Jamie [name] is currently in the action stage of change. As such, her goal is to maintain weight for now. She has agreed to follow the Category 2 plan. Jamie [name] will work on meal planning and increasing her protein (occasional protein shake). Jamie [name] has been instructed to continue activities (light weights and walking) for weight loss and overall health benefits. We discussed the following Behavioral Modification Strategies today: increasing lean protein intake, decreasing simple carbohydrates, increasing vegetables, increase H20 intake, decrease eating out, no skipping meals, work on meal planning and easy cooking plans, keeping healthy foods in the home, and planning for success.  Jamie [name] has agreed  to follow-up with our clinic in 2-3 weeks. She was informed of the importance of frequent follow-up visits to maximize her success with intensive lifestyle modifications for her multiple health conditions.  ALLERGIES: Allergies   Allergen Reactions  . Buprenorphine Hcl Rash and Shortness Of Breath  . Morphine And Related Shortness Of Breath and Rash  . Morphine And Related Shortness Of Breath  . Aspirin Other (See Comments)    REACTION: ulcers  Can take 81 mg  . Avelox [Moxifloxacin Hcl In Nacl] Hives  . Diflucan [Fluconazole] Hives    Whelps and rash c skin peeling  . Diflucan [Fluconazole] Hives  . Latex Rash  . Moxifloxacin Palpitations    Tachycardia     MEDICATIONS: Current Outpatient Medications on File Prior to Visit  Medication Sig Dispense Refill  . Ascorbic Acid (VITAMIN C) 1000 MG tablet Take 1,000 mg by mouth daily.     Marland Kitchen aspirin EC 81 MG tablet Take 1 tablet (81 mg total) by mouth daily. 90 tablet 3  . BIOTIN PO Take 1 capsule by mouth daily.     . Cholecalciferol (VITAMIN D3) 2000 units TABS Take 2,000 Units by mouth daily.     . clidinium-chlordiazePOXIDE (LIBRAX) 5-2.5 MG capsule Take 1 capsule by mouth 2 (two) times daily. 180 capsule 3  . clonazePAM (KLONOPIN) 1 MG tablet Take 0.5 mg by mouth daily as needed (restless leg syndrome).     . conjugated estrogens (PREMARIN) vaginal cream Place 1 Applicatorful vaginally every other day.     . diphenhydrAMINE (BENADRYL) 25 MG tablet Take 25 mg by mouth daily as needed for allergies.     Marland Kitchen esomeprazole (NEXIUM) 40 MG capsule TAKE 1 CAPSULE TWICE A DAY BEFORE MEALS 180 capsule 3  . linaclotide (LINZESS) 145 MCG CAPS capsule Take 1 capsule (145 mcg total) by mouth daily. 30 capsule 0  . metaxalone (SKELAXIN) 800 MG tablet Take 400 mg by mouth daily as needed for muscle spasms.    . Omega-3-6-9 CAPS Take 1 capsule by mouth every evening.    . Prenatal Vit-Fe Fumarate-FA (PRENATAL MULTIVITAMIN) TABS tablet Take 1 tablet by mouth every evening.     . Probiotic Product (PROBIOTIC-10 PO) Take 1 capsule by mouth 2 (two) times daily.     Marland Kitchen spironolactone (ALDACTONE) 50 MG tablet Take 50 mg by mouth daily.      No current facility-administered  medications on file prior to visit.     PAST MEDICAL HISTORY: Past Medical History:  Diagnosis Date  . Allergic rhinitis   . Anxiety   . Arthritis    left foot  . Asthma   . Back pain   . Colon polyps    adenomatous  . Constipation   . Diverticulosis   . Dysrhythmia 1999   Dr Johnsie Cancel 2003  . Elevated LFTs   . Fatty liver disease, nonalcoholic   . GERD (gastroesophageal reflux disease)   . Headache(784.0)    migraines with vision changes but no pain  . Heart murmur   . Hypertension   . IBS (irritable bowel syndrome)   . Joint pain   . Migraines   . Muscle cramps   . Obesity   . Palpitations   . Pneumonia 2007  . Renal cell carcinoma ([place]) 2013   minimal partial nephrectomy  . Restless leg syndrome   . Sleep apnea    moderate; wears CPAP, uncertain about setting  . TIA (transient ischemic attack) 09/11/2014  . Ulcer   .  Urinary incontinence   . Vitamin D deficiency     PAST SURGICAL HISTORY: Past Surgical History:  Procedure Laterality Date  . BREAST REDUCTION SURGERY  Jan 1991  . ELBOW SURGERY Left   . KIDNEY SURGERY Right October 2013    Tumor removed   . LAPAROSCOPIC APPENDECTOMY N/A 06/08/2018   Procedure: APPENDECTOMY LAPAROSCOPIC;  Surgeon: Jovita Kussmaul, MD;  Location: [facility];  Service: General;  Laterality: N/A;  . TONSILLECTOMY    . VAGINAL HYSTERECTOMY  1979   partial    SOCIAL HISTORY: Social History   Tobacco Use  . Smoking status: Never Smoker  . Smokeless tobacco: Never Used  Substance Use Topics  . Alcohol use: Yes    Alcohol/week: 2.0 - 3.0 standard drinks    Types: 1 - 2 Glasses of wine, 1 Shots of liquor per week    Comment: couple of times a week  . Drug use: No    FAMILY HISTORY: Family History  Problem Relation Age of Onset  . Heart disease Father 89  . Lung cancer Father        smoker  . Hypertension Mother   . Anxiety disorder Mother   . Heart disease Brother   . Hypertension Other   . Colon cancer Cousin   .  Allergies Daughter   . Allergies Brother   . Melanoma Daughter   . Lung cancer Sister   . Liver disease Neg Hx   . Asthma Neg Hx   . Esophageal cancer Neg Hx   . Rectal cancer Neg Hx   . Stomach cancer Neg Hx    ROS: Review of Systems  Respiratory: Negative for shortness of breath.   Cardiovascular: Negative for chest pain.  Endo/Heme/Allergies:       Negative for polyphagia.   PHYSICAL EXAM: Pt in no acute distress  RECENT LABS AND TESTS: BMET    Component Value Date/Time   NA 141 09/03/2018 1542   K 5.1 09/03/2018 1542   CL 101 09/03/2018 1542   CO2 23 09/03/2018 1542   GLUCOSE 85 09/03/2018 1542   GLUCOSE 135 (H) 06/09/2018 0456   BUN 22 09/03/2018 1542   CREATININE 0.81 09/03/2018 1542   CALCIUM 11.1 (H) 09/03/2018 1542   GFRNONAA 74 09/03/2018 1542   GFRAA 85 09/03/2018 1542   Lab Results  Component Value Date   HGBA1C 5.1 09/03/2018   HGBA1C 5.2 04/01/2018   HGBA1C 5.4 08/25/2017   HGBA1C 5.4 12/16/2011   HGBA1C 5.7 05/03/2011   Lab Results  Component Value Date   INSULIN 5.7 09/03/2018   INSULIN 5.6 04/01/2018   INSULIN 50.3 (H) 08/25/2017   CBC    Component Value Date/Time   WBC 8.1 06/09/2018 0456   RBC 3.71 (L) 06/09/2018 0456   HGB 11.6 (L) 06/09/2018 0456   HGB 12.7 08/25/2017 1047   HCT 36.6 06/09/2018 0456   HCT 37.0 08/25/2017 1047   PLT 193 06/09/2018 0456   MCV 98.7 06/09/2018 0456   MCV 93 08/25/2017 1047   MCH 31.3 06/09/2018 0456   MCHC 31.7 06/09/2018 0456   RDW 13.3 06/09/2018 0456   RDW 13.2 08/25/2017 1047   LYMPHSABS 1.4 11/06/2017 1726   LYMPHSABS 1.2 08/25/2017 1047   MONOABS 0.6 11/06/2017 1726   EOSABS 0.1 11/06/2017 1726   EOSABS 0.1 08/25/2017 1047   BASOSABS 0.0 11/06/2017 1726   BASOSABS 0.0 08/25/2017 1047   Iron/TIBC/Ferritin/ %Sat No results found for: IRON, TIBC, FERRITIN, IRONPCTSAT Lipid Panel  Component Value Date/Time   CHOL 176 08/25/2017 1047   TRIG 91 08/25/2017 1047   HDL 84 08/25/2017  1047   CHOLHDL 3 08/24/2010 1134   VLDL 17.0 08/24/2010 1134   LDLCALC 74 08/25/2017 1047   Hepatic Function Panel     Component Value Date/Time   PROT 7.1 09/03/2018 1542   ALBUMIN 4.8 09/03/2018 1542   AST 17 09/03/2018 1542   ALT 16 09/03/2018 1542   ALKPHOS 55 09/03/2018 1542   BILITOT 0.4 09/03/2018 1542   BILIDIR 0.2 08/24/2010 1134      Component Value Date/Time   TSH 1.900 08/25/2017 1047   TSH 1.12 05/03/2011 1239   TSH 1.34 08/24/2010 1134   Results for Kassabian, Jamie D "[name]" (MRN [id number]) as of 06/07/2019 15:51  Ref. Range 09/03/2018 15:42  Vitamin D, 25-Hydroxy Latest Ref Range: 30.0 - 100.0 ng/mL 63.3   I, Michaelene Song, am acting as Location manager for [employer], DO  I have reviewed the above documentation for accuracy and completeness, and I agree with the above. -Jearld Lesch, DO

## 2019-06-21 ENCOUNTER — Other Ambulatory Visit: Payer: Self-pay

## 2019-06-21 ENCOUNTER — Telehealth (INDEPENDENT_AMBULATORY_CARE_PROVIDER_SITE_OTHER): Payer: Medicare Other | Admitting: Bariatrics

## 2019-06-21 ENCOUNTER — Encounter (INDEPENDENT_AMBULATORY_CARE_PROVIDER_SITE_OTHER): Payer: Self-pay | Admitting: Bariatrics

## 2019-06-21 DIAGNOSIS — Z9989 Dependence on other enabling machines and devices: Secondary | ICD-10-CM | POA: Diagnosis not present

## 2019-06-21 DIAGNOSIS — G4733 Obstructive sleep apnea (adult) (pediatric): Secondary | ICD-10-CM

## 2019-06-21 DIAGNOSIS — E8881 Metabolic syndrome: Secondary | ICD-10-CM

## 2019-06-21 DIAGNOSIS — Z6831 Body mass index (BMI) 31.0-31.9, adult: Secondary | ICD-10-CM | POA: Diagnosis not present

## 2019-06-21 DIAGNOSIS — E669 Obesity, unspecified: Secondary | ICD-10-CM | POA: Diagnosis not present

## 2019-06-21 NOTE — Progress Notes (Signed)
Office: [phone]  /  Fax: [phone] TeleHealth Visit:  Jamie [name] has verbally consented to this TeleHealth visit today. The patient is located at home, the provider is located at the [facility] office. The participants in this visit include the listed provider and patient. The visit was conducted today via FaceTime.  HPI:  Chief Complaint: OBESITY Jamie [name] is here to discuss her progress with her obesity treatment plan. She is on the Category 2 plan and states she is following her eating plan approximately 60% of the time. She states she is walking 20 minutes 4-5 times per week.  Jamie [name]'s weight remains the same (weight 170). She is not as active. She reports drinking adequate amounts of water and states she is doing fairly well with her protein. She is getting adequate fiber.  Today's visit was #35 Starting weight: 222 lbs Starting date: 08/25/2017   Obstructive Sleep Apnea, on CPAP Jamie [name] has a diagnosis of obstructive sleep apnea. She reports no problems with the CPAP and states she has restful sleep.  Insulin Resistance Jamie [name] has a diagnosis of insulin resistance and is on metformin (controlled with medication). Last A1c 5.1 on 09/03/2018 with an insulin of 5.7. No polyphagia.  ASSESSMENT AND PLAN:  OSA on CPAP  Insulin resistance  Class 1 obesity with serious comorbidity and body mass index (BMI) of 31.0 to 31.9 in adult, unspecified obesity type  PLAN:  Obstructive Sleep Apnea, on CPAP Jamie [name] was instructed to continue to use her CPAP.  Insulin Resistance Jamie [name] will continue to work on weight loss, exercise, keep carbohydrates low, and get adequate protein and healthy fats. She will follow-up as directed to monitor her progress.  Obesity Jamie [name] is currently in the action stage of change. As such, her goal is to continue with weight loss efforts. She has agreed to follow the Category 2 plan. We discussed with Jamie [name] that she is doing well with  weight maintenance. She will decrease "unhealthy" foods in the house. Jamie [name] has been instructed to do light weights and walk the dog (will walk, jog/walk) for weight loss and overall health benefits. We discussed the following Behavioral Modification Strategies today: increasing lean protein intake, decreasing simple carbohydrates, increasing vegetables, increase H20 intake, decrease eating out, no skipping meals, work on meal planning and easy cooking plans, and keeping healthy foods in the home.  Jamie [name] has agreed to follow-up with our clinic in 3 weeks. She was informed of the importance of frequent follow-up visits to maximize her success with intensive lifestyle modifications for her multiple health conditions.  ALLERGIES: Allergies  Allergen Reactions  . Buprenorphine Hcl Rash and Shortness Of Breath  . Morphine And Related Shortness Of Breath and Rash  . Morphine And Related Shortness Of Breath  . Aspirin Other (See Comments)    REACTION: ulcers  Can take 81 mg  . Avelox [Moxifloxacin Hcl In Nacl] Hives  . Diflucan [Fluconazole] Hives    Whelps and rash c skin peeling  . Diflucan [Fluconazole] Hives  . Latex Rash  . Moxifloxacin Palpitations    Tachycardia     MEDICATIONS: Current Outpatient Medications on File Prior to Visit  Medication Sig Dispense Refill  . Ascorbic Acid (VITAMIN C) 1000 MG tablet Take 1,000 mg by mouth daily.     Marland Kitchen aspirin EC 81 MG tablet Take 1 tablet (81 mg total) by mouth daily. 90 tablet 3  . BIOTIN PO Take 1 capsule by mouth daily.     . Cholecalciferol (VITAMIN  D3) 2000 units TABS Take 2,000 Units by mouth daily.     . clidinium-chlordiazePOXIDE (LIBRAX) 5-2.5 MG capsule Take 1 capsule by mouth 2 (two) times daily. 180 capsule 3  . clonazePAM (KLONOPIN) 1 MG tablet Take 0.5 mg by mouth daily as needed (restless leg syndrome).     . conjugated estrogens (PREMARIN) vaginal cream Place 1 Applicatorful vaginally every other day.     . diphenhydrAMINE  (BENADRYL) 25 MG tablet Take 25 mg by mouth daily as needed for allergies.     Marland Kitchen esomeprazole (NEXIUM) 40 MG capsule TAKE 1 CAPSULE TWICE A DAY BEFORE MEALS 180 capsule 3  . linaclotide (LINZESS) 145 MCG CAPS capsule Take 1 capsule (145 mcg total) by mouth daily. 30 capsule 0  . metaxalone (SKELAXIN) 800 MG tablet Take 400 mg by mouth daily as needed for muscle spasms.    . metFORMIN (GLUCOPHAGE) 500 MG tablet Take 1 tablet (500 mg total) by mouth daily with breakfast. 60 tablet 0  . Omega-3-6-9 CAPS Take 1 capsule by mouth every evening.    . Prenatal Vit-Fe Fumarate-FA (PRENATAL MULTIVITAMIN) TABS tablet Take 1 tablet by mouth every evening.     . Probiotic Product (PROBIOTIC-10 PO) Take 1 capsule by mouth 2 (two) times daily.     Marland Kitchen spironolactone (ALDACTONE) 50 MG tablet Take 50 mg by mouth daily.      No current facility-administered medications on file prior to visit.    PAST MEDICAL HISTORY: Past Medical History:  Diagnosis Date  . Allergic rhinitis   . Anxiety   . Arthritis    left foot  . Asthma   . Back pain   . Colon polyps    adenomatous  . Constipation   . Diverticulosis   . Dysrhythmia 1999   Dr Johnsie Cancel 2003  . Elevated LFTs   . Fatty liver disease, nonalcoholic   . GERD (gastroesophageal reflux disease)   . Headache(784.0)    migraines with vision changes but no pain  . Heart murmur   . Hypertension   . IBS (irritable bowel syndrome)   . Joint pain   . Migraines   . Muscle cramps   . Obesity   . Palpitations   . Pneumonia 2007  . Renal cell carcinoma ([place]) 2013   minimal partial nephrectomy  . Restless leg syndrome   . Sleep apnea    moderate; wears CPAP, uncertain about setting  . TIA (transient ischemic attack) 09/11/2014  . Ulcer   . Urinary incontinence   . Vitamin D deficiency     PAST SURGICAL HISTORY: Past Surgical History:  Procedure Laterality Date  . BREAST REDUCTION SURGERY  Jan 1991  . ELBOW SURGERY Left   . KIDNEY SURGERY Right October  2013    Tumor removed   . LAPAROSCOPIC APPENDECTOMY N/A 06/08/2018   Procedure: APPENDECTOMY LAPAROSCOPIC;  Surgeon: Jovita Kussmaul, MD;  Location: [facility];  Service: General;  Laterality: N/A;  . TONSILLECTOMY    . VAGINAL HYSTERECTOMY  1979   partial    SOCIAL HISTORY: Social History   Tobacco Use  . Smoking status: Never Smoker  . Smokeless tobacco: Never Used  Substance Use Topics  . Alcohol use: Yes    Alcohol/week: 2.0 - 3.0 standard drinks    Types: 1 - 2 Glasses of wine, 1 Shots of liquor per week    Comment: couple of times a week  . Drug use: No    FAMILY HISTORY: Family History  Problem Relation  Age of Onset  . Heart disease Father 27  . Lung cancer Father        smoker  . Hypertension Mother   . Anxiety disorder Mother   . Heart disease Brother   . Hypertension Other   . Colon cancer Cousin   . Allergies Daughter   . Allergies Brother   . Melanoma Daughter   . Lung cancer Sister   . Liver disease Neg Hx   . Asthma Neg Hx   . Esophageal cancer Neg Hx   . Rectal cancer Neg Hx   . Stomach cancer Neg Hx    ROS: Review of Systems  Respiratory:       Positive for OSA, on CPAP.  Endo/Heme/Allergies:       Negative for polyphagia.   PHYSICAL EXAM: There were no vitals taken for this visit. There is no height or weight on file to calculate BMI. Physical Exam: Pt in no acute distress.  RECENT LABS AND TESTS: BMET    Component Value Date/Time   NA 141 09/03/2018 1542   K 5.1 09/03/2018 1542   CL 101 09/03/2018 1542   CO2 23 09/03/2018 1542   GLUCOSE 85 09/03/2018 1542   GLUCOSE 135 (H) 06/09/2018 0456   BUN 22 09/03/2018 1542   CREATININE 0.81 09/03/2018 1542   CALCIUM 11.1 (H) 09/03/2018 1542   GFRNONAA 74 09/03/2018 1542   GFRAA 85 09/03/2018 1542   Lab Results  Component Value Date   HGBA1C 5.1 09/03/2018   HGBA1C 5.2 04/01/2018   HGBA1C 5.4 08/25/2017   HGBA1C 5.4 12/16/2011   HGBA1C 5.7 05/03/2011   Lab Results  Component Value Date    INSULIN 5.7 09/03/2018   INSULIN 5.6 04/01/2018   INSULIN 50.3 (H) 08/25/2017   CBC    Component Value Date/Time   WBC 8.1 06/09/2018 0456   RBC 3.71 (L) 06/09/2018 0456   HGB 11.6 (L) 06/09/2018 0456   HGB 12.7 08/25/2017 1047   HCT 36.6 06/09/2018 0456   HCT 37.0 08/25/2017 1047   PLT 193 06/09/2018 0456   MCV 98.7 06/09/2018 0456   MCV 93 08/25/2017 1047   MCH 31.3 06/09/2018 0456   MCHC 31.7 06/09/2018 0456   RDW 13.3 06/09/2018 0456   RDW 13.2 08/25/2017 1047   LYMPHSABS 1.4 11/06/2017 1726   LYMPHSABS 1.2 08/25/2017 1047   MONOABS 0.6 11/06/2017 1726   EOSABS 0.1 11/06/2017 1726   EOSABS 0.1 08/25/2017 1047   BASOSABS 0.0 11/06/2017 1726   BASOSABS 0.0 08/25/2017 1047   Iron/TIBC/Ferritin/ %Sat No results found for: IRON, TIBC, FERRITIN, IRONPCTSAT Lipid Panel     Component Value Date/Time   CHOL 176 08/25/2017 1047   TRIG 91 08/25/2017 1047   HDL 84 08/25/2017 1047   CHOLHDL 3 08/24/2010 1134   VLDL 17.0 08/24/2010 1134   LDLCALC 74 08/25/2017 1047   Hepatic Function Panel     Component Value Date/Time   PROT 7.1 09/03/2018 1542   ALBUMIN 4.8 09/03/2018 1542   AST 17 09/03/2018 1542   ALT 16 09/03/2018 1542   ALKPHOS 55 09/03/2018 1542   BILITOT 0.4 09/03/2018 1542   BILIDIR 0.2 08/24/2010 1134      Component Value Date/Time   TSH 1.900 08/25/2017 1047   TSH 1.12 05/03/2011 1239   TSH 1.34 08/24/2010 1134    OBESITY BEHAVIORAL INTERVENTION VISIT DOCUMENTATION FOR INSURANCE (~15 minutes)   I, Michaelene Song, am acting as Location manager for [employer], DO  I have reviewed  the above documentation for accuracy and completeness, and I agree with the above. -Jearld Lesch, DO

## 2019-07-12 ENCOUNTER — Encounter (INDEPENDENT_AMBULATORY_CARE_PROVIDER_SITE_OTHER): Payer: Self-pay | Admitting: Bariatrics

## 2019-07-12 ENCOUNTER — Telehealth (INDEPENDENT_AMBULATORY_CARE_PROVIDER_SITE_OTHER): Payer: Medicare Other | Admitting: Bariatrics

## 2019-07-12 ENCOUNTER — Other Ambulatory Visit: Payer: Self-pay

## 2019-07-12 DIAGNOSIS — G4733 Obstructive sleep apnea (adult) (pediatric): Secondary | ICD-10-CM | POA: Diagnosis not present

## 2019-07-12 DIAGNOSIS — Z9989 Dependence on other enabling machines and devices: Secondary | ICD-10-CM | POA: Diagnosis not present

## 2019-07-12 DIAGNOSIS — Z6833 Body mass index (BMI) 33.0-33.9, adult: Secondary | ICD-10-CM | POA: Diagnosis not present

## 2019-07-12 DIAGNOSIS — E669 Obesity, unspecified: Secondary | ICD-10-CM

## 2019-07-12 DIAGNOSIS — I10 Essential (primary) hypertension: Secondary | ICD-10-CM | POA: Diagnosis not present

## 2019-07-13 NOTE — Progress Notes (Signed)
TeleHealth Visit:  Due to the COVID-19 pandemic, this visit was completed with telemedicine (audio/video) technology to reduce patient and provider exposure as well as to preserve personal protective equipment.   Jamie [name] has verbally consented to this TeleHealth visit. The patient is located at home, the provider is located at the [facility] office. The participants in this visit include the listed provider and patient. The visit was conducted today via telephone call (20 minutes).  Chief Complaint: OBESITY Jamie [name] is here to discuss her progress with her obesity treatment plan. Jamie [name] is on the Category 2 Plan and states she is following her eating plan approximately 50% of the time. Jamie [name] states she is exercising 0 minutes 0 times per week.  Today's visit was #: 63 Starting weight: 222 lbs Starting date: 08/25/2017  Subjective:   Interim History: Jamie [name] is up 2-3 lbs (weight 172). She states she got to spend some time with her grandchildren. She reports doing well with her water intake and doing well with breakfast.  General review of systems is unchanged or negative.   Assessment/Plan:   1. Essential hypertension. She is taking spironolactone and is well controlled. She was advised to continue her medication.    2. OSA on CPAP. She reports restful sleep. She was instructed to wear CPAP nightly.   3. Class 1 obesity with serious comorbidity and body mass index (BMI) of 33.0 to 33.9 in adult, unspecified obesity type    Jamie [name] is currently in the action stage of change. As such, her goal is to continue with weight loss efforts. She has agreed to follow the Category 2 Plan. She will work on meal planning, intentional eating, increasing non-starchy vegetables, keeping carbs below 10 grams and protein more than 10 grams.  We discussed the following exercise goals today: Older adults should follow the adult guidelines. When older adults cannot meet the  adult guidelines, they should be as physically active as their abilities and conditions will allow.  Older adults should do exercises that maintain or improve balance if they are at risk of falling. .  We discussed the following behavioral modification strategies today: increasing lean protein intake, decreasing simple carbohydrates , increasing vegetables, increasing water intake, decreasing eating out, no skipping meals, meal planning and cooking strategies, keeping healthy foods in the home, better snacking choices and avoiding temptations.  Jamie [name] has agreed to follow-up with our clinic in 2-3 weeks. She was informed of the importance of frequent follow-up visits to maximize her success with intensive lifestyle modifications for her multiple health conditions.  Objective:    Lab Results  Component Value Date   CREATININE 0.81 09/03/2018   BUN 22 09/03/2018   NA 141 09/03/2018   K 5.1 09/03/2018   CL 101 09/03/2018   CO2 23 09/03/2018   Lab Results  Component Value Date   ALT 16 09/03/2018   AST 17 09/03/2018   ALKPHOS 55 09/03/2018   BILITOT 0.4 09/03/2018   Lab Results  Component Value Date   HGBA1C 5.1 09/03/2018   HGBA1C 5.2 04/01/2018   HGBA1C 5.4 08/25/2017   HGBA1C 5.4 12/16/2011   HGBA1C 5.7 05/03/2011   Lab Results  Component Value Date   INSULIN 5.7 09/03/2018   INSULIN 5.6 04/01/2018   INSULIN 50.3 (H) 08/25/2017   Lab Results  Component Value Date   TSH 1.900 08/25/2017   Lab Results  Component Value Date   CHOL 176 08/25/2017  HDL 84 08/25/2017   LDLCALC 74 08/25/2017   TRIG 91 08/25/2017   CHOLHDL 3 08/24/2010   Lab Results  Component Value Date   WBC 8.1 06/09/2018   HGB 11.6 (L) 06/09/2018   HCT 36.6 06/09/2018   MCV 98.7 06/09/2018   PLT 193 06/09/2018   No results found for: IRON, TIBC, FERRITIN Attestation Statements:   Reviewed by clinician on day of visit: allergies, medications, problem list, medical history, surgical  history, family history, social history and previous encounter notes.  Migdalia Dk, am acting as Location manager for [employer], DO  I have reviewed the above documentation for accuracy and completeness, and I agree with the above. Jearld Lesch, DO

## 2019-07-27 ENCOUNTER — Other Ambulatory Visit: Payer: Self-pay

## 2019-07-27 ENCOUNTER — Telehealth (INDEPENDENT_AMBULATORY_CARE_PROVIDER_SITE_OTHER): Payer: Medicare Other | Admitting: Bariatrics

## 2019-07-27 ENCOUNTER — Encounter (INDEPENDENT_AMBULATORY_CARE_PROVIDER_SITE_OTHER): Payer: Self-pay | Admitting: Bariatrics

## 2019-07-27 DIAGNOSIS — E8881 Metabolic syndrome: Secondary | ICD-10-CM

## 2019-07-27 DIAGNOSIS — E669 Obesity, unspecified: Secondary | ICD-10-CM

## 2019-07-27 DIAGNOSIS — Z6834 Body mass index (BMI) 34.0-34.9, adult: Secondary | ICD-10-CM | POA: Diagnosis not present

## 2019-07-27 DIAGNOSIS — I10 Essential (primary) hypertension: Secondary | ICD-10-CM

## 2019-07-27 MED ORDER — METFORMIN HCL 500 MG PO TABS: 500.0000 mg | ORAL_TABLET | Freq: Every day | ORAL | 0 refills | Status: DC

## 2019-07-28 NOTE — Progress Notes (Addendum)
TeleHealth Visit:  Due to the COVID-19 pandemic, this visit was completed with telemedicine (audio/video) technology to reduce patient and provider exposure as well as to preserve personal protective equipment.   Shanine has verbally consented to this TeleHealth visit. The patient is located at home, the provider is located at the [facility] office. The participants in this visit include the listed provider and patient. Callan was unable to use realtime audiovisual technology today and the telehealth visit was conducted via telephone ( 20 minutes ).   Chief Complaint: OBESITY Tacia is here to discuss her progress with her obesity treatment plan along with follow-up of her obesity related diagnoses. Meha is on the Category 2 Plan and states she is following her eating plan approximately 50% of the time. Emily states she is doing 0 minutes 0 times per week.  Today's visit was #: 80 Starting weight: 222 lbs Starting date: 08/25/17  Interim History: Laelle states that her weight remains the same. She is getting adequate protein most days.  Subjective:   1. Insulin resistance Darlin has a diagnosis of insulin resistance based on her elevated fasting insulin level >5. She continues to work on diet and exercise to decrease her risk of diabetes. She notes polyphagia.  2. Essential hypertension Marionette is taking spironolactone. Cardiovascular ROS: negative.  BP Readings from Last 3 Encounters:  02/09/19 121/79  01/28/19 107/75  09/08/18 95/61   Assessment/Plan:   1. Insulin resistance Bennetta will continue to work on weight loss, exercise, and decreasing simple carbohydrates to help decrease the risk of diabetes. We will refill metformin for 2 months. Zitlaly agreed to follow-up with [name] as directed to closely monitor her progress.  - metFORMIN (GLUCOPHAGE) 500 MG tablet; Take 1 tablet (500 mg total) by mouth daily with breakfast.  Dispense: 60 tablet; Refill: 0  2. Essential  hypertension Kaysea will continue her medications, and will work on healthy weight loss, diet, and exercise to improve blood pressure control. We will watch for signs of hypotension as she continues her lifestyle modifications.  3. Class 1 obesity with serious comorbidity and body mass index (BMI) of 34.0 to 34.9 in adult, unspecified obesity type Danelly is currently in the action stage of change. As such, her goal is to continue with weight loss efforts. She has agreed to the Category 2 Plan.   Exercise goals: Older adults should follow the adult guidelines. When older adults cannot meet the adult guidelines, they should be as physically active as their abilities and conditions will allow.  Older adults should do exercises that maintain or improve balance if they are at risk of falling.   Behavioral modification strategies: increasing lean protein intake, decreasing simple carbohydrates, increasing vegetables, increasing water intake, decreasing eating out, no skipping meals, meal planning and cooking strategies, keeping healthy foods in the home and planning for success.  Trishia has agreed to follow-up with our clinic in 2 weeks. She was informed of the importance of frequent follow-up visits to maximize her success with intensive lifestyle modifications for her multiple health conditions.  Objective:   VITALS: Per patient if applicable, see vitals. GENERAL: Alert and in no acute distress. CARDIOPULMONARY: No increased WOB. Speaking in clear sentences.  PSYCH: Pleasant and cooperative. Speech normal rate and rhythm. Affect is appropriate. Insight and judgement are appropriate. Attention is focused, linear, and appropriate.  NEURO: Oriented as arrived to appointment on time with no prompting.   Lab Results  Component Value Date   CREATININE  0.81 09/03/2018   BUN 22 09/03/2018   NA 141 09/03/2018   K 5.1 09/03/2018   CL 101 09/03/2018   CO2 23 09/03/2018   Lab Results  Component Value Date    ALT 16 09/03/2018   AST 17 09/03/2018   ALKPHOS 55 09/03/2018   BILITOT 0.4 09/03/2018   Lab Results  Component Value Date   HGBA1C 5.1 09/03/2018   HGBA1C 5.2 04/01/2018   HGBA1C 5.4 08/25/2017   HGBA1C 5.4 12/16/2011   HGBA1C 5.7 05/03/2011   Lab Results  Component Value Date   INSULIN 5.7 09/03/2018   INSULIN 5.6 04/01/2018   INSULIN 50.3 (H) 08/25/2017   Lab Results  Component Value Date   TSH 1.900 08/25/2017   Lab Results  Component Value Date   CHOL 176 08/25/2017   HDL 84 08/25/2017   LDLCALC 74 08/25/2017   TRIG 91 08/25/2017   CHOLHDL 3 08/24/2010   Lab Results  Component Value Date   WBC 8.1 06/09/2018   HGB 11.6 (L) 06/09/2018   HCT 36.6 06/09/2018   MCV 98.7 06/09/2018   PLT 193 06/09/2018   No results found for: IRON, TIBC, FERRITIN  Attestation Statements:   Reviewed by clinician on day of visit: allergies, medications, problem list, medical history, surgical history, family history, social history, and previous encounter notes.   Wilhemena Durie, am acting as Location manager for [employer], DO.  I have reviewed the above documentation for accuracy and completeness, and I agree with the above. Jearld Lesch, DO

## 2019-08-12 ENCOUNTER — Encounter (INDEPENDENT_AMBULATORY_CARE_PROVIDER_SITE_OTHER): Payer: Self-pay | Admitting: Bariatrics

## 2019-08-12 ENCOUNTER — Other Ambulatory Visit: Payer: Self-pay

## 2019-08-12 ENCOUNTER — Telehealth (INDEPENDENT_AMBULATORY_CARE_PROVIDER_SITE_OTHER): Payer: Medicare Other | Admitting: Bariatrics

## 2019-08-12 DIAGNOSIS — E669 Obesity, unspecified: Secondary | ICD-10-CM | POA: Diagnosis not present

## 2019-08-12 DIAGNOSIS — K760 Fatty (change of) liver, not elsewhere classified: Secondary | ICD-10-CM

## 2019-08-12 DIAGNOSIS — I10 Essential (primary) hypertension: Secondary | ICD-10-CM

## 2019-08-12 DIAGNOSIS — Z6834 Body mass index (BMI) 34.0-34.9, adult: Secondary | ICD-10-CM | POA: Diagnosis not present

## 2019-08-12 NOTE — Progress Notes (Signed)
TeleHealth Visit:  Due to the COVID-19 pandemic, this visit was completed with telemedicine (audio/video) technology to reduce patient and provider exposure as well as to preserve personal protective equipment.   Jamie [name] has verbally consented to this TeleHealth visit. The patient is located at home, the provider is located at the [facility] office. The participants in this visit include the listed provider and patient. The visit was conducted today via telephone call.  Chief Complaint: OBESITY Jamie [name] is here to discuss her progress with her obesity treatment plan along with follow-up of her obesity related diagnoses. Jamie [name] is on the Category 2 Plan and states she is following her eating plan approximately 50% of the time. Jamie [name] states she is exercising 0 minutes 0 times per week.  Today's visit was #: 55 Starting weight: 222 lbs Starting date: 08/25/2017  Interim History: Jamie [name] states that her weight remains the same. She reports drinking adequate water.  Subjective:   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). Last liver enzymes were normal.  Essential hypertension. Hypertension is well controlled.  BP Readings from Last 3 Encounters:  02/09/19 121/79  01/28/19 107/75  09/08/18 95/61   Lab Results  Component Value Date   CREATININE 0.81 09/03/2018   CREATININE 0.82 06/09/2018   CREATININE 0.69 06/08/2018   Assessment/Plan:   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). Jamie [name] will increase exercise slowly to 150 minutes weekly and will continue weight loss.  Essential hypertension. Jamie [name] is working on healthy weight loss and exercise to improve blood pressure control. We will watch for signs of hypotension as she continues her lifestyle modifications. She will decrease sodium, continue weight loss, and will remain active.  Class 1 obesity with serious comorbidity and body mass  index (BMI) of 34.0 to 34.9 in adult, unspecified obesity type.  Jamie [name] is currently in the action stage of change. As such, her goal is to continue with weight loss efforts. She has agreed to the Category 2 Plan.   She will work on meal planning, intentional eating, and will pay attention to her fullness cues.  Exercise goals: Older adults should follow the adult guidelines. When older adults cannot meet the adult guidelines, they should be as physically active as their abilities and conditions will allow.   Behavioral modification strategies: increasing lean protein intake, decreasing simple carbohydrates, increasing vegetables, increasing water intake, decreasing eating out, no skipping meals, meal planning and cooking strategies, keeping healthy foods in the home and planning for success.  Jamie [name] has agreed to follow-up with our clinic in 2 weeks. She was informed of the importance of frequent follow-up visits to maximize her success with intensive lifestyle modifications for her multiple health conditions.  Objective:   VITALS: Per patient if applicable, see vitals. GENERAL: Alert and in no acute distress. CARDIOPULMONARY: No increased WOB. Speaking in clear sentences.  PSYCH: Pleasant and cooperative. Speech normal rate and rhythm. Affect is appropriate. Insight and judgement are appropriate. Attention is focused, linear, and appropriate.  NEURO: Oriented as arrived to appointment on time with no prompting.   Lab Results  Component Value Date   CREATININE 0.81 09/03/2018   BUN 22 09/03/2018   NA 141 09/03/2018   K 5.1 09/03/2018   CL 101 09/03/2018   CO2 23 09/03/2018   Lab Results  Component Value Date   ALT 16 09/03/2018   AST 17 09/03/2018   ALKPHOS 55 09/03/2018   BILITOT 0.4 09/03/2018   Lab Results  Component Value Date   HGBA1C 5.1 09/03/2018   HGBA1C 5.2 04/01/2018   HGBA1C 5.4 08/25/2017   HGBA1C 5.4 12/16/2011   HGBA1C 5.7 05/03/2011   Lab Results  Component  Value Date   INSULIN 5.7 09/03/2018   INSULIN 5.6 04/01/2018   INSULIN 50.3 (H) 08/25/2017   Lab Results  Component Value Date   TSH 1.900 08/25/2017   Lab Results  Component Value Date   CHOL 176 08/25/2017   HDL 84 08/25/2017   LDLCALC 74 08/25/2017   TRIG 91 08/25/2017   CHOLHDL 3 08/24/2010   Lab Results  Component Value Date   WBC 8.1 06/09/2018   HGB 11.6 (L) 06/09/2018   HCT 36.6 06/09/2018   MCV 98.7 06/09/2018   PLT 193 06/09/2018   No results found for: IRON, TIBC, FERRITIN  Attestation Statements:   Reviewed by clinician on day of visit: allergies, medications, problem list, medical history, surgical history, family history, social history, and previous encounter notes.  Time spent on visit including pre-visit chart review and post-visit care was 20 minutes.   Migdalia Dk, am acting as Location manager for [employer], DO   I have reviewed the above documentation for accuracy and completeness, and I agree with the above. Jearld Lesch, DO

## 2019-08-16 ENCOUNTER — Ambulatory Visit: Payer: Medicare Other | Attending: Internal Medicine

## 2019-08-16 DIAGNOSIS — Z23 Encounter for immunization: Secondary | ICD-10-CM | POA: Insufficient documentation

## 2019-08-16 NOTE — Progress Notes (Signed)
   Covid-19 Vaccination Clinic  Name:  Jamie [name]    MRN: [id number] DOB: [birth year]  08/16/2019  [name] was observed post Covid-19 immunization for 15 minutes without incidence. She was provided with Vaccine Information Sheet and instruction to access the V-Safe system.   [name] was instructed to call 911 with any severe reactions post vaccine: Marland Kitchen Difficulty breathing  . Swelling of your face and throat  . A fast heartbeat  . A bad rash all over your body  . Dizziness and weakness    Immunizations Administered    Name Date Dose VIS Date Route   Pfizer COVID-19 Vaccine 08/16/2019  9:52 AM 0.3 mL 06/18/2019 Intramuscular   Manufacturer: [place]   Lot: [id number]   [place]: [id number]

## 2019-08-25 ENCOUNTER — Encounter (INDEPENDENT_AMBULATORY_CARE_PROVIDER_SITE_OTHER): Payer: Self-pay

## 2019-08-26 ENCOUNTER — Telehealth (INDEPENDENT_AMBULATORY_CARE_PROVIDER_SITE_OTHER): Payer: Medicare Other | Admitting: Bariatrics

## 2019-09-02 ENCOUNTER — Other Ambulatory Visit: Payer: Self-pay

## 2019-09-02 ENCOUNTER — Encounter (INDEPENDENT_AMBULATORY_CARE_PROVIDER_SITE_OTHER): Payer: Self-pay | Admitting: Bariatrics

## 2019-09-02 ENCOUNTER — Telehealth (INDEPENDENT_AMBULATORY_CARE_PROVIDER_SITE_OTHER): Payer: Medicare Other | Admitting: Bariatrics

## 2019-09-02 DIAGNOSIS — Z6832 Body mass index (BMI) 32.0-32.9, adult: Secondary | ICD-10-CM

## 2019-09-02 DIAGNOSIS — E669 Obesity, unspecified: Secondary | ICD-10-CM

## 2019-09-02 DIAGNOSIS — G4733 Obstructive sleep apnea (adult) (pediatric): Secondary | ICD-10-CM | POA: Diagnosis not present

## 2019-09-02 DIAGNOSIS — I10 Essential (primary) hypertension: Secondary | ICD-10-CM | POA: Diagnosis not present

## 2019-09-02 NOTE — Progress Notes (Signed)
TeleHealth Visit:  Due to the COVID-19 pandemic, this visit was completed with telemedicine (audio/video) technology to reduce patient and provider exposure as well as to preserve personal protective equipment.   Kathrynne D Dabbs has verbally consented to this TeleHealth visit. The patient is located at home, the provider is located at the [facility] office. The participants in this visit include the listed provider and patient. The visit was conducted today via telephone call.  Nyara was unable to use realtime audiovisual technology today and the telehealth visit was conducted via telephone.  Chief Complaint: OBESITY Diamon is here to discuss her progress with her obesity treatment plan along with follow-up of her obesity related diagnoses. Nick is on the Category 2 Plan and states she is following her eating plan approximately 40% of the time. Brendi states she is walking 15 minutes 2 times per week.  Today's visit was #: 75 Starting weight: 222 lbs Starting date: 08/25/2017  Interim History: Trenise states that she has gained 3 lbs (weight 176). She states that she had such a good routine, but has been disrupted.  Subjective:   OSA (obstructive sleep apnea). Marrianne is wearing CPAP and reports restful sleep.  Essential hypertension. Blood pressure is well controlled. She reports no problems with her medication.  BP Readings from Last 3 Encounters:  02/09/19 121/79  01/28/19 107/75  09/08/18 95/61   Lab Results  Component Value Date   CREATININE 0.81 09/03/2018   CREATININE 0.82 06/09/2018   CREATININE 0.69 06/08/2018   Assessment/Plan:   OSA (obstructive sleep apnea). Intensive lifestyle modifications are the first line treatment for this issue. We discussed several lifestyle modifications today and she will continue to work on diet, exercise and weight loss efforts. We will continue to monitor. Orders and follow up as documented in patient record. Almira will  wear CPAP nightly.  Counseling  Sleep apnea is a condition in which breathing pauses or becomes shallow during sleep. This happens over and over during the night. This disrupts your sleep and keeps your body from getting the rest that it needs, which can cause tiredness and lack of energy (fatigue) during the day.  Sleep apnea treatment: If you were given a device to open your airway while you sleep, USE IT!  Sleep hygiene:   Limit or avoid alcohol, caffeinated beverages, and cigarettes, especially close to bedtime.   Do not eat a large meal or eat spicy foods right before bedtime. This can lead to digestive discomfort that can make it hard for you to sleep.  Keep a sleep diary to help you and your health care provider figure out what could be causing your insomnia.  . Make your bedroom a dark, comfortable place where it is easy to fall asleep. ? Put up shades or blackout curtains to block light from outside. ? Use a white noise machine to block noise. ? Keep the temperature cool. . Limit screen use before bedtime. This includes: ? Watching TV. ? Using your smartphone, tablet, or computer. . Stick to a routine that includes going to bed and waking up at the same times every day and night. This can help you fall asleep faster. Consider making a quiet activity, such as reading, part of your nighttime routine. . Try to avoid taking naps during the day so that you sleep better at night. . Get out of bed if you are still awake after 15 minutes of trying to sleep. Keep the lights down, but try  reading or doing a quiet activity. When you feel sleepy, go back to bed.  Essential hypertension. Marella is working on healthy weight loss and exercise to improve blood pressure control. We will watch for signs of hypotension as she continues her lifestyle modifications. She will continue her medications as directed.  Class 1 obesity with serious comorbidity and body mass index (BMI) of 32.0 to 32.9 in  adult, unspecified obesity type.  Railey is currently in the action stage of change. As such, her goal is to continue with weight loss efforts. She has agreed to the Category 2 Plan.   She will continue to weigh herself at home until she returns to the office and will increase her protein intake.  Exercise goals: She will continue walking with the dog.  Behavioral modification strategies: increasing lean protein intake, decreasing simple carbohydrates, increasing vegetables, increasing water intake, decreasing eating out, no skipping meals, meal planning and cooking strategies, keeping healthy foods in the home, ways to avoid night time snacking, better snacking choices and planning for success.  Charlii has agreed to follow-up with our clinic in 2 weeks. She was informed of the importance of frequent follow-up visits to maximize her success with intensive lifestyle modifications for her multiple health conditions.  Objective:   VITALS: Per patient if applicable, see vitals. GENERAL: Alert and in no acute distress. CARDIOPULMONARY: No increased WOB. Speaking in clear sentences.  PSYCH: Pleasant and cooperative. Speech normal rate and rhythm. Affect is appropriate. Insight and judgement are appropriate. Attention is focused, linear, and appropriate.  NEURO: Oriented as arrived to appointment on time with no prompting.   Lab Results  Component Value Date   CREATININE 0.81 09/03/2018   BUN 22 09/03/2018   NA 141 09/03/2018   K 5.1 09/03/2018   CL 101 09/03/2018   CO2 23 09/03/2018   Lab Results  Component Value Date   ALT 16 09/03/2018   AST 17 09/03/2018   ALKPHOS 55 09/03/2018   BILITOT 0.4 09/03/2018   Lab Results  Component Value Date   HGBA1C 5.1 09/03/2018   HGBA1C 5.2 04/01/2018   HGBA1C 5.4 08/25/2017   HGBA1C 5.4 12/16/2011   HGBA1C 5.7 05/03/2011   Lab Results  Component Value Date   INSULIN 5.7 09/03/2018   INSULIN 5.6 04/01/2018   INSULIN 50.3 (H) 08/25/2017    Lab Results  Component Value Date   TSH 1.900 08/25/2017   Lab Results  Component Value Date   CHOL 176 08/25/2017   HDL 84 08/25/2017   LDLCALC 74 08/25/2017   TRIG 91 08/25/2017   CHOLHDL 3 08/24/2010   Lab Results  Component Value Date   WBC 8.1 06/09/2018   HGB 11.6 (L) 06/09/2018   HCT 36.6 06/09/2018   MCV 98.7 06/09/2018   PLT 193 06/09/2018   No results found for: IRON, TIBC, FERRITIN  Attestation Statements:   Reviewed by clinician on day of visit: allergies, medications, problem list, medical history, surgical history, family history, social history, and previous encounter notes.  Time spent on visit including pre-visit chart review and post-visit charting and care was 20 minutes.   Migdalia Dk, am acting as Location manager for [employer], DO   I have reviewed the above documentation for accuracy and completeness, and I agree with the above. Jearld Lesch, DO

## 2019-09-03 ENCOUNTER — Ambulatory Visit: Payer: Medicare Other

## 2019-09-08 ENCOUNTER — Ambulatory Visit: Payer: Medicare Other | Attending: Internal Medicine

## 2019-09-08 DIAGNOSIS — Z23 Encounter for immunization: Secondary | ICD-10-CM | POA: Insufficient documentation

## 2019-09-08 NOTE — Progress Notes (Signed)
   Covid-19 Vaccination Clinic  Name:  [name] ELLINGER    MRN: [id number] DOB: 1945/11/19  09/08/2019  Ms. Spano was observed post Covid-19 immunization for 15 minutes without incident. She was provided with Vaccine Information Sheet and instruction to access the V-Safe system.   Ms. Torrance was instructed to call 911 with any severe reactions post vaccine: Marland Kitchen Difficulty breathing  . Swelling of face and throat  . A fast heartbeat  . A bad rash all over body  . Dizziness and weakness   Immunizations Administered    Name Date Dose VIS Date Route   Pfizer COVID-19 Vaccine 09/08/2019 11:22 AM 0.3 mL 06/18/2019 Intramuscular   Manufacturer: [place]   Lot: [id number]   [place]: [id number]

## 2019-09-16 ENCOUNTER — Telehealth (INDEPENDENT_AMBULATORY_CARE_PROVIDER_SITE_OTHER): Payer: Medicare Other | Admitting: Bariatrics

## 2019-09-16 ENCOUNTER — Encounter (INDEPENDENT_AMBULATORY_CARE_PROVIDER_SITE_OTHER): Payer: Self-pay | Admitting: Bariatrics

## 2019-09-16 ENCOUNTER — Other Ambulatory Visit: Payer: Self-pay

## 2019-09-16 DIAGNOSIS — G4733 Obstructive sleep apnea (adult) (pediatric): Secondary | ICD-10-CM | POA: Diagnosis not present

## 2019-09-16 DIAGNOSIS — Z6834 Body mass index (BMI) 34.0-34.9, adult: Secondary | ICD-10-CM | POA: Diagnosis not present

## 2019-09-16 DIAGNOSIS — I10 Essential (primary) hypertension: Secondary | ICD-10-CM | POA: Diagnosis not present

## 2019-09-16 DIAGNOSIS — E669 Obesity, unspecified: Secondary | ICD-10-CM | POA: Diagnosis not present

## 2019-09-16 NOTE — Progress Notes (Signed)
TeleHealth Visit:  Due to the COVID-19 pandemic, this visit was completed with telemedicine (audio/video) technology to reduce patient and provider exposure as well as to preserve personal protective equipment.   Alaijah D Kumari has verbally consented to this TeleHealth visit. The patient is located at home, the provider is located at the [facility] office. The participants in this visit include the listed provider and patient. The visit was conducted today via audio.  Lavonia was unable to use realtime audiovisual technology today and the telehealth visit was conducted via telephone.  Chief Complaint: OBESITY June is here to discuss her progress with her obesity treatment plan along with follow-up of her obesity related diagnoses. Cearra is on the Category 2 Plan and states she is following her eating plan approximately 50% of the time. Kristain states she is walking 15 minutes 7 times per week.  Today's visit was #: 40 Starting weight: 222 lbs Starting date: 08/25/2017  Interim History: Margretta is up 1 lb (weight 177). She has gotten her second COVID shot. She does not have a problem with water.  Subjective:   Essential hypertension. Robbie is taking spironolactone.  BP Readings from Last 3 Encounters:  02/09/19 121/79  01/28/19 107/75  09/08/18 95/61   Lab Results  Component Value Date   CREATININE 0.81 09/03/2018   CREATININE 0.82 06/09/2018   CREATININE 0.69 06/08/2018   OSA (obstructive sleep apnea). Vannesa is wearing her CPAP.  Assessment/Plan:   Essential hypertension. Kairi is working on healthy weight loss and exercise to improve blood pressure control. We will watch for signs of hypotension as she continues her lifestyle modifications. She will continue the spironolactone as directed.  OSA (obstructive sleep apnea). Intensive lifestyle modifications are the first line treatment for this issue. We discussed several lifestyle modifications today and she  will continue to work on diet, exercise and weight loss efforts. We will continue to monitor. Orders and follow up as documented in patient record. Mahogani will continue to use her CPAP.  Counseling  Sleep apnea is a condition in which breathing pauses or becomes shallow during sleep. This happens over and over during the night. This disrupts your sleep and keeps your body from getting the rest that it needs, which can cause tiredness and lack of energy (fatigue) during the day.  Sleep apnea treatment: If you were given a device to open your airway while you sleep, USE IT!  Sleep hygiene:   Limit or avoid alcohol, caffeinated beverages, and cigarettes, especially close to bedtime.   Do not eat a large meal or eat spicy foods right before bedtime. This can lead to digestive discomfort that can make it hard for you to sleep.  Keep a sleep diary to help you and your health care provider figure out what could be causing your insomnia.  . Make your bedroom a dark, comfortable place where it is easy to fall asleep. ? Put up shades or blackout curtains to block light from outside. ? Use a white noise machine to block noise. ? Keep the temperature cool. . Limit screen use before bedtime. This includes: ? Watching TV. ? Using your smartphone, tablet, or computer. . Stick to a routine that includes going to bed and waking up at the same times every day and night. This can help you fall asleep faster. Consider making a quiet activity, such as reading, part of your nighttime routine. . Try to avoid taking naps during the day so that you sleep  better at night. . Get out of bed if you are still awake after 15 minutes of trying to sleep. Keep the lights down, but try reading or doing a quiet activity. When you feel sleepy, go back to bed.  Class 1 obesity with serious comorbidity and body mass index (BMI) of 34.0 to 34.9 in adult, unspecified obesity type.  Cookie is currently in the action stage of  change. As such, her goal is to continue with weight loss efforts. She has agreed to the Category 2 Plan.   She will work on meal planning, mindful eating, and keeping her water and protein levels high.  Exercise goals: Zeriah will walk her dog and she wants to get back to the gym.   Behavioral modification strategies: increasing lean protein intake, decreasing simple carbohydrates, increasing vegetables, increasing water intake, decreasing eating out, no skipping meals, meal planning and cooking strategies, keeping healthy foods in the home, ways to avoid boredom eating, ways to avoid night time snacking, better snacking choices, emotional eating strategies and planning for success.  Artavia has agreed to follow-up with our clinic in 2 weeks. She was informed of the importance of frequent follow-up visits to maximize her success with intensive lifestyle modifications for her multiple health conditions.  Objective:   VITALS: Per patient if applicable, see vitals. GENERAL: Alert and in no acute distress. CARDIOPULMONARY: No increased WOB. Speaking in clear sentences.  PSYCH: Pleasant and cooperative. Speech normal rate and rhythm. Affect is appropriate. Insight and judgement are appropriate. Attention is focused, linear, and appropriate.  NEURO: Oriented as arrived to appointment on time with no prompting.   Lab Results  Component Value Date   CREATININE 0.81 09/03/2018   BUN 22 09/03/2018   NA 141 09/03/2018   K 5.1 09/03/2018   CL 101 09/03/2018   CO2 23 09/03/2018   Lab Results  Component Value Date   ALT 16 09/03/2018   AST 17 09/03/2018   ALKPHOS 55 09/03/2018   BILITOT 0.4 09/03/2018   Lab Results  Component Value Date   HGBA1C 5.1 09/03/2018   HGBA1C 5.2 04/01/2018   HGBA1C 5.4 08/25/2017   HGBA1C 5.4 12/16/2011   HGBA1C 5.7 05/03/2011   Lab Results  Component Value Date   INSULIN 5.7 09/03/2018   INSULIN 5.6 04/01/2018   INSULIN 50.3 (H) 08/25/2017   Lab Results    Component Value Date   TSH 1.900 08/25/2017   Lab Results  Component Value Date   CHOL 176 08/25/2017   HDL 84 08/25/2017   LDLCALC 74 08/25/2017   TRIG 91 08/25/2017   CHOLHDL 3 08/24/2010   Lab Results  Component Value Date   WBC 8.1 06/09/2018   HGB 11.6 (L) 06/09/2018   HCT 36.6 06/09/2018   MCV 98.7 06/09/2018   PLT 193 06/09/2018   No results found for: IRON, TIBC, FERRITIN  Attestation Statements:   Reviewed by clinician on day of visit: allergies, medications, problem list, medical history, surgical history, family history, social history, and previous encounter notes.  Time spent on visit including pre-visit chart review and post-visit charting and care was 20 minutes.   Migdalia Dk, am acting as Location manager for [employer], DO   I have reviewed the above documentation for accuracy and completeness, and I agree with the above. Jearld Lesch, DO

## 2019-09-30 ENCOUNTER — Other Ambulatory Visit: Payer: Self-pay

## 2019-09-30 ENCOUNTER — Encounter (INDEPENDENT_AMBULATORY_CARE_PROVIDER_SITE_OTHER): Payer: Self-pay | Admitting: Bariatrics

## 2019-09-30 ENCOUNTER — Telehealth (INDEPENDENT_AMBULATORY_CARE_PROVIDER_SITE_OTHER): Payer: Medicare Other | Admitting: Bariatrics

## 2019-09-30 DIAGNOSIS — Z6832 Body mass index (BMI) 32.0-32.9, adult: Secondary | ICD-10-CM

## 2019-09-30 DIAGNOSIS — E8881 Metabolic syndrome: Secondary | ICD-10-CM | POA: Diagnosis not present

## 2019-09-30 DIAGNOSIS — I10 Essential (primary) hypertension: Secondary | ICD-10-CM

## 2019-09-30 DIAGNOSIS — E669 Obesity, unspecified: Secondary | ICD-10-CM | POA: Diagnosis not present

## 2019-09-30 MED ORDER — METFORMIN HCL 500 MG PO TABS: 500.0000 mg | ORAL_TABLET | Freq: Every day | ORAL | 0 refills | Status: DC

## 2019-09-30 NOTE — Progress Notes (Signed)
TeleHealth Visit:  Due to the COVID-19 pandemic, this visit was completed with telemedicine (audio/video) technology to reduce patient and provider exposure as well as to preserve personal protective equipment.   Jamie [name] has verbally consented to this TeleHealth visit. The patient is located at home, the provider is located at the [facility] office. The participants in this visit include the listed provider and patient. The visit was conducted today via audio.  Bina was unable to use realtime audiovisual technology today and the telehealth visit was conducted via telephone.  Chief Complaint: OBESITY Jamie [name] is here to discuss her progress with her obesity treatment plan along with follow-up of her obesity related diagnoses. Jamie [name] is on the Category 2 Plan and states she is following her eating plan approximately 50% of the time. Jamie [name] states she is walking 30 minutes 7 times per week.  Today's visit was #: 46 Starting weight: 222 lbs Starting date: 08/25/2017  Interim History: Jamie [name] states that she is up 1 lb (weight 178). She reports being more fatigued.  Subjective:   Insulin resistance. Jamie [name] has a diagnosis of insulin resistance based on her elevated fasting insulin level >5. She continues to work on diet and exercise to decrease her risk of diabetes. No polyphagia.  Lab Results  Component Value Date   INSULIN 5.7 09/03/2018   INSULIN 5.6 04/01/2018   INSULIN 50.3 (H) 08/25/2017   Lab Results  Component Value Date   HGBA1C 5.1 09/03/2018   Essential hypertension. Jamie [name] is taking spironolactone. Blood pressure is controlled.  BP Readings from Last 3 Encounters:  02/09/19 121/79  01/28/19 107/75  09/08/18 95/61   Lab Results  Component Value Date   CREATININE 0.81 09/03/2018   CREATININE 0.82 06/09/2018   CREATININE 0.69 06/08/2018   Assessment/Plan:   Insulin resistance. Jamie [name] will continue to work on weight loss, exercise, and decreasing  simple carbohydrates to help decrease the risk of diabetes. Jamie [name] agreed to follow-up with [name] as directed to closely monitor her progress. She was given a prescription for metFORMIN (GLUCOPHAGE) 500 MG tablet 1 PO daily with breakfast #60 with 0 refills.  Essential hypertension. Jamie [name] is working on healthy weight loss and exercise to improve blood pressure control. We will watch for signs of hypotension as she continues her lifestyle modifications. She will continue her medication as directed.  Class 1 obesity with serious comorbidity and body mass index (BMI) of 32.0 to 32.9 in adult, unspecified obesity type.  Jamie [name] is currently in the action stage of change. As such, her goal is to continue with weight loss efforts. She has agreed to the Category 2 Plan.   She will increase her water and protein intake and will get labs at her next visit.  Exercise goals: Jamie [name] will continue walking 30 minutes 7 days a week.  Behavioral modification strategies: increasing lean protein intake, decreasing simple carbohydrates, increasing vegetables, increasing water intake, decreasing eating out, no skipping meals, meal planning and cooking strategies, keeping healthy foods in the home and planning for success.  Jamie [name] has agreed to follow-up with our clinic in 2 weeks. She was informed of the importance of frequent follow-up visits to maximize her success with intensive lifestyle modifications for her multiple health conditions.  Objective:   VITALS: Per patient if applicable, see vitals. GENERAL: Alert and in no acute distress. CARDIOPULMONARY: No increased WOB. Speaking in clear sentences.  PSYCH: Pleasant and cooperative. Speech normal rate and rhythm. Affect is appropriate. Insight and judgement  are appropriate. Attention is focused, linear, and appropriate.  NEURO: Oriented as arrived to appointment on time with no prompting.   Lab Results  Component Value Date   CREATININE 0.81 09/03/2018   BUN 22  09/03/2018   NA 141 09/03/2018   K 5.1 09/03/2018   CL 101 09/03/2018   CO2 23 09/03/2018   Lab Results  Component Value Date   ALT 16 09/03/2018   AST 17 09/03/2018   ALKPHOS 55 09/03/2018   BILITOT 0.4 09/03/2018   Lab Results  Component Value Date   HGBA1C 5.1 09/03/2018   HGBA1C 5.2 04/01/2018   HGBA1C 5.4 08/25/2017   HGBA1C 5.4 12/16/2011   HGBA1C 5.7 05/03/2011   Lab Results  Component Value Date   INSULIN 5.7 09/03/2018   INSULIN 5.6 04/01/2018   INSULIN 50.3 (H) 08/25/2017   Lab Results  Component Value Date   TSH 1.900 08/25/2017   Lab Results  Component Value Date   CHOL 176 08/25/2017   HDL 84 08/25/2017   LDLCALC 74 08/25/2017   TRIG 91 08/25/2017   CHOLHDL 3 08/24/2010   Lab Results  Component Value Date   WBC 8.1 06/09/2018   HGB 11.6 (L) 06/09/2018   HCT 36.6 06/09/2018   MCV 98.7 06/09/2018   PLT 193 06/09/2018   No results found for: IRON, TIBC, FERRITIN  Attestation Statements:   Reviewed by clinician on day of visit: allergies, medications, problem list, medical history, surgical history, family history, social history, and previous encounter notes.  Time spent on visit including pre-visit chart review and post-visit charting and care was 20 minutes.   Migdalia Dk, am acting as Location manager for [employer], DO   I have reviewed the above documentation for accuracy and completeness, and I agree with the above. Jearld Lesch, DO

## 2019-10-07 ENCOUNTER — Ambulatory Visit: Payer: Medicare Other | Admitting: Internal Medicine

## 2019-10-18 ENCOUNTER — Ambulatory Visit (INDEPENDENT_AMBULATORY_CARE_PROVIDER_SITE_OTHER): Payer: Medicare Other | Admitting: Family Medicine

## 2019-10-18 ENCOUNTER — Telehealth: Payer: Self-pay

## 2019-10-18 ENCOUNTER — Other Ambulatory Visit: Payer: Self-pay

## 2019-10-18 ENCOUNTER — Telehealth: Payer: Self-pay | Admitting: Internal Medicine

## 2019-10-18 VITALS — BP 105/63 | HR 73 | Temp 98.2°F | Ht 63.0 in | Wt 184.0 lb

## 2019-10-18 DIAGNOSIS — D508 Other iron deficiency anemias: Secondary | ICD-10-CM | POA: Diagnosis not present

## 2019-10-18 DIAGNOSIS — Z6832 Body mass index (BMI) 32.0-32.9, adult: Secondary | ICD-10-CM

## 2019-10-18 DIAGNOSIS — E669 Obesity, unspecified: Secondary | ICD-10-CM | POA: Diagnosis not present

## 2019-10-18 DIAGNOSIS — E8881 Metabolic syndrome: Secondary | ICD-10-CM

## 2019-10-18 DIAGNOSIS — R739 Hyperglycemia, unspecified: Secondary | ICD-10-CM

## 2019-10-18 MED ORDER — LINACLOTIDE 145 MCG PO CAPS: 145.0000 ug | ORAL_CAPSULE | Freq: Every day | ORAL | 0 refills | Status: DC

## 2019-10-18 NOTE — Telephone Encounter (Signed)
Linzess sent to [facility]

## 2019-10-18 NOTE — Telephone Encounter (Signed)
Patient states the Linzess should go to [facility]

## 2019-10-18 NOTE — Telephone Encounter (Signed)
Prior authorization for Linzess approved through cover my meds.  Approval through 10/17/2020

## 2019-10-19 LAB — COMPREHENSIVE METABOLIC PANEL
ALT: 25 IU/L (ref 0–32)
AST: 23 IU/L (ref 0–40)
Albumin/Globulin Ratio: 2 (ref 1.2–2.2)
Albumin: 5 g/dL — ABNORMAL HIGH (ref 3.7–4.7)
Alkaline Phosphatase: 64 IU/L (ref 39–117)
BUN/Creatinine Ratio: 20 (ref 12–28)
BUN: 19 mg/dL (ref 8–27)
Bilirubin Total: 0.3 mg/dL (ref 0.0–1.2)
CO2: 18 mmol/L — ABNORMAL LOW (ref 20–29)
Calcium: 10.9 mg/dL — ABNORMAL HIGH (ref 8.7–10.3)
Chloride: 105 mmol/L (ref 96–106)
Creatinine, Ser: 0.95 mg/dL (ref 0.57–1.00)
GFR calc Af Amer: 70 mL/min/{1.73_m2} (ref >59)
GFR calc non Af Amer: 60 mL/min/{1.73_m2} (ref >59)
Globulin, Total: 2.5 g/dL (ref 1.5–4.5)
Glucose: 83 mg/dL (ref 65–99)
Potassium: 5.3 mmol/L — ABNORMAL HIGH (ref 3.5–5.2)
Sodium: 142 mmol/L (ref 134–144)
Total Protein: 7.5 g/dL (ref 6.0–8.5)

## 2019-10-19 LAB — CBC WITH DIFFERENTIAL/PLATELET
Basophils Absolute: 0 10*3/uL (ref 0.0–0.2)
Basos: 1 %
EOS (ABSOLUTE): 0.1 10*3/uL (ref 0.0–0.4)
Eos: 2 %
Hematocrit: 42 % (ref 34.0–46.6)
Hemoglobin: 14.4 g/dL (ref 11.1–15.9)
Immature Grans (Abs): 0 10*3/uL (ref 0.0–0.1)
Immature Granulocytes: 0 %
Lymphocytes Absolute: 1.2 10*3/uL (ref 0.7–3.1)
Lymphs: 19 %
MCH: 31.9 pg (ref 26.6–33.0)
MCHC: 34.3 g/dL (ref 31.5–35.7)
MCV: 93 fL (ref 79–97)
Monocytes Absolute: 0.4 10*3/uL (ref 0.1–0.9)
Monocytes: 7 %
Neutrophils Absolute: 4.4 10*3/uL (ref 1.4–7.0)
Neutrophils: 71 %
Platelets: 226 10*3/uL (ref 150–450)
RBC: 4.52 x10E6/uL (ref 3.77–5.28)
RDW: 12.2 % (ref 11.7–15.4)
WBC: 6.1 10*3/uL (ref 3.4–10.8)

## 2019-10-19 LAB — INSULIN, RANDOM: INSULIN: 10 u[IU]/mL (ref 2.6–24.9)

## 2019-10-19 LAB — HEMOGLOBIN A1C
Est. average glucose Bld gHb Est-mCnc: 97 mg/dL
Hgb A1c MFr Bld: 5 % (ref 4.8–5.6)

## 2019-10-19 NOTE — Progress Notes (Signed)
Chief Complaint:   OBESITY Jamie [name] is here to discuss her progress with her obesity treatment plan along with follow-up of her obesity related diagnoses. Jamie [name] is on the Category 2 Plan and states she is following her eating plan approximately 50% of the time. Jamie [name] states she is walking the dog for 20-30 minutes 7 times per week.  Today's visit was #: 15 Starting weight: 222 lbs Starting date: 08/25/2017 Today's weight: 184 lbs Today's date: 10/18/2019 Total lbs lost to date: 30 Total lbs lost since last in-office visit: 0  Interim History: Jamie [name] voices her youngest daughter has moved back in and this has made it difficult to adhere to the meal plan. She wants to get back into exercising at the health club.  Subjective:   1. Insulin resistance Jamie [name]'s last A1c and insulin was on 09/03/2018. She is on metformin.  2. Other iron deficiency anemia Jamie [name] had a previously low BBC and hemoglobin. No previous diagnosis. Her last MCV was within normal limits.  3. Hypercalcemia Jamie [name]'s last Ca was 11.1 and she denies symptoms of elevated Ca.  4. Hyperglycemia Jamie [name] has a history of some elevated blood glucose readings without a diagnosis of diabetes. She denies polyphagia.  Assessment/Plan:   1. Insulin resistance Jasime will continue to work on weight loss, exercise, and decreasing simple carbohydrates to help decrease the risk of diabetes. We will check labs today. Jamie [name] agreed to follow-up with [name] as directed to closely monitor her progress.  - Hemoglobin A1c - Insulin, random  2. Other iron deficiency anemia We will check labs today. Orders and follow up as documented in patient record.  Counseling . Iron is essential for our bodies to make red blood cells.  Reasons that someone may be deficient include: an iron-deficient diet (more likely in those following vegan or vegetarian diets), women with heavy menses, patients with GI disorders or poor absorption, patients that have had  bariatric surgery, frequent blood donors, patients with cancer, and patients with heart disease.   Jamie [name] foods include dark leafy greens, red and white meats, eggs, seafood, and beans.   . Certain foods and drinks prevent your body from absorbing iron properly. Avoid eating these foods in the same meal as iron-rich foods or with iron supplements. These foods include: coffee, black tea, and red wine; milk, dairy products, and foods that are high in calcium; beans and soybeans; whole grains.  . Constipation can be a side effect of iron supplementation. Increased water and fiber intake are helpful. Water goal: > 2 liters/day. Fiber goal: > 25 grams/day.  - CBC with Differential/Platelet  3. Hypercalcemia We will check labs today and will follow up.  - Comprehensive metabolic panel  4. Hyperglycemia Fasting labs will be obtained today and results with be discussed with Jamie [name] in 2 weeks at her follow up visit. In the meanwhile Jamie [name] was started on a lower simple carbohydrate diet and will work on weight loss efforts.  - Hemoglobin A1c - Insulin, random  5. Class 1 obesity with serious comorbidity and body mass index (BMI) of 32.0 to 32.9 in adult, unspecified obesity type Jamie [name] is currently in the action stage of change. As such, her goal is to continue with weight loss efforts. She has agreed to the Category 2 Plan or practicing portion control and making smarter food choices, such as increasing vegetables and decreasing simple carbohydrates.   Exercise goals: As is, Jamie [name] is to restart exercise at the health club. All adults should  avoid inactivity. Some physical activity is better than none, and adults who participate in any amount of physical activity gain some health benefits.  Behavioral modification strategies: increasing lean protein intake, meal planning and cooking strategies, better snacking choices and planning for success.  Jamie [name] has agreed to follow-up with our clinic in 2  weeks. She was informed of the importance of frequent follow-up visits to maximize her success with intensive lifestyle modifications for her multiple health conditions.   Jamie [name] was informed we would discuss her lab results at her next visit unless there is a critical issue that needs to be addressed sooner. Jamie [name] agreed to keep her next visit at the agreed upon time to discuss these results.  Objective:   Blood pressure 105/63, pulse 73, temperature 98.2 F (36.8 C), temperature source Oral, height 5' 3"  (1.6 m), weight 184 lb (83.5 kg), SpO2 99 %. Body mass index is 32.59 kg/m.  General: Cooperative, alert, well developed, in no acute distress. HEENT: Conjunctivae and lids unremarkable. Cardiovascular: Regular rhythm.  Lungs: Normal work of breathing. Neurologic: No focal deficits.   Lab Results  Component Value Date   CREATININE 0.81 09/03/2018   BUN 22 09/03/2018   NA 141 09/03/2018   K 5.1 09/03/2018   CL 101 09/03/2018   CO2 23 09/03/2018   Lab Results  Component Value Date   ALT 16 09/03/2018   AST 17 09/03/2018   ALKPHOS 55 09/03/2018   BILITOT 0.4 09/03/2018   Lab Results  Component Value Date   HGBA1C 5.1 09/03/2018   HGBA1C 5.2 04/01/2018   HGBA1C 5.4 08/25/2017   HGBA1C 5.4 12/16/2011   HGBA1C 5.7 05/03/2011   Lab Results  Component Value Date   INSULIN 5.7 09/03/2018   INSULIN 5.6 04/01/2018   INSULIN 50.3 (H) 08/25/2017   Lab Results  Component Value Date   TSH 1.900 08/25/2017   Lab Results  Component Value Date   CHOL 176 08/25/2017   HDL 84 08/25/2017   LDLCALC 74 08/25/2017   TRIG 91 08/25/2017   CHOLHDL 3 08/24/2010   Lab Results  Component Value Date   WBC 8.1 06/09/2018   HGB 11.6 (L) 06/09/2018   HCT 36.6 06/09/2018   MCV 98.7 06/09/2018   PLT 193 06/09/2018   No results found for: IRON, TIBC, FERRITIN  Obesity Behavioral Intervention Documentation for Insurance:   Approximately 15 minutes were spent on the discussion  below.  ASK: We discussed the diagnosis of obesity with Jamie [name] today and Jamie [name] agreed to give [name] permission to discuss obesity behavioral modification therapy today.  ASSESS: Jamie [name] has the diagnosis of obesity and her BMI today is 32.6. Jamie [name] is in the action stage of change.   ADVISE: Jamie [name] was educated on the multiple health risks of obesity as well as the benefit of weight loss to improve her health. She was advised of the need for long term treatment and the importance of lifestyle modifications to improve her current health and to decrease her risk of future health problems.  AGREE: Multiple dietary modification options and treatment options were discussed and Jamie [name] agreed to follow the recommendations documented in the above note.  ARRANGE: Jamie [name] was educated on the importance of frequent visits to treat obesity as outlined per CMS and USPSTF guidelines and agreed to schedule her next follow up appointment today.  Attestation Statements:   Reviewed by clinician on day of visit: allergies, medications, problem list, medical history, surgical history, family history, social history, and previous encounter  notes.   I, Trixie Dredge, am acting as transcriptionist for April Manson, MD.  I have reviewed the above documentation for accuracy and completeness, and I agree with the above. - Jamie Qua, MD

## 2019-10-20 DIAGNOSIS — Z1231 Encounter for screening mammogram for malignant neoplasm of breast: Secondary | ICD-10-CM | POA: Diagnosis not present

## 2019-10-28 DIAGNOSIS — N6320 Unspecified lump in the left breast, unspecified quadrant: Secondary | ICD-10-CM | POA: Diagnosis not present

## 2019-10-28 DIAGNOSIS — R928 Other abnormal and inconclusive findings on diagnostic imaging of breast: Secondary | ICD-10-CM | POA: Diagnosis not present

## 2019-11-04 ENCOUNTER — Encounter: Payer: Self-pay | Admitting: Internal Medicine

## 2019-11-04 ENCOUNTER — Ambulatory Visit (INDEPENDENT_AMBULATORY_CARE_PROVIDER_SITE_OTHER): Payer: Medicare Other | Admitting: Internal Medicine

## 2019-11-04 VITALS — BP 114/68 | HR 80 | Temp 97.3°F | Ht 63.0 in | Wt 195.4 lb

## 2019-11-04 DIAGNOSIS — K760 Fatty (change of) liver, not elsewhere classified: Secondary | ICD-10-CM | POA: Diagnosis not present

## 2019-11-04 DIAGNOSIS — K589 Irritable bowel syndrome without diarrhea: Secondary | ICD-10-CM

## 2019-11-04 DIAGNOSIS — K219 Gastro-esophageal reflux disease without esophagitis: Secondary | ICD-10-CM

## 2019-11-04 DIAGNOSIS — K5901 Slow transit constipation: Secondary | ICD-10-CM | POA: Diagnosis not present

## 2019-11-04 MED ORDER — ESOMEPRAZOLE MAGNESIUM 40 MG PO CPDR: DELAYED_RELEASE_CAPSULE | ORAL | 3 refills | Status: DC

## 2019-11-04 MED ORDER — LINACLOTIDE 290 MCG PO CAPS: 290.0000 ug | ORAL_CAPSULE | Freq: Every day | ORAL | 3 refills | Status: DC

## 2019-11-04 MED ORDER — SUTAB 1479-225-188 MG PO TABS: 1.0000 | ORAL_TABLET | Freq: Once | ORAL | 0 refills | Status: AC

## 2019-11-04 MED ORDER — CILIDINIUM-CHLORDIAZEPOXIDE 2.5-5 MG PO CAPS: 1.0000 | ORAL_CAPSULE | Freq: Two times a day (BID) | ORAL | 3 refills | Status: DC

## 2019-11-04 NOTE — Patient Instructions (Signed)
We have sent the following medications to your pharmacy for you to pick up at your convenience:  Nexium, Linzess, LIbrax  You have been scheduled for a colonoscopy. Please follow written instructions given to you at your visit today.  Please pick up your prep supplies at the pharmacy within the next 1-3 days. If you use inhalers (even only as needed), please bring them with you on the day of your procedure.

## 2019-11-04 NOTE — Progress Notes (Signed)
HISTORY OF PRESENT ILLNESS:  Jamie [name] is a 74 y.o. female who is followed in this office for adenomatous colon polyps, GERD, fatty liver, and constipation predominant IBS.  Last seen via telehealth medicine July 2020.  See that dictation.  She presents today for routine follow-up.  Chief complaint today is worsening constipation despite Linzess 145 mcg daily.  Occasionally she will take 2 at home.  This seems to help.  Her reflux is stable on Nexium.  Occasional Librax for IBS type pain.  She is started to report that she is gained back 30 pounds after having done an exceptional job with weight loss.  Review of blood work from October 18, 2019 shows normal liver tests.  Normal CBC with hemoglobin 14.4.  CT scan of the abdomen December 2019 with appendicitis for which she subsequently underwent appendectomy.  Her last complete colonoscopy was July 2018.  8 adenomas removed at that time.  Follow-up in 3 years recommended.  She is now due  REVIEW OF SYSTEMS:  All non-GI ROS negative unless otherwise stated in the HPI except for left shoulder blade pain upon wakening, back pain, allergies, urinary leakage  Past Medical History:  Diagnosis Date  . Allergic rhinitis   . Anxiety   . Arthritis    left foot  . Asthma   . Back pain   . Colon polyps    adenomatous  . Constipation   . Diverticulosis   . Dysrhythmia 1999   Dr Johnsie Cancel 2003  . Elevated LFTs   . Fatty liver disease, nonalcoholic   . GERD (gastroesophageal reflux disease)   . Headache(784.0)    migraines with vision changes but no pain  . Heart murmur   . Hypertension   . IBS (irritable bowel syndrome)   . Joint pain   . Migraines   . Muscle cramps   . Obesity   . Palpitations   . Pneumonia 2007  . Renal cell carcinoma ([place]) 2013   minimal partial nephrectomy  . Restless leg syndrome   . Sleep apnea    moderate; wears CPAP, uncertain about setting  . TIA (transient ischemic attack) 09/11/2014  . Ulcer   . Urinary  incontinence   . Vitamin D deficiency     Past Surgical History:  Procedure Laterality Date  . BREAST REDUCTION SURGERY  Jan 1991  . ELBOW SURGERY Left   . KIDNEY SURGERY Right October 2013    Tumor removed   . LAPAROSCOPIC APPENDECTOMY N/A 06/08/2018   Procedure: APPENDECTOMY LAPAROSCOPIC;  Surgeon: Jovita Kussmaul, MD;  Location: [facility];  Service: General;  Laterality: N/A;  . TONSILLECTOMY    . VAGINAL HYSTERECTOMY  1979   partial    Social History Dotty D Commisso  reports that she has never smoked. She has never used smokeless tobacco. She reports current alcohol use of about 2.0 - 3.0 standard drinks of alcohol per week. She reports that she does not use drugs.  family history includes Allergies in her brother and daughter; Anxiety disorder in her mother; Colon cancer in her cousin; Heart disease in her brother; Heart disease (age of onset: 25) in her father; Hypertension in her mother and another family member; Lung cancer in her father and sister; Melanoma in her daughter.  Allergies  Allergen Reactions  . Buprenorphine Hcl Rash and Shortness Of Breath  . Morphine And Related Shortness Of Breath and Rash  . Morphine And Related Shortness Of Breath  . Aspirin Other (See Comments)  REACTION: ulcers  Can take 81 mg  . Avelox [Moxifloxacin Hcl In Nacl] Hives  . Diflucan [Fluconazole] Hives    Whelps and rash c skin peeling  . Diflucan [Fluconazole] Hives  . Latex Rash  . Moxifloxacin Palpitations    Tachycardia        PHYSICAL EXAMINATION: Vital signs: BP 114/68 (BP Location: Left Arm, Patient Position: Sitting, Cuff Size: Large)   Pulse 80   Temp (!) 97.3 F (36.3 C)   Ht 5' 3"  (1.6 m)   Wt 195 lb 6 oz (88.6 kg)   SpO2 97%   BMI 34.61 kg/m   Constitutional: generally well-appearing, no acute distress Psychiatric: alert and oriented x3, cooperative Eyes: extraocular movements intact, anicteric, conjunctiva pink Mouth: oral pharynx moist, no lesions Neck:  supple no lymphadenopathy Cardiovascular: heart regular rate and rhythm, no murmur Lungs: clear to auscultation bilaterally Abdomen: soft, nontender, nondistended, no obvious ascites, no peritoneal signs, normal bowel sounds, no organomegaly Rectal: Deferred until colonoscopy Extremities: no clubbing, cyanosis, or lower extremity edema bilaterally Skin: no lesions on visible extremities Neuro: No focal deficits.  Cranial nerves intact  ASSESSMENT:  1.  Constipation.  Worsening. 2.  IBS. 3.  GERD.  Stable on Nexium 4.  Multiple adenomatous colon polyps.  Due for surveillance 5.  Obesity with history of fatty liver.  Recent weight gain during Covid   PLAN:  1.  Change Linzess prescription to 290 mcg daily.  Medication risks reviewed 2.  Refill Nexium.  Medication risks reviewed 3.  Refill Librax.  Medication risks reviewed 4.  Reflux precautions 5.  Weight loss and exercise 6.  Schedule surveillance colonoscopy.The nature of the procedure, as well as the risks, benefits, and alternatives were carefully and thoroughly reviewed with the patient. Ample time for discussion and questions allowed. The patient understood, was satisfied, and agreed to proceed. 7.  Routine office follow-up 1 year

## 2019-11-09 ENCOUNTER — Ambulatory Visit (INDEPENDENT_AMBULATORY_CARE_PROVIDER_SITE_OTHER): Payer: Medicare Other | Admitting: Bariatrics

## 2019-11-18 ENCOUNTER — Ambulatory Visit (INDEPENDENT_AMBULATORY_CARE_PROVIDER_SITE_OTHER): Payer: Medicare Other | Admitting: Bariatrics

## 2019-11-22 DIAGNOSIS — I10 Essential (primary) hypertension: Secondary | ICD-10-CM | POA: Diagnosis not present

## 2019-11-22 DIAGNOSIS — E875 Hyperkalemia: Secondary | ICD-10-CM | POA: Diagnosis not present

## 2019-11-22 DIAGNOSIS — R7303 Prediabetes: Secondary | ICD-10-CM | POA: Diagnosis not present

## 2019-11-24 DIAGNOSIS — G4733 Obstructive sleep apnea (adult) (pediatric): Secondary | ICD-10-CM | POA: Diagnosis not present

## 2019-11-24 DIAGNOSIS — Z0000 Encounter for general adult medical examination without abnormal findings: Secondary | ICD-10-CM | POA: Diagnosis not present

## 2019-11-24 DIAGNOSIS — R7303 Prediabetes: Secondary | ICD-10-CM | POA: Diagnosis not present

## 2019-11-24 DIAGNOSIS — K219 Gastro-esophageal reflux disease without esophagitis: Secondary | ICD-10-CM | POA: Diagnosis not present

## 2019-12-14 ENCOUNTER — Encounter: Payer: Self-pay | Admitting: Internal Medicine

## 2019-12-14 ENCOUNTER — Other Ambulatory Visit: Payer: Self-pay | Admitting: Internal Medicine

## 2019-12-14 ENCOUNTER — Other Ambulatory Visit: Payer: Self-pay

## 2019-12-14 ENCOUNTER — Ambulatory Visit (AMBULATORY_SURGERY_CENTER): Payer: Medicare Other | Admitting: Internal Medicine

## 2019-12-14 VITALS — BP 110/64 | HR 66 | Temp 97.3°F | Resp 15 | Ht 63.0 in | Wt 195.0 lb

## 2019-12-14 DIAGNOSIS — Z1211 Encounter for screening for malignant neoplasm of colon: Secondary | ICD-10-CM | POA: Diagnosis not present

## 2019-12-14 DIAGNOSIS — D124 Benign neoplasm of descending colon: Secondary | ICD-10-CM | POA: Diagnosis not present

## 2019-12-14 DIAGNOSIS — K589 Irritable bowel syndrome without diarrhea: Secondary | ICD-10-CM

## 2019-12-14 DIAGNOSIS — Z86010 Personal history of colonic polyps: Secondary | ICD-10-CM | POA: Diagnosis not present

## 2019-12-14 MED ORDER — SODIUM CHLORIDE 0.9 % IV SOLN: 500.0000 mL | Freq: Once | INTRAVENOUS | Status: DC

## 2019-12-14 NOTE — Op Note (Signed)
[facility] Patient Name: Jamie [name] Procedure Date: 12/14/2019 1:31 PM MRN: [id number] Endoscopist: Docia Chuck. Henrene Pastor , MD Age: 74 Referring MD:  Date of Birth: 13-Dec-1945 Gender: Female Account #: [id number] Procedure:                Colonoscopy with cold snare polypectomy x 1 Indications:              High risk colon cancer surveillance: Personal                            history of multiple (3 or more) adenomas. Previous                            examinations 1999, 2004, 2008, 2013, 2018 Medicines:                Monitored Anesthesia Care Procedure:                Pre-Anesthesia Assessment:                           - Prior to the procedure, a History and Physical                            was performed, and patient medications and                            allergies were reviewed. The patient's tolerance of                            previous anesthesia was also reviewed. The risks                            and benefits of the procedure and the sedation                            options and risks were discussed with the patient.                            All questions were answered, and informed consent                            was obtained. Prior Anticoagulants: The patient has                            taken no previous anticoagulant or antiplatelet                            agents. ASA Grade Assessment: II - A patient with                            mild systemic disease. After reviewing the risks                            and benefits, the patient was deemed in  satisfactory condition to undergo the procedure.                           After obtaining informed consent, the colonoscope                            was passed under direct vision. Throughout the                            procedure, the patient's blood pressure, pulse, and                            oxygen saturations were monitored continuously. The            Colonoscope was introduced through the anus and                            advanced to the the cecum, identified by                            appendiceal orifice and ileocecal valve. The                            ileocecal valve, appendiceal orifice, and rectum                            were photographed. The quality of the bowel                            preparation was excellent. The colonoscopy was                            performed without difficulty. The patient tolerated                            the procedure well. The bowel preparation used was                            SUPREP via split dose instruction. Scope In: 1:38:51 PM Scope Out: 1:54:07 PM Scope Withdrawal Time: 0 hours 11 minutes 11 seconds  Total Procedure Duration: 0 hours 15 minutes 16 seconds  Findings:                 A 3 mm polyp was found in the descending colon. The                            polyp was removed with a cold snare. Resection and                            retrieval were complete.                           Diverticula were found in the sigmoid colon.  Internal hemorrhoids were found during retroflexion.                           The exam was otherwise without abnormality on                            direct and retroflexion views. Complications:            No immediate complications. Estimated blood loss:                            None. Estimated Blood Loss:     Estimated blood loss: none. Impression:               - One 3 mm polyp in the descending colon, removed                            with a cold snare. Resected and retrieved.                           - Diverticulosis in the sigmoid colon.                           - Internal hemorrhoids.                           - The examination was otherwise normal on direct                            and retroflexion views. Recommendation:           - Repeat colonoscopy in 5 years for surveillance.                            - Patient has a contact number available for                            emergencies. The signs and symptoms of potential                            delayed complications were discussed with the                            patient. Return to normal activities tomorrow.                            Written discharge instructions were provided to the                            patient.                           - Resume previous diet.                           - Continue present medications.                           -  Await pathology results. Docia Chuck. Henrene Pastor, MD 12/14/2019 1:59:35 PM This report has been signed electronically.

## 2019-12-14 NOTE — Progress Notes (Signed)
Vitals-CW  Patient denies being a diabetic.  States can't eat a lot of carbs.  States hx of hypoglycemia.

## 2019-12-14 NOTE — Progress Notes (Signed)
Called to room to assist during endoscopic procedure.  Patient ID and intended procedure confirmed with present staff. Received instructions for my participation in the procedure from the performing physician.  

## 2019-12-14 NOTE — Patient Instructions (Signed)
YOU HAD AN ENDOSCOPIC PROCEDURE TODAY AT [facility]:   Refer to the procedure report that was given to you for any specific questions about what was found during the examination.  If the procedure report does not answer your questions, please call your gastroenterologist to clarify.  If you requested that your care partner not be given the details of your procedure findings, then the procedure report has been included in a sealed envelope for you to review at your convenience later.  YOU SHOULD EXPECT: Some feelings of bloating in the abdomen. Passage of more gas than usual.  Walking can help get rid of the air that was put into your GI tract during the procedure and reduce the bloating. If you had a lower endoscopy (such as a colonoscopy or flexible sigmoidoscopy) you may notice spotting of blood in your stool or on the toilet paper. If you underwent a bowel prep for your procedure, you may not have a normal bowel movement for a few days.  Please Note:  You might notice some irritation and congestion in your nose or some drainage.  This is from the oxygen used during your procedure.  There is no need for concern and it should clear up in a day or so.  SYMPTOMS TO REPORT IMMEDIATELY:   Following lower endoscopy (colonoscopy or flexible sigmoidoscopy):  Excessive amounts of blood in the stool  Significant tenderness or worsening of abdominal pains  Swelling of the abdomen that is new, acute  Fever of 100F or higher   For urgent or emergent issues, a gastroenterologist can be reached at any hour by calling [phone]. Do not use MyChart messaging for urgent concerns.    DIET:  We do recommend a small meal at first, but then you may proceed to your regular diet.  Drink plenty of fluids but you should avoid alcoholic beverages for 24 hours.  MEDICATIONS: Continue present medications.  Please see handouts given to you by your recovery nurse.  ACTIVITY:  You should plan to  take it easy for the rest of today and you should NOT DRIVE or use heavy machinery until tomorrow (because of the sedation medicines used during the test).    FOLLOW UP: Our staff will call the number listed on your records 48-72 hours following your procedure to check on you and address any questions or concerns that you may have regarding the information given to you following your procedure. If we do not reach you, we will leave a message.  We will attempt to reach you two times.  During this call, we will ask if you have developed any symptoms of COVID 19. If you develop any symptoms (ie: fever, flu-like symptoms, shortness of breath, cough etc.) before then, please call [phone].  If you test positive for Covid 19 in the 2 weeks post procedure, please call and report this information to [name].    If any biopsies were taken you will be contacted by phone or by letter within the next 1-3 weeks.  Please call us at [phone] if you have not heard about the biopsies in 3 weeks.   Thank you for allowing [name] to provide for your healthcare needs today.   SIGNATURES/CONFIDENTIALITY: You and/or your care partner have signed paperwork which will be entered into your electronic medical record.  These signatures attest to the fact that that the information above on your After Visit Summary has been reviewed and is understood.  Full responsibility of the  confidentiality of this discharge information lies with you and/or your care-partner.

## 2019-12-14 NOTE — Progress Notes (Signed)
A/ox3, pleased with MAC, report to RN 

## 2019-12-16 ENCOUNTER — Telehealth: Payer: Self-pay

## 2019-12-16 NOTE — Telephone Encounter (Signed)
  Follow up Call-  Call back number 12/14/2019  Post procedure Call Back phone  # [phone]  Permission to leave phone message Yes  Some recent data might be hidden     Patient questions:  Do you have a fever, pain , or abdominal swelling? No. Pain Score  0 *  Have you tolerated food without any problems? Yes.    Have you been able to return to your normal activities? Yes.    Do you have any questions about your discharge instructions: Diet   No. Medications  No. Follow up visit  No.  Do you have questions or concerns about your Care? No.  Actions: * If pain score is 4 or above: No action needed, pain <4.  1. Have you developed a fever since your procedure? no  2.   Have you had an respiratory symptoms (SOB or cough) since your procedure? no  3.   Have you tested positive for COVID 19 since your procedure no  4.   Have you had any family members/close contacts diagnosed with the COVID 19 since your procedure?  no   If yes to any of these questions please route to Joylene John, RN and Erenest Rasher, RN

## 2019-12-17 ENCOUNTER — Encounter: Payer: Self-pay | Admitting: Internal Medicine

## 2020-01-06 ENCOUNTER — Telehealth: Payer: Self-pay | Admitting: Family Medicine

## 2020-01-06 ENCOUNTER — Other Ambulatory Visit: Payer: Self-pay | Admitting: Family Medicine

## 2020-01-06 MED ORDER — NURTEC 75 MG PO TBDP
75.0000 mg | ORAL_TABLET | Freq: Every day | ORAL | 11 refills | Status: DC | PRN
Start: 2020-01-06 — End: 2021-12-04

## 2020-01-06 MED ORDER — NURTEC 75 MG PO TBDP
75.0000 mg | ORAL_TABLET | Freq: Every day | ORAL | 11 refills | Status: DC | PRN
Start: 2020-01-06 — End: 2020-01-06

## 2020-01-06 NOTE — Addendum Note (Signed)
Addended byUbaldo Glassing, Westly Hinnant L on: 01/06/2020 01:08 PM   Modules accepted: Orders

## 2020-01-06 NOTE — Telephone Encounter (Signed)
Amy- do you have any recommendations?

## 2020-01-06 NOTE — Progress Notes (Signed)
nurtec 

## 2020-01-06 NOTE — Telephone Encounter (Addendum)
Called pt back. Relayed AL,NP message. She is agreeable to try nurtec. She already spoke with pharmacy and aware it needs PA. Advised we will work on this for her. She verbalized understanding.  I called [facility] to get pt pharmacy benefit info. They were closed for lunch at 2:06pm.  Was able to initiate PA on CMM with [employer]. Key: [place]. Submitted for 8/30days. Waiting on determination from [facility].  AL,NP resent rx for #8/30days d/t QL with insurance.

## 2020-01-06 NOTE — Telephone Encounter (Signed)
Received fax from [employer] [facility] approved pa request for Nurtec until 07/07/2098. Additional questions? [phone] for [facility] program

## 2020-01-06 NOTE — Telephone Encounter (Signed)
Patient had a very bad migraine yesterday she was in bed all day . Patient has up coming apt and  Would like something to take if she gets another bad headache before she come in for apt. [place]

## 2020-02-09 ENCOUNTER — Ambulatory Visit (INDEPENDENT_AMBULATORY_CARE_PROVIDER_SITE_OTHER): Payer: Medicare Other | Admitting: Family Medicine

## 2020-02-09 ENCOUNTER — Encounter: Payer: Self-pay | Admitting: Family Medicine

## 2020-02-09 VITALS — BP 127/70 | HR 81 | Wt 188.0 lb

## 2020-02-09 DIAGNOSIS — Z9989 Dependence on other enabling machines and devices: Secondary | ICD-10-CM

## 2020-02-09 DIAGNOSIS — G43709 Chronic migraine without aura, not intractable, without status migrainosus: Secondary | ICD-10-CM | POA: Diagnosis not present

## 2020-02-09 DIAGNOSIS — G4733 Obstructive sleep apnea (adult) (pediatric): Secondary | ICD-10-CM

## 2020-02-09 DIAGNOSIS — IMO0002 Reserved for concepts with insufficient information to code with codable children: Secondary | ICD-10-CM

## 2020-02-09 NOTE — Patient Instructions (Signed)
Please continue using your CPAP regularly. While your insurance requires that you use CPAP at least 4 hours each night on 70% of the nights, I recommend, that you not skip any nights and use it throughout the night if you can. Getting used to CPAP and staying with the treatment long term does take time and patience and discipline. Untreated obstructive sleep apnea when it is moderate to severe can have an adverse impact on cardiovascular health and raise her risk for heart disease, arrhythmias, hypertension, congestive heart failure, stroke and diabetes. Untreated obstructive sleep apnea causes sleep disruption, nonrestorative sleep, and sleep deprivation. This can have an impact on your day to day functioning and cause daytime sleepiness and impairment of cognitive function, memory loss, mood disturbance, and problems focussing. Using CPAP regularly can improve these symptoms.  Please call [employer] to get your compliance data updated. I will ask [employer] to send [name] a report asap.   Continue Nurtec as needed.   Follow up in 1 year, sooner if needed    Migraine Headache A migraine headache is a very strong throbbing pain on one side or both sides of your head. This type of headache can also cause other symptoms. It can last from 4 hours to 3 days. Talk with your doctor about what things may bring on (trigger) this condition. What are the causes? The exact cause of this condition is not known. This condition may be triggered or caused by:  Drinking alcohol.  Smoking.  Taking medicines, such as: ? Medicine used to treat chest pain (nitroglycerin). ? Birth control pills. ? Estrogen. ? Some blood pressure medicines.  Eating or drinking certain products.  Doing physical activity. Other things that may trigger a migraine headache include:  Having a menstrual period.  Pregnancy.  Hunger.  Stress.  Not getting enough sleep or getting too much sleep.  Weather changes.  Tiredness  (fatigue). What increases the risk?  Being 23-63 years old.  Being female.  Having a family history of migraine headaches.  Being Caucasian.  Having depression or anxiety.  Being very overweight. What are the signs or symptoms?  A throbbing pain. This pain may: ? Happen in any area of the head, such as on one side or both sides. ? Make it hard to do daily activities. ? Get worse with physical activity. ? Get worse around bright lights or loud noises.  Other symptoms may include: ? Feeling sick to your stomach (nauseous). ? Vomiting. ? Dizziness. ? Being sensitive to bright lights, loud noises, or smells.  Before you get a migraine headache, you may get warning signs (an aura). An aura may include: ? Seeing flashing lights or having blind spots. ? Seeing bright spots, halos, or zigzag lines. ? Having tunnel vision or blurred vision. ? Having numbness or a tingling feeling. ? Having trouble talking. ? Having weak muscles.  Some people have symptoms after a migraine headache (postdromal phase), such as: ? Tiredness. ? Trouble thinking (concentrating). How is this treated?  Taking medicines that: ? Relieve pain. ? Relieve the feeling of being sick to your stomach. ? Prevent migraine headaches.  Treatment may also include: ? Having acupuncture. ? Avoiding foods that bring on migraine headaches. ? Learning ways to control your body functions (biofeedback). ? Therapy to help you know and deal with negative thoughts (cognitive behavioral therapy). Follow these instructions at home: Medicines  Take over-the-counter and prescription medicines only as told by your doctor.  Ask your doctor if the  medicine prescribed to you: ? Requires you to avoid driving or using heavy machinery. ? Can cause trouble pooping (constipation). You may need to take these steps to prevent or treat trouble pooping:  Drink enough fluid to keep your pee (urine) pale yellow.  Take  over-the-counter or prescription medicines.  Eat foods that are high in fiber. These include beans, whole grains, and fresh fruits and vegetables.  Limit foods that are high in fat and sugar. These include fried or sweet foods. Lifestyle  Do not drink alcohol.  Do not use any products that contain nicotine or tobacco, such as cigarettes, e-cigarettes, and chewing tobacco. If you need help quitting, ask your doctor.  Get at least 8 hours of sleep every night.  Limit and deal with stress. General instructions      Keep a journal to find out what may bring on your migraine headaches. For example, write down: ? What you eat and drink. ? How much sleep you get. ? Any change in what you eat or drink. ? Any change in your medicines.  If you have a migraine headache: ? Avoid things that make your symptoms worse, such as bright lights. ? It may help to lie down in a dark, quiet room. ? Do not drive or use heavy machinery. ? Ask your doctor what activities are safe for you.  Keep all follow-up visits as told by your doctor. This is important. Contact a doctor if:  You get a migraine headache that is different or worse than others you have had.  You have more than 15 headache days in one month. Get help right away if:  Your migraine headache gets very bad.  Your migraine headache lasts longer than 72 hours.  You have a fever.  You have a stiff neck.  You have trouble seeing.  Your muscles feel weak or like you cannot control them.  You start to lose your balance a lot.  You start to have trouble walking.  You pass out (faint).  You have a seizure. Summary  A migraine headache is a very strong throbbing pain on one side or both sides of your head. These headaches can also cause other symptoms.  This condition may be treated with medicines and changes to your lifestyle.  Keep a journal to find out what may bring on your migraine headaches.  Contact a doctor if you  get a migraine headache that is different or worse than others you have had.  Contact your doctor if you have more than 15 headache days in a month. This information is not intended to replace advice given to you by your health care provider. Make sure you discuss any questions you have with your health care provider. Document Revised: 10/16/2018 Document Reviewed: 08/06/2018 Elsevier Patient Education  [place].   Sleep Apnea Sleep apnea affects breathing during sleep. It causes breathing to stop for a short time or to become shallow. It can also increase the risk of:  Heart attack.  Stroke.  Being very overweight (obese).  Diabetes.  Heart failure.  Irregular heartbeat. The goal of treatment is to help you breathe normally again. What are the causes? There are three kinds of sleep apnea:  Obstructive sleep apnea. This is caused by a blocked or collapsed airway.  Central sleep apnea. This happens when the brain does not send the right signals to the muscles that control breathing.  Mixed sleep apnea. This is a combination of obstructive and central sleep  apnea. The most common cause of this condition is a collapsed or blocked airway. This can happen if:  Your throat muscles are too relaxed.  Your tongue and tonsils are too large.  You are overweight.  Your airway is too small. What increases the risk?  Being overweight.  Smoking.  Having a small airway.  Being older.  Being female.  Drinking alcohol.  Taking medicines to calm yourself (sedatives or tranquilizers).  Having family members with the condition. What are the signs or symptoms?  Trouble staying asleep.  Being sleepy or tired during the day.  Getting angry a lot.  Loud snoring.  Headaches in the morning.  Not being able to focus your mind (concentrate).  Forgetting things.  Less interest in sex.  Mood swings.  Personality changes.  Feelings of sadness  (depression).  Waking up a lot during the night to pee (urinate).  Dry mouth.  Sore throat. How is this diagnosed?  Your medical history.  A physical exam.  A test that is done when you are sleeping (sleep study). The test is most often done in a sleep lab but may also be done at home. How is this treated?   Sleeping on your side.  Using a medicine to get rid of mucus in your nose (decongestant).  Avoiding the use of alcohol, medicines to help you relax, or certain pain medicines (narcotics).  Losing weight, if needed.  Changing your diet.  Not smoking.  Using a machine to open your airway while you sleep, such as: ? An oral appliance. This is a mouthpiece that shifts your lower jaw forward. ? A CPAP device. This device blows air through a mask when you breathe out (exhale). ? An EPAP device. This has valves that you put in each nostril. ? A BPAP device. This device blows air through a mask when you breathe in (inhale) and breathe out.  Having surgery if other treatments do not work. It is important to get treatment for sleep apnea. Without treatment, it can lead to:  High blood pressure.  Coronary artery disease.  In men, not being able to have an erection (impotence).  Reduced thinking ability. Follow these instructions at home: Lifestyle  Make changes that your doctor recommends.  Eat a healthy diet.  Lose weight if needed.  Avoid alcohol, medicines to help you relax, and some pain medicines.  Do not use any products that contain nicotine or tobacco, such as cigarettes, e-cigarettes, and chewing tobacco. If you need help quitting, ask your doctor. General instructions  Take over-the-counter and prescription medicines only as told by your doctor.  If you were given a machine to use while you sleep, use it only as told by your doctor.  If you are having surgery, make sure to tell your doctor you have sleep apnea. You may need to bring your device with  you.  Keep all follow-up visits as told by your doctor. This is important. Contact a doctor if:  The machine that you were given to use during sleep bothers you or does not seem to be working.  You do not get better.  You get worse. Get help right away if:  Your chest hurts.  You have trouble breathing in enough air.  You have an uncomfortable feeling in your back, arms, or stomach.  You have trouble talking.  One side of your body feels weak.  A part of your face is hanging down. These symptoms may be an emergency. Do not  wait to see if the symptoms will go away. Get medical help right away. Call your local emergency services (911 in the [place] Do not drive yourself to the hospital. Summary  This condition affects breathing during sleep.  The most common cause is a collapsed or blocked airway.  The goal of treatment is to help you breathe normally while you sleep. This information is not intended to replace advice given to you by your health care provider. Make sure you discuss any questions you have with your health care provider. Document Revised: 04/10/2018 Document Reviewed: 02/17/2018 Elsevier Patient Education  [place].

## 2020-02-09 NOTE — Progress Notes (Addendum)
PATIENT: Jamie [name] DOB: June 01, 1946  REASON FOR VISIT: follow up HISTORY FROM: patient  Chief Complaint  Patient presents with   Follow-up    Rm 2 here for a osa f/u.     HISTORY OF PRESENT ILLNESS: Today 02/09/20 Jamie [name] is a 74 y.o. female here today for follow up for OSA on CPAP and headaches. Nurtec was started in June for abortive therapy.  She reports that headaches are well managed.  Nurtec has worked well.  She continues CPAP therapy.  She did receive her new machine last year, however, she reports that it makes an unusual whistling sound.  She has not used her new CPAP and continues to use her old machine.  Unfortunately, we do not have a compliance report to review for today's visit.  She states that she is using CPAP every night.  She denies any concerns with her old machine with the exception of having to restart CPAP from time to time.  She notes significant benefit of using CPAP therapy.  HISTORY: (copied from my note on 02/09/2019)  Jamie [name] is a 74 y.o. female here today for follow up for OSA on CPAP. She is doing well with compliance. She is concerned that her machine is not working correctly. She reports that when she tries to power up the machine, it may take 2-3 tries.  She is concerned that eventually machine will completely give out.  Last sleep study was in 2012.  She feels that her CPAP is approximately that old.  She is using CPAP nightly.  She reports that she cannot sleep without it.    HISTORY: (copied from [employer] note on 01/19/2018)  (CD)Is and is here today to be evaluated for a headache, and she reports that she first had migrainous headaches when she resumed birth control pills over 40 years ago. The headaches were very debilitating at the time but there were infrequent through the 1970s. Once she discontinued hormonal contraception her headaches became as infrequent as once a year. In the early 1980s the couple lived in  [place] and Jamie [name] recalls that the bright light was a trigger for headaches -there was a strong photophobic component, and she made many trips to the local hospital for Demerol shots. She underwent a self hypnosis treatment and this worked well, controlled the migraine for years. On 9-20 02-2013 the patient woke up with a severe explosive kind of pain that seemed to arise from the right eye. The pain was centered retro-orbital and radiated backwards into the center of the head. The patient tried aspirin and Motrin with no benefit at the time as my colleague Dr. Janann Colonel D.O. did document.  This was different from her typical migraines. She was extremely nauseated but she could not vomit and developed dry heaves. The headache broker out of sound sleep at about about 3 AM. She had no tearing of the eye ( that she could remember), the nose was not runny and her face was not flushe.She was asleep with her CPAP.  The patient had an eye exam in 2013 which was reportedly normal at that time and her headaches were evaluated with a brain MRI which was also reviewed and found unremarkable. Not had a number of the severe headache spell since. On 09-12-14 the patient again developed a headache this time of a new quality. She was watching TV at about 11 PM she noticed that the right vision was fluctuating she states that  her vision was disturbed by changing in light and dark sensation, that she describedas if it checker board was placed in front of her eyes. By 11.45 , she tried to answer an e mail, couldn't decipher the message, non-sensical to her. She switched and tried to read aloud, she couldn't , her speech output was not correlated to the text she read. Jibberish, while her normal speech was unaffected. On the left side this time by migraines have always attack the right side of her head. This was unusual as well. The entire episode lasted less than one hour. The emergency room obtained a CT head , non contrast,  read as normal and reviewed here in imaging file. Her BP was very high.  She sleeps with a CPAP, and likes it very much, she had nocturia before being diagnosed with OSA.  Her pneumonia related hospitalization in 2007 was the cause for the test by [facility], as her RN had witnessed her sleep apnea. Dr .Jamie [name] follows her, Dr Constance Holster is her ENT.   Interval history from 03/27/2017CD. Jamie [name] is doing very well she has had one of the severe headaches she used to describe in the past, -onset at about 5:30 after working on the computer. She developed a severe right-sided headache. Afraid this could be another TIA I have recommended her to go to the emergency room, she was given morphine for the headaches which then caused another reaction and side effects. She did not have any speech arrest with this one. The emergency room did not find anything abnormal, she underwent a CT scan of the brain, which was normal, MRI brain was normal. I'm also able today to just review a download of her CPAP machine which has been followed by Dr. Shelia Media but not in the last 3 years. She has 100% compliance for days 100% compliance for over 4 hours of daily use, average user time is 9 hours 7 minutes set pressure is 12 cm water no EPR is used residual AHI is 0.8. Excellent result. Her obesity was the same. No weight loss.  She was given a prescrition of Fioricet and ondansetron also known as Zofran. She's not supposed to take the medication before the headache component has started.  UPDATE 03/14/2018CM Jamie [name], 74 year old female returns for follow-up.She has not had further stroke or TIA episodes. She denies any recent falls She had a couple episodes of blurred vision without headache. She has not had any speech arrest, weakness on one side or the other etc. She continues to use her CPAP every night, however she took that she about 2 weeks ago and we are unable to get a download. She remains on enteric-coated aspirin. She  continues to get little exercise due to her chronic vertigo. Her transcranial Dopplers showed minimal PFO not likely to be clinically significant. She has been evaluated at [facility] for her chronic vertigo. Her husband reports several instances since last seen when she fell asleep at the computer without her CPAP and stopped breathing. Once he got oxygen to her she woke up and was fine. She returns for reevaluation.  UPDATE 06/14/2018CMMs. Kist, 74 year old female returns for follow-up she has a history of TIA and is currently on aspirin for secondary stroke prevention. Blood pressure in the office today 105/65. She continues to take at home and generally in the 462 systolic range. Patient was seen in the emergency room on 11/07/2016 for headache in the left unilateral region the pain did not radiate no nausea  and vomiting. She had some visual blurring and has a history of similar headaches She was given Reglan and Benadryl. She is having maybe 2 headaches not enough for a preventive medication. She says prescription strength Motrin takes the headache away worst part is the nausea for which she has nothing to take. She denies any speech arrest weakness on one side or the other. She continues to be compliant with her CPAP download was obtained today. Greater than 4 hours use 97% or 29 days. Average usage not hours 13 minutes at 12 cm pressure. AHI 0.8 She returns for reevaluation  04/22/17 CDIhave the pleasure of seeing Mrs. Menchaca today on 04/22/2017, and a routine follow up for CPAP compliance. She has done excellently with her CPAP machine which is set at 12 cm water pressure without expiratory pressure relief. Her AHI is 0.7 with an average user time of 9 hours and 2 minutes each night. She had a 97% compliance, power was lost first a night due to [place]. [facility]- will ask for a battery back up- Linzie Collin, RPSGT.  UPDATE 7/15/2019CMMs. Hantz, 74 year old female returns  for follow-up for CPAP compliance. She had a motor change in her machine. Compliance data dated 12/20/2017 01/18/2018 shows compliance greater than 4 hours at 100%. Average usage 9 hours 22 minutes. Set pressure 12 cm EPR level 1.no leak or AHIdatarecorded on the report.ESS 2 she returns for reevaluation   REVIEW OF SYSTEMS: Out of a complete 14 system review of symptoms, the patient complains only of the following symptoms, headaches and all other reviewed systems are negative.    ALLERGIES: Allergies  Allergen Reactions   Buprenorphine Hcl Rash and Shortness Of Breath   Morphine And Related Shortness Of Breath and Rash   Morphine And Related Shortness Of Breath   Aspirin Other (See Comments)    REACTION: ulcers  Can take 81 mg   Avelox [Moxifloxacin Hcl In Nacl] Hives   Diflucan [Fluconazole] Hives    Whelps and rash c skin peeling   Diflucan [Fluconazole] Hives   Latex Rash   Moxifloxacin Palpitations    Tachycardia     HOME MEDICATIONS: Outpatient Medications Prior to Visit  Medication Sig Dispense Refill   Ascorbic Acid (VITAMIN C) 1000 MG tablet Take 1,000 mg by mouth daily.      aspirin EC 81 MG tablet Take 1 tablet (81 mg total) by mouth daily. 90 tablet 3   BIOTIN PO Take 1 capsule by mouth daily.      Cholecalciferol (VITAMIN D3) 2000 units TABS Take 2,000 Units by mouth daily.      clidinium-chlordiazePOXIDE (LIBRAX) 5-2.5 MG capsule Take 1 capsule by mouth 2 (two) times daily. 180 capsule 3   clonazePAM (KLONOPIN) 1 MG tablet Take 0.5 mg by mouth daily as needed (restless leg syndrome).      conjugated estrogens (PREMARIN) vaginal cream Place 1 Applicatorful vaginally every other day.      diphenhydrAMINE (BENADRYL) 25 MG tablet Take 25 mg by mouth daily as needed for allergies.      esomeprazole (NEXIUM) 40 MG capsule TAKE 1 CAPSULE TWICE A DAY BEFORE MEALS 180 capsule 3   linaclotide (LINZESS) 290 MCG CAPS capsule Take 1 capsule (290 mcg  total) by mouth daily before breakfast. 90 capsule 3   metaxalone (SKELAXIN) 800 MG tablet Take 400 mg by mouth daily as needed for muscle spasms.     metFORMIN (GLUCOPHAGE) 500 MG tablet Take 1 tablet (500 mg total) by mouth daily  with breakfast. 60 tablet 0   Omega-3-6-9 CAPS Take 1 capsule by mouth every evening.     Prenatal Vit-Fe Fumarate-FA (PRENATAL MULTIVITAMIN) TABS tablet Take 1 tablet by mouth every evening.      Probiotic Product (PROBIOTIC-10 PO) Take 1 capsule by mouth 2 (two) times daily.      Rimegepant Sulfate (NURTEC) 75 MG TBDP Take 75 mg by mouth daily as needed (take for abortive therapy of migraine, no more than 1 tablet in 24 hours or 10 per month). 8 tablet 11   spironolactone (ALDACTONE) 50 MG tablet Take 50 mg by mouth daily.      Facility-Administered Medications Prior to Visit  Medication Dose Route Frequency Provider Last Rate Last Admin   0.9 %  sodium chloride infusion  500 mL Intravenous Once Irene Shipper, MD        PAST MEDICAL HISTORY: Past Medical History:  Diagnosis Date   Allergic rhinitis    Anxiety    Arthritis    left foot   Asthma    Back pain    Blood transfusion without reported diagnosis    Colon polyps    adenomatous   Constipation    Diverticulosis    Dysrhythmia 1999   Dr Johnsie Cancel 2003   Elevated LFTs    Fatty liver disease, nonalcoholic    GERD (gastroesophageal reflux disease)    Headache(784.0)    migraines with vision changes but no pain   Heart murmur    Hypertension    IBS (irritable bowel syndrome)    Joint pain    Migraines    Muscle cramps    Neuromuscular disorder (HCC)    RLS   Obesity    Palpitations    Pneumonia 2007   Renal cell carcinoma ([place]) 2013   minimal partial nephrectomy   Restless leg syndrome    Sleep apnea    moderate; wears CPAP, uncertain about setting   TIA (transient ischemic attack) 09/11/2014   Ulcer    Urinary incontinence    Vitamin D deficiency       PAST SURGICAL HISTORY: Past Surgical History:  Procedure Laterality Date   BREAST REDUCTION SURGERY  Jan 1991   ELBOW SURGERY Left    KIDNEY SURGERY Right October 2013    Tumor removed    LAPAROSCOPIC APPENDECTOMY N/A 06/08/2018   Procedure: APPENDECTOMY LAPAROSCOPIC;  Surgeon: Jovita Kussmaul, MD;  Location: [facility];  Service: General;  Laterality: N/A;   TONSILLECTOMY     VAGINAL HYSTERECTOMY  1979   partial    FAMILY HISTORY: Family History  Problem Relation Age of Onset   Heart disease Father 20   Lung cancer Father        smoker   Hypertension Mother    Anxiety disorder Mother    Heart disease Brother    Hypertension Other    Colon cancer Cousin    Allergies Daughter    Allergies Brother    Melanoma Daughter    Lung cancer Sister    Liver disease Neg Hx    Asthma Neg Hx    Esophageal cancer Neg Hx    Rectal cancer Neg Hx    Stomach cancer Neg Hx    Liver cancer Neg Hx     SOCIAL HISTORY: Social History   Socioeconomic History   Marital status: Married    Spouse name: Sallyanne Kuster   Number of children: 2   Years of education: 15   Highest education level: Not on  file  Occupational History   Occupation: Retired    Fish farm manager: OTHER    Comment: real estate agent  Tobacco Use   Smoking status: Never Smoker   Smokeless tobacco: Never Used  Scientific laboratory technician Use: Never used  Substance and Sexual Activity   Alcohol use: Yes    Alcohol/week: 2.0 - 3.0 standard drinks    Types: 1 - 2 Glasses of wine, 1 Shots of liquor per week    Comment: couple of times a week   Drug use: No   Sexual activity: Yes    Birth control/protection: Surgical  Other Topics Concern   Not on file  Social History Narrative   ** Merged History Encounter **       Exercise 3-4 times a week.   Married Sallyanne Kuster) and lives with her spouse.   Retired Personal assistant.   Education- college   Patient is right handed.   Patient drinks tea a lot and  water.   Social Determinants of Health   Financial Resource Strain:    Difficulty of Paying Living Expenses:   Food Insecurity:    Worried About Charity fundraiser in the Last Year:    Arboriculturist in the Last Year:   Transportation Needs:    Film/video editor (Medical):    Lack of Transportation (Non-Medical):   Physical Activity:    Days of Exercise per Week:    Minutes of Exercise per Session:   Stress:    Feeling of Stress :   Social Connections:    Frequency of Communication with Friends and Family:    Frequency of Social Gatherings with Friends and Family:    Attends Religious Services:    Active Member of Clubs or Organizations:    Attends Archivist Meetings:    Marital Status:   Intimate Partner Violence:    Fear of Current or Ex-Partner:    Emotionally Abused:    Physically Abused:    Sexually Abused:       PHYSICAL EXAM  Vitals:   02/09/20 1130  BP: 127/70  Pulse: 81  Weight: 188 lb (85.3 kg)   Body mass index is 33.3 kg/m.  Generalized: Well developed, in no acute distress  Cardiology: normal rate and rhythm, no murmur noted Respiratory: clear to auscultation bilaterally  Neurological examination  Mentation: Alert oriented to time, place, history taking. Follows all commands speech and language fluent Cranial nerve II-XII: Pupils were equal round reactive to light. Extraocular movements were full, visual field were full  Motor: The motor testing reveals 5 over 5 strength of all 4 extremities. Good symmetric motor tone is noted throughout.  Gait and station: Gait is normal.   DIAGNOSTIC DATA (LABS, IMAGING, TESTING) - I reviewed patient records, labs, notes, testing and imaging myself where available.  No flowsheet data found.   Lab Results  Component Value Date   WBC 6.1 10/18/2019   HGB 14.4 10/18/2019   HCT 42.0 10/18/2019   MCV 93 10/18/2019   PLT 226 10/18/2019      Component Value Date/Time   NA  142 10/18/2019 1228   K 5.3 (H) 10/18/2019 1228   CL 105 10/18/2019 1228   CO2 18 (L) 10/18/2019 1228   GLUCOSE 83 10/18/2019 1228   GLUCOSE 135 (H) 06/09/2018 0456   BUN 19 10/18/2019 1228   CREATININE 0.95 10/18/2019 1228   CALCIUM 10.9 (H) 10/18/2019 1228   PROT 7.5 10/18/2019 1228  ALBUMIN 5.0 (H) 10/18/2019 1228   AST 23 10/18/2019 1228   ALT 25 10/18/2019 1228   ALKPHOS 64 10/18/2019 1228   BILITOT 0.3 10/18/2019 1228   GFRNONAA 60 10/18/2019 1228   GFRAA 70 10/18/2019 1228   Lab Results  Component Value Date   CHOL 176 08/25/2017   HDL 84 08/25/2017   LDLCALC 74 08/25/2017   TRIG 91 08/25/2017   CHOLHDL 3 08/24/2010   Lab Results  Component Value Date   HGBA1C 5.0 10/18/2019   Lab Results  Component Value Date   GGYIRSWN46 270 05/03/2011   Lab Results  Component Value Date   TSH 1.900 08/25/2017       ASSESSMENT AND PLAN 74 y.o. year old female  has a past medical history of Allergic rhinitis, Anxiety, Arthritis, Asthma, Back pain, Blood transfusion without reported diagnosis, Colon polyps, Constipation, Diverticulosis, Dysrhythmia (1999), Elevated LFTs, Fatty liver disease, nonalcoholic, GERD (gastroesophageal reflux disease), Headache(784.0), Heart murmur, Hypertension, IBS (irritable bowel syndrome), Joint pain, Migraines, Muscle cramps, Neuromuscular disorder ([place]), Obesity, Palpitations, Pneumonia (2007), Renal cell carcinoma ([place]) (2013), Restless leg syndrome, Sleep apnea, TIA (transient ischemic attack) (09/11/2014), Ulcer, Urinary incontinence, and Vitamin D deficiency. here with     ICD-10-CM   1. Obstructive sleep apnea on CPAP  G47.33    Z99.89   2. Chronic migraine  G43.709     [name] reports that she is using her old CPAP machine consistently.  Unfortunately, I do not have a compliance report to review today.  We have reached out to her DME company, [employer], who has requested that the patient call with the serial number of her old machine.  They  will fax an updated compliance report to [name] once this information is received.  I will update plan as needed and review of compliance report.  She was encouraged to continue using CPAP nightly and for greater than 4 hours each night.  I have encouraged her to discuss any concerns with her new CPAP machine with [facility].  She will continue Nurtec as needed for abortive therapy.  Adequate hydration and healthy lifestyle habits encouraged.  She will follow-up with me in 1 year, sooner if needed.  She verbalizes understanding and agreement with this plan.   ADDENDUM 02/10/2020: I received a compliance report from [facility] dating 11/12/2019 through 02/09/2020.  She has used CPAP therapy every night and greater than 4 hours every night.  Set pressure of 12 cm water.  Unfortunately, there is no mention of residual AHI on this report.  [facility] has notified me that she will be using her new machine from now I will repeat download in 2 weeks to assess AHI on new machine.  Addendum 03/28/2020: I have reviewed most recent download dated 02/16/2020 through 03/16/2020.  She used CPAP 97% of the time.  Residual AHI was 1.4 on 12 cm of water pressure.  There was no significant leak.  No orders of the defined types were placed in this encounter.    No orders of the defined types were placed in this encounter.     I spent 15 minutes with the patient. 50% of this time was spent counseling and educating patient on plan of care and medications.    Debbora Presto, FNP-C 02/09/2020, 4:10 PM [facility] [address], [place], [place] [address] [phone]

## 2020-02-21 DIAGNOSIS — L659 Nonscarring hair loss, unspecified: Secondary | ICD-10-CM | POA: Diagnosis not present

## 2020-02-21 DIAGNOSIS — D1801 Hemangioma of skin and subcutaneous tissue: Secondary | ICD-10-CM | POA: Diagnosis not present

## 2020-02-21 DIAGNOSIS — L821 Other seborrheic keratosis: Secondary | ICD-10-CM | POA: Diagnosis not present

## 2020-02-21 DIAGNOSIS — L718 Other rosacea: Secondary | ICD-10-CM | POA: Diagnosis not present

## 2020-02-29 DIAGNOSIS — L659 Nonscarring hair loss, unspecified: Secondary | ICD-10-CM | POA: Diagnosis not present

## 2020-02-29 DIAGNOSIS — L650 Telogen effluvium: Secondary | ICD-10-CM | POA: Diagnosis not present

## 2020-02-29 DIAGNOSIS — L649 Androgenic alopecia, unspecified: Secondary | ICD-10-CM | POA: Diagnosis not present

## 2020-03-03 DIAGNOSIS — H02403 Unspecified ptosis of bilateral eyelids: Secondary | ICD-10-CM | POA: Diagnosis not present

## 2020-03-28 ENCOUNTER — Telehealth: Payer: Self-pay | Admitting: Family Medicine

## 2020-03-28 NOTE — Telephone Encounter (Signed)
I called [facility], spoke to [place] and she added [name] to pts profile for airview.  Was able to view and if needed to download her information.

## 2020-03-28 NOTE — Telephone Encounter (Signed)
Spoke to [place] about the cpap download requested,  In airview what I have is only to 02-09-20.  ? New machine.  He will fax this report to me at [phone].

## 2020-03-28 NOTE — Telephone Encounter (Signed)
[facility] ([place]) called, download for CPAP showing number given as busy. Could the nurse for NP call back to confirm correct number to download for CPAP to see how its working.

## 2020-04-13 DIAGNOSIS — H02834 Dermatochalasis of left upper eyelid: Secondary | ICD-10-CM | POA: Diagnosis not present

## 2020-04-13 DIAGNOSIS — H0279 Other degenerative disorders of eyelid and periocular area: Secondary | ICD-10-CM | POA: Diagnosis not present

## 2020-04-13 DIAGNOSIS — H02413 Mechanical ptosis of bilateral eyelids: Secondary | ICD-10-CM | POA: Diagnosis not present

## 2020-04-13 DIAGNOSIS — H02831 Dermatochalasis of right upper eyelid: Secondary | ICD-10-CM | POA: Diagnosis not present

## 2020-04-13 DIAGNOSIS — H57813 Brow ptosis, bilateral: Secondary | ICD-10-CM | POA: Diagnosis not present

## 2020-04-13 DIAGNOSIS — H53483 Generalized contraction of visual field, bilateral: Secondary | ICD-10-CM | POA: Diagnosis not present

## 2020-04-19 DIAGNOSIS — H53481 Generalized contraction of visual field, right eye: Secondary | ICD-10-CM | POA: Diagnosis not present

## 2020-04-19 DIAGNOSIS — H53482 Generalized contraction of visual field, left eye: Secondary | ICD-10-CM | POA: Diagnosis not present

## 2020-04-19 DIAGNOSIS — H53483 Generalized contraction of visual field, bilateral: Secondary | ICD-10-CM | POA: Diagnosis not present

## 2020-05-17 DIAGNOSIS — R749 Abnormal serum enzyme level, unspecified: Secondary | ICD-10-CM | POA: Diagnosis not present

## 2020-05-17 DIAGNOSIS — I10 Essential (primary) hypertension: Secondary | ICD-10-CM | POA: Diagnosis not present

## 2020-05-17 DIAGNOSIS — R7303 Prediabetes: Secondary | ICD-10-CM | POA: Diagnosis not present

## 2020-06-07 DIAGNOSIS — L723 Sebaceous cyst: Secondary | ICD-10-CM | POA: Diagnosis not present

## 2020-06-07 DIAGNOSIS — B350 Tinea barbae and tinea capitis: Secondary | ICD-10-CM | POA: Diagnosis not present

## 2020-06-07 DIAGNOSIS — L718 Other rosacea: Secondary | ICD-10-CM | POA: Diagnosis not present

## 2020-06-22 ENCOUNTER — Ambulatory Visit (HOSPITAL_COMMUNITY)
Admission: RE | Admit: 2020-06-22 | Discharge: 2020-06-22 | Disposition: A | Discharge status: Home / Self Care | Payer: Medicare Other | Source: Ambulatory Visit | Attending: Internal Medicine | Admitting: Internal Medicine

## 2020-06-22 ENCOUNTER — Other Ambulatory Visit: Payer: Self-pay

## 2020-06-22 ENCOUNTER — Other Ambulatory Visit (HOSPITAL_COMMUNITY): Payer: Self-pay | Admitting: Internal Medicine

## 2020-06-22 DIAGNOSIS — R52 Pain, unspecified: Secondary | ICD-10-CM | POA: Diagnosis not present

## 2020-06-22 DIAGNOSIS — M79604 Pain in right leg: Secondary | ICD-10-CM | POA: Diagnosis not present

## 2020-06-22 DIAGNOSIS — R252 Cramp and spasm: Secondary | ICD-10-CM | POA: Diagnosis not present

## 2020-06-22 DIAGNOSIS — M79605 Pain in left leg: Secondary | ICD-10-CM | POA: Diagnosis not present

## 2020-08-10 ENCOUNTER — Ambulatory Visit: Payer: Self-pay

## 2020-08-10 ENCOUNTER — Encounter: Payer: Self-pay | Admitting: Orthopaedic Surgery

## 2020-08-10 ENCOUNTER — Ambulatory Visit (INDEPENDENT_AMBULATORY_CARE_PROVIDER_SITE_OTHER): Payer: Medicare Other

## 2020-08-10 ENCOUNTER — Ambulatory Visit (INDEPENDENT_AMBULATORY_CARE_PROVIDER_SITE_OTHER): Payer: Medicare Other | Admitting: Orthopaedic Surgery

## 2020-08-10 ENCOUNTER — Other Ambulatory Visit: Payer: Self-pay

## 2020-08-10 VITALS — Ht 63.0 in | Wt 188.0 lb

## 2020-08-10 DIAGNOSIS — M79605 Pain in left leg: Secondary | ICD-10-CM

## 2020-08-10 DIAGNOSIS — M79604 Pain in right leg: Secondary | ICD-10-CM

## 2020-08-10 DIAGNOSIS — M4316 Spondylolisthesis, lumbar region: Secondary | ICD-10-CM

## 2020-08-10 NOTE — Progress Notes (Signed)
Office Visit Note   Patient: Jamie [name]           Date of Birth: 02/03/1946           MRN: [id number] Visit Date: 08/10/2020              Requested by: Deland Pretty, MD [address] [place],  [place] [address] PCP: Deland Pretty, MD   Assessment & Plan: Visit Diagnoses:  1. Bilateral leg pain   2. Spondylolisthesis of lumbar region     Plan: Chronic bilateral leg pain of unknown etiology.  Has been diagnosed with restless leg syndrome in the past and has tried Ecologist.  Does not have a's specific time of the day or activity that creates the pain.  No significant back pain.  Does have some buttock discomfort.  The discomfort in her legs is pain without numbness or tingling in the seems somewhat diffuse rather than localized.  She has been followed by her family physician and notes she has had lab work which she notes was normal.  Not sure I have a specific diagnosis or that I will be able to help but I think it is worth obtaining an MRI scan as a possible cause of her leg pain.  Discussed at length and answered any questions.  Also could consider EMGs and nerve conduction studies with the possibility of diabetic neuropathy  Follow-Up Instructions: Return After MRI scan lumbar spine.   Orders:  Orders Placed This Encounter  Procedures  . XR Lumbar Spine 2-3 Views  . MR Lumbar Spine w/o contrast   No orders of the defined types were placed in this encounter.     Procedures: No procedures performed   Clinical Data: No additional findings.   Subjective: Chief Complaint  Patient presents with  . Right Leg - Pain  . Left Leg - Pain  Patient presents today for bilateral leg pain. She said that she woke with cramps in her legs in December. She had a difficult time getting her cramps to resolve. She said that both legs were very sore afterwards. Since the cramps, she states that her legs ache. She has a history of restless leg syndrome. No lower  back or groin pain.  Has been diagnosed with restless leg syndrome in the past by her primary care physician is tried Klonopin and Skelaxin which did provide some help.  She is not had any injury or trauma.  Does not really have any numbness or tingling just pain.  There is no particular pattern to her pain and she can experience it during the day or even at night.  Has history of diabetes.  Does not have fibromyalgia.  Not on a statin drug.  HPI  Review of Systems   Objective: Vital Signs: Ht 5' 3"  (1.6 m)   Wt 188 lb (85.3 kg)   BMI 33.30 kg/m   Physical Exam Constitutional:      Appearance: She is well-developed and well-nourished.  HENT:     Mouth/Throat:     Mouth: Oropharynx is clear and moist.  Eyes:     Extraocular Movements: EOM normal.     Pupils: Pupils are equal, round, and reactive to light.  Pulmonary:     Effort: Pulmonary effort is normal.  Skin:    General: Skin is warm and dry.  Neurological:     Mental Status: She is alert and oriented to person, place, and time.  Psychiatric:  Mood and Affect: Mood and affect normal.        Behavior: Behavior normal.     Ortho Exam awake alert and oriented x3 comfortable sitting.  Straight leg raise negative bilaterally.  No edema of either lower extremity.  No obvious neuropathy as there is no burning or tingling in either lower extremity.  +1 pulses.  Painless range of motion both hips.  No percussible tenderness of lumbar spine or either SI joint.  No significant trigger point areas of tenderness Specialty Comments:  No specialty comments available.  Imaging: XR Lumbar Spine 2-3 Views  Result Date: 08/10/2020 Films of the lumbar spine were obtained in 2 projections and included limited views of the hips.  The hip joints appear to be intact.  Lots of bowel gas which obliterates some of the detail.  No obvious bony lesions or fractures.  There is an anterior listhesis of about grade 2 of L4 on 5 and a lateral film  and facet changes of sclerosis and degeneration at L4-5 and L5-S1.  The disc spaces appear to be well-maintained    PMFS History: Patient Active Problem List   Diagnosis Date Noted  . Bilateral leg pain 08/10/2020  . Acute appendicitis 06/08/2018  . Hyponatremia 11/06/2017  . Other fatigue 08/25/2017  . Shortness of breath on exertion 08/25/2017  . Vitamin D deficiency 08/25/2017  . Hyperglycemia 08/25/2017  . 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 TIA (transient ischemic attack) 12/19/2016  . Common migraine 12/19/2016  . Obstructive sleep apnea on CPAP 12/19/2016  . Acute urinary retention 11/21/2016  . 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 Diverticulitis 11/20/2016  . Pain in left ankle and joints of left foot 08/13/2016  . Pain and swelling of left ankle 08/13/2016  . At risk for polypharmacy 10/02/2015  . Intractable migraine with aura with status migrainosus 10/02/2015  . Other specified transient cerebral ischemias 09/16/2014  . 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 Morbid obesity ([place]) 09/16/2014  . Headache 05/03/2013  . Preop exam for internal medicine 03/10/2012  . Kidney mass 03/10/2012  . UTI (lower urinary tract infection) 02/13/2012  . Excessive tearing 12/16/2011  . Leg pain 09/20/2011  . Acute bronchitis 06/27/2011  . Wheezing 06/27/2011  . Rosacea 05/07/2011  . Shoulder pain, right 05/07/2011  . OSA (obstructive sleep apnea) 01/31/2011  . Confusion state 11/26/2010  . ABNORMAL ELECTROCARDIOGRAM 08/28/2010  . Tinnitus 08/09/2010  . SINUSITIS, CHRONIC 08/09/2010  . Dizziness and giddiness 01/18/2010  . HYPERGLYCEMIA 08/23/2009  . LIVER FUNCTION TESTS, ABNORMAL, HX OF 08/23/2009  . Fatigue 01/16/2009  . PERSONAL HX COLONIC POLYPS 09/21/2008  . ADENOMATOUS COLONIC POLYP 09/20/2008  . ALLERGIC RHINITIS 04/28/2008  . RESTRICTIVE LUNG DISEASE 04/28/2008  . CRAMPS,LEG 09/28/2007  .  OBESITY 04/01/2007  . VOCAL CORD DISORDER 04/01/2007  . REDUCTION MAMMOPLASTY, HX OF 04/01/2007  . RESTLESS LEG SYNDROME 02/02/2007  . Essential hypertension 02/02/2007  . MITRAL VALVE PROLAPSE 02/02/2007  . GERD 02/02/2007  . Irritable bowel syndrome 02/02/2007  . BREAST CYST 02/02/2007   Past Medical History:  Diagnosis Date  . Allergic rhinitis   . Anxiety   . Arthritis    left foot  . Asthma   . Back pain   . Blood transfusion without reported diagnosis   . Colon polyps    adenomatous  . Constipation   . Diverticulosis   . Dysrhythmia 1999   Dr Johnsie Cancel 2003  . Elevated LFTs   .  Fatty liver disease, nonalcoholic   . GERD (gastroesophageal reflux disease)   . Headache(784.0)    migraines with vision changes but no pain  . Heart murmur   . Hypertension   . IBS (irritable bowel syndrome)   . Joint pain   . Migraines   . Muscle cramps   . Neuromuscular disorder (HCC)    RLS  . Obesity   . Palpitations   . Pneumonia 2007  . Renal cell carcinoma ([place]) 2013   minimal partial nephrectomy  . Restless leg syndrome   . Sleep apnea    moderate; wears CPAP, uncertain about setting  . TIA (transient ischemic attack) 09/11/2014  . Ulcer   . Urinary incontinence   . Vitamin D deficiency     Family History  Problem Relation Age of Onset  . Heart disease Father 33  . Lung cancer Father        smoker  . Hypertension Mother   . Anxiety disorder Mother   . Heart disease Brother   . Hypertension Other   . Colon cancer Cousin   . Allergies Daughter   . Allergies Brother   . Melanoma Daughter   . Lung cancer Sister   . Liver disease Neg Hx   . Asthma Neg Hx   . Esophageal cancer Neg Hx   . Rectal cancer Neg Hx   . Stomach cancer Neg Hx   . Liver cancer Neg Hx     Past Surgical History:  Procedure Laterality Date  . BREAST REDUCTION SURGERY  Jan 1991  . ELBOW SURGERY Left   . KIDNEY SURGERY Right October 2013    Tumor removed   . LAPAROSCOPIC APPENDECTOMY N/A  06/08/2018   Procedure: APPENDECTOMY LAPAROSCOPIC;  Surgeon: Jovita Kussmaul, MD;  Location: [facility];  Service: General;  Laterality: N/A;  . TONSILLECTOMY    . VAGINAL HYSTERECTOMY  1979   partial   Social History   Occupational History  . 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 Smoking status: Never Smoker  . Smokeless tobacco: Never Used  Vaping Use  . Vaping Use: Never used  Substance and Sexual Activity  . Alcohol use: Yes    Alcohol/week: 2.0 - 3.0 standard drinks    Types: 1 - 2 Glasses of wine, 1 Shots of liquor per week    Comment: couple of times a week  . Drug use: No  . Sexual activity: Yes    Birth control/protection: Surgical

## 2020-08-11 DIAGNOSIS — H1013 Acute atopic conjunctivitis, bilateral: Secondary | ICD-10-CM | POA: Diagnosis not present

## 2020-08-24 ENCOUNTER — Telehealth: Payer: Self-pay | Admitting: Internal Medicine

## 2020-08-24 ENCOUNTER — Other Ambulatory Visit: Payer: Self-pay

## 2020-08-24 MED ORDER — AMOXICILLIN-POT CLAVULANATE 875-125 MG PO TABS
1.0000 | ORAL_TABLET | Freq: Two times a day (BID) | ORAL | 0 refills | Status: DC
Start: 2020-08-24 — End: 2020-09-08

## 2020-08-24 NOTE — Telephone Encounter (Signed)
Given that, prescribe Augmentin 875 mg twice daily for 1 week.  Thanks

## 2020-08-24 NOTE — Telephone Encounter (Signed)
Spoke with pts husband and he is aware. Script sent to pharmacy, pt scheduled to see Alonza Bogus PA 09/08/20@10 :30am.

## 2020-08-24 NOTE — Telephone Encounter (Signed)
Pt is requesting a call back from a nurse to discuss her diverticulitis flare up.

## 2020-08-24 NOTE — Telephone Encounter (Signed)
Pt states she has had diverticulitis in the past and she thinks she is having a flare. States it started yesterday with abd cramping and spasms. Reports today she is having pain in her LLQ and is having some nausea, states she has not been able to eat much. She does not think she has a temp but has not taken her temperature. Please advise.

## 2020-08-24 NOTE — Telephone Encounter (Signed)
Entered order for cipro and got an alert pt is allergic to Moxifloxacin, has palpitations and hives. Please advise.

## 2020-08-24 NOTE — Telephone Encounter (Signed)
1.  Low residue diet 2.  Tylenol for pain 3.  If no allergy, prescribe Cipro 500 mg twice daily for 1 week 4.  Office follow-up with advanced practitioner in 7 to 10 days.

## 2020-08-30 ENCOUNTER — Other Ambulatory Visit: Payer: Self-pay | Admitting: Internal Medicine

## 2020-08-30 ENCOUNTER — Ambulatory Visit
Admission: RE | Admit: 2020-08-30 | Discharge: 2020-08-30 | Disposition: A | Discharge status: Home / Self Care | Payer: Medicare Other | Source: Ambulatory Visit | Attending: Orthopaedic Surgery | Admitting: Orthopaedic Surgery

## 2020-08-30 ENCOUNTER — Other Ambulatory Visit: Payer: Self-pay

## 2020-08-30 DIAGNOSIS — M4316 Spondylolisthesis, lumbar region: Secondary | ICD-10-CM

## 2020-08-30 DIAGNOSIS — M545 Low back pain, unspecified: Secondary | ICD-10-CM | POA: Diagnosis not present

## 2020-08-30 DIAGNOSIS — M48061 Spinal stenosis, lumbar region without neurogenic claudication: Secondary | ICD-10-CM | POA: Diagnosis not present

## 2020-09-07 ENCOUNTER — Other Ambulatory Visit: Payer: Self-pay

## 2020-09-07 ENCOUNTER — Encounter: Payer: Self-pay | Admitting: Orthopaedic Surgery

## 2020-09-07 ENCOUNTER — Ambulatory Visit (INDEPENDENT_AMBULATORY_CARE_PROVIDER_SITE_OTHER): Payer: Medicare Other | Admitting: Orthopaedic Surgery

## 2020-09-07 DIAGNOSIS — M25552 Pain in left hip: Secondary | ICD-10-CM

## 2020-09-07 MED ORDER — LIDOCAINE HCL 1 % IJ SOLN
2.0000 mL | INTRAMUSCULAR | Status: AC | PRN
  Administered 2020-09-07: 2 mL

## 2020-09-07 MED ORDER — BUPIVACAINE HCL 0.25 % IJ SOLN
2.0000 mL | INTRAMUSCULAR | Status: AC | PRN
  Administered 2020-09-07: 2 mL via INTRA_ARTICULAR

## 2020-09-07 NOTE — Progress Notes (Signed)
Office Visit Note   Patient: Jamie [name]           Date of Birth: [birth year]           MRN: [id number] Visit Date: 09/07/2020              Requested by: Deland Pretty, MD [address] [place],  [place] [address] PCP: Deland Pretty, MD   Assessment & Plan: Visit Diagnoses:  1. Pain in left hip     Plan: Mrs. Browder had an MRI scan of her lumbar spine and attempt to elucidate the problem she is having with both lower extremities.  She does have a history of restless leg syndrome.  MRI scan revealed multilevel degenerative arthritis with facet changes particularly at L4-5 where she even had mild spinal stenosis.  She is having a little bit more trouble on the left than she is on the right and even some pain over the greater trochanter.  I think she may be experiencing some symptoms of spinal claudication from the spinal stenosis but she would like to try cortisone injection over the greater trochanter she was tender in that area.  We will monitor her response over the next month and then consider epidural steroid injection  Follow-Up Instructions: Return in about 1 month (around 10/08/2020).   Orders:  Orders Placed This Encounter  Procedures  . Large Joint Inj: L greater trochanter   No orders of the defined types were placed in this encounter.     Procedures: Large Joint Inj: L greater trochanter on 09/07/2020 2:46 PM Indications: pain and diagnostic evaluation Details: 25 G 1.5 in needle, lateral approach  Arthrogram: No  Medications: 2 mL lidocaine 1 %; 2 mL bupivacaine 0.25 %  12 mg betamethasone injected over the greater trochanter left hip with Xylocaine and Marcaine Procedure, treatment alternatives, risks and benefits explained, specific risks discussed. Consent was given by the patient. Immediately prior to procedure a time out was called to verify the correct patient, procedure, equipment, support staff and site/side marked as required. Patient was  prepped and draped in the usual sterile fashion.       Clinical Data: No additional findings.   Subjective: Chief Complaint  Patient presents with  . Lower Back - Follow-up    MRI review  Patient presents today for follow up on her lower back. She had an MRI done and is here today to discuss those results.  HPI  Review of Systems   Objective: Vital Signs: Ht 5' 3"  (1.6 m)   Wt 188 lb (85.3 kg)   BMI 33.30 kg/m   Physical Exam Constitutional:      Appearance: She is well-developed and well-nourished.  HENT:     Mouth/Throat:     Mouth: Oropharynx is clear and moist.  Eyes:     Extraocular Movements: EOM normal.     Pupils: Pupils are equal, round, and reactive to light.  Pulmonary:     Effort: Pulmonary effort is normal.  Skin:    General: Skin is warm and dry.  Neurological:     Mental Status: She is alert and oriented to person, place, and time.  Psychiatric:        Mood and Affect: Mood and affect normal.        Behavior: Behavior normal.     Ortho Exam straight leg raise negative.  Does have some tenderness directly over the greater trochanter left hip.  No crepitation.  Skin intact.  Painless range of motion both hips without groin discomfort.  No pain in the lumbar spine.  Specialty Comments:  No specialty comments available.  Imaging: No results found.   PMFS History: Patient Active Problem List   Diagnosis Date Noted  . Pain in left hip 09/07/2020  . Bilateral leg pain 08/10/2020  . Acute appendicitis 06/08/2018  . Hyponatremia 11/06/2017  . Other fatigue 08/25/2017  . Shortness of breath on exertion 08/25/2017  . Vitamin D deficiency 08/25/2017  . Hyperglycemia 08/25/2017  . 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 TIA (transient ischemic attack) 12/19/2016  . Common migraine 12/19/2016  . Obstructive sleep apnea on CPAP 12/19/2016  . Acute urinary retention 11/21/2016  . Nonalcoholic fatty liver disease  without nonalcoholic steatohepatitis (NASH) 11/21/2016  . Diverticulitis 11/20/2016  . Pain in left ankle and joints of left foot 08/13/2016  . Pain and swelling of left ankle 08/13/2016  . At risk for polypharmacy 10/02/2015  . Intractable migraine with aura with status migrainosus 10/02/2015  . Other specified transient cerebral ischemias 09/16/2014  . 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 Morbid obesity ([place]) 09/16/2014  . Headache 05/03/2013  . Preop exam for internal medicine 03/10/2012  . Kidney mass 03/10/2012  . UTI (lower urinary tract infection) 02/13/2012  . Excessive tearing 12/16/2011  . Leg pain 09/20/2011  . Acute bronchitis 06/27/2011  . Wheezing 06/27/2011  . Rosacea 05/07/2011  . Shoulder pain, right 05/07/2011  . OSA (obstructive sleep apnea) 01/31/2011  . Confusion state 11/26/2010  . ABNORMAL ELECTROCARDIOGRAM 08/28/2010  . Tinnitus 08/09/2010  . SINUSITIS, CHRONIC 08/09/2010  . Dizziness and giddiness 01/18/2010  . HYPERGLYCEMIA 08/23/2009  . LIVER FUNCTION TESTS, ABNORMAL, HX OF 08/23/2009  . Fatigue 01/16/2009  . PERSONAL HX COLONIC POLYPS 09/21/2008  . ADENOMATOUS COLONIC POLYP 09/20/2008  . ALLERGIC RHINITIS 04/28/2008  . RESTRICTIVE LUNG DISEASE 04/28/2008  . CRAMPS,LEG 09/28/2007  . OBESITY 04/01/2007  . VOCAL CORD DISORDER 04/01/2007  . REDUCTION MAMMOPLASTY, HX OF 04/01/2007  . RESTLESS LEG SYNDROME 02/02/2007  . Essential hypertension 02/02/2007  . MITRAL VALVE PROLAPSE 02/02/2007  . GERD 02/02/2007  . Irritable bowel syndrome 02/02/2007  . BREAST CYST 02/02/2007   Past Medical History:  Diagnosis Date  . Allergic rhinitis   . Anxiety   . Arthritis    left foot  . Asthma   . Back pain   . Blood transfusion without reported diagnosis   . Colon polyps    adenomatous  . Constipation   . Diverticulosis   . Dysrhythmia 1999   Dr Johnsie Cancel 2003  . Elevated LFTs   . Fatty liver disease, nonalcoholic   . GERD (gastroesophageal reflux disease)    . Headache(784.0)    migraines with vision changes but no pain  . Heart murmur   . Hypertension   . IBS (irritable bowel syndrome)   . Joint pain   . Migraines   . Muscle cramps   . Neuromuscular disorder (HCC)    RLS  . Obesity   . Palpitations   . Pneumonia 2007  . Renal cell carcinoma ([place]) 2013   minimal partial nephrectomy  . Restless leg syndrome   . Sleep apnea    moderate; wears CPAP, uncertain about setting  . TIA (transient ischemic attack) 09/11/2014  . Ulcer   . Urinary incontinence   . Vitamin D deficiency     Family History  Problem Relation Age of Onset  . Heart disease Father 72  . Lung cancer  Father        smoker  . Hypertension Mother   . Anxiety disorder Mother   . Heart disease Brother   . Hypertension Other   . Colon cancer Cousin   . Allergies Daughter   . Allergies Brother   . Melanoma Daughter   . Lung cancer Sister   . Liver disease Neg Hx   . Asthma Neg Hx   . Esophageal cancer Neg Hx   . Rectal cancer Neg Hx   . Stomach cancer Neg Hx   . Liver cancer Neg Hx     Past Surgical History:  Procedure Laterality Date  . BREAST REDUCTION SURGERY  Jan 1991  . ELBOW SURGERY Left   . KIDNEY SURGERY Right October 2013    Tumor removed   . LAPAROSCOPIC APPENDECTOMY N/A 06/08/2018   Procedure: APPENDECTOMY LAPAROSCOPIC;  Surgeon: Jovita Kussmaul, MD;  Location: [facility];  Service: General;  Laterality: N/A;  . TONSILLECTOMY    . VAGINAL HYSTERECTOMY  1979   partial   Social History   Occupational History  . 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 Smoking status: Never Smoker  . Smokeless tobacco: Never Used  Vaping Use  . Vaping Use: Never used  Substance and Sexual Activity  . Alcohol use: Yes    Alcohol/week: 2.0 - 3.0 standard drinks    Types: 1 - 2 Glasses of wine, 1 Shots of liquor per week    Comment: couple of times a week  . Drug use: No  . Sexual activity: Yes    Birth  control/protection: Surgical

## 2020-09-08 ENCOUNTER — Ambulatory Visit (INDEPENDENT_AMBULATORY_CARE_PROVIDER_SITE_OTHER): Payer: Medicare Other | Admitting: Gastroenterology

## 2020-09-08 ENCOUNTER — Encounter: Payer: Self-pay | Admitting: Gastroenterology

## 2020-09-08 VITALS — BP 106/62 | HR 79 | Ht 63.0 in | Wt 187.0 lb

## 2020-09-08 DIAGNOSIS — K5792 Diverticulitis of intestine, part unspecified, without perforation or abscess without bleeding: Secondary | ICD-10-CM | POA: Diagnosis not present

## 2020-09-08 NOTE — Progress Notes (Signed)
Noted  

## 2020-09-08 NOTE — Patient Instructions (Signed)
If you are age 73 or older, your body mass index should be between 23-30. Your Body mass index is 33.13 kg/m. If this is out of the aforementioned range listed, please consider follow up with your Primary Care Provider.  If you are age 45 or younger, your body mass index should be between 19-25. Your Body mass index is 33.13 kg/m. If this is out of the aformentioned range listed, please consider follow up with your Primary Care Provider.   Keep follow up with Dr. Henrene Pastor.

## 2020-09-08 NOTE — Progress Notes (Signed)
09/08/2020 INDY PRESTWOOD [id number] Feb 11, 1946   HISTORY OF PRESENT ILLNESS: This is a pleasant 75 year old female who is a patient of Dr. Blanch Media.  She presents here for follow-up of presumed diverticulitis.  She called here on February 17 saying that she was experiencing left lower quadrant abdominal pain and nausea.  She felt that this was similar to diverticulitis that she had in the remote past.  She was treated with Augmentin 875 mg twice daily for a week.  She has been off of that medication now for about a week and her symptoms have completely resolved.  She feels well from a GI standpoint.  Her bowels are moving well with her daily Linzess.  Her last colonoscopy was in June 2021 at which time she had one polyp that was removed and was a tubular adenoma.  Also had diverticulosis and hemorrhoids.  Past Medical History:  Diagnosis Date  . Allergic rhinitis   . Anxiety   . Arthritis    left foot  . Asthma   . Back pain   . Blood transfusion without reported diagnosis   . Colon polyps    adenomatous  . Constipation   . Diverticulosis   . Dysrhythmia 1999   Dr Johnsie Cancel 2003  . Elevated LFTs   . Fatty liver disease, nonalcoholic   . GERD (gastroesophageal reflux disease)   . Headache(784.0)    migraines with vision changes but no pain  . Heart murmur   . Hypertension   . IBS (irritable bowel syndrome)   . Joint pain   . Migraines   . Muscle cramps   . Neuromuscular disorder (HCC)    RLS  . Obesity   . Palpitations   . Pneumonia 2007  . Renal cell carcinoma ([place]) 2013   minimal partial nephrectomy  . Restless leg syndrome   . Sleep apnea    moderate; wears CPAP, uncertain about setting  . TIA (transient ischemic attack) 09/11/2014  . Ulcer   . Urinary incontinence   . Vitamin D deficiency    Past Surgical History:  Procedure Laterality Date  . BREAST REDUCTION SURGERY  Jan 1991  . ELBOW SURGERY Left   . KIDNEY SURGERY Right October 2013    Tumor removed    . LAPAROSCOPIC APPENDECTOMY N/A 06/08/2018   Procedure: APPENDECTOMY LAPAROSCOPIC;  Surgeon: Jovita Kussmaul, MD;  Location: [facility];  Service: General;  Laterality: N/A;  . TONSILLECTOMY    . VAGINAL HYSTERECTOMY  1979   partial    reports that she has never smoked. She has never used smokeless tobacco. She reports current alcohol use of about 2.0 - 3.0 standard drinks of alcohol per week. She reports that she does not use drugs. family history includes Allergies in her brother and daughter; Anxiety disorder in her mother; Colon cancer in her cousin; Heart disease in her brother; Heart disease (age of onset: 47) in her father; Hypertension in her mother and another family member; Lung cancer in her father and sister; Melanoma in her daughter. Allergies  Allergen Reactions  . Buprenorphine Hcl Rash and Shortness Of Breath  . Morphine And Related Shortness Of Breath and Rash  . Morphine And Related Shortness Of Breath  . Aspirin Other (See Comments)    REACTION: ulcers  Can take 81 mg  . Avelox [Moxifloxacin Hcl In Nacl] Hives  . Diflucan [Fluconazole] Hives    Whelps and rash c skin peeling  . Diflucan [Fluconazole] Hives  . Latex Rash  .  Moxifloxacin Palpitations    Tachycardia       Outpatient Encounter Medications as of 09/08/2020  Medication Sig  . Ascorbic Acid (VITAMIN C) 1000 MG tablet Take 1,000 mg by mouth daily.   Marland Kitchen aspirin EC 81 MG tablet Take 1 tablet (81 mg total) by mouth daily.  Marland Kitchen BIOTIN PO Take 1 capsule by mouth daily.   . Cholecalciferol (VITAMIN D3) 2000 units TABS Take 2,000 Units by mouth daily.   . clidinium-chlordiazePOXIDE (LIBRAX) 5-2.5 MG capsule Take 1 capsule by mouth 2 (two) times daily.  . clonazePAM (KLONOPIN) 1 MG tablet Take 0.5 mg by mouth daily as needed (restless leg syndrome).   . conjugated estrogens (PREMARIN) vaginal cream Place 1 Applicatorful vaginally every other day.   . diphenhydrAMINE (BENADRYL) 25 MG tablet Take 25 mg by mouth daily as  needed for allergies.   Marland Kitchen esomeprazole (NEXIUM) 40 MG capsule TAKE 1 CAPSULE TWICE A DAY BEFORE MEALS  . LINZESS 290 MCG CAPS capsule TAKE 1 CAPSULE DAILY BEFORE BREAKFAST  . metaxalone (SKELAXIN) 800 MG tablet Take 400 mg by mouth daily as needed for muscle spasms.  . metFORMIN (GLUCOPHAGE) 500 MG tablet Take 1 tablet (500 mg total) by mouth daily with breakfast.  . Omega-3-6-9 CAPS Take 1 capsule by mouth every evening.  . Prenatal Vit-Fe Fumarate-FA (PRENATAL MULTIVITAMIN) TABS tablet Take 1 tablet by mouth every evening.   . Probiotic Product (PROBIOTIC-10 PO) Take 1 capsule by mouth 2 (two) times daily.   . Rimegepant Sulfate (NURTEC) 75 MG TBDP Take 75 mg by mouth daily as needed (take for abortive therapy of migraine, no more than 1 tablet in 24 hours or 10 per month).  Marland Kitchen spironolactone (ALDACTONE) 50 MG tablet Take 50 mg by mouth daily.   . [DISCONTINUED] amoxicillin-clavulanate (AUGMENTIN) 875-125 MG tablet Take 1 tablet by mouth 2 (two) times daily.  . [DISCONTINUED] 0.9 %  sodium chloride infusion    No facility-administered encounter medications on file as of 09/08/2020.     REVIEW OF SYSTEMS  : All other systems reviewed and negative except where noted in the History of Present Illness.   PHYSICAL EXAM: BP 106/62   Pulse 79   Ht 5' 3"  (1.6 m)   Wt 187 lb (84.8 kg)   SpO2 97%   BMI 33.13 kg/m  General: Well developed white female in no acute distress Head: Normocephalic and atraumatic Eyes:  Sclerae anicteric, conjunctiva pink. Ears: Normal auditory acuity Lungs: Clear throughout to auscultation; no W/R/R. Heart: Regular rate and rhythm; no M/R/G. Abdomen: Soft, non-distended.  BS present.  Non-tender. Musculoskeletal: Symmetrical with no gross deformities  Skin: No lesions on visible extremities Extremities: No edema  Neurological: Alert oriented x 4, grossly non-focal Psychological:  Alert and cooperative. Normal mood and affect  ASSESSMENT AND  PLAN: *Diverticulitis: Resolved with course of Augmentin for 1 week.  No further GI symptoms.  She will follow up as needed.  She actually has an appointment with Dr. Henrene Pastor later this month for her annual visit.  She desires to keep that appointment.   CC:  Deland Pretty, MD

## 2020-09-26 ENCOUNTER — Ambulatory Visit (INDEPENDENT_AMBULATORY_CARE_PROVIDER_SITE_OTHER): Payer: Medicare Other | Admitting: Internal Medicine

## 2020-09-26 ENCOUNTER — Encounter: Payer: Self-pay | Admitting: Internal Medicine

## 2020-09-26 VITALS — BP 130/70 | HR 88 | Ht 62.25 in | Wt 190.1 lb

## 2020-09-26 DIAGNOSIS — Z86010 Personal history of colonic polyps: Secondary | ICD-10-CM

## 2020-09-26 DIAGNOSIS — K5901 Slow transit constipation: Secondary | ICD-10-CM | POA: Diagnosis not present

## 2020-09-26 DIAGNOSIS — K219 Gastro-esophageal reflux disease without esophagitis: Secondary | ICD-10-CM

## 2020-09-26 DIAGNOSIS — K594 Anal spasm: Secondary | ICD-10-CM | POA: Diagnosis not present

## 2020-09-26 DIAGNOSIS — K760 Fatty (change of) liver, not elsewhere classified: Secondary | ICD-10-CM

## 2020-09-26 DIAGNOSIS — K589 Irritable bowel syndrome without diarrhea: Secondary | ICD-10-CM

## 2020-09-26 DIAGNOSIS — K5792 Diverticulitis of intestine, part unspecified, without perforation or abscess without bleeding: Secondary | ICD-10-CM | POA: Diagnosis not present

## 2020-09-26 MED ORDER — LINACLOTIDE 290 MCG PO CAPS: ORAL_CAPSULE | ORAL | 3 refills | Status: DC

## 2020-09-26 MED ORDER — CILIDINIUM-CHLORDIAZEPOXIDE 2.5-5 MG PO CAPS: 1.0000 | ORAL_CAPSULE | Freq: Two times a day (BID) | ORAL | 3 refills | Status: DC

## 2020-09-26 MED ORDER — ESOMEPRAZOLE MAGNESIUM 40 MG PO CPDR: DELAYED_RELEASE_CAPSULE | ORAL | 3 refills | Status: DC

## 2020-09-26 NOTE — Patient Instructions (Addendum)
We have sent the following medications to your pharmacy for you to pick up at your convenience:  Nexium, Linzess, Librax.  Please follow up in one year

## 2020-09-26 NOTE — Progress Notes (Signed)
HISTORY OF PRESENT ILLNESS:  Jamie [name] is a 75 y.o. female with past medical history as listed below.  She is followed in this office for a history of adenomatous colon polyps, GERD, fatty liver, constipation predominant irritable bowel syndrome, and diverticulitis.  She was seen 2 and half weeks ago after being treated for diverticulitis.  She was improved.  She continues to do well from the perspective.  For her constipation predominant irritable bowel syndrome she continues to take Linzess once daily.  This helps.  For her GERD she is on Nexium twice daily.  This controls symptoms.  She also takes Librax 1 twice daily for irritable bowel type discomfort.  She does describe to me 2 occasions of rectal pain awaken her at night.  Deep seated pressure discomfort.  Lasted about 15 minutes.  Her last complete colonoscopy was performed June 2021.  Follow-up in 5 years recommended.  She has completed her COVID vaccination series  REVIEW OF SYSTEMS:  All non-GI ROS negative as otherwise stated in the HPI except for sinus allergy, arthritis, back pain, sleeping problems, urinary leakage  Past Medical History:  Diagnosis Date  . Allergic rhinitis   . Anxiety   . Arthritis    left foot  . Asthma   . Back pain   . Blood transfusion without reported diagnosis   . Colon polyps    adenomatous  . Constipation   . Diverticulosis   . Dysrhythmia 1999   Dr Johnsie Cancel 2003  . Elevated LFTs   . Fatty liver disease, nonalcoholic   . GERD (gastroesophageal reflux disease)   . Headache(784.0)    migraines with vision changes but no pain  . Heart murmur   . Hypertension   . IBS (irritable bowel syndrome)   . Joint pain   . Migraines   . Muscle cramps   . Neuromuscular disorder (HCC)    RLS  . Obesity   . Palpitations   . Pneumonia 2007  . Renal cell carcinoma ([name]) 2013   minimal partial nephrectomy  . Restless leg syndrome   . Sleep apnea    moderate; wears CPAP, uncertain about setting  .  TIA (transient ischemic attack) 09/11/2014  . Ulcer   . Urinary incontinence   . Vitamin D deficiency     Past Surgical History:  Procedure Laterality Date  . BREAST REDUCTION SURGERY  Jan 1991  . ELBOW SURGERY Left   . KIDNEY SURGERY Right October 2013    Tumor removed   . LAPAROSCOPIC APPENDECTOMY N/A 06/08/2018   Procedure: APPENDECTOMY LAPAROSCOPIC;  Surgeon: Jovita Kussmaul, MD;  Location: [facility];  Service: General;  Laterality: N/A;  . TONSILLECTOMY    . VAGINAL HYSTERECTOMY  1979   partial    Social History Jamie [name]  reports that she has never smoked. She has never used smokeless tobacco. She reports current alcohol use of about 2.0 - 3.0 standard drinks of alcohol per week. She reports that she does not use drugs.  family history includes Allergies in her brother and daughter; Anxiety disorder in her mother; Colon cancer in her cousin; Heart disease in her brother; Heart disease (age of onset: 102) in her father; Hypertension in her mother and another family member; Lung cancer in her father and sister; Melanoma in her daughter.  Allergies  Allergen Reactions  . Buprenorphine Hcl Rash and Shortness Of Breath  . Morphine And Related Shortness Of Breath and Rash  . Morphine And Related Shortness Of  Breath  . Aspirin Other (See Comments)    REACTION: ulcers  Can take 81 mg  . Avelox [Moxifloxacin Hcl In Nacl] Hives  . Diflucan [Fluconazole] Hives    Whelps and rash c skin peeling  . Diflucan [Fluconazole] Hives  . Latex Rash  . Moxifloxacin Palpitations    Tachycardia        PHYSICAL EXAMINATION: Vital signs: BP 130/70 (BP Location: Left Arm, Patient Position: Sitting, Cuff Size: Normal)   Pulse 88   Ht 5' 2.25" (1.581 m) Comment: height measured without shoes  Wt 190 lb 2 oz (86.2 kg)   BMI 34.50 kg/m   Constitutional: generally well-appearing, no acute distress Psychiatric: alert and oriented x3, cooperative Eyes: extraocular movements intact,  anicteric, conjunctiva pink Mouth: Mask Neck: supple no lymphadenopathy Cardiovascular: heart regular rate and rhythm, no murmur Lungs: clear to auscultation bilaterally Abdomen: soft, nontender, nondistended, no obvious ascites, no peritoneal signs, normal bowel sounds, no organomegaly Rectal: Omitted Extremities: no clubbing, cyanosis, or lower extremity edema bilaterally Skin: no lesions on visible extremities Neuro: No focal deficits.  Cranial nerves intact  ASSESSMENT:  1.  Chronic GERD.  Requiring twice daily PPI to control symptoms 2.  Constipation predominant IBS.  On Linzess and Librax 3.  Recent bout of diverticulitis.  Resolved 4.  History of adenomatous colon polyps.  Last colonoscopy 2021.  Surveillance up-to-date 5.  Fatty liver 6.  Obesity 7.  General medical problems 8.  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  Reflux precautions 2.  Exercise weight loss 3.  Refill Nexium 40 mg twice daily. 4.  Refill Linzess 290 mcg daily 5.  Refill Librax twice daily 6.  Surveillance colonoscopy around 2026 7.  Discussed massage with warm washcloth for bouts of proctalgia fugax 8.  Return office follow-up 1 year

## 2020-10-25 DIAGNOSIS — Z1231 Encounter for screening mammogram for malignant neoplasm of breast: Secondary | ICD-10-CM | POA: Diagnosis not present

## 2020-12-01 DIAGNOSIS — I10 Essential (primary) hypertension: Secondary | ICD-10-CM | POA: Diagnosis not present

## 2020-12-01 DIAGNOSIS — Z0000 Encounter for general adult medical examination without abnormal findings: Secondary | ICD-10-CM | POA: Diagnosis not present

## 2020-12-06 DIAGNOSIS — R252 Cramp and spasm: Secondary | ICD-10-CM | POA: Diagnosis not present

## 2020-12-06 DIAGNOSIS — Z01419 Encounter for gynecological examination (general) (routine) without abnormal findings: Secondary | ICD-10-CM | POA: Diagnosis not present

## 2020-12-06 DIAGNOSIS — I10 Essential (primary) hypertension: Secondary | ICD-10-CM | POA: Diagnosis not present

## 2020-12-06 DIAGNOSIS — K219 Gastro-esophageal reflux disease without esophagitis: Secondary | ICD-10-CM | POA: Diagnosis not present

## 2020-12-06 DIAGNOSIS — G4733 Obstructive sleep apnea (adult) (pediatric): Secondary | ICD-10-CM | POA: Diagnosis not present

## 2020-12-06 DIAGNOSIS — G2581 Restless legs syndrome: Secondary | ICD-10-CM | POA: Diagnosis not present

## 2020-12-06 DIAGNOSIS — Z0000 Encounter for general adult medical examination without abnormal findings: Secondary | ICD-10-CM | POA: Diagnosis not present

## 2020-12-06 DIAGNOSIS — R7989 Other specified abnormal findings of blood chemistry: Secondary | ICD-10-CM | POA: Diagnosis not present

## 2020-12-06 DIAGNOSIS — R7303 Prediabetes: Secondary | ICD-10-CM | POA: Diagnosis not present

## 2020-12-11 ENCOUNTER — Other Ambulatory Visit: Payer: Self-pay | Admitting: Internal Medicine

## 2020-12-13 DIAGNOSIS — Z0000 Encounter for general adult medical examination without abnormal findings: Secondary | ICD-10-CM | POA: Diagnosis not present

## 2020-12-13 DIAGNOSIS — R7303 Prediabetes: Secondary | ICD-10-CM | POA: Diagnosis not present

## 2020-12-13 DIAGNOSIS — I10 Essential (primary) hypertension: Secondary | ICD-10-CM | POA: Diagnosis not present

## 2020-12-13 DIAGNOSIS — R7989 Other specified abnormal findings of blood chemistry: Secondary | ICD-10-CM | POA: Diagnosis not present

## 2020-12-13 DIAGNOSIS — R252 Cramp and spasm: Secondary | ICD-10-CM | POA: Diagnosis not present

## 2021-01-11 DIAGNOSIS — J069 Acute upper respiratory infection, unspecified: Secondary | ICD-10-CM | POA: Diagnosis not present

## 2021-01-11 DIAGNOSIS — J8410 Pulmonary fibrosis, unspecified: Secondary | ICD-10-CM | POA: Diagnosis not present

## 2021-01-11 DIAGNOSIS — U071 COVID-19: Secondary | ICD-10-CM | POA: Diagnosis not present

## 2021-01-11 DIAGNOSIS — M419 Scoliosis, unspecified: Secondary | ICD-10-CM | POA: Diagnosis not present

## 2021-01-12 DIAGNOSIS — J029 Acute pharyngitis, unspecified: Secondary | ICD-10-CM | POA: Diagnosis not present

## 2021-01-12 DIAGNOSIS — R051 Acute cough: Secondary | ICD-10-CM | POA: Diagnosis not present

## 2021-01-12 DIAGNOSIS — U071 COVID-19: Secondary | ICD-10-CM | POA: Diagnosis not present

## 2021-02-08 ENCOUNTER — Ambulatory Visit (INDEPENDENT_AMBULATORY_CARE_PROVIDER_SITE_OTHER): Payer: Medicare Other | Admitting: Family Medicine

## 2021-02-08 ENCOUNTER — Encounter: Payer: Self-pay | Admitting: Family Medicine

## 2021-02-08 ENCOUNTER — Other Ambulatory Visit: Payer: Self-pay

## 2021-02-08 VITALS — BP 117/81 | HR 86 | Ht 62.0 in | Wt 190.0 lb

## 2021-02-08 DIAGNOSIS — G4733 Obstructive sleep apnea (adult) (pediatric): Secondary | ICD-10-CM | POA: Diagnosis not present

## 2021-02-08 DIAGNOSIS — Z9989 Dependence on other enabling machines and devices: Secondary | ICD-10-CM | POA: Diagnosis not present

## 2021-02-08 NOTE — Progress Notes (Signed)
PATIENT: Jamie [name] DOB: 01/26/1946  REASON FOR VISIT: follow up HISTORY FROM: patient  Chief Complaint  Patient presents with   Follow-up    Follow Up, yearly follow, no new problems or concerns Room 13, alone in room      HISTORY OF PRESENT ILLNESS: 02/08/2021 ALL: Jamie [name] returns for follow up for OSA on CPAP. She is doing very well. She uses CPAP every night for about 10 hours a night. She denies concerns with machine or supplies. She does hear some whistling from time to time. She uses a small full face mask. She recently took Pavloxid for Covid 19. She recovered well and denies lingering symptoms.      02/09/2020 ALL:  Jamie [name] is a 75 y.o. female here today for follow up for OSA on CPAP and headaches. Nurtec was started in June for abortive therapy.  She reports that headaches are well managed.  Nurtec has worked well.  She continues CPAP therapy.  She did receive her new machine last year, however, she reports that it makes an unusual whistling sound.  She has not used her new CPAP and continues to use her old machine.  Unfortunately, we do not have a compliance report to review for today's visit.  She states that she is using CPAP every night.  She denies any concerns with her old machine with the exception of having to restart CPAP from time to time.  She notes significant benefit of using CPAP therapy.  HISTORY: (copied from my note on 02/09/2019)  Jamie [name] is a 75 y.o. female here today for follow up for OSA on CPAP. She is doing well with compliance. She is concerned that her machine is not working correctly. She reports that when she tries to power up the machine, it may take 2-3 tries.  She is concerned that eventually machine will completely give out.  Last sleep study was in 2012.  She feels that her CPAP is approximately that old.  She is using CPAP nightly.  She reports that she cannot sleep without it.     HISTORY: (copied from [employer]  note on 01/19/2018)   (CD)Is and is here today to be evaluated for a headache, and she reports that she first had migrainous headaches when she resumed birth control pills over 40 years ago. The headaches were very debilitating at the time but there were infrequent through the 1970s. Once she discontinued hormonal contraception her headaches became as infrequent as once a year. In the early 1980s the couple lived in [place] and Jamie [name] recalls that the bright light was a trigger for headaches -there was a strong photophobic component, and she made many trips to the local hospital for Demerol shots. She underwent a self hypnosis treatment and this worked well, controlled the migraine for years. On 9-20 02-2013 the patient woke up with a severe explosive kind of pain that seemed to arise from the right eye. The pain was centered retro-orbital and radiated backwards into the center of the head. The patient tried aspirin and Motrin with no benefit at the time as my colleague Dr. Janann Colonel D.O. did document.  This was different from her typical migraines. She was extremely nauseated but she could not vomit and developed dry heaves. The headache broker out of sound sleep at about about 3 AM. She had no tearing of the eye ( that she could remember),  the nose was not runny and her face was not  flushe.She was asleep with her CPAP.   The patient had an eye exam in 2013 which was reportedly normal at that time and her headaches were evaluated with a brain MRI which was also reviewed and found unremarkable. Not had a number of the severe headache spell since. On 09-12-14 the patient again developed a headache this time of a new quality. She was watching TV at about 11 PM she noticed that the right vision was fluctuating she states that her vision was disturbed by changing in light and dark sensation, that she describedas if it checker board was placed in front of her eyes. By 11.45 , she tried to answer an e mail, couldn't  decipher the message, non-sensical to her. She switched and tried to read aloud, she couldn't , her speech output was not correlated to the text she read. Jibberish, while her normal speech was unaffected. On the left side this time by migraines have always attack the right side of her head. This was unusual as well. The entire episode lasted less than one hour. The emergency room obtained a CT head , non contrast, read as normal and reviewed here in imaging file. Her BP was very high.   She sleeps with a CPAP, and likes it very much, she had nocturia before being diagnosed with OSA.   Her pneumonia related hospitalization in 2007 was the cause for the test by [facility], as her RN had witnessed her sleep apnea. Dr .Elsworth Soho follows her, Dr Constance Holster is her ENT.     Interval history from 03/27/2017CD. Jamie [name] is doing very well she has had one of the severe headaches she used to describe in the past, -onset at about 5:30 after working on the computer. She developed a severe right-sided headache. Afraid this could be another TIA I  have recommended her to go to the emergency room, she was given morphine for the headaches which then caused another reaction and side effects. She did not have any speech arrest with this one. The emergency room did not find anything abnormal, she underwent a CT scan of the brain, which was normal, MRI brain  was normal. I'm also able today to just review a download of her CPAP machine which has been followed by Dr. Shelia Media but not in the last 3 years. She has 100% compliance for days 100% compliance for over 4 hours of daily use, average user time is 9 hours 7 minutes set pressure is 12 cm water no EPR is used residual AHI is 0.8. Excellent result. Her obesity was the same. No weight loss.  She was given a prescrition of Fioricet and ondansetron also known as Zofran. She's not supposed to take the medication before the headache component has started.    UPDATE 03/14/2018CM Jamie [name],  75 year old female returns for follow-up.She has not had further stroke or TIA episodes. She denies any recent falls  She had a couple episodes of blurred vision without headache. She has not had any speech arrest, weakness on one side or the other etc. She continues to use her CPAP every night, however she took that she about 2 weeks ago and we are unable to get a download. She remains on enteric-coated aspirin. She continues to get little exercise due to her chronic vertigo. Her transcranial Dopplers showed minimal PFO not likely to be clinically significant. She has been evaluated at [facility] for her chronic vertigo. Her husband reports several instances since last seen when she fell asleep at  the computer without her CPAP and stopped breathing. Once he got oxygen to her she woke up and was fine.  She returns for reevaluation.   UPDATE 06/14/2018CM Jamie [name], 75 year old female returns for follow-up she has a history of TIA and is currently on aspirin for secondary stroke prevention. Blood pressure in the office today 105/65. She continues to take at home and generally in the 751 systolic range. Patient was seen in the emergency room on 11/07/2016 for headache in the left unilateral region the pain did not radiate no nausea and vomiting. She had some visual blurring and has a history of similar headaches She was given Reglan and Benadryl. She is having maybe 2 headaches not enough for a preventive medication. She says prescription strength Motrin takes the headache away worst part is the nausea for which she has nothing to take. She denies any speech arrest weakness on one side or the other. She continues to be compliant with her CPAP download was obtained today. Greater than 4 hours use 97% or 29 days. Average usage not hours 13 minutes at 12 cm pressure. AHI 0.8 She returns for reevaluation   04/22/17 CD I have the pleasure of seeing Jamie [name] today on 04/22/2017, and a routine follow up for CPAP  compliance. She has done excellently with her CPAP machine which is set at 12 cm water pressure without expiratory pressure relief. Her AHI is 0.7 with an average user time of 9 hours and 2 minutes each night. She had a 97% compliance, power was lost first a night due to [employer]. [employer] [facility]- will ask for a battery back up- Linzie Collin, RPSGT.   UPDATE 7/15/2019CM Jamie [name], 75 year old female returns for follow-up for CPAP compliance.  She had a motor change in her machine.  Compliance data dated 12/20/2017 01/18/2018 shows compliance greater than 4 hours at 100%.  Average usage 9 hours 22 minutes.  Set pressure 12 cm EPR level 1.  no leak or AHI data recorded on the report.  ESS 2 she returns for reevaluation   REVIEW OF SYSTEMS: Out of a complete 14 system review of symptoms, the patient complains only of the following symptoms, headaches and all other reviewed systems are negative.    ALLERGIES: Allergies  Allergen Reactions   Buprenorphine Hcl Rash and Shortness Of Breath   Morphine And Related Shortness Of Breath and Rash   Morphine And Related Shortness Of Breath   Aspirin Other (See Comments)    REACTION: ulcers  Can take 81 mg   Avelox [Moxifloxacin Hcl In Nacl] Hives   Diflucan [Fluconazole] Hives    Whelps and rash c skin peeling   Diflucan [Fluconazole] Hives   Latex Rash   Moxifloxacin Palpitations    Tachycardia     HOME MEDICATIONS: Outpatient Medications Prior to Visit  Medication Sig Dispense Refill   Ascorbic Acid (VITAMIN C) 1000 MG tablet Take 1,000 mg by mouth daily.      aspirin EC 81 MG tablet Take 1 tablet (81 mg total) by mouth daily. 90 tablet 3   BIOTIN PO Take 1 capsule by mouth daily.      Cholecalciferol (VITAMIN D3) 2000 units TABS Take 2,000 Units by mouth daily.      clidinium-chlordiazePOXIDE (LIBRAX) 5-2.5 MG capsule Take 1 capsule by mouth 2 (two) times daily. 180 capsule 3   clonazePAM (KLONOPIN) 1 MG tablet Take 0.5 mg by mouth  daily as needed (restless leg syndrome).      conjugated  estrogens (PREMARIN) vaginal cream Place 1 Applicatorful vaginally every other day.      diphenhydrAMINE (BENADRYL) 25 MG tablet Take 25 mg by mouth daily as needed for allergies.      esomeprazole (NEXIUM) 40 MG capsule TAKE 1 CAPSULE TWICE A DAY BEFORE MEALS 180 capsule 3   linaclotide (LINZESS) 290 MCG CAPS capsule TAKE 1 CAPSULE DAILY BEFORE BREAKFAST 90 capsule 3   metaxalone (SKELAXIN) 800 MG tablet Take 400 mg by mouth daily as needed for muscle spasms.     metFORMIN (GLUCOPHAGE) 500 MG tablet Take 1 tablet (500 mg total) by mouth daily with breakfast. 60 tablet 0   Omega-3-6-9 CAPS Take 1 capsule by mouth every evening.     Prenatal Vit-Fe Fumarate-FA (PRENATAL MULTIVITAMIN) TABS tablet Take 1 tablet by mouth every evening.      Probiotic Product (PROBIOTIC-10 PO) Take 1 capsule by mouth 2 (two) times daily.      Rimegepant Sulfate (NURTEC) 75 MG TBDP Take 75 mg by mouth daily as needed (take for abortive therapy of migraine, no more than 1 tablet in 24 hours or 10 per month). 8 tablet 11   spironolactone (ALDACTONE) 50 MG tablet Take 50 mg by mouth daily.      No facility-administered medications prior to visit.    PAST MEDICAL HISTORY: Past Medical History:  Diagnosis Date   Allergic rhinitis    Anxiety    Arthritis    left foot   Asthma    Back pain    Blood transfusion without reported diagnosis    Colon polyps    adenomatous   Constipation    Diverticulosis    Dysrhythmia 1999   Dr Johnsie Cancel 2003   Elevated LFTs    Fatty liver disease, nonalcoholic    GERD (gastroesophageal reflux disease)    Headache(784.0)    migraines with vision changes but no pain   Heart murmur    Hypertension    IBS (irritable bowel syndrome)    Joint pain    Migraines    Muscle cramps    Neuromuscular disorder (HCC)    RLS   Obesity    Palpitations    Pneumonia 2007   Renal cell carcinoma ([place]) 2013   minimal partial  nephrectomy   Restless leg syndrome    Sleep apnea    moderate; wears CPAP, uncertain about setting   TIA (transient ischemic attack) 09/11/2014   Ulcer    Urinary incontinence    Vitamin D deficiency     PAST SURGICAL HISTORY: Past Surgical History:  Procedure Laterality Date   BREAST REDUCTION SURGERY  Jan 1991   ELBOW SURGERY Left    KIDNEY SURGERY Right October 2013    Tumor removed    LAPAROSCOPIC APPENDECTOMY N/A 06/08/2018   Procedure: APPENDECTOMY LAPAROSCOPIC;  Surgeon: Jovita Kussmaul, MD;  Location: [facility];  Service: General;  Laterality: N/A;   TONSILLECTOMY     VAGINAL HYSTERECTOMY  1979   partial    FAMILY HISTORY: Family History  Problem Relation Age of Onset   Heart disease Father 53   Lung cancer Father        smoker   Hypertension Mother    Anxiety disorder Mother    Heart disease Brother    Hypertension Other    Colon cancer Cousin    Allergies Daughter    Allergies Brother    Melanoma Daughter    Lung cancer Sister    Liver disease Neg Hx  Asthma Neg Hx    Esophageal cancer Neg Hx    Rectal cancer Neg Hx    Stomach cancer Neg Hx    Liver cancer Neg Hx     SOCIAL HISTORY: Social History   Socioeconomic History   Marital status: Married    Spouse name: Sallyanne Kuster   Number of children: 2   Years of education: 15   Highest education level: Not on file  Occupational History   Occupation: Retired    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  Other Topics Concern   Not on file  Social History Narrative   Married Sallyanne Kuster) and lives with her spouse.   Retired Engineer, manufacturing systems.   Education- college   Patient is right handed.   Patient drinks tea a lot and water.   Social Determinants of Health   Financial Resource Strain: Not on file  Food Insecurity: Not on file  Transportation Needs: Not on file  Physical Activity: Not on file  Stress: Not on file  Social Connections: Not on file  Intimate Partner Violence: Not on file      PHYSICAL EXAM  Vitals:   02/08/21 1049  BP: 117/81  Pulse: 86  Weight: 190 lb (86.2 kg)  Height: 5' 2"  (1.575 m)    Body mass index is 34.75 kg/m.  Generalized: Well developed, in no acute distress  Cardiology: normal rate and rhythm, no murmur noted Respiratory: clear to auscultation bilaterally  Neurological examination  Mentation: Alert oriented to time, place, history taking. Follows all commands speech and language fluent Cranial nerve II-XII: Pupils were equal round reactive to light. Extraocular movements were full, visual field were full  Motor: The motor testing reveals 5 over 5 strength of all 4 extremities. Good symmetric motor tone is noted throughout.  Gait and station: Gait is normal.   DIAGNOSTIC DATA (LABS, IMAGING, TESTING) - I reviewed patient records, labs, notes, testing and imaging myself where available.  No flowsheet data found.   Lab Results  Component Value Date   WBC 6.1 10/18/2019   HGB 14.4 10/18/2019   HCT 42.0 10/18/2019   MCV 93 10/18/2019   PLT 226 10/18/2019      Component Value Date/Time   NA 142 10/18/2019 1228   K 5.3 (H) 10/18/2019 1228   CL 105 10/18/2019 1228   CO2 18 (L) 10/18/2019 1228   GLUCOSE 83 10/18/2019 1228   GLUCOSE 135 (H) 06/09/2018 0456   BUN 19 10/18/2019 1228   CREATININE 0.95 10/18/2019 1228   CALCIUM 10.9 (H) 10/18/2019 1228   PROT 7.5 10/18/2019 1228   ALBUMIN 5.0 (H) 10/18/2019 1228   AST 23 10/18/2019 1228   ALT 25 10/18/2019 1228   ALKPHOS 64 10/18/2019 1228   BILITOT 0.3 10/18/2019 1228   GFRNONAA 60 10/18/2019 1228   GFRAA 70  10/18/2019 1228   Lab Results  Component Value Date   CHOL 176 08/25/2017   HDL 84 08/25/2017   LDLCALC 74 08/25/2017  TRIG 91 08/25/2017   CHOLHDL 3 08/24/2010   Lab Results  Component Value Date   HGBA1C 5.0 10/18/2019   Lab Results  Component Value Date   VITAMINB12 647 05/03/2011   Lab Results  Component Value Date   TSH 1.900 08/25/2017       ASSESSMENT AND PLAN 75 y.o. year old female  has a past medical history of Allergic rhinitis, Anxiety, Arthritis, Asthma, Back pain, Blood transfusion without reported diagnosis, Colon polyps, Constipation, Diverticulosis, Dysrhythmia (1999), Elevated LFTs, Fatty liver disease, nonalcoholic, GERD (gastroesophageal reflux disease), Headache(784.0), Heart murmur, Hypertension, IBS (irritable bowel syndrome), Joint pain, Migraines, Muscle cramps, Neuromuscular disorder ([place]), Obesity, Palpitations, Pneumonia (2007), Renal cell carcinoma ([place]) (2013), Restless leg syndrome, Sleep apnea, TIA (transient ischemic attack) (09/11/2014), Ulcer, Urinary incontinence, and Vitamin D deficiency. here with     ICD-10-CM   1. Obstructive sleep apnea on CPAP  G47.33 For home use only DME continuous positive airway pressure (CPAP)   Z99.89        [name] is doing very well on CPAP. She has adjusted to her new machine. Compliance report shows excellent compliance. I have encouraged her to monitor for air leak. May consider mask refitting if needed. AHI is well managed.  She will continue Nurtec as needed for abortive therapy.  Adequate hydration and healthy lifestyle habits encouraged.  She will follow-up with me in 1 year, sooner if needed.  She verbalizes understanding and agreement with this plan.   Orders Placed This Encounter  Procedures   For home use only DME continuous positive airway pressure (CPAP)    Supplies    Order Specific Question:   Length of Need    Answer:   Lifetime    Order Specific Question:   Patient has OSA or probable OSA     Answer:   Yes    Order Specific Question:   Is the patient currently using CPAP in the home    Answer:   Yes    Order Specific Question:   Settings    Answer:   Other see comments    Order Specific Question:   CPAP supplies needed    Answer:   Mask, headgear, cushions, filters, heated tubing and water chamber      No orders of the defined types were placed in this encounter.     Debbora Presto, FNP-C 02/08/2021, 11:14 AM [facility] [address], [place], [place] [address] [phone]

## 2021-02-08 NOTE — Patient Instructions (Signed)

## 2021-02-10 NOTE — Progress Notes (Signed)
I agree with the  plan as directed by NP on this visit . I was available for consultation.   Larey Seat, MD

## 2021-02-10 NOTE — Progress Notes (Signed)
I agree with the assessment and plan as directed by NP on this visit . I was available for consultation.   Larey Seat, MD

## 2021-04-26 ENCOUNTER — Other Ambulatory Visit: Payer: Self-pay

## 2021-04-26 ENCOUNTER — Telehealth: Payer: Self-pay | Admitting: Orthopaedic Surgery

## 2021-04-26 DIAGNOSIS — M4316 Spondylolisthesis, lumbar region: Secondary | ICD-10-CM

## 2021-04-26 NOTE — Telephone Encounter (Signed)
[place] for referral to Dr Sedlack Old for appropriate spinal injection

## 2021-04-26 NOTE — Telephone Encounter (Signed)
Called and relayed to patient that order has been entered for injection in lower back with Dr.Newton.

## 2021-04-26 NOTE — Telephone Encounter (Signed)
Pt called stating she would like to get a referral put in for Dr.Newton. She states she discussed getting back injections with Dr.Whitfield if her pain continued and it has.  [phone]

## 2021-04-30 ENCOUNTER — Ambulatory Visit (INDEPENDENT_AMBULATORY_CARE_PROVIDER_SITE_OTHER): Payer: Medicare Other | Admitting: Physical Medicine and Rehabilitation

## 2021-04-30 ENCOUNTER — Encounter: Payer: Self-pay | Admitting: Physical Medicine and Rehabilitation

## 2021-04-30 ENCOUNTER — Other Ambulatory Visit: Payer: Self-pay

## 2021-04-30 VITALS — BP 134/85 | HR 81

## 2021-04-30 DIAGNOSIS — M47816 Spondylosis without myelopathy or radiculopathy, lumbar region: Secondary | ICD-10-CM

## 2021-04-30 DIAGNOSIS — M48062 Spinal stenosis, lumbar region with neurogenic claudication: Secondary | ICD-10-CM

## 2021-04-30 DIAGNOSIS — M4726 Other spondylosis with radiculopathy, lumbar region: Secondary | ICD-10-CM

## 2021-04-30 NOTE — Progress Notes (Signed)
Pt state lower back pain that travels down both legs but mostly her left hip and leg. Pt state the pain travels to her left knee and foot. Pt state walking, standing and laying down makes the pain worse. Pt state climbing stairs cause sum pain. Pt state the pain keeps her up at night. Pt state she takes over the counter pain meds and pain patches to help ease her pain.  Numeric Pain Rating Scale and Functional Assessment Average Pain 9 Pain Right Now 5 My pain is constant, sharp, burning, dull, tingling, and aching Pain is worse with: walking, sitting, standing, some activites, and laying down Pain improves with: heat/ice and medication   In the last MONTH (on 0-10 scale) has pain interfered with the following?  1. General activity like being  able to carry out your everyday physical activities such as walking, climbing stairs, carrying groceries, or moving a chair?  Rating(7)  2. Relation with others like being able to carry out your usual social activities and roles such as  activities at home, at work and in your community. Rating(8)  3. Enjoyment of life such that you have  been bothered by emotional problems such as feeling anxious, depressed or irritable?  Rating(9)

## 2021-04-30 NOTE — Progress Notes (Signed)
[name] - 75 y.o. female MRN [id number]  Date of birth: 1945-07-21  Office Visit Note: Visit Date: 04/30/2021 PCP: Deland Pretty, MD Referred by: Deland Pretty, MD  Subjective: Chief Complaint  Patient presents with   Lower Back - Pain   Left Leg - Pain   Right Leg - Pain   Left Knee - Pain   Left Foot - Pain   HPI: Jamie [name] is a 75 y.o. female who comes in today per the request of Dr. Joni Fears for evaluation of bilateral lower back pain radiating to hips, lateral legs and down to feet, left greater than right.  Patient reports sensation of numbness and tingling to bilateral lower legs.  Patient reports pain has been ongoing for several months.  Patient reports pain is exacerbated by prolonged walking and standing, currently describes as soreness and throbbing sensation, rates as 8 out of 10.  Patient reports some relief of pain with rest and use of ibuprofen.  Patient states she did attend formal physical therapy at [facility] several years ago which did help to relieve her pain.  Patient's recent lumbar MRI from February exhibits multilevel degenerative changes, marked facet arthropathy and mild spinal canal stenosis at the level of L4-L5.  No high-grade spinal canal stenosis noted.  Patient received a left greater trochanteric bursa injection in March of this year by Dr. Joni Fears which she reports gave her minimal relief of pain and states she does not feel like this injection worked.  Patient states she is a very active person and has noticed that she has become more sedentary recently, patient reports a recent weight gain of 30 pounds.  Patient denies focal weakness, numbness and tingling.  Patient denies recent traumas or falls.  Patient's course is complicated by restless leg syndrome, hypertension, TIA and obstructive sleep apnea.  Patient states she does take Skelaxin as needed to help with the cramping from her restless leg  syndrome.  Review of Systems  Musculoskeletal:  Positive for back pain.  Neurological:  Positive for tingling and sensory change. Negative for focal weakness and weakness.  All other systems reviewed and are negative. Otherwise per HPI.  Assessment & Plan: Visit Diagnoses:    ICD-10-CM   1. Other spondylosis with radiculopathy, lumbar region  M47.26 Ambulatory referral to Physical Medicine Rehab    2. Spinal stenosis of lumbar region with neurogenic claudication  M48.062     3. Facet arthropathy, lumbar  M47.816        Plan: Findings:  Chronic, worsening and severe bilateral lower back pain radiating to hips, lateral legs and down to feet, left greater than right.  Patient continues to have excruciating pain despite good conservative therapies such as formal physical therapy, rest and use of medications.  Patient's clinical presentation and exam are consistent with neurogenic claudication as a result of spinal canal stenosis.  We did discuss patient's lumbar MRI in detail with patient and her husband today using images and spine model.  Patient does have moderate facet arthropathy and mild spinal canal stenosis at the level of L4-L5 on recent lumbar MRI.  We feel the next step would be to perform a diagnostic and hopefully therapeutic left L4-L5 interlaminar epidural steroid injection under fluoroscopic guidance.  Differential diagnosis includes facet joint syndrome with referral pattern Duda facet arthritis that mimics nerve root irritation.  Would consider diagnostic facet blocks as well at some point.  Patient is not currently undergoing anticoagulant  therapy.  If patient does well with the epidural injection we will continue to monitor closely, however if her pain does persist we will consider regrouping with physical therapy and discuss medications that could help to alleviate some of her pain.  Patient encouraged to continue being active as tolerated.  Patient instructed to take 500 mg of  Tylenol by mouth 4 times a day to help manage pain at home.  No red flag symptoms noted today upon exam.     Meds & Orders: No orders of the defined types were placed in this encounter.   Orders Placed This Encounter  Procedures   Ambulatory referral to Physical Medicine Rehab    Follow-up: Return in about 1 week (around 05/07/2021) for Left L4-L5 interlaminar epidural steroid injection.   Procedures: No procedures performed      Clinical History: MRI LUMBAR SPINE WITHOUT CONTRAST   TECHNIQUE: Multiplanar, multisequence MR imaging of the lumbar spine was performed. No intravenous contrast was administered.   COMPARISON:  None.   FINDINGS: Segmentation:  Standard.   Alignment:  Trace anterolisthesis at L4-L5.   Vertebrae: Vertebral body heights are maintained. No marrow edema. No suspicious osseous lesion. Small vertebral body hemangioma at T12.   Conus medullaris and cauda equina: Conus extends to the L1 level. Conus and cauda equina appear normal.   Paraspinal and other soft tissues: Unremarkable.   Disc levels:   L1-L2:  No canal or foraminal stenosis.   L2-L3:  No canal or foraminal stenosis.   L3-L4: Disc bulge. No canal or foraminal stenosis. Slight effacement of the subarticular recesses.   L4-L5: Anterolisthesis with uncovering of disc bulge. Marked facet arthropathy with ligamentum flavum infolding. Mild canal stenosis. Partial effacement of the subarticular recesses. No foraminal stenosis.   L5-S1:  Mild facet arthropathy.  No canal or foraminal stenosis.   IMPRESSION: Multilevel degenerative changes as detailed above. There is marked facet arthropathy at L4-L5 with trace anterolisthesis. Mild canal stenosis and partial effacement of the subarticular recesses at this level.     Electronically Signed   By: Macy Mis M.D.   On: 08/30/2020 16:36   She reports that she has never smoked. She has never used smokeless tobacco. No results for  input(s): HGBA1C, LABURIC in the last 8760 hours.  Objective:  VS:  HT:    WT:   BMI:     BP:134/85  HR:81bpm  TEMP: ( )  RESP:  Physical Exam Vitals and nursing note reviewed.  HENT:     Head: Normocephalic and atraumatic.     Right Ear: External ear normal.     Left Ear: External ear normal.     Nose: Nose normal.     Mouth/Throat:     Mouth: Mucous membranes are moist.  Eyes:     Extraocular Movements: Extraocular movements intact.  Cardiovascular:     Rate and Rhythm: Normal rate.     Pulses: Normal pulses.  Pulmonary:     Effort: Pulmonary effort is normal.  Abdominal:     General: Abdomen is flat. There is no distension.  Musculoskeletal:        General: Tenderness present.     Cervical back: Normal range of motion.     Comments: Pt is slow to rise from seated position to standing. Good lumbar range of motion. Strong distal strength without clonus, no pain upon palpation of greater trochanters. Sensation intact bilaterally. Paraesthesias noted to bilateral legs. Walks independently, gait steady.     Skin:  General: Skin is warm and dry.     Capillary Refill: Capillary refill takes less than 2 seconds.  Neurological:     General: No focal deficit present.     Mental Status: She is alert.  Psychiatric:        Mood and Affect: Mood normal.    Ortho Exam  Imaging: No results found.  Past Medical/Family/Surgical/Social History: Medications & Allergies reviewed per EMR, new medications updated. 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) 11/21/2016   Diverticulitis 11/20/2016   Pain in left ankle and joints of left foot 08/13/2016   Pain and swelling of left ankle 08/13/2016   At risk for polypharmacy 10/02/2015   Intractable migraine with aura with status migrainosus 10/02/2015   Other specified transient cerebral ischemias 09/16/2014   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   Morbid obesity ([place]) 09/16/2014   Headache 05/03/2013   Preop exam for internal medicine 03/10/2012   Kidney mass 03/10/2012   UTI (lower urinary tract infection) 02/13/2012   Excessive tearing 12/16/2011   Leg pain 09/20/2011   Acute bronchitis 06/27/2011   Wheezing 06/27/2011   Rosacea 05/07/2011   Shoulder pain, right 05/07/2011   OSA (obstructive sleep apnea) 01/31/2011   Confusion state 11/26/2010   ABNORMAL ELECTROCARDIOGRAM 08/28/2010   Tinnitus 08/09/2010   SINUSITIS, CHRONIC 08/09/2010   Dizziness and giddiness 01/18/2010   HYPERGLYCEMIA 08/23/2009   LIVER FUNCTION TESTS, ABNORMAL, HX OF 08/23/2009   Fatigue 01/16/2009   PERSONAL HX COLONIC POLYPS 09/21/2008   ADENOMATOUS COLONIC POLYP 09/20/2008   ALLERGIC RHINITIS 04/28/2008   RESTRICTIVE LUNG DISEASE 04/28/2008   CRAMPS,LEG 09/28/2007   OBESITY 04/01/2007   VOCAL CORD DISORDER 04/01/2007   REDUCTION MAMMOPLASTY, HX OF 04/01/2007   RESTLESS LEG SYNDROME 02/02/2007   Essential hypertension 02/02/2007   MITRAL VALVE PROLAPSE 02/02/2007   GERD 02/02/2007   Irritable bowel syndrome 02/02/2007   BREAST CYST 02/02/2007   Past Medical History:  Diagnosis Date   Allergic rhinitis    Anxiety    Arthritis    left foot   Asthma    Back pain    Blood transfusion without reported diagnosis    Colon polyps    adenomatous   Constipation    Diverticulosis    Dysrhythmia 1999   Dr Johnsie Cancel 2003   Elevated LFTs    Fatty liver disease, nonalcoholic     GERD (gastroesophageal reflux disease)    Headache(784.0)    migraines with vision changes but no pain   Heart murmur    Hypertension    IBS (irritable bowel syndrome)    Joint pain    Migraines    Muscle cramps    Neuromuscular disorder ([place])    RLS   Obesity    Palpitations    Pneumonia 2007   Renal cell carcinoma ([place]) 2013   minimal partial nephrectomy   Restless leg syndrome    Sleep apnea    moderate; wears CPAP, uncertain about setting  TIA (transient ischemic attack) 09/11/2014   Ulcer    Urinary incontinence    Vitamin D deficiency    Family History  Problem Relation Age of Onset   Heart disease Father 76   Lung cancer Father        smoker   Hypertension Mother    Anxiety disorder Mother    Heart disease Brother    Hypertension Other    Colon cancer Cousin    Allergies Daughter    Allergies Brother    Melanoma Daughter    Lung cancer Sister    Liver disease Neg Hx    Asthma Neg Hx    Esophageal cancer Neg Hx    Rectal cancer Neg Hx    Stomach cancer Neg Hx    Liver cancer Neg Hx    Past Surgical History:  Procedure Laterality Date   BREAST REDUCTION SURGERY  Jan 1991   ELBOW SURGERY Left    KIDNEY SURGERY Right October 2013    Tumor removed    LAPAROSCOPIC APPENDECTOMY N/A 06/08/2018   Procedure: APPENDECTOMY LAPAROSCOPIC;  Surgeon: Jovita Kussmaul, MD;  Location: [facility];  Service: General;  Laterality: N/A;   TONSILLECTOMY     VAGINAL HYSTERECTOMY  1979   partial   Social History   Occupational History   Occupation: Retired    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

## 2021-05-08 DIAGNOSIS — L738 Other specified follicular disorders: Secondary | ICD-10-CM | POA: Diagnosis not present

## 2021-05-08 DIAGNOSIS — L821 Other seborrheic keratosis: Secondary | ICD-10-CM | POA: Diagnosis not present

## 2021-05-08 DIAGNOSIS — D485 Neoplasm of uncertain behavior of skin: Secondary | ICD-10-CM | POA: Diagnosis not present

## 2021-05-08 DIAGNOSIS — L570 Actinic keratosis: Secondary | ICD-10-CM | POA: Diagnosis not present

## 2021-05-08 DIAGNOSIS — D1801 Hemangioma of skin and subcutaneous tissue: Secondary | ICD-10-CM | POA: Diagnosis not present

## 2021-05-10 ENCOUNTER — Ambulatory Visit (INDEPENDENT_AMBULATORY_CARE_PROVIDER_SITE_OTHER): Payer: Medicare Other | Admitting: Physical Medicine and Rehabilitation

## 2021-05-10 ENCOUNTER — Encounter: Payer: Self-pay | Admitting: Physical Medicine and Rehabilitation

## 2021-05-10 ENCOUNTER — Other Ambulatory Visit: Payer: Self-pay

## 2021-05-10 ENCOUNTER — Ambulatory Visit: Payer: Self-pay

## 2021-05-10 VITALS — BP 115/76 | HR 92

## 2021-05-10 DIAGNOSIS — M48062 Spinal stenosis, lumbar region with neurogenic claudication: Secondary | ICD-10-CM | POA: Diagnosis not present

## 2021-05-10 MED ORDER — BETAMETHASONE SOD PHOS & ACET 6 (3-3) MG/ML IJ SUSP
12.0000 mg | Freq: Once | INTRAMUSCULAR | Status: AC
  Administered 2021-05-10: 12 mg

## 2021-05-10 NOTE — Progress Notes (Signed)
Pt state lower back pain that travels down both legs but mostly her left hip and leg. Pt state the pain travels to her left knee and foot. Pt state walking, standing and laying down makes the pain worse. Pt state climbing stairs cause sum pain. Pt state the pain keeps her up at night. Pt state she takes over the counter pain meds and pain patches to help ease her pain.  Numeric Pain Rating Scale and Functional Assessment Average Pain 6   In the last MONTH (on 0-10 scale) has pain interfered with the following?  1. General activity like being  able to carry out your everyday physical activities such as walking, climbing stairs, carrying groceries, or moving a chair?  Rating(10)   +Driver, -BT, -Dye Allergies.

## 2021-05-10 NOTE — Patient Instructions (Signed)

## 2021-05-12 NOTE — Progress Notes (Signed)
Jamie [name] - 75 y.o. female MRN [id number]  Date of birth: 14-Oct-1945  Office Visit Note: Visit Date: 05/10/2021 PCP: Deland Pretty, MD Referred by: Deland Pretty, MD  Subjective: Chief Complaint  Patient presents with   Lower Back - Pain   Left Leg - Pain   Right Leg - Pain   Left Hip - Pain   HPI:  Jamie [name] is a 75 y.o. female who comes in today at the request of Barnet Pall, FNP for planned Left L4-5 Lumbar Interlaminar epidural steroid injection with fluoroscopic guidance.  The patient has failed conservative care including home exercise, medications, time and activity modification.  This injection will be diagnostic and hopefully therapeutic.  Please see requesting physician notes for further details and justification. MRI reviewed with images and spine model.  MRI reviewed in the note below.  Depending on relief consider facet joint block.  Patient's history is complicated by multiple drug intolerances including history of use of buprenorphine.  She does have a history of multiple joint pain and spasms.  No documented history of fibromyalgia.  ROS Otherwise per HPI.  Assessment & Plan: Visit Diagnoses:    ICD-10-CM   1. Spinal stenosis of lumbar region with neurogenic claudication  M48.062 XR C-ARM NO REPORT    Epidural Steroid injection    betamethasone acetate-betamethasone sodium phosphate (CELESTONE) injection 12 mg      Plan: No additional findings.   Meds & Orders:  Meds ordered this encounter  Medications   betamethasone acetate-betamethasone sodium phosphate (CELESTONE) injection 12 mg    Orders Placed This Encounter  Procedures   XR C-ARM NO REPORT   Epidural Steroid injection    Follow-up: Return if symptoms worsen or fail to improve.   Procedures: No procedures performed  Lumbar Epidural Steroid Injection - Interlaminar Approach with Fluoroscopic Guidance  Patient: Jamie [name]      Date of Birth: [birth year] MRN: [id number] PCP:  Deland Pretty, MD      Visit Date: 05/10/2021   Universal Protocol:     Consent Given By: the patient  Position: PRONE  Additional Comments: Vital signs were monitored before and after the procedure. Patient was prepped and draped in the usual sterile fashion. The correct patient, procedure, and site was verified.   Injection Procedure Details:   Procedure diagnoses: Spinal stenosis of lumbar region with neurogenic claudication [M48.062]   Meds Administered:  Meds ordered this encounter  Medications   betamethasone acetate-betamethasone sodium phosphate (CELESTONE) injection 12 mg     Laterality: Left  Location/Site:  L4-5  Needle: 4.5 in., 20 ga. Tuohy  Needle Placement: Paramedian epidural  Findings:   -Comments: Excellent flow of contrast into the epidural space.  Procedure Details: Using a paramedian approach from the side mentioned above, the region overlying the inferior lamina was localized under fluoroscopic visualization and the soft tissues overlying this structure were infiltrated with 4 ml. of 1% Lidocaine without Epinephrine. The Tuohy needle was inserted into the epidural space using a paramedian approach.   The epidural space was localized using loss of resistance along with counter oblique bi-planar fluoroscopic views.  After negative aspirate for air, blood, and CSF, a 2 ml. volume of Isovue-250 was injected into the epidural space and the flow of contrast was observed. Radiographs were obtained for documentation purposes.    The injectate was administered into the level noted above.   Additional Comments:  The patient tolerated the procedure well Dressing: 2 x 2 sterile gauze  and Band-Aid    Post-procedure details: Patient was observed during the procedure. Post-procedure instructions were reviewed.  Patient left the clinic in stable condition.    Clinical History: MRI LUMBAR SPINE WITHOUT CONTRAST   TECHNIQUE: Multiplanar, multisequence MR  imaging of the lumbar spine was performed. No intravenous contrast was administered.   COMPARISON:  None.   FINDINGS: Segmentation:  Standard.   Alignment:  Trace anterolisthesis at L4-L5.   Vertebrae: Vertebral body heights are maintained. No marrow edema. No suspicious osseous lesion. Small vertebral body hemangioma at T12.   Conus medullaris and cauda equina: Conus extends to the L1 level. Conus and cauda equina appear normal.   Paraspinal and other soft tissues: Unremarkable.   Disc levels:   L1-L2:  No canal or foraminal stenosis.   L2-L3:  No canal or foraminal stenosis.   L3-L4: Disc bulge. No canal or foraminal stenosis. Slight effacement of the subarticular recesses.   L4-L5: Anterolisthesis with uncovering of disc bulge. Marked facet arthropathy with ligamentum flavum infolding. Mild canal stenosis. Partial effacement of the subarticular recesses. No foraminal stenosis.   L5-S1:  Mild facet arthropathy.  No canal or foraminal stenosis.   IMPRESSION: Multilevel degenerative changes as detailed above. There is marked facet arthropathy at L4-L5 with trace anterolisthesis. Mild canal stenosis and partial effacement of the subarticular recesses at this level.     Electronically Signed   By: Macy Mis M.D.   On: 08/30/2020 16:36     Objective:  VS:  HT:    WT:   BMI:     BP:115/76  HR:92bpm  TEMP: ( )  RESP:  Physical Exam Vitals and nursing note reviewed.  Constitutional:      General: She is not in acute distress.    Appearance: Normal appearance. She is obese. She is not ill-appearing.  HENT:     Head: Normocephalic and atraumatic.     Right Ear: External ear normal.     Left Ear: External ear normal.  Eyes:     Extraocular Movements: Extraocular movements intact.  Cardiovascular:     Rate and Rhythm: Normal rate.     Pulses: Normal pulses.  Pulmonary:     Effort: Pulmonary effort is normal. No respiratory distress.  Abdominal:      General: There is no distension.     Palpations: Abdomen is soft.  Musculoskeletal:        General: Tenderness present.     Cervical back: Neck supple.     Right lower leg: No edema.     Left lower leg: No edema.     Comments: Patient has good distal strength with no pain over the greater trochanters.  No clonus or focal weakness.  Skin:    Findings: No erythema, lesion or rash.  Neurological:     General: No focal deficit present.     Mental Status: She is alert and oriented to person, place, and time.     Sensory: No sensory deficit.     Motor: No weakness or abnormal muscle tone.     Coordination: Coordination normal.  Psychiatric:        Mood and Affect: Mood normal.        Behavior: Behavior normal.     Imaging: No results found.

## 2021-05-12 NOTE — Procedures (Signed)
Lumbar Epidural Steroid Injection - Interlaminar Approach with Fluoroscopic Guidance  Patient: Jamie [name]      Date of Birth: [birth year] MRN: [id number] PCP: Deland Pretty, MD      Visit Date: 05/10/2021   Universal Protocol:     Consent Given By: the patient  Position: PRONE  Additional Comments: Vital signs were monitored before and after the procedure. Patient was prepped and draped in the usual sterile fashion. The correct patient, procedure, and site was verified.   Injection Procedure Details:   Procedure diagnoses: Spinal stenosis of lumbar region with neurogenic claudication [M48.062]   Meds Administered:  Meds ordered this encounter  Medications   betamethasone acetate-betamethasone sodium phosphate (CELESTONE) injection 12 mg     Laterality: Left  Location/Site:  L4-5  Needle: 4.5 in., 20 ga. Tuohy  Needle Placement: Paramedian epidural  Findings:   -Comments: Excellent flow of contrast into the epidural space.  Procedure Details: Using a paramedian approach from the side mentioned above, the region overlying the inferior lamina was localized under fluoroscopic visualization and the soft tissues overlying this structure were infiltrated with 4 ml. of 1% Lidocaine without Epinephrine. The Tuohy needle was inserted into the epidural space using a paramedian approach.   The epidural space was localized using loss of resistance along with counter oblique bi-planar fluoroscopic views.  After negative aspirate for air, blood, and CSF, a 2 ml. volume of Isovue-250 was injected into the epidural space and the flow of contrast was observed. Radiographs were obtained for documentation purposes.    The injectate was administered into the level noted above.   Additional Comments:  The patient tolerated the procedure well Dressing: 2 x 2 sterile gauze and Band-Aid    Post-procedure details: Patient was observed during the procedure. Post-procedure instructions  were reviewed.  Patient left the clinic in stable condition.

## 2021-06-19 DIAGNOSIS — N390 Urinary tract infection, site not specified: Secondary | ICD-10-CM | POA: Diagnosis not present

## 2021-07-13 ENCOUNTER — Other Ambulatory Visit: Payer: Self-pay

## 2021-07-13 ENCOUNTER — Telehealth: Payer: Self-pay | Admitting: Internal Medicine

## 2021-07-13 MED ORDER — AMOXICILLIN-POT CLAVULANATE 875-125 MG PO TABS
1.0000 | ORAL_TABLET | Freq: Two times a day (BID) | ORAL | 0 refills | Status: DC
Start: 2021-07-13 — End: 2021-08-23

## 2021-07-13 NOTE — Telephone Encounter (Signed)
Called pt and gave pt Dr. Blanch Media recommendations. Sent Augmentin to pt's pharmacy. Pt is scheduled for a follow up appt with Ellouise Newer on 07/30/21 at 10 am. Pt verbalized understanding and had no concerns at end of call.

## 2021-07-13 NOTE — Telephone Encounter (Signed)
Recommend the following: 1.  Augmentin 875 mg twice daily for 10 days; #20; no refills.  Please prescribe 2.  Low residue diet.  Advance as tolerated when feeling better 3.  Exercise Tylenol as needed for pain 4.  Routine office follow-up with advanced practitioner or myself in 2 weeks 5.  In the interim, if symptoms were to get severe and unmanageable, proceed to ER Thanks, Dr. Henrene Pastor

## 2021-07-13 NOTE — Telephone Encounter (Signed)
Inbound call from patient states she is having pain when she goes to the bathroom. Believes she is having a diverticulitis flare

## 2021-07-13 NOTE — Telephone Encounter (Signed)
Returned patient's call. Pt believes she is having a diverticulitis flare. Pt reports gas and pain  6 or 7/10 in her lower abd below navel. Pt doesn't believe she has a fever. Pt stated pain started Tuesday night. Pt reports having about 2 formed bm's per day. Please advise.

## 2021-07-26 ENCOUNTER — Ambulatory Visit: Payer: Medicare Other | Admitting: Internal Medicine

## 2021-07-30 ENCOUNTER — Ambulatory Visit: Payer: Medicare Other | Admitting: Physician Assistant

## 2021-08-09 DIAGNOSIS — I10 Essential (primary) hypertension: Secondary | ICD-10-CM | POA: Diagnosis not present

## 2021-08-09 DIAGNOSIS — R7303 Prediabetes: Secondary | ICD-10-CM | POA: Diagnosis not present

## 2021-08-17 DIAGNOSIS — M5442 Lumbago with sciatica, left side: Secondary | ICD-10-CM | POA: Diagnosis not present

## 2021-08-17 DIAGNOSIS — J309 Allergic rhinitis, unspecified: Secondary | ICD-10-CM | POA: Diagnosis not present

## 2021-08-17 DIAGNOSIS — F5104 Psychophysiologic insomnia: Secondary | ICD-10-CM | POA: Diagnosis not present

## 2021-08-17 DIAGNOSIS — G2581 Restless legs syndrome: Secondary | ICD-10-CM | POA: Diagnosis not present

## 2021-08-17 DIAGNOSIS — M8580 Other specified disorders of bone density and structure, unspecified site: Secondary | ICD-10-CM | POA: Diagnosis not present

## 2021-08-22 NOTE — Progress Notes (Signed)
I, Jamie [name], LAT, ATC acting as a scribe for Jamie Leader, MD.  Subjective:    CC: Low back pain  HPI: Pt is a 76 y/o female c/o LBP. 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 Dr. Durward Fortes performed a L GT steroid injection in March of 2022 w/ minimal relief.  Pt only got about 1 week of relief from prior ESI. Pt c/o sleep disturbance due to LBP and RLS and the rx Dr. Shelia Media prescribed is causing a dry cough. Pt notes her low back doesn't particularly hurt, but locates her pain to the radicular symptoms in her R buttock and R leg distally sometimes to R foot.   Radiating pain: yes LE numbness/tingling: yes LE weakness: yes Aggravates: walking, standing, stairs Treatments tried: prior PT, IBU, Tylenol, ESI  Pertinent review of Systems: No fevers or chills  Relevant historical information: Restless leg syndrome.  Elevated ionized calcium.   Objective:    Vitals:   08/23/21 1231  BP: 118/80  Pulse: 80  SpO2: 96%   General: Well Developed, well nourished, and in no acute distress.   MSK:  L-spine: Nontender midline decreased lumbar motion.  Lower extremity strength is intact except noted below.  Left hip normal-appearing Tender palpation greater trochanter. Decreased hip motion pain with flexion and internal rotation. Hip abduction strength is diminished with pain.   External rotation strength is diminished with some pain.  Leg lengths are equal.  Lab and Radiology Results  X-ray images left hip and L-spine obtained today personally and independently interpreted.  Left hip: Mild left hip DJD. Enthesiopathy greater trochanter.  No acute fractures.   L-spine: Anterolisthesis L4-L5.  No acute fractures.   Await formal radiology review   EXAM: MRI LUMBAR SPINE WITHOUT CONTRAST   TECHNIQUE: Multiplanar, multisequence  MR imaging of the lumbar spine was performed. No intravenous contrast was administered.   COMPARISON:  None.   FINDINGS: Segmentation:  Standard.   Alignment:  Trace anterolisthesis at L4-L5.   Vertebrae: Vertebral body heights are maintained. No marrow edema. No suspicious osseous lesion. Small vertebral body hemangioma at T12.   Conus medullaris and cauda equina: Conus extends to the L1 level. Conus and cauda equina appear normal.   Paraspinal and other soft tissues: Unremarkable.   Disc levels:   L1-L2:  No canal or foraminal stenosis.   L2-L3:  No canal or foraminal stenosis.   L3-L4: Disc bulge. No canal or foraminal stenosis. Slight effacement of the subarticular recesses.   L4-L5: Anterolisthesis with uncovering of disc bulge. Marked facet arthropathy with ligamentum flavum infolding. Mild canal stenosis. Partial effacement of the subarticular recesses. No foraminal stenosis.   L5-S1:  Mild facet arthropathy.  No canal or foraminal stenosis.   IMPRESSION: Multilevel degenerative changes as detailed above. There is marked facet arthropathy at L4-L5 with trace anterolisthesis. Mild canal stenosis and partial effacement of the subarticular recesses at this level.     Electronically Signed   By: Macy Mis M.D.   On: 08/30/2020 16:36  I, Jamie [name], personally (independently) visualized and performed the interpretation of the images attached in this note.    Impression and Recommendations:    Assessment and Plan: 76 y.o. female with left lateral hip and buttocks pain with pain radiating down the left leg in an L5 dermatomal pattern.  I think this is likely  to be more than 1 problem.  Her pain is most consistent with greater trochanteric bursitis and left lumbar radiculopathy.  However she has had reasonable treatment for this already with Dr. Ernestina Patches and Dr. Donne Hazel with an epidural steroid injection at L4-5 and a greater trochanteric bursa injection  neither of which provided benefit longer than 1 week.  She has not had a trial of physical therapy yet which I think is a reasonable next step.  She mentions that she has an established relationship with a physical therapist at [facility]) would like to be referred to that location.  This is reasonable.  Plan to repeat x-ray L-spine today as last imaging was about a year ago and obtain x-ray of hip.   Additionally we spent a fair amount of time talking about her muscle spasms at night and her concern over her medication.  Her primary care provider Dr. Shelia Media has been attempting to wean her off of clonazepam and Skelaxin and trying to transition to low-dose gabapentin and Lunesta at bedtime.  She is not happy with this and is thinking about switching to a new primary care provider. I expressed that generally most primary care providers will agree that people over the age of 44 should be weaned off of benzodiazepines and muscle relaxers.  Happy to continue to work on this issue going forward.  Recheck in 6 weeks.  PDMP not reviewed this encounter. Orders Placed This Encounter  Procedures   DG Lumbar Spine 2-3 Views    Standing Status:   Future    Number of Occurrences:   1    Standing Expiration Date:   08/23/2022    Order Specific Question:   Reason for Exam (SYMPTOM  OR DIAGNOSIS REQUIRED)    Answer:   lumbar radiulopathy    Order Specific Question:   Preferred imaging location?    Answer:   [place]   DG HIP UNILAT W OR W/O PELVIS 2-3 VIEWS LEFT    Standing Status:   Future    Number of Occurrences:   1    Standing Expiration Date:   08/23/2022    Order Specific Question:   Reason for Exam (SYMPTOM  OR DIAGNOSIS REQUIRED)    Answer:   lumbar radiculopathy    Order Specific Question:   Preferred imaging location?    Answer:   [place]   Ambulatory referral to Physical Therapy    Referral Priority:   Routine    Referral Type:   Physical  Medicine    Referral Reason:   Specialty Services Required    Requested Specialty:   Physical Therapy    Number of Visits Requested:   1   No orders of the defined types were placed in this encounter.   Discussed warning signs or symptoms. Please see discharge instructions. Patient expresses understanding.   The above documentation has been reviewed and is accurate and complete Jamie [name], M.D.   Total encounter time 45 minutes including face-to-face time with the patient and, reviewing past medical record, and charting on the date of service.

## 2021-08-23 ENCOUNTER — Ambulatory Visit (INDEPENDENT_AMBULATORY_CARE_PROVIDER_SITE_OTHER): Payer: Medicare Other

## 2021-08-23 ENCOUNTER — Ambulatory Visit (INDEPENDENT_AMBULATORY_CARE_PROVIDER_SITE_OTHER): Payer: Medicare Other | Admitting: Family Medicine

## 2021-08-23 ENCOUNTER — Other Ambulatory Visit: Payer: Self-pay

## 2021-08-23 VITALS — BP 118/80 | HR 80 | Ht 62.0 in | Wt 198.0 lb

## 2021-08-23 DIAGNOSIS — M7062 Trochanteric bursitis, left hip: Secondary | ICD-10-CM | POA: Diagnosis not present

## 2021-08-23 DIAGNOSIS — M48061 Spinal stenosis, lumbar region without neurogenic claudication: Secondary | ICD-10-CM | POA: Diagnosis not present

## 2021-08-23 DIAGNOSIS — M5416 Radiculopathy, lumbar region: Secondary | ICD-10-CM | POA: Diagnosis not present

## 2021-08-23 DIAGNOSIS — M4727 Other spondylosis with radiculopathy, lumbosacral region: Secondary | ICD-10-CM | POA: Diagnosis not present

## 2021-08-23 DIAGNOSIS — M25552 Pain in left hip: Secondary | ICD-10-CM | POA: Diagnosis not present

## 2021-08-23 NOTE — Patient Instructions (Addendum)
Thank you for coming in today.   I've referred you to Physical Therapy.  Let us know if you don't hear from them in one week.   Please get an Xray today before you leave   See the list for new primary care options   Recheck back in 6 weeks.

## 2021-08-27 NOTE — Progress Notes (Signed)
Left hip x-ray looks normal to radiology.

## 2021-08-27 NOTE — Progress Notes (Signed)
Lumbar spine x-ray shows arthritis changes present in the base of the spine at L4-5 and L5-S1.

## 2021-09-06 ENCOUNTER — Encounter: Payer: Self-pay | Admitting: Internal Medicine

## 2021-09-06 ENCOUNTER — Ambulatory Visit (INDEPENDENT_AMBULATORY_CARE_PROVIDER_SITE_OTHER): Payer: Medicare Other | Admitting: Internal Medicine

## 2021-09-06 ENCOUNTER — Other Ambulatory Visit (INDEPENDENT_AMBULATORY_CARE_PROVIDER_SITE_OTHER): Payer: Medicare Other

## 2021-09-06 VITALS — BP 122/84 | HR 76 | Ht 62.0 in | Wt 195.2 lb

## 2021-09-06 DIAGNOSIS — K219 Gastro-esophageal reflux disease without esophagitis: Secondary | ICD-10-CM

## 2021-09-06 DIAGNOSIS — R1030 Lower abdominal pain, unspecified: Secondary | ICD-10-CM | POA: Diagnosis not present

## 2021-09-06 DIAGNOSIS — Z79899 Other long term (current) drug therapy: Secondary | ICD-10-CM | POA: Diagnosis not present

## 2021-09-06 DIAGNOSIS — K589 Irritable bowel syndrome without diarrhea: Secondary | ICD-10-CM | POA: Diagnosis not present

## 2021-09-06 DIAGNOSIS — K5901 Slow transit constipation: Secondary | ICD-10-CM | POA: Diagnosis not present

## 2021-09-06 DIAGNOSIS — K5792 Diverticulitis of intestine, part unspecified, without perforation or abscess without bleeding: Secondary | ICD-10-CM

## 2021-09-06 DIAGNOSIS — Z86010 Personal history of colonic polyps: Secondary | ICD-10-CM | POA: Diagnosis not present

## 2021-09-06 DIAGNOSIS — G2581 Restless legs syndrome: Secondary | ICD-10-CM | POA: Diagnosis not present

## 2021-09-06 DIAGNOSIS — M8589 Other specified disorders of bone density and structure, multiple sites: Secondary | ICD-10-CM | POA: Diagnosis not present

## 2021-09-06 DIAGNOSIS — F5104 Psychophysiologic insomnia: Secondary | ICD-10-CM | POA: Diagnosis not present

## 2021-09-06 DIAGNOSIS — K760 Fatty (change of) liver, not elsewhere classified: Secondary | ICD-10-CM | POA: Diagnosis not present

## 2021-09-06 DIAGNOSIS — M85852 Other specified disorders of bone density and structure, left thigh: Secondary | ICD-10-CM | POA: Diagnosis not present

## 2021-09-06 DIAGNOSIS — M85851 Other specified disorders of bone density and structure, right thigh: Secondary | ICD-10-CM | POA: Diagnosis not present

## 2021-09-06 LAB — BASIC METABOLIC PANEL
BUN: 17 mg/dL (ref 6–23)
CO2: 28 mEq/L (ref 19–32)
Calcium: 10.5 mg/dL (ref 8.4–10.5)
Chloride: 102 mEq/L (ref 96–112)
Creatinine, Ser: 1.05 mg/dL (ref 0.40–1.20)
GFR: 52.65 mL/min — ABNORMAL LOW (ref >60.00)
Glucose, Bld: 95 mg/dL (ref 70–99)
Potassium: 4.6 mEq/L (ref 3.5–5.1)
Sodium: 137 mEq/L (ref 135–145)

## 2021-09-06 MED ORDER — AMOXICILLIN-POT CLAVULANATE 875-125 MG PO TABS: 1.0000 | ORAL_TABLET | Freq: Two times a day (BID) | ORAL | 0 refills | Status: DC

## 2021-09-06 MED ORDER — LINACLOTIDE 290 MCG PO CAPS: ORAL_CAPSULE | ORAL | 3 refills | Status: DC

## 2021-09-06 MED ORDER — ESOMEPRAZOLE MAGNESIUM 40 MG PO CPDR
DELAYED_RELEASE_CAPSULE | ORAL | 3 refills | Status: DC
Start: 2021-09-06 — End: 2021-09-06

## 2021-09-06 MED ORDER — CILIDINIUM-CHLORDIAZEPOXIDE 2.5-5 MG PO CAPS: 1.0000 | ORAL_CAPSULE | Freq: Two times a day (BID) | ORAL | 3 refills | Status: DC

## 2021-09-06 MED ORDER — ESOMEPRAZOLE MAGNESIUM 40 MG PO CPDR: DELAYED_RELEASE_CAPSULE | ORAL | 3 refills | Status: DC

## 2021-09-06 NOTE — Patient Instructions (Signed)
If you are age 74 or older, your body mass index should be between 23-30. Your Body mass index is 35.71 kg/m?Marland Kitchen If this is out of the aforementioned range listed, please consider follow up with your Primary Care Provider. ? ?If you are age 66 or younger, your body mass index should be between 19-25. Your Body mass index is 35.71 kg/m?Marland Kitchen If this is out of the aformentioned range listed, please consider follow up with your Primary Care Provider.  ? ?________________________________________________________ ? ?The [place] [facility] providers would like to encourage you to use [facility] to communicate with providers for non-urgent requests or questions.  Due to long hold times on the telephone, sending your provider a message by [facility] may be a faster and more efficient way to get a response.  Please allow 48 business hours for a response.  Please remember that this is for non-urgent requests.  ?_______________________________________________________ ? ?Your provider has requested that you go to the basement level for lab work before leaving today. Press "B" on the elevator. The lab is located at the first door on the left as you exit the elevator. ? ?We have sent the following medications to your pharmacy for you to pick up at your convenience:  Linzess, Librax, Nexium. ? ?You have been scheduled for a CT scan of the abdomen and pelvis at [facility], 1st floor Radiology. You are scheduled on 09/18/2021  at 11:00am. You should arrive 15 minutes prior to your appointment time for registration.  Please pick up 2 bottles of contrast from [facility] at least 3 days prior to your scan. The solution may taste better if refrigerated, but do NOT add ice or any other liquid to this solution. Shake well before drinking.  ? ?Please follow the written instructions below on the day of your exam:  ? ?1) Do not eat anything after 7:00am (4 hours prior to your test)  ? ?2) Drink 1 bottle of contrast @ 9:00am (2 hours prior to your exam)   Remember to shake well before drinking and do NOT pour over ice. ?    Drink 1 bottle of contrast @ 10:00am (1 hour prior to your exam)  ? ?You may take any medications as prescribed with a small amount of water, if necessary. If you take any of the following medications: METFORMIN, GLUCOPHAGE, GLUCOVANCE, AVANDAMET, RIOMET, FORTAMET, ACTOPLUS MET, JANUMET, [place] or METAGLIP, you MAY be asked to HOLD this medication 48 hours AFTER the exam.  ? ?The purpose of you drinking the oral contrast is to aid in the visualization of your intestinal tract. The contrast solution may cause some diarrhea. Depending on your individual set of symptoms, you may also receive an intravenous injection of x-ray contrast/dye. Plan on being at [facility] for 45 minutes or longer, depending on the type of exam you are having performed.  ? ?If you have any questions regarding your exam or if you need to reschedule, you may call [facility] at [phone] between the hours of 8:00 am and 5:00 pm, Monday-Friday.  ? ? ? ?

## 2021-09-06 NOTE — Progress Notes (Signed)
HISTORY OF PRESENT ILLNESS: ? ?Jamie [name] is a 76 y.o. female with past medical history as listed below who is followed in this office for GERD, fatty liver, constipation predominant irritable bowel syndrome, diverticulosis complicated by diverticulitis, and GERD.  She was last seen in this office September 26, 2020.  At that time her GERD was controlled on twice daily PPI, constipation predominant IBS controlled on a combination of Linzess and Librax, and a recent bout of diverticulitis had resolved.  Her last complete colonoscopy was performed June 2021.  Follow-up in 5 years recommended. ? ?She presents today for routine follow-up and management of her chronic GI conditions.  Also, she contacted the office July 13, 2021 complaining of what appeared to be a diverticular flare. ?I recommend the following: ? ?1.  Augmentin 875 mg twice daily for 10 days; #20; no refills.  Please prescribe ?2.  Low residue diet.  Advance as tolerated when feeling better ?3.  Exercise Tylenol as needed for pain ?4.  Routine office follow-up with advanced practitioner or myself in 2 weeks ?5.  In the interim, if symptoms were to get severe and unmanageable, proceed to ER   ? ?She tells me that within a few days her pain resolved.  She did well until 4 days ago when she developed recurrent lower abdominal pain in the suprapubic region.  It is now migrated to the left lower quadrant.  Consistent with her bouts of diverticulitis.  No fevers.  She does have increased discomfort with defecation.  She is moving her bowels with Linzess.  Her GERD is controlled with Nexium.  She request a refill of her GI medications. ? ? ?REVIEW OF SYSTEMS: ? ?All non-GI ROS negative unless otherwise stated in the HPI except for lower back pain, sinus and allergy, fatigue, insomnia ? ?Past Medical History:  ?Diagnosis Date  ? Allergic rhinitis   ? Anxiety   ? Arthritis   ? left foot  ? Asthma   ? Back pain   ? Blood transfusion without reported diagnosis    ? Colon polyps   ? adenomatous  ? Constipation   ? Diverticulosis   ? Dysrhythmia 1999  ? Dr Johnsie Cancel 2003  ? Elevated LFTs   ? Fatty liver disease, nonalcoholic   ? GERD (gastroesophageal reflux disease)   ? Headache(784.0)   ? migraines with vision changes but no pain  ? Heart murmur   ? Hypertension   ? IBS (irritable bowel syndrome)   ? Joint pain   ? Migraines   ? Muscle cramps   ? Neuromuscular disorder ([place])   ? RLS  ? Obesity   ? Palpitations   ? Pneumonia 2007  ? Renal cell carcinoma ([place]) 2013  ? minimal partial nephrectomy  ? Restless leg syndrome   ? Sleep apnea   ? moderate; wears CPAP, uncertain about setting  ? TIA (transient ischemic attack) 09/11/2014  ? Ulcer   ? Urinary incontinence   ? Vitamin D deficiency   ? ? ?Past Surgical History:  ?Procedure Laterality Date  ? BREAST REDUCTION SURGERY  Jan 1991  ? ELBOW SURGERY Left   ? KIDNEY SURGERY Right October 2013   ? Tumor removed   ? LAPAROSCOPIC APPENDECTOMY N/A 06/08/2018  ? Procedure: APPENDECTOMY LAPAROSCOPIC;  Surgeon: Jovita Kussmaul, MD;  Location: [facility];  Service: General;  Laterality: N/A;  ? TONSILLECTOMY    ? VAGINAL HYSTERECTOMY  1979  ? partial  ? ? ?Social History ?Jamie [name]  reports that she has never smoked. She has never used smokeless tobacco. She reports current alcohol use of about 2.0 - 3.0 standard drinks per week. She reports that she does not use drugs. ? ?family history includes Allergies in her brother and daughter; Anxiety disorder in her mother; Colon cancer in her cousin; Heart disease in her brother; Heart disease (age of onset: 38) in her father; Hypertension in her mother and another family member; Lung cancer in her father and sister; Melanoma in her daughter. ? ?Allergies  ?Allergen Reactions  ? Buprenorphine Hcl Rash and Shortness Of Breath  ? Morphine And Related Shortness Of Breath and Rash  ? Morphine And Related Shortness Of Breath  ? Aspirin Other (See Comments)  ?  REACTION: ulcers ? ?Can take 81 mg  ?  Avelox [Moxifloxacin Hcl In Nacl] Hives  ? Diflucan [Fluconazole] Hives  ?  Whelps and rash c skin peeling  ? Diflucan [Fluconazole] Hives  ? Hydrochlorothiazide Other (See Comments)  ? Latex Rash  ? Moxifloxacin Palpitations  ?  Tachycardia ?  ? ? ?  ? ?PHYSICAL EXAMINATION: ?Vital signs: BP 122/84   Pulse 76   Ht 5' 2"  (1.575 m)   Wt 195 lb 4 oz (88.6 kg)   BMI 35.71 kg/m?   ?Constitutional: generally well-appearing, no acute distress ?Psychiatric: alert and oriented x3, cooperative ?Eyes: extraocular movements intact, anicteric, conjunctiva pink ?Mouth: oral pharynx moist, no lesions ?Neck: supple no lymphadenopathy ?Cardiovascular: heart regular rate and rhythm, no murmur ?Lungs: clear to auscultation bilaterally ?Abdomen: soft, moderate tenderness in left lower quadrant without guarding, nondistended, no obvious ascites, no peritoneal signs, normal bowel sounds, no organomegaly ?Rectal: Omitted ?Extremities: no clubbing, cyanosis, or lower extremity edema bilaterally ?Skin: no lesions on visible extremities ?Neuro: No focal deficits.  Cranial nerves intact ? ?ASSESSMENT: ? ?1.  Acute diverticulitis.  Treated for flare in early January.  Appears to have recurrent disease at this time.  Moderate tenderness on exam. ?2.  History of multiple adenomatous colon polyps.  Last colonoscopy June 2021 with adenomatous polyp and sigmoid diverticulosis. ?3.  GERD.  Symptoms controlled with twice daily Nexium.  This is the lowest effective dose for this patient. ?4.  Constipation predominant IBS.  Being treated with Linzess and as needed Librax ? ? ?PLAN: ? ?1.  Low residue diet to be advanced as tolerated ?2.  Prescribed Augmentin 875 mg twice daily for 10 days. ?3.  Contrast-enhanced CT scan of the abdomen and pelvis.  Rule out complicated diverticulitis.  We will contact the patient with the results ?4.  Reflux precautions ?5.  Refill Nexium 40 mg twice daily.  Medication risks reviewed ?6.  Refill Librax.  Medication  risks reviewed ?7.  Refill Linzess 290 mcg daily.  Medication risks reviewed ?8.  Routine office follow-up 1 year.  Sooner if needed ?A total time of 30 minutes was spent preparing to see the patient, obtaining comprehensive history, performing comprehensive physical examination, ordering medications, ordering advanced radiology, counseling the patient and educating the patient regarding the above listed issues, and documenting clinical information in the health record ? ? ? ?  ?

## 2021-09-13 ENCOUNTER — Ambulatory Visit: Payer: Medicare Other | Admitting: Nurse Practitioner

## 2021-09-18 ENCOUNTER — Ambulatory Visit (INDEPENDENT_AMBULATORY_CARE_PROVIDER_SITE_OTHER): Payer: Medicare Other | Admitting: Internal Medicine

## 2021-09-18 ENCOUNTER — Telehealth: Payer: Self-pay

## 2021-09-18 ENCOUNTER — Other Ambulatory Visit: Payer: Self-pay

## 2021-09-18 ENCOUNTER — Encounter: Payer: Self-pay | Admitting: Internal Medicine

## 2021-09-18 ENCOUNTER — Ambulatory Visit (HOSPITAL_COMMUNITY): Admission: RE | Admit: 2021-09-18 | Payer: Medicare Other | Source: Ambulatory Visit

## 2021-09-18 ENCOUNTER — Encounter (HOSPITAL_COMMUNITY): Payer: Self-pay

## 2021-09-18 VITALS — BP 126/70 | HR 75 | Temp 98.7°F | Ht 62.0 in | Wt 195.0 lb

## 2021-09-18 DIAGNOSIS — G4700 Insomnia, unspecified: Secondary | ICD-10-CM | POA: Diagnosis not present

## 2021-09-18 DIAGNOSIS — E8881 Metabolic syndrome: Secondary | ICD-10-CM

## 2021-09-18 DIAGNOSIS — R7303 Prediabetes: Secondary | ICD-10-CM

## 2021-09-18 DIAGNOSIS — K760 Fatty (change of) liver, not elsewhere classified: Secondary | ICD-10-CM | POA: Diagnosis not present

## 2021-09-18 DIAGNOSIS — G43009 Migraine without aura, not intractable, without status migrainosus: Secondary | ICD-10-CM | POA: Diagnosis not present

## 2021-09-18 DIAGNOSIS — R252 Cramp and spasm: Secondary | ICD-10-CM | POA: Diagnosis not present

## 2021-09-18 DIAGNOSIS — J984 Other disorders of lung: Secondary | ICD-10-CM

## 2021-09-18 DIAGNOSIS — K589 Irritable bowel syndrome without diarrhea: Secondary | ICD-10-CM

## 2021-09-18 DIAGNOSIS — I10 Essential (primary) hypertension: Secondary | ICD-10-CM

## 2021-09-18 DIAGNOSIS — G2581 Restless legs syndrome: Secondary | ICD-10-CM

## 2021-09-18 MED ORDER — METAXALONE 400 MG PO TABS: 400.0000 mg | ORAL_TABLET | Freq: Every day | ORAL | 0 refills | Status: DC

## 2021-09-18 MED ORDER — METFORMIN HCL 500 MG PO TABS: 500.0000 mg | ORAL_TABLET | Freq: Every day | ORAL | 3 refills | Status: DC

## 2021-09-18 MED ORDER — METAXALONE 400 MG PO TABS: 400.0000 mg | ORAL_TABLET | Freq: Every day | ORAL | 1 refills | Status: DC

## 2021-09-18 MED ORDER — PRAMIPEXOLE DIHYDROCHLORIDE 0.25 MG PO TABS: 0.2500 mg | ORAL_TABLET | Freq: Every day | ORAL | 3 refills | Status: DC

## 2021-09-18 MED ORDER — PREMARIN 0.625 MG/GM VA CREA: 1.0000 | TOPICAL_CREAM | Freq: Every day | VAGINAL | 12 refills | Status: DC

## 2021-09-18 MED ORDER — TRAZODONE HCL 50 MG PO TABS: 50.0000 mg | ORAL_TABLET | Freq: Every day | ORAL | 3 refills | Status: DC

## 2021-09-18 NOTE — Patient Instructions (Addendum)
We will get the records from your last doctor to see if any are needed. ? ?We have refilled the skelaxin and the metformin. ? ?We can try mirapex (pramipexole) for the restless leg syndrome. Take 1 pill at night time and after 1-2 weeks let us know how this is doing. ? ?We have sent in trazodone for sleep to take 1 pill at night time about 30 minutes prior to bedtime. Give this 1 week to work and then if still not sleeping well it is safe to increase to 2 pills at night time and check in with [name] in 1-2 weeks. ?

## 2021-09-18 NOTE — Progress Notes (Signed)
? ?  Subjective:  ? ?Patient ID: Jamie [name], female    DOB: March 23, 1946, 76 y.o.   MRN: [id number] ? ?HPI ?The patient is a new 76 YO female coming in for concerns. ? ?PMH, [facility], social history reviewed and updated ? ?Review of Systems  ?Constitutional:  Positive for activity change.  ?HENT: Negative.    ?Eyes: Negative.   ?Respiratory:  Negative for cough, chest tightness and shortness of breath.   ?Cardiovascular:  Negative for chest pain, palpitations and leg swelling.  ?Gastrointestinal:  Negative for abdominal distention, abdominal pain, constipation, diarrhea, nausea and vomiting.  ?Musculoskeletal:  Positive for myalgias.  ?Skin: Negative.   ?Neurological: Negative.   ?     Cramps, RLS  ?Psychiatric/Behavioral:  Positive for decreased concentration and sleep disturbance.   ? ?Objective:  ?Physical Exam ?Constitutional:   ?   Appearance: She is well-developed.  ?HENT:  ?   Head: Normocephalic and atraumatic.  ?Cardiovascular:  ?   Rate and Rhythm: Normal rate and regular rhythm.  ?Pulmonary:  ?   Effort: Pulmonary effort is normal. No respiratory distress.  ?   Breath sounds: Normal breath sounds. No wheezing or rales.  ?Abdominal:  ?   General: Bowel sounds are normal. There is no distension.  ?   Palpations: Abdomen is soft.  ?   Tenderness: There is no abdominal tenderness. There is no rebound.  ?Musculoskeletal:     ?   General: Tenderness present.  ?   Cervical back: Normal range of motion.  ?Skin: ?   General: Skin is warm and dry.  ?Neurological:  ?   Mental Status: She is alert and oriented to person, place, and time.  ?   Coordination: Coordination normal.  ? ? ?Vitals:  ? 09/18/21 1404  ?BP: 126/70  ?Pulse: 75  ?Temp: 98.7 ?F (37.1 ?C)  ?TempSrc: Oral  ?SpO2: 97%  ?Weight: 195 lb (88.5 kg)  ?Height: 5' 2"  (1.575 m)  ? ? ?This visit occurred during the SARS-CoV-2 public health emergency.  Safety protocols were in place, including screening questions prior to the visit, additional usage of staff PPE,  and extensive cleaning of exam room while observing appropriate contact time as indicated for disinfecting solutions.  ? ?Assessment & Plan:  ? ?

## 2021-09-18 NOTE — Telephone Encounter (Signed)
Pt missed her ct scan. Left message for pt to call back regarding rescheduling scan. ? ?Pt given the phone number [phone] to reschedule her CT scan. Scan has been rescheduled. ?

## 2021-09-18 NOTE — Progress Notes (Unsigned)
Fax sent to [facility] (Dr. Deland Pretty) to request medical records at [phone]. Received confirmation fax. ?

## 2021-09-19 ENCOUNTER — Telehealth: Payer: Self-pay | Admitting: Internal Medicine

## 2021-09-19 NOTE — Telephone Encounter (Signed)
Patient calling in ? ?Patient says provider started her on 2 new meds at yesterdays OV: ? ?pramipexole (MIRAPEX) 0.25 MG tablet ? ?traZODone (DESYREL) 50 MG tablet ? ? ?Patient says she believes she is having some side effects that may be related to these new meds.. says she does not have any control over her bladder & says she feels like she has been "drugged".. says she did not sleep well last night & today she is extremely tired & sluggish ? ?Would like cb from nurse [phone] ?

## 2021-09-20 ENCOUNTER — Ambulatory Visit: Payer: Medicare Other | Admitting: Internal Medicine

## 2021-09-20 NOTE — Telephone Encounter (Signed)
Ok to hold both today, but restart the mirapex only tomorrow and over the weekend, and consder starting the trazodone on next Monday to see which may have caused the problem, o/w may need ROV to discuss change of medicaiton ?

## 2021-09-20 NOTE — Telephone Encounter (Signed)
Left message for patient to call me back. 

## 2021-09-21 DIAGNOSIS — G4700 Insomnia, unspecified: Secondary | ICD-10-CM | POA: Insufficient documentation

## 2021-09-21 NOTE — Assessment & Plan Note (Signed)
Advised that clonazepam is not really an accepted treatment for daily RLS symptoms any longer and that I will not prescribe as she is already on concurrent librax BID. I do not prescribe multiple benzodiazepenes due to safety concerns. Explained that we can try mirapex which is prescribed.  ?

## 2021-09-21 NOTE — Telephone Encounter (Signed)
Patient notified of provider's recommendations and states that she has already tried doing this and discovered that it was indeed the trazadone causing the issues.  ? ?Patient states that she will stop taking the Librax if provider will prescribe back the clonazepam. ?

## 2021-09-21 NOTE — Assessment & Plan Note (Addendum)
Causes some chronic SOB overall stable does not need any inhalers.  ?

## 2021-09-21 NOTE — Assessment & Plan Note (Signed)
She is using skelaxin for cramps which her prior provider stopped. We will rx for her today skelaxin 400 mg qhs as this helps with symptoms.  ?

## 2021-09-21 NOTE — Assessment & Plan Note (Signed)
Taking spironolactone 50 mg daily and BP at goal. Checking records as she states recent labs.  ?

## 2021-09-21 NOTE — Assessment & Plan Note (Signed)
She had been taking clonazepam 1 mg qhs for sleep but her prior provider was weaning her off this and had stopped it. They had prescribed lunesta for sleep for her which did not work. I have informed her that since she is already taking a benzodiazepene I will not prescribe clonazepam for sleep for her. I have recommended we can try mirapex for RLS and then see if this helps with sleep also or try an additional sleep aid along with mirapex which she elects to do. Rx trazodone 50 mg to start with 50 mg daily for sleep. Can increase after 1 week to 100 mg daily for sleep.  ?

## 2021-09-21 NOTE — Assessment & Plan Note (Signed)
Needs at least yearly monitoring of labs. She states recent labs getting records.  ?

## 2021-09-21 NOTE — Assessment & Plan Note (Signed)
Taking linzess for constipation and librax BID for unclear reasons. Sees GI yearly and their most recent note does not comment on the librax at all. She does have cramping type pain but I do not see if she has been tried on bentyl or hyoscyamine in the past.  ?

## 2021-09-21 NOTE — Telephone Encounter (Signed)
I would recommend to continue mirapex and we will adjust as needed for restless leg.  ?

## 2021-09-21 NOTE — Assessment & Plan Note (Signed)
Uses nurtec as needed and rare migraines.  ?

## 2021-09-21 NOTE — Assessment & Plan Note (Signed)
BMI 35 but complicated by pre-diabetes and hypertension. Encouraged to work on diet/exercise.  ?

## 2021-09-21 NOTE — Assessment & Plan Note (Signed)
Suspected diagnosis. She is taking metformin 500 mg daily. She states recent labs we will obtain those records and adjust as needed.  ?

## 2021-09-23 ENCOUNTER — Ambulatory Visit (HOSPITAL_COMMUNITY)
Admission: RE | Admit: 2021-09-23 | Discharge: 2021-09-23 | Disposition: A | Discharge status: Home / Self Care | Payer: Medicare Other | Source: Ambulatory Visit | Attending: Internal Medicine | Admitting: Internal Medicine

## 2021-09-23 ENCOUNTER — Other Ambulatory Visit: Payer: Self-pay

## 2021-09-23 DIAGNOSIS — Z86010 Personal history of colonic polyps: Secondary | ICD-10-CM | POA: Insufficient documentation

## 2021-09-23 DIAGNOSIS — K5901 Slow transit constipation: Secondary | ICD-10-CM | POA: Insufficient documentation

## 2021-09-23 DIAGNOSIS — R1030 Lower abdominal pain, unspecified: Secondary | ICD-10-CM | POA: Diagnosis not present

## 2021-09-23 DIAGNOSIS — K219 Gastro-esophageal reflux disease without esophagitis: Secondary | ICD-10-CM | POA: Insufficient documentation

## 2021-09-23 DIAGNOSIS — K5730 Diverticulosis of large intestine without perforation or abscess without bleeding: Secondary | ICD-10-CM | POA: Diagnosis not present

## 2021-09-23 DIAGNOSIS — K5792 Diverticulitis of intestine, part unspecified, without perforation or abscess without bleeding: Secondary | ICD-10-CM | POA: Diagnosis not present

## 2021-09-23 DIAGNOSIS — K760 Fatty (change of) liver, not elsewhere classified: Secondary | ICD-10-CM | POA: Insufficient documentation

## 2021-09-23 DIAGNOSIS — K589 Irritable bowel syndrome without diarrhea: Secondary | ICD-10-CM | POA: Insufficient documentation

## 2021-09-23 MED ORDER — IOHEXOL 300 MG/ML  SOLN
100.0000 mL | Freq: Once | INTRAMUSCULAR | Status: AC | PRN
  Administered 2021-09-23: 100 mL via INTRAVENOUS

## 2021-09-24 DIAGNOSIS — M5416 Radiculopathy, lumbar region: Secondary | ICD-10-CM | POA: Diagnosis not present

## 2021-09-24 DIAGNOSIS — M7062 Trochanteric bursitis, left hip: Secondary | ICD-10-CM | POA: Diagnosis not present

## 2021-10-02 ENCOUNTER — Ambulatory Visit (INDEPENDENT_AMBULATORY_CARE_PROVIDER_SITE_OTHER): Payer: Medicare Other | Admitting: Neurology

## 2021-10-02 ENCOUNTER — Encounter: Payer: Self-pay | Admitting: Neurology

## 2021-10-02 ENCOUNTER — Other Ambulatory Visit: Payer: Self-pay | Admitting: Neurology

## 2021-10-02 VITALS — BP 139/83 | HR 96 | Ht 62.0 in | Wt 194.0 lb

## 2021-10-02 DIAGNOSIS — G2581 Restless legs syndrome: Secondary | ICD-10-CM | POA: Insufficient documentation

## 2021-10-02 DIAGNOSIS — G629 Polyneuropathy, unspecified: Secondary | ICD-10-CM

## 2021-10-02 DIAGNOSIS — Z9989 Dependence on other enabling machines and devices: Secondary | ICD-10-CM

## 2021-10-02 DIAGNOSIS — R7302 Impaired glucose tolerance (oral): Secondary | ICD-10-CM | POA: Diagnosis not present

## 2021-10-02 DIAGNOSIS — G4733 Obstructive sleep apnea (adult) (pediatric): Secondary | ICD-10-CM | POA: Diagnosis not present

## 2021-10-02 MED ORDER — CLONAZEPAM 0.5 MG PO TABS: ORAL_TABLET | ORAL | 0 refills | Status: DC

## 2021-10-02 MED ORDER — CLONAZEPAM 0.5 MG PO TABS
ORAL_TABLET | ORAL | 0 refills | Status: DC
Start: 2021-10-02 — End: 2021-10-02

## 2021-10-02 NOTE — Progress Notes (Addendum)
? ? ?SLEEP MEDICINE CLINIC: ? ?PATIENT: Jamie [name] ?DOB: [birth year] ? ?REASON FOR VISIT:  established patient for OSA on CPAP, today referred by Dr. Shelia Media to take over RLS management.  ?HISTORY FROM: patient here alone. ? ? ?Dr Deland Pretty, MD  ? ?Chief Complaint  ?Patient presents with  ? New Patient (Initial Visit)  ?  RM 10, alone. Paper referral for RLS.   ? ?  ? ?HISTORY OF PRESENT ILLNESS: This female 76 year-old  patient is now referred for a problem of RLS, was for year treated by PCP with skelaxin and klonopin. She reports her PCP refuses to reorder these medications and she has not found any other effective medication for this problem, staying awake due to RLS . ?She is established as an apnea patient in the [facility] and followed yearly with OSA on CPAP.  ?Patient presents today for RLS and  irresistible urge to move in both legs- rarely having bilateral leg pain. She said that she woke with cramps in her legs in December. Left leg pain comes form spinal stenosis, has had epidural injections which allowed temporary relief.  ?She had a difficult time getting her cramps to resolve. She said that both legs were very sore afterwards. Since the cramps, she states that her legs ache. She has a history of restless leg syndrome. No lower back or groin pain.   ?Has been diagnosed with restless leg syndrome in the past by her primary care physician and treated on Klonopin and Skelaxin which did provide some help and allows her to go to sleep-.  She is not had any injury or trauma.  Does not really have any numbness or tingling just creepy crawly.  Has no history of diabetes.  Does not have fibromyalgia.  Not on a statin drug. ? ?Takes benadryl , which can exacerbate  RLS.   ?She recently has been seeing a new PCP Dr. Sharlet Salina who changed medications_ Cannot tolerate trazodone and reports RLS worse on Mirapex.- both were  ordered by Dr .Sharlet Salina 09-18-2021. Induced twitching.  ? ?Certainly not an easy  case.  ?Check iron level and HBaic.   ?High calcium- parathyroid .  ? ? ? ? ?02/08/2021 ALL: ?[name] returns for follow up for OSA on CPAP. She is doing very well. She uses CPAP every night for about 10 hours a night. She denies concerns with machine or supplies. She does hear some whistling from time to time. She uses a small full face mask. She recently took Pavloxid for Covid 19. She recovered well and denies lingering symptoms.  ? ? ? ? ?02/09/2020 ALL:  ?Jamie [name] is a 76 y.o. female here today for follow up for OSA on CPAP and headaches. Nurtec was started in June for abortive therapy.  She reports that headaches are well managed.  Nurtec has worked well.  She continues CPAP therapy.  She did receive her new machine last year, however, she reports that it makes an unusual whistling sound.  She has not used her new CPAP and continues to use her old machine.  Unfortunately, we do not have a compliance report to review for today's visit.  She states that she is using CPAP every night.  She denies any concerns with her old machine with the exception of having to restart CPAP from time to time.  She notes significant benefit of using CPAP therapy. ? ?HISTORY: (copied from my note on 02/09/2019) ? ?Jamie [name] is a 76  y.o. female here today for follow up for OSA on CPAP. She is doing well with compliance. She is concerned that her machine is not working correctly. She reports that when she tries to power up the machine, it may take 2-3 tries.  She is concerned that eventually machine will completely give out.  Last sleep study was in 2012.  She feels that her CPAP is approximately that old.  She is using CPAP nightly.  She reports that she cannot sleep without it.   ?  ?HISTORY: (copied from [employer] note on 01/19/2018) ?76 y.o. year old female  has a past medical history of Allergic rhinitis, Anxiety, Arthritis, Asthma, Back pain, Blood transfusion without reported diagnosis, Colon polyps,  Constipation, Diverticulosis, Dysrhythmia (1999), Elevated LFTs, Fatty liver disease, nonalcoholic, GERD (gastroesophageal reflux disease), Headache(784.0), Heart murmur, Hypertension, IBS (irritable bowel syndrome), Joint pain, Migraines, Muscle cramps, Neuromuscular disorder ([place]), Obesity, Palpitations, Pneumonia (2007), Renal cell carcinoma ([place]) (2013), Restless leg syndrome, Sleep apnea, TIA (transient ischemic attack) (09/11/2014), Ulcer, Urinary incontinence, and Vitamin D deficiency.  ?  ?(CD)Is and is here today to be evaluated for a headache, and she reports that she first had migrainous headaches when she resumed birth control pills over 40 years ago. The headaches were very debilitating at the time but there were infrequent through the 1970s. Once she discontinued hormonal contraception her headaches became as infrequent as once a year. In the early 1980s the couple lived in [place] and Jamie [name] recalls that the bright light was a trigger for headaches -there was a strong photophobic component, and she made many trips to the local hospital for Demerol shots. She underwent a self hypnosis treatment and this worked well, controlled the migraine for years. ?On 9-20 02-2013 the patient woke up with a severe explosive kind of pain that seemed to arise from the right eye. The pain was centered retro-orbital and radiated backwards into the center of the head. The patient tried aspirin and Motrin with no benefit at the time as my colleague Dr. Janann Colonel D.O. did document.  ?This was different from her typical migraines. She was extremely nauseated but she could not vomit and developed dry heaves. The headache broker out of sound sleep at about about 3 AM. She had no tearing of the eye ( that she could remember),  the nose was not runny and her face was not flushe.She was asleep with her CPAP.   ?The patient had an eye exam in 2013 which was reportedly normal at that time and her headaches were evaluated with a  brain MRI which was also reviewed and found unremarkable. Not had a number of the severe headache spell since. ?On 09-12-14 the patient again developed a headache this time of a new quality. She was watching TV at about 11 PM she noticed that the right vision was fluctuating she states that her vision was disturbed by changing in light and dark sensation, that she describedas if it checker board was placed in front of her eyes. By 11.45 , she tried to answer an e mail, couldn't decipher the message, non-sensical to her. She switched and tried to read aloud, she couldn't , her speech output was not correlated to the text she read. Jibberish, while her normal speech was unaffected. On the left side this time by migraines have always attack the right side of her head. This was unusual as well. ?The entire episode lasted less than one hour. The emergency room obtained a CT head ,  non contrast, read as normal and reviewed here in imaging file. Her BP was very high.   ?She sleeps with a CPAP, and likes it very much, she had nocturia before being diagnosed with OSA.   ?Her pneumonia related hospitalization in 2007 was the cause for the test by [facility], as her RN had witnessed her sleep apnea. Dr .Elsworth Soho follows her, Dr Constance Holster is her ENT.  ?  ? Interval history from 03/27/2017CD. Jamie [name] is doing very well she has had one of the severe headaches she used to describe in the past, -onset at about 5:30 after working on the computer. ?She developed a severe right-sided headache. Afraid this could be another TIA I  have recommended her to go to the emergency room, she was given morphine for the headaches which then caused another reaction and side effects. She did not have any speech arrest with this one. The emergency room did not find anything abnormal, she underwent a CT scan of the brain, which was normal, MRI brain  was normal. I'm also able today to just review a download of her CPAP machine which has been followed by Dr.  Shelia Media but not in the last 3 years. She has 100% compliance for days 100% compliance for over 4 hours of daily use, average user time is 9 hours 7 minutes set pressure is 12 cm water no EPR is used residual AHI is 0.8.

## 2021-10-02 NOTE — Patient Instructions (Signed)
Restless Legs Syndrome ?Restless legs syndrome is a condition that causes uncomfortable feelings or sensations in the legs, especially while sitting or lying down. The sensations usually cause an overwhelming urge to move the legs. The arms can also sometimes be affected. ?The condition can range from mild to severe. The symptoms often interfere with a person's ability to sleep. ?What are the causes? ?The cause of this condition is not known. ?What increases the risk? ?The following factors may make you more likely to develop this condition: ?Being older than 50. ?Pregnancy. ?Being a woman. In general, the condition is more common in women than in men. ?A family history of the condition. ?Having iron deficiency. ?Overuse of caffeine, nicotine, or alcohol. ?Certain medical conditions, such as kidney disease, Parkinson's disease, or nerve damage. ?Certain medicines, such as those for high blood pressure, nausea, colds, allergies, depression, and some heart conditions. ?What are the signs or symptoms? ?The main symptom of this condition is uncomfortable sensations in the legs, such as: ?Pulling. ?Tingling. ?Prickling. ?Throbbing. ?Crawling. ?Burning. ?Usually, the sensations: ?Affect both sides of the body. ?Are worse when you sit or lie down. ?Are worse at night. These may make it difficult to fall asleep. ?Make you have a strong urge to move your legs. ?Are temporarily relieved by moving your legs or standing. ?The arms can also be affected, but this is rare. People who have this condition often have tiredness during the day because of their lack of sleep at night. ?How is this diagnosed? ?This condition may be diagnosed based on: ?Your symptoms. ?Blood tests. ?In some cases, you may be monitored in a sleep lab by a specialist (a sleep study). This can detect any disruptions in your sleep. ?How is this treated? ?This condition is treated by managing the symptoms. This may include: ?Lifestyle changes, such as  exercising, using relaxation techniques, and avoiding caffeine, alcohol, or tobacco. ?Iron supplements. ?Medicines. Parkinson's medications may be tried first. Anti-seizure medications can also be helpful. ?Follow these instructions at home: ?General instructions ?Take over-the-counter and prescription medicines only as told by your health care provider. ?Use methods to help relieve the uncomfortable sensations, such as: ?Massaging your legs. ?Walking or stretching. ?Taking a cold or hot bath. ?Keep all follow-up visits. This is important. ?Lifestyle ?  ?Practice good sleep habits. For example, go to bed and get up at the same time every day. Most adults should get 7-9 hours of sleep each night. ?Exercise regularly. Try to get at least 30 minutes of exercise most days of the week. ?Practice ways of relaxing, such as yoga or meditation. ?Avoid caffeine and alcohol. ?Do not use any products that contain nicotine or tobacco. These products include cigarettes, chewing tobacco, and vaping devices, such as e-cigarettes. If you need help quitting, ask your health care provider. ?Where to find more information ?[employer] of Neurological Disorders and Stroke: MasterBoxes.it ?Contact a health care provider if: ?Your symptoms get worse or they do not improve with treatment. ?Summary ?Restless legs syndrome is a condition that causes uncomfortable feelings or sensations in the legs, especially while sitting or lying down. ?The symptoms often interfere with your ability to sleep. ?This condition is treated by managing the symptoms. You may need to make lifestyle changes or take medicines. ?This information is not intended to replace advice given to you by your health care provider. Make sure you discuss any questions you have with your health care provider. ?Document Revised: 02/04/2021 Document Reviewed: 02/04/2021 ?Elsevier Patient Education ? 2022  [employer]. ? ?

## 2021-10-03 NOTE — Progress Notes (Signed)
New order faxed to DME: [facility]  F: [phone]  ? ?Fax confirmation received  ?

## 2021-10-04 ENCOUNTER — Ambulatory Visit (INDEPENDENT_AMBULATORY_CARE_PROVIDER_SITE_OTHER): Payer: Medicare Other | Admitting: Family Medicine

## 2021-10-04 ENCOUNTER — Ambulatory Visit: Payer: Medicare Other

## 2021-10-04 ENCOUNTER — Ambulatory Visit (INDEPENDENT_AMBULATORY_CARE_PROVIDER_SITE_OTHER): Payer: Medicare Other

## 2021-10-04 ENCOUNTER — Telehealth: Payer: Self-pay | Admitting: Neurology

## 2021-10-04 VITALS — BP 124/76 | HR 79 | Ht 62.0 in | Wt 193.6 lb

## 2021-10-04 DIAGNOSIS — M25562 Pain in left knee: Secondary | ICD-10-CM

## 2021-10-04 DIAGNOSIS — G8929 Other chronic pain: Secondary | ICD-10-CM

## 2021-10-04 DIAGNOSIS — M5416 Radiculopathy, lumbar region: Secondary | ICD-10-CM | POA: Diagnosis not present

## 2021-10-04 DIAGNOSIS — M7062 Trochanteric bursitis, left hip: Secondary | ICD-10-CM | POA: Diagnosis not present

## 2021-10-04 NOTE — Patient Instructions (Signed)
Thank you for coming in today.  ? ?Please get an Xray today before you leave  ? ?You received an injection today. Seek immediate medical attention if the joint becomes red, extremely painful, or is oozing fluid.  ? ?Continue physical therapy ? ?Recheck back in 6 weeks ?

## 2021-10-04 NOTE — Progress Notes (Signed)
? ?I, Peterson Lombard, LAT, ATC acting as a scribe for Lynne Leader, MD. ? ?Jamie [name] is a 76 y.o. female who presents to [place] at [facility] today for f/u lumbar radiculopathy w/ L lateral hip and buttocks pain with pain radiating down the left leg in an L5 dermatomal pattern. 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 Dr. Durward Fortes performed a L GT steroid injection in March of 2022 w/ minimal relief.  Pt only got about 1 week of relief from prior ESI. Pt was last seen by Dr. Georgina Snell on 08/23/21 and was referred to PT at [facility]. Today, pt reports has seen PT, Nikki, once and has been working on [employer], but her L leg is still painful. Pt c/o pain waking her up at night. ? ?Dx imaging: 08/23/21 L-hip & L-spine XR ? 08/30/20 L-spine MRI ? 08/10/20 L-spine XR ? ?Pertinent review of systems: No fevers or chills ? ?Relevant historical information: Sleep apnea ? ? ?Exam:  ?BP 124/76   Pulse 79   Ht 5' 2"  (1.575 m)   Wt 193 lb 9.6 oz (87.8 kg)   SpO2 97%   BMI 35.41 kg/m?  ?General: Well Developed, well nourished, and in no acute distress.  ? ?MSK: L-spine: Normal appearing ?Nontender midline ?Decreased lumbar motion. ?Left hip mildly tender palpation greater trochanter. ?Normal hip motion. ? ?Left knee: Normal. ?Mildly tender palpation medial joint line. ?Normal knee motion. ? ? ? ?Lab and Radiology Results ? ?Procedure: Real-time Ultrasound Guided Injection of left knee superior lateral patellar space ?Device: Philips Affiniti 50G ?Images permanently stored and available for review in PACS ?Verbal informed consent obtained.  Discussed risks and benefits of procedure. Warned about infection bleeding damage to structures skin hypopigmentation and fat atrophy among others. ?Patient expresses understanding and agreement ?Time-out  conducted.   ?Noted no overlying erythema, induration, or other signs of local infection.   ?Skin prepped in a sterile fashion.   ?Local anesthesia: Topical Ethyl chloride.   ?With sterile technique and under real time ultrasound guidance: 40 mg of Kenalog and 2 mg of Marcaine injected into knee joint. Fluid seen entering the joint capsule.   ?Completed without difficulty   ?Pain immediately resolved suggesting accurate placement of the medication.   ?Advised to call if fevers/chills, erythema, induration, drainage, or persistent bleeding.   ?Images permanently stored and available for review in the ultrasound unit.  ?Impression: Technically successful ultrasound guided injection. ? ? ?X-ray images left knee obtained today personally and independently interpreted ?Mild medial compartment and patellofemoral DJD.  Mild to moderate lateral compartment DJD.  Trace chondrocalcinosis present. ?Await formal radiology review ? ? ? ? ? ?Assessment and Plan: ?76 y.o. female with multifactorial left leg pain thought to be due to greater trochanteric bursitis and hip abductor tendinopathy, possible L4-L5 radiculopathy and some knee pain cause. ? ?Plan to continue physical therapy.  Just getting started with physical therapy now.  Additionally proceed with knee injection.  Recheck back in 6 weeks. ? ? ?PDMP not reviewed this encounter. ?Orders Placed This Encounter  ?Procedures  ? [name] LIMITED JOINT SPACE STRUCTURES LOW LEFT(NO LINKED CHARGES)  ?  Order Specific Question:   Reason for Exam (SYMPTOM  OR DIAGNOSIS REQUIRED)  ?  Answer:   left knee pain  ?  Order Specific Question:   Preferred imaging  location?  ?  Answer:   [facility]  ? DG Knee AP/LAT W/[place] Left  ?  Standing Status:   Future  ?  Standing Expiration Date:   11/04/2021  ?  Order Specific Question:   Reason for Exam (SYMPTOM  OR DIAGNOSIS REQUIRED)  ?  Answer:   left knee pain  ?  Order Specific Question:   Preferred imaging location?  ?   Answer:   [facility]  ? ?No orders of the defined types were placed in this encounter. ? ? ? ?Discussed warning signs or symptoms. Please see discharge instructions. Patient expresses understanding. ? ? ?The above documentation has been reviewed and is accurate and complete Lynne Leader, M.D. ? ? ?

## 2021-10-04 NOTE — Telephone Encounter (Signed)
Called the patient back. Pt states that it was discussed that Dr Brett Fairy was starting her on this clonazepam 0.5 mg to take at bedtime and she was under the impression she wanted her to follow up in 30 days. I reviewed the notes and her recommendation is follow up in 6 mths. She does mention ordering a benzo (clonazepam).  ?The pt states that in the last couple nights she has felt that it is working. She is concerned that if she is going to continue the medication whether to make sure a script is sent in time to express script so that she gets it in time (when she is due)  ?I advised that in couple weeks, as long as the medication is working well and she is tolerating and Dr Dohmeier's plan is to continue this going forward we can then send to express script at that time.  ?

## 2021-10-04 NOTE — Telephone Encounter (Signed)
Pt asking if she need to schedule an appt 30 days from OV. Pt said Dr. Brett Fairy told her to come back in 30 days to see how do on clonazePAM (KLONOPIN) 0.5 MG tablet. Would like call from the nurse. ?

## 2021-10-05 NOTE — Telephone Encounter (Signed)
I can make room for a Friday visit, virtual or in person.RV  ?

## 2021-10-08 NOTE — Progress Notes (Signed)
Left knee x-ray shows mild arthritis changes

## 2021-10-09 DIAGNOSIS — M7062 Trochanteric bursitis, left hip: Secondary | ICD-10-CM | POA: Diagnosis not present

## 2021-10-09 DIAGNOSIS — M5416 Radiculopathy, lumbar region: Secondary | ICD-10-CM | POA: Diagnosis not present

## 2021-10-11 ENCOUNTER — Ambulatory Visit: Payer: Medicare Other | Admitting: Nurse Practitioner

## 2021-10-11 DIAGNOSIS — M5416 Radiculopathy, lumbar region: Secondary | ICD-10-CM | POA: Diagnosis not present

## 2021-10-11 DIAGNOSIS — M7062 Trochanteric bursitis, left hip: Secondary | ICD-10-CM | POA: Diagnosis not present

## 2021-10-15 MED ORDER — CLONAZEPAM 0.5 MG PO TABS
ORAL_TABLET | ORAL | 2 refills | Status: DC
Start: 2021-10-15 — End: 2021-10-23

## 2021-10-15 NOTE — Telephone Encounter (Signed)
Klonopin 0.5 mg for the next 30 days at night ( I am willing to do 2 refills- . Total of 90 days).  ?Trying lowest dose approach for RLS control.  ?

## 2021-10-15 NOTE — Telephone Encounter (Signed)
Dr. Brett Fairy, this is the pt we spoke about this morning. Asking to addend your last note to have plan for clonazepam moving forward. Pt has her 6 month f/u scheduled for September. ?

## 2021-10-15 NOTE — Addendum Note (Signed)
Addended by: Larey Seat on: 10/15/2021 05:57 PM ? ? Modules accepted: Orders ? ?

## 2021-10-16 DIAGNOSIS — M5416 Radiculopathy, lumbar region: Secondary | ICD-10-CM | POA: Diagnosis not present

## 2021-10-16 DIAGNOSIS — M7062 Trochanteric bursitis, left hip: Secondary | ICD-10-CM | POA: Diagnosis not present

## 2021-10-18 DIAGNOSIS — M5416 Radiculopathy, lumbar region: Secondary | ICD-10-CM | POA: Diagnosis not present

## 2021-10-18 DIAGNOSIS — M7062 Trochanteric bursitis, left hip: Secondary | ICD-10-CM | POA: Diagnosis not present

## 2021-10-23 ENCOUNTER — Other Ambulatory Visit: Payer: Self-pay | Admitting: Neurology

## 2021-10-23 DIAGNOSIS — M7062 Trochanteric bursitis, left hip: Secondary | ICD-10-CM | POA: Diagnosis not present

## 2021-10-23 DIAGNOSIS — M5416 Radiculopathy, lumbar region: Secondary | ICD-10-CM | POA: Diagnosis not present

## 2021-10-23 NOTE — Telephone Encounter (Signed)
Pt called wanting to let the provider know that everything is going well with the clonazePAM (KLONOPIN) 0.5 MG tablet and she would like to know if a Rx for the medication can be sent to the [facility] for a 90 day qt. ?Please advise. ?

## 2021-10-23 NOTE — Telephone Encounter (Signed)
She is technically not due to be filled until 11/02/2021 but she wanted to make sure was sent in to the mail order pharmacy so that she can get it when she is due. I have routed this request to Dr Brett Fairy to send for the pt ?

## 2021-10-24 MED ORDER — CLONAZEPAM 0.5 MG PO TABS: 0.5000 mg | ORAL_TABLET | Freq: Every day | ORAL | 1 refills | Status: DC

## 2021-10-25 DIAGNOSIS — M7062 Trochanteric bursitis, left hip: Secondary | ICD-10-CM | POA: Diagnosis not present

## 2021-10-25 DIAGNOSIS — J3089 Other allergic rhinitis: Secondary | ICD-10-CM | POA: Diagnosis not present

## 2021-10-25 DIAGNOSIS — M5416 Radiculopathy, lumbar region: Secondary | ICD-10-CM | POA: Diagnosis not present

## 2021-10-25 NOTE — Telephone Encounter (Signed)
She can pick it up when ready. ?

## 2021-10-31 DIAGNOSIS — Z1231 Encounter for screening mammogram for malignant neoplasm of breast: Secondary | ICD-10-CM | POA: Diagnosis not present

## 2021-11-05 ENCOUNTER — Other Ambulatory Visit: Payer: Self-pay | Admitting: Internal Medicine

## 2021-11-15 ENCOUNTER — Encounter: Payer: Self-pay | Admitting: Internal Medicine

## 2021-11-15 ENCOUNTER — Ambulatory Visit (INDEPENDENT_AMBULATORY_CARE_PROVIDER_SITE_OTHER): Payer: Medicare Other | Admitting: Family Medicine

## 2021-11-15 VITALS — BP 130/84 | HR 85 | Ht 62.0 in | Wt 190.0 lb

## 2021-11-15 DIAGNOSIS — M25562 Pain in left knee: Secondary | ICD-10-CM

## 2021-11-15 DIAGNOSIS — G8929 Other chronic pain: Secondary | ICD-10-CM

## 2021-11-15 NOTE — Progress Notes (Signed)
? ?  Annetta Maw, am serving as a Education administrator for Dr. Lynne Leader. ? ?Jamie [name] is a 76 y.o. female who presents to [place] at [facility] today for  f/u lumbar radiculopathy w/ L lateral hip and buttocks pain with pain radiating down the left leg in an L5 dermatomal pattern. 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 on 04/30/21 and had a L L4-L5 interlaminar ESI on 05/10/21. Dr. Durward Fortes performed a L GT steroid injection in March of 2022 w/ minimal relief.  Pt only got about 1 week of relief from prior ESI. Pt was last seen by Dr. Georgina Snell on 10/04/21 and was given a L knee steroid injection and was advised to cont PT at [facility]. Today, pt reports that after her knee injection at her last visit worked so wonderful the pain has only started back up a couple weeks ago. Patient has been walking A LOT lately, more than usual. Patient finished her PT about a week ago.  ? ?Dx imaging: 10/04/21 L knee XR ?08/23/21 L-hip & L-spine XR ?            08/30/20 L-spine MRI ?            08/10/20 L-spine XR ? ?Pertinent review of systems: No fevers or chills ? ?Relevant historical information: Neuropathy.  NASH.  Sleep apnea. ? ? ?Exam:  ?BP 130/84   Pulse 85   Ht 5' 2"  (1.575 m)   Wt 190 lb (86.2 kg)   SpO2 97%   BMI 34.75 kg/m?  ?General: Well Developed, well nourished, and in no acute distress.  ? ?MSK: Left knee: Mild effusion normal motion with crepitation. ? ? ? ?Lab and Radiology Results ?EXAM: ?LEFT KNEE 3 VIEWS ?  ?COMPARISON:  None. ?  ?FINDINGS: ?Normal alignment. No acute fracture or dislocation. Mild ?tricompartmental degenerative arthritis. No effusion. Soft tissues ?are unremarkable. ?  ?IMPRESSION: ?Mild tricompartmental degenerative arthritis. ?  ?  ?Electronically Signed ?  By: Fidela Salisbury M.D. ?  On: 10/05/2021 23:41 ?  ?I, Lynne Leader, personally (independently) visualized  and performed the interpretation of the images attached in this note. ? ? ? ? ? ?Assessment and Plan: ?76 y.o. female with left knee pain thought primarily due to DJD.  She has leg pain that has been difficult to treat or understand the etiology of.  She had great benefit to left knee steroid injection in March indicating this is probably the main pain generator in her left knee.  Unfortunately the benefit did not last as long as we would like and her pain is starting to return.  Plan to work on authorization for hyaluronic acid injections.  Additionally she would like to return to physical therapy which is reasonable.  She has an established relationship and [name] physical therapy so she will go back there. ? ?We will see her back for gel shots.  She is going to [place] for conference at the end of June which is about the timeline for her steroid injection.  She can schedule with me for just before then for her repeat steroid injection if needed. ? ? ? ? ? ?Discussed warning signs or symptoms. Please see discharge instructions. Patient expresses understanding. ? ? ?The above documentation has been reviewed and is accurate and complete Lynne Leader, M.D. ? ? ?

## 2021-11-15 NOTE — Patient Instructions (Signed)
Thank you for coming in today.  ? ?Schedule with me before your June trip for your cortisone injection (Monday June 26 ish) ? ?We will get the gel shots authorized.  ? ? ?

## 2021-11-16 ENCOUNTER — Telehealth: Payer: Self-pay

## 2021-11-16 NOTE — Telephone Encounter (Signed)
I filled out a form from [employer] for patient's [employer] . It has been approved thru 11/15/2022.  ?

## 2021-11-20 ENCOUNTER — Telehealth: Payer: Self-pay

## 2021-11-20 NOTE — Telephone Encounter (Signed)
Pt called and informed of approval for Ortho/Monovisc for L knee. Pt was scheduled to start the gel shots for 11/21/21. ?

## 2021-11-21 ENCOUNTER — Ambulatory Visit: Payer: Self-pay

## 2021-11-21 ENCOUNTER — Ambulatory Visit (INDEPENDENT_AMBULATORY_CARE_PROVIDER_SITE_OTHER): Payer: Medicare Other | Admitting: Family Medicine

## 2021-11-21 DIAGNOSIS — M1712 Unilateral primary osteoarthritis, left knee: Secondary | ICD-10-CM

## 2021-11-21 DIAGNOSIS — G8929 Other chronic pain: Secondary | ICD-10-CM | POA: Diagnosis not present

## 2021-11-21 DIAGNOSIS — M25562 Pain in left knee: Secondary | ICD-10-CM

## 2021-11-21 NOTE — Patient Instructions (Signed)
Good to see you ?Orthovisc given in left knee today ?Call or go to the ER if you develop a large red swollen joint with extreme pain or oozing puss.  ?Please schedule your next 2 visits for the other injections  ?

## 2021-11-21 NOTE — Progress Notes (Signed)
Jamie [name] presents to clinic today for Orthovisc injection left knee 1/3 ? ?Procedure: Real-time Ultrasound Guided Injection of left knee superior lateral patellar space ?Device: Philips Affiniti 50G ?Images permanently stored and available for review in PACS ?Verbal informed consent obtained.  Discussed risks and benefits of procedure. Warned about infection, bleeding, damage to structures among others. ?Patient expresses understanding and agreement ?Time-out conducted.   ?Noted no overlying erythema, induration, or other signs of local infection.   ?Skin prepped in a sterile fashion.   ?Local anesthesia: Topical Ethyl chloride.   ?With sterile technique and under real time ultrasound guidance: Orthovisc 30 mg injected into knee joint. Fluid seen entering the joint capsule.   ?Completed without difficulty     ?Advised to call if fevers/chills, erythema, induration, drainage, or persistent bleeding.   ?Images permanently stored and available for review in the ultrasound unit.  ?Impression: Technically successful ultrasound guided injection. ? ? ?Lot number: [id number] ? ?Return in 1 week for Orthovisc injection left knee 2/3 ?

## 2021-11-28 ENCOUNTER — Ambulatory Visit: Payer: Medicare Other

## 2021-11-28 ENCOUNTER — Ambulatory Visit (INDEPENDENT_AMBULATORY_CARE_PROVIDER_SITE_OTHER): Payer: Medicare Other | Admitting: Family Medicine

## 2021-11-28 DIAGNOSIS — G8929 Other chronic pain: Secondary | ICD-10-CM | POA: Diagnosis not present

## 2021-11-28 DIAGNOSIS — M25562 Pain in left knee: Secondary | ICD-10-CM | POA: Diagnosis not present

## 2021-11-28 DIAGNOSIS — M1712 Unilateral primary osteoarthritis, left knee: Secondary | ICD-10-CM

## 2021-11-28 NOTE — Progress Notes (Signed)
Jamie [name] presents to clinic today for Orthovisc injection left knee 2/3  Procedure: Real-time Ultrasound Guided Injection of left knee superior lateral patellar space Device: Philips Affiniti 50G Images permanently stored and available for review in PACS Verbal informed consent obtained.  Discussed risks and benefits of procedure. Warned about infection, bleeding, damage to structures among others. Patient expresses understanding and agreement Time-out conducted.   Noted no overlying erythema, induration, or other signs of local infection.   Skin prepped in a sterile fashion.   Local anesthesia: Topical Ethyl chloride.   With sterile technique and under real time ultrasound guidance: Orthovisc 30 mg injected into the knee joint. Fluid seen entering the joint capsule.   Completed without difficulty   Advised to call if fevers/chills, erythema, induration, drainage, or persistent bleeding.   Images permanently stored and available for review in the ultrasound unit.  Impression: Technically successful ultrasound guided injection.  Lot number: [id number]  Return in 1 week for Orthovisc injection left knee 3/3

## 2021-12-03 ENCOUNTER — Encounter: Payer: Self-pay | Admitting: Neurology

## 2021-12-04 ENCOUNTER — Other Ambulatory Visit: Payer: Self-pay | Admitting: *Deleted

## 2021-12-04 MED ORDER — NURTEC 75 MG PO TBDP
75.0000 mg | ORAL_TABLET | Freq: Every day | ORAL | 11 refills | Status: AC | PRN
Start: 2021-12-04 — End: ?

## 2021-12-05 ENCOUNTER — Ambulatory Visit: Payer: Self-pay

## 2021-12-05 ENCOUNTER — Ambulatory Visit (INDEPENDENT_AMBULATORY_CARE_PROVIDER_SITE_OTHER): Payer: Medicare Other | Admitting: Family Medicine

## 2021-12-05 DIAGNOSIS — M1712 Unilateral primary osteoarthritis, left knee: Secondary | ICD-10-CM | POA: Diagnosis not present

## 2021-12-05 DIAGNOSIS — G8929 Other chronic pain: Secondary | ICD-10-CM

## 2021-12-05 DIAGNOSIS — M25562 Pain in left knee: Secondary | ICD-10-CM | POA: Diagnosis not present

## 2021-12-05 NOTE — Progress Notes (Signed)
Jamie [name] presents to clinic today for Orthovisc injection left knee 3/3  Procedure: Real-time Ultrasound Guided Injection of left knee superior lateral patellar space Device: Philips Affiniti 50G Images permanently stored and available for review in PACS Verbal informed consent obtained.  Discussed risks and benefits of procedure. Warned about infection, bleeding, damage to structures among others. Patient expresses understanding and agreement Time-out conducted.   Noted no overlying erythema, induration, or other signs of local infection.   Skin prepped in a sterile fashion.   Local anesthesia: Topical Ethyl chloride.   With sterile technique and under real time ultrasound guidance: Orthovisc 30 mg injected into knee joint. Fluid seen entering the joint capsule.   Completed without difficulty   Advised to call if fevers/chills, erythema, induration, drainage, or persistent bleeding.   Images permanently stored and available for review in the ultrasound unit.  Impression: Technically successful ultrasound guided injection.  Lot number: [id number]  Return as needed

## 2021-12-05 NOTE — Patient Instructions (Addendum)
Thank you for coming in today.   You received an injection today. Seek immediate medical attention if the joint becomes red, extremely painful, or is oozing fluid.   Check back as needed.  I am happy to see your foot at any time.   Bring your shoes.

## 2021-12-13 ENCOUNTER — Telehealth: Payer: Self-pay | Admitting: Family Medicine

## 2021-12-13 ENCOUNTER — Telehealth: Payer: Self-pay | Admitting: Internal Medicine

## 2021-12-13 DIAGNOSIS — M1712 Unilateral primary osteoarthritis, left knee: Secondary | ICD-10-CM

## 2021-12-13 DIAGNOSIS — M25562 Pain in left knee: Secondary | ICD-10-CM

## 2021-12-13 MED ORDER — NEXIUM 40 MG PO CPDR
40.0000 mg | DELAYED_RELEASE_CAPSULE | Freq: Every day | ORAL | 3 refills | Status: DC
Start: 2021-12-13 — End: 2022-02-04

## 2021-12-13 NOTE — Addendum Note (Signed)
Addended by: Audrea Muscat on: 12/13/2021 03:50 PM   Modules accepted: Orders

## 2021-12-13 NOTE — Telephone Encounter (Signed)
Spoke to patient who states she is having breakthrough symptoms on the generic Nexium.  I sent brand name [facility] to [facility].  Patient will call me with any further problems.

## 2021-12-13 NOTE — Telephone Encounter (Signed)
Inbound call from patient stating she has been taking the generic brand of Nexium. Patient stated she has been having really bad break through's. Patient is requesting a call back to discuss if there is anyway she can get Nexium instead of the generic. Please advise.

## 2021-12-13 NOTE — Telephone Encounter (Signed)
Sounds CMA-y

## 2021-12-13 NOTE — Telephone Encounter (Signed)
Patient called stating that she is still having left knee pain and asked if we could go ahead and order the MRI.  Please advise.

## 2021-12-14 NOTE — Telephone Encounter (Signed)
MRI ordered.  I ordered it to the current med center in [place].  They are usually fast about getting people an appointment.  Recommend recheck with me after the MRI.

## 2021-12-16 ENCOUNTER — Encounter: Payer: Self-pay | Admitting: Internal Medicine

## 2021-12-17 NOTE — Telephone Encounter (Signed)
Called pt and relayed info regarding L knee MRI to [facility].  Pt verbalizes understanding.

## 2021-12-22 ENCOUNTER — Ambulatory Visit (INDEPENDENT_AMBULATORY_CARE_PROVIDER_SITE_OTHER): Payer: Medicare Other

## 2021-12-22 DIAGNOSIS — G8929 Other chronic pain: Secondary | ICD-10-CM

## 2021-12-22 DIAGNOSIS — M25562 Pain in left knee: Secondary | ICD-10-CM | POA: Diagnosis not present

## 2021-12-22 DIAGNOSIS — M1712 Unilateral primary osteoarthritis, left knee: Secondary | ICD-10-CM

## 2021-12-23 ENCOUNTER — Encounter: Payer: Self-pay | Admitting: Internal Medicine

## 2021-12-24 ENCOUNTER — Encounter: Payer: Self-pay | Admitting: Internal Medicine

## 2021-12-24 ENCOUNTER — Ambulatory Visit (INDEPENDENT_AMBULATORY_CARE_PROVIDER_SITE_OTHER): Payer: Medicare Other | Admitting: Internal Medicine

## 2021-12-24 DIAGNOSIS — R7303 Prediabetes: Secondary | ICD-10-CM | POA: Diagnosis not present

## 2021-12-24 DIAGNOSIS — G2581 Restless legs syndrome: Secondary | ICD-10-CM

## 2021-12-24 DIAGNOSIS — M25511 Pain in right shoulder: Secondary | ICD-10-CM | POA: Diagnosis not present

## 2021-12-24 DIAGNOSIS — G8929 Other chronic pain: Secondary | ICD-10-CM | POA: Diagnosis not present

## 2021-12-24 MED ORDER — PREMARIN 0.625 MG/GM VA CREA: 1.0000 | TOPICAL_CREAM | Freq: Every day | VAGINAL | 12 refills | Status: DC

## 2021-12-24 MED ORDER — SPIRONOLACTONE 50 MG PO TABS: 50.0000 mg | ORAL_TABLET | Freq: Every day | ORAL | 3 refills | Status: DC

## 2021-12-24 MED ORDER — METAXALONE 400 MG PO TABS: 400.0000 mg | ORAL_TABLET | Freq: Every day | ORAL | 3 refills | Status: DC

## 2021-12-24 NOTE — Patient Instructions (Signed)
We have sent in the refills.

## 2021-12-24 NOTE — Progress Notes (Signed)
   Subjective:   Patient ID: Jamie [name], female    DOB: 05-23-1946, 76 y.o.   MRN: [id number]  HPI The patient is a 76 YO female coming in for follow up.  Review of Systems  Constitutional: Negative.   HENT: Negative.    Eyes: Negative.   Respiratory:  Negative for cough, chest tightness and shortness of breath.   Cardiovascular:  Negative for chest pain, palpitations and leg swelling.  Gastrointestinal:  Negative for abdominal distention, abdominal pain, constipation, diarrhea, nausea and vomiting.  Musculoskeletal: Negative.   Skin: Negative.   Neurological: Negative.   Psychiatric/Behavioral: Negative.      Objective:  Physical Exam Constitutional:      Appearance: She is well-developed.  HENT:     Head: Normocephalic and atraumatic.  Cardiovascular:     Rate and Rhythm: Normal rate and regular rhythm.  Pulmonary:     Effort: Pulmonary effort is normal. No respiratory distress.     Breath sounds: Normal breath sounds. No wheezing or rales.  Abdominal:     General: Bowel sounds are normal. There is no distension.     Palpations: Abdomen is soft.     Tenderness: There is no abdominal tenderness. There is no rebound.  Musculoskeletal:     Cervical back: Normal range of motion.  Skin:    General: Skin is warm and dry.  Neurological:     Mental Status: She is alert and oriented to person, place, and time.     Coordination: Coordination normal.     Vitals:   12/24/21 1337  BP: 110/76  Pulse: 75  Temp: 98.8 F (37.1 C)  TempSrc: Oral  SpO2: 93%  Weight: 188 lb 4 oz (85.4 kg)  Height: 5' 2"  (1.575 m)    Assessment & Plan:

## 2021-12-25 ENCOUNTER — Telehealth: Payer: Self-pay

## 2021-12-25 ENCOUNTER — Telehealth: Payer: Self-pay | Admitting: Internal Medicine

## 2021-12-25 NOTE — Assessment & Plan Note (Signed)
Did not get relief from mirapex and has elected to let her neurologist treat this. Okay with continuing current medications with neurology.

## 2021-12-25 NOTE — Telephone Encounter (Signed)
Received a call from patient's husband who is requesting a call from the Practice Manager regarding patient's medication taking so long to be prior authorized.  They have sent several messages, including the phone number for [facility], and said if someone here would call them, they could fix the issue immediately.  It appears the information was forwarded to the prior auth folks, but patient nor [facility] seems to have heard anything.  This has been going on at least since 6/11.  Please call patient's husband and explain.  Thank you.

## 2021-12-25 NOTE — Assessment & Plan Note (Signed)
Keep skelaxin 400 mg qhs and refilled today.

## 2021-12-25 NOTE — Telephone Encounter (Signed)
Hey would you mind checking on this for me? Evidently it is still not approved and the patient is getting frustrated.  Let me know if I can do anything to help.  Thanks!!!!!

## 2021-12-25 NOTE — Progress Notes (Signed)
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  This is probably the source of your pain.  You do have some small meniscus tears that may be a source of pain as well.  We are scheduled to see each other on June 26.  At that time we will talk about the results of the MRI in full detail and talk about treatment plan and options from here.

## 2021-12-25 NOTE — Assessment & Plan Note (Signed)
Stable on recent lab draw and will continue monitoring every 6-12 months.

## 2021-12-27 ENCOUNTER — Other Ambulatory Visit (HOSPITAL_COMMUNITY): Payer: Self-pay

## 2021-12-27 ENCOUNTER — Telehealth: Payer: Self-pay | Admitting: Pharmacy Technician

## 2021-12-27 NOTE — Telephone Encounter (Signed)
I spoke with the patient's husband and apologized for the delay. All of his questions have been answered.

## 2021-12-27 NOTE — Telephone Encounter (Signed)
PA for Nexium has been submitted

## 2021-12-28 ENCOUNTER — Encounter: Payer: Self-pay | Admitting: Internal Medicine

## 2021-12-30 ENCOUNTER — Other Ambulatory Visit (HOSPITAL_COMMUNITY): Payer: Self-pay

## 2021-12-31 ENCOUNTER — Ambulatory Visit: Payer: Self-pay

## 2021-12-31 ENCOUNTER — Ambulatory Visit (INDEPENDENT_AMBULATORY_CARE_PROVIDER_SITE_OTHER): Payer: Medicare Other | Admitting: Family Medicine

## 2021-12-31 VITALS — BP 118/76 | HR 83 | Ht 62.0 in | Wt 186.4 lb

## 2021-12-31 DIAGNOSIS — G8929 Other chronic pain: Secondary | ICD-10-CM

## 2021-12-31 DIAGNOSIS — M1712 Unilateral primary osteoarthritis, left knee: Secondary | ICD-10-CM

## 2021-12-31 DIAGNOSIS — M7062 Trochanteric bursitis, left hip: Secondary | ICD-10-CM

## 2021-12-31 DIAGNOSIS — M25562 Pain in left knee: Secondary | ICD-10-CM

## 2021-12-31 MED ORDER — PREMARIN 0.625 MG/GM VA CREA: 1.0000 | TOPICAL_CREAM | VAGINAL | 12 refills | Status: DC

## 2021-12-31 NOTE — Telephone Encounter (Signed)
Sent mychart message to patient to let her know brand name Nexium was denied

## 2022-01-12 DIAGNOSIS — J069 Acute upper respiratory infection, unspecified: Secondary | ICD-10-CM | POA: Diagnosis not present

## 2022-02-04 ENCOUNTER — Encounter: Payer: Self-pay | Admitting: Internal Medicine

## 2022-02-04 ENCOUNTER — Ambulatory Visit (INDEPENDENT_AMBULATORY_CARE_PROVIDER_SITE_OTHER): Payer: Medicare Other | Admitting: Internal Medicine

## 2022-02-04 VITALS — BP 118/78 | HR 76 | Ht 62.0 in | Wt 187.0 lb

## 2022-02-04 DIAGNOSIS — Z860100 Personal history of colon polyps, unspecified: Secondary | ICD-10-CM

## 2022-02-04 DIAGNOSIS — K589 Irritable bowel syndrome without diarrhea: Secondary | ICD-10-CM | POA: Diagnosis not present

## 2022-02-04 DIAGNOSIS — R1013 Epigastric pain: Secondary | ICD-10-CM

## 2022-02-04 DIAGNOSIS — K219 Gastro-esophageal reflux disease without esophagitis: Secondary | ICD-10-CM

## 2022-02-04 MED ORDER — FAMOTIDINE 20 MG PO TABS: 20.0000 mg | ORAL_TABLET | Freq: Two times a day (BID) | ORAL | 3 refills | Status: DC

## 2022-02-04 NOTE — Patient Instructions (Signed)
If you are age 74 or older, your body mass index should be between 23-30. Your Body mass index is 34.2 kg/m. If this is out of the aforementioned range listed, please consider follow up with your Primary Care Provider.  If you are age 66 or younger, your body mass index should be between 19-25. Your Body mass index is 34.2 kg/m. If this is out of the aformentioned range listed, please consider follow up with your Primary Care Provider.   ________________________________________________________  The [facility] providers would like to encourage you to use [facility] to communicate with providers for non-urgent requests or questions.  Due to long hold times on the telephone, sending your provider a message by [facility] may be a faster and more efficient way to get a response.  Please allow 48 business hours for a response.  Please remember that this is for non-urgent requests.  _______________________________________________________  We have sent the following medications to your pharmacy for you to pick up at your convenience:  Pepcid  You have been scheduled for an endoscopy. Please follow written instructions given to you at your visit today. If you use inhalers (even only as needed), please bring them with you on the day of your procedure.

## 2022-02-04 NOTE — Progress Notes (Signed)
HISTORY OF PRESENT ILLNESS:  Jamie [name] is a 76 y.o. female with a history of diverticulitis, adenomatous colon polyps, GERD, and constipation predominant irritable bowel syndrome.  She was last evaluated in the office September 06, 2021.  See that dictation.  She presents today with new complaint of substernal burning after eating a lunch salad at [facility].  She states this has been going on for approximately 3 months.  She will start noticing the symptom she gets home after having had her lunch.  This is reliably relieved within 10 minutes by Pepcid.  She does take Nexium 40 mg twice daily chronically for her GERD.  She has not had an upper endoscopy in many years.  She describes choking sensation at times, but no true esophageal dysphagia.  She does not notices at other times with other meals.  REVIEW OF SYSTEMS:  All non-GI ROS negative unless otherwise stated in the HPI except for low back pain, fatigue, insomnia  Past Medical History:  Diagnosis Date   Allergic rhinitis    Anxiety    Arthritis    left foot   Asthma    Back pain    Blood transfusion without reported diagnosis    Colon polyps    adenomatous   Constipation    Diverticulosis    Dysrhythmia 1999   Dr Johnsie Cancel 2003   Elevated LFTs    Fatty liver disease, nonalcoholic    GERD (gastroesophageal reflux disease)    Headache(784.0)    migraines with vision changes but no pain   Heart murmur    Hypertension    IBS (irritable bowel syndrome)    Joint pain    Migraines    Muscle cramps    Neuromuscular disorder (HCC)    RLS   Obesity    Palpitations    Pneumonia 2007   Renal cell carcinoma ([place]) 2013   minimal partial nephrectomy   Restless leg syndrome    Sleep apnea    moderate; wears CPAP, uncertain about setting   TIA (transient ischemic attack) 09/11/2014   Ulcer    Urinary incontinence    Vitamin D deficiency     Past Surgical History:  Procedure Laterality Date   BREAST REDUCTION SURGERY  Jan 1991    ELBOW SURGERY Left    KIDNEY SURGERY Right October 2013    Tumor removed    LAPAROSCOPIC APPENDECTOMY N/A 06/08/2018   Procedure: APPENDECTOMY LAPAROSCOPIC;  Surgeon: Jovita Kussmaul, MD;  Location: [facility];  Service: General;  Laterality: N/A;   [place]   partial    Social History Jamie [name]  reports that she has never smoked. She has never used smokeless tobacco. She reports current alcohol use of about 2.0 - 3.0 standard drinks of alcohol per week. She reports that she does not use drugs.  family history includes Allergies in her brother and daughter; Anxiety disorder in her mother; Colon cancer in her cousin; Heart disease in her brother; Heart disease (age of onset: 84) in her father; Hypertension in her mother and another family member; Lung cancer in her father and sister; Melanoma in her daughter.  Allergies  Allergen Reactions   Buprenorphine Hcl Rash and Shortness Of Breath   Morphine And Related Shortness Of Breath and Rash   Morphine And Related Shortness Of Breath   Aspirin Other (See Comments)    REACTION: ulcers  Can take 81 mg   Avelox [Moxifloxacin Hcl In Nacl]  Hives   Diflucan [Fluconazole] Hives    Whelps and rash c skin peeling   Diflucan [Fluconazole] Hives   Hydrochlorothiazide Other (See Comments)   Latex Rash   Moxifloxacin Palpitations    Tachycardia        PHYSICAL EXAMINATION: Vital signs: BP 118/78   Pulse 76   Ht 5' 2"  (1.575 m)   Wt 187 lb (84.8 kg)   BMI 34.20 kg/m   Constitutional: generally well-appearing, no acute distress Psychiatric: alert and oriented x3, cooperative Eyes: extraocular movements intact, anicteric, conjunctiva pink Mouth: oral pharynx moist, no lesions Neck: supple no lymphadenopathy Cardiovascular: heart regular rate and rhythm, no murmur Lungs: clear to auscultation bilaterally Abdomen: soft, nontender, nondistended, no obvious ascites, no peritoneal signs, normal bowel  sounds, no organomegaly Rectal: Omitted Extremities: no clubbing, cyanosis, or lower extremity edema bilaterally Skin: no lesions on visible extremities Neuro: No focal deficits.  Cranial nerves intact.  ASSESSMENT:  1.  Epigastric burning discomfort after particular lunches 3 months duration.  This despite twice daily PPI and patient with known GERD.  Remote history of ulcer disease.  Seems to respond to Pepcid on demand. 2.  Vague dysphagia.  Most consistent with oropharyngeal at times 3.  History of diverticulitis 4.  History of adenomatous colon polyps.  Surveillance up-to-date 5.  Constipation predominant irritable bowel syndrome 6.  General medical problems   PLAN:  1.  Reflux precautions 2.  Continue Nexium twice daily 3.  Prescribed Pepcid 20 mg daily for on-demand use.  Medication list reviewed. 4.  Schedule upper endoscopy to evaluate epigastric discomfort and vague dysphagia.The nature of the procedure, as well as the risks, benefits, and alternatives were carefully and thoroughly reviewed with the patient. Ample time for discussion and questions allowed. The patient understood, was satisfied, and agreed to proceed.  5.  Surveillance colonoscopy around June 2026 6.  If endoscopy does not show any significant or worrisome abnormalities, then continue with on-demand Pepcid use.  May even consider taking Premeal.

## 2022-02-04 NOTE — Addendum Note (Signed)
Addended by: Julieanne Cotton K on: 02/04/2022 11:05 AM   Modules accepted: Orders

## 2022-02-12 ENCOUNTER — Ambulatory Visit: Payer: Medicare Other | Admitting: Family Medicine

## 2022-02-13 ENCOUNTER — Ambulatory Visit (AMBULATORY_SURGERY_CENTER): Payer: Medicare Other | Admitting: Internal Medicine

## 2022-02-13 ENCOUNTER — Encounter: Payer: Self-pay | Admitting: Internal Medicine

## 2022-02-13 ENCOUNTER — Encounter (INDEPENDENT_AMBULATORY_CARE_PROVIDER_SITE_OTHER): Payer: Self-pay

## 2022-02-13 VITALS — BP 110/80 | HR 73 | Temp 97.5°F | Resp 19 | Ht 62.0 in | Wt 187.0 lb

## 2022-02-13 DIAGNOSIS — R1013 Epigastric pain: Secondary | ICD-10-CM

## 2022-02-13 DIAGNOSIS — K219 Gastro-esophageal reflux disease without esophagitis: Secondary | ICD-10-CM

## 2022-02-13 MED ORDER — SODIUM CHLORIDE 0.9 % IV SOLN: 500.0000 mL | Freq: Once | INTRAVENOUS | Status: DC

## 2022-02-13 NOTE — Op Note (Signed)
[place] Patient Name: Jamie [name] Procedure Date: 02/13/2022 2:41 PM MRN: [id number] Endoscopist: Docia Chuck. Henrene Pastor , MD Age: 76 Referring MD:  Date of Birth: 08/02/1945 Gender: Female Account #: [id number] Procedure:                Upper GI endoscopy Indications:              Epigastric abdominal pain. Doing better since she                            has been using Pepcid Pre-meal Medicines:                Monitored Anesthesia Care Procedure:                Pre-Anesthesia Assessment:                           - Prior to the procedure, a History and Physical                            was performed, and patient medications and                            allergies were reviewed. The patient's tolerance of                            previous anesthesia was also reviewed. The risks                            and benefits of the procedure and the sedation                            options and risks were discussed with the patient.                            All questions were answered, and informed consent                            was obtained. Prior Anticoagulants: The patient has                            taken no previous anticoagulant or antiplatelet                            agents. ASA Grade Assessment: II - A patient with                            mild systemic disease. After reviewing the risks                            and benefits, the patient was deemed in                            satisfactory condition to undergo the procedure.  After obtaining informed consent, the endoscope was                            passed under direct vision. Throughout the                            procedure, the patient's blood pressure, pulse, and                            oxygen saturations were monitored continuously. The                            Endoscope was introduced through the mouth, and                            advanced to the second part  of duodenum. The upper                            GI endoscopy was accomplished without difficulty.                            The patient tolerated the procedure well. Scope In: Scope Out: Findings:                 The esophagus was normal.                           The stomach was normal.                           The examined duodenum was normal.                           The cardia and gastric fundus were normal on                            retroflexion. Complications:            No immediate complications. Estimated Blood Loss:     Estimated blood loss: none. Impression:               - Normal esophagus.                           - Normal stomach.                           - Normal examined duodenum.                           - No specimens collected. Recommendation:           - Patient has a contact number available for                            emergencies. The signs and symptoms of potential  delayed complications were discussed with the                            patient. Return to normal activities tomorrow.                            Written discharge instructions were provided to the                            patient.                           - Resume previous diet.                           - Continue present medications. Docia Chuck. Henrene Pastor, MD 02/13/2022 2:48:03 PM This report has been signed electronically.

## 2022-02-13 NOTE — Progress Notes (Signed)
HISTORY OF PRESENT ILLNESS:   Jamie [name] is a 76 y.o. female with a history of diverticulitis, adenomatous colon polyps, GERD, and constipation predominant irritable bowel syndrome.  She was last evaluated in the office September 06, 2021.  See that dictation.  She presents today with new complaint of substernal burning after eating a lunch salad at [facility].  She states this has been going on for approximately 3 months.  She will start noticing the symptom she gets home after having had her lunch.  This is reliably relieved within 10 minutes by Pepcid.  She does take Nexium 40 mg twice daily chronically for her GERD.  She has not had an upper endoscopy in many years.  She describes choking sensation at times, but no true esophageal dysphagia.  She does not notices at other times with other meals.   REVIEW OF SYSTEMS:   All non-GI ROS negative unless otherwise stated in the HPI except for low back pain, fatigue, insomnia       Past Medical History:  Diagnosis Date   Allergic rhinitis     Anxiety     Arthritis      left foot   Asthma     Back pain     Blood transfusion without reported diagnosis     Colon polyps      adenomatous   Constipation     Diverticulosis     Dysrhythmia 1999    Dr Johnsie Cancel 2003   Elevated LFTs     Fatty liver disease, nonalcoholic     GERD (gastroesophageal reflux disease)     Headache(784.0)      migraines with vision changes but no pain   Heart murmur     Hypertension     IBS (irritable bowel syndrome)     Joint pain     Migraines     Muscle cramps     Neuromuscular disorder (HCC)      RLS   Obesity     Palpitations     Pneumonia 2007   Renal cell carcinoma ([place]) 2013    minimal partial nephrectomy   Restless leg syndrome     Sleep apnea      moderate; wears CPAP, uncertain about setting   TIA (transient ischemic attack) 09/11/2014   Ulcer     Urinary incontinence     Vitamin D deficiency             Past Surgical History:  Procedure  Laterality Date   BREAST REDUCTION SURGERY   Jan 1991   ELBOW SURGERY Left     KIDNEY SURGERY Right October 2013     Tumor removed    LAPAROSCOPIC APPENDECTOMY N/A 06/08/2018    Procedure: APPENDECTOMY LAPAROSCOPIC;  Surgeon: Jovita Kussmaul, MD;  Location: [facility];  Service: General;  Laterality: N/A;   [place]    partial      Social History Jamie [name]  reports that she has never smoked. She has never used smokeless tobacco. She reports current alcohol use of about 2.0 - 3.0 standard drinks of alcohol per week. She reports that she does not use drugs.   family history includes Allergies in her brother and daughter; Anxiety disorder in her mother; Colon cancer in her cousin; Heart disease in her brother; Heart disease (age of onset: 52) in her father; Hypertension in her mother and another family member; Lung cancer in her father and  sister; Melanoma in her daughter.        Allergies  Allergen Reactions   Buprenorphine Hcl Rash and Shortness Of Breath   Morphine And Related Shortness Of Breath and Rash   Morphine And Related Shortness Of Breath   Aspirin Other (See Comments)      REACTION: ulcers   Can take 81 mg   Avelox [Moxifloxacin Hcl In Nacl] Hives   Diflucan [Fluconazole] Hives      Whelps and rash c skin peeling   Diflucan [Fluconazole] Hives   Hydrochlorothiazide Other (See Comments)   Latex Rash   Moxifloxacin Palpitations      Tachycardia            PHYSICAL EXAMINATION: Vital signs: BP 118/78   Pulse 76   Ht 5' 2"  (1.575 m)   Wt 187 lb (84.8 kg)   BMI 34.20 kg/m   Constitutional: generally well-appearing, no acute distress Psychiatric: alert and oriented x3, cooperative Eyes: extraocular movements intact, anicteric, conjunctiva pink Mouth: oral pharynx moist, no lesions Neck: supple no lymphadenopathy Cardiovascular: heart regular rate and rhythm, no murmur Lungs: clear to auscultation bilaterally Abdomen:  soft, nontender, nondistended, no obvious ascites, no peritoneal signs, normal bowel sounds, no organomegaly Rectal: Omitted Extremities: no clubbing, cyanosis, or lower extremity edema bilaterally Skin: no lesions on visible extremities Neuro: No focal deficits.  Cranial nerves intact.   ASSESSMENT:   1.  Epigastric burning discomfort after particular lunches 3 months duration.  This despite twice daily PPI and patient with known GERD.  Remote history of ulcer disease.  Seems to respond to Pepcid on demand. 2.  Vague dysphagia.  Most consistent with oropharyngeal at times 3.  History of diverticulitis 4.  History of adenomatous colon polyps.  Surveillance up-to-date 5.  Constipation predominant irritable bowel syndrome 6.  General medical problems     PLAN:   1.  Reflux precautions 2.  Continue Nexium twice daily 3.  Prescribed Pepcid 20 mg daily for on-demand use.  Medication list reviewed. 4.  Schedule upper endoscopy to evaluate epigastric discomfort and vague dysphagia.The nature of the procedure, as well as the risks, benefits, and alternatives were carefully and thoroughly reviewed with the patient. Ample time for discussion and questions allowed. The patient understood, was satisfied, and agreed to proceed.  5.  Surveillance colonoscopy around June 2026 6.  If endoscopy does not show any significant or worrisome abnormalities, then continue with on-demand Pepcid use.  May even consider taking Premeal.

## 2022-02-13 NOTE — Patient Instructions (Signed)
YOU HAD AN ENDOSCOPIC PROCEDURE TODAY AT [facility]:   Refer to the procedure report that was given to you for any specific questions about what was found during the examination.  If the procedure report does not answer your questions, please call your gastroenterologist to clarify.  If you requested that your care partner not be given the details of your procedure findings, then the procedure report has been included in a sealed envelope for you to review at your convenience later.  YOU SHOULD EXPECT: Some feelings of bloating in the abdomen. Passage of more gas than usual.  Walking can help get rid of the air that was put into your GI tract during the procedure and reduce the bloating. If you had a lower endoscopy (such as a colonoscopy or flexible sigmoidoscopy) you may notice spotting of blood in your stool or on the toilet paper. If you underwent a bowel prep for your procedure, you may not have a normal bowel movement for a few days.  Please Note:  You might notice some irritation and congestion in your nose or some drainage.  This is from the oxygen used during your procedure.  There is no need for concern and it should clear up in a day or so.  SYMPTOMS TO REPORT IMMEDIATELY:  Following upper endoscopy (EGD)  Vomiting of blood or coffee ground material  New chest pain or pain under the shoulder blades  Painful or persistently difficult swallowing  New shortness of breath  Fever of 100F or higher  Black, tarry-looking stools  For urgent or emergent issues, a gastroenterologist can be reached at any hour by calling [phone]. Do not use MyChart messaging for urgent concerns.    DIET:  We do recommend a small meal at first, but then you may proceed to your regular diet.  Drink plenty of fluids but you should avoid alcoholic beverages for 24 hours.  ACTIVITY:  You should plan to take it easy for the rest of today and you should NOT DRIVE or use heavy machinery until  tomorrow (because of the sedation medicines used during the test).    FOLLOW UP: Our staff will call the number listed on your records the next business day following your procedure.  We will call around 7:15- 8:00 am to check on you and address any questions or concerns that you may have regarding the information given to you following your procedure. If we do not reach you, we will leave a message.  If you develop any symptoms (ie: fever, flu-like symptoms, shortness of breath, cough etc.) before then, please call [phone].  If you test positive for Covid 19 in the 2 weeks post procedure, please call and report this information to [name].    If any biopsies were taken you will be contacted by phone or by letter within the next 1-3 weeks.  Please call us at [phone] if you have not heard about the biopsies in 3 weeks.    SIGNATURES/CONFIDENTIALITY: You and/or your care partner have signed paperwork which will be entered into your electronic medical record.  These signatures attest to the fact that that the information above on your After Visit Summary has been reviewed and is understood.  Full responsibility of the confidentiality of this discharge information lies with you and/or your care-partner.

## 2022-02-13 NOTE — Progress Notes (Signed)
Pt's states no medical or surgical changes since previsit or office visit. 

## 2022-02-13 NOTE — Progress Notes (Signed)
Sedate, gd SR, tolerated procedure well, VSS, report to RN 

## 2022-02-14 ENCOUNTER — Telehealth: Payer: Self-pay

## 2022-02-14 NOTE — Telephone Encounter (Signed)
  Follow up Call-     02/13/2022    1:53 PM 12/14/2019   12:29 PM  Call back number  Post procedure Call Back phone  # [phone]  Permission to leave phone message Yes Yes     Left message

## 2022-04-01 NOTE — Patient Instructions (Signed)

## 2022-04-01 NOTE — Progress Notes (Unsigned)
Subjective:   Jamie [name] is a 76 y.o. female who presents for an Initial Medicare Annual Wellness Visit. I connected with  Quita Skye on 04/02/22 by a audio enabled telemedicine application and verified that I am speaking with the correct person using two identifiers.  Patient Location: Home  Provider Location: Home Office  I discussed the limitations of evaluation and management by telemedicine. The patient expressed understanding and agreed to proceed.  Review of Systems    Deferred to PCP Cardiac Risk Factors include: advanced age (>55mn, >>14women);hypertension;obesity (BMI >30kg/m2)     Objective:    Today's Vitals   04/02/22 1533  PainSc: 2    There is no height or weight on file to calculate BMI.     04/02/2022    3:42 PM 06/09/2018    7:36 AM 06/09/2018   12:49 AM 06/08/2018   11:34 AM 11/06/2017    7:29 PM 01/13/2017   10:43 AM 11/20/2016    4:56 PM  Advanced Directives  Does Patient Have a Medical Advance Directive? Yes  Yes No   No  Type of AParamedicof [place]arlene Village[place] will Healthcare Power of [place] will      Does patient want to make changes to medical advance directive? No - Patient declined        Copy of [place] Chart? No - copy requested No - copy requested No - copy requested      Would patient like information on creating a medical advance directive?       No - Patient declined     Information is confidential and restricted. Go to Review Flowsheets to unlock data.    Current Medications (verified) Outpatient Encounter Medications as of 04/02/2022  Medication Sig   ALPHA-LIPOIC ACID PO Take 1,200 mg by mouth daily at 2 PM.   AMBULATORY NON FORMULARY MEDICATION Medication Name: Multi Collagen Protein once daily   aspirin EC 81 MG tablet Take 1 tablet (81 mg total) by mouth daily.   BIOTIN PO Take 1 capsule by mouth daily.    clonazePAM (KLONOPIN) 0.5 MG  tablet Take 1 tablet (0.5 mg total) by mouth at bedtime. Take one at bedtime po   conjugated estrogens (PREMARIN) vaginal cream Place 1 Applicatorful vaginally every other day.   esomeprazole (NEXIUM) 40 MG capsule TAKE 1 CAPSULE TWICE A DAY BEFORE MEALS   famotidine (PEPCID) 20 MG tablet Take 20 mg by mouth 2 (two) times daily.   famotidine (PEPCID) 20 MG tablet Take 1 tablet (20 mg total) by mouth 2 (two) times daily.   GLUCOSAMINE-CHONDROITIN PO Take 1 tablet by mouth daily.   LIBRAX 5-2.5 MG capsule TAKE 1 CAPSULE TWICE A DAY   linaclotide (LINZESS) 290 MCG CAPS capsule TAKE 1 CAPSULE DAILY BEFORE BREAKFAST   Magnesium 250 MG TABS Take 1 tablet by mouth daily at 2 PM.   metaxalone (SKELAXIN) 400 MG tablet Take 1 tablet (400 mg total) by mouth at bedtime.   metFORMIN (GLUCOPHAGE) 500 MG tablet Take 1 tablet (500 mg total) by mouth daily with breakfast.   Omega-3-6-9 CAPS Take 1 capsule by mouth every evening.   Prenatal Vit-Fe Fumarate-FA (PRENATAL MULTIVITAMIN) TABS tablet Take 1 tablet by mouth every evening.    Probiotic Product (PROBIOTIC-10 PO) Take 1 capsule by mouth 2 (two) times daily.    Rimegepant Sulfate (NURTEC) 75 MG TBDP Take 75 mg by mouth daily as needed (take for  abortive therapy of migraine, no more than 1 tablet in 24 hours or 8 per month).   spironolactone (ALDACTONE) 50 MG tablet Take 1 tablet (50 mg total) by mouth daily.   TURMERIC PO Take 1 tablet by mouth daily.   vitamin C (ASCORBIC ACID) 500 MG tablet Take 500 mg by mouth daily.   Vitamin D-Vitamin K (VITAMIN K2-VITAMIN D3 PO) Take 1 tablet by mouth daily.   pramipexole (MIRAPEX) 0.25 MG tablet Take 1 tablet (0.25 mg total) by mouth at bedtime. (Patient not taking: Reported on 02/04/2022)   No facility-administered encounter medications on file as of 04/02/2022.    Allergies (verified) Buprenorphine hcl, Morphine and related, Morphine and related, Aspirin, Avelox [moxifloxacin hcl in nacl], Diflucan [fluconazole],  Diflucan [fluconazole], Hydrochlorothiazide, Latex, and Moxifloxacin   History: Past Medical History:  Diagnosis Date   Allergic rhinitis    Anxiety    Arthritis    left foot   Asthma    Back pain    Blood transfusion without reported diagnosis    Colon polyps    adenomatous   Constipation    Diverticulosis    Dysrhythmia 1999   Dr Johnsie Cancel 2003   Elevated LFTs    Fatty liver disease, nonalcoholic    GERD (gastroesophageal reflux disease)    Headache(784.0)    migraines with vision changes but no pain   Heart murmur    Hypertension    IBS (irritable bowel syndrome)    Joint pain    Migraines    Muscle cramps    Neuromuscular disorder (HCC)    RLS   Obesity    Palpitations    Pneumonia 2007   Renal cell carcinoma ([place]) 2013   minimal partial nephrectomy   Restless leg syndrome    Sleep apnea    moderate; wears CPAP, uncertain about setting   TIA (transient ischemic attack) 09/11/2014   Ulcer    Urinary incontinence    Vitamin D deficiency    Past Surgical History:  Procedure Laterality Date   BREAST REDUCTION SURGERY  Jan 1991   ELBOW SURGERY Left    KIDNEY SURGERY Right October 2013    Tumor removed    LAPAROSCOPIC APPENDECTOMY N/A 06/08/2018   Procedure: APPENDECTOMY LAPAROSCOPIC;  Surgeon: Jovita Kussmaul, MD;  Location: [facility];  Service: General;  Laterality: N/A;   TONSILLECTOMY     VAGINAL HYSTERECTOMY  1979   partial   Family History  Problem Relation Age of Onset   Hypertension Mother    Anxiety disorder Mother    Heart disease Father 69   Lung cancer Father        smoker   Lung cancer Sister    Heart disease Brother    Allergies Brother    Allergies Daughter    Melanoma Daughter    Colon cancer Cousin    Hypertension Other    Liver disease Neg Hx    Asthma Neg Hx    Esophageal cancer Neg Hx    Rectal cancer Neg Hx    Stomach cancer Neg Hx    Liver cancer Neg Hx    Prostate cancer Neg Hx    Pancreatic cancer Neg Hx    Social History    Socioeconomic History   Marital status: Married    Spouse name: Sallyanne Kuster   Number of children: 2   Years of education: 15   Highest education level: Not on file  Occupational History   Occupation: Retired    Fish farm manager:  OTHER    Comment: real estate agent  Tobacco Use   Smoking status: Never   Smokeless tobacco: Never  Vaping Use   Vaping Use: Never used  Substance and Sexual Activity   Alcohol use: Yes    Alcohol/week: 2.0 - 3.0 standard drinks of alcohol    Types: 1 - 2 Glasses of Tynasia Mccaul, 1 Shots of liquor per week    Comment: occassionally   Drug use: No   Sexual activity: Yes    Birth control/protection: Surgical  Other Topics Concern   Not on file  Social History Narrative   Married Sallyanne Kuster) and lives with her spouse.   Retired Personal assistant.   Education- college   Patient is right handed.   Patient drinks tea a lot and water.   Social Determinants of Health   Financial Resource Strain: Low Risk  (04/02/2022)   Overall Financial Resource Strain (CARDIA)    Difficulty of Paying Living Expenses: Not hard at all  Food Insecurity: No Food Insecurity (04/02/2022)   Hunger Vital Sign    Worried About Running Out of Food in the Last Year: Never true    Ran Out of Food in the Last Year: Never true  Transportation Needs: No Transportation Needs (04/02/2022)   PRAPARE - Hydrologist (Medical): No    Lack of Transportation (Non-Medical): No  Physical Activity: Insufficiently Active (04/02/2022)   Exercise Vital Sign    Days of Exercise per Week: 4 days    Minutes of Exercise per Session: 20 min  Stress: No Stress Concern Present (04/02/2022)   [place]    Feeling of Stress : Not at all  Social Connections: [place] (04/02/2022)   Social Connection and Isolation Panel [NHANES]    Frequency of Communication with Friends and Family: More than three times a week     Frequency of Social Gatherings with Friends and Family: Three times a week    Attends Religious Services: More than 4 times per year    Active Member of Clubs or Organizations: Yes    Attends Music therapist: More than 4 times per year    Marital Status: Married    Tobacco Counseling Counseling given: Not Answered   Clinical Intake:  Pre-visit preparation completed: Yes  Pain : 0-10 Pain Score: 2  Pain Type: Chronic pain Pain Location: Back Pain Orientation: Lower Pain Descriptors / Indicators: Discomfort, Aching     Nutritional Status: BMI > 30  Obese Nutritional Risks: None Diabetes: No  How often do you need to have someone help you when you read instructions, pamphlets, or other written materials from your doctor or pharmacy?: 1 - Never What is the last grade level you completed in school?: college  Diabetic?No  Interpreter Needed?: No  Information entered by :: Emelia Loron RN   Activities of Daily Living    04/02/2022    3:41 PM  In your present state of health, do you have any difficulty performing the following activities:  Hearing? 0  Vision? 0  Difficulty concentrating or making decisions? 0  Walking or climbing stairs? 0  Dressing or bathing? 0  Doing errands, shopping? 0  Preparing Food and eating ? N  Using the Toilet? N  In the past six months, have you accidently leaked urine? N  Do you have problems with loss of bowel control? N  Managing your Medications? N  Managing your Finances? N  Housekeeping or managing your Housekeeping? N    Patient Care Team: Hoyt Koch, MD as PCP - General (Internal Medicine)  Indicate any recent Medical Services you may have received from other than [facility] providers in the past year (date may be approximate).     Assessment:   This is a routine wellness examination for Coni.  Hearing/Vision screen No results found.  Dietary issues and exercise activities discussed: Current Exercise  Habits: The patient does not participate in regular exercise at present, Exercise limited by: orthopedic condition(s)   Goals Addressed             This Visit's Progress    Patient Stated       I would like to lose weight.      Depression Screen    04/02/2022    3:39 PM 12/24/2021    1:45 PM 09/18/2021    2:13 PM 08/25/2017    9:52 AM  PHQ 2/9 Scores  PHQ - 2 Score 0 0 0   PHQ- 9 Score         Information is confidential and restricted. Go to Review Flowsheets to unlock data.    Fall Risk    04/02/2022    3:58 PM 12/24/2021    1:45 PM 09/18/2021    2:13 PM 06/02/2019   11:34 AM 01/19/2018    1:29 PM  Fall Risk   Falls in the past year? 1 0 0 0   Comment    Emmi Telephone Survey: data to providers prior to load   Number falls in past yr: 0  0    Injury with Fall? 0  0    Risk for fall due to : History of fall(s)      Follow up Falls evaluation completed         Information is confidential and restricted. Go to Review Flowsheets to unlock data.    FALL RISK PREVENTION PERTAINING TO THE HOME:  Any stairs in or around the home? Yes  If so, are there any without handrails? Yes  Home free of loose throw rugs in walkways, pet beds, electrical cords, etc? Yes  Adequate lighting in your home to reduce risk of falls? Yes   ASSISTIVE DEVICES UTILIZED TO PREVENT FALLS:  Life alert? No  Use of a cane, walker or w/c? No  Grab bars in the bathroom? Yes  Shower chair or bench in shower? No  Elevated toilet seat or a handicapped toilet? Yes   Cognitive Function:        04/02/2022    3:44 PM  6CIT Screen  What Year? 0 points  What month? 0 points  What time? 0 points  Count back from 20 0 points  Months in reverse 0 points  Repeat phrase 0 points  Total Score 0 points    Immunizations Immunization History  Administered Date(s) Administered   Influenza Split 04/07/2012   Influenza Whole 04/01/2007, 04/22/2008, 05/04/2010, 03/14/2011   Influenza, High Dose  Seasonal PF 04/27/2014, 04/21/2015   Influenza, Quadrivalent, Recombinant, Inj, Pf 03/20/2018, 03/26/2019   Influenza-Unspecified 03/31/2012, 04/23/2013, 04/14/2021   PFIZER(Purple Top)SARS-COV-2 Vaccination 08/16/2019, 09/08/2019, 04/03/2020   Pneumococcal Conjugate-13 11/16/2013   Pneumococcal Polysaccharide-23 01/01/2006, 04/01/2007, 10/25/2021   Td 09/25/1998, 02/21/2010   Tdap 05/19/2013   Zoster Recombinat (Shingrix) 02/08/2021   Zoster, Live 05/07/2011    TDAP status: Up to date  Flu Vaccine status: Due, Education has been provided regarding the importance of this vaccine. Advised may receive this vaccine  at local pharmacy or Health Dept. Aware to provide a copy of the vaccination record if obtained from local pharmacy or Health Dept. Verbalized acceptance and understanding.  Pneumococcal vaccine status: Up to date  Covid-19 vaccine status: Information provided on how to obtain vaccines.   Qualifies for Shingles Vaccine? Yes   Zostavax completed No   Shingrix Completed?: No.    Education has been provided regarding the importance of this vaccine. Patient has been advised to call insurance company to determine out of pocket expense if they have not yet received this vaccine. Advised may also receive vaccine at local pharmacy or Health Dept. Verbalized acceptance and understanding.  Screening Tests Health Maintenance  Topic Date Due   Hepatitis C Screening  Never done   COVID-19 Vaccine (4 - Pfizer risk series) 05/29/2020   Zoster Vaccines- Shingrix (2 of 2) 04/05/2021   INFLUENZA VACCINE  10/06/2022 (Originally 02/05/2022)   TETANUS/TDAP  05/20/2023   MAMMOGRAM  11/03/2023   COLONOSCOPY (Pts 45-4yr Insurance coverage will need to be confirmed)  12/26/2026   Pneumonia Vaccine 76 Years old  Completed   DEXA SCAN  Completed   HPV VACCINES  Aged Out    Health Maintenance  Health Maintenance Due  Topic Date Due   Hepatitis C Screening  Never done   COVID-19 Vaccine (4 -  [place]) 05/29/2020   Zoster Vaccines- Shingrix (2 of 2) 04/05/2021    Colorectal cancer screening: Type of screening: Colonoscopy. Completed 12/25/21. Repeat every 5 years  Mammogram status: Completed 11/02/21. Repeat every year  Bone Density status: Completed 12/25/21. Results reflect: Bone density results: NORMAL. Repeat every 2 years.  Lung Cancer Screening: (Low Dose CT Chest recommended if Age 74-80years, 30 pack-year currently smoking OR have quit w/in 15years.) does not qualify.   Additional Screening:  Hepatitis C Screening: does qualify; Completed not performed education provided  Vision Screening: Recommended annual ophthalmology exams for early detection of glaucoma and other disorders of the eye. Is the patient up to date with their annual eye exam?  Yes  Who is the provider or what is the name of the office in which the patient attends annual eye exams? Nurse did not ask question. If pt is not established with a provider, would they like to be referred to a provider to establish care?  N/A .   Dental Screening: Recommended annual dental exams for proper oral hygiene  Community Resource Referral / Chronic Care Management: CRR required this visit?  No   CCM required this visit?  No      Plan:     I have personally reviewed and noted the following in the patient's chart:   Medical and social history Use of alcohol, tobacco or illicit drugs  Current medications and supplements including opioid prescriptions. Patient is not currently taking opioid prescriptions. Functional ability and status Nutritional status Physical activity Advanced directives List of other physicians Hospitalizations, surgeries, and ER visits in previous 12 months Vitals Screenings to include cognitive, depression, and falls Referrals and appointments  In addition, I have reviewed and discussed with patient certain preventive protocols, quality metrics, and best practice  recommendations. A written personalized care plan for preventive services as well as general preventive health recommendations were provided to patient.     JMichiel Cowboy RN   04/02/2022   Nurse Notes:  Ms. MElwell, Thank you for taking time to come for your Medicare Wellness Visit. I appreciate your ongoing commitment to your health goals.  Please review the following plan we discussed and let me know if I can assist you in the future.   These are the goals we discussed:  Goals      Patient Stated     I would like to lose weight.        This is a list of the screening recommended for you and due dates:  Health Maintenance  Topic Date Due   Hepatitis C Screening: USPSTF Recommendation to screen - Ages 42-79 yo.  Never done   COVID-19 Vaccine (4 - Pfizer risk series) 05/29/2020   Zoster (Shingles) Vaccine (2 of 2) 04/05/2021   Flu Shot  10/06/2022*   Tetanus Vaccine  05/20/2023   Mammogram  11/03/2023   Colon Cancer Screening  12/26/2026   Pneumonia Vaccine  Completed   DEXA scan (bone density measurement)  Completed   HPV Vaccine  Aged Out  *Topic was postponed. The date shown is not the original due date.

## 2022-04-02 ENCOUNTER — Ambulatory Visit (INDEPENDENT_AMBULATORY_CARE_PROVIDER_SITE_OTHER): Payer: Medicare Other | Admitting: *Deleted

## 2022-04-02 DIAGNOSIS — Z0000 Encounter for general adult medical examination without abnormal findings: Secondary | ICD-10-CM

## 2022-04-03 NOTE — Patient Instructions (Signed)
Below is our plan:  We will continue clonazepam 0.52m daily at bedtime. We will try to get you with a different DME to help with managing CPAP machine and supplies. We will see if they can help with troubleshooting difficulty you are having with new machine.   Please continue using your CPAP regularly. While your insurance requires that you use CPAP at least 4 hours each night on 70% of the nights, I recommend, that you not skip any nights and use it throughout the night if you can. Getting used to CPAP and staying with the treatment long term does take time and patience and discipline. Untreated obstructive sleep apnea when it is moderate to severe can have an adverse impact on cardiovascular health and raise her risk for heart disease, arrhythmias, hypertension, congestive heart failure, stroke and diabetes. Untreated obstructive sleep apnea causes sleep disruption, nonrestorative sleep, and sleep deprivation. This can have an impact on your day to day functioning and cause daytime sleepiness and impairment of cognitive function, memory loss, mood disturbance, and problems focussing. Using CPAP regularly can improve these symptoms.  Please make sure you are staying well hydrated. I recommend 50-60 ounces daily. Well balanced diet and regular exercise encouraged. Consistent sleep schedule with 6-8 hours recommended.   Please continue follow up with care team as directed.   Follow up with me in 6 months   You may receive a survey regarding today's visit. I encourage you to leave honest feed back as I do use this information to improve patient care. Thank you for seeing me today!

## 2022-04-03 NOTE — Progress Notes (Unsigned)
PATIENT: Jamie [name] DOB: 03-24-1946  REASON FOR VISIT: follow up HISTORY FROM: patient  No chief complaint on file.    HISTORY OF PRESENT ILLNESS:  04/04/2022 ALL:  Jamie [name] returns for follow up for OSA on CPAP, migraines and RLS. She was last seen by me 02/2021 and doing well. She was seen for consult of RLS with Jamie [name] 09/2021. PCP had previously written clonazepam and skelaxin but was no longer willing to fill meds. Jamie [name] agreed to continue low dose clonazepam 0.51m daily at bedtime for management of RLS. Since,   RLS is . Skelaxin 4079mand pramipexole 0.2588mas continued through PCP???  She continues Nurtec as needed for migraine abortion.   10/02/2021 CD: This female [age]ar-old  patient is now referred for a problem of RLS, was for year treated by PCP with skelaxin and klonopin. She reports her PCP refuses to reorder these medications and she has not found any other effective medication for this problem, staying awake due to RLS . She is established as an apnea patient [facility] and followed yearly with OSA on CPAP.  Patient presents today for RLS and  irresistible urge to move in both legs- rarely having bilateral leg pain. She said that she woke with cramps in her legs in December. Left leg pain comes form spinal stenosis, has had epidural injections which allowed temporary relief.  She had a difficult time getting her cramps to resolve. She said that both legs were very sore afterwards. Since the cramps, she states that her legs ache. She has a history of restless leg syndrome. No lower back or groin pain. Has been diagnosed with restless leg syndrome in the past by her primary care physician and treated on Klonopin and Skelaxin which did provide some help and allows her to go to sleep-.  She is not had any injury or trauma.  Does not really have any numbness or tingling just creepy crawly.  Has no history of diabetes.  Does not have fibromyalgia. Not  on a statin drug.   Takes benadryl , which can exacerbate  RLS.   She recently has been seeing a new PCP Jamie. CraSharlet Salinao changed medications_ Cannot tolerate trazodone and reports RLS worse on Mirapex.- both were  ordered by Jamie [name]. Induced twitching.    Certainly not an easy case.  Check iron level and HBaic.   High calcium- parathyroid .   02/08/2021 ALL: Jamie Shadowturns for follow up for OSA on CPAP. She is doing very well. She uses CPAP every night for about 10 hours a night. She denies concerns with machine or supplies. She does hear some whistling from time to time. She uses a small full face mask. She recently took Pavloxid for Covid 19. She recovered well and denies lingering symptoms.      02/09/2020 ALL:  Jamie [name] a 76 53o. female here today for follow up for OSA on CPAP and headaches. Nurtec was started in June for abortive therapy.  She reports that headaches are well managed.  Nurtec has worked well.  She continues CPAP therapy.  She did receive her new machine last year, however, she reports that it makes an unusual whistling sound.  She has not used her new CPAP and continues to use her old machine.  Unfortunately, we do not have a compliance report to review for today's visit.  She states that she is using CPAP every night.  She  denies any concerns with her old machine with the exception of having to restart CPAP from time to time.  She notes significant benefit of using CPAP therapy.  HISTORY: (copied from my note on 02/09/2019)  Jamie [name] is a 76 y.o. female here today for follow up for OSA on CPAP. She is doing well with compliance. She is concerned that her machine is not working correctly. She reports that when she tries to power up the machine, it may take 2-3 tries.  She is concerned that eventually machine will completely give out.  Last sleep study was in 2012.  She feels that her CPAP is approximately that old.  She is using CPAP nightly.   She reports that she cannot sleep without it.     HISTORY: (copied from Jamie [name] note on 01/19/2018)   (CD)Is and is here today to be evaluated for a headache, and she reports that she first had migrainous headaches when she resumed birth control pills over 40 years ago. The headaches were very debilitating at the time but there were infrequent through the 1970s. Once she discontinued hormonal contraception her headaches became as infrequent as once a year. In the early 1980s the couple lived in [place] and Jamie [name] recalls that the bright light was a trigger for headaches -there was a strong photophobic component, and she made many trips to the local hospital for Demerol shots. She underwent a self hypnosis treatment and this worked well, controlled the migraine for years. On 9-20 02-2013 the patient woke up with a severe explosive kind of pain that seemed to arise from the right eye. The pain was centered retro-orbital and radiated backwards into the center of the head. The patient tried aspirin and Motrin with no benefit at the time as my colleague Jamie. Janann Colonel D.O. did document.  This was different from her typical migraines. She was extremely nauseated but she could not vomit and developed dry heaves. The headache broker out of sound sleep at about about 3 AM. She had no tearing of the eye ( that she could remember),  the nose was not runny and her face was not flushe.She was asleep with her CPAP.   The patient had an eye exam in 2013 which was reportedly normal at that time and her headaches were evaluated with a brain MRI which was also reviewed and found unremarkable. Not had a number of the severe headache spell since. On 09-12-14 the patient again developed a headache this time of a new quality. She was watching TV at about 11 PM she noticed that the right vision was fluctuating she states that her vision was disturbed by changing in light and dark sensation, that she describedas if it  checker board was placed in front of her eyes. By 11.45 , she tried to answer an e mail, couldn't decipher the message, non-sensical to her. She switched and tried to read aloud, she couldn't , her speech output was not correlated to the text she read. Jibberish, while her normal speech was unaffected. On the left side this time by migraines have always attack the right side of her head. This was unusual as well. The entire episode lasted less than one hour. The emergency room obtained a CT head , non contrast, read as normal and reviewed here in imaging file. Her BP was very high.   She sleeps with a CPAP, and likes it very much, she had nocturia before being diagnosed with OSA.   Her  pneumonia related hospitalization in 2007 was the cause for the test by [facility], as her RN had witnessed her sleep apnea. Jamie .Elsworth Soho follows her, Jamie Constance Holster is her ENT.     Interval history from 03/27/2017CD. Mrs. Gruver is doing very well she has had one of the severe headaches she used to describe in the past, -onset at about 5:30 after working on the computer. She developed a severe right-sided headache. Afraid this could be another TIA I  have recommended her to go to the emergency room, she was given morphine for the headaches which then caused another reaction and side effects. She did not have any speech arrest with this one. The emergency room did not find anything abnormal, she underwent a CT scan of the brain, which was normal, MRI brain  was normal. I'm also able today to just review a download of her CPAP machine which has been followed by Jamie. [employer] but not in the last 3 years. She has 100% compliance for days 100% compliance for over 4 hours of daily use, average user time is 9 hours 7 minutes set pressure is 12 cm water no EPR is used residual AHI is 0.8. Excellent result. Her obesity was the same. No weight loss.  She was given a prescrition of Fioricet and ondansetron also known as Zofran. She's not supposed to take  the medication before the headache component has started.    UPDATE 03/14/2018CM Ms. Storck, 76 year old female returns for follow-up.She has not had further stroke or TIA episodes. She denies any recent falls  She had a couple episodes of blurred vision without headache. She has not had any speech arrest, weakness on one side or the other etc. She continues to use her CPAP every night, however she took that she about 2 weeks ago and we are unable to get a download. She remains on enteric-coated aspirin. She continues to get little exercise due to her chronic vertigo. Her transcranial Dopplers showed minimal PFO not likely to be clinically significant. She has been evaluated at [facility] for her chronic vertigo. Her husband reports several instances since last seen when she fell asleep at the computer without her CPAP and stopped breathing. Once he got oxygen to her she woke up and was fine.  She returns for reevaluation.   UPDATE 06/14/2018CM Ms. Jessie, 76 year old female returns for follow-up she has a history of TIA and is currently on aspirin for secondary stroke prevention. Blood pressure in the office today 105/65. She continues to take at home and generally in the 024 systolic range. Patient was seen in the emergency room on 11/07/2016 for headache in the left unilateral region the pain did not radiate no nausea and vomiting. She had some visual blurring and has a history of similar headaches She was given Reglan and Benadryl. She is having maybe 2 headaches not enough for a preventive medication. She says prescription strength Motrin takes the headache away worst part is the nausea for which she has nothing to take. She denies any speech arrest weakness on one side or the other. She continues to be compliant with her CPAP download was obtained today. Greater than 4 hours use 97% or 29 days. Average usage not hours 13 minutes at 12 cm pressure. AHI 0.8 She returns for reevaluation   04/22/17 CD I  have the pleasure of seeing Mrs. Nilson today on 04/22/2017, and a routine follow up for CPAP compliance. She has done excellently with her CPAP machine which is  set at 12 cm water pressure without expiratory pressure relief. Her AHI is 0.7 with an average user time of 9 hours and 2 minutes each night. She had a 97% compliance, power was lost first a night due to [employer]. [employer]- will ask for a battery back up- Linzie Collin, RPSGT.   UPDATE 7/15/2019CM Ms. Casino, 76 year old female returns for follow-up for CPAP compliance.  She had a motor change in her machine.  Compliance data dated 12/20/2017 01/18/2018 shows compliance greater than 4 hours at 100%.  Average usage 9 hours 22 minutes.  Set pressure 12 cm EPR level 1.  no leak or AHI data recorded on the report.  ESS 2 she returns for reevaluation   REVIEW OF SYSTEMS: Out of a complete 14 system review of symptoms, the patient complains only of the following symptoms, headaches and all other reviewed systems are negative.    ALLERGIES: Allergies  Allergen Reactions   Buprenorphine Hcl Rash and Shortness Of Breath   Morphine And Related Shortness Of Breath and Rash   Morphine And Related Shortness Of Breath   Aspirin Other (See Comments)    REACTION: ulcers  Can take 81 mg   Avelox [Moxifloxacin Hcl In Nacl] Hives   Diflucan [Fluconazole] Hives    Whelps and rash c skin peeling   Diflucan [Fluconazole] Hives   Hydrochlorothiazide Other (See Comments)   Latex Rash   Moxifloxacin Palpitations    Tachycardia     HOME MEDICATIONS: Outpatient Medications Prior to Visit  Medication Sig Dispense Refill   ALPHA-LIPOIC ACID PO Take 1,200 mg by mouth daily at 2 PM.     AMBULATORY NON FORMULARY MEDICATION Medication Name: Multi Collagen Protein once daily     aspirin EC 81 MG tablet Take 1 tablet (81 mg total) by mouth daily. 90 tablet 3   BIOTIN PO Take 1 capsule by mouth daily.      clonazePAM (KLONOPIN) 0.5 MG tablet  Take 1 tablet (0.5 mg total) by mouth at bedtime. Take one at bedtime po 90 tablet 1   conjugated estrogens (PREMARIN) vaginal cream Place 1 Applicatorful vaginally every other day. 42.5 g 12   esomeprazole (NEXIUM) 40 MG capsule TAKE 1 CAPSULE TWICE A DAY BEFORE MEALS 180 capsule 3   famotidine (PEPCID) 20 MG tablet Take 20 mg by mouth 2 (two) times daily.     famotidine (PEPCID) 20 MG tablet Take 1 tablet (20 mg total) by mouth 2 (two) times daily. 90 tablet 3   GLUCOSAMINE-CHONDROITIN PO Take 1 tablet by mouth daily.     LIBRAX 5-2.5 MG capsule TAKE 1 CAPSULE TWICE A DAY 180 capsule 3   linaclotide (LINZESS) 290 MCG CAPS capsule TAKE 1 CAPSULE DAILY BEFORE BREAKFAST 90 capsule 3   Magnesium 250 MG TABS Take 1 tablet by mouth daily at 2 PM.     metaxalone (SKELAXIN) 400 MG tablet Take 1 tablet (400 mg total) by mouth at bedtime. 90 tablet 3   metFORMIN (GLUCOPHAGE) 500 MG tablet Take 1 tablet (500 mg total) by mouth daily with breakfast. 90 tablet 3   Omega-3-6-9 CAPS Take 1 capsule by mouth every evening.     pramipexole (MIRAPEX) 0.25 MG tablet Take 1 tablet (0.25 mg total) by mouth at bedtime. (Patient not taking: Reported on 02/04/2022) 30 tablet 3   Prenatal Vit-Fe Fumarate-FA (PRENATAL MULTIVITAMIN) TABS tablet Take 1 tablet by mouth every evening.      Probiotic Product (PROBIOTIC-10 PO) Take 1 capsule by mouth  2 (two) times daily.      Rimegepant Sulfate (NURTEC) 75 MG TBDP Take 75 mg by mouth daily as needed (take for abortive therapy of migraine, no more than 1 tablet in 24 hours or 8 per month). 8 tablet 11   spironolactone (ALDACTONE) 50 MG tablet Take 1 tablet (50 mg total) by mouth daily. 90 tablet 3   TURMERIC PO Take 1 tablet by mouth daily.     vitamin C (ASCORBIC ACID) 500 MG tablet Take 500 mg by mouth daily.     Vitamin D-Vitamin K (VITAMIN K2-VITAMIN D3 PO) Take 1 tablet by mouth daily.     No facility-administered medications prior to visit.    PAST MEDICAL  HISTORY: Past Medical History:  Diagnosis Date   Allergic rhinitis    Anxiety    Arthritis    left foot   Asthma    Back pain    Blood transfusion without reported diagnosis    Colon polyps    adenomatous   Constipation    Diverticulosis    Dysrhythmia 1999   Jamie Johnsie Cancel 2003   Elevated LFTs    Fatty liver disease, nonalcoholic    GERD (gastroesophageal reflux disease)    Headache(784.0)    migraines with vision changes but no pain   Heart murmur    Hypertension    IBS (irritable bowel syndrome)    Joint pain    Migraines    Muscle cramps    Neuromuscular disorder (HCC)    RLS   Obesity    Palpitations    Pneumonia 2007   Renal cell carcinoma ([place]) 2013   minimal partial nephrectomy   Restless leg syndrome    Sleep apnea    moderate; wears CPAP, uncertain about setting   TIA (transient ischemic attack) 09/11/2014   Ulcer    Urinary incontinence    Vitamin D deficiency     PAST SURGICAL HISTORY: Past Surgical History:  Procedure Laterality Date   BREAST REDUCTION SURGERY  Jan 1991   ELBOW SURGERY Left    KIDNEY SURGERY Right October 2013    Tumor removed    LAPAROSCOPIC APPENDECTOMY N/A 06/08/2018   Procedure: APPENDECTOMY LAPAROSCOPIC;  Surgeon: Jovita Kussmaul, MD;  Location: [facility];  Service: General;  Laterality: N/A;   TONSILLECTOMY     VAGINAL HYSTERECTOMY  1979   partial    FAMILY HISTORY: Family History  Problem Relation Age of Onset   Hypertension Mother    Anxiety disorder Mother    Heart disease Father 26   Lung cancer Father        smoker   Lung cancer Sister    Heart disease Brother    Allergies Brother    Allergies Daughter    Melanoma Daughter    Colon cancer Cousin    Hypertension Other    Liver disease Neg Hx    Asthma Neg Hx    Esophageal cancer Neg Hx    Rectal cancer Neg Hx    Stomach cancer Neg Hx    Liver cancer Neg Hx    Prostate cancer Neg Hx    Pancreatic cancer Neg Hx     SOCIAL HISTORY: Social History    Socioeconomic History   Marital status: Married    Spouse name: Sallyanne Kuster   Number of children: 2   Years of education: 15   Highest education level: Not on file  Occupational History   Occupation: Retired    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   Retired Personal assistant.   Education- college   Patient is right handed.   Patient drinks tea a lot and water.   Social Determinants of Health   Financial Resource Strain: Low Risk  (04/02/2022)   Overall Financial Resource Strain (CARDIA)    Difficulty of Paying Living Expenses: Not hard at all  Food Insecurity: No Food Insecurity (04/02/2022)   Hunger Vital Sign    Worried About Running Out of Food in the Last Year: Never true    Ran Out of Food in the Last Year: Never true  Transportation Needs: No Transportation Needs (04/02/2022)   PRAPARE - Hydrologist (Medical): No    Lack of Transportation (Non-Medical): No  Physical Activity: Insufficiently Active (04/02/2022)   Exercise Vital Sign    Days of Exercise per Week: 4 days    Minutes of Exercise per Session: 20 min  Stress: No Stress Concern Present (04/02/2022)   [place]    Feeling of Stress : Not at all  Social Connections: [place] (04/02/2022)   Social Connection and Isolation Panel [NHANES]    Frequency of Communication with Friends and Family: More than three times a week     Frequency of Social Gatherings with Friends and Family: Three times a week    Attends Religious Services: More than 4 times per year    Active Member of Clubs or Organizations: Yes    Attends Archivist Meetings: More than 4 times per year    Marital Status: Married  Human resources officer Violence: Not At Risk (04/02/2022)   Humiliation, Afraid, Rape, and Kick questionnaire    Fear of Current or Ex-Partner: No    Emotionally Abused: No    Physically Abused: No    Sexually Abused: No      PHYSICAL EXAM  There were no vitals filed for this visit.   There is no height or weight on file to calculate BMI.  Generalized: Well developed, in no acute distress  Cardiology: normal rate and rhythm, no murmur noted Respiratory: clear to auscultation bilaterally  Neurological examination  Mentation: Alert oriented to time, place, history taking. Follows all commands speech and language fluent Cranial nerve II-XII: Pupils were equal round reactive to light. Extraocular movements were full, visual field were full  Motor: The motor testing reveals 5 over 5 strength of all 4 extremities. Good symmetric motor tone is noted throughout.  Gait and station: Gait is normal.   DIAGNOSTIC DATA (LABS, IMAGING, TESTING) - I reviewed patient records, labs, notes, testing and imaging myself where available.      No data to display           Lab Results  Component Value Date   WBC 6.1 10/18/2019   HGB 14.4 10/18/2019   HCT 42.0 10/18/2019  MCV 93 10/18/2019   PLT 226 10/18/2019      Component Value Date/Time   NA 137 09/06/2021 1531   NA 142 10/18/2019 1228   K 4.6 09/06/2021 1531   CL 102 09/06/2021 1531   CO2 28 09/06/2021 1531   GLUCOSE 95 09/06/2021 1531   BUN 17 09/06/2021 1531   BUN 19 10/18/2019 1228   CREATININE 1.05 09/06/2021 1531   CALCIUM 10.5 09/06/2021 1531   PROT 7.5 10/18/2019 1228   ALBUMIN 5.0 (H) 10/18/2019 1228   AST 23 10/18/2019 1228   ALT 25 10/18/2019  1228   ALKPHOS 64 10/18/2019 1228   BILITOT 0.3 10/18/2019 1228   GFRNONAA 60 10/18/2019 1228   GFRAA 70 10/18/2019 1228   Lab Results  Component Value Date   CHOL 176 08/25/2017   HDL 84 08/25/2017   LDLCALC 74 08/25/2017   TRIG 91 08/25/2017   CHOLHDL 3 08/24/2010   Lab Results  Component Value Date   HGBA1C 5.0 10/18/2019   Lab Results  Component Value Date   VITAMINB12 647 05/03/2011   Lab Results  Component Value Date   TSH 1.900 08/25/2017       ASSESSMENT AND PLAN 76 y.o. year old female  has a past medical history of Allergic rhinitis, Anxiety, Arthritis, Asthma, Back pain, Blood transfusion without reported diagnosis, Colon polyps, Constipation, Diverticulosis, Dysrhythmia (1999), Elevated LFTs, Fatty liver disease, nonalcoholic, GERD (gastroesophageal reflux disease), Headache(784.0), Heart murmur, Hypertension, IBS (irritable bowel syndrome), Joint pain, Migraines, Muscle cramps, Neuromuscular disorder ([place]), Obesity, Palpitations, Pneumonia (2007), Renal cell carcinoma ([place]) (2013), Restless leg syndrome, Sleep apnea, TIA (transient ischemic attack) (09/11/2014), Ulcer, Urinary incontinence, and Vitamin D deficiency. here with   No diagnosis found.    Jamie [name] is doing very well on CPAP. She has adjusted to her new machine. Compliance report shows excellent compliance. I have encouraged her to monitor for air leak. May consider mask refitting if needed. AHI is well managed.  She will continue Nurtec as needed for abortive therapy.  Adequate hydration and healthy lifestyle habits encouraged.  She will follow-up with me in 1 year, sooner if needed.  She verbalizes understanding and agreement with this plan.   No orders of the defined types were placed in this encounter.     No orders of the defined types were placed in this encounter.      Debbora Presto, FNP-C 04/03/2022, 4:12 PM [facility] [address], [place], [place] [address] [phone]

## 2022-04-04 ENCOUNTER — Encounter: Payer: Self-pay | Admitting: Family Medicine

## 2022-04-04 ENCOUNTER — Other Ambulatory Visit: Payer: Self-pay | Admitting: Neurology

## 2022-04-04 ENCOUNTER — Ambulatory Visit (INDEPENDENT_AMBULATORY_CARE_PROVIDER_SITE_OTHER): Payer: Medicare Other | Admitting: Family Medicine

## 2022-04-04 VITALS — BP 139/88 | HR 82 | Ht 62.5 in | Wt 191.0 lb

## 2022-04-04 DIAGNOSIS — G4733 Obstructive sleep apnea (adult) (pediatric): Secondary | ICD-10-CM

## 2022-04-04 DIAGNOSIS — Z9989 Dependence on other enabling machines and devices: Secondary | ICD-10-CM

## 2022-04-04 DIAGNOSIS — G2581 Restless legs syndrome: Secondary | ICD-10-CM | POA: Diagnosis not present

## 2022-04-04 DIAGNOSIS — G43709 Chronic migraine without aura, not intractable, without status migrainosus: Secondary | ICD-10-CM

## 2022-04-12 ENCOUNTER — Encounter: Payer: Self-pay | Admitting: Internal Medicine

## 2022-04-15 ENCOUNTER — Other Ambulatory Visit (HOSPITAL_COMMUNITY): Payer: Self-pay

## 2022-04-15 NOTE — Telephone Encounter (Signed)
Called [facility]. PA is not required. This medication is currently on back order with no release date. If patient is able to find at her local pharmacy, she can get a short term supply per [facility].

## 2022-05-03 DIAGNOSIS — N390 Urinary tract infection, site not specified: Secondary | ICD-10-CM | POA: Diagnosis not present

## 2022-05-06 ENCOUNTER — Other Ambulatory Visit: Payer: Self-pay | Admitting: Family Medicine

## 2022-05-06 MED ORDER — CLONAZEPAM 0.5 MG PO TABS: 0.5000 mg | ORAL_TABLET | Freq: Every day | ORAL | 1 refills | Status: DC

## 2022-05-06 NOTE — Telephone Encounter (Signed)
Pt is calling. Requesting a refill on medication clonazePAM (KLONOPIN) 0.5 MG tablet. Refill should be sent to [place].

## 2022-05-20 DIAGNOSIS — D3132 Benign neoplasm of left choroid: Secondary | ICD-10-CM | POA: Diagnosis not present

## 2022-05-20 DIAGNOSIS — H2513 Age-related nuclear cataract, bilateral: Secondary | ICD-10-CM | POA: Diagnosis not present

## 2022-05-20 DIAGNOSIS — H1013 Acute atopic conjunctivitis, bilateral: Secondary | ICD-10-CM | POA: Diagnosis not present

## 2022-05-31 ENCOUNTER — Telehealth: Payer: Self-pay | Admitting: Physician Assistant

## 2022-05-31 NOTE — Telephone Encounter (Signed)
Thanks Amy!

## 2022-05-31 NOTE — Telephone Encounter (Signed)
Patient known to Dr. Henrene Pastor, called today with complaints of suprapubic and lower abdominal pain onset on Wednesday, 05/29/2022 She has history of diverticulitis, says initially she was not certain whether this was diverticulitis but today has persistent pain, some increased pain with movement etc. no fever or chills no nausea or vomiting.  Had put herself on clears. She does not feel that she needs to come to the emergency room, but is asking for antibiotics.  Review of chart shows she has been treated with Augmentin successfully in the past  , Advised to gradually advance her diet over the next few days, stay well-hydrated Advised to call if she has any worsening in symptoms over the next few days or if she has not significantly improved by Monday/Tuesday which point she would need imaging  Have called Augmentin [phone] 1 p.o. twice daily x10 days to [facility]

## 2022-06-25 ENCOUNTER — Encounter: Payer: Self-pay | Admitting: Internal Medicine

## 2022-06-25 ENCOUNTER — Ambulatory Visit (INDEPENDENT_AMBULATORY_CARE_PROVIDER_SITE_OTHER): Payer: Medicare Other | Admitting: Internal Medicine

## 2022-06-25 VITALS — BP 100/80 | HR 83 | Temp 98.3°F | Ht 62.5 in | Wt 192.0 lb

## 2022-06-25 DIAGNOSIS — K760 Fatty (change of) liver, not elsewhere classified: Secondary | ICD-10-CM | POA: Diagnosis not present

## 2022-06-25 DIAGNOSIS — I10 Essential (primary) hypertension: Secondary | ICD-10-CM | POA: Diagnosis not present

## 2022-06-25 DIAGNOSIS — R7303 Prediabetes: Secondary | ICD-10-CM | POA: Diagnosis not present

## 2022-06-25 DIAGNOSIS — J3089 Other allergic rhinitis: Secondary | ICD-10-CM

## 2022-06-25 LAB — LIPID PANEL
Cholesterol: 187 mg/dL (ref 0–200)
HDL: 70.2 mg/dL (ref >39.00)
LDL Cholesterol: 98 mg/dL (ref 0–99)
NonHDL: 116.87
Total CHOL/HDL Ratio: 3
Triglycerides: 95 mg/dL (ref 0.0–149.0)
VLDL: 19 mg/dL (ref 0.0–40.0)

## 2022-06-25 LAB — CBC
HCT: 38.4 % (ref 36.0–46.0)
Hemoglobin: 13 g/dL (ref 12.0–15.0)
MCHC: 33.8 g/dL (ref 30.0–36.0)
MCV: 92.1 fl (ref 78.0–100.0)
Platelets: 251 10*3/uL (ref 150.0–400.0)
RBC: 4.16 Mil/uL (ref 3.87–5.11)
RDW: 12.7 % (ref 11.5–15.5)
WBC: 4.2 10*3/uL (ref 4.0–10.5)

## 2022-06-25 LAB — HEMOGLOBIN A1C: Hgb A1c MFr Bld: 5.4 % (ref 4.6–6.5)

## 2022-06-25 LAB — COMPREHENSIVE METABOLIC PANEL
ALT: 40 U/L — ABNORMAL HIGH (ref 0–35)
AST: 23 U/L (ref 0–37)
Albumin: 4.5 g/dL (ref 3.5–5.2)
Alkaline Phosphatase: 62 U/L (ref 39–117)
BUN: 19 mg/dL (ref 6–23)
CO2: 25 mEq/L (ref 19–32)
Calcium: 10.3 mg/dL (ref 8.4–10.5)
Chloride: 106 mEq/L (ref 96–112)
Creatinine, Ser: 0.83 mg/dL (ref 0.40–1.20)
GFR: 69.42 mL/min (ref >60.00)
Glucose, Bld: 115 mg/dL — ABNORMAL HIGH (ref 70–99)
Potassium: 4.3 mEq/L (ref 3.5–5.1)
Sodium: 137 mEq/L (ref 135–145)
Total Bilirubin: 0.3 mg/dL (ref 0.2–1.2)
Total Protein: 7 g/dL (ref 6.0–8.3)

## 2022-06-25 NOTE — Assessment & Plan Note (Signed)
With flare today and taking allergy medication. Discussed sinus rinse. In most cases antibiotics are not indicated before 2-3 weeks of symptoms.

## 2022-06-25 NOTE — Assessment & Plan Note (Signed)
Checking CMP and CBC and lipid panel. Adjust as needed. BP at goal with spironolactone 50 mg daily.

## 2022-06-25 NOTE — Patient Instructions (Addendum)
We will check the labs today. 

## 2022-06-25 NOTE — Assessment & Plan Note (Signed)
Checking HgA1c and adjust metformin 500 mg daily as needed.

## 2022-06-25 NOTE — Assessment & Plan Note (Signed)
Checking CMP and counseled about diet and exercise.

## 2022-06-25 NOTE — Progress Notes (Signed)
   Subjective:   Patient ID: Jamie [name], female    DOB: 1946/05/31, 76 y.o.   MRN: [id number]  HPI The patient is a 76 YO female coming in for follow up.  Review of Systems  Constitutional: Negative.   HENT: Negative.    Eyes: Negative.   Respiratory:  Negative for cough, chest tightness and shortness of breath.   Cardiovascular:  Negative for chest pain, palpitations and leg swelling.  Gastrointestinal:  Negative for abdominal distention, abdominal pain, constipation, diarrhea, nausea and vomiting.  Musculoskeletal: Negative.   Skin: Negative.   Neurological: Negative.   Psychiatric/Behavioral: Negative.      Objective:  Physical Exam Constitutional:      Appearance: She is well-developed.  HENT:     Head: Normocephalic and atraumatic.  Cardiovascular:     Rate and Rhythm: Normal rate and regular rhythm.  Pulmonary:     Effort: Pulmonary effort is normal. No respiratory distress.     Breath sounds: Normal breath sounds. No wheezing or rales.  Abdominal:     General: Bowel sounds are normal. There is no distension.     Palpations: Abdomen is soft.     Tenderness: There is no abdominal tenderness. There is no rebound.  Musculoskeletal:     Cervical back: Normal range of motion.  Skin:    General: Skin is warm and dry.  Neurological:     Mental Status: She is alert and oriented to person, place, and time.     Coordination: Coordination normal.     Vitals:   06/25/22 1302  BP: 100/80  Pulse: 83  Temp: 98.3 F (36.8 C)  TempSrc: Oral  SpO2: 93%  Weight: 192 lb (87.1 kg)  Height: 5' 2.5" (1.588 m)    Assessment & Plan:

## 2022-06-28 DIAGNOSIS — R059 Cough, unspecified: Secondary | ICD-10-CM | POA: Diagnosis not present

## 2022-06-28 DIAGNOSIS — J0100 Acute maxillary sinusitis, unspecified: Secondary | ICD-10-CM | POA: Diagnosis not present

## 2022-06-28 DIAGNOSIS — R0982 Postnasal drip: Secondary | ICD-10-CM | POA: Diagnosis not present

## 2022-07-12 ENCOUNTER — Other Ambulatory Visit: Payer: Self-pay | Admitting: Internal Medicine

## 2022-07-12 DIAGNOSIS — E88819 Insulin resistance, unspecified: Secondary | ICD-10-CM

## 2022-07-22 DIAGNOSIS — L245 Irritant contact dermatitis due to other chemical products: Secondary | ICD-10-CM | POA: Diagnosis not present

## 2022-07-22 DIAGNOSIS — L821 Other seborrheic keratosis: Secondary | ICD-10-CM | POA: Diagnosis not present

## 2022-07-22 DIAGNOSIS — L738 Other specified follicular disorders: Secondary | ICD-10-CM | POA: Diagnosis not present

## 2022-07-22 DIAGNOSIS — D1801 Hemangioma of skin and subcutaneous tissue: Secondary | ICD-10-CM | POA: Diagnosis not present

## 2022-08-01 ENCOUNTER — Telehealth: Payer: Self-pay | Admitting: *Deleted

## 2022-08-01 NOTE — Telephone Encounter (Signed)
Can you please call pt and ask her to call [facility] back about her CPAP at [phone]? Thank you!

## 2022-08-01 NOTE — Telephone Encounter (Signed)
Noted  

## 2022-08-01 NOTE — Telephone Encounter (Signed)
Received email from Jamie [name] stating they have made multiple attempts to schedule the patient but she has not returned their calls.

## 2022-08-01 NOTE — Telephone Encounter (Signed)
Called Jamie [name]. Informed her of message nurse [facility] sent. Jamie [name] said she will try calling them again.

## 2022-10-03 ENCOUNTER — Encounter: Payer: Self-pay | Admitting: Internal Medicine

## 2022-10-03 ENCOUNTER — Ambulatory Visit (INDEPENDENT_AMBULATORY_CARE_PROVIDER_SITE_OTHER): Payer: Medicare Other | Admitting: Internal Medicine

## 2022-10-03 VITALS — BP 118/68 | HR 75 | Ht 62.0 in | Wt 197.0 lb

## 2022-10-03 DIAGNOSIS — K219 Gastro-esophageal reflux disease without esophagitis: Secondary | ICD-10-CM

## 2022-10-03 DIAGNOSIS — K589 Irritable bowel syndrome without diarrhea: Secondary | ICD-10-CM

## 2022-10-03 MED ORDER — LINACLOTIDE 290 MCG PO CAPS: ORAL_CAPSULE | ORAL | 3 refills | Status: DC

## 2022-10-03 MED ORDER — CILIDINIUM-CHLORDIAZEPOXIDE 2.5-5 MG PO CAPS: 1.0000 | ORAL_CAPSULE | Freq: Two times a day (BID) | ORAL | 3 refills | Status: DC

## 2022-10-03 MED ORDER — FAMOTIDINE 20 MG PO TABS: 20.0000 mg | ORAL_TABLET | Freq: Two times a day (BID) | ORAL | 3 refills | Status: DC

## 2022-10-03 MED ORDER — ESOMEPRAZOLE MAGNESIUM 40 MG PO CPDR: DELAYED_RELEASE_CAPSULE | ORAL | 3 refills | Status: DC

## 2022-10-03 NOTE — Progress Notes (Signed)
HISTORY OF PRESENT ILLNESS:  Jamie [name] is a 77 y.o. female with a history of diverticulitis, adenomatous colon polyps, GERD, and constipation predominant irritable bowel syndrome.  She was last evaluated in the office February 04, 2002 regarding substernal burning discomfort.  See that dictation.  She subsequently underwent upper endoscopy which was normal.  She was using Pepcid on demand for her substernal burning discomfort.  She is doing better in that regard.  She presents today with new complaint of substernal burning after eating a lunch salad at [facility].  She states this has been going on for approximately 3 months.  She presents today for routine follow-up.  She continues on Nexium for her reflux disease.  She also uses Librax periodically for her IBS.  Overall doing well.  No new complaints.  She just celebrated her husband's 80th birthday with family.  Blood work from December 2023 showed normal comprehensive metabolic panel except for ALT of 40 and glucose 115.  Normal CBC with hemoglobin 13.0.  REVIEW OF SYSTEMS:  All non-GI ROS negative unless otherwise stated in the HPI except for sinus and allergy trouble, arthritis, back pain, headaches, sleeping problems, urinary frequency, urinary leakage  Past Medical History:  Diagnosis Date   Allergic rhinitis    Anxiety    Arthritis    left foot   Asthma    Back pain    Blood transfusion without reported diagnosis    Colon polyps    adenomatous   Constipation    Diverticulosis    Dysrhythmia 1999   Dr Johnsie Cancel 2003   Elevated LFTs    Fatty liver disease, nonalcoholic    GERD (gastroesophageal reflux disease)    Headache(784.0)    migraines with vision changes but no pain   Heart murmur    Hypertension    IBS (irritable bowel syndrome)    Joint pain    Migraines    Muscle cramps    Neuromuscular disorder (HCC)    RLS   Obesity    Palpitations    Pneumonia 2007   Renal cell carcinoma ([place]) 2013   minimal partial nephrectomy    Restless leg syndrome    Sleep apnea    moderate; wears CPAP, uncertain about setting   TIA (transient ischemic attack) 09/11/2014   Ulcer    Urinary incontinence    Vitamin D deficiency     Past Surgical History:  Procedure Laterality Date   BREAST REDUCTION SURGERY  Jan 1991   ELBOW SURGERY Left    KIDNEY SURGERY Right October 2013    Tumor removed    LAPAROSCOPIC APPENDECTOMY N/A 06/08/2018   Procedure: APPENDECTOMY LAPAROSCOPIC;  Surgeon: Jovita Kussmaul, MD;  Location: [facility];  Service: General;  Laterality: N/A;   TONSILLECTOMY     VAGINAL HYSTERECTOMY  1979   partial    Social History Jamie [name]  reports that she has never smoked. She has never used smokeless tobacco. She reports current alcohol use of about 2.0 - 3.0 standard drinks of alcohol per week. She reports that she does not use drugs.  family history includes Allergies in her brother and daughter; Anxiety disorder in her mother; Colon cancer in her cousin; Heart disease in her brother; Heart disease (age of onset: 65) in her father; Hypertension in her mother and another family member; Lung cancer in her father and sister; Melanoma in her daughter.  Allergies  Allergen Reactions   Buprenorphine Hcl Rash and Shortness Of Breath   Morphine  And Related Shortness Of Breath and Rash   Morphine And Related Shortness Of Breath   Aspirin Other (See Comments)    REACTION: ulcers  Can take 81 mg   Avelox [Moxifloxacin Hcl In Nacl] Hives   Diflucan [Fluconazole] Hives    Whelps and rash c skin peeling   Diflucan [Fluconazole] Hives   Hydrochlorothiazide Other (See Comments)   Latex Rash   Moxifloxacin Palpitations    Tachycardia        PHYSICAL EXAMINATION: Vital signs: BP 118/68   Pulse 75   Wt 197 lb (89.4 kg)   BMI 35.46 kg/m   Constitutional: generally well-appearing, no acute distress Psychiatric: alert and oriented x3, cooperative Eyes: extraocular movements intact, anicteric, conjunctiva  pink Mouth: oral pharynx moist, no lesions Neck: supple no lymphadenopathy Cardiovascular: heart regular rate and rhythm, no murmur Lungs: clear to auscultation bilaterally Abdomen: soft, nontender, nondistended, no obvious ascites, no peritoneal signs, normal bowel sounds, no organomegaly Rectal: Omitted Extremities: no clubbing, cyanosis, or lower extremity edema bilaterally Skin: no lesions on visible extremities Neuro: No focal deficits.  Cranial nerves intact   ASSESSMENT:  1.  GERD.  Symptoms controlled with Nexium twice daily.  As well on-demand Pepcid 2.  IBS.  Response to Librax 3.  History of diverticulitis 4.  History of adenomatous colon polyps.  Surveillance up-to-date  PLAN:  1.  Reflux precautions 2.  Refill Nexium.  Medication list reviewed 3.  Refill Pepcid.  Medication list reviewed 4.  Refill Librax.  Medication risk reviewed 5.  Surveillance colonoscopy around June 2026 6.  Routine office follow-up 1 year.  Sooner if needed.

## 2022-10-03 NOTE — Patient Instructions (Signed)
_______________________________________________________  If your blood pressure at your visit was 140/90 or greater, please contact your primary care physician to follow up on this.  _______________________________________________________  If you are age 75 or older, your body mass index should be between 23-30. Your Body mass index is 35.46 kg/m. If this is out of the aforementioned range listed, please consider follow up with your Primary Care Provider.  If you are age 6 or younger, your body mass index should be between 19-25. Your Body mass index is 35.46 kg/m. If this is out of the aformentioned range listed, please consider follow up with your Primary Care Provider.   ________________________________________________________  The [facility] providers would like to encourage you to use [facility] to communicate with providers for non-urgent requests or questions.  Due to long hold times on the telephone, sending your provider a message by [facility] may be a faster and more efficient way to get a response.  Please allow 48 business hours for a response.  Please remember that this is for non-urgent requests.  _______________________________________________________  We have sent the following medications to your pharmacy for you to pick up at your convenience:  Pepcid, Nexium, Librax, Linzess  Please follow up in one year

## 2022-10-07 MED ORDER — FAMOTIDINE 20 MG PO TABS: 20.0000 mg | ORAL_TABLET | Freq: Two times a day (BID) | ORAL | 3 refills | Status: DC

## 2022-10-07 MED ORDER — LINACLOTIDE 290 MCG PO CAPS: ORAL_CAPSULE | ORAL | 3 refills | Status: DC

## 2022-10-07 MED ORDER — ESOMEPRAZOLE MAGNESIUM 40 MG PO CPDR: DELAYED_RELEASE_CAPSULE | ORAL | 3 refills | Status: DC

## 2022-10-07 MED ORDER — CILIDINIUM-CHLORDIAZEPOXIDE 2.5-5 MG PO CAPS: 1.0000 | ORAL_CAPSULE | Freq: Two times a day (BID) | ORAL | 3 refills | Status: DC

## 2022-10-15 ENCOUNTER — Telehealth: Payer: Self-pay | Admitting: *Deleted

## 2022-10-15 NOTE — Telephone Encounter (Signed)
Called patient and asked her to bring her cpap machine along with power cord to visit on 10/16/22. Pt verbalized she understood.

## 2022-10-16 ENCOUNTER — Encounter: Payer: Self-pay | Admitting: Family Medicine

## 2022-10-16 ENCOUNTER — Ambulatory Visit (INDEPENDENT_AMBULATORY_CARE_PROVIDER_SITE_OTHER): Payer: Medicare Other | Admitting: Family Medicine

## 2022-10-16 VITALS — BP 120/75 | HR 79 | Ht 62.0 in | Wt 196.6 lb

## 2022-10-16 DIAGNOSIS — G4733 Obstructive sleep apnea (adult) (pediatric): Secondary | ICD-10-CM | POA: Diagnosis not present

## 2022-10-16 DIAGNOSIS — G2581 Restless legs syndrome: Secondary | ICD-10-CM

## 2022-10-16 DIAGNOSIS — G43709 Chronic migraine without aura, not intractable, without status migrainosus: Secondary | ICD-10-CM | POA: Diagnosis not present

## 2022-10-16 MED ORDER — CLONAZEPAM 0.5 MG PO TABS: 0.5000 mg | ORAL_TABLET | Freq: Every day | ORAL | 1 refills | Status: DC

## 2022-10-16 NOTE — Progress Notes (Signed)
PATIENT: Jamie [name] DOB: 12-15-1945  REASON FOR VISIT: follow up HISTORY FROM: patient  Chief Complaint  Patient presents with   folllow up    Pt in room 1, husband in room. Here for cpap follow up. Pt reports she is using her old cpap, new cpap broke using [facility]. Need refill on klonpin.      HISTORY OF PRESENT ILLNESS:  10/16/2022 ALL:  Jamie [name] returns for follow up for OSA on CPAP, RLS and migraines. She continues to do well with CPAP therapy. She continues to use her older machine. Newer machine obtained 03/2018 was never repaired. She will be eligible for new machine this year. She continues clonazepam 0.5mg  at bedtime. RLS symptoms are minimal. She is tolerating med with no obvious adverse effects. She is also taking Librax for IBS but denies adverse effects. PDMP shows appropriate refills. Migraines remain well managed. She rarely uses Nurtec.   04/04/2022 ALL: Jamie [name] returns for follow up for OSA on CPAP, migraines and RLS. She was last seen by me 02/2021 and doing well. She was seen for consult of RLS with Dr Vickey Huger 09/2021. PCP had previously written clonazepam and skelaxin but was no longer willing to fill meds. Dr Dohmeier agreed to continue low dose clonazepam 0.5mg  daily at bedtime for management of RLS. Since,   RLS is stable. She reports clonazepam has helped significantly. Skelaxin 400mg  was continued through PCP for multiple things including muscle spasms and shoulder pain. She denies adverse effects and feels she is tolerating medicaitons well. She feels that she is resting very well.   She continues Nurtec as needed for migraine abortion.   She reports continuing CPAP. She was set up with a new machine in 2020 but reports it starting making a very high pitched sound. When she returned machine to [facility] she was told she would have to pay 400 dollars to get it fixed. She was told there "was a dirty motor" and not covered under warranty. She is adamant that she  keeps machine and supplies cleans. She has returned to her old machine set up in 2012. It seems to be working well. Compliance report through card download shows 100% daily and 4 hour compliance but does not give [name] any information regarding residual AHI or leak.   10/02/2021 CD: This female 78 year-old  patient is now referred for a problem of RLS, was for year treated by PCP with skelaxin and klonopin. She reports her PCP refuses to reorder these medications and she has not found any other effective medication for this problem, staying awake due to RLS . She is established as an apnea patient in the [facility] sleep clinic and followed yearly with OSA on CPAP.  Patient presents today for RLS and  irresistible urge to move in both legs- rarely having bilateral leg pain. She said that she woke with cramps in her legs in December. Left leg pain comes form spinal stenosis, has had epidural injections which allowed temporary relief.  She had a difficult time getting her cramps to resolve. She said that both legs were very sore afterwards. Since the cramps, she states that her legs ache. She has a history of restless leg syndrome. No lower back or groin pain. Has been diagnosed with restless leg syndrome in the past by her primary care physician and treated on Klonopin and Skelaxin which did provide some help and allows her to go to sleep-.  She is not had any injury or trauma.  Does not really have any numbness or tingling just creepy crawly.  Has no history of diabetes.  Does not have fibromyalgia. Not on a statin drug.   Takes benadryl , which can exacerbate  RLS.   She recently has been seeing a new PCP Dr. Okey Dupre who changed medications_ Cannot tolerate trazodone and reports RLS worse on Mirapex.- both were  ordered by Dr .Okey Dupre 09-18-2021. Induced twitching.    Certainly not an easy case.  Check iron level and HBaic.   High calcium- parathyroid .   02/08/2021 ALL: Jamie [name] returns for follow up for OSA on  CPAP. She is doing very well. She uses CPAP every night for about 10 hours a night. She denies concerns with machine or supplies. She does hear some whistling from time to time. She uses a small full face mask. She recently took Pavloxid for Covid 19. She recovered well and denies lingering symptoms.      02/09/2020 ALL:  Jamie [name] is a 77 y.o. female here today for follow up for OSA on CPAP and headaches. Nurtec was started in June for abortive therapy.  She reports that headaches are well managed.  Nurtec has worked well.  She continues CPAP therapy.  She did receive her new machine last year, however, she reports that it makes an unusual whistling sound.  She has not used her new CPAP and continues to use her old machine.  Unfortunately, we do not have a compliance report to review for today's visit.  She states that she is using CPAP every night.  She denies any concerns with her old machine with the exception of having to restart CPAP from time to time.  She notes significant benefit of using CPAP therapy.  HISTORY: (copied from my note on 02/09/2019)  Jamie [name] is a 77 y.o. female here today for follow up for OSA on CPAP. She is doing well with compliance. She is concerned that her machine is not working correctly. She reports that when she tries to power up the machine, it may take 2-3 tries.  She is concerned that eventually machine will completely give out.  Last sleep study was in 2012.  She feels that her CPAP is approximately that old.  She is using CPAP nightly.  She reports that she cannot sleep without it.     HISTORY: (copied from [employer] note on 01/19/2018)   (CD)Is and is here today to be evaluated for a headache, and she reports that she first had migrainous headaches when she resumed birth control pills over 40 years ago. The headaches were very debilitating at the time but there were infrequent through the 1970s. Once she discontinued hormonal contraception her  headaches became as infrequent as once a year. In the early 1980s the couple lived in [place] and Mrs. Veneziano recalls that the bright light was a trigger for headaches -there was a strong photophobic component, and she made many trips to the local hospital for Demerol shots. She underwent a self hypnosis treatment and this worked well, controlled the migraine for years. On 9-20 02-2013 the patient woke up with a severe explosive kind of pain that seemed to arise from the right eye. The pain was centered retro-orbital and radiated backwards into the center of the head. The patient tried aspirin and Motrin with no benefit at the time as my colleague Dr. Hosie Poisson D.O. did document.  This was different from her typical migraines. She was extremely nauseated but she could  not vomit and developed dry heaves. The headache broker out of sound sleep at about about 3 AM. She had no tearing of the eye ( that she could remember),  the nose was not runny and her face was not flushe.She was asleep with her CPAP.   The patient had an eye exam in 2013 which was reportedly normal at that time and her headaches were evaluated with a brain MRI which was also reviewed and found unremarkable. Not had a number of the severe headache spell since. On 09-12-14 the patient again developed a headache this time of a new quality. She was watching TV at about 11 PM she noticed that the right vision was fluctuating she states that her vision was disturbed by changing in light and dark sensation, that she describedas if it checker board was placed in front of her eyes. By 11.45 , she tried to answer an e mail, couldn't decipher the message, non-sensical to her. She switched and tried to read aloud, she couldn't , her speech output was not correlated to the text she read. Jibberish, while her normal speech was unaffected. On the left side this time by migraines have always attack the right side of her head. This was unusual as well. The entire  episode lasted less than one hour. The emergency room obtained a CT head , non contrast, read as normal and reviewed here in imaging file. Her BP was very high.   She sleeps with a CPAP, and likes it very much, she had nocturia before being diagnosed with OSA.   Her pneumonia related hospitalization in 2007 was the cause for the test by [facility], as her RN had witnessed her sleep apnea. Dr .Vassie Loll follows her, Dr Pollyann Kennedy is her ENT.     Interval history from 03/27/2017CD. Mrs. Kuc is doing very well she has had one of the severe headaches she used to describe in the past, -onset at about 5:30 after working on the computer. She developed a severe right-sided headache. Afraid this could be another TIA I  have recommended her to go to the emergency room, she was given morphine for the headaches which then caused another reaction and side effects. She did not have any speech arrest with this one. The emergency room did not find anything abnormal, she underwent a CT scan of the brain, which was normal, MRI brain  was normal. I'm also able today to just review a download of her CPAP machine which has been followed by Dr. Renne Crigler but not in the last 3 years. She has 100% compliance for days 100% compliance for over 4 hours of daily use, average user time is 9 hours 7 minutes set pressure is 12 cm water no EPR is used residual AHI is 0.8. Excellent result. Her obesity was the same. No weight loss.  She was given a prescrition of Fioricet and ondansetron also known as Zofran. She's not supposed to take the medication before the headache component has started.    UPDATE 03/14/2018CM Ms. Magadan, 77 year old female returns for follow-up.She has not had further stroke or TIA episodes. She denies any recent falls  She had a couple episodes of blurred vision without headache. She has not had any speech arrest, weakness on one side or the other etc. She continues to use her CPAP every night, however she took that she about 2  weeks ago and we are unable to get a download. She remains on enteric-coated aspirin. She continues to get little exercise  due to her chronic vertigo. Her transcranial Dopplers showed minimal PFO not likely to be clinically significant. She has been evaluated at [facility] for her chronic vertigo. Her husband reports several instances since last seen when she fell asleep at the computer without her CPAP and stopped breathing. Once he got oxygen to her she woke up and was fine.  She returns for reevaluation.   UPDATE 06/14/2018CM Ms. Pung, 77 year old female returns for follow-up she has a history of TIA and is currently on aspirin for secondary stroke prevention. Blood pressure in the office today 105/65. She continues to take at home and generally in the 120 systolic range. Patient was seen in the emergency room on 11/07/2016 for headache in the left unilateral region the pain did not radiate no nausea and vomiting. She had some visual blurring and has a history of similar headaches She was given Reglan and Benadryl. She is having maybe 2 headaches not enough for a preventive medication. She says prescription strength Motrin takes the headache away worst part is the nausea for which she has nothing to take. She denies any speech arrest weakness on one side or the other. She continues to be compliant with her CPAP download was obtained today. Greater than 4 hours use 97% or 29 days. Average usage not hours 13 minutes at 12 cm pressure. AHI 0.8 She returns for reevaluation   04/22/17 CD I have the pleasure of seeing Mrs. Shiba today on 04/22/2017, and a routine follow up for CPAP compliance. She has done excellently with her CPAP machine which is set at 12 cm water pressure without expiratory pressure relief. Her AHI is 0.7 with an average user time of 9 hours and 2 minutes each night. She had a 97% compliance, power was lost first a night due to [employer]. [facility]- will ask for a battery back  up- Buford Dresser, RPSGT.   UPDATE 7/15/2019CM Ms. Arntz, 77 year old female returns for follow-up for CPAP compliance.  She had a motor change in her machine.  Compliance data dated 12/20/2017 01/18/2018 shows compliance greater than 4 hours at 100%.  Average usage 9 hours 22 minutes.  Set pressure 12 cm EPR level 1.  no leak or AHI data recorded on the report.  ESS 2 she returns for reevaluation   REVIEW OF SYSTEMS: Out of a complete 14 system review of symptoms, the patient complains only of the following symptoms, headaches, restless legs, muscle aches and all other reviewed systems are negative.    ALLERGIES: Allergies  Allergen Reactions   Buprenorphine Hcl Rash and Shortness Of Breath   Morphine And Related Shortness Of Breath and Rash   Morphine And Related Shortness Of Breath   Aspirin Other (See Comments)    REACTION: ulcers  Can take 81 mg   Avelox [Moxifloxacin Hcl In Nacl] Hives   Diflucan [Fluconazole] Hives    Whelps and rash c skin peeling   Diflucan [Fluconazole] Hives   Hydrochlorothiazide Other (See Comments)   Latex Rash   Moxifloxacin Palpitations    Tachycardia     HOME MEDICATIONS: Outpatient Medications Prior to Visit  Medication Sig Dispense Refill   ALPHA-LIPOIC ACID PO Take 1,200 mg by mouth daily at 2 PM.     AMBULATORY NON FORMULARY MEDICATION Medication Name: Multi Collagen Protein once daily     aspirin EC 81 MG tablet Take 1 tablet (81 mg total) by mouth daily. 90 tablet 3   BIOTIN PO Take 1 capsule by mouth daily.  clidinium-chlordiazePOXIDE (LIBRAX) 5-2.5 MG capsule Take 1 capsule by mouth 2 (two) times daily. 180 capsule 3   conjugated estrogens (PREMARIN) vaginal cream Place 1 Applicatorful vaginally every other day. 42.5 g 12   esomeprazole (NEXIUM) 40 MG capsule TAKE 1 CAPSULE TWICE A DAY BEFORE MEALS 180 capsule 3   famotidine (PEPCID) 20 MG tablet Take 1 tablet (20 mg total) by mouth 2 (two) times daily. 180 tablet 3    GLUCOSAMINE-CHONDROITIN PO Take 1 tablet by mouth daily.     linaclotide (LINZESS) 290 MCG CAPS capsule TAKE 1 CAPSULE DAILY BEFORE BREAKFAST 90 capsule 3   Magnesium 250 MG TABS Take 1 tablet by mouth daily at 2 PM.     metaxalone (SKELAXIN) 400 MG tablet Take 1 tablet (400 mg total) by mouth at bedtime. 90 tablet 3   metFORMIN (GLUCOPHAGE) 500 MG tablet TAKE 1 TABLET DAILY WITH BREAKFAST 90 tablet 3   Omega-3-6-9 CAPS Take 1 capsule by mouth every evening.     Prenatal Vit-Fe Fumarate-FA (PRENATAL MULTIVITAMIN) TABS tablet Take 1 tablet by mouth every evening.      Probiotic Product (PROBIOTIC-10 PO) Take 1 capsule by mouth 2 (two) times daily.      Rimegepant Sulfate (NURTEC) 75 MG TBDP Take 75 mg by mouth daily as needed (take for abortive therapy of migraine, no more than 1 tablet in 24 hours or 8 per month). 8 tablet 11   spironolactone (ALDACTONE) 50 MG tablet Take 1 tablet (50 mg total) by mouth daily. 90 tablet 3   TURMERIC PO Take 1 tablet by mouth daily.     vitamin C (ASCORBIC ACID) 500 MG tablet Take 500 mg by mouth daily.     Vitamin D-Vitamin K (VITAMIN K2-VITAMIN D3 PO) Take 1 tablet by mouth daily.     No facility-administered medications prior to visit.    PAST MEDICAL HISTORY: Past Medical History:  Diagnosis Date   Allergic rhinitis    Anxiety    Arthritis    left foot   Asthma    Back pain    Blood transfusion without reported diagnosis    Colon polyps    adenomatous   Constipation    Diverticulosis    Dysrhythmia 1999   Dr Eden EmmsNishan 2003   Elevated LFTs    Fatty liver disease, nonalcoholic    GERD (gastroesophageal reflux disease)    Headache(784.0)    migraines with vision changes but no pain   Heart murmur    Hypertension    IBS (irritable bowel syndrome)    Joint pain    Migraines    Muscle cramps    Neuromuscular disorder    RLS   Obesity    Palpitations    Pneumonia 2007   Renal cell carcinoma 2013   minimal partial nephrectomy   Restless leg  syndrome    Sleep apnea    moderate; wears CPAP, uncertain about setting   TIA (transient ischemic attack) 09/11/2014   Ulcer    Urinary incontinence    Vitamin D deficiency     PAST SURGICAL HISTORY: Past Surgical History:  Procedure Laterality Date   BREAST REDUCTION SURGERY  Jan 1991   ELBOW SURGERY Left    KIDNEY SURGERY Right October 2013    Tumor removed    LAPAROSCOPIC APPENDECTOMY N/A 06/08/2018   Procedure: APPENDECTOMY LAPAROSCOPIC;  Surgeon: Griselda Mineroth, Paul III, MD;  Location: [facility];  Service: General;  Laterality: N/A;   TONSILLECTOMY     VAGINAL HYSTERECTOMY  1979   partial    FAMILY HISTORY: Family History  Problem Relation Age of Onset   Hypertension Mother    Anxiety disorder Mother    Heart disease Father 45   Lung cancer Father        smoker   Lung cancer Sister    Heart disease Brother    Allergies Brother    Allergies Daughter    Melanoma Daughter    Colon cancer Cousin    Hypertension Other    Liver disease Neg Hx    Asthma Neg Hx    Esophageal cancer Neg Hx    Rectal cancer Neg Hx    Stomach cancer Neg Hx    Liver cancer Neg Hx    Prostate cancer Neg Hx    Pancreatic cancer Neg Hx     SOCIAL HISTORY: Social History   Socioeconomic History   Marital status: Married    Spouse name: Chana Bode   Number of children: 2   Years of education: 15   Highest education level: Not on file  Occupational History   Occupation: Retired    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   Retired Research officer, political party.   Education- college   Patient is right handed.   Patient drinks tea a lot and water.   Social Determinants of Health   Financial Resource Strain: Low Risk  (04/02/2022)   Overall Financial Resource Strain (CARDIA)    Difficulty of Paying Living Expenses: Not hard at all  Food Insecurity: No Food Insecurity (04/02/2022)   Hunger Vital Sign    Worried About Running Out of Food in the Last Year: Never true    Ran Out of Food in the Last Year: Never true  Transportation Needs: No Transportation Needs (04/02/2022)   PRAPARE - Administrator, Civil Service (Medical): No    Lack of Transportation (Non-Medical): No  Physical Activity: Insufficiently Active (04/02/2022)   Exercise Vital Sign    Days of Exercise per Week: 4 days    Minutes of Exercise per Session: 20 min  Stress: No Stress Concern Present (04/02/2022)   [employer] of Occupational Health - Occupational Stress Questionnaire    Feeling of Stress : Not at all  Social Connections: Socially Integrated (04/02/2022)   Social Connection and Isolation Panel [NHANES]    Frequency of Communication with Friends and Family: More than three times a week    Frequency of Social Gatherings with Friends and Family: Three times a week    Attends Religious Services: More than 4 times per year    Active Member of Clubs or Organizations: Yes    Attends Banker Meetings: More than 4 times per year    Marital Status: Married  Catering manager Violence: Not At Risk (04/02/2022)   Humiliation, Afraid, Rape, and Kick questionnaire    Fear of Current  or Ex-Partner: No    Emotionally Abused: No    Physically Abused: No    Sexually Abused: No      PHYSICAL EXAM  Vitals:   10/16/22 1438  BP: 120/75  Pulse: 79  Weight: 196 lb 9.6 oz (89.2 kg)  Height: 5\' 2"  (1.575 m)     Body mass index is 35.96 kg/m.  Generalized: Well developed, in no acute distress  Cardiology:  normal rate and rhythm, no murmur noted Respiratory: clear to auscultation bilaterally  Neurological examination  Mentation: Alert oriented to time, place, history taking. Follows all commands speech and language fluent Cranial nerve II-XII: Pupils were equal round reactive to light. Extraocular movements were full, visual field were full  Motor: The motor testing reveals 5 over 5 strength of all 4 extremities. Good symmetric motor tone is noted throughout.  Gait and station: Gait is normal.   DIAGNOSTIC DATA (LABS, IMAGING, TESTING) - I reviewed patient records, labs, notes, testing and imaging myself where available.      No data to display           Lab Results  Component Value Date   WBC 4.2 06/25/2022   HGB 13.0 06/25/2022   HCT 38.4 06/25/2022   MCV 92.1 06/25/2022   PLT 251.0 06/25/2022      Component Value Date/Time   NA 137 06/25/2022 1331   NA 142 10/18/2019 1228   K 4.3 06/25/2022 1331   CL 106 06/25/2022 1331   CO2 25 06/25/2022 1331   GLUCOSE 115 (H) 06/25/2022 1331   BUN 19 06/25/2022 1331   BUN 19 10/18/2019 1228   CREATININE 0.83 06/25/2022 1331   CALCIUM 10.3 06/25/2022 1331   PROT 7.0 06/25/2022 1331   PROT 7.5 10/18/2019 1228   ALBUMIN 4.5 06/25/2022 1331   ALBUMIN 5.0 (H) 10/18/2019 1228   AST 23 06/25/2022 1331   ALT 40 (H) 06/25/2022 1331   ALKPHOS 62 06/25/2022 1331   BILITOT 0.3 06/25/2022 1331   BILITOT 0.3 10/18/2019 1228   GFRNONAA 60 10/18/2019 1228   GFRAA 70 10/18/2019 1228   Lab Results  Component Value Date   CHOL 187 06/25/2022   HDL 70.20 06/25/2022   LDLCALC 98 06/25/2022   TRIG 95.0 06/25/2022   CHOLHDL 3 06/25/2022   Lab Results  Component Value Date   HGBA1C 5.4 06/25/2022   Lab Results  Component Value Date   VITAMINB12 647 05/03/2011   Lab Results  Component Value Date   TSH 1.900 08/25/2017       ASSESSMENT AND PLAN 77 y.o. year old female  has a past medical history of Allergic rhinitis, Anxiety,  Arthritis, Asthma, Back pain, Blood transfusion without reported diagnosis, Colon polyps, Constipation, Diverticulosis, Dysrhythmia (1999), Elevated LFTs, Fatty liver disease, nonalcoholic, GERD (gastroesophageal reflux disease), Headache(784.0), Heart murmur, Hypertension, IBS (irritable bowel syndrome), Joint pain, Migraines, Muscle cramps, Neuromuscular disorder, Obesity, Palpitations, Pneumonia (2007), Renal cell carcinoma (2013), Restless leg syndrome, Sleep apnea, TIA (transient ischemic attack) (09/11/2014), Ulcer, Urinary incontinence, and Vitamin D deficiency. here with     ICD-10-CM   1. OSA on CPAP  G47.33 For home use only DME continuous positive airway pressure (CPAP)    2. RLS (restless legs syndrome)  G25.81     3. Chronic migraine without aura without status migrainosus, not intractable  G43.709       Jamie [name] is doing very well on CPAP. Compliance report shows excellent compliance. She will continue using CPAP. She will call when ready  to start process of obtaining a new machine. She will continue Nurtec as needed for abortive therapy.  Continue clonazepam 0.5mg  QHS for RLS. PDMP shows appropriate refills. Adequate hydration and healthy lifestyle habits encouraged.  She will follow-up with me in 6 months, sooner if needed.  She verbalizes understanding and agreement with this plan.   Orders Placed This Encounter  Procedures   For home use only DME continuous positive airway pressure (CPAP)    Supplies    Order Specific Question:   Length of Need    Answer:   Lifetime    Order Specific Question:   Patient has OSA or probable OSA    Answer:   Yes    Order Specific Question:   Is the patient currently using CPAP in the home    Answer:   Yes    Order Specific Question:   Settings    Answer:   Other see comments    Order Specific Question:   CPAP supplies needed    Answer:   Mask, headgear, cushions, filters, heated tubing and water chamber      Meds ordered this encounter   Medications   clonazePAM (KLONOPIN) 0.5 MG tablet    Sig: Take 1 tablet (0.5 mg total) by mouth at bedtime.    Dispense:  90 tablet    Refill:  1    Order Specific Question:   Supervising Provider    Answer:   Bernestine Amass, FNP-C 10/16/2022, 4:04 PM [facility] [address] [place], [place] [address] [phone]

## 2022-10-16 NOTE — Progress Notes (Addendum)
Cpap supply order faxed to [facility] [phone]. Confirmation received.

## 2022-10-16 NOTE — Patient Instructions (Signed)
Below is our plan:  We will continue clonazepam 0.5mg  daily at bedtime.   Please continue using your CPAP regularly. While your insurance requires that you use CPAP at least 4 hours each night on 70% of the nights, I recommend, that you not skip any nights and use it throughout the night if you can. Getting used to CPAP and staying with the treatment long term does take time and patience and discipline. Untreated obstructive sleep apnea when it is moderate to severe can have an adverse impact on cardiovascular health and raise her risk for heart disease, arrhythmias, hypertension, congestive heart failure, stroke and diabetes. Untreated obstructive sleep apnea causes sleep disruption, nonrestorative sleep, and sleep deprivation. This can have an impact on your day to day functioning and cause daytime sleepiness and impairment of cognitive function, memory loss, mood disturbance, and problems focussing. Using CPAP regularly can improve these symptoms.  We will update supply orders, today.   Please make sure you are staying well hydrated. I recommend 50-60 ounces daily. Well balanced diet and regular exercise encouraged. Consistent sleep schedule with 6-8 hours recommended.   Please continue follow up with care team as directed.   Follow up with me in 6 months   You may receive a survey regarding today's visit. I encourage you to leave honest feed back as I do use this information to improve patient care. Thank you for seeing me today!

## 2022-10-17 MED ORDER — CLONAZEPAM 0.5 MG PO TABS: 0.5000 mg | ORAL_TABLET | Freq: Every day | ORAL | 0 refills | Status: DC

## 2022-10-21 NOTE — Telephone Encounter (Addendum)
I called [facility] they did receive generic klonpin 0.5 mg tablet # 10 it is not due to be filled until 10/31/22 (pt has not picked up yet)  Pt picked up # 30 tablet on 10/03/22 prescribed by Dr.Dohmeier. [facility] notified to cancel Rx.

## 2022-10-21 NOTE — Addendum Note (Signed)
Addended by: Aura Camps on: 10/21/2022 11:56 AM   Modules accepted: Orders

## 2022-10-24 ENCOUNTER — Telehealth: Payer: Self-pay

## 2022-10-24 ENCOUNTER — Encounter: Payer: Self-pay | Admitting: Internal Medicine

## 2022-10-24 NOTE — Telephone Encounter (Signed)
PA for Linzess

## 2022-10-24 NOTE — Telephone Encounter (Signed)
duplicate

## 2022-10-24 NOTE — Telephone Encounter (Signed)
PA needs to be initiated for Linzess

## 2022-10-29 ENCOUNTER — Telehealth: Payer: Self-pay

## 2022-10-29 ENCOUNTER — Other Ambulatory Visit (HOSPITAL_COMMUNITY): Payer: Self-pay

## 2022-10-29 NOTE — Telephone Encounter (Signed)
PA for Linzess initiated.  Awaiting updates

## 2022-10-29 NOTE — Telephone Encounter (Signed)
PA renewal request received via provider for Linzess capsules  PA has been submitted to [employer] via CMM and is pending determination  Key: [id number]

## 2022-10-29 NOTE — Telephone Encounter (Signed)
PA has been APPROVED until further notice through [employer].

## 2022-10-29 NOTE — Telephone Encounter (Signed)
PA request submitted, will update in additional telephone encounter created

## 2022-11-04 DIAGNOSIS — Z1231 Encounter for screening mammogram for malignant neoplasm of breast: Secondary | ICD-10-CM | POA: Diagnosis not present

## 2022-11-05 ENCOUNTER — Other Ambulatory Visit (HOSPITAL_COMMUNITY): Payer: Self-pay

## 2022-11-05 NOTE — Telephone Encounter (Signed)
Per [place] - "PA was approved and encounter updated on 4.23.24. Next earliest fill is on 6.7.24"

## 2022-11-05 NOTE — Telephone Encounter (Signed)
Update of PA requested from [facility] team

## 2022-11-07 DIAGNOSIS — R922 Inconclusive mammogram: Secondary | ICD-10-CM | POA: Diagnosis not present

## 2022-11-07 LAB — HM MAMMOGRAPHY

## 2022-11-08 ENCOUNTER — Encounter: Payer: Self-pay | Admitting: Internal Medicine

## 2022-11-18 ENCOUNTER — Encounter: Payer: Self-pay | Admitting: Internal Medicine

## 2022-11-27 ENCOUNTER — Other Ambulatory Visit: Payer: Self-pay | Admitting: Internal Medicine

## 2023-01-07 DIAGNOSIS — R0981 Nasal congestion: Secondary | ICD-10-CM | POA: Diagnosis not present

## 2023-01-07 DIAGNOSIS — Z20822 Contact with and (suspected) exposure to covid-19: Secondary | ICD-10-CM | POA: Diagnosis not present

## 2023-01-07 DIAGNOSIS — R070 Pain in throat: Secondary | ICD-10-CM | POA: Diagnosis not present

## 2023-01-07 DIAGNOSIS — R067 Sneezing: Secondary | ICD-10-CM | POA: Diagnosis not present

## 2023-01-20 DIAGNOSIS — R0981 Nasal congestion: Secondary | ICD-10-CM | POA: Diagnosis not present

## 2023-01-20 DIAGNOSIS — R519 Headache, unspecified: Secondary | ICD-10-CM | POA: Diagnosis not present

## 2023-01-20 DIAGNOSIS — R051 Acute cough: Secondary | ICD-10-CM | POA: Diagnosis not present

## 2023-01-20 DIAGNOSIS — U071 COVID-19: Secondary | ICD-10-CM | POA: Diagnosis not present

## 2023-01-20 DIAGNOSIS — R070 Pain in throat: Secondary | ICD-10-CM | POA: Diagnosis not present

## 2023-04-02 DIAGNOSIS — Z23 Encounter for immunization: Secondary | ICD-10-CM | POA: Diagnosis not present

## 2023-04-15 ENCOUNTER — Other Ambulatory Visit: Payer: Self-pay | Admitting: *Deleted

## 2023-04-15 ENCOUNTER — Encounter: Payer: Self-pay | Admitting: Family Medicine

## 2023-04-15 MED ORDER — CLONAZEPAM 0.5 MG PO TABS: 0.5000 mg | ORAL_TABLET | Freq: Every day | ORAL | 0 refills | Status: DC

## 2023-04-15 NOTE — Telephone Encounter (Signed)
Pt sent mychart message: "Jamie [name], I have eight Clonazepam .5 mg left and need this refilled through [facility] as soon as possible. Please, take care of this for me. See you later this month. Thank you, Teresa Kehaulani Fruin"   Pt last seen 10/16/22 and next f/u 04/30/23. Last refilled 01/11/23 #90.

## 2023-04-24 ENCOUNTER — Ambulatory Visit: Payer: Medicare Other

## 2023-04-24 VITALS — Ht 62.0 in | Wt 180.0 lb

## 2023-04-24 DIAGNOSIS — Z0000 Encounter for general adult medical examination without abnormal findings: Secondary | ICD-10-CM | POA: Diagnosis not present

## 2023-04-24 NOTE — Patient Instructions (Addendum)
Jamie [name] , Thank you for taking time to come for your Medicare Wellness Visit. I appreciate your ongoing commitment to your health goals. Please review the following plan we discussed and let me know if I can assist you in the future.   Referrals/Orders/Follow-Ups/Clinician Recommendations: NO  This is a list of the screening recommended for you and due dates:  Health Maintenance  Topic Date Due   Hepatitis C Screening  Never done   Zoster (Shingles) Vaccine (2 of 2) 04/05/2021   Flu Shot  02/06/2023   COVID-19 Vaccine (4 - 2023-24 season) 03/09/2023   DTaP/Tdap/Td vaccine (4 - Td or Tdap) 05/20/2023   Medicare Annual Wellness Visit  04/23/2024   Colon Cancer Screening  12/26/2026   Pneumonia Vaccine  Completed   DEXA scan (bone density measurement)  Completed   HPV Vaccine  Aged Out   Conditions/risks identified: Yes; Reviewed health maintenance screenings with patient today and relevant education, vaccines, and/or referrals were provided. Please continue to do your personal lifestyle choices by: daily care of teeth and gums, regular physical activity (goal should be 5 days a week for 30 minutes), eat a healthy diet, avoid tobacco and drug use, limiting any alcohol intake, taking a low-dose aspirin (if not allergic or have been advised by your provider otherwise) and taking vitamins and minerals as recommended by your provider. Continue doing brain stimulating activities (puzzles, reading, adult coloring books, staying active) to keep memory sharp. Continue to eat heart healthy diet (full of fruits, vegetables, whole grains, lean protein, water--limit salt, fat, and sugar intake) and increase physical activity as tolerated.  Advanced directives: (Copy Requested) Please bring a copy of your health care power of attorney and living will to the office to be added to your chart at your convenience.  Next Medicare Annual Wellness Visit scheduled for next year: Yes; please check MyChart for  date and time.

## 2023-04-24 NOTE — Progress Notes (Signed)
Subjective:   Jamie [name] is a 77 y.o. female who presents for Medicare Annual (Subsequent) preventive examination.  Visit Complete: Virtual I connected with  Jamie [name] on 04/24/23 by a audio enabled telemedicine application and verified that I am speaking with the correct person using two identifiers.  Patient Location: Home  Provider Location: Office/Clinic  I discussed the limitations of evaluation and management by telemedicine. The patient expressed understanding and agreed to proceed.  Vital Signs: Because this visit was a virtual/telehealth visit, some criteria may be missing or patient reported. Any vitals not documented were not able to be obtained and vitals that have been documented are patient reported.  Cardiac Risk Factors include: advanced age (>3men, >59 women);dyslipidemia;family history of premature cardiovascular disease;hypertension;obesity (BMI >30kg/m2)     Objective:    Today's Vitals   04/24/23 1404  Weight: 180 lb (81.6 kg)  Height: 5\' 2"  (1.575 m)  PainSc: 0-No pain   Body mass index is 32.92 kg/m.     04/24/2023    2:06 PM 04/02/2022    3:42 PM 06/09/2018    7:36 AM 06/09/2018   12:49 AM 06/08/2018   11:34 AM 11/06/2017    7:29 PM 01/13/2017   10:43 AM  Advanced Directives  Does Patient Have a Medical Advance Directive? Yes Yes  Yes No    Type of Estate agent of [place];Living will Healthcare Power of [place];Living will Healthcare Power of [employer] of [place];Living will     Does patient want to make changes to medical advance directive?  No - Patient declined       Copy of Healthcare Power of Attorney in Chart? No - copy requested No - copy requested No - copy requested No - copy requested        Information is confidential and restricted. Go to Review Flowsheets to unlock data.    Current Medications (verified) Outpatient Encounter Medications as of 04/24/2023  Medication Sig    ALPHA-LIPOIC ACID PO Take 1,200 mg by mouth daily at 2 PM.   AMBULATORY NON FORMULARY MEDICATION Medication Name: Multi Collagen Protein once daily   aspirin EC 81 MG tablet Take 1 tablet (81 mg total) by mouth daily.   BIOTIN PO Take 1 capsule by mouth daily.    clidinium-chlordiazePOXIDE (LIBRAX) 5-2.5 MG capsule Take 1 capsule by mouth 2 (two) times daily.   clonazePAM (KLONOPIN) 0.5 MG tablet Take 1 tablet (0.5 mg total) by mouth at bedtime.   conjugated estrogens (PREMARIN) vaginal cream Place 1 Applicatorful vaginally every other day.   esomeprazole (NEXIUM) 40 MG capsule TAKE 1 CAPSULE TWICE A DAY BEFORE MEALS   famotidine (PEPCID) 20 MG tablet Take 1 tablet (20 mg total) by mouth 2 (two) times daily.   GLUCOSAMINE-CHONDROITIN PO Take 1 tablet by mouth daily.   linaclotide (LINZESS) 290 MCG CAPS capsule TAKE 1 CAPSULE DAILY BEFORE BREAKFAST   Magnesium 250 MG TABS Take 1 tablet by mouth daily at 2 PM.   metaxalone (SKELAXIN) 400 MG tablet TAKE 1 TABLET AT BEDTIME   metFORMIN (GLUCOPHAGE) 500 MG tablet TAKE 1 TABLET DAILY WITH BREAKFAST   Omega-3-6-9 CAPS Take 1 capsule by mouth every evening.   Prenatal Vit-Fe Fumarate-FA (PRENATAL MULTIVITAMIN) TABS tablet Take 1 tablet by mouth every evening.    Probiotic Product (PROBIOTIC-10 PO) Take 1 capsule by mouth 2 (two) times daily.    Rimegepant Sulfate (NURTEC) 75 MG TBDP Take 75 mg by mouth daily as needed (take  for abortive therapy of migraine, no more than 1 tablet in 24 hours or 8 per month).   spironolactone (ALDACTONE) 50 MG tablet TAKE 1 TABLET DAILY   TURMERIC PO Take 1 tablet by mouth daily.   vitamin C (ASCORBIC ACID) 500 MG tablet Take 500 mg by mouth daily.   Vitamin D-Vitamin K (VITAMIN K2-VITAMIN D3 PO) Take 1 tablet by mouth daily.   No facility-administered encounter medications on file as of 04/24/2023.    Allergies (verified) Buprenorphine hcl, Morphine and codeine, Morphine and codeine, Aspirin, Avelox [moxifloxacin  hcl in nacl], Diflucan [fluconazole], Diflucan [fluconazole], Hydrochlorothiazide, Latex, and Moxifloxacin   History: Past Medical History:  Diagnosis Date   Allergic rhinitis    Anxiety    Arthritis    left foot   Asthma    Back pain    Blood transfusion without reported diagnosis    Colon polyps    adenomatous   Constipation    Diverticulosis    Dysrhythmia 1999   Dr Eden Emms 2003   Elevated LFTs    Fatty liver disease, nonalcoholic    GERD (gastroesophageal reflux disease)    Headache(784.0)    migraines with vision changes but no pain   Heart murmur    Hypertension    IBS (irritable bowel syndrome)    Joint pain    Migraines    Muscle cramps    Neuromuscular disorder (HCC)    RLS   Obesity    Palpitations    Pneumonia 2007   Renal cell carcinoma (HCC) 2013   minimal partial nephrectomy   Restless leg syndrome    Sleep apnea    moderate; wears CPAP, uncertain about setting   TIA (transient ischemic attack) 09/11/2014   Ulcer    Urinary incontinence    Vitamin D deficiency    Past Surgical History:  Procedure Laterality Date   BREAST REDUCTION SURGERY  Jan 1991   ELBOW SURGERY Left    KIDNEY SURGERY Right October 2013    Tumor removed    LAPAROSCOPIC APPENDECTOMY N/A 06/08/2018   Procedure: APPENDECTOMY LAPAROSCOPIC;  Surgeon: Griselda Miner, MD;  Location: [facility];  Service: General;  Laterality: N/A;   TONSILLECTOMY     VAGINAL HYSTERECTOMY  1979   partial   Family History  Problem Relation Age of Onset   Hypertension Mother    Anxiety disorder Mother    Heart disease Father 29   Lung cancer Father        smoker   Lung cancer Sister    Heart disease Brother    Allergies Brother    Allergies Daughter    Melanoma Daughter    Colon cancer Cousin    Hypertension Other    Liver disease Neg Hx    Asthma Neg Hx    Esophageal cancer Neg Hx    Rectal cancer Neg Hx    Stomach cancer Neg Hx    Liver cancer Neg Hx    Prostate cancer Neg Hx    Pancreatic  cancer Neg Hx    Social History   Socioeconomic History   Marital status: Married    Spouse name: Jamie [name]   Number of children: 2   Years of education: 15   Highest education level: Not on file  Occupational History   Occupation: Retired    Associate Professor: OTHER    Comment: real estate agent  Tobacco Use   Smoking status: Never   Smokeless tobacco: Never  Vaping Use   Vaping  status: Never Used  Substance and Sexual Activity   Alcohol use: Yes    Alcohol/week: 2.0 - 3.0 standard drinks of alcohol    Types: 1 - 2 Glasses of wine, 1 Shots of liquor per week    Comment: occassionally   Drug use: No   Sexual activity: Yes    Birth control/protection: Surgical  Other Topics Concern   Not on file  Social History Narrative   Married Jamie [name]) and lives with her spouse.   Retired Research officer, political party.   Education- college   Patient is right handed.   Patient drinks tea a lot and water.   Social Determinants of Health   Financial Resource Strain: Low Risk  (04/24/2023)   Overall Financial Resource Strain (CARDIA)    Difficulty of Paying Living Expenses: Not hard at all  Food Insecurity: No Food Insecurity (04/24/2023)   Hunger Vital Sign    Worried About Running Out of Food in the Last Year: Never true    Ran Out of Food in the Last Year: Never true  Transportation Needs: No Transportation Needs (04/24/2023)   PRAPARE - Administrator, Civil Service (Medical): No    Lack of Transportation (Non-Medical): No  Physical Activity: Insufficiently Active (04/24/2023)   Exercise Vital Sign    Days of Exercise per Week: 4 days    Minutes of Exercise per Session: 20 min  Stress: No Stress Concern Present (04/24/2023)   [employer] of Occupational Health - Occupational Stress Questionnaire    Feeling of Stress : Not at all  Social Connections: Socially Integrated (04/24/2023)   Social Connection and Isolation Panel [NHANES]    Frequency of Communication with Friends and  Family: More than three times a week    Frequency of Social Gatherings with Friends and Family: Three times a week    Attends Religious Services: More than 4 times per year    Active Member of Clubs or Organizations: Yes    Attends Engineer, structural: More than 4 times per year    Marital Status: Married    Tobacco Counseling Counseling given: Not Answered   Clinical Intake:  Pre-visit preparation completed: Yes  Pain : No/denies pain Pain Score: 0-No pain     BMI - recorded: 32.92 Nutritional Status: BMI > 30  Obese Nutritional Risks: None Diabetes: No  How often do you need to have someone help you when you read instructions, pamphlets, or other written materials from your doctor or pharmacy?: 1 - Never What is the last grade level you completed in school?: 2 YEARS OF COLLEGE  Interpreter Needed?: No  Information entered by :: Carlicia Leavens N. Dakari Stabler, LPN.   Activities of Daily Living    04/24/2023    2:09 PM  In your present state of health, do you have any difficulty performing the following activities:  Hearing? 0  Vision? 0  Difficulty concentrating or making decisions? 0  Walking or climbing stairs? 0  Dressing or bathing? 0  Doing errands, shopping? 0  Preparing Food and eating ? N  Using the Toilet? N  In the past six months, have you accidently leaked urine? Y  Do you have problems with loss of bowel control? N  Managing your Medications? N  Managing your Finances? N  Housekeeping or managing your Housekeeping? N    Patient Care Team: Myrlene Broker, MD as PCP - General (Internal Medicine)  Indicate any recent Medical Services you may have received from other than  [facility] providers in the past year (date may be approximate).     Assessment:   This is a routine wellness examination for Evalee.  Hearing/Vision screen Hearing Screening - Comments:: Patient denied any hearing difficulty.   No hearing aids.  Vision Screening -  Comments:: Patient does wear corrective lenses/contacts.  Annual eye exam done by: Mannie Stabile, OD.    Goals Addressed             This Visit's Progress    Client understands the importance of follow-up with providers by attending scheduled visits        Depression Screen    04/24/2023    2:08 PM 06/25/2022    1:01 PM 04/02/2022    3:39 PM 12/24/2021    1:45 PM 09/18/2021    2:13 PM 08/25/2017    9:52 AM  PHQ 2/9 Scores  PHQ - 2 Score 0 0 0 0 0   PHQ- 9 Score 0 0         Information is confidential and restricted. Go to Review Flowsheets to unlock data.    Fall Risk    04/24/2023    2:07 PM 06/25/2022    1:01 PM 04/02/2022    3:58 PM 12/24/2021    1:45 PM 09/18/2021    2:13 PM  Fall Risk   Falls in the past year? 0 0 1 0 0  Number falls in past yr: 0 0 0  0  Injury with Fall? 0 0 0  0  Risk for fall due to : No Fall Risks  History of fall(s)    Follow up Falls prevention discussed Falls evaluation completed Falls evaluation completed      MEDICARE RISK AT HOME: Medicare Risk at Home Any stairs in or around the home?: Yes If so, are there any without handrails?: No Home free of loose throw rugs in walkways, pet beds, electrical cords, etc?: Yes Adequate lighting in your home to reduce risk of falls?: Yes Life alert?: No Use of a cane, walker or w/c?: No Grab bars in the bathroom?: No Shower chair or bench in shower?: No Elevated toilet seat or a handicapped toilet?: Yes  TIMED UP AND GO:  Was the test performed?  No    Cognitive Function: Normal cognitive status assessed by direct observation via telephone conversation by this Nurse Health Advisor. No abnormalities found.     04/24/2023    2:08 PM  MMSE - Mini Mental State Exam  Not completed: Unable to complete        04/24/2023    2:08 PM 04/02/2022    3:44 PM  6CIT Screen  What Year? 0 points 0 points  What month? 0 points 0 points  What time? 0 points 0 points  Count back from 20 0 points 0  points  Months in reverse 0 points 0 points  Repeat phrase 0 points 0 points  Total Score 0 points 0 points    Immunizations Immunization History  Administered Date(s) Administered   Influenza Split 04/07/2012   Influenza Whole 04/01/2007, 04/22/2008, 05/04/2010, 03/14/2011   Influenza, High Dose Seasonal PF 04/27/2014, 04/21/2015   Influenza, Quadrivalent, Recombinant, Inj, Pf 03/20/2018, 03/26/2019   Influenza-Unspecified 03/31/2012, 04/23/2013, 04/14/2021   PFIZER(Purple Top)SARS-COV-2 Vaccination 08/16/2019, 09/08/2019, 04/03/2020   Pneumococcal Conjugate-13 11/16/2013   Pneumococcal Polysaccharide-23 01/01/2006, 04/01/2007, 10/25/2021   Td 09/25/1998, 02/21/2010   Tdap 05/19/2013   Zoster Recombinant(Shingrix) 02/08/2021   Zoster, Live 05/07/2011    TDAP status: Up to date  Flu Vaccine status: Up to date  Pneumococcal vaccine status: Up to date  Covid-19 vaccine status: Completed vaccines  Qualifies for Shingles Vaccine? Yes   Zostavax completed Yes   Shingrix Completed?: No.    Education has been provided regarding the importance of this vaccine. Patient has been advised to call insurance company to determine out of pocket expense if they have not yet received this vaccine. Advised may also receive vaccine at local pharmacy or Health Dept. Verbalized acceptance and understanding.  Screening Tests Health Maintenance  Topic Date Due   Hepatitis C Screening  Never done   Zoster Vaccines- Shingrix (2 of 2) 04/05/2021   INFLUENZA VACCINE  02/06/2023   COVID-19 Vaccine (4 - 2023-24 season) 03/09/2023   DTaP/Tdap/Td (4 - Td or Tdap) 05/20/2023   Medicare Annual Wellness (AWV)  04/23/2024   Colonoscopy  12/26/2026   Pneumonia Vaccine 43+ Years old  Completed   DEXA SCAN  Completed   HPV VACCINES  Aged Out    Health Maintenance  Health Maintenance Due  Topic Date Due   Hepatitis C Screening  Never done   Zoster Vaccines- Shingrix (2 of 2) 04/05/2021   INFLUENZA  VACCINE  02/06/2023   COVID-19 Vaccine (4 - 2023-24 season) 03/09/2023    Colorectal cancer screening: Type of screening: Colonoscopy. Completed 12/25/2021. Repeat every 5 years  Mammogram status: Completed 11/07/2022. Repeat every year  Bone Density status: Completed 12/25/2021. Results reflect: Bone density results: OSTEOPENIA. Repeat every 2-3 years.  Lung Cancer Screening: (Low Dose CT Chest recommended if Age 61-80 years, 20 pack-year currently smoking OR have quit w/in 15years.) does not qualify.   Lung Cancer Screening Referral: NO  Additional Screening:  Hepatitis C Screening: does qualify; Completed: NO  Vision Screening: Recommended annual ophthalmology exams for early detection of glaucoma and other disorders of the eye. Is the patient up to date with their annual eye exam?  Yes  Who is the provider or what is the name of the office in which the patient attends annual eye exams? STACEY HUTTO, OD. If pt is not established with a provider, would they like to be referred to a provider to establish care? No .   Dental Screening: Recommended annual dental exams for proper oral hygiene  Diabetic Foot Exam: N/A  Community Resource Referral / Chronic Care Management: CRR required this visit?  No   CCM required this visit?  No     Plan:     I have personally reviewed and noted the following in the patient's chart:   Medical and social history Use of alcohol, tobacco or illicit drugs  Current medications and supplements including opioid prescriptions. Patient is not currently taking opioid prescriptions. Functional ability and status Nutritional status Physical activity Advanced directives List of other physicians Hospitalizations, surgeries, and ER visits in previous 12 months Vitals Screenings to include cognitive, depression, and falls Referrals and appointments  In addition, I have reviewed and discussed with patient certain preventive protocols, quality metrics,  and best practice recommendations. A written personalized care plan for preventive services as well as general preventive health recommendations were provided to patient.     Mickeal Needy, LPN   [id number]   After Visit Summary: (MyChart) Due to this being a telephonic visit, the after visit summary with patients personalized plan was offered to patient via MyChart   Nurse Notes: N/A

## 2023-04-29 ENCOUNTER — Telehealth: Payer: Self-pay | Admitting: *Deleted

## 2023-04-29 NOTE — Telephone Encounter (Signed)
Called patient and asked her to bring her cpap machine along with power cord to visit on 04/30/23. Pt verbalized she understood.

## 2023-04-30 ENCOUNTER — Ambulatory Visit (INDEPENDENT_AMBULATORY_CARE_PROVIDER_SITE_OTHER): Payer: Medicare Other | Admitting: Family Medicine

## 2023-04-30 ENCOUNTER — Encounter: Payer: Self-pay | Admitting: Family Medicine

## 2023-04-30 VITALS — BP 126/75 | HR 72 | Ht 62.0 in | Wt 198.0 lb

## 2023-04-30 DIAGNOSIS — G2581 Restless legs syndrome: Secondary | ICD-10-CM | POA: Diagnosis not present

## 2023-04-30 DIAGNOSIS — G4733 Obstructive sleep apnea (adult) (pediatric): Secondary | ICD-10-CM

## 2023-04-30 DIAGNOSIS — G43709 Chronic migraine without aura, not intractable, without status migrainosus: Secondary | ICD-10-CM

## 2023-04-30 NOTE — Patient Instructions (Addendum)
Below is our plan:  We will continue clonazepam 0.5mg  daily at bedtime. We will check some labs, today to make sure there is no reason for the worsening.   Please continue using your CPAP regularly. While your insurance requires that you use CPAP at least 4 hours each night on 70% of the nights, I recommend, that you not skip any nights and use it throughout the night if you can. Getting used to CPAP and staying with the treatment long term does take time and patience and discipline. Untreated obstructive sleep apnea when it is moderate to severe can have an adverse impact on cardiovascular health and raise her risk for heart disease, arrhythmias, hypertension, congestive heart failure, stroke and diabetes. Untreated obstructive sleep apnea causes sleep disruption, nonrestorative sleep, and sleep deprivation. This can have an impact on your day to day functioning and cause daytime sleepiness and impairment of cognitive function, memory loss, mood disturbance, and problems focussing. Using CPAP regularly can improve these symptoms. Please make sure you are staying well hydrated. I recommend 50-60 ounces daily. Well balanced diet and regular exercise encouraged. Consistent sleep schedule with 6-8 hours recommended.   Please continue follow up with care team as directed.   Follow up with Dr Vickey Huger in 6 months   You may receive a survey regarding today's visit. I encourage you to leave honest feed back as I do use this information to improve patient care. Thank you for seeing me today!

## 2023-04-30 NOTE — Progress Notes (Signed)
PATIENT: Jamie [name] DOB: July 30, 1945  REASON FOR VISIT: follow up HISTORY FROM: patient  Chief Complaint  Patient presents with   Room 1    Pt is here Alone. Pt states that things are going good with her CPAP Machine. Pt states that her RLS has gotten worse at times.      HISTORY OF PRESENT ILLNESS:  04/30/2023 ALL:  Jamie [name] returns for follow up for OSA on CPAP, RLS and migraines. She was last seen 10/2022 and doing well. She continues clonazepam 0.5mg  at bedtime. Last refilled 90 tablets 04/16/2023. RLS symptoms wax and wane. She reports worsening when really active. She usually gets to sleep fairly easily but wakes with more leg pain. Usually 1-2 times a month. She also continues Skelaxin. CPE 06/2022. CBC unremarkable. Last A1C 5.4.   She continues daily use of CPAP. CPAP set up 02/2019. Data does not provide AHI. She has tried to replace machine in the past but insurance would not cover. She feels she is doing well. Compliance is excellent. She denies headaches. Can't remember the last time she had a migraine.occasional dry mouth. Not overly bothersome. She usually wakes feeling refreshed but does report sleeping longer as she age.      10/16/2022 ALL:  Jamie [name] returns for follow up for OSA on CPAP, RLS and migraines. She continues to do well with CPAP therapy. She continues to use her older machine. Newer machine obtained 03/2018 was never repaired. She will be eligible for new machine this year. She continues clonazepam 0.5mg  at bedtime. RLS symptoms are minimal. She is tolerating med with no obvious adverse effects. She is also taking Librax for IBS but denies adverse effects. PDMP shows appropriate refills. Migraines remain well managed. She rarely uses Nurtec.   04/04/2022 ALL: Jamie [name] returns for follow up for OSA on CPAP, migraines and RLS. She was last seen by me 02/2021 and doing well. She was seen for consult of RLS with Dr Vickey Huger 09/2021. PCP had previously written  clonazepam and skelaxin but was no longer willing to fill meds. Dr Dohmeier agreed to continue low dose clonazepam 0.5mg  daily at bedtime for management of RLS. Since,   RLS is stable. She reports clonazepam has helped significantly. Skelaxin 400mg  was continued through PCP for multiple things including muscle spasms and shoulder pain. She denies adverse effects and feels she is tolerating medicaitons well. She feels that she is resting very well.   She continues Nurtec as needed for migraine abortion.   She reports continuing CPAP. She was set up with a new machine in 2020 but reports it starting making a very high pitched sound. When she returned machine to [facility] she was told she would have to pay 400 dollars to get it fixed. She was told there "was a dirty motor" and not covered under warranty. She is adamant that she keeps machine and supplies cleans. She has returned to her old machine set up in 2012. It seems to be working well. Compliance report through card download shows 100% daily and 4 hour compliance but does not give [name] any information regarding residual AHI or leak.   10/02/2021 CD: This female 77 year-old  patient is now referred for a problem of RLS, was for year treated by PCP with skelaxin and klonopin. She reports her PCP refuses to reorder these medications and she has not found any other effective medication for this problem, staying awake due to RLS . She is established as an apnea patient  in the [facility] sleep clinic and followed yearly with OSA on CPAP.  Patient presents today for RLS and  irresistible urge to move in both legs- rarely having bilateral leg pain. She said that she woke with cramps in her legs in December. Left leg pain comes form spinal stenosis, has had epidural injections which allowed temporary relief.  She had a difficult time getting her cramps to resolve. She said that both legs were very sore afterwards. Since the cramps, she states that her legs ache. She has a  history of restless leg syndrome. No lower back or groin pain. Has been diagnosed with restless leg syndrome in the past by her primary care physician and treated on Klonopin and Skelaxin which did provide some help and allows her to go to sleep-.  She is not had any injury or trauma.  Does not really have any numbness or tingling just creepy crawly.  Has no history of diabetes.  Does not have fibromyalgia. Not on a statin drug.   Takes benadryl , which can exacerbate  RLS.   She recently has been seeing a new PCP Dr. Okey Dupre who changed medications_ Cannot tolerate trazodone and reports RLS worse on Mirapex.- both were  ordered by Dr .Okey Dupre 09-18-2021. Induced twitching.    Certainly not an easy case.  Check iron level and HBaic.   High calcium- parathyroid .   02/08/2021 ALL: Jamie [name] returns for follow up for OSA on CPAP. She is doing very well. She uses CPAP every night for about 10 hours a night. She denies concerns with machine or supplies. She does hear some whistling from time to time. She uses a small full face mask. She recently took Pavloxid for Covid 19. She recovered well and denies lingering symptoms.      02/09/2020 ALL:  Jamie [name] is a 77 y.o. female here today for follow up for OSA on CPAP and headaches. Nurtec was started in June for abortive therapy.  She reports that headaches are well managed.  Nurtec has worked well.  She continues CPAP therapy.  She did receive her new machine last year, however, she reports that it makes an unusual whistling sound.  She has not used her new CPAP and continues to use her old machine.  Unfortunately, we do not have a compliance report to review for today's visit.  She states that she is using CPAP every night.  She denies any concerns with her old machine with the exception of having to restart CPAP from time to time.  She notes significant benefit of using CPAP therapy.  HISTORY: (copied from my note on 02/09/2019)  Jamie [name] is a  77 y.o. female here today for follow up for OSA on CPAP. She is doing well with compliance. She is concerned that her machine is not working correctly. She reports that when she tries to power up the machine, it may take 2-3 tries.  She is concerned that eventually machine will completely give out.  Last sleep study was in 2012.  She feels that her CPAP is approximately that old.  She is using CPAP nightly.  She reports that she cannot sleep without it.     HISTORY: (copied from [employer] note on 01/19/2018)   (CD)Is and is here today to be evaluated for a headache, and she reports that she first had migrainous headaches when she resumed birth control pills over 40 years ago. The headaches were very debilitating at the time but there  were infrequent through the 1970s. Once she discontinued hormonal contraception her headaches became as infrequent as once a year. In the early 1980s the couple lived in [place] and Jamie [name] recalls that the bright light was a trigger for headaches -there was a strong photophobic component, and she made many trips to the local hospital for Demerol shots. She underwent a self hypnosis treatment and this worked well, controlled the migraine for years. On 9-20 02-2013 the patient woke up with a severe explosive kind of pain that seemed to arise from the right eye. The pain was centered retro-orbital and radiated backwards into the center of the head. The patient tried aspirin and Motrin with no benefit at the time as my colleague Dr. Hosie Poisson D.O. did document.  This was different from her typical migraines. She was extremely nauseated but she could not vomit and developed dry heaves. The headache broker out of sound sleep at about about 3 AM. She had no tearing of the eye ( that she could remember),  the nose was not runny and her face was not flushe.She was asleep with her CPAP.   The patient had an eye exam in 2013 which was reportedly normal at that time and her headaches  were evaluated with a brain MRI which was also reviewed and found unremarkable. Not had a number of the severe headache spell since. On 09-12-14 the patient again developed a headache this time of a new quality. She was watching TV at about 11 PM she noticed that the right vision was fluctuating she states that her vision was disturbed by changing in light and dark sensation, that she describedas if it checker board was placed in front of her eyes. By 11.45 , she tried to answer an e mail, couldn't decipher the message, non-sensical to her. She switched and tried to read aloud, she couldn't , her speech output was not correlated to the text she read. Jibberish, while her normal speech was unaffected. On the left side this time by migraines have always attack the right side of her head. This was unusual as well. The entire episode lasted less than one hour. The emergency room obtained a CT head , non contrast, read as normal and reviewed here in imaging file. Her BP was very high.   She sleeps with a CPAP, and likes it very much, she had nocturia before being diagnosed with OSA.   Her pneumonia related hospitalization in 2007 was the cause for the test by [facility], as her RN had witnessed her sleep apnea. Dr .Vassie Loll follows her, Dr Pollyann Kennedy is her ENT.     Interval history from 03/27/2017CD. Jamie [name] is doing very well she has had one of the severe headaches she used to describe in the past, -onset at about 5:30 after working on the computer. She developed a severe right-sided headache. Afraid this could be another TIA I  have recommended her to go to the emergency room, she was given morphine for the headaches which then caused another reaction and side effects. She did not have any speech arrest with this one. The emergency room did not find anything abnormal, she underwent a CT scan of the brain, which was normal, MRI brain  was normal. I'm also able today to just review a download of her CPAP machine which has  been followed by Dr. Renne Crigler but not in the last 3 years. She has 100% compliance for days 100% compliance for over 4 hours of daily use, average  user time is 9 hours 7 minutes set pressure is 12 cm water no EPR is used residual AHI is 0.8. Excellent result. Her obesity was the same. No weight loss.  She was given a prescrition of Fioricet and ondansetron also known as Zofran. She's not supposed to take the medication before the headache component has started.    UPDATE 03/14/2018CM Jamie [name], 77 year old female returns for follow-up.She has not had further stroke or TIA episodes. She denies any recent falls  She had a couple episodes of blurred vision without headache. She has not had any speech arrest, weakness on one side or the other etc. She continues to use her CPAP every night, however she took that she about 2 weeks ago and we are unable to get a download. She remains on enteric-coated aspirin. She continues to get little exercise due to her chronic vertigo. Her transcranial Dopplers showed minimal PFO not likely to be clinically significant. She has been evaluated at [facility] for her chronic vertigo. Her husband reports several instances since last seen when she fell asleep at the computer without her CPAP and stopped breathing. Once he got oxygen to her she woke up and was fine.  She returns for reevaluation.   UPDATE 06/14/2018CM Jamie [name], 77 year old female returns for follow-up she has a history of TIA and is currently on aspirin for secondary stroke prevention. Blood pressure in the office today 105/65. She continues to take at home and generally in the 120 systolic range. Patient was seen in the emergency room on 11/07/2016 for headache in the left unilateral region the pain did not radiate no nausea and vomiting. She had some visual blurring and has a history of similar headaches She was given Reglan and Benadryl. She is having maybe 2 headaches not enough for a preventive medication.  She says prescription strength Motrin takes the headache away worst part is the nausea for which she has nothing to take. She denies any speech arrest weakness on one side or the other. She continues to be compliant with her CPAP download was obtained today. Greater than 4 hours use 97% or 29 days. Average usage not hours 13 minutes at 12 cm pressure. AHI 0.8 She returns for reevaluation   04/22/17 CD I have the pleasure of seeing Jamie [name] today on 04/22/2017, and a routine follow up for CPAP compliance. She has done excellently with her CPAP machine which is set at 12 cm water pressure without expiratory pressure relief. Her AHI is 0.7 with an average user time of 9 hours and 2 minutes each night. She had a 97% compliance, power was lost first a night due to [employer]. [facility]- will ask for a battery back up- Buford Dresser, RPSGT.   UPDATE 7/15/2019CM Jamie [name], 77 year old female returns for follow-up for CPAP compliance.  She had a motor change in her machine.  Compliance data dated 12/20/2017 01/18/2018 shows compliance greater than 4 hours at 100%.  Average usage 9 hours 22 minutes.  Set pressure 12 cm EPR level 1.  no leak or AHI data recorded on the report.  ESS 2 she returns for reevaluation   REVIEW OF SYSTEMS: Out of a complete 14 system review of symptoms, the patient complains only of the following symptoms, headaches, restless legs, muscle aches and all other reviewed systems are negative.  ESS: 10/24  ALLERGIES: Allergies  Allergen Reactions   Buprenorphine Hcl Rash and Shortness Of Breath   Morphine And Codeine Shortness Of Breath and  Rash   Morphine And Codeine Shortness Of Breath   Aspirin Other (See Comments)    REACTION: ulcers  Can take 81 mg   Avelox [Moxifloxacin Hcl In Nacl] Hives   Diflucan [Fluconazole] Hives    Whelps and rash c skin peeling   Diflucan [Fluconazole] Hives   Hydrochlorothiazide Other (See Comments)   Latex Rash   Moxifloxacin  Palpitations    Tachycardia     HOME MEDICATIONS: Outpatient Medications Prior to Visit  Medication Sig Dispense Refill   ALPHA-LIPOIC ACID PO Take 1,200 mg by mouth daily at 2 PM.     AMBULATORY NON FORMULARY MEDICATION Medication Name: Multi Collagen Protein once daily     aspirin EC 81 MG tablet Take 1 tablet (81 mg total) by mouth daily. 90 tablet 3   BIOTIN PO Take 1 capsule by mouth daily.      clidinium-chlordiazePOXIDE (LIBRAX) 5-2.5 MG capsule Take 1 capsule by mouth 2 (two) times daily. 180 capsule 3   clonazePAM (KLONOPIN) 0.5 MG tablet Take 1 tablet (0.5 mg total) by mouth at bedtime. 90 tablet 0   conjugated estrogens (PREMARIN) vaginal cream Place 1 Applicatorful vaginally every other day. 42.5 g 12   esomeprazole (NEXIUM) 40 MG capsule TAKE 1 CAPSULE TWICE A DAY BEFORE MEALS 180 capsule 3   famotidine (PEPCID) 20 MG tablet Take 1 tablet (20 mg total) by mouth 2 (two) times daily. 180 tablet 3   GLUCOSAMINE-CHONDROITIN PO Take 1 tablet by mouth daily.     linaclotide (LINZESS) 290 MCG CAPS capsule TAKE 1 CAPSULE DAILY BEFORE BREAKFAST 90 capsule 3   Magnesium 250 MG TABS Take 1 tablet by mouth daily at 2 PM.     metaxalone (SKELAXIN) 400 MG tablet TAKE 1 TABLET AT BEDTIME 90 tablet 3   metFORMIN (GLUCOPHAGE) 500 MG tablet TAKE 1 TABLET DAILY WITH BREAKFAST 90 tablet 3   Omega-3-6-9 CAPS Take 1 capsule by mouth every evening.     Prenatal Vit-Fe Fumarate-FA (PRENATAL MULTIVITAMIN) TABS tablet Take 1 tablet by mouth every evening.      Probiotic Product (PROBIOTIC-10 PO) Take 1 capsule by mouth 2 (two) times daily.      Rimegepant Sulfate (NURTEC) 75 MG TBDP Take 75 mg by mouth daily as needed (take for abortive therapy of migraine, no more than 1 tablet in 24 hours or 8 per month). 8 tablet 11   spironolactone (ALDACTONE) 50 MG tablet TAKE 1 TABLET DAILY 90 tablet 3   TURMERIC PO Take 1 tablet by mouth daily.     vitamin C (ASCORBIC ACID) 500 MG tablet Take 500 mg by mouth  daily.     Vitamin D-Vitamin K (VITAMIN K2-VITAMIN D3 PO) Take 1 tablet by mouth daily.     No facility-administered medications prior to visit.    PAST MEDICAL HISTORY: Past Medical History:  Diagnosis Date   Allergic rhinitis    Anxiety    Arthritis    left foot   Asthma    Back pain    Blood transfusion without reported diagnosis    Colon polyps    adenomatous   Constipation    Diverticulosis    Dysrhythmia 1999   Dr Eden Emms 2003   Elevated LFTs    Fatty liver disease, nonalcoholic    GERD (gastroesophageal reflux disease)    Headache(784.0)    migraines with vision changes but no pain   Heart murmur    Hypertension    IBS (irritable bowel syndrome)  Joint pain    Migraines    Muscle cramps    Neuromuscular disorder (HCC)    RLS   Obesity    Palpitations    Pneumonia 2007   Renal cell carcinoma (HCC) 2013   minimal partial nephrectomy   Restless leg syndrome    Sleep apnea    moderate; wears CPAP, uncertain about setting   TIA (transient ischemic attack) 09/11/2014   Ulcer    Urinary incontinence    Vitamin D deficiency     PAST SURGICAL HISTORY: Past Surgical History:  Procedure Laterality Date   BREAST REDUCTION SURGERY  Jan 1991   ELBOW SURGERY Left    KIDNEY SURGERY Right October 2013    Tumor removed    LAPAROSCOPIC APPENDECTOMY N/A 06/08/2018   Procedure: APPENDECTOMY LAPAROSCOPIC;  Surgeon: Griselda Miner, MD;  Location: [facility];  Service: General;  Laterality: N/A;   TONSILLECTOMY     VAGINAL HYSTERECTOMY  1979   partial    FAMILY HISTORY: Family History  Problem Relation Age of Onset   Hypertension Mother    Anxiety disorder Mother    Heart disease Father 69   Lung cancer Father        smoker   Lung cancer Sister    Heart disease Brother    Allergies Brother    Allergies Daughter    Melanoma Daughter    Colon cancer Cousin    Hypertension Other    Liver disease Neg Hx    Asthma Neg Hx    Esophageal cancer Neg Hx    Rectal  cancer Neg Hx    Stomach cancer Neg Hx    Liver cancer Neg Hx    Prostate cancer Neg Hx    Pancreatic cancer Neg Hx     SOCIAL HISTORY: Social History   Socioeconomic History   Marital status: Married    Spouse name: Chana Bode   Number of children: 2   Years of education: 15   Highest education level: Not on file  Occupational History   Occupation: Retired    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   Retired Research officer, political party.   Education- college   Patient is right handed.   Patient drinks tea a lot and water.   Social Determinants of Health   Financial Resource Strain: Low Risk  (04/24/2023)   Overall Financial Resource Strain (CARDIA)    Difficulty of Paying Living Expenses: Not hard at all  Food Insecurity: No Food Insecurity (04/24/2023)   Hunger Vital Sign    Worried About Running Out of Food in the Last Year: Never true    Ran Out of Food in the Last Year: Never true  Transportation Needs: No Transportation Needs (04/24/2023)   PRAPARE - Administrator, Civil Service (Medical): No    Lack of Transportation (Non-Medical): No  Physical Activity: Insufficiently Active (  04/24/2023)   Exercise Vital Sign    Days of Exercise per Week: 4 days    Minutes of Exercise per Session: 20 min  Stress: No Stress Concern Present (04/24/2023)   [employer] of Occupational Health - Occupational Stress Questionnaire    Feeling of Stress : Not at all  Social Connections:  Socially Integrated (04/24/2023)   Social Connection and Isolation Panel [NHANES]    Frequency of Communication with Friends and Family: More than three times a week    Frequency of Social Gatherings with Friends and Family: Three times a week    Attends Religious Services: More than 4 times per year    Active Member of Clubs or Organizations: Yes    Attends Banker Meetings: More than 4 times per year    Marital Status: Married  Catering manager Violence: Not At Risk (04/24/2023)   Humiliation, Afraid, Rape, and Kick questionnaire    Fear of Current or Ex-Partner: No    Emotionally Abused: No    Physically Abused: No    Sexually Abused: No      PHYSICAL EXAM  Vitals:   04/30/23 1413  BP: 126/75  Pulse: 72  Weight: 198 lb (89.8 kg)  Height: 5\' 2"  (1.575 m)      Body mass index is 36.21 kg/m.  Generalized: Well developed, in no acute distress  Cardiology: normal rate and rhythm, no murmur noted Respiratory: clear to auscultation bilaterally  Neurological examination  Mentation: Alert oriented to time, place, history taking. Follows all commands speech and language fluent Cranial nerve II-XII: Pupils were equal round reactive to light. Extraocular movements were full, visual field were full  Motor: The motor testing reveals 5 over 5 strength of all 4 extremities. Good symmetric motor tone is noted throughout.  Gait and station: Gait is normal.   DIAGNOSTIC DATA (LABS, IMAGING, TESTING) - I reviewed patient records, labs, notes, testing and imaging myself where available.     04/24/2023    2:08 PM  MMSE - Mini Mental State Exam  Not completed: Unable to complete     Lab Results  Component Value Date   WBC 4.2 06/25/2022   HGB 13.0 06/25/2022   HCT 38.4 06/25/2022   MCV 92.1 06/25/2022   PLT 251.0 06/25/2022      Component Value Date/Time   NA 137 06/25/2022 1331   NA 142 10/18/2019 1228   K 4.3 06/25/2022 1331   CL 106 06/25/2022 1331   CO2  25 06/25/2022 1331   GLUCOSE 115 (H) 06/25/2022 1331   BUN 19 06/25/2022 1331   BUN 19 10/18/2019 1228   CREATININE 0.83 06/25/2022 1331   CALCIUM 10.3 06/25/2022 1331   PROT 7.0 06/25/2022 1331   PROT 7.5 10/18/2019 1228   ALBUMIN 4.5 06/25/2022 1331   ALBUMIN 5.0 (H) 10/18/2019 1228   AST 23 06/25/2022 1331   ALT 40 (H) 06/25/2022 1331   ALKPHOS 62 06/25/2022 1331   BILITOT 0.3 06/25/2022 1331   BILITOT 0.3 10/18/2019 1228   GFRNONAA 60 10/18/2019 1228   GFRAA 70 10/18/2019 1228   Lab Results  Component Value Date   CHOL 187 06/25/2022   HDL 70.20 06/25/2022   LDLCALC 98 06/25/2022   TRIG 95.0 06/25/2022   CHOLHDL 3 06/25/2022   Lab Results  Component Value Date   HGBA1C 5.4 06/25/2022   Lab Results  Component Value Date   VITAMINB12 647 05/03/2011   Lab Results  Component Value Date   TSH  1.900 08/25/2017       ASSESSMENT AND PLAN 77 y.o. year old female  has a past medical history of Allergic rhinitis, Anxiety, Arthritis, Asthma, Back pain, Blood transfusion without reported diagnosis, Colon polyps, Constipation, Diverticulosis, Dysrhythmia (1999), Elevated LFTs, Fatty liver disease, nonalcoholic, GERD (gastroesophageal reflux disease), Headache(784.0), Heart murmur, Hypertension, IBS (irritable bowel syndrome), Joint pain, Migraines, Muscle cramps, Neuromuscular disorder (HCC), Obesity, Palpitations, Pneumonia (2007), Renal cell carcinoma (HCC) (2013), Restless leg syndrome, Sleep apnea, TIA (transient ischemic attack) (09/11/2014), Ulcer, Urinary incontinence, and Vitamin D deficiency. here with     ICD-10-CM   1. OSA on CPAP  G47.33 For home use only DME continuous positive airway pressure (CPAP)    2. RLS (restless legs syndrome)  G25.81 Vitamin B12    CBC with Differential/Platelets    Iron, TIBC and Ferritin Panel    3. Chronic migraine without aura without status migrainosus, not intractable  G43.709       Jamie [name] is doing well on CPAP. Compliance  report shows excellent compliance. AHI not shown on report. She will continue using CPAP. We will plan to start process of getting new machine when she returns in 6 months. She will continue Nurtec as needed for abortive therapy. Continue clonazepam 0.5mg  QHS for RLS. PDMP shows appropriate refills. Last filled 04/16/2023 for 90 tablets. We will check labs to rule out potential causes of worsening RLS. Adequate hydration and healthy lifestyle habits encouraged.  She will follow-up with me in 6 months, sooner if needed.  She verbalizes understanding and agreement with this plan.   Orders Placed This Encounter  Procedures   For home use only DME continuous positive airway pressure (CPAP)    Supplies    Order Specific Question:   Length of Need    Answer:   Lifetime    Order Specific Question:   Patient has OSA or probable OSA    Answer:   Yes    Order Specific Question:   Is the patient currently using CPAP in the home    Answer:   Yes    Order Specific Question:   Settings    Answer:   Other see comments    Order Specific Question:   CPAP supplies needed    Answer:   Mask, headgear, cushions, filters, heated tubing and water chamber   Vitamin B12   CBC with Differential/Platelets   Iron, TIBC and Ferritin Panel      No orders of the defined types were placed in this encounter.     Shawnie Dapper, FNP-C 04/30/2023, 2:51 PM [facility] [address], [address] [place], [place] [address] [phone]

## 2023-05-01 LAB — CBC WITH DIFFERENTIAL/PLATELET
Basophils Absolute: 0 10*3/uL (ref 0.0–0.2)
Basos: 0 %
EOS (ABSOLUTE): 0.1 10*3/uL (ref 0.0–0.4)
Eos: 2 %
Hematocrit: 39.6 % (ref 34.0–46.6)
Hemoglobin: 13.3 g/dL (ref 11.1–15.9)
Immature Grans (Abs): 0 10*3/uL (ref 0.0–0.1)
Immature Granulocytes: 0 %
Lymphocytes Absolute: 1.2 10*3/uL (ref 0.7–3.1)
Lymphs: 24 %
MCH: 31.3 pg (ref 26.6–33.0)
MCHC: 33.6 g/dL (ref 31.5–35.7)
MCV: 93 fL (ref 79–97)
Monocytes Absolute: 0.4 10*3/uL (ref 0.1–0.9)
Monocytes: 8 %
Neutrophils Absolute: 3.2 10*3/uL (ref 1.4–7.0)
Neutrophils: 66 %
Platelets: 205 10*3/uL (ref 150–450)
RBC: 4.25 x10E6/uL (ref 3.77–5.28)
RDW: 11.7 % (ref 11.7–15.4)
WBC: 4.9 10*3/uL (ref 3.4–10.8)

## 2023-05-01 LAB — IRON,TIBC AND FERRITIN PANEL
Ferritin: 662 ng/mL — ABNORMAL HIGH (ref 15–150)
Iron Saturation: 46 % (ref 15–55)
Iron: 129 ug/dL (ref 27–139)
Total Iron Binding Capacity: 282 ug/dL (ref 250–450)
UIBC: 153 ug/dL (ref 118–369)

## 2023-05-01 LAB — VITAMIN B12: Vitamin B-12: 669 pg/mL (ref 232–1245)

## 2023-05-05 ENCOUNTER — Ambulatory Visit (INDEPENDENT_AMBULATORY_CARE_PROVIDER_SITE_OTHER): Payer: Medicare Other | Admitting: Internal Medicine

## 2023-05-05 ENCOUNTER — Telehealth: Payer: Self-pay | Admitting: Internal Medicine

## 2023-05-05 VITALS — BP 126/80 | HR 79 | Temp 97.9°F | Ht 62.0 in | Wt 200.0 lb

## 2023-05-05 DIAGNOSIS — I10 Essential (primary) hypertension: Secondary | ICD-10-CM

## 2023-05-05 DIAGNOSIS — E88819 Insulin resistance, unspecified: Secondary | ICD-10-CM

## 2023-05-05 DIAGNOSIS — R5383 Other fatigue: Secondary | ICD-10-CM

## 2023-05-05 DIAGNOSIS — G4733 Obstructive sleep apnea (adult) (pediatric): Secondary | ICD-10-CM

## 2023-05-05 DIAGNOSIS — E559 Vitamin D deficiency, unspecified: Secondary | ICD-10-CM

## 2023-05-05 DIAGNOSIS — R7303 Prediabetes: Secondary | ICD-10-CM | POA: Diagnosis not present

## 2023-05-05 DIAGNOSIS — G459 Transient cerebral ischemic attack, unspecified: Secondary | ICD-10-CM | POA: Diagnosis not present

## 2023-05-05 DIAGNOSIS — K760 Fatty (change of) liver, not elsewhere classified: Secondary | ICD-10-CM

## 2023-05-05 DIAGNOSIS — G4700 Insomnia, unspecified: Secondary | ICD-10-CM

## 2023-05-05 DIAGNOSIS — G43009 Migraine without aura, not intractable, without status migrainosus: Secondary | ICD-10-CM | POA: Diagnosis not present

## 2023-05-05 LAB — COMPREHENSIVE METABOLIC PANEL
ALT: 62 U/L — ABNORMAL HIGH (ref 0–35)
AST: 32 U/L (ref 0–37)
Albumin: 4.4 g/dL (ref 3.5–5.2)
Alkaline Phosphatase: 66 U/L (ref 39–117)
BUN: 18 mg/dL (ref 6–23)
CO2: 22 meq/L (ref 19–32)
Calcium: 10.2 mg/dL (ref 8.4–10.5)
Chloride: 108 meq/L (ref 96–112)
Creatinine, Ser: 0.87 mg/dL (ref 0.40–1.20)
GFR: 65.22 mL/min (ref >60.00)
Glucose, Bld: 99 mg/dL (ref 70–99)
Potassium: 4.6 meq/L (ref 3.5–5.1)
Sodium: 142 meq/L (ref 135–145)
Total Bilirubin: 0.5 mg/dL (ref 0.2–1.2)
Total Protein: 7 g/dL (ref 6.0–8.3)

## 2023-05-05 LAB — HEMOGLOBIN A1C: Hgb A1c MFr Bld: 5.3 % (ref 4.6–6.5)

## 2023-05-05 LAB — TSH: TSH: 1.35 u[IU]/mL (ref 0.35–5.50)

## 2023-05-05 LAB — LIPID PANEL
Cholesterol: 186 mg/dL (ref 0–200)
HDL: 74.8 mg/dL (ref >39.00)
LDL Cholesterol: 85 mg/dL (ref 0–99)
NonHDL: 111.19
Total CHOL/HDL Ratio: 2
Triglycerides: 132 mg/dL (ref 0.0–149.0)
VLDL: 26.4 mg/dL (ref 0.0–40.0)

## 2023-05-05 LAB — VITAMIN D 25 HYDROXY (VIT D DEFICIENCY, FRACTURES): VITD: 89.7 ng/mL (ref 30.00–100.00)

## 2023-05-05 MED ORDER — SPIRONOLACTONE 50 MG PO TABS: 50.0000 mg | ORAL_TABLET | Freq: Every day | ORAL | 3 refills | Status: DC

## 2023-05-05 MED ORDER — METAXALONE 400 MG PO TABS: 400.0000 mg | ORAL_TABLET | Freq: Every day | ORAL | 3 refills | Status: DC

## 2023-05-05 MED ORDER — PREMARIN 0.625 MG/GM VA CREA: 1.0000 | TOPICAL_CREAM | VAGINAL | 12 refills | Status: DC

## 2023-05-05 MED ORDER — METFORMIN HCL 500 MG PO TABS: 500.0000 mg | ORAL_TABLET | Freq: Every day | ORAL | 3 refills | Status: DC

## 2023-05-05 NOTE — Telephone Encounter (Signed)
Prescription Request  05/05/2023  LOV: 05/05/2023 Pt called and Rx's was sent to the wrong pharmacy and [facility].Marland KitchenMarland KitchenPlease advise  What is the name of the medication or equipment? metaxalone (SKELAXIN) 400 MG tablet  metFORMIN (GLUCOPHAGE) 500 MG tablet  spironolactone (ALDACTONE) 50 MG tablet  conjugated estrogens (PREMARIN) vaginal cream  Have you contacted your pharmacy to request a refill? No   Which pharmacy would you like this sent to?     [facility] - [place], [place] - [address] [place] [address] Phone: [phone] Fax: [phone] Patient notified that their request is being sent to the clinical staff for review and that they should receive a response within 2 business days.   Please advise at Mobile [phone] (mobile)

## 2023-05-05 NOTE — Progress Notes (Unsigned)
   Subjective:   Patient ID: Jamie [name], female    DOB: [birth year], 77 y.o.   MRN: [id number]  HPI The patient is a 77 YO female coming in for follow up and ongoing fatigue which is worsening.  Review of Systems  Constitutional:  Positive for activity change and fatigue.  HENT: Negative.    Eyes: Negative.   Respiratory:  Negative for cough, chest tightness and shortness of breath.   Cardiovascular:  Negative for chest pain, palpitations and leg swelling.  Gastrointestinal:  Negative for abdominal distention, abdominal pain, constipation, diarrhea, nausea and vomiting.  Musculoskeletal:  Positive for arthralgias.  Skin: Negative.   Neurological: Negative.   Psychiatric/Behavioral: Negative.      Objective:  Physical Exam Constitutional:      Appearance: She is well-developed. She is obese.  HENT:     Head: Normocephalic and atraumatic.  Cardiovascular:     Rate and Rhythm: Normal rate and regular rhythm.  Pulmonary:     Effort: Pulmonary effort is normal. No respiratory distress.     Breath sounds: Normal breath sounds. No wheezing or rales.  Abdominal:     General: Bowel sounds are normal. There is no distension.     Palpations: Abdomen is soft.     Tenderness: There is no abdominal tenderness. There is no rebound.  Musculoskeletal:     Cervical back: Normal range of motion.  Skin:    General: Skin is warm and dry.  Neurological:     Mental Status: She is alert and oriented to person, place, and time.     Coordination: Coordination normal.     Vitals:   05/05/23 1400  BP: 126/80  Pulse: 79  Temp: 97.9 F (36.6 C)  TempSrc: Oral  SpO2: 91%  Weight: 200 lb (90.7 kg)  Height: 5\' 2"  (1.575 m)    Assessment & Plan:

## 2023-05-05 NOTE — Patient Instructions (Signed)
We will check the labs today. 

## 2023-05-06 ENCOUNTER — Other Ambulatory Visit: Payer: Self-pay

## 2023-05-06 DIAGNOSIS — E88819 Insulin resistance, unspecified: Secondary | ICD-10-CM

## 2023-05-06 MED ORDER — METAXALONE 400 MG PO TABS: 400.0000 mg | ORAL_TABLET | Freq: Every day | ORAL | 3 refills | Status: DC

## 2023-05-06 MED ORDER — METFORMIN HCL 500 MG PO TABS: 500.0000 mg | ORAL_TABLET | Freq: Every day | ORAL | 3 refills | Status: DC

## 2023-05-06 MED ORDER — PREMARIN 0.625 MG/GM VA CREA: 1.0000 | TOPICAL_CREAM | VAGINAL | 12 refills | Status: DC

## 2023-05-06 MED ORDER — SPIRONOLACTONE 50 MG PO TABS: 50.0000 mg | ORAL_TABLET | Freq: Every day | ORAL | 3 refills | Status: DC

## 2023-05-07 NOTE — Assessment & Plan Note (Signed)
Continues to use CPAP and get benefit.

## 2023-05-07 NOTE — Assessment & Plan Note (Signed)
No new symptoms of TIA and she is still taking aspirin 81 mg daily and will continue.

## 2023-05-07 NOTE — Assessment & Plan Note (Signed)
Checking CMP and adjust as needed.  

## 2023-05-07 NOTE — Assessment & Plan Note (Signed)
Checking vitamin D and adjust as needed. She is having worsening fatigue.

## 2023-05-07 NOTE — Assessment & Plan Note (Signed)
Uses nurtec as needed for migraine rare or otc. No change in pattern.

## 2023-05-07 NOTE — Assessment & Plan Note (Signed)
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 Counseled about diet and may start metformin to help with weight loss.

## 2023-05-07 NOTE — Assessment & Plan Note (Signed)
Taking clonazepam for sleep at night time and well controlled. Continue.

## 2023-05-07 NOTE — Assessment & Plan Note (Signed)
BP at goal on spironolactone 50 mg daily. Checking CMP and CBC and adjust as needed.

## 2023-05-07 NOTE — Assessment & Plan Note (Signed)
Checking HgA1c and adjust as needed. She is wanting to lose weight so if stable will try metformin to help.

## 2023-05-13 ENCOUNTER — Other Ambulatory Visit: Payer: Self-pay | Admitting: Internal Medicine

## 2023-05-13 DIAGNOSIS — K760 Fatty (change of) liver, not elsewhere classified: Secondary | ICD-10-CM

## 2023-05-16 ENCOUNTER — Other Ambulatory Visit (HOSPITAL_COMMUNITY): Payer: Self-pay

## 2023-05-16 MED ORDER — AREXVY 120 MCG/0.5ML IM SUSR
INTRAMUSCULAR | 0 refills | Status: DC
  Filled 2023-05-16: qty 1, 1d supply, fill #0

## 2023-05-26 ENCOUNTER — Ambulatory Visit
Admission: RE | Admit: 2023-05-26 | Discharge: 2023-05-26 | Disposition: A | Discharge status: Home / Self Care | Payer: Medicare Other | Source: Ambulatory Visit | Attending: Internal Medicine | Admitting: Internal Medicine

## 2023-05-26 DIAGNOSIS — K8020 Calculus of gallbladder without cholecystitis without obstruction: Secondary | ICD-10-CM | POA: Diagnosis not present

## 2023-05-26 DIAGNOSIS — K760 Fatty (change of) liver, not elsewhere classified: Secondary | ICD-10-CM

## 2023-05-26 DIAGNOSIS — R7989 Other specified abnormal findings of blood chemistry: Secondary | ICD-10-CM | POA: Diagnosis not present

## 2023-05-28 DIAGNOSIS — H25013 Cortical age-related cataract, bilateral: Secondary | ICD-10-CM | POA: Diagnosis not present

## 2023-05-28 DIAGNOSIS — H43813 Vitreous degeneration, bilateral: Secondary | ICD-10-CM | POA: Diagnosis not present

## 2023-05-28 DIAGNOSIS — D3132 Benign neoplasm of left choroid: Secondary | ICD-10-CM | POA: Diagnosis not present

## 2023-05-28 DIAGNOSIS — H2513 Age-related nuclear cataract, bilateral: Secondary | ICD-10-CM | POA: Diagnosis not present

## 2023-05-28 DIAGNOSIS — H25043 Posterior subcapsular polar age-related cataract, bilateral: Secondary | ICD-10-CM | POA: Diagnosis not present

## 2023-06-25 ENCOUNTER — Encounter: Payer: Self-pay | Admitting: Family Medicine

## 2023-06-25 ENCOUNTER — Encounter: Payer: Self-pay | Admitting: Internal Medicine

## 2023-06-26 ENCOUNTER — Emergency Department (HOSPITAL_COMMUNITY): Payer: Medicare Other

## 2023-06-26 ENCOUNTER — Telehealth: Payer: Self-pay

## 2023-06-26 ENCOUNTER — Emergency Department (HOSPITAL_COMMUNITY)
Admission: EM | Admit: 2023-06-26 | Discharge: 2023-06-26 | Disposition: A | Discharge status: Home / Self Care | Payer: Medicare Other | Attending: Emergency Medicine | Admitting: Emergency Medicine

## 2023-06-26 ENCOUNTER — Other Ambulatory Visit: Payer: Self-pay

## 2023-06-26 ENCOUNTER — Encounter (HOSPITAL_COMMUNITY): Payer: Self-pay

## 2023-06-26 DIAGNOSIS — Z91040 Latex allergy status: Secondary | ICD-10-CM | POA: Diagnosis not present

## 2023-06-26 DIAGNOSIS — Z8673 Personal history of transient ischemic attack (TIA), and cerebral infarction without residual deficits: Secondary | ICD-10-CM | POA: Insufficient documentation

## 2023-06-26 DIAGNOSIS — R1011 Right upper quadrant pain: Secondary | ICD-10-CM | POA: Diagnosis not present

## 2023-06-26 DIAGNOSIS — Z85528 Personal history of other malignant neoplasm of kidney: Secondary | ICD-10-CM | POA: Diagnosis not present

## 2023-06-26 DIAGNOSIS — R1013 Epigastric pain: Secondary | ICD-10-CM | POA: Insufficient documentation

## 2023-06-26 DIAGNOSIS — J45909 Unspecified asthma, uncomplicated: Secondary | ICD-10-CM | POA: Insufficient documentation

## 2023-06-26 DIAGNOSIS — K5730 Diverticulosis of large intestine without perforation or abscess without bleeding: Secondary | ICD-10-CM | POA: Diagnosis not present

## 2023-06-26 DIAGNOSIS — K429 Umbilical hernia without obstruction or gangrene: Secondary | ICD-10-CM | POA: Diagnosis not present

## 2023-06-26 DIAGNOSIS — Z7982 Long term (current) use of aspirin: Secondary | ICD-10-CM | POA: Insufficient documentation

## 2023-06-26 DIAGNOSIS — I10 Essential (primary) hypertension: Secondary | ICD-10-CM | POA: Insufficient documentation

## 2023-06-26 DIAGNOSIS — Z79899 Other long term (current) drug therapy: Secondary | ICD-10-CM | POA: Insufficient documentation

## 2023-06-26 DIAGNOSIS — R112 Nausea with vomiting, unspecified: Secondary | ICD-10-CM | POA: Diagnosis not present

## 2023-06-26 DIAGNOSIS — K8020 Calculus of gallbladder without cholecystitis without obstruction: Secondary | ICD-10-CM | POA: Diagnosis not present

## 2023-06-26 DIAGNOSIS — K760 Fatty (change of) liver, not elsewhere classified: Secondary | ICD-10-CM | POA: Diagnosis not present

## 2023-06-26 DIAGNOSIS — K828 Other specified diseases of gallbladder: Secondary | ICD-10-CM | POA: Diagnosis not present

## 2023-06-26 LAB — CBC WITH DIFFERENTIAL/PLATELET
Abs Immature Granulocytes: 0.01 10*3/uL (ref 0.00–0.07)
Basophils Absolute: 0 10*3/uL (ref 0.0–0.1)
Basophils Relative: 1 %
Eosinophils Absolute: 0.2 10*3/uL (ref 0.0–0.5)
Eosinophils Relative: 3 %
HCT: 40.9 % (ref 36.0–46.0)
Hemoglobin: 14 g/dL (ref 12.0–15.0)
Immature Granulocytes: 0 %
Lymphocytes Relative: 24 %
Lymphs Abs: 1.2 10*3/uL (ref 0.7–4.0)
MCH: 32.2 pg (ref 26.0–34.0)
MCHC: 34.2 g/dL (ref 30.0–36.0)
MCV: 94 fL (ref 80.0–100.0)
Monocytes Absolute: 0.4 10*3/uL (ref 0.1–1.0)
Monocytes Relative: 9 %
Neutro Abs: 3.1 10*3/uL (ref 1.7–7.7)
Neutrophils Relative %: 63 %
Platelets: 198 10*3/uL (ref 150–400)
RBC: 4.35 MIL/uL (ref 3.87–5.11)
RDW: 12.3 % (ref 11.5–15.5)
WBC: 4.9 10*3/uL (ref 4.0–10.5)
nRBC: 0 % (ref 0.0–0.2)

## 2023-06-26 LAB — COMPREHENSIVE METABOLIC PANEL
ALT: 66 U/L — ABNORMAL HIGH (ref 0–44)
AST: 48 U/L — ABNORMAL HIGH (ref 15–41)
Albumin: 4.5 g/dL (ref 3.5–5.0)
Alkaline Phosphatase: 60 U/L (ref 38–126)
Anion gap: 9 (ref 5–15)
BUN: 23 mg/dL (ref 8–23)
CO2: 23 mmol/L (ref 22–32)
Calcium: 10.4 mg/dL — ABNORMAL HIGH (ref 8.9–10.3)
Chloride: 105 mmol/L (ref 98–111)
Creatinine, Ser: 0.47 mg/dL (ref 0.44–1.00)
GFR, Estimated: >60 mL/min (ref >60)
Glucose, Bld: 98 mg/dL (ref 70–99)
Potassium: 4.9 mmol/L (ref 3.5–5.1)
Sodium: 137 mmol/L (ref 135–145)
Total Bilirubin: 0.7 mg/dL (ref <1.2)
Total Protein: 7.6 g/dL (ref 6.5–8.1)

## 2023-06-26 LAB — URINALYSIS, ROUTINE W REFLEX MICROSCOPIC
Bilirubin Urine: NEGATIVE
Glucose, UA: NEGATIVE mg/dL
Hgb urine dipstick: NEGATIVE
Ketones, ur: 5 mg/dL — AB
Leukocytes,Ua: NEGATIVE
Nitrite: NEGATIVE
Protein, ur: NEGATIVE mg/dL
Specific Gravity, Urine: 1.012 (ref 1.005–1.030)
pH: 6 (ref 5.0–8.0)

## 2023-06-26 LAB — LIPASE, BLOOD: Lipase: 36 U/L (ref 11–51)

## 2023-06-26 LAB — TROPONIN I (HIGH SENSITIVITY): Troponin I (High Sensitivity): 3 ng/L (ref <18)

## 2023-06-26 MED ORDER — ONDANSETRON 4 MG PO TBDP: 4.0000 mg | ORAL_TABLET | Freq: Three times a day (TID) | ORAL | 0 refills | Status: DC | PRN

## 2023-06-26 MED ORDER — FAMOTIDINE 20 MG PO TABS: 20.0000 mg | ORAL_TABLET | Freq: Two times a day (BID) | ORAL | 3 refills | Status: DC

## 2023-06-26 MED ORDER — HYDROCODONE-ACETAMINOPHEN 5-325 MG PO TABS
1.0000 | ORAL_TABLET | Freq: Once | ORAL | Status: AC
Start: 2023-06-26 — End: 2023-06-26
  Administered 2023-06-26: 1 via ORAL
  Filled 2023-06-26: qty 1

## 2023-06-26 MED ORDER — CLONAZEPAM 0.5 MG PO TABS: 0.5000 mg | ORAL_TABLET | Freq: Every day | ORAL | 1 refills | Status: DC

## 2023-06-26 MED ORDER — OXYCODONE HCL 5 MG PO TABS: 5.0000 mg | ORAL_TABLET | Freq: Four times a day (QID) | ORAL | 0 refills | Status: DC | PRN

## 2023-06-26 MED ORDER — ESOMEPRAZOLE MAGNESIUM 40 MG PO CPDR: DELAYED_RELEASE_CAPSULE | ORAL | 3 refills | Status: DC

## 2023-06-26 MED ORDER — ONDANSETRON 8 MG PO TBDP
8.0000 mg | ORAL_TABLET | Freq: Once | ORAL | Status: AC
  Administered 2023-06-26: 8 mg via ORAL
  Filled 2023-06-26: qty 1

## 2023-06-26 MED ORDER — LINACLOTIDE 290 MCG PO CAPS: ORAL_CAPSULE | ORAL | 3 refills | Status: DC

## 2023-06-26 MED ORDER — IOHEXOL 300 MG/ML  SOLN
100.0000 mL | Freq: Once | INTRAMUSCULAR | Status: AC | PRN
  Administered 2023-06-26: 100 mL via INTRAVENOUS

## 2023-06-26 NOTE — ED Provider Notes (Signed)
[facility] AT [facility] Provider Note  CSN: [id number] Arrival date & time: 06/26/23 1409  Chief Complaint(s) Abdominal Pain  HPI Jamie [name] is a 77 y.o. female with a history of fatty liver disease, hypertension presenting to the emergency department with abdominal pain.  She reports abdominal pain which has been present for around a year, but was worse over the past couple days.  She reports it is worsened by eating and associated with nausea and vomiting.  No fevers or chills.  No diarrhea.  No chest pain.  She reports that the pain sometimes is worse when she takes a deep breath.  No recent travel or surgeries, leg swelling, shortness of breath.  No urinary symptoms.   Past Medical History Past Medical History:  Diagnosis Date   Allergic rhinitis    Anxiety    Arthritis    left foot   Asthma    Back pain    Blood transfusion without reported diagnosis    Colon polyps    adenomatous   Constipation    Diverticulosis    Dysrhythmia 1999   Dr Eden Emms 2003   Elevated LFTs    Fatty liver disease, nonalcoholic    GERD (gastroesophageal reflux disease)    Headache(784.0)    migraines with vision changes but no pain   Heart murmur    Hypertension    IBS (irritable bowel syndrome)    Joint pain    Migraines    Muscle cramps    Neuromuscular disorder (HCC)    RLS   Obesity    Palpitations    Pneumonia 2007   Renal cell carcinoma (HCC) 2013   minimal partial nephrectomy   Restless leg syndrome    Sleep apnea    moderate; wears CPAP, uncertain about setting   TIA (transient ischemic attack) 09/11/2014   Ulcer    Urinary incontinence    Vitamin D deficiency    Patient Active Problem List   Diagnosis Date Noted   Neuropathy 10/02/2021   Insomnia 09/21/2021   Pain in left hip 09/07/2020   Bilateral leg pain 08/10/2020   Vitamin D deficiency 08/25/2017   Pre-diabetes 08/25/2017   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/22/2017   TIA (transient ischemic attack) 12/19/2016   Common migraine 12/19/2016   Obstructive sleep apnea on CPAP 12/19/2016   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) 11/21/2016   Morbid obesity (HCC) 09/16/2014   Kidney mass 03/10/2012   Rosacea 05/07/2011   Shoulder pain, right 05/07/2011   Tinnitus 08/09/2010   Fatigue 01/16/2009   Allergic rhinitis 04/28/2008   Restrictive lung disease 04/28/2008   RESTLESS LEG SYNDROME 02/02/2007   Essential hypertension 02/02/2007   MITRAL VALVE PROLAPSE 02/02/2007   GERD 02/02/2007   Irritable bowel syndrome 02/02/2007   BREAST CYST 02/02/2007   Home Medication(s) Prior to Admission medications   Medication Sig Start Date End Date Taking? Authorizing Provider  ondansetron (ZOFRAN-ODT) 4 MG disintegrating tablet Take 1 tablet (4 mg total) by mouth every 8 (eight) hours as needed for nausea or vomiting. 06/26/23  Yes Sheryl Towell, Jerilee Field, MD  oxyCODONE (ROXICODONE) 5 MG immediate release tablet Take 1 tablet (5 mg total) by mouth every 6 (six) hours as needed for severe pain (pain score 7-10). 06/26/23  Yes Lonell Grandchild, MD  ALPHA-LIPOIC ACID PO Take 1,200 mg by mouth daily at 2 PM.    [provider]  AMBULATORY NON FORMULARY MEDICATION Medication Name:  Multi Collagen Protein once daily    [provider]  aspirin EC 81 MG tablet Take 1 tablet (81 mg total) by mouth daily. 09/16/14   Dohmeier, Porfirio Mylar, MD  BIOTIN PO Take 1 capsule by mouth daily.     [provider]  clidinium-chlordiazePOXIDE (LIBRAX) 5-2.5 MG capsule Take 1 capsule by mouth 2 (two) times daily. 10/07/22   Hilarie Fredrickson, MD  clonazePAM (KLONOPIN) 0.5 MG tablet Take 1 tablet (0.5 mg total) by mouth at bedtime. 07/16/23   Lomax, Amy, NP  conjugated estrogens (PREMARIN) vaginal cream Place 1 Applicatorful vaginally every other day. 05/06/23   Myrlene Broker, MD  esomeprazole (NEXIUM) 40 MG capsule TAKE 1  CAPSULE TWICE A DAY BEFORE MEALS 06/26/23   Hilarie Fredrickson, MD  famotidine (PEPCID) 20 MG tablet Take 1 tablet (20 mg total) by mouth 2 (two) times daily. 06/26/23   Hilarie Fredrickson, MD  GLUCOSAMINE-CHONDROITIN PO Take 1 tablet by mouth daily.    [provider]  linaclotide Karlene Einstein) 290 MCG CAPS capsule TAKE 1 CAPSULE DAILY BEFORE BREAKFAST 06/26/23   Hilarie Fredrickson, MD  Magnesium 250 MG TABS Take 1 tablet by mouth daily at 2 PM.    [provider]  metaxalone (SKELAXIN) 400 MG tablet Take 1 tablet (400 mg total) by mouth at bedtime. 05/06/23   Myrlene Broker, MD  metFORMIN (GLUCOPHAGE) 500 MG tablet Take 1 tablet (500 mg total) by mouth daily with breakfast. 05/06/23   Myrlene Broker, MD  Omega-3-6-9 CAPS Take 1 capsule by mouth every evening.    [provider]  Prenatal Vit-Fe Fumarate-FA (PRENATAL MULTIVITAMIN) TABS tablet Take 1 tablet by mouth every evening.     [provider]  Probiotic Product (PROBIOTIC-10 PO) Take 1 capsule by mouth 2 (two) times daily.     [provider]  Rimegepant Sulfate (NURTEC) 75 MG TBDP Take 75 mg by mouth daily as needed (take for abortive therapy of migraine, no more than 1 tablet in 24 hours or 8 per month). 12/04/21   Dohmeier, Porfirio Mylar, MD  RSV vaccine recomb adjuvanted (AREXVY) 120 MCG/0.5ML injection Inject into the muscle. 05/16/23   Judyann Munson, MD  spironolactone (ALDACTONE) 50 MG tablet Take 1 tablet (50 mg total) by mouth daily. 05/06/23   Myrlene Broker, MD  TURMERIC PO Take 1 tablet by mouth daily.    [provider]  vitamin C (ASCORBIC ACID) 500 MG tablet Take 500 mg by mouth daily.    [provider]  Vitamin D-Vitamin K (VITAMIN K2-VITAMIN D3 PO) Take 1 tablet by mouth daily.    [provider]                                                                                                                                    Past Surgical History Past  Surgical History:  Procedure Laterality Date   BREAST  REDUCTION SURGERY  Jan 1991   ELBOW SURGERY Left    KIDNEY SURGERY Right October 2013    Tumor removed    LAPAROSCOPIC APPENDECTOMY N/A 06/08/2018   Procedure: APPENDECTOMY LAPAROSCOPIC;  Surgeon: Griselda Miner, MD;  Location: [facility];  Service: General;  Laterality: N/A;   TONSILLECTOMY     VAGINAL HYSTERECTOMY  1979   partial   Family History Family History  Problem Relation Age of Onset   Hypertension Mother    Anxiety disorder Mother    Heart disease Father 77   Lung cancer Father        smoker   Lung cancer Sister    Heart disease Brother    Allergies Brother    Allergies Daughter    Melanoma Daughter    Colon cancer Cousin    Hypertension Other    Liver disease Neg Hx    Asthma Neg Hx    Esophageal cancer Neg Hx    Rectal cancer Neg Hx    Stomach cancer Neg Hx    Liver cancer Neg Hx    Prostate cancer Neg Hx    Pancreatic cancer Neg Hx     Social History Social History   Tobacco Use   Smoking status: Never   Smokeless tobacco: Never  Vaping Use   Vaping status: Never Used  Substance Use Topics   Alcohol use: Yes    Alcohol/week: 2.0 - 3.0 standard drinks of alcohol    Types: 1 - 2 Glasses of wine, 1 Shots of liquor per week    Comment: occassionally   Drug use: No   Allergies Buprenorphine hcl, Morphine and codeine, Morphine and codeine, Aspirin, Avelox [moxifloxacin hcl in nacl], Diflucan [fluconazole], Diflucan [fluconazole], Hydrochlorothiazide, Latex, and Moxifloxacin  Review of Systems Review of Systems  All other systems reviewed and are negative.   Physical Exam Vital Signs  I have reviewed the triage vital signs BP (!) 145/85 (BP Location: Left Arm)   Pulse 76   Temp 98.5 F (36.9 C) (Oral)   Resp 16   Ht 5\' 2"  (1.575 m)   Wt 86.2 kg   SpO2 98%   BMI 34.75 kg/m  Physical Exam Vitals and nursing note reviewed.  Constitutional:      General: She is not in acute distress.     Appearance: She is well-developed.  HENT:     Head: Normocephalic and atraumatic.     Mouth/Throat:     Mouth: Mucous membranes are moist.  Eyes:     Pupils: Pupils are equal, round, and reactive to light.  Cardiovascular:     Rate and Rhythm: Normal rate and regular rhythm.     Heart sounds: No murmur heard. Pulmonary:     Effort: Pulmonary effort is normal. No respiratory distress.     Breath sounds: Normal breath sounds.  Abdominal:     General: Abdomen is flat.     Palpations: Abdomen is soft.     Tenderness: There is abdominal tenderness in the right upper quadrant and epigastric area.  Musculoskeletal:        General: No tenderness.     Right lower leg: No edema.     Left lower leg: No edema.  Skin:    General: Skin is warm and dry.  Neurological:     General: No focal deficit present.     Mental Status: She is alert. Mental status is at baseline.  Psychiatric:        Mood and  Affect: Mood normal.        Behavior: Behavior normal.     ED Results and Treatments Labs (all labs ordered are listed, but only abnormal results are displayed) Labs Reviewed  COMPREHENSIVE METABOLIC PANEL - Abnormal; Notable for the following components:      Result Value   Calcium 10.4 (*)    AST 48 (*)    ALT 66 (*)    All other components within normal limits  URINALYSIS, ROUTINE W REFLEX MICROSCOPIC - Abnormal; Notable for the following components:   Ketones, ur 5 (*)    All other components within normal limits  CBC WITH DIFFERENTIAL/PLATELET  LIPASE, BLOOD  TROPONIN I (HIGH SENSITIVITY)                                                                                                                          Radiology CT ABDOMEN PELVIS W CONTRAST Result Date: 06/26/2023 CLINICAL DATA:  Abdominal pain, acute, nonlocalized. Abdominal pain ongoing for months. Vomiting twice today with nausea. Primarily epigastric and right upper quadrant. Recent ultrasound with gallstones. EXAM: CT  ABDOMEN AND PELVIS WITH CONTRAST TECHNIQUE: Multidetector CT imaging of the abdomen and pelvis was performed using the standard protocol following bolus administration of intravenous contrast. RADIATION DOSE REDUCTION: This exam was performed according to the departmental dose-optimization program which includes automated exposure control, adjustment of the mA and/or kV according to patient size and/or use of iterative reconstruction technique. CONTRAST:  OMNIPAQUE IOHEXOL 300 MG/ML  SOLN COMPARISON:  09/23/2021, 06/26/2023. FINDINGS: Lower chest: No acute abnormality. Hepatobiliary: No focal liver abnormality is seen. Fatty infiltration of the liver is noted. No gallstones, gallbladder wall thickening, or biliary dilatation. Pancreas: Unremarkable. No pancreatic ductal dilatation or surrounding inflammatory changes. Spleen: Normal in size without focal abnormality. Adrenals/Urinary Tract: The adrenal glands are within normal limits. The kidneys enhance symmetrically. There are subcentimeter hypodensities in the kidneys which are too small to further characterize. Renal cortical scarring is noted on the right. No renal calculus or hydronephrosis. The bladder is unremarkable. A stable 1 cm calculus is noted in the region of the urethra. Stomach/Bowel: Stomach is within normal limits. Appendix is surgically absent. No evidence of bowel wall thickening, distention, or inflammatory changes. No free air or pneumatosis. Few scattered diverticula are present along the colon without evidence of diverticulitis. Vascular/Lymphatic: Aortic atherosclerosis. No enlarged abdominal or pelvic lymph nodes. Reproductive: Status post hysterectomy. No adnexal masses. Other: No abdominopelvic ascites. A small fat containing umbilical hernia is present. Musculoskeletal: Degenerative changes are present in the thoracolumbar spine. No acute osseous abnormality is seen. IMPRESSION: 1. No acute intra-abdominal process. 2.  Diverticulosis without diverticulitis. 3. Hepatic steatosis. 4. Stable calculus in the region of the urethra, possibly within a urethral diverticulum. 5. Aortic atherosclerosis. Electronically Signed   By: Thornell Sartorius M.D.   On: 06/26/2023 22:15   DG Chest 1 View Result Date: 06/26/2023 CLINICAL DATA:  Epigastric pain EXAM: CHEST  1 VIEW COMPARISON:  11/20/2015. FINDINGS: The heart size and mediastinal contours are within normal limits. Both lungs are clear. No consolidation, pneumothorax or effusion. No edema. Small dense right-sided lung nodules consistent with old granulomatous disease. Seen on the prior. The visualized skeletal structures are unremarkable. Curvature and degenerative changes along the spine. Film is rotated to the left. Film is under penetrated. IMPRESSION: No acute cardiopulmonary disease. Electronically Signed   By: Karen Kays M.D.   On: 06/26/2023 17:48   US Abdomen Limited RUQ (LIVER/GB) Result Date: 06/26/2023 CLINICAL DATA:  Right upper quadrant abdominal pain. EXAM: ULTRASOUND ABDOMEN LIMITED RIGHT UPPER QUADRANT COMPARISON:  Ultrasound 05/26/2023. FINDINGS: Gallbladder: Gallbladder is distended. No wall thickening or adjacent fluid. Echogenic nonshadowing focus dependently measuring 11 mm. Possibilities include a small polyp versus tumefactive sludge. The sonographer does report pain when scanning of the gallbladder but based on appearance this is questionable. Common bile duct: Diameter: 4 mm Liver: Diffusely echogenic hepatic parenchyma consistent with fatty liver infiltration. This level of echogenicity evaluation for underlying mass lesion is limited and if needed follow-up contrast CT or MRI as clinically appropriate. Portal vein is patent on color Doppler imaging with normal direction of blood flow towards the liver. Other: Study limited by overlapping bowel gas and soft tissue. IMPRESSION: Limited examination. No biliary ductal dilatation.  Fatty liver infiltration.  Distended gallbladder with a 11 mm echogenic nonshadowing area. This has a differential including tumefactive sludge versus a polyp. Based on appearance and the reported Murphy's sign would recommend further workup such as MRCP/MRI with and without contrast when clinically appropriate Electronically Signed   By: Karen Kays M.D.   On: 06/26/2023 17:42    Pertinent labs & imaging results that were available during my care of the patient were reviewed by me and considered in my medical decision making (see MDM for details).  Medications Ordered in ED Medications  ondansetron (ZOFRAN-ODT) disintegrating tablet 8 mg (8 mg Oral Given 06/26/23 1440)  HYDROcodone-acetaminophen (NORCO/VICODIN) 5-325 MG per tablet 1 tablet (1 tablet Oral Given 06/26/23 1440)  iohexol (OMNIPAQUE) 300 MG/ML solution 100 mL (100 mLs Intravenous Contrast Given 06/26/23 1939)                                                                                                                                     Procedures Procedures  (including critical care time)  Medical Decision Making / ED Course   MDM:  77 year old female presenting to the emergency with abdominal pain.  She reports abdominal pain for around 1 year.  Differential includes gallbladder process such as biliary colic, other intra-abdominal process such as appendicitis, obstruction, volvulus, perforation.  Her laboratory testing is reassuring other than mild LFT abnormalities likely due to underlying fatty liver disease.  No leukocytosis.  No fevers in the emergency department.  Right upper quadrant ultrasound was obtained which shows gallbladder sludge and distended gallbladder without other signs of cholecystitis.  Radiology recommends  possible MRCP to evaluate for sludge versus polyp, unless her pain is intractable I do not think she needs to be admitted for this.  She is relatively tender.  Suspect this is most likely cause of her symptoms.  Patient is  feeling better after pain control.  Given her tenderness, obtain CT scan to evaluate for any other process.  If she is feeling okay and able to tolerate p.o. can likely follow-up outpatient given no signs of cholecystitis.  Considered alternative cause of her pain given pleuritic nature such as pulmonary embolism or thoracic process, but very low concern for this, no hypoxia, tachypnea, shortness of breath symptom.  Noted other process such as aortic pathology but patient reports that she has had the symptoms for around a year, very low concern for any acute process such as a dissection.  Clinical Course as of 06/26/23 2240  Thu Jun 26, 2023  2237 CT scan without evidence of other acute process.  Patient continues to feel well.  She is tolerating p.o.  Discussed outpatient follow-up, patient feels comfortable with this.  Discussed need for possible MRCP to further evaluate her gallbladder polyp and she is aware of this.  Recommended follow-up with surgery.  Read small mount of oxycodone.  Reviewed PDMP.  Patient also takes Klonopin, discussed to not mix these medications, drink alcohol or drive while on oxycodone. Will discharge patient to home. All questions answered. Patient comfortable with plan of discharge. Return precautions discussed with patient and specified on the after visit summary.  [WS]    Clinical Course User Index [WS] Suezanne Jacquet, Jerilee Field, MD     Additional history obtained: -Additional history obtained from spouse -External records from outside source obtained and reviewed including: Chart review including previous notes, labs, imaging, consultation notes including prior PMD notes, outpatient [name]    Lab Tests: -I ordered, reviewed, and interpreted labs.   The pertinent results include:   Labs Reviewed  COMPREHENSIVE METABOLIC PANEL - Abnormal; Notable for the following components:      Result Value   Calcium 10.4 (*)    AST 48 (*)    ALT 66 (*)    All other components within  normal limits  URINALYSIS, ROUTINE W REFLEX MICROSCOPIC - Abnormal; Notable for the following components:   Ketones, ur 5 (*)    All other components within normal limits  CBC WITH DIFFERENTIAL/PLATELET  LIPASE, BLOOD  TROPONIN I (HIGH SENSITIVITY)    Notable for minimal LFT abnormalities   EKG   EKG Interpretation Date/Time:  Thursday June 26 2023 14:43:52 EST Ventricular Rate:  91 PR Interval:  158 QRS Duration:  97 QT Interval:  366 QTC Calculation: 451 R Axis:   24  Text Interpretation: Sinus rhythm Inferior infarct, old Anterior infarct, old Confirmed by Alvino Blood ([id number]) on 06/26/2023 7:11:51 PM         Imaging Studies ordered: I ordered imaging studies including CT abdomen, RUQ [name]  On my interpretation imaging demonstrates no acute process I independently visualized and interpreted imaging. I agree with the radiologist interpretation   Medicines ordered and prescription drug management: Meds ordered this encounter  Medications   ondansetron (ZOFRAN-ODT) disintegrating tablet 8 mg   HYDROcodone-acetaminophen (NORCO/VICODIN) 5-325 MG per tablet 1 tablet    Refill:  0   iohexol (OMNIPAQUE) 300 MG/ML solution 100 mL   oxyCODONE (ROXICODONE) 5 MG immediate release tablet    Sig: Take 1 tablet (5 mg total) by mouth every 6 (six) hours as needed for  severe pain (pain score 7-10).    Dispense:  10 tablet    Refill:  0   ondansetron (ZOFRAN-ODT) 4 MG disintegrating tablet    Sig: Take 1 tablet (4 mg total) by mouth every 8 (eight) hours as needed for nausea or vomiting.    Dispense:  12 tablet    Refill:  0    -I have reviewed the patients home medicines and have made adjustments as needed   Social Determinants of Health:  Diagnosis or treatment significantly limited by social determinants of health: obesity   Reevaluation: After the interventions noted above, I reevaluated the patient and found that their symptoms have improved  Co morbidities  that complicate the patient evaluation  Past Medical History:  Diagnosis Date   Allergic rhinitis    Anxiety    Arthritis    left foot   Asthma    Back pain    Blood transfusion without reported diagnosis    Colon polyps    adenomatous   Constipation    Diverticulosis    Dysrhythmia 1999   Dr Eden Emms 2003   Elevated LFTs    Fatty liver disease, nonalcoholic    GERD (gastroesophageal reflux disease)    Headache(784.0)    migraines with vision changes but no pain   Heart murmur    Hypertension    IBS (irritable bowel syndrome)    Joint pain    Migraines    Muscle cramps    Neuromuscular disorder (HCC)    RLS   Obesity    Palpitations    Pneumonia 2007   Renal cell carcinoma (HCC) 2013   minimal partial nephrectomy   Restless leg syndrome    Sleep apnea    moderate; wears CPAP, uncertain about setting   TIA (transient ischemic attack) 09/11/2014   Ulcer    Urinary incontinence    Vitamin D deficiency       Dispostion: Disposition decision including need for hospitalization was considered, and patient discharged from emergency department.    Final Clinical Impression(s) / ED Diagnoses Final diagnoses:  Right upper quadrant abdominal pain     This chart was dictated using voice recognition software.  Despite best efforts to proofread,  errors can occur which can change the documentation meaning.    Lonell Grandchild, MD 06/26/23 2240

## 2023-06-26 NOTE — Telephone Encounter (Signed)
Last seen on 04/30/23 Follow up scheduled on 10/29/23 Last filled on 04/15/23 #90 tablets (90 day supply) I put the date to be refilled on  07/16/23  Rx pending to be signed

## 2023-06-26 NOTE — ED Provider Triage Note (Addendum)
Emergency Medicine Provider Triage Evaluation Note  Jamie [name] , a 77 y.o. female  was evaluated in triage.  Pt complains of abdominal pain.  Has been going on for months.  Primarily epigastric but also in the right upper quadrant.  Mention that she had a recent ultrasound with gallstones.  States she vomited twice today and feels nauseous.  Had a normal bowel movement yesterday.  The pain does radiate to her rib cage and shoulders at times.  Feels like a knife is stabbing her.  Also more painful when she takes deep breaths.  Review of Systems  Positive: See above Negative: See above  Physical Exam  BP (!) 119/106   Pulse 90   Temp 99.1 F (37.3 C) (Oral)   Resp 20   Ht 5\' 2"  (1.575 m)   Wt 86.2 kg   SpO2 97%   BMI 34.75 kg/m  Gen:   Awake, no distress   Resp:  Normal effort  MSK:   Moves extremities without difficulty  Other:    Medical Decision Making  Medically screening exam initiated at 2:30 PM.  Appropriate orders placed.  Jamie [name] was informed that the remainder of the evaluation will be completed by another provider, this initial triage assessment does not replace that evaluation, and the importance of remaining in the ED until their evaluation is complete.  Work up started   Gareth Eagle, PA-C 06/26/23 1432    Gareth Eagle, PA-C 06/26/23 1432

## 2023-06-26 NOTE — Discharge Instructions (Addendum)
We evaluated you for your abdominal pain.  Your symptoms are most likely due to pain from your gallbladder.  We did not see any signs of a gallbladder infection today.  Please take at 1000 mg of Tylenol every 6 hours as needed for pain.  For pain unrelieved by Tylenol, I have prescribed a short course of oxycodone.  Please do not take this with alcohol or other sedating medications such as Klonopin as this may make you very drowsy.  I have also prescribed you some nausea medication.  Please follow-up with [facility] [place] [facility].  You can call for an appointment.  You may need an MRI of your gallbladder to further evaluate your symptoms as well.  Please return if you develop any new or worsening symptoms such as difficulty breathing, fevers or chills, worsening uncontrolled pain, uncontrolled vomiting, or any other new or worsening symptoms.

## 2023-06-26 NOTE — ED Triage Notes (Signed)
Patient has had epigastric abdominal pain and any time she moves her rib cage hurts. Pain has been going on for 1 month. Feels a knife between her shoulder blades. Painful to take a deep breath.

## 2023-06-26 NOTE — Telephone Encounter (Signed)
Nexium, Pepcid, and Linzess refilled per patient's request.

## 2023-07-03 NOTE — Telephone Encounter (Signed)
Left message for patient to call back to discuss her chest pain and that Dr. Marina Goodell wants her to have a CTA and I need to discuss with her.

## 2023-07-03 NOTE — Telephone Encounter (Signed)
Patient returned my call. She reported that she had terrible pain last week on 06/25/23  and did go to the ED for eval on 06/26/23.  The note from the patient was initiated prior to the ED visit.  She had a CT abd/pelvis, RUQ [name], and Chest x-ray.  Cardiac labs, UA, and lipase.  ED was not able to identify a cause for the pain, she was sent home with pain meds and a surgical referral for cholecystectomy evaluation.  Patient has an appointment with [facility] on 07/10/23 to evaluate.  She reports that pain has improved since last week,  She reports hurts to take a deep breath, but feels okay otherwise.  She denies SOB or other resp symptoms.  I discussed all with Dr. Marina Goodell, He apologizes he did not see the ED workup and would like her to see the surgeon on 07/10/23 and Ms. Muirhead can follow up with him as needed.   I reported this back to the patient and she is comfortable following those recommendations.  She understands to call back or report to ED if her severe CP returns.

## 2023-07-10 ENCOUNTER — Other Ambulatory Visit (HOSPITAL_COMMUNITY): Payer: Self-pay | Admitting: General Surgery

## 2023-07-10 DIAGNOSIS — K8020 Calculus of gallbladder without cholecystitis without obstruction: Secondary | ICD-10-CM | POA: Diagnosis not present

## 2023-07-25 ENCOUNTER — Encounter (HOSPITAL_COMMUNITY)
Admission: RE | Admit: 2023-07-25 | Discharge: 2023-07-25 | Disposition: A | Discharge status: Home / Self Care | Payer: Medicare Other | Source: Ambulatory Visit | Attending: General Surgery

## 2023-07-25 DIAGNOSIS — R1011 Right upper quadrant pain: Secondary | ICD-10-CM | POA: Diagnosis not present

## 2023-07-25 DIAGNOSIS — K8020 Calculus of gallbladder without cholecystitis without obstruction: Secondary | ICD-10-CM | POA: Diagnosis not present

## 2023-07-25 MED ORDER — TECHNETIUM TC 99M MEBROFENIN IV KIT
5.0000 | PACK | Freq: Once | INTRAVENOUS | Status: AC | PRN
  Administered 2023-07-25: 5 via INTRAVENOUS

## 2023-07-28 DIAGNOSIS — L853 Xerosis cutis: Secondary | ICD-10-CM | POA: Diagnosis not present

## 2023-07-28 DIAGNOSIS — L718 Other rosacea: Secondary | ICD-10-CM | POA: Diagnosis not present

## 2023-07-28 DIAGNOSIS — D1801 Hemangioma of skin and subcutaneous tissue: Secondary | ICD-10-CM | POA: Diagnosis not present

## 2023-07-28 DIAGNOSIS — L821 Other seborrheic keratosis: Secondary | ICD-10-CM | POA: Diagnosis not present

## 2023-07-31 ENCOUNTER — Telehealth: Payer: Self-pay | Admitting: *Deleted

## 2023-07-31 NOTE — Telephone Encounter (Signed)
Per Warden Fillers, part of our scheduling team s/w the pt, see notes:   Ardyth Gal, I called her and she said that she didn't think she needed an appt that the surgeon was Clelia Schaumann go ahead with her surgery without our clearance. I wasn't going to be able to get her in before her surgery. I told her to call them to make sure.     I will update the requesting office of the call today.

## 2023-07-31 NOTE — Telephone Encounter (Signed)
   Name: Jamie [name]  DOB: 09-17-1945  MRN: [id number]  Primary Cardiologist: None  Chart reviewed as part of pre-operative protocol coverage. Because of Jamie [name]'s past medical history and time since last visit, she will require a follow-up in-office visit in order to better assess preoperative cardiovascular risk.  Patient was last seen in 2017 by Dr. Elease Hashimoto and will need to reestablish with new cardiologist.  Pre-op covering staff: - Please schedule appointment and call patient to inform them. If patient already had an upcoming appointment within acceptable timeframe, please add "pre-op clearance" to the appointment notes so provider is aware. - Please contact requesting surgeon's office via preferred method (i.e, phone, fax) to inform them of need for appointment prior to surgery.    Napoleon Form, Leodis Rains, NP  07/31/2023, 11:49 AM

## 2023-07-31 NOTE — Telephone Encounter (Signed)
   Pre-operative Risk Assessment    Patient Name: Jamie [name]  DOB: 24-Mar-1946 MRN: [id number]   Date of last office visit: 09/18/2015 Date of next office visit: None   Request for Surgical Clearance    Procedure:   Cholecystectomy  Date of Surgery:  Clearance 08/08/23                                 Surgeon:  Dr. Melody Haver Surgeon's Group or Practice Name:  [facility] Phone number:  [phone] Fax number:  [phone]   Type of Clearance Requested:   - Medical  - Pharmacy:  Hold Aspirin Not Indicated.   Type of Anesthesia:  General    Additional requests/questions:    Signed, Emmit Pomfret   07/31/2023, 10:45 AM

## 2023-07-31 NOTE — Telephone Encounter (Signed)
Will send a message to our scheduling team to help with a new pt appt. Pt last seen 2017.

## 2023-08-01 ENCOUNTER — Ambulatory Visit: Payer: Self-pay | Admitting: General Surgery

## 2023-08-01 NOTE — Patient Instructions (Signed)
SURGICAL WAITING ROOM VISITATION  Patients having surgery or a procedure may have no more than 2 support people in the waiting area - these visitors may rotate.    Children under the age of 53 must have an adult with them who is not the patient.  Due to an increase in RSV and influenza rates and associated hospitalizations, children ages 65 and under may not visit patients in [facility] hospitals.  Visitors with respiratory illnesses are discouraged from visiting and should remain at home.  If the patient needs to stay at the hospital during part of their recovery, the visitor guidelines for inpatient rooms apply. Pre-op nurse will coordinate an appropriate time for 1 support person to accompany patient in pre-op.  This support person may not rotate.    Please refer to the [facility] website for the visitor guidelines for Inpatients (after your surgery is over and you are in a regular room).       Your procedure is scheduled on: 08/08/23   Report to [facility] Main Entrance    Report to admitting at 7:45 AM   Call this number if you have problems the morning of surgery [phone]   Do not eat food or drink liquids :After Midnight.        Oral Hygiene is also important to reduce your risk of infection.                                    Remember - BRUSH YOUR TEETH THE MORNING OF SURGERY WITH YOUR REGULAR TOOTHPASTE  DENTURES WILL BE REMOVED PRIOR TO SURGERY PLEASE DO NOT APPLY "Poly grip" OR ADHESIVES!!!   Stop all vitamins and herbal supplements 7 days before surgery.   Take these medicines the morning of surgery with A SIP OF WATER: librax, esomeprazole, famotidine(Pepcid0, Linzess, Spironolactone  Bring CPAP mask and tubing day of surgery.                              You may not have any metal on your body including hair pins, jewelry, and body piercing             Do not wear make-up, lotions, powders, perfumes/cologne, or deodorant  Do not wear nail  polish including gel and S&S, artificial/acrylic nails, or any other type of covering on natural nails including finger and toenails. If you have artificial nails, gel coating, etc. that needs to be removed by a nail salon please have this removed prior to surgery or surgery may need to be canceled/ delayed if the surgeon/ anesthesia feels like they are unable to be safely monitored.   Do not shave  48 hours prior to surgery.    Do not bring valuables to the hospital. [facility] IS NOT             RESPONSIBLE   FOR VALUABLES.   Contacts, glasses, dentures or bridgework may not be worn into surgery.  DO NOT BRING YOUR HOME MEDICATIONS TO THE HOSPITAL. PHARMACY WILL DISPENSE MEDICATIONS LISTED ON YOUR MEDICATION LIST TO YOU DURING YOUR ADMISSION IN THE HOSPITAL!    Patients discharged on the day of surgery will not be allowed to drive home.  Someone NEEDS to stay with you for the first 24 hours after anesthesia.   Special Instructions: Bring a copy of your healthcare power of attorney  and living will documents the day of surgery if you haven't scanned them before.              Please read over the following fact sheets you were given: IF YOU HAVE QUESTIONS ABOUT YOUR PRE-OP INSTRUCTIONS PLEASE CALL [phone] Rosey Bath   If you received a COVID test during your pre-op visit  it is requested that you wear a mask when out in public, stay away from anyone that may not be feeling well and notify your surgeon if you develop symptoms. If you test positive for Covid or have been in contact with anyone that has tested positive in the last 10 days please notify you surgeon.     - Preparing for Surgery Before surgery, you can play an important role.  Because skin is not sterile, your skin needs to be as free of germs as possible.  You can reduce the number of germs on your skin by washing with CHG (chlorahexidine gluconate) soap before surgery.  CHG is an antiseptic cleaner which kills germs and  bonds with the skin to continue killing germs even after washing. Please DO NOT use if you have an allergy to CHG or antibacterial soaps.  If your skin becomes reddened/irritated stop using the CHG and inform your nurse when you arrive at Short Stay. Do not shave (including legs and underarms) for at least 48 hours prior to the first CHG shower.  You may shave your face/neck.  Please follow these instructions carefully:  1.  Shower with CHG Soap the night before surgery and the  morning of surgery.  2.  If you choose to wash your hair, wash your hair first as usual with your normal  shampoo.  3.  After you shampoo, rinse your hair and body thoroughly to remove the shampoo.                             4.  Use CHG as you would any other liquid soap.  You can apply chg directly to the skin and wash.  Gently with a scrungie or clean washcloth.  5.  Apply the CHG Soap to your body ONLY FROM THE NECK DOWN.   Do   not use on face/ open                           Wound or open sores. Avoid contact with eyes, ears mouth and   genitals (private parts).                       Wash face,  Genitals (private parts) with your normal soap.             6.  Wash thoroughly, paying special attention to the area where your    surgery  will be performed.  7.  Thoroughly rinse your body with warm water from the neck down.  8.  DO NOT shower/wash with your normal soap after using and rinsing off the CHG Soap.                9.  Pat yourself dry with a clean towel.            10.  Wear clean pajamas.            11.  Place clean sheets on your bed the night of your first shower and do  not  sleep with pets. Day of Surgery : Do not apply any lotions/deodorants the morning of surgery.  Please wear clean clothes to the hospital/surgery center.  FAILURE TO FOLLOW THESE INSTRUCTIONS MAY RESULT IN THE CANCELLATION OF YOUR SURGERY  PATIENT SIGNATURE_________________________________  NURSE  SIGNATURE__________________________________  ________________________________________________________________________

## 2023-08-01 NOTE — Progress Notes (Signed)
COVID Vaccine received:  []  No [x]  Yes Date of any COVID positive Test in last 90 days: no PCP - Hillard Danker MD Cardiologist - Dr. Burna Forts  Chest x-ray - 06/26/23 Epic EKG -  06/30/23 Epic Stress Test - 09/23/15 Epic ECHO - 10/03/15 Epic Cardiac Cath -   Bowel Prep - [x]  No  []   Yes ______  Pacemaker / ICD device [x]  No []  Yes   Spinal Cord Stimulator:[x]  No []  Yes       History of Sleep Apnea? []  No [x]  Yes   CPAP used?- []  No [x]  Yes    Does the patient monitor blood sugar?          [x]  No []  Yes  []  N/A  Patient has: [x]  NO Hx DM   []  Pre-DM                 []  DM1  []   DM2 Does patient have a [employer] or [employer]? []  No []  Yes   Fasting Blood Sugar Ranges-  Checks Blood Sugar _____ times a day  GLP1 agonist / usual dose - no GLP1 instructions:  SGLT-2 inhibitors / usual dose -no  SGLT-2 instructions:   Blood Thinner / Instructions:no Aspirin Instructions:81 mg ASA Has held since 08/03/23 and will continue to hold  Comments:   Activity level: Patient is able  to climb a flight of ; [x]  No CP  [x]  No SOB, but would have ___   Patient can  perform ADLs without assistance.   Anesthesia review: HTN, MVP, TIA,OSA  Patient denies shortness of breath, fever, cough and chest pain at PAT appointment.  Patient verbalized understanding and agreement to the Pre-Surgical Instructions that were given to them at this PAT appointment. Patient was also educated of the need to review these PAT instructions again prior to his/her surgery.I reviewed the appropriate phone numbers to call if they have any and questions or concerns.

## 2023-08-01 NOTE — Progress Notes (Signed)
Sent message, via epic in basket, requesting orders in epic from Careers adviser.

## 2023-08-04 ENCOUNTER — Encounter: Payer: Self-pay | Admitting: Neurology

## 2023-08-05 ENCOUNTER — Encounter (HOSPITAL_COMMUNITY)
Admission: RE | Admit: 2023-08-05 | Discharge: 2023-08-05 | Disposition: A | Discharge status: Home / Self Care | Payer: Medicare Other | Source: Ambulatory Visit | Attending: General Surgery

## 2023-08-05 ENCOUNTER — Encounter (HOSPITAL_COMMUNITY): Payer: Self-pay

## 2023-08-05 ENCOUNTER — Other Ambulatory Visit: Payer: Self-pay

## 2023-08-05 VITALS — BP 134/89 | HR 70 | Temp 98.4°F | Resp 18 | Ht 62.5 in | Wt 200.0 lb

## 2023-08-05 DIAGNOSIS — G4730 Sleep apnea, unspecified: Secondary | ICD-10-CM | POA: Insufficient documentation

## 2023-08-05 DIAGNOSIS — Z8673 Personal history of transient ischemic attack (TIA), and cerebral infarction without residual deficits: Secondary | ICD-10-CM | POA: Diagnosis not present

## 2023-08-05 DIAGNOSIS — K828 Other specified diseases of gallbladder: Secondary | ICD-10-CM | POA: Insufficient documentation

## 2023-08-05 DIAGNOSIS — Z85528 Personal history of other malignant neoplasm of kidney: Secondary | ICD-10-CM | POA: Insufficient documentation

## 2023-08-05 DIAGNOSIS — Z01812 Encounter for preprocedural laboratory examination: Secondary | ICD-10-CM | POA: Insufficient documentation

## 2023-08-05 DIAGNOSIS — R7303 Prediabetes: Secondary | ICD-10-CM | POA: Diagnosis not present

## 2023-08-05 DIAGNOSIS — I10 Essential (primary) hypertension: Secondary | ICD-10-CM | POA: Insufficient documentation

## 2023-08-05 LAB — BASIC METABOLIC PANEL
Anion gap: 8 (ref 5–15)
BUN: 18 mg/dL (ref 8–23)
CO2: 24 mmol/L (ref 22–32)
Calcium: 10 mg/dL (ref 8.9–10.3)
Chloride: 108 mmol/L (ref 98–111)
Creatinine, Ser: 0.75 mg/dL (ref 0.44–1.00)
GFR, Estimated: >60 mL/min (ref >60)
Glucose, Bld: 103 mg/dL — ABNORMAL HIGH (ref 70–99)
Potassium: 4.7 mmol/L (ref 3.5–5.1)
Sodium: 140 mmol/L (ref 135–145)

## 2023-08-05 LAB — CBC
HCT: 40.2 % (ref 36.0–46.0)
Hemoglobin: 12.9 g/dL (ref 12.0–15.0)
MCH: 31.3 pg (ref 26.0–34.0)
MCHC: 32.1 g/dL (ref 30.0–36.0)
MCV: 97.6 fL (ref 80.0–100.0)
Platelets: 194 10*3/uL (ref 150–400)
RBC: 4.12 MIL/uL (ref 3.87–5.11)
RDW: 12.4 % (ref 11.5–15.5)
WBC: 4.9 10*3/uL (ref 4.0–10.5)
nRBC: 0 % (ref 0.0–0.2)

## 2023-08-06 NOTE — Progress Notes (Signed)
Anesthesia Chart Review   Case: [id number] Date/Time: 08/08/23 0945   Procedure: LAPAROSCOPIC CHOLECYSTECTOMY WITH POSSIBLE IOC   Anesthesia type: General   Pre-op diagnosis: Biliary Dyskinesia   Location: [facility] ROOM 01 / [facility]   Surgeons: Moise Boring, MD       DISCUSSION:78 y.o. never smoker with h/o HTN, sleep apnea with CPAP, stroke, renal cell carcinoma, biliary dyskinesia scheduled for above procedure 08/08/2023 with Dr. Melody Haver.  Pt evaluated by cardiology for chest pain in 2017.   Stress test 2017 low risk study. Pt advised to follow up with cardiology prn.  VS: BP 134/89   Pulse 70   Temp 36.9 C (Oral)   Resp 18   Ht 5' 2.5" (1.588 m)   Wt 90.7 kg   SpO2 98%   BMI 36.00 kg/m   PROVIDERS: Myrlene Broker, MD is PCP   Charlton Haws, MD is Cardiologist  LABS: Labs reviewed: Acceptable for surgery. (all labs ordered are listed, but only abnormal results are displayed)  Labs Reviewed  BASIC METABOLIC PANEL - Abnormal; Notable for the following components:      Result Value   Glucose, Bld 103 (*)    All other components within normal limits  CBC     IMAGES:   EKG:   CV:  Past Medical History:  Diagnosis Date   Allergic rhinitis    Anxiety    Arthritis    left foot   Asthma    Back pain    Blood transfusion without reported diagnosis    Colon polyps    adenomatous   Constipation    Diverticulosis    Dysrhythmia 1999   Dr Eden Emms 2003   Elevated LFTs    Fatty liver disease, nonalcoholic    GERD (gastroesophageal reflux disease)    Headache(784.0)    migraines with vision changes but no pain   Heart murmur    Hypertension    IBS (irritable bowel syndrome)    Joint pain    Migraines    Muscle cramps    Neuromuscular disorder (HCC)    RLS   Obesity    Palpitations    Pneumonia 2007   Renal cell carcinoma (HCC) 2013   minimal partial nephrectomy   Restless leg syndrome    Sleep apnea    moderate; wears CPAP, uncertain  about setting   Stroke [facility])    TIA (transient ischemic attack) 09/11/2014   Ulcer    Urinary incontinence    Vitamin D deficiency     Past Surgical History:  Procedure Laterality Date   BREAST REDUCTION SURGERY  Jan 1991   ELBOW SURGERY Left    KIDNEY SURGERY Right October 2013    Tumor removed    LAPAROSCOPIC APPENDECTOMY N/A 06/08/2018   Procedure: APPENDECTOMY LAPAROSCOPIC;  Surgeon: Chevis Pretty III, MD;  Location: [facility];  Service: General;  Laterality: N/A;   TONSILLECTOMY     VAGINAL HYSTERECTOMY  1979   partial    MEDICATIONS:  aspirin EC 81 MG tablet   clidinium-chlordiazePOXIDE (LIBRAX) 5-2.5 MG capsule   clonazePAM (KLONOPIN) 0.5 MG tablet   Collagen-Vitamin C-Biotin (COLLAGEN PO)   conjugated estrogens (PREMARIN) vaginal cream   esomeprazole (NEXIUM) 40 MG capsule   famotidine (PEPCID) 20 MG tablet   linaclotide (LINZESS) 290 MCG CAPS capsule   metaxalone (SKELAXIN) 400 MG tablet   metFORMIN (GLUCOPHAGE) 500 MG tablet   Multiple Vitamins-Minerals (CENTRUM SILVER 50+WOMEN) TABS   Omega-3-6-9 CAPS   ondansetron (ZOFRAN-ODT)  4 MG disintegrating tablet   oxyCODONE (ROXICODONE) 5 MG immediate release tablet   Probiotic Product (PROBIOTIC-10 PO)   Rimegepant Sulfate (NURTEC) 75 MG TBDP   RSV vaccine recomb adjuvanted (AREXVY) 120 MCG/0.5ML injection   spironolactone (ALDACTONE) 50 MG tablet   TURMERIC PO   No current facility-administered medications for this encounter.    Jodell Cipro Ward, PA-C [facility] Testing [phone]

## 2023-08-07 NOTE — Anesthesia Preprocedure Evaluation (Addendum)
Anesthesia Evaluation  Patient identified by MRN, date of birth, ID band Patient awake    Reviewed: Allergy & Precautions, NPO status , Patient's Chart, lab work & pertinent test results  Airway Mallampati: IV  TM Distance: >3 FB Neck ROM: Full    Dental no notable dental hx. (+) Teeth Intact, Dental Advisory Given   Pulmonary asthma , sleep apnea and Continuous Positive Airway Pressure Ventilation    Pulmonary exam normal breath sounds clear to auscultation       Cardiovascular hypertension (1476/78 preop), Pt. on medications Normal cardiovascular exam Rhythm:Regular Rate:Normal     Neuro/Psych  Headaches PSYCHIATRIC DISORDERS Anxiety     TIA   GI/Hepatic Neg liver ROS,GERD  Controlled and Medicated,,  Endo/Other  Obesity BMI 36  Renal/GU Renal disease (RCC s/p partial nephrectomy 2013)  negative genitourinary   Musculoskeletal  (+) Arthritis , Osteoarthritis,    Abdominal  (+) + obese  Peds  Hematology negative hematology ROS (+) Hb 12.9, plt 194   Anesthesia Other Findings   Reproductive/Obstetrics negative OB ROS                             Anesthesia Physical Anesthesia Plan  ASA: 3  Anesthesia Plan: General   Post-op Pain Management: Tylenol PO (pre-op)*   Induction: Intravenous  PONV Risk Score and Plan: 3 and Ondansetron, Dexamethasone and Treatment may vary due to age or medical condition  Airway Management Planned: Oral ETT  Additional Equipment: None  Intra-op Plan:   Post-operative Plan: Extubation in OR  Informed Consent: I have reviewed the patients History and Physical, chart, labs and discussed the procedure including the risks, benefits and alternatives for the proposed anesthesia with the patient or authorized representative who has indicated his/her understanding and acceptance.     Dental advisory given  Plan Discussed with: CRNA  Anesthesia Plan  Comments: (Last airway note: Laryngoscope Size: 2 and Miller Grade View: Grade I Tube type: Oral Tube size: 7.0 mm Number of attempts: 1 Airway Equipment and Method: Stylet  )       Anesthesia Quick Evaluation

## 2023-08-08 ENCOUNTER — Ambulatory Visit (HOSPITAL_BASED_OUTPATIENT_CLINIC_OR_DEPARTMENT_OTHER): Payer: Medicare Other | Admitting: Anesthesiology

## 2023-08-08 ENCOUNTER — Ambulatory Visit (HOSPITAL_COMMUNITY)
Admission: RE | Admit: 2023-08-08 | Discharge: 2023-08-08 | Disposition: A | Discharge status: Home / Self Care | Payer: Medicare Other | Attending: General Surgery | Admitting: General Surgery

## 2023-08-08 ENCOUNTER — Encounter (HOSPITAL_COMMUNITY)
Admission: RE | Disposition: A | Discharge status: Home / Self Care | Payer: Self-pay | Source: Home / Self Care | Attending: General Surgery

## 2023-08-08 ENCOUNTER — Encounter (HOSPITAL_COMMUNITY): Payer: Self-pay | Admitting: General Surgery

## 2023-08-08 ENCOUNTER — Other Ambulatory Visit: Payer: Self-pay

## 2023-08-08 ENCOUNTER — Ambulatory Visit (HOSPITAL_COMMUNITY): Payer: Medicare Other | Admitting: Physician Assistant

## 2023-08-08 DIAGNOSIS — J45909 Unspecified asthma, uncomplicated: Secondary | ICD-10-CM

## 2023-08-08 DIAGNOSIS — K760 Fatty (change of) liver, not elsewhere classified: Secondary | ICD-10-CM | POA: Insufficient documentation

## 2023-08-08 DIAGNOSIS — Z85528 Personal history of other malignant neoplasm of kidney: Secondary | ICD-10-CM | POA: Insufficient documentation

## 2023-08-08 DIAGNOSIS — K828 Other specified diseases of gallbladder: Secondary | ICD-10-CM | POA: Diagnosis not present

## 2023-08-08 DIAGNOSIS — I10 Essential (primary) hypertension: Secondary | ICD-10-CM | POA: Insufficient documentation

## 2023-08-08 DIAGNOSIS — Z6836 Body mass index (BMI) 36.0-36.9, adult: Secondary | ICD-10-CM | POA: Diagnosis not present

## 2023-08-08 DIAGNOSIS — F419 Anxiety disorder, unspecified: Secondary | ICD-10-CM | POA: Insufficient documentation

## 2023-08-08 DIAGNOSIS — G4733 Obstructive sleep apnea (adult) (pediatric): Secondary | ICD-10-CM | POA: Diagnosis not present

## 2023-08-08 DIAGNOSIS — K219 Gastro-esophageal reflux disease without esophagitis: Secondary | ICD-10-CM | POA: Diagnosis not present

## 2023-08-08 DIAGNOSIS — K8010 Calculus of gallbladder with chronic cholecystitis without obstruction: Secondary | ICD-10-CM | POA: Insufficient documentation

## 2023-08-08 DIAGNOSIS — Z8673 Personal history of transient ischemic attack (TIA), and cerebral infarction without residual deficits: Secondary | ICD-10-CM | POA: Insufficient documentation

## 2023-08-08 DIAGNOSIS — E669 Obesity, unspecified: Secondary | ICD-10-CM | POA: Diagnosis not present

## 2023-08-08 DIAGNOSIS — Z905 Acquired absence of kidney: Secondary | ICD-10-CM | POA: Insufficient documentation

## 2023-08-08 HISTORY — PX: CHOLECYSTECTOMY: SHX55

## 2023-08-08 SURGERY — LAPAROSCOPIC CHOLECYSTECTOMY
Anesthesia: General

## 2023-08-08 MED ORDER — LIDOCAINE 2% (20 MG/ML) 5 ML SYRINGE
INTRAMUSCULAR | Status: DC | PRN
  Administered 2023-08-08: 60 mg via INTRAVENOUS

## 2023-08-08 MED ORDER — CHLORHEXIDINE GLUCONATE CLOTH 2 % EX PADS: 6.0000 | MEDICATED_PAD | Freq: Once | CUTANEOUS | Status: DC

## 2023-08-08 MED ORDER — LACTATED RINGERS IR SOLN
Status: DC | PRN
  Administered 2023-08-08 (×2): 1000 mL

## 2023-08-08 MED ORDER — BUPIVACAINE-EPINEPHRINE 0.25% -1:200000 IJ SOLN
INTRAMUSCULAR | Status: AC
  Filled 2023-08-08: qty 1

## 2023-08-08 MED ORDER — SODIUM CHLORIDE 0.9% FLUSH: 3.0000 mL | INTRAVENOUS | Status: DC | PRN

## 2023-08-08 MED ORDER — ACETAMINOPHEN 500 MG PO TABS
1000.0000 mg | ORAL_TABLET | ORAL | Status: AC
  Administered 2023-08-08: 1000 mg via ORAL
  Filled 2023-08-08: qty 2

## 2023-08-08 MED ORDER — ONDANSETRON HCL 4 MG/2ML IJ SOLN
INTRAMUSCULAR | Status: DC | PRN
  Administered 2023-08-08: 4 mg via INTRAVENOUS

## 2023-08-08 MED ORDER — CEFAZOLIN SODIUM-DEXTROSE 2-4 GM/100ML-% IV SOLN
2.0000 g | INTRAVENOUS | Status: AC
  Administered 2023-08-08: 2 g via INTRAVENOUS
  Filled 2023-08-08: qty 100

## 2023-08-08 MED ORDER — ORAL CARE MOUTH RINSE: 15.0000 mL | Freq: Once | OROMUCOSAL | Status: AC

## 2023-08-08 MED ORDER — HYDROMORPHONE HCL 1 MG/ML IJ SOLN
INTRAMUSCULAR | Status: AC
  Filled 2023-08-08: qty 1

## 2023-08-08 MED ORDER — IBUPROFEN 200 MG PO TABS: 600.0000 mg | ORAL_TABLET | Freq: Four times a day (QID) | ORAL | 0 refills | Status: AC

## 2023-08-08 MED ORDER — MIDAZOLAM HCL 2 MG/2ML IJ SOLN
INTRAMUSCULAR | Status: AC
  Filled 2023-08-08: qty 2

## 2023-08-08 MED ORDER — AMISULPRIDE (ANTIEMETIC) 5 MG/2ML IV SOLN
INTRAVENOUS | Status: AC
  Filled 2023-08-08: qty 4

## 2023-08-08 MED ORDER — CHLORHEXIDINE GLUCONATE 0.12 % MT SOLN
15.0000 mL | Freq: Once | OROMUCOSAL | Status: AC
  Administered 2023-08-08: 15 mL via OROMUCOSAL

## 2023-08-08 MED ORDER — SODIUM CHLORIDE 0.9 % IV SOLN: 250.0000 mL | INTRAVENOUS | Status: DC | PRN

## 2023-08-08 MED ORDER — PROPOFOL 10 MG/ML IV BOLUS
INTRAVENOUS | Status: AC
  Filled 2023-08-08: qty 20

## 2023-08-08 MED ORDER — LIDOCAINE HCL (PF) 2 % IJ SOLN
INTRAMUSCULAR | Status: AC
  Filled 2023-08-08: qty 5

## 2023-08-08 MED ORDER — PROPOFOL 10 MG/ML IV BOLUS
INTRAVENOUS | Status: DC | PRN
  Administered 2023-08-08: 130 mg via INTRAVENOUS

## 2023-08-08 MED ORDER — ONDANSETRON HCL 4 MG/2ML IJ SOLN
INTRAMUSCULAR | Status: AC
  Filled 2023-08-08: qty 2

## 2023-08-08 MED ORDER — FENTANYL CITRATE (PF) 100 MCG/2ML IJ SOLN
INTRAMUSCULAR | Status: DC | PRN
  Administered 2023-08-08 (×4): 50 ug via INTRAVENOUS

## 2023-08-08 MED ORDER — FENTANYL CITRATE (PF) 100 MCG/2ML IJ SOLN
INTRAMUSCULAR | Status: AC
  Filled 2023-08-08: qty 2

## 2023-08-08 MED ORDER — INDOCYANINE GREEN 25 MG IV SOLR
2.5000 mg | Freq: Once | INTRAVENOUS | Status: AC
  Administered 2023-08-08: 2.5 mg via INTRAVENOUS
  Filled 2023-08-08: qty 10

## 2023-08-08 MED ORDER — ACETAMINOPHEN 500 MG PO TABS: 1000.0000 mg | ORAL_TABLET | Freq: Once | ORAL | Status: DC

## 2023-08-08 MED ORDER — DEXAMETHASONE SODIUM PHOSPHATE 10 MG/ML IJ SOLN
INTRAMUSCULAR | Status: AC
  Filled 2023-08-08: qty 1

## 2023-08-08 MED ORDER — DEXAMETHASONE SODIUM PHOSPHATE 10 MG/ML IJ SOLN
INTRAMUSCULAR | Status: DC | PRN
  Administered 2023-08-08: 10 mg via INTRAVENOUS

## 2023-08-08 MED ORDER — SODIUM CHLORIDE 0.9% FLUSH
3.0000 mL | Freq: Two times a day (BID) | INTRAVENOUS | Status: DC
Start: 2023-08-08 — End: 2023-08-08

## 2023-08-08 MED ORDER — HYDROMORPHONE HCL 1 MG/ML IJ SOLN
0.2500 mg | INTRAMUSCULAR | Status: DC | PRN
  Administered 2023-08-08: 0.25 mg via INTRAVENOUS
  Administered 2023-08-08 (×2): 0.5 mg via INTRAVENOUS
  Administered 2023-08-08: 0.25 mg via INTRAVENOUS
  Administered 2023-08-08: 0.5 mg via INTRAVENOUS

## 2023-08-08 MED ORDER — ACETAMINOPHEN 325 MG PO TABS: 650.0000 mg | ORAL_TABLET | Freq: Four times a day (QID) | ORAL | 0 refills | Status: AC

## 2023-08-08 MED ORDER — ROCURONIUM BROMIDE 10 MG/ML (PF) SYRINGE
PREFILLED_SYRINGE | INTRAVENOUS | Status: DC | PRN
  Administered 2023-08-08: 80 mg via INTRAVENOUS

## 2023-08-08 MED ORDER — ACETAMINOPHEN 325 MG PO TABS: 650.0000 mg | ORAL_TABLET | ORAL | Status: DC | PRN

## 2023-08-08 MED ORDER — ROCURONIUM BROMIDE 10 MG/ML (PF) SYRINGE
PREFILLED_SYRINGE | INTRAVENOUS | Status: AC
  Filled 2023-08-08: qty 10

## 2023-08-08 MED ORDER — ACETAMINOPHEN 650 MG RE SUPP: 650.0000 mg | RECTAL | Status: DC | PRN

## 2023-08-08 MED ORDER — ONDANSETRON HCL 4 MG/2ML IJ SOLN: 4.0000 mg | Freq: Once | INTRAMUSCULAR | Status: DC | PRN

## 2023-08-08 MED ORDER — OXYCODONE HCL 5 MG/5ML PO SOLN: 5.0000 mg | Freq: Once | ORAL | Status: DC | PRN

## 2023-08-08 MED ORDER — OXYCODONE HCL 5 MG PO TABS: 5.0000 mg | ORAL_TABLET | Freq: Three times a day (TID) | ORAL | 0 refills | Status: AC | PRN

## 2023-08-08 MED ORDER — FENTANYL CITRATE PF 50 MCG/ML IJ SOSY
25.0000 ug | PREFILLED_SYRINGE | INTRAMUSCULAR | Status: DC | PRN
  Administered 2023-08-08 (×2): 25 ug via INTRAVENOUS

## 2023-08-08 MED ORDER — SUGAMMADEX SODIUM 200 MG/2ML IV SOLN
INTRAVENOUS | Status: DC | PRN
  Administered 2023-08-08: 200 mg via INTRAVENOUS

## 2023-08-08 MED ORDER — LACTATED RINGERS IV SOLN
INTRAVENOUS | Status: DC
Start: 2023-08-08 — End: 2023-08-08

## 2023-08-08 MED ORDER — SIMETHICONE 80 MG PO CHEW
80.0000 mg | CHEWABLE_TABLET | Freq: Once | ORAL | Status: AC
  Administered 2023-08-08: 80 mg via ORAL
  Filled 2023-08-08: qty 1

## 2023-08-08 MED ORDER — 0.9 % SODIUM CHLORIDE (POUR BTL) OPTIME
TOPICAL | Status: DC | PRN
  Administered 2023-08-08: 1000 mL

## 2023-08-08 MED ORDER — AMISULPRIDE (ANTIEMETIC) 5 MG/2ML IV SOLN
10.0000 mg | Freq: Once | INTRAVENOUS | Status: AC | PRN
  Administered 2023-08-08: 10 mg via INTRAVENOUS

## 2023-08-08 MED ORDER — BUPIVACAINE-EPINEPHRINE 0.25% -1:200000 IJ SOLN
INTRAMUSCULAR | Status: DC | PRN
  Administered 2023-08-08: 30 mL

## 2023-08-08 MED ORDER — FENTANYL CITRATE PF 50 MCG/ML IJ SOSY
PREFILLED_SYRINGE | INTRAMUSCULAR | Status: AC
  Filled 2023-08-08: qty 1

## 2023-08-08 MED ORDER — OXYCODONE HCL 5 MG PO TABS: 5.0000 mg | ORAL_TABLET | ORAL | Status: DC | PRN

## 2023-08-08 MED ORDER — OXYCODONE HCL 5 MG PO TABS: 5.0000 mg | ORAL_TABLET | Freq: Once | ORAL | Status: DC | PRN

## 2023-08-08 SURGICAL SUPPLY — 36 items
APPLIER CLIP 5 13 M/L LIGAMAX5 (MISCELLANEOUS)
APPLIER CLIP ROT 10 11.4 M/L (STAPLE)
CABLE HIGH FREQUENCY MONO STRZ (ELECTRODE) IMPLANT
CATH CHOLANG 76X19 KUMAR (CATHETERS) IMPLANT
CHLORAPREP W/TINT 26 (MISCELLANEOUS) ×2 IMPLANT
CLIP APPLIE 5 13 M/L LIGAMAX5 (MISCELLANEOUS) IMPLANT
CLIP APPLIE ROT 10 11.4 M/L (STAPLE) IMPLANT
COVER MAYO STAND XLG (MISCELLANEOUS) IMPLANT
COVER SURGICAL LIGHT HANDLE (MISCELLANEOUS) ×2 IMPLANT
DERMABOND ADVANCED .7 DNX12 (GAUZE/BANDAGES/DRESSINGS) ×2 IMPLANT
DRAPE C-ARM 42X120 X-RAY (DRAPES) IMPLANT
ELECT PENCIL ROCKER SW 15FT (MISCELLANEOUS) ×2 IMPLANT
ELECT REM PT RETURN 15FT ADLT (MISCELLANEOUS) ×2 IMPLANT
ENDOLOOP SUT PDS II 0 18 (SUTURE) ×2 IMPLANT
GLOVE BIO SURGEON STRL SZ7 (GLOVE) ×2 IMPLANT
GOWN STRL REUS W/ TWL XL LVL3 (GOWN DISPOSABLE) ×2 IMPLANT
GRASPER SUT TROCAR 14GX15 (MISCELLANEOUS) IMPLANT
HEMOSTAT SNOW SURGICEL 2X4 (HEMOSTASIS) IMPLANT
IRRIG SUCT STRYKERFLOW 2 WTIP (MISCELLANEOUS)
IRRIGATION SUCT STRKRFLW 2 WTP (MISCELLANEOUS) IMPLANT
KIT BASIN OR (CUSTOM PROCEDURE TRAY) ×2 IMPLANT
KIT TURNOVER KIT A (KITS) IMPLANT
L-HOOK LAP DISP 36CM (ELECTROSURGICAL) ×1
LHOOK LAP DISP 36CM (ELECTROSURGICAL) ×2 IMPLANT
NDL INSUFFLATION 14GA 120MM (NEEDLE) ×2 IMPLANT
NEEDLE INSUFFLATION 14GA 120MM (NEEDLE) ×1
POUCH RETRIEVAL ECOSAC 10 (ENDOMECHANICALS) ×2 IMPLANT
SCISSORS LAP 5X35 DISP (ENDOMECHANICALS) ×2 IMPLANT
SET TUBE SMOKE EVAC HIGH FLOW (TUBING) ×2 IMPLANT
SLEEVE Z-THREAD 5X100MM (TROCAR) ×4 IMPLANT
SPIKE FLUID TRANSFER (MISCELLANEOUS) ×2 IMPLANT
SUT MNCRL AB 4-0 PS2 18 (SUTURE) ×2 IMPLANT
TOWEL OR 17X26 10 PK STRL BLUE (TOWEL DISPOSABLE) ×2 IMPLANT
TRAY LAPAROSCOPIC (CUSTOM PROCEDURE TRAY) ×2 IMPLANT
TROCAR ADV FIXATION 12X100MM (TROCAR) ×2 IMPLANT
TROCAR Z-THREAD OPTICAL 5X100M (TROCAR) ×2 IMPLANT

## 2023-08-08 NOTE — Op Note (Signed)
Patient: Jamie [name] MRN: [id number] DOB: 11/12/1945 Sex: female Operation/Procedure Date: 08/08/2023 Surgeons and Role:    * [name], Lucilla Edin, MD - Primary  Pre-operative Diagnoses: Biliary Dyskinesia Postoperative Diagnoses: Biliary Dyskinesia, Fatty liver disease   Procedure performed: Procedures:   * LAPAROSCOPIC CHOLECYSTECTOMY Anesthesia: General endotracheal anesthesia  Indications: Jamie [name] is a 78 year old female who presented to clinic with complaints of abdominal pain with radiation to her back.  Imaging showed a possible polyp and she underwent a HIDA that confirmed dyskinesia. Following a discussion, she elected to proceed with cholecystectomy. Preoperatively, I discussed in detail the risks, benefits, alternatives, and potential complications. The patient understands and requests to proceed.  Operative Findings: Fatty liver disease.  Critical view obtained prior to placing clips.   Operative Narrative: The patient was positively identified and was taken to the operating room and placed supine on the operating table. A time-out was performed confirming correct patient and procedure. We also confirmed initiation of deep venous thrombosis prophylaxis and wound prophylaxis. After successful induction of general endotracheal anesthesia, the arms were carefully padded. An orogastric tube and footboard were placed. The abdomen was prepped and draped in the usual sterile surgical fashion.  We began our peritoneal access with a veress needle inserted at Palmer's point.  After aspiration showed return of air bubbles and there was a positive saline drop test, the insufflation was connected and the abdomen brought to a pressure of .  We then used an opti-view technique to place a 5mm port just to the right of midline, superior to the umbilicus.  A laparoscope was introduced into the abdomen, and there were no signs of injury from entry.  A 12mm port was placed in the  subxiphoid position.  Two additional 5mm ports were placed in the RUQ.  A 360-degree visualization with a 30-degree 5-mm laparoscope revealed grossly normal intra-abdominal contents.   The patient was placed in the head up position and tilted slightly to the left.  There was evidence of fatty liver disease and the liver itself was enlarged. When retracting the gallbladder cephalad, the lateral aspect of the liver developed a small crack. The bleeding was minimal and was controlled with electrocautery. The infundibulum was retracted laterally and inferiorly exposing Calot's triangle. The investing visceral peritoneal attachments overlying the infundibulum of the gallbladder were incised using the hook electrocautery and dissected free from the gallbladder itself. We soon developed two structures into the gallbladder consistent with the cystic duct and cystic artery. The loose areolar tissue around these structures was dissected free. The gallbladder was separated from the gallbladder fossa for approximately a third the distance up from the cystic plate, establishing the critical view of safety. We then transitioned to infrared viewing mode to allow for visualization of the ICG tracer. There was tracer throughout the liver. There was tracer within the candidate cystic duct as well as in a separate structure medially to the duct, consistent with where the common bile duct would be expected.  There was no tracer within the candidate cystic artery.  The duodenum was covered in fat and not able to be visualized. The cystic duct and cystic artery were triply clipped. These were divided using laparoscopic scissors leaving a single clip on the removal side. The gallbladder was elevated off the gallbladder fossa using the hook Bovie. The gallbladder was then exteriorized through the subxiphoid port site using an EndoCatch bag. We reestablished pneumoperitoneum and confirmed no leakage of blood or bile. We confirmed  integrity of our clips. The subhepatic space was irrigated with warm sterile saline and suctioned free. The 12mm port site was closed using an 0-vicryl on a suture passer.We placed 0.25% Marcaine with epinephrine at each incision site for local anesthesia. The skin was closed using 4-0 Monocryl subcuticular suture. Dermabond was applied. The patient tolerated the procedure well, was extubated, and taken to the recovery room.  Estimated Blood Loss: 50mL Specimens: Gallbladder Implants: None Drains: None Complications: None Condition of the patient: Good, extubated Disposition: PACU  Moise Boring Date: 08/08/2023 Time: 9:41 AM

## 2023-08-08 NOTE — H&P (Signed)
Jamie [name] [birth year]  [id number].    HPI:  78 y/o F w/ a hx of abdominal pain with biliary dyskinesia based on HIDA who presents for cholecystectomy. She is in her usual state of health and denies any recent changes in medication.   ROS: Review of Systems  Constitutional: Negative.   HENT: Negative.    Eyes: Negative.   Respiratory: Negative.    Cardiovascular: Negative.   Gastrointestinal:  Positive for abdominal pain.  Genitourinary: Negative.   Musculoskeletal: Negative.   Skin: Negative.   Neurological: Negative.   Endo/Heme/Allergies: Negative.   Psychiatric/Behavioral: Negative.      Family History  Problem Relation Age of Onset   Hypertension Mother    Anxiety disorder Mother    Heart disease Father 40   Lung cancer Father        smoker   Lung cancer Sister    Heart disease Brother    Allergies Brother    Allergies Daughter    Melanoma Daughter    Colon cancer Cousin    Hypertension Other    Liver disease Neg Hx    Asthma Neg Hx    Esophageal cancer Neg Hx    Rectal cancer Neg Hx    Stomach cancer Neg Hx    Liver cancer Neg Hx    Prostate cancer Neg Hx    Pancreatic cancer Neg Hx     Past Medical History:  Diagnosis Date   Allergic rhinitis    Anxiety    Arthritis    left foot   Asthma    Back pain    Blood transfusion without reported diagnosis    Colon polyps    adenomatous   Constipation    Diverticulosis    Dysrhythmia 1999   Dr Eden Emms 2003   Elevated LFTs    Fatty liver disease, nonalcoholic    GERD (gastroesophageal reflux disease)    Headache(784.0)    migraines with vision changes but no pain   Heart murmur    Hypertension    IBS (irritable bowel syndrome)    Joint pain    Migraines    Muscle cramps    Neuromuscular disorder (HCC)    RLS   Obesity    Palpitations    Pneumonia 2007   Renal cell carcinoma (HCC) 2013   minimal partial nephrectomy   Restless leg syndrome    Sleep apnea    moderate; wears CPAP,  uncertain about setting   Stroke [facility])    TIA (transient ischemic attack) 09/11/2014   Ulcer    Urinary incontinence    Vitamin D deficiency     Past Surgical History:  Procedure Laterality Date   BREAST REDUCTION SURGERY  Jan 1991   ELBOW SURGERY Left    KIDNEY SURGERY Right October 2013    Tumor removed    LAPAROSCOPIC APPENDECTOMY N/A 06/08/2018   Procedure: APPENDECTOMY LAPAROSCOPIC;  Surgeon: Griselda Miner, MD;  Location: [facility];  Service: General;  Laterality: N/A;   TONSILLECTOMY     VAGINAL HYSTERECTOMY  1979   partial    Social History:  reports that she has never smoked. She has never used smokeless tobacco. She reports current alcohol use of about 2.0 - 3.0 standard drinks of alcohol per week. She reports that she does not use drugs.  Allergies:  Allergies  Allergen Reactions   Buprenorphine Hcl Rash and Shortness Of Breath   Morphine And Codeine Shortness Of Breath and Rash  Morphine And Codeine Shortness Of Breath   Aspirin Other (See Comments)    REACTION: ulcers  Can take 81 mg   Avelox [Moxifloxacin Hcl In Nacl] Hives   Diflucan [Fluconazole] Hives    Whelps and rash c skin peeling   Diflucan [Fluconazole] Hives   Hydrochlorothiazide Other (See Comments)   Latex Rash   Moxifloxacin Palpitations    Tachycardia     Medications Prior to Admission  Medication Sig Dispense Refill   aspirin EC 81 MG tablet Take 1 tablet (81 mg total) by mouth daily. 90 tablet 3   clidinium-chlordiazePOXIDE (LIBRAX) 5-2.5 MG capsule Take 1 capsule by mouth 2 (two) times daily. 180 capsule 3   clonazePAM (KLONOPIN) 0.5 MG tablet Take 1 tablet (0.5 mg total) by mouth at bedtime. 90 tablet 1   Collagen-Vitamin C-Biotin (COLLAGEN PO) Take 1 tablet by mouth daily.     esomeprazole (NEXIUM) 40 MG capsule TAKE 1 CAPSULE TWICE A DAY BEFORE MEALS 180 capsule 3   famotidine (PEPCID) 20 MG tablet Take 1 tablet (20 mg total) by mouth 2 (two) times daily. 180 tablet 3   linaclotide  (LINZESS) 290 MCG CAPS capsule TAKE 1 CAPSULE DAILY BEFORE BREAKFAST 90 capsule 3   metaxalone (SKELAXIN) 400 MG tablet Take 1 tablet (400 mg total) by mouth at bedtime. 90 tablet 3   metFORMIN (GLUCOPHAGE) 500 MG tablet Take 1 tablet (500 mg total) by mouth daily with breakfast. 90 tablet 3   Multiple Vitamins-Minerals (CENTRUM SILVER 50+WOMEN) TABS Take 1 tablet by mouth daily.     Omega-3-6-9 CAPS Take 1 capsule by mouth every evening.     ondansetron (ZOFRAN-ODT) 4 MG disintegrating tablet Take 1 tablet (4 mg total) by mouth every 8 (eight) hours as needed for nausea or vomiting. 12 tablet 0   Probiotic Product (PROBIOTIC-10 PO) Take 1 capsule by mouth 2 (two) times daily.      spironolactone (ALDACTONE) 50 MG tablet Take 1 tablet (50 mg total) by mouth daily. 90 tablet 3   TURMERIC PO Take 1 tablet by mouth daily.     conjugated estrogens (PREMARIN) vaginal cream Place 1 Applicatorful vaginally every other day. (Patient not taking: Reported on 07/31/2023) 42.5 g 12   oxyCODONE (ROXICODONE) 5 MG immediate release tablet Take 1 tablet (5 mg total) by mouth every 6 (six) hours as needed for severe pain (pain score 7-10). (Patient not taking: Reported on 07/31/2023) 10 tablet 0   Rimegepant Sulfate (NURTEC) 75 MG TBDP Take 75 mg by mouth daily as needed (take for abortive therapy of migraine, no more than 1 tablet in 24 hours or 8 per month). 8 tablet 11   RSV vaccine recomb adjuvanted (AREXVY) 120 MCG/0.5ML injection Inject into the muscle. (Patient not taking: Reported on 07/31/2023) 1 each 0    Physical Exam: Blood pressure (!) 146/78, pulse 72, temperature 99.2 F (37.3 C), temperature source Oral, resp. rate 18, height 5' 2.5" (1.588 m), weight 90.7 kg, SpO2 96%. Gen: female, NAD Abd: soft, non-distended, non-tender  No results found for this or any previous visit (from the past 48 hours). No results found.  Assessment/Plan 78 y/o F w/ biliary dyskinesia   - Will proceed to the OR. We  discussed the alternatives and potential risks of surgery, including but not limited to: bleeding, infection, damage to bowel or surrounding structures, bile leak, damage to the biliary system, pancreatitis, retained CBD stone, and need for additional procedures. All questions were addressed and consent was obtained.  T[facility] 08/08/2023, 8:15 AM Please see Amion for pager number during day hours 7:00am-4:30pm or 7:00am -11:30am on weekends

## 2023-08-08 NOTE — Anesthesia Postprocedure Evaluation (Signed)
Anesthesia Post Note  Patient: Jamie [name]  Procedure(s) Performed: LAPAROSCOPIC CHOLECYSTECTOMY     Patient location during evaluation: PACU Anesthesia Type: General Level of consciousness: awake and alert, oriented and patient cooperative Pain management: pain level controlled Vital Signs Assessment: post-procedure vital signs reviewed and stable Respiratory status: spontaneous breathing, nonlabored ventilation and respiratory function stable Cardiovascular status: blood pressure returned to baseline and stable Postop Assessment: no apparent nausea or vomiting Anesthetic complications: no   No notable events documented.  Last Vitals:  Vitals:   08/08/23 0733 08/08/23 0951  BP: (!) 146/78 136/76  Pulse: 72 74  Resp: 18 12  Temp: 37.3 C   SpO2: 96% 95%    Last Pain:  Vitals:   08/08/23 0951  TempSrc:   PainSc: Asleep                 Lannie Fields

## 2023-08-08 NOTE — Transfer of Care (Signed)
Immediate Anesthesia Transfer of Care Note  Patient: Jamie [name]  Procedure(s) Performed: LAPAROSCOPIC CHOLECYSTECTOMY  Patient Location: PACU  Anesthesia Type:General  Level of Consciousness: sedated  Airway & Oxygen Therapy: Patient Spontanous Breathing and Patient connected to face mask oxygen  Post-op Assessment: Report given to RN and Post -op Vital signs reviewed and stable  Post vital signs: Reviewed and stable  Last Vitals:  Vitals Value Taken Time  BP    Temp    Pulse 74 08/08/23 0950  Resp 12 08/08/23 0950  SpO2 95 % 08/08/23 0950  Vitals shown include unfiled device data.  Last Pain:  Vitals:   08/08/23 0733  TempSrc: Oral  PainSc:       Patients Stated Pain Goal: 4 (08/08/23 0726)  Complications: No notable events documented.

## 2023-08-08 NOTE — Discharge Instructions (Signed)
 Outpatient Surgery Home Care Instruction  Activity  The effects of anesthesia are still present and drowsiness may result.  Limit activity for the first 24 hours, then you may return to normal daily activities. Returning to normal daily activities as soon as you can following surgery will enhance recovery time.  Do not drive or operate heavy machinery within 24 hours of taking narcotic pain medications.   Do not mow the lawn, use a vacuum cleaner, or do any other strenuous activities without first consulting your surgical team.   Diet Drink plenty of fluids and eat light meals today, then resume regular diet. Some patients may find their appetite is poor for a week or two after surgery. This is a normal result of the stress of surgery-your appetite will return in time.   There are no specific diet restrictions after surgery.   Dressing and Wound Care  Keep your wound or incision site clean and dry.  You may have different types of dressings covering your incisions depending on your operation and your surgeon: o Dermabond/Durabond (skin glue): This will usually remain in place for 10-14 days, then naturally fall off your skin. You may take a shower 24 hrs after surgery, carefully wash, not scrub the incision site with a mild non-scented soap. Pat dry with a soft towel.  Do not pick or peel skin glue off.  You can shower and let the water fall on the dressings above. Do not soak or submerge your incision(s) in a bath tub, hot tub, or swimming pool, until your doctor says it is ok to do so or the incision(s) have completely healed, usually about 2-4 weeks.  Do not use creams, powder, salves or balms on your incision(s).  What to Expect After Surgery   Moderate discomfort controlled with medications  Minimal drainage from incision  Feeling fatigue and weak  Constipation after surgery is common. Drink plenty fluids and eat a high fiber diet.   Pain Control: Prescribed Non-Narcotic Pain  Medication  You will be given three prescriptions.  Two of them will be for prescription strength ibuprofen (i.e. Advil) and prescription strength acetaminophen (i.e. Tylenol).  The vast majority of patients will just need these two medications.  One prescription will be for a 'rescue' prescription of an oral narcotic (oxycodone).  You may fill this if needed.  You will alternate taking the ibuprofen (600mg ) every 6 hours and also the acetaminophen (650mg ) every 6 hours so that you are taking one of those medications every 3 hours.  For example: o 0800 - take ibuprofen 600mg  o 1100 - take acetaminophen 650mg  o 1400 - take ibuprofen 600mg  o 1700 - take acetaminophen 650mg  o Etc.  Continue taking this alternating pattern of ibuprofen and acetaminophen for 3 days  If you cannot take one or the other of these medications, just take the one you can every 6 hours.  If you are comfortable at night, you don't have to wake up and take a medication.  If you are still uncomfortable after taking either ibuprofen or acetaminophen, try gentle stretching exercise and ice packs (a bag of frozen vegetables works great).  If you are still uncomfortable, you may fill the narcotic prescription of Oxycodone and take as directed.  Once you have completed these prescriptions, your pain level should be low enough to stop taking medications altogether or just use an over the counter medication (ibuprofen or acetaminophen) as needed.    Pain Control: Over the Counter Medications to take as  needed  Colace/Docusate: May be prescribed by your surgeon to prevent constipation caused by the combination of narcotics, effects of anesthesia, and decreased ambulation.  Hold for loose stools or diarrhea. Take 100 mg 1-2 times a day starting tonight.   Fiber: High fiber foods, extra liquids (water 9-13 cups/day) can also assist with constipation. Examples of high fiber foods are fruit, bran. Prune juice and water are also good liquids  to drink.  Milk of Magnesia/Miralax:  If constipated despite takeing the over the counter stool softeners, you may take Milk of Magnesia or Miralax as directed on bottle to assist with constipation.     Pepcid/Famotidine: May be prescribed while taking naproxen (Aleve) or other NSAIDs such as ibuprofen (Motrin/Advil) to prevent stomach upset or Acid-reflux symptoms. Take 1 tablet 1-2 times a day.   **Constipation: The first bowel movement may occur anywhere between 1-5 days after surgery.  As long as you are not nauseated or not having significant abdominal pain this variation is acceptable. Narcotic pain medications can cause constipation increasing discomfort; early discontinuation will assist with bowel management. If constipated despite taking stool softeners, you may take Milk of Magnesia or Miralax as directed on the bottle.     **Home medications: You may restart your home medications as directed by your respective Primary Care Physician or Surgeon.   When to notify your Doctor or Healthcare Team   Sign of Wound Infection   Fever over 100 degrees.  Wound becomes extremely swollen, shows red streaks, warm to the touch, and/or drainage from the incision site or foul-smelling drainage.  Wound edges separate or opens up  Bleeding or bruising   If you have bleeding, apply pressure to the site and hold the pressure firmly for 5 minutes. If the bleeding continues, apply pressure again and call 911. If the bleeding stopped, call your doctor to report it.   Call your doctor or nurse if you have increased bleeding from your site and increased bruising or a lump forms or gets larger under your skin at the site. Unrelieved Pain   Call your doctor or nurse if your pain gets worse or is not eased 1 hour after taking your pain medicine, or if it is severe and uncontrolled. Nausea and Vomiting   Call your doctor or nurse if you have nausea and vomiting that continues more than 24 hours, will not let you  keep medicine down and will not let you keep fluids down  Fever, Flu-like symptoms   Fever over 100 degrees and/or chills  Gastrointestinal Bleeding Symptoms    Black tarry bowel movements.  This can be normal after surgery on the stomach, but should resolve in a day or two.    Call 911 if you suddenly have signs of blood loss such as:  Vomiting blood  Fast heart rate  Feeling faint, sweaty, or blacking out  Passing bright red blood from your rectum  Blood Clot Symptoms   Tender, swollen or reddened areas in your calf muscle or thighs.  Numbness or tingling in your lower leg or calf, or at the top of your leg or groin  Skin on your leg looks pale or blue or feels cold to touch  Chest pain or have trouble breathing, lightheadedness, fast heart rate  Sudden Onset of Symptoms    Call 911 if you suddenly have:  Leg weakness and spasm  Loss of bladder or bowel function  Seizure  Confusion, severe headache, dizziness or feeling unsteady, problems talking, difficulty  swallowing, and/or numbness or muscle weakness as these could be signs of a stroke.  Follow up Appointment Your follow up appointment should be scheduled 2-3 weeks after your surgery date.  If you have not previously scheduled for a follow-up visit you can be scheduled by contacting [phone].

## 2023-08-08 NOTE — Anesthesia Procedure Notes (Signed)
Procedure Name: Intubation Date/Time: 08/08/2023 8:23 AM  Performed by: Doran Clay, CRNAPre-anesthesia Checklist: Patient identified, Emergency Drugs available, Suction available, Patient being monitored and Timeout performed Patient Re-evaluated:Patient Re-evaluated prior to induction Oxygen Delivery Method: Circle system utilized Preoxygenation: Pre-oxygenation with 100% oxygen Induction Type: IV induction and Cricoid Pressure applied Ventilation: Mask ventilation without difficulty Laryngoscope Size: Mac and 4 Grade View: Grade I Tube type: Oral Tube size: 7.5 mm Number of attempts: 1 Airway Equipment and Method: Stylet Placement Confirmation: ETT inserted through vocal cords under direct vision, positive ETCO2 and breath sounds checked- equal and bilateral Secured at: 22 cm Tube secured with: Tape Dental Injury: Teeth and Oropharynx as per pre-operative assessment

## 2023-08-09 ENCOUNTER — Encounter (HOSPITAL_COMMUNITY): Payer: Self-pay | Admitting: General Surgery

## 2023-08-11 LAB — SURGICAL PATHOLOGY

## 2023-08-20 ENCOUNTER — Encounter: Payer: Self-pay | Admitting: Internal Medicine

## 2023-08-20 DIAGNOSIS — Z9049 Acquired absence of other specified parts of digestive tract: Secondary | ICD-10-CM | POA: Diagnosis not present

## 2023-08-20 DIAGNOSIS — R1084 Generalized abdominal pain: Secondary | ICD-10-CM | POA: Diagnosis not present

## 2023-08-21 ENCOUNTER — Telehealth: Payer: Self-pay

## 2023-08-21 ENCOUNTER — Other Ambulatory Visit (HOSPITAL_COMMUNITY): Payer: Self-pay | Admitting: Student

## 2023-08-21 DIAGNOSIS — R1011 Right upper quadrant pain: Secondary | ICD-10-CM

## 2023-08-21 NOTE — Telephone Encounter (Signed)
Jamie [name] "[name]"  [facility] Clinical Pool Phone Number: [phone]   Dr Marina Goodell, It will probably take 3-4 messages to contain my lengthy comments. Please bear with me.  I understand doctors try to stay in their own lanes, however, I need advice from someone I trust. Dr [name] of [facility] removed my gallbladder on January 31 at [facility] as an outpatient. Naturally, I was not fully aware of discharge instructions. Dr [name] talked to Jamie [name] on his cell phone, and I was given several pages of written instructions. I was instructed to take Tylenol every six hours alternating with Motrin every six hours with a three hour interval between these drugs along with an Oxycodone (12 tablets total) every eight hours. After finishing the Oxycodone on the fourth day I would rate my pain level an eight.I continued with the Tylenol and Motrin regimen hoping I would ultimately win some pain relief. My stomach started to become quite sensitive. The only foods I have been able to handle without feeling nausea have been plain toast, chicken soup, vanilla ice-cream, and a banana every couple of days. The area around the main incision is still tender and I have a good deal of pain in my back on the right side above the kidney. 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 There have been no further problems with it.) I do not believe my physical history was studied thoroughly before this surgery. Our oldest daughter who worked ten years as a Geneticist, molecular was with me when I had the kidney surgery.  She realized shortly after I was moved to a room I was having a reaction to the Oxycodone. I suppose it was never noted in my chart that Oxy "shreds" my stomach and makes me nauseous. I remember very little of the first 48 hours after this surgery. Thursday, Feb 6, I called [facility] and talked with a nurse. I told her the OTC meds were upsetting my stomach and not handing the  pain. She insisted I continue the every three hour regimen and added another prescription for Oxy every four hours. The next day, Friday, Feb 7, I felt all over terrible (never any fever) with continued pain, especially with any movement. Thankfully, I keep a decorative bowl on the table beside my chair because around 9pm I filled it. I felt my system was poisoned and stopped taking everything except my daily meds. At present I am taking only one Tylenol 650 with milk before bed for my arthritis. The nausea is fading, but my appetite is for more bland foods. The pain is constant and greater when I move, it's a level  I can handle, but it limits sleep. I sleep with a CPAP and found the OXY would ease the pain long enough for me to doze off, but it would stop my breathing and wake me. Our daughter told me today that I should have asked for Hydrocodone because it is digested differently than Oxy and would not have messed up my stomach even though it isn't as fast acting. I talked to a PA at [facility] yesterday and was sent to [facility] for blood work today. Test results are not available as I write this.   Just my opinion: I am 78 years old. People my age do not heal or bounce back as quickly as we once did. Removing and organ and expecting me to come out of anesthesia,  get dressed, ride home, get undressed, into bed and remember to follow all the discharge instructions is an unreasonable expectation, the help of our loved ones not withstanding. A night in the hospital would provide extra time to wake and become more aware and see if medications are tolerated. Just because the surgical group has a standard protocol for post surgical pain management doesn't mean it's right for everyone. No, it has not been painless for me to sit here and write this, but I am very concerned about my present health condition. I'm sure [facility] is trying to do the right thing, but right now I need a second opinion from someone I trust completely.  Please let me know what you think.   Thanks, Jamie [name]

## 2023-08-22 ENCOUNTER — Ambulatory Visit (HOSPITAL_COMMUNITY)
Admission: RE | Admit: 2023-08-22 | Discharge: 2023-08-22 | Disposition: A | Discharge status: Home / Self Care | Payer: Medicare Other | Source: Ambulatory Visit | Attending: Student | Admitting: Student

## 2023-08-22 DIAGNOSIS — K760 Fatty (change of) liver, not elsewhere classified: Secondary | ICD-10-CM | POA: Diagnosis not present

## 2023-08-22 DIAGNOSIS — R1011 Right upper quadrant pain: Secondary | ICD-10-CM | POA: Diagnosis not present

## 2023-08-22 MED ORDER — IOHEXOL 350 MG/ML SOLN
100.0000 mL | Freq: Once | INTRAVENOUS | Status: AC | PRN
  Administered 2023-08-22: 100 mL via INTRAVENOUS

## 2023-09-25 ENCOUNTER — Ambulatory Visit (INDEPENDENT_AMBULATORY_CARE_PROVIDER_SITE_OTHER)

## 2023-09-25 ENCOUNTER — Other Ambulatory Visit: Payer: Self-pay

## 2023-09-25 ENCOUNTER — Ambulatory Visit (INDEPENDENT_AMBULATORY_CARE_PROVIDER_SITE_OTHER): Admitting: Family Medicine

## 2023-09-25 ENCOUNTER — Encounter: Payer: Self-pay | Admitting: Family Medicine

## 2023-09-25 VITALS — BP 120/76 | HR 69 | Ht 62.5 in

## 2023-09-25 DIAGNOSIS — G8929 Other chronic pain: Secondary | ICD-10-CM

## 2023-09-25 DIAGNOSIS — M19012 Primary osteoarthritis, left shoulder: Secondary | ICD-10-CM | POA: Diagnosis not present

## 2023-09-25 DIAGNOSIS — M778 Other enthesopathies, not elsewhere classified: Secondary | ICD-10-CM | POA: Diagnosis not present

## 2023-09-25 DIAGNOSIS — M25512 Pain in left shoulder: Secondary | ICD-10-CM

## 2023-09-25 NOTE — Progress Notes (Signed)
   I, Stevenson Clinch, CMA acting as a scribe for Jamie Graham, MD.  Jamie [name] is a 78 y.o. female who presents to [facility] today for L shoulder pain. Pt was previously seen by Dr. Denyse Amass in 2023 for L knee and L hip pain.  Today, pt c/o L shoulder pain x 1 month, changing slightly in location. Now having more pain at the inner arm. Also notes n/t in the hand at night. Pt locates pain to the upper arm. Describes pain at aching with limited ROM. Sx worse with movement.   She had gallbladder surgery about 2 months ago.  She had a lot of shoulder pain after the surgery and a lot of abdominal wall pain after the surgery.  Radiates: upper arm Aggravates: movement Treatments tried: none  Pertinent review of systems: No fevers or chills  Relevant historical information: Hypertension.  NASH.   Exam:  BP 120/76   Pulse 69   Ht 5' 2.5" (1.588 m)   SpO2 97%   BMI 36.00 kg/m  General: Well Developed, well nourished, and in no acute distress.   MSK: Left shoulder normal-appearing decreased range of motion pain with abduction.  Pain located in the axilla area and shoulder. Strength decreased abduction 4/5 external and internal rotation strength is intact. Positive Hawkins and Neer's test.  Positive empty can test. No masses palpated in the axilla.  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   Lab and Radiology Results  X-ray images left shoulder obtained today personally and independently interpreted. No acute fractures.  Minimal glenohumeral DJD.  Mild AC DJD. Await formal radiology review    Assessment and Plan: 78 y.o. female with left shoulder pain chronic.  This is ongoing following gallbladder surgery about 2 months ago.  She may have had odd shoulder positioning during surgery that caused pain or the abdominal pain after surgery may have been exacerbating her shoulder dysfunction and pain.  Plan for physical therapy and x-ray.   Recheck in 6 weeks.  Return sooner if needed.   PDMP not reviewed this encounter. Orders Placed This Encounter  Procedures   DG Shoulder Left    Standing Status:   Future    Number of Occurrences:   1    Expiration Date:   10/26/2023    Reason for Exam (SYMPTOM  OR DIAGNOSIS REQUIRED):   left shoulder pain    Preferred imaging location?:   [facility]   Ambulatory referral to Physical Therapy    Referral Priority:   Routine    Referral Type:   Physical Medicine    Referral Reason:   Specialty Services Required    Requested Specialty:   Physical Therapy    Number of Visits Requested:   1   No orders of the defined types were placed in this encounter.    Discussed warning signs or symptoms. Please see discharge instructions. Patient expresses understanding.   The above documentation has been reviewed and is accurate and complete Jamie [name], M.D.

## 2023-09-25 NOTE — Patient Instructions (Addendum)
 Thank you for coming in today.   Please get an Xray today before you leave   I've referred you to Physical Therapy.  Let us know if you don't hear from them in one week.   Check back in 6 weeks

## 2023-10-03 ENCOUNTER — Ambulatory Visit: Admitting: Physician Assistant

## 2023-10-06 ENCOUNTER — Other Ambulatory Visit: Payer: Self-pay | Admitting: Internal Medicine

## 2023-10-08 ENCOUNTER — Ambulatory Visit: Attending: Family Medicine | Admitting: Physical Therapy

## 2023-10-08 ENCOUNTER — Encounter: Payer: Self-pay | Admitting: Physical Therapy

## 2023-10-08 ENCOUNTER — Other Ambulatory Visit: Payer: Self-pay

## 2023-10-08 DIAGNOSIS — M6281 Muscle weakness (generalized): Secondary | ICD-10-CM | POA: Diagnosis not present

## 2023-10-08 DIAGNOSIS — G8929 Other chronic pain: Secondary | ICD-10-CM

## 2023-10-08 DIAGNOSIS — R293 Abnormal posture: Secondary | ICD-10-CM | POA: Diagnosis not present

## 2023-10-08 DIAGNOSIS — M25512 Pain in left shoulder: Secondary | ICD-10-CM | POA: Insufficient documentation

## 2023-10-08 NOTE — Therapy (Signed)
 OUTPATIENT PHYSICAL THERAPY SHOULDER EVALUATION   Patient Name: Jamie [name] MRN: [id number] DOB:[birth year], 78 y.o., female Today's Date: 10/08/2023  END OF SESSION:  PT End of Session - 10/08/23 1304     Visit Number 1    Number of Visits 7    Date for PT Re-Evaluation 11/19/23    Authorization Type MCR    Authorization Time Period 10/08/23 to 11/19/23    PT Start Time 1302    PT Stop Time 1341    PT Time Calculation (min) 39 min    Activity Tolerance Patient tolerated treatment well    Behavior During Therapy WFL for tasks assessed/performed             Past Medical History:  Diagnosis Date   Allergic rhinitis    Anxiety    Arthritis    left foot   Asthma    Back pain    Blood transfusion without reported diagnosis    Colon polyps    adenomatous   Constipation    Diverticulosis    Dysrhythmia 1999   Dr Eden Emms 2003   Elevated LFTs    Fatty liver disease, nonalcoholic    GERD (gastroesophageal reflux disease)    Headache(784.0)    migraines with vision changes but no pain   Heart murmur    Hypertension    IBS (irritable bowel syndrome)    Joint pain    Migraines    Muscle cramps    Neuromuscular disorder (HCC)    RLS   Obesity    Palpitations    Pneumonia 2007   Renal cell carcinoma (HCC) 2013   minimal partial nephrectomy   Restless leg syndrome    Sleep apnea    moderate; wears CPAP, uncertain about setting   Stroke [facility])    TIA (transient ischemic attack) 09/11/2014   Ulcer    Urinary incontinence    Vitamin D deficiency    Past Surgical History:  Procedure Laterality Date   BREAST REDUCTION SURGERY  Jan 1991   CHOLECYSTECTOMY N/A 08/08/2023   Procedure: LAPAROSCOPIC CHOLECYSTECTOMY;  Surgeon: Moise Boring, MD;  Location: [facility];  Service: General;  Laterality: N/A;   ELBOW SURGERY Left    KIDNEY SURGERY Right October 2013    Tumor removed    LAPAROSCOPIC APPENDECTOMY N/A 06/08/2018   Procedure: APPENDECTOMY LAPAROSCOPIC;   Surgeon: Griselda Miner, MD;  Location: [facility];  Service: General;  Laterality: N/A;   TONSILLECTOMY     VAGINAL HYSTERECTOMY  1979   partial   Patient Active Problem List   Diagnosis Date Noted   Neuropathy 10/02/2021   Insomnia 09/21/2021   Pain in left hip 09/07/2020   Bilateral leg pain 08/10/2020   Vitamin D deficiency 08/25/2017   Pre-diabetes 08/25/2017   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   TIA (transient ischemic attack) 12/19/2016   Common migraine 12/19/2016   Obstructive sleep apnea on CPAP 12/19/2016   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) 11/21/2016   Morbid obesity (HCC) 09/16/2014   Kidney mass 03/10/2012   Rosacea 05/07/2011   Shoulder pain, right 05/07/2011   Tinnitus 08/09/2010   Fatigue 01/16/2009   Allergic rhinitis 04/28/2008   Restrictive lung disease 04/28/2008   RESTLESS LEG SYNDROME 02/02/2007   Essential hypertension 02/02/2007   MITRAL VALVE PROLAPSE 02/02/2007   GERD 02/02/2007   Irritable bowel syndrome 02/02/2007   BREAST CYST 02/02/2007    PCP: Hillard Danker MD   REFERRING PROVIDER: Denyse Amass,  Michel Harrow, MD  REFERRING DIAG: [phone] (ICD-10-CM) - Chronic left shoulder pain  THERAPY DIAG:  Chronic left shoulder pain  Muscle weakness (generalized)  Abnormal posture  Rationale for Evaluation and Treatment: Rehabilitation  ONSET DATE: Late January 2025  SUBJECTIVE:                                                                                                                                                                                      SUBJECTIVE STATEMENT:  I had surgery for my gallbladder late January, I think the way they strapped this arm down has hindered my motion. Had some tricep pain but that's going away. I'm most concerned with the mobility of my arm. Thursday night I was taking some laundry up the steps (probably carrying too much) and also  carrying a glass of water in my other hand, the dog bumped into me and made me fall up 3-4 steps and I landed on my R arm/wrist and got some rug burn and bruising. I must have done something to my right hip, when I move my right hip hurts now. Have hand some L hand numbness 3-4 times a night when sleeping. I think that the shoulder is just sore from positioning during the surgery, I'm pacifying Dr. Denyse Amass by coming here.     Hand dominance: Right  PERTINENT HISTORY: See above   PAIN:  Are you having pain?  "Usual aches and pains, I'm 78 years old"   PRECAUTIONS: None  RED FLAGS: None   WEIGHT BEARING RESTRICTIONS: No  FALLS:  Has patient fallen in last 6 months? Yes. 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  DIAGNOSTIC FINDINGS:  X-ray images left shoulder obtained today personally and independently interpreted. No acute fractures.  Minimal glenohumeral DJD.  Mild AC DJD. Await formal radiology review  PATIENT SURVEYS:   Patient-Specific Activity Scoring Scheme  "0" represents "unable to perform." "10" represents "able to perform at prior level. 0 1 2 3 4 5 6 7 8 9  10 (Date and Score)   Activity Eval     1. Personal hygiene/dressing    7  2. Holding things for any length of time  5     3.     4.    5.    Score 6    Total score = sum of the activity scores/number of activities Minimum detectable change (90%CI) for average score = 2  points Minimum detectable change (90%CI) for single activity score = 3 points       COGNITION: Overall cognitive status: Within functional limits for tasks assessed     SENSATION: Not tested  POSTURE: Rounded shoulder, forward head, increased thoracic kyphosis   UPPER EXTREMITY ROM:   Active ROM Right eval Left eval  Shoulder flexion 130* 115*  Shoulder extension    Shoulder abduction 175* 120*  Shoulder adduction    Shoulder internal rotation About T8 About T8  Shoulder external rotation T4 T4  Elbow flexion    Elbow extension    Wrist flexion    Wrist extension    Wrist ulnar deviation    Wrist radial deviation    Wrist pronation    Wrist supination    (Blank rows = not tested)  UPPER EXTREMITY MMT:  MMT Right eval Left eval  Shoulder flexion 4+ 4  Shoulder extension    Shoulder abduction 4+ 4- pain anterior shoulder   Shoulder adduction    Shoulder internal rotation 4+ 4+  Shoulder external rotation 4+ 4+  Middle trapezius    Lower trapezius    Elbow flexion    Elbow extension    Wrist flexion    Wrist extension    Wrist ulnar deviation    Wrist radial deviation    Wrist pronation    Wrist supination    Grip strength (lbs)    (Blank rows = not tested)  PALPATION  Unable to trigger any pain with palpation L triceps or L deltoids, upper trap                                                                                                                              TREATMENT DATE:   10/08/23  Eval, POC, HEP   Demonstration/practice of all HEP exercises as below    PATIENT EDUCATION: Education details: exam findings, POC, HEP  Person educated: Patient Education method: Programmer, multimedia, Demonstration, and Handouts Education comprehension: verbalized understanding, returned demonstration, and needs further education  HOME EXERCISE PROGRAM:  Access Code: [email or web address] URL: [email or web address] Date: 10/08/2023 Prepared by:  Nedra Hai  Exercises - Standing Shoulder Flexion Stretch on Wall  - 1 x daily - 7 x weekly - 1-2 sets - 10 reps - Seated Shoulder Abduction Towel Slide at Table Top  - 1 x daily - 7 x weekly - 1-2 sets - 10 reps - Scapular Retraction with Resistance  - 1 x daily - 7 x weekly - 1-2 sets - 10 reps - Shoulder extension with resistance - Neutral  - 1 x daily - 7 x weekly - 1-2 sets - 10 reps  ASSESSMENT:  CLINICAL IMPRESSION: Patient is a 78 y.o. F who was seen today for physical therapy evaluation and treatment for M25.512,G89.29 (ICD-10-CM) - Chronic left shoulder pain. Exam with findings as above. She is agreeable to trying a short course of PT and possibly extending if it is helpful.   OBJECTIVE IMPAIRMENTS: decreased ROM, decreased strength, increased fascial restrictions, increased muscle spasms, impaired UE functional use, postural dysfunction, obesity, and pain.   ACTIVITY LIMITATIONS: carrying, lifting, bathing, toileting, dressing, reach over head, and caring for others  PARTICIPATION LIMITATIONS: meal prep, cleaning, laundry, driving, shopping, and community activity  PERSONAL FACTORS: Age, Behavior pattern, Fitness, Past/current experiences, and Time since onset of injury/illness/exacerbation are also affecting patient's functional outcome.   REHAB POTENTIAL: Fair sedentary lifestyle, limited buy in to PT   CLINICAL DECISION MAKING: Stable/uncomplicated  EVALUATION COMPLEXITY: Low   GOALS: Goals reviewed with patient? No  SHORT TERM GOALS: Target date: 10/29/2023    Will be compliant with appropriate progressive HEP  Baseline: Goal status: INITIAL  2.  L shoulder AROM to be equal to that of the right  Baseline:  Goal status: INITIAL  3.  MMT to have improved by one grade all weak groups  Baseline:  Goal status: INITIAL  4.  Will be able to perform all personal care and dressing/hygiene based tasks without difficulty  Baseline:  Goal status: INITIAL  5.   PSFS to have improved by 2 points  Baseline:  Goal status: INITIAL    LONG TERM GOALS: Target date: LTGs = STGs at this time       PLAN:  PT FREQUENCY: 2x/week  PT DURATION: 3 weeks  PLANNED INTERVENTIONS: 97164- PT Re-evaluation, 97110-Therapeutic exercises, 97530- Therapeutic activity, [id number]- Neuromuscular re-education, 97535- Self Care, 16109- Manual therapy, G0283- Electrical stimulation (unattended), 97016- Vasopneumatic device, 97033- Ionotophoresis 4mg /ml Dexamethasone, Taping, Dry Needling, Cryotherapy, and Moist heat  PLAN FOR NEXT SESSION: PT trial- shoulder ROM and strength, postural retraining/strengthening; if she wants to continue after 3 weeks trial/is making good progress, could extend POC otherwise DC   Nedra Hai, PT, DPT 10/08/23 1:50 PM

## 2023-10-09 ENCOUNTER — Encounter: Payer: Self-pay | Admitting: Family Medicine

## 2023-10-09 NOTE — Progress Notes (Signed)
 Left shoulder x-ray shows arthritis but no fractures.

## 2023-10-13 ENCOUNTER — Telehealth: Payer: Self-pay | Admitting: Internal Medicine

## 2023-10-13 MED ORDER — CILIDINIUM-CHLORDIAZEPOXIDE 2.5-5 MG PO CAPS: 1.0000 | ORAL_CAPSULE | Freq: Two times a day (BID) | ORAL | 0 refills | Status: DC

## 2023-10-13 NOTE — Telephone Encounter (Signed)
 Librax refilled

## 2023-10-13 NOTE — Telephone Encounter (Signed)
 Inbound call from patient would like a month supply of librax sent to [facility] on [facility]. States her mail order pharmacy is out of stock and she does not have any more medication.

## 2023-10-15 ENCOUNTER — Ambulatory Visit (INDEPENDENT_AMBULATORY_CARE_PROVIDER_SITE_OTHER): Admitting: Sports Medicine

## 2023-10-15 ENCOUNTER — Ambulatory Visit: Admitting: Physical Therapy

## 2023-10-15 ENCOUNTER — Ambulatory Visit (INDEPENDENT_AMBULATORY_CARE_PROVIDER_SITE_OTHER)

## 2023-10-15 VITALS — HR 74 | Ht 62.0 in | Wt 202.0 lb

## 2023-10-15 DIAGNOSIS — R0781 Pleurodynia: Secondary | ICD-10-CM | POA: Diagnosis not present

## 2023-10-15 MED ORDER — MELOXICAM 15 MG PO TABS: 15.0000 mg | ORAL_TABLET | Freq: Every day | ORAL | 0 refills | Status: DC

## 2023-10-15 NOTE — Progress Notes (Signed)
 Jamie [name] D.[facility] [address] [place] [address] Phone: [phone]   Assessment and Plan:     1. Rib pain on right side -Acute, initial visit - Right sided rib pain along ribs 6-8 lateral angle after falling forward upstairs last week.  Consistent with contusion of ribs - X-ray obtained in clinic.  My interpretation: No displaced acute fracture or pneumothorax - Start meloxicam 15 mg daily x2 weeks.  If still having pain after 2 weeks, complete 3rd-week of NSAID. May use remaining NSAID as needed once daily for pain control.  Do not to use additional over-the-counter NSAIDs (ibuprofen, naproxen, Advil, Aleve, etc.) while taking prescription NSAIDs.  May use Tylenol [phone] mg 2 to 3 times a day for breakthrough pain. - Recommend heating pads over area of pain   Pertinent previous records reviewed include none  Follow Up: 4 weeks for reevaluation.  Could discuss physical therapy versus other areas of pain   Subjective:   I, Jamie [name], am serving as a Neurosurgeon for Doctor Richardean Sale  Chief Complaint: rib pain   HPI:   10/15/23 Patient is a 78 year old female with rib pain. Patient states she had a fall a week ago last Tuesday. States she fell up the stairs. Pain taking deep breathes. Pain when sleeping isn't able to sleep through the night. Tylenol for the pain and that helps some. She has constant pain    Relevant Historical Information: Hypertension, GERD, NASH, IBS, prediabetes, morbid obesity  Additional pertinent review of systems negative.   Current Outpatient Medications:    meloxicam (MOBIC) 15 MG tablet, Take 1 tablet (15 mg total) by mouth daily., Disp: 30 tablet, Rfl: 0   aspirin EC 81 MG tablet, Take 1 tablet (81 mg total) by mouth daily., Disp: 90 tablet, Rfl: 3   clidinium-chlordiazePOXIDE (LIBRAX) 5-2.5 MG capsule, Take 1 capsule by mouth 2 (two) times daily. Office visit for further refills, Disp: 60  capsule, Rfl: 0   clonazePAM (KLONOPIN) 0.5 MG tablet, Take 1 tablet (0.5 mg total) by mouth at bedtime., Disp: 90 tablet, Rfl: 1   Collagen-Vitamin C-Biotin (COLLAGEN PO), Take 1 tablet by mouth daily., Disp: , Rfl:    conjugated estrogens (PREMARIN) vaginal cream, Place 1 Applicatorful vaginally every other day., Disp: 42.5 g, Rfl: 12   esomeprazole (NEXIUM) 40 MG capsule, TAKE 1 CAPSULE TWICE A DAY BEFORE MEALS, Disp: 180 capsule, Rfl: 3   famotidine (PEPCID) 20 MG tablet, Take 1 tablet (20 mg total) by mouth 2 (two) times daily., Disp: 180 tablet, Rfl: 3   linaclotide (LINZESS) 290 MCG CAPS capsule, TAKE 1 CAPSULE DAILY BEFORE BREAKFAST, Disp: 90 capsule, Rfl: 3   metaxalone (SKELAXIN) 400 MG tablet, Take 1 tablet (400 mg total) by mouth at bedtime., Disp: 90 tablet, Rfl: 3   metFORMIN (GLUCOPHAGE) 500 MG tablet, Take 1 tablet (500 mg total) by mouth daily with breakfast., Disp: 90 tablet, Rfl: 3   Multiple Vitamins-Minerals (CENTRUM SILVER 50+WOMEN) TABS, Take 1 tablet by mouth daily., Disp: , Rfl:    Omega-3-6-9 CAPS, Take 1 capsule by mouth every evening., Disp: , Rfl:    ondansetron (ZOFRAN-ODT) 4 MG disintegrating tablet, Take 1 tablet (4 mg total) by mouth every 8 (eight) hours as needed for nausea or vomiting., Disp: 12 tablet, Rfl: 0   Probiotic Product (PROBIOTIC-10 PO), Take 1 capsule by mouth 2 (two) times daily. , Disp: , Rfl:    Rimegepant Sulfate (NURTEC) 75 MG  TBDP, Take 75 mg by mouth daily as needed (take for abortive therapy of migraine, no more than 1 tablet in 24 hours or 8 per month)., Disp: 8 tablet, Rfl: 11   RSV vaccine recomb adjuvanted (AREXVY) 120 MCG/0.5ML injection, Inject into the muscle., Disp: 1 each, Rfl: 0   spironolactone (ALDACTONE) 50 MG tablet, Take 1 tablet (50 mg total) by mouth daily., Disp: 90 tablet, Rfl: 3   TURMERIC PO, Take 1 tablet by mouth daily., Disp: , Rfl:    Objective:     Vitals:   10/15/23 1349  Pulse: 74  SpO2: 98%  Weight: 202 lb  (91.6 kg)  Height: 5\' 2"  (1.575 m)      Body mass index is 36.95 kg/m.    Physical Exam:    General: Well-appearing, cooperative, sitting comfortably in no acute distress.  HEENT: Normocephalic, atraumatic.   Neck: No gross abnormality.  Cardiovascular: No pallor or cyanosis. Resp: Comfortable WOB.  Right sided rib pain with deep inhalation.  No flail chest.  TTP right lateral ribs, maximal tenderness ribs 6-8 Abdomen: Non distended.   Skin: Warm and dry; no focal rashes identified on limited exam. Extremities: No cyanosis or edema.  Neuro: Gross motor and sensory intact. Gait normal. Psychiatric: Mood and affect are appropriate.    Electronically signed by:  Jamie [name] D.[facility] 2:14 PM 10/15/23

## 2023-10-15 NOTE — Patient Instructions (Signed)
-   Start meloxicam 15 mg daily x2 weeks.  If still having pain after 2 weeks, complete 3rd-week of NSAID. May use remaining NSAID as needed once daily for pain control.  Do not to use additional over-the-counter NSAIDs (ibuprofen, naproxen, Advil, Aleve, etc.) while taking prescription NSAIDs.  May use Tylenol [phone] mg 2 to 3 times a day for breakthrough pain . Warm compression and heating pad over area of pain   4 week follow up

## 2023-10-20 ENCOUNTER — Ambulatory Visit: Admitting: Physical Therapy

## 2023-10-22 ENCOUNTER — Ambulatory Visit: Admitting: Physical Therapy

## 2023-10-27 ENCOUNTER — Encounter: Payer: Self-pay | Admitting: Sports Medicine

## 2023-10-27 ENCOUNTER — Telehealth: Payer: Self-pay

## 2023-10-27 ENCOUNTER — Ambulatory Visit: Payer: Medicare Other | Admitting: Internal Medicine

## 2023-10-27 MED ORDER — LINACLOTIDE 290 MCG PO CAPS: ORAL_CAPSULE | ORAL | 3 refills | Status: DC

## 2023-10-27 MED ORDER — ESOMEPRAZOLE MAGNESIUM 40 MG PO CPDR: DELAYED_RELEASE_CAPSULE | ORAL | 3 refills | Status: DC

## 2023-10-27 MED ORDER — FAMOTIDINE 20 MG PO TABS: 20.0000 mg | ORAL_TABLET | Freq: Two times a day (BID) | ORAL | 3 refills | Status: DC

## 2023-10-27 NOTE — Telephone Encounter (Signed)
 Spoke with patient to let her know Dr. Elvin Hammer would not be here for she and her husband's appts this afternoon.  (Has to do an ERCP) Per Dr. Elvin Hammer,   I refilled her medication and will call him to schedule a 6 month follow up.

## 2023-10-28 ENCOUNTER — Ambulatory Visit: Admitting: Physical Therapy

## 2023-10-29 ENCOUNTER — Encounter: Payer: Self-pay | Admitting: Neurology

## 2023-10-29 ENCOUNTER — Ambulatory Visit (INDEPENDENT_AMBULATORY_CARE_PROVIDER_SITE_OTHER): Payer: Medicare Other | Admitting: Neurology

## 2023-10-29 VITALS — BP 135/89 | HR 78 | Ht 62.5 in | Wt 203.0 lb

## 2023-10-29 DIAGNOSIS — C641 Malignant neoplasm of right kidney, except renal pelvis: Secondary | ICD-10-CM

## 2023-10-29 DIAGNOSIS — G4733 Obstructive sleep apnea (adult) (pediatric): Secondary | ICD-10-CM

## 2023-10-29 DIAGNOSIS — Z9989 Dependence on other enabling machines and devices: Secondary | ICD-10-CM | POA: Diagnosis not present

## 2023-10-29 DIAGNOSIS — G2581 Restless legs syndrome: Secondary | ICD-10-CM

## 2023-10-29 MED ORDER — CLONAZEPAM 0.5 MG PO TABS: 0.5000 mg | ORAL_TABLET | Freq: Every day | ORAL | 1 refills | Status: DC

## 2023-10-29 NOTE — Progress Notes (Signed)
 Provider:  Neomia Banner, MD  Primary Care Physician:  Adelia Homestead, MD [address] [place] [address]     Referring Provider: Adelia Homestead, Md [address] [place],  [place] [address]          Chief Complaint according to patient   Patient presents with:                HISTORY OF PRESENT ILLNESS:  Jamie [name] is a 78 y.o. female patient who is here for revisit 10/29/2023 for a new CPAP machine order;  Patient uses 12 cm water  pressure on a set pressure CPAP is a ResMed  S 9 Escape. She had once been given a newer CPAP refurbished which was loud and not working well. She is  now needing a new one.  [facility] ordered through Dr. Brent Cambric  ( Epworth 18/ 24 points !) and provided through [facility].  Now served by [employer]. I have not ever tested her for sleep apnea before. She needs a new machine and I will order a baseline through MEDICARE. Aaron Aas     Chief concern according to patient : " I had abdominal surgery , button hole , but she also reports a "cut" -Had gallbladder removed 08-2023 by Dr. Davonna Estes,  questions if she has abdominal hernia.   This female 78 year-old  patient is now referred for a problem of RLS, was for year treated by PCP with skelaxin  and klonopin . She reports her PCP refuses to reorder these medications and she has not found any other effective medication for this problem, staying awake due to RLS . She is established as an apnea patient in the [facility] sleep clinic and followed yearly with OSA on CPAP.  Patient presents today for RLS and  irresistible urge to move in both legs- rarely having bilateral leg pain. She said that she woke with cramps in her legs in December. Left leg pain comes form spinal stenosis, has had epidural injections which allowed temporary relief.  She had a difficult time getting her cramps to resolve. She said that both legs were very sore afterwards. Since the cramps, she  states that her legs ache. She has a history of restless leg syndrome. No lower back or groin pain.   Has been diagnosed with restless leg syndrome in the past by her primary care physician and treated on Klonopin  and Skelaxin  which did provide some help and allows her to go to sleep-.  She is not had any injury or trauma.  Does not really have any numbness or tingling just creepy crawly.  Has no history of diabetes.  Does not have fibromyalgia.  Not on a statin drug.  Severe RLS, exacerbated after recent medication changes: she was weaned off klonopin  and started on mirapex , reportedly can't tolerate Mirapex .   Has not been sleeping well due to prolonged sleep latency, severe quality of life impairment.    I will try to get her off Benadryl , gabapentin, Mirapex  , Lunesta and refill skelaxin - and I would order a low dose benzodiazepine. Klonopin  0.5 mg for the next 30 days at night with times 3 refills- . Total of 90 days. She has not ever used baclofen either-  She failed all other categories of RLS medications and I don't want her on narcotics.  I ordered Hba1c and TSH.       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  Compliance was reviewed: Average time of use is over 9 hours, and her residual AHI is 2.6/h , and set at 11 cm water .      Takes benadryl  , which can exacerbate  RLS.   She recently has been seeing a new PCP Dr. Nicolette Barrio who changed medications_ Cannot tolerate trazodone  and reports RLS worse on Mirapex .- both were  ordered by Dr .Nicolette Barrio 09-18-2021. Induced twitching.    Certainly not an easy case.  Check iron level and HBaic.   High calcium- parathyroid .     HISTORY: (copied from [employer] note on 01/19/2018) 78 y.o. year old female  has a past medical history of Allergic rhinitis, Anxiety, Arthritis, Asthma, Back pain, Blood transfusion without reported diagnosis, Colon polyps,  Constipation, Diverticulosis, Dysrhythmia (1999), Elevated LFTs, Fatty liver disease, nonalcoholic, GERD (gastroesophageal reflux disease), Headache(784.0), Heart murmur, Hypertension, IBS (irritable bowel syndrome), Joint pain, Migraines, Muscle cramps, Neuromuscular disorder (HCC), Obesity, Palpitations, Pneumonia (2007), Renal cell carcinoma (HCC) (2013), Restless leg syndrome, Sleep apnea, TIA (transient ischemic attack) (09/11/2014), Ulcer, Urinary incontinence, and Vitamin D  deficiency.    (CD)Is and is here today to be evaluated for a headache, and she reports that she first had migrainous headaches when she resumed birth control pills over 40 years ago. The headaches were very debilitating at the time but there were infrequent through the 1970s. Once she discontinued hormonal contraception her headaches became as infrequent as once a year. In the early 1980s the couple lived in [place] and Mrs. Voong recalls that the bright light was a trigger for headaches -there was a strong photophobic component, and she made many trips to the local hospital for Demerol  shots. She underwent a self hypnosis treatment and this worked well, controlled the migraine for years. On 9-20 02-2013 the patient woke up with a severe explosive kind of pain that seemed to arise from the right eye. The pain was centered retro-orbital and radiated backwards into the center of the head. The patient tried aspirin  and Motrin  with no benefit at the time as my colleague Dr. Anell Keep D.O. did document.  This was different from her typical migraines. She was extremely nauseated but she could not vomit and developed dry heaves. The headache broker out of sound sleep at about about 3 AM. She had no tearing of the eye ( that she could remember),  the nose was not runny and her face was not flushe.She was asleep with her CPAP.   The patient had an eye exam in 2013 which was reportedly normal at that time and her headaches were evaluated with a  brain MRI which was also reviewed and found unremarkable. Not had a number of the severe headache spell since. On 09-12-14 the patient again developed a headache this time of a new quality. She was watching TV at about 11 PM she noticed that the right vision was fluctuating she states that her vision was disturbed by changing in light and dark sensation, that she describedas if it checker board was placed in front of her eyes. By 11.45 , she tried to answer an e mail, couldn't decipher the message, non-sensical to her. She switched and tried to read aloud, she couldn't , her speech output was not correlated to the text she read. Jibberish, while her normal speech was unaffected. On the left side this time by migraines have always attack the  right side of her head. This was unusual as well. The entire episode lasted less than one hour. The emergency room obtained a CT head , non contrast, read as normal and reviewed here in imaging file. Her BP was very high.   She sleeps with a CPAP, and likes it very much, she had nocturia before being diagnosed with OSA.   Her pneumonia related hospitalization in 2007 was the cause for the test by [facility], as her RN had witnessed her sleep apnea. Dr .Villa Greaser follows her, Dr Donalee Fruits is her ENT.     Review of Systems: Out of a complete 14 system review, the patient complains of only the following symptoms, and all other reviewed systems are negative.:   How likely are you to doze in the following situations: 0 = not likely, 1 = slight chance, 2 = moderate chance, 3 = high chance  Sitting and Reading? 1-2 Watching Television?  1 Sitting inactive in a public place (theater or meeting)? 0 Lying down in the afternoon when circumstances permit?1 Sitting and talking to someone?0 Sitting quietly after lunch without alcohol?1 In a car, while stopped for a few minutes in traffic?0 As a passenger in a car for an hour without a break? 2  Total = 6-7 points.  On CPAP   FSS score.  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   Retired Research officer, political party.   Education- college   Patient is right handed.   Patient drinks tea a lot and water .   Social Drivers of Corporate investment banker Strain: Low Risk  (05/05/2023)   Overall Financial Resource Strain (CARDIA)    Difficulty of Paying Living Expenses: Not hard at all  Food Insecurity: No Food Insecurity (05/05/2023)   Hunger Vital Sign    Worried About Running Out of Food in the Last Year: Never true    Ran Out of Food in the Last Year: Never true  Transportation Needs: No Transportation Needs (05/05/2023)   PRAPARE - Administrator, Civil Service (Medical): No    Lack of Transportation (Non-Medical): No  Physical Activity: Insufficiently Active (05/05/2023)   Exercise Vital Sign    Days of Exercise per Week: 1 day    Minutes of Exercise per Session: 20 min  Stress: No Stress Concern Present (05/05/2023)   [employer] of Occupational Health - Occupational Stress Questionnaire    Feeling of Stress : Not at all  Social Connections: Socially Integrated (05/05/2023)   Social Connection and Isolation Panel [NHANES]  Frequency of  Communication with Friends and Family: More than three times a week    Frequency of Social Gatherings with Friends and Family: Three times a week    Attends Religious Services: 1 to 4 times per year    Active Member of Clubs or Organizations: Yes    Attends Engineer, structural: More than 4 times per year    Marital Status: Married    Family History  Problem Relation Age of Onset   Hypertension Mother    Anxiety disorder Mother    Heart disease Father 78   Lung cancer Father        smoker   Lung cancer Sister    Heart disease Brother    Allergies Brother    Allergies Daughter    Melanoma Daughter    Colon cancer Cousin    Hypertension Other    Liver disease Neg Hx    Asthma Neg Hx    Esophageal cancer Neg Hx    Rectal cancer Neg Hx    Stomach cancer Neg Hx    Liver cancer Neg Hx    Prostate cancer Neg Hx    Pancreatic cancer Neg Hx     Past Medical History:  Diagnosis Date   Allergic rhinitis    Anxiety    Arthritis    left foot   Asthma    Back pain    Blood transfusion without reported diagnosis    Colon polyps    adenomatous   Constipation    Diverticulosis    Dysrhythmia 1999   Dr Stann Earnest 2003   Elevated LFTs    Fatty liver disease, nonalcoholic    GERD (gastroesophageal reflux disease)    Headache(784.0)    migraines with vision changes but no pain   Heart murmur    Hypertension    IBS (irritable bowel syndrome)    Joint pain    Migraines    Muscle cramps    Neuromuscular disorder (HCC)    RLS   Obesity    Palpitations    Pneumonia 2007   Renal cell carcinoma (HCC) 2013   minimal partial nephrectomy   Restless leg syndrome    Sleep apnea    moderate; wears CPAP, uncertain about setting   Stroke [facility])    TIA (transient ischemic attack) 09/11/2014   Ulcer    Urinary incontinence    Vitamin D  deficiency     Past Surgical History:  Procedure Laterality Date   BREAST REDUCTION SURGERY  Jan 1991   CHOLECYSTECTOMY N/A 08/08/2023    Procedure: LAPAROSCOPIC CHOLECYSTECTOMY;  Surgeon: Cannon Champion, MD;  Location: [facility];  Service: General;  Laterality: N/A;   ELBOW SURGERY Left    KIDNEY SURGERY Right October 2013    Tumor removed    LAPAROSCOPIC APPENDECTOMY N/A 06/08/2018   Procedure: APPENDECTOMY LAPAROSCOPIC;  Surgeon: Lillette Reid III, MD;  Location: [facility];  Service: General;  Laterality: N/A;   TONSILLECTOMY     VAGINAL HYSTERECTOMY  1979   partial     Current Outpatient Medications on File Prior to Visit  Medication Sig Dispense Refill   aspirin  EC 81 MG tablet Take 1 tablet (81 mg total) by mouth daily. 90 tablet 3   clidinium-chlordiazePOXIDE  (LIBRAX ) 5-2.5 MG capsule Take 1 capsule by mouth 2 (two) times daily. Office visit for further refills 60 capsule 0   clonazePAM  (KLONOPIN ) 0.5 MG tablet Take 1 tablet (0.5 mg total) by mouth at bedtime. 90 tablet 1  Collagen-Vitamin C-Biotin (COLLAGEN PO) Take 1 tablet by mouth daily.     esomeprazole  (NEXIUM ) 40 MG capsule TAKE 1 CAPSULE TWICE A DAY BEFORE MEALS 180 capsule 3   famotidine  (PEPCID ) 20 MG tablet Take 1 tablet (20 mg total) by mouth 2 (two) times daily. 180 tablet 3   linaclotide  (LINZESS ) 290 MCG CAPS capsule TAKE 1 CAPSULE DAILY BEFORE BREAKFAST 90 capsule 3   meloxicam  (MOBIC ) 15 MG tablet Take 1 tablet (15 mg total) by mouth daily. 30 tablet 0   metaxalone  (SKELAXIN ) 400 MG tablet Take 1 tablet (400 mg total) by mouth at bedtime. 90 tablet 3   metFORMIN  (GLUCOPHAGE ) 500 MG tablet Take 1 tablet (500 mg total) by mouth daily with breakfast. 90 tablet 3   Multiple Vitamins-Minerals (CENTRUM SILVER 50+WOMEN) TABS Take 1 tablet by mouth daily.     Omega-3-6-9 CAPS Take 1 capsule by mouth every evening.     Probiotic Product (PROBIOTIC-10 PO) Take 1 capsule by mouth 2 (two) times daily.      Rimegepant Sulfate (NURTEC) 75 MG TBDP Take 75 mg by mouth daily as needed (take for abortive therapy of migraine, no more than 1 tablet in 24 hours or 8 per month).  8 tablet 11   RSV vaccine recomb adjuvanted (AREXVY ) 120 MCG/0.5ML injection Inject into the muscle. 1 each 0   spironolactone  (ALDACTONE ) 50 MG tablet Take 1 tablet (50 mg total) by mouth daily. 90 tablet 3   TURMERIC PO Take 1 tablet by mouth daily.     No current facility-administered medications on file prior to visit.    Allergies  Allergen Reactions   Buprenorphine Hcl Rash and Shortness Of Breath   Morphine  And Codeine  Shortness Of Breath and Rash   Morphine  And Codeine  Shortness Of Breath   Aspirin  Other (See Comments)    REACTION: ulcers  Can take 81 mg   Avelox [Moxifloxacin Hcl In Nacl] Hives   Diflucan [Fluconazole] Hives    Whelps and rash c skin peeling   Diflucan [Fluconazole] Hives   Hydrochlorothiazide Other (See Comments)   Latex Rash   Moxifloxacin Palpitations    Tachycardia      DIAGNOSTIC DATA (LABS, IMAGING, TESTING) - I reviewed patient records, labs, notes, testing and imaging myself where available.  Lab Results  Component Value Date   WBC 4.9 08/05/2023   HGB 12.9 08/05/2023   HCT 40.2 08/05/2023   MCV 97.6 08/05/2023   PLT 194 08/05/2023      Component Value Date/Time   NA 140 08/05/2023 1351   NA 142 10/18/2019 1228   K 4.7 08/05/2023 1351   CL 108 08/05/2023 1351   CO2 24 08/05/2023 1351   GLUCOSE 103 (H) 08/05/2023 1351   BUN 18 08/05/2023 1351   BUN 19 10/18/2019 1228   CREATININE 0.75 08/05/2023 1351   CALCIUM 10.0 08/05/2023 1351   PROT 7.6 06/26/2023 1514   PROT 7.5 10/18/2019 1228   ALBUMIN 4.5 06/26/2023 1514   ALBUMIN 5.0 (H) 10/18/2019 1228   AST 48 (H) 06/26/2023 1514   ALT 66 (H) 06/26/2023 1514   ALKPHOS 60 06/26/2023 1514   BILITOT 0.7 06/26/2023 1514   BILITOT 0.3 10/18/2019 1228   GFRNONAA >60 08/05/2023 1351   GFRAA 70 10/18/2019 1228   Lab Results  Component Value Date   CHOL 186 05/05/2023   HDL 74.80 05/05/2023   LDLCALC 85 05/05/2023   TRIG 132.0 05/05/2023   CHOLHDL 2 05/05/2023   Lab Results  Component Value Date   HGBA1C 5.3 05/05/2023   Lab Results  Component Value Date   VITAMINB12 669 04/30/2023   Lab Results  Component Value Date   TSH 1.35 05/05/2023    PHYSICAL EXAM:  Today's Vitals   10/29/23 1453  BP: 135/89  Pulse: 78  Weight: 203 lb (92.1 kg)  Height: 5' 2.5" (1.588 m)   Body mass index is 36.54 kg/m.   Wt Readings from Last 3 Encounters:  10/29/23 203 lb (92.1 kg)  10/15/23 202 lb (91.6 kg)  08/08/23 200 lb (90.7 kg)     Ht Readings from Last 3 Encounters:  10/29/23 5' 2.5" (1.588 m)  10/15/23 5\' 2"  (1.575 m)  09/25/23 5' 2.5" (1.588 m)      General: The patient is awake, alert and appears not in acute distress. The patient is well groomed. Head: Normocephalic, atraumatic. Neck is supple. Body mass index is 35.48 kg/m.     Neck size:  15. 75"   <Mallampati ; 3 plus.  Lymph node swelling left neck.  Generalized: Well developed, in no acute distress .  Cardiology: normal rate and rhythm, no murmur noted Respiratory: deferred.  Neurological examination  Mentation:  Alert oriented to time, place, history taking. Follows all commands speech and language fluent Cranial nerve : no loss of taste and smell-   Pupils were equal in size, round reactive to light. Extraocular movements were full, visual field were full  Motor: symmetric tone and mass, no cogwheeling.   Gait and station:  wider based gait, slightly stooped. intact, normal arm swing, normal romberg-  gets up from seated position unassisted.    Deep tendon reflexes: in the  upper and lower extremities are symmetrically attenuated  and intact.  Babinski response was deferred.   ASSESSMENT AND PLAN 78 y.o. year old female  here with:    1) Clonazepam   for RLS , take prn - but takes it every night - RLS / myoclonus.   2) CPAP for OSA  APRIA- , needs a baseline , ordered HST,   3) OSA  risk factor is obesity,  she snores loudly and chokes when sleeping without CPAP.     I  plan to follow up  through our NP within 4 months.   I would like to thank Adelia Homestead, MD and Adelia Homestead, Md [address] [place],  [place] [address] for allowing me to meet with and to take care of this pleasant patient.     After spending a total time of  35  minutes face to face and additional time for physical and neurologic examination, review of laboratory studies,  personal review of imaging studies, reports and results of other testing and review of referral information / records as far as provided in visit,   Electronically signed by: Neomia Banner, MD 10/29/2023 2:55 PM  [facility] and [facility] Sleep Board certified by The [employer] of Sleep Medicine and Diplomate of the [employer] of Sleep Medicine. Board certified In Neurology through the ABPN, Fellow of the [employer] of Neurology.

## 2023-10-29 NOTE — Patient Instructions (Signed)
 Sleep Apnea Test: What to Expect  Sleep apnea is a condition that affects your breathing while you're sleeping. You may have shallow breathing or stop breathing for short periods of time. Sleep apnea screening is a test to check if you're at risk for sleep apnea. The test includes questions. It will only takes a few minutes. Your health care provider may ask you to have this test before a surgery or as part of a physical exam. What are the symptoms of sleep apnea? Snoring. Waking up often at night. Daytime sleepiness. Pauses in breathing. Choking or gasping during sleep. Being annoyed easily. Forgetfulness. Trouble thinking clearly. Depression. Personality changes. Headaches in the morning. Most people with sleep apnea do not know that they have it. What are the advantages of sleep apnea screening? Getting screened for sleep apnea can help: Keep you safer. Your providers need to know whether or not you have sleep apnea, especially if you're having surgery or have other long-term, or chronic, health conditions. Improve your health and help you get a better night's rest. Restful sleep can help you: Have more energy. Lose weight. Improve high blood pressure. Improve diabetes management. Prevent stroke. Prevent car accidents. What happens before the screening? You may talk with your provider about the screening and what other tests may be recommended based on the screening. What happens during the screening? Screening usually includes being asked a list of questions about your sleep quality. Some questions you may be asked include: Do you snore? Is your sleep restless? Do you have daytime sleepiness? Has a partner or spouse told you that you stop breathing, choke, or gasp during sleep? Have you had trouble concentrating or memory loss? What is your age? What is your neck circumference? To measure your neck, keep your back straight and gently wrap the tape measure around your neck.  Put the tape measure at the middle of your neck, between your chin and collarbone. What is your sex assigned at birth? Do you have high blood pressure or are you being treated for high blood pressure? If your screening test is positive, you're at risk for the condition. More tests may be needed to confirm a diagnosis of sleep apnea. What can I expect after the screening? Your provider will go over the results of the screening with you and make recommendations based on the results of the test. Where to find more information You can find screening tools online or at your health care clinic. To learn more, go to these websites: Centers for Disease Control and Prevention: DiningCalendar.de. Then: Click Health Topics A-Z. Type "sleep apnea" in the search box. National Heart, Lung, and Blood Institute: [employer]DryCleaner.gl Contact a health care provider if: You think that you may have sleep apnea. This information is not intended to replace advice given to you by your health care provider. Make sure you discuss any questions you have with your health care provider. Document Revised: 11/30/2022 Document Reviewed: 11/30/2022 Elsevier Patient Education  2024 [employer].

## 2023-10-30 ENCOUNTER — Ambulatory Visit: Admitting: Physical Therapy

## 2023-11-05 DIAGNOSIS — Z1231 Encounter for screening mammogram for malignant neoplasm of breast: Secondary | ICD-10-CM | POA: Diagnosis not present

## 2023-11-05 LAB — HM MAMMOGRAPHY

## 2023-11-06 ENCOUNTER — Ambulatory Visit: Admitting: Family Medicine

## 2023-11-07 ENCOUNTER — Encounter: Payer: Self-pay | Admitting: Internal Medicine

## 2023-11-11 ENCOUNTER — Ambulatory Visit: Admitting: Neurology

## 2023-11-11 DIAGNOSIS — Z9989 Dependence on other enabling machines and devices: Secondary | ICD-10-CM

## 2023-11-11 DIAGNOSIS — G2581 Restless legs syndrome: Secondary | ICD-10-CM

## 2023-11-11 DIAGNOSIS — G4733 Obstructive sleep apnea (adult) (pediatric): Secondary | ICD-10-CM

## 2023-11-11 NOTE — Progress Notes (Deleted)
    [name] D.[facility] [address] [place] [address] Phone: [phone]   Assessment and Plan:     There are no diagnoses linked to this encounter.  ***   Pertinent previous records reviewed include ***    Follow Up: ***     Subjective:   I, Jamie [name], am serving as a Neurosurgeon for Doctor Jamie [name]   Chief Complaint: rib pain    HPI:    10/15/23 Patient is a 78 year old female with rib pain. Patient states she had a fall a week ago last Tuesday. States she fell up the stairs. Pain taking deep breathes. Pain when sleeping isn't able to sleep through the night. Tylenol  for the pain and that helps some. She has constant pain    11/12/2023 Patient states   Relevant Historical Information: Hypertension, GERD, NASH, IBS, prediabetes, morbid obesity    Additional pertinent review of systems negative.   Current Outpatient Medications:    aspirin  EC 81 MG tablet, Take 1 tablet (81 mg total) by mouth daily., Disp: 90 tablet, Rfl: 3   clonazePAM  (KLONOPIN ) 0.5 MG tablet, Take 1 tablet (0.5 mg total) by mouth at bedtime., Disp: 90 tablet, Rfl: 1   Collagen-Vitamin C-Biotin (COLLAGEN PO), Take 1 tablet by mouth daily., Disp: , Rfl:    esomeprazole  (NEXIUM ) 40 MG capsule, TAKE 1 CAPSULE TWICE A DAY BEFORE MEALS, Disp: 180 capsule, Rfl: 3   famotidine  (PEPCID ) 20 MG tablet, Take 1 tablet (20 mg total) by mouth 2 (two) times daily., Disp: 180 tablet, Rfl: 3   linaclotide  (LINZESS ) 290 MCG CAPS capsule, TAKE 1 CAPSULE DAILY BEFORE BREAKFAST, Disp: 90 capsule, Rfl: 3   meloxicam  (MOBIC ) 15 MG tablet, Take 1 tablet (15 mg total) by mouth daily., Disp: 30 tablet, Rfl: 0   metaxalone  (SKELAXIN ) 400 MG tablet, Take 1 tablet (400 mg total) by mouth at bedtime., Disp: 90 tablet, Rfl: 3   metFORMIN  (GLUCOPHAGE ) 500 MG tablet, Take 1 tablet (500 mg total) by mouth daily with breakfast., Disp: 90 tablet, Rfl: 3   Multiple Vitamins-Minerals  (CENTRUM SILVER 50+WOMEN) TABS, Take 1 tablet by mouth daily., Disp: , Rfl:    Omega-3-6-9 CAPS, Take 1 capsule by mouth every evening., Disp: , Rfl:    Probiotic Product (PROBIOTIC-10 PO), Take 1 capsule by mouth 2 (two) times daily. , Disp: , Rfl:    Rimegepant Sulfate (NURTEC) 75 MG TBDP, Take 75 mg by mouth daily as needed (take for abortive therapy of migraine, no more than 1 tablet in 24 hours or 8 per month)., Disp: 8 tablet, Rfl: 11   RSV vaccine recomb adjuvanted (AREXVY ) 120 MCG/0.5ML injection, Inject into the muscle., Disp: 1 each, Rfl: 0   spironolactone  (ALDACTONE ) 50 MG tablet, Take 1 tablet (50 mg total) by mouth daily., Disp: 90 tablet, Rfl: 3   TURMERIC PO, Take 1 tablet by mouth daily., Disp: , Rfl:    Objective:     There were no vitals filed for this visit.    There is no height or weight on file to calculate BMI.    Physical Exam:    ***   Electronically signed by:  Marshall Skeeter D.[facility] 7:43 AM 11/11/23

## 2023-11-12 ENCOUNTER — Ambulatory Visit: Admitting: Sports Medicine

## 2023-11-12 NOTE — Progress Notes (Signed)
 [facility]  Jamie [name] "[name]" 78 year old female [birth year]   HOME SLEEP TEST REPORT ( by Watch PAT)   STUDY DATE:  11-11-2023,     ORDERING CLINICIAN:  Neomia Banner, MD  REFERRING CLINICIAN:    CLINICAL INFORMATION/HISTORY: Jamie [name] is a 78 y.o. female patient who is here for revisit 10/29/2023 for a new CPAP machine order;   Patient uses 12 cm water  pressure on a set pressure CPAP is a ResMed  S 9 Escape. She had once been given a newer CPAP refurbished which was loud and not working well. She is  now needing a new one.  [facility] in [facility] through Dr. Brent Cambric  ( Epworth 18/ 24 points !) and provided through [facility].  Now served by [employer].  I have not ever tested her for sleep apnea before. She needs a new machine and I will order a baseline through [facility]. Aaron Aas    This patient was referred  2 years ago for a problem of RLS, was for years treated by PCP with skelaxin  and klonopin . She reported in 2023 she no longer received these medications and she has not found any other effective medication for this problem, staying awake due to RLS .Left leg pain comes from spinal stenosis, and pt has had epidural injections which allowed temporary relief.  1) Clonazepam   for RLS , take prn - but takes it every night - RLS / myoclonus.    2) CPAP for OSA  [facility]- , needs a baseline , ordered HST,    3) OSA  risk factor is obesity,  she snores loudly and chokes when sleeping without CPAP.      Epworth sleepiness score: 6-7 points.  On CPAP    FSS score. 37/ 63    GDS ; 2/ 15      BMI: 36.5 kg/m   Neck Circumference: 15.75"   FINDINGS:   Sleep Summary:   Total Recording Time (hours, min): 7 hours 27 minutes      Total Sleep Time (hours, min):    6 hours 15 minutes             Percent REM (%): 13.8%     Sleep latency was measured at 19 minutes duration and REM sleep latency at 100 minutes.                                     Respiratory Indices:   Calculated pAHI (per CMS guideline): 20/h     (per AASM guideline A AHI of 36.7 would have been reached).                    REM pAHI:    27.3/h                                             NREM pAHI:    18.8/h                          Positional AHI:   17.3/h in supine sleep-if he would follow AASM criteria this AHI would be 41/h.  Snoring: The mean volume reached 50 dB which is  very loud.  Snoring was present for more than two thirds of the total recorded sleep time.                                              Oxygen Saturation Statistics:   Oxygen Saturation (%) Mean:     92%           O2 Saturation Range (%): Between a nadir at 80 and the maximal saturation of 98%                                      O2 Saturation (minutes) <89%:     16.6 minutes, 4.4% of total sleep time      Pulse Rate Statistics:   Pulse Mean (bpm):     69 bpm            Pulse Range:    Between 53 and 109 bpm             IMPRESSION:  This HST confirms the presence of all obstructive sleep apnea, with an overall AHI of 20 following CMS criteria, associated with oxygen desaturation and very loud snoring.  PS : There were frequent brief oxygen desaturations noted and when applying the AASM criteria of 3% apnea scoring, this patient's apnea would have been severe OSA.   RECOMMENDATION: The continuation of CPAP therapy is strongly recommended and we will order a new auto titration capable CPAP device for this patient.  Since the patient has used an Escape 9 machine that is over a decade old and was set to 12 cm water  pressure she will now use a auto titration device with a pressure range between 8 and 15 cm water  2 cm EPR, heated humidification and the interface of her choice.  [facility] is the current DME The body mass index is still elevated and a major risk factor for obstructive sleep apnea to be present.  A reduction in weight could benefit snoring and apnea severity.     INTERPRETING PHYSICIAN:   Neomia Banner, MD  [facility] Sleep Board certified by The [employer] of Sleep Medicine and Diplomate of the [employer] of Sleep Medicine. Board certified In Neurology through the ABPN, Fellow of the [employer] of Neurology.

## 2023-11-21 ENCOUNTER — Ambulatory Visit: Payer: Self-pay | Admitting: Neurology

## 2023-11-21 DIAGNOSIS — Z9989 Dependence on other enabling machines and devices: Secondary | ICD-10-CM | POA: Insufficient documentation

## 2023-11-21 NOTE — Procedures (Signed)
 [facility]  Jamie [name] "[name]" 78 year old female February 04, 1946   HOME SLEEP TEST REPORT ( by Watch PAT)   STUDY DATE:  11-11-2023,     ORDERING CLINICIAN:  Neomia Banner, MD  REFERRING CLINICIAN:    CLINICAL INFORMATION/HISTORY: KERILYN BETTI is a 78 y.o. female patient who is here for revisit 10/29/2023 for a new CPAP machine order;   Patient uses 12 cm water  pressure on a set pressure CPAP is a ResMed  S 9 Escape. She had once been given a newer CPAP refurbished which was loud and not working well. She is  now needing a new one.  [facility] in [facility] through Dr. Brent Cambric  ( Epworth 18/ 24 points !) and provided through [facility].  Now served by [employer].  I have not ever tested her for sleep apnea before. She needs a new machine and I will order a baseline through [facility]. Aaron Aas    This patient was referred  2 years ago for a problem of RLS, was for years treated by PCP with skelaxin  and klonopin . She reported in 2023 she no longer received these medications and she has not found any other effective medication for this problem, staying awake due to RLS .Left leg pain comes from spinal stenosis, and pt has had epidural injections which allowed temporary relief.  1) Clonazepam   for RLS , take prn - but takes it every night - RLS / myoclonus.    2) CPAP for OSA  [facility]- , needs a baseline , ordered HST,    3) OSA  risk factor is obesity,  she snores loudly and chokes when sleeping without CPAP.      Epworth sleepiness score: 6-7 points.  On CPAP    FSS score. 37/ 63    GDS ; 2/ 15      BMI: 36.5 kg/m   Neck Circumference: 15.75"   FINDINGS:   Sleep Summary:   Total Recording Time (hours, min): 7 hours 27 minutes      Total Sleep Time (hours, min):    6 hours 15 minutes             Percent REM (%): 13.8%     Sleep latency was measured at 19 minutes duration and REM sleep latency at 100 minutes.                                     Respiratory Indices:   Calculated pAHI (per CMS guideline): 20/h     (per AASM guideline A AHI of 36.7 would have been reached).                    REM pAHI:    27.3/h                                             NREM pAHI:    18.8/h                          Positional AHI:   17.3/h in supine sleep-if he would follow AASM criteria this AHI would be 41/h.  Snoring: The mean volume reached 50 dB which is very loud.  Snoring was present for more than two thirds of the total recorded sleep time.                                              Oxygen Saturation Statistics:   Oxygen Saturation (%) Mean:     92%           O2 Saturation Range (%): Between a nadir at 80 and the maximal saturation of 98%                                      O2 Saturation (minutes) <89%:     16.6 minutes, 4.4% of total sleep time      Pulse Rate Statistics:   Pulse Mean (bpm):     69 bpm            Pulse Range:    Between 53 and 109 bpm             IMPRESSION:  This HST confirms the presence of all obstructive sleep apnea, with an overall AHI of 20 following CMS criteria, associated with oxygen desaturation and very loud snoring.  PS : There were frequent brief oxygen desaturations noted and when applying the AASM criteria of 3% apnea scoring, this patient's apnea would have been severe OSA.   RECOMMENDATION: The continuation of CPAP therapy is strongly recommended and we will order a new auto titration capable CPAP device for this patient.  Since the patient has used an Escape 9 machine that is over a decade old and was set to 12 cm water  pressure she will now use a auto titration device with a pressure range between 8 and 15 cm water  2 cm EPR, heated humidification and the interface of her choice.  [facility] is the current DME The body mass index is still elevated and a major risk factor for obstructive sleep apnea to be present.  A reduction in weight could benefit snoring and apnea severity.     INTERPRETING PHYSICIAN:  Neomia Banner, MD  [facility] Sleep Board certified by The [employer] of Sleep Medicine and Diplomate of the [employer] of Sleep Medicine. Board certified In Neurology through the ABPN, Fellow of the [employer] of Neurology.

## 2023-11-23 ENCOUNTER — Encounter: Payer: Self-pay | Admitting: Neurology

## 2023-11-24 NOTE — Telephone Encounter (Signed)
 Cld Pt to discuss sleep study and DME. No answer, LVM for call back.

## 2023-11-25 ENCOUNTER — Telehealth: Payer: Self-pay

## 2023-11-25 DIAGNOSIS — Z9989 Dependence on other enabling machines and devices: Secondary | ICD-10-CM

## 2023-11-25 DIAGNOSIS — G4733 Obstructive sleep apnea (adult) (pediatric): Secondary | ICD-10-CM

## 2023-11-25 NOTE — Telephone Encounter (Signed)
 Patient left a voicemail returning your call.

## 2023-11-25 NOTE — Telephone Encounter (Signed)
 Spoke w/Pt regarding CPAP results. Pt had read results on Jamie [name] and had no questions other than when she will be getting her new machine. Discussed her DME provider. Pt stated she had switched to [facility] from [facility]. Informed Pt the order will be sent to [facility] and she will be receiving a call from them within about one week to schedule her set up. Pt voiced understanding and thankful for the call.

## 2023-12-19 ENCOUNTER — Telehealth: Payer: Self-pay | Admitting: Neurology

## 2023-12-19 NOTE — Telephone Encounter (Signed)
 Pt called in regards to picking up new CPAP  but the air way only start starts at 8 it is suppose to be up to 12 . Pt is stating that it hard to sleep . Pt would like for MD to call [facility] and give them permission to fix Cpap Machine'  Callback [place] [employer] officer, political party ) Phone # [phone]

## 2023-12-22 NOTE — Telephone Encounter (Signed)
 Pt called back. Explained to Pt the CPAP is set to automatically increase as her apnea demands. Pt stated she cannot go to sleep with machine at 8 because she feels she has to breathe to deeply and it is hard for her to go to sleep like that. Pt wants Dr. Albertina Hugger to change the settings. Informed Pt will speak with provider. Pt voiced understanding.

## 2023-12-22 NOTE — Addendum Note (Signed)
 Addended by: Khyron Garno K on: 12/22/2023 01:22 PM   Modules accepted: Orders

## 2023-12-22 NOTE — Telephone Encounter (Signed)
 Attempted to call Pt. No answer, unable to LVM. Spoke w/Danielle at [facility] and pressures are set 8 to 15 as ordered and that Elyse had gone over all this with Pt - but that if she wants her pressures changed it will require MD order. Danielle states Pt wore CPAP over 5 hours last night - and did very well. Will convey this to Pt when able to reach her.

## 2023-12-25 ENCOUNTER — Encounter: Payer: Self-pay | Admitting: Family Medicine

## 2023-12-25 ENCOUNTER — Ambulatory Visit (INDEPENDENT_AMBULATORY_CARE_PROVIDER_SITE_OTHER): Admitting: Family Medicine

## 2023-12-25 ENCOUNTER — Other Ambulatory Visit: Payer: Self-pay

## 2023-12-25 VITALS — BP 122/80 | HR 82 | Ht 62.5 in | Wt 201.0 lb

## 2023-12-25 DIAGNOSIS — G8929 Other chronic pain: Secondary | ICD-10-CM

## 2023-12-25 DIAGNOSIS — M25512 Pain in left shoulder: Secondary | ICD-10-CM | POA: Diagnosis not present

## 2023-12-25 NOTE — Patient Instructions (Addendum)
 Thank you for coming in today.   You received an injection today. Seek immediate medical attention if the joint becomes red, extremely painful, or is oozing fluid.   I've referred you to Physical Therapy.  Let us know if you don't hear from them in one week.   Check back as needed

## 2023-12-25 NOTE — Progress Notes (Signed)
 I, Miquel Amen, CMA acting as a scribe for Garlan Juniper, MD.  Jamie [name] is a 78 y.o. female who presents to [facility] today for left shoulder pain. Pt last saw Dr. Alease Hunter for left shoulder pain on 09/25/23. XR of the shoulder was performed and pt was referred to PT at [facility]. Radiating into the inner arm, n/t in the hand at night, limited ROM, aching pain. Pt has completed 1 visit with PT.   Today, patient reports trouble picking things up and opening the car door. Notes falling and bruising her rib cage and having to take a break from PT. Was carrying something up stairs and landed on right side. Notes pain and limited ROM. N/T has resolved, outside of carpal tunnel sx.   She is leaving for trip next week for a conference.  Pertinent review of systems: No fevers or chills  Relevant historical information: TIA.  History of renal cell carcinoma on the right   Exam:  BP 122/80   Pulse 82   Ht 5' 2.5 (1.588 m)   Wt 201 lb (91.2 kg)   SpO2 97%   BMI 36.18 kg/m  General: Well Developed, well nourished, and in no acute distress.   MSK: Left shoulder: Normal-appearing decreased range of motion limited abduction to 110 degrees.  Strength is intact. Positive Hawkins and Neer's test.    Lab and Radiology Results  Procedure: Real-time Ultrasound Guided Injection of left shoulder subacromial bursa Device: Philips Affiniti 50G/GE Logiq Images permanently stored and available for review in PACS Verbal informed consent obtained.  Discussed risks and benefits of procedure. Warned about infection, bleeding, hyperglycemia damage to structures among others. Patient expresses understanding and agreement Time-out conducted.   Noted no overlying erythema, induration, or other signs of local infection.   Skin prepped in a sterile fashion.   Local anesthesia: Topical Ethyl chloride.   With sterile technique and under real time ultrasound guidance: 40  mg of Kenalog and 2 mL of Marcaine  injected into subacromial bursa. Fluid seen entering the bursa.   Completed without difficulty   Pain immediately resolved suggesting accurate placement of the medication.   Advised to call if fevers/chills, erythema, induration, drainage, or persistent bleeding.   Images permanently stored and available for review in the ultrasound unit.  Impression: Technically successful ultrasound guided injection.        Assessment and Plan: 78 y.o. female with chronic left shoulder pain.  This has been ongoing for greater than 3 months.  She did have an initial physical therapy session about 2 months ago which was derailed after she fell and bruised some ribs on the right.  Plan for subacromial injection and refer to PT.  She has an established relationship with physical therapy at [facility].  If this shot does not work very well could try glenohumeral injection.  Recheck back as needed.   PDMP not reviewed this encounter. Orders Placed This Encounter  Procedures   US  LIMITED JOINT SPACE STRUCTURES UP LEFT(NO LINKED CHARGES)    Reason for Exam (SYMPTOM  OR DIAGNOSIS REQUIRED):   left shoulder pain    Preferred imaging location?:   [facility] referral to Physical Therapy    Referral Priority:   Routine    Referral Type:   Physical Medicine    Referral Reason:   Specialty Services Required    Requested Specialty:   Physical Therapy    Number of Visits  Requested:   1   No orders of the defined types were placed in this encounter.    Discussed warning signs or symptoms. Please see discharge instructions. Patient expresses understanding.   The above documentation has been reviewed and is accurate and complete Garlan Juniper, M.D.

## 2023-12-31 ENCOUNTER — Telehealth: Payer: Self-pay | Admitting: Internal Medicine

## 2023-12-31 NOTE — Telephone Encounter (Signed)
 Patient currently scheduled for TOC from Dr. Rollene to you on 01/07/24. Please advise.

## 2024-01-05 ENCOUNTER — Other Ambulatory Visit: Payer: Self-pay | Admitting: Internal Medicine

## 2024-01-06 DIAGNOSIS — R519 Headache, unspecified: Secondary | ICD-10-CM | POA: Diagnosis not present

## 2024-01-06 DIAGNOSIS — R051 Acute cough: Secondary | ICD-10-CM | POA: Diagnosis not present

## 2024-01-06 DIAGNOSIS — J029 Acute pharyngitis, unspecified: Secondary | ICD-10-CM | POA: Diagnosis not present

## 2024-01-07 ENCOUNTER — Encounter: Admitting: Physician Assistant

## 2024-01-23 NOTE — Progress Notes (Signed)
 SABRA

## 2024-01-26 ENCOUNTER — Ambulatory Visit (INDEPENDENT_AMBULATORY_CARE_PROVIDER_SITE_OTHER): Admitting: Neurology

## 2024-01-26 ENCOUNTER — Encounter: Payer: Self-pay | Admitting: Neurology

## 2024-01-26 VITALS — BP 128/78 | HR 80 | Ht 62.0 in | Wt 203.0 lb

## 2024-01-26 DIAGNOSIS — I10 Essential (primary) hypertension: Secondary | ICD-10-CM | POA: Diagnosis not present

## 2024-01-26 DIAGNOSIS — G4733 Obstructive sleep apnea (adult) (pediatric): Secondary | ICD-10-CM

## 2024-01-26 DIAGNOSIS — G2581 Restless legs syndrome: Secondary | ICD-10-CM | POA: Diagnosis not present

## 2024-01-26 MED ORDER — CLONAZEPAM 0.5 MG PO TABS: 0.5000 mg | ORAL_TABLET | Freq: Every day | ORAL | 2 refills | Status: DC

## 2024-01-26 NOTE — Progress Notes (Signed)
 Provider:  Dedra Gores, MD  Primary Care Physician:  Rollene Almarie LABOR, MD [address] [place] [address]     Referring Provider: Rollene Almarie LABOR, Md [address] [place],  [place] [address]          Chief Complaint according to patient   Patient presents with:                HISTORY OF PRESENT ILLNESS:  Jamie [name] is a 78 y.o. female patient who is here for her first COMPLIANCE  revisit on a new CPAP machine 01/26/2024 . Her husband is  today in the hospital with hyponatremia, and is confused, has PD related dementia.  The confusional spells are also present at home- This weighs heavy on her.   She has been compliant  uses the machine up to 9 hours but is coughing a lot. She is reporting not noticing the large air leak but her machine switches itself off- and that can be the machines response to the air leak.   OSA was last tested  11-11-2023:  AHI by CMS based was 20/h and is not 02/h on CPAP.   She is using an old mask .    Jamie [name] is a 78 y.o. female patient who is here for revisit 10/29/2023 for a new CPAP machine order;   Patient uses 12 cm water  pressure on a set pressure CPAP is a ResMed  S 9 Escape. She had once been given a newer CPAP refurbished which was loud and not working well. She is  now needing a new one.  Last sleep study in 2012 by [facility] ordered through Dr. Brien  ( Epworth 18/ 24 points !) and provided through [facility]. Now served by [employer].  I have not ever tested her for sleep apnea before. She needs a new machine and I will order a baseline through MEDICARE. SABRA    This patient was referred  2 years ago for a problem of RLS, was for years treated by PCP with skelaxin  and klonopin . She reported in 2023 she no longer received these medications and she has not found any other effective medication for this problem, staying awake due to RLS .Left leg pain comes from spinal stenosis, and pt has had epidural  injections which allowed temporary relief.  1) Clonazepam   for RLS , take prn - but takes it every night - RLS / myoclonus.   2) CPAP for OSA  APRIA- , needs a baseline , ordered HST,   3) OSA  risk factor is obesity,  she snores loudly and chokes when sleeping without CPAP.        Epworth sleepiness score: 6-7 points.  On CPAP  FSS score. 37/ 63  GDS ; 2/ 15    Fam Hx : see below   Social HX; See above      Review of Systems: Out of a complete 14 system review, the patient complains of only the following symptoms, and all other reviewed systems are negative.:   SLEEPINESS ?  How likely are you to doze in the following situations: 0 = not likely, 1 = slight chance, 2 = moderate chance, 3 = high chance  Sitting and Reading? Watching Television? Sitting inactive in a public place (theater or meeting)? Lying down in the afternoon when circumstances permit? Sitting and talking to someone? Sitting quietly after lunch without alcohol? In a car,  while stopped for a few minutes in traffic? As a passenger in a car for an hour without a break?  Total = 15 , up fro 6  due to coughing, cannot use CPAP while having this infection, chest feels tight.   FSS 49/ 63 .      Social History   Socioeconomic History   Marital status: Married    Spouse name: Jamie [name]   Number of children: 2   Years of education: 15   Highest education level: Bachelor's degree (e.g., BA, AB, BS)  Occupational History   Occupation: Retired    Associate Professor: OTHER    Comment: real estate agent  Tobacco Use   Smoking status: Never   Smokeless tobacco: Never  Vaping Use   Vaping status: Never Used  Substance and Sexual Activity   Alcohol use: Yes    Alcohol/week: 2.0 - 3.0 standard drinks of alcohol    Types: 1 - 2 Glasses of wine, 1 Shots of liquor per week    Comment: occassionally   Drug use: No   Sexual activity: Yes    Birth control/protection: Surgical  Other Topics Concern   Not on file   Social History Narrative   Married Jamie [name]) and lives with her spouse.   Retired Research officer, political party.   Education- college   Patient is right handed.   Patient drinks tea a lot and water .   Social Drivers of Corporate investment banker Strain: Low Risk  (05/05/2023)   Overall Financial Resource Strain (CARDIA)    Difficulty of Paying Living Expenses: Not hard at all  Food Insecurity: No Food Insecurity (05/05/2023)   Hunger Vital Sign    Worried About Running Out of Food in the Last Year: Never true    Ran Out of Food in the Last Year: Never true  Transportation Needs: No Transportation Needs (05/05/2023)   PRAPARE - Administrator, Civil Service (Medical): No    Lack of Transportation (Non-Medical): No  Physical Activity: Insufficiently Active (05/05/2023)   Exercise Vital Sign    Days of Exercise per Week: 1 day    Minutes of Exercise per Session: 20 min  Stress: No Stress Concern Present (05/05/2023)   [employer] of Occupational Health - Occupational Stress Questionnaire    Feeling of Stress : Not at all  Social Connections: Socially Integrated (05/05/2023)   Social Connection and Isolation Panel    Frequency of Communication with Friends and Family: More than three times a week    Frequency of Social Gatherings with Friends and Family: Three times a week    Attends Religious Services: 1 to 4 times per year    Active Member of Clubs or Organizations: Yes    Attends Engineer, structural: More than 4 times per year    Marital Status: Married    Family History  Problem Relation Age of Onset   Hypertension Mother    Anxiety disorder Mother    Heart disease Father 25   Lung cancer Father        smoker   Lung cancer Sister    Heart disease Brother    Allergies Brother    Allergies Daughter    Melanoma Daughter    Colon cancer Cousin    Hypertension Other    Liver disease Neg Hx    Asthma Neg Hx    Esophageal cancer Neg Hx    Rectal cancer  Neg Hx    Stomach cancer Neg  Hx    Liver cancer Neg Hx    Prostate cancer Neg Hx    Pancreatic cancer Neg Hx     Past Medical History:  Diagnosis Date   Allergic rhinitis    Anxiety    Arthritis    left foot   Asthma    Back pain    Blood transfusion without reported diagnosis    Colon polyps    adenomatous   Constipation    Diverticulosis    Dysrhythmia 1999   Dr Delford 2003   Elevated LFTs    Fatty liver disease, nonalcoholic    GERD (gastroesophageal reflux disease)    Headache(784.0)    migraines with vision changes but no pain   Heart murmur    Hypertension    IBS (irritable bowel syndrome)    Joint pain    Migraines    Muscle cramps    Neuromuscular disorder (HCC)    RLS   Obesity    Palpitations    Pneumonia 2007   Renal cell carcinoma (HCC) 2013   minimal partial nephrectomy   Restless leg syndrome    Sleep apnea    moderate; wears CPAP, uncertain about setting   Stroke [facility])    TIA (transient ischemic attack) 09/11/2014   Ulcer    Urinary incontinence    Vitamin D  deficiency     Past Surgical History:  Procedure Laterality Date   BREAST REDUCTION SURGERY  Jan 1991   CHOLECYSTECTOMY N/A 08/08/2023   Procedure: LAPAROSCOPIC CHOLECYSTECTOMY;  Surgeon: Polly Cordella LABOR, MD;  Location: [facility];  Service: General;  Laterality: N/A;   ELBOW SURGERY Left    KIDNEY SURGERY Right October 2013    Tumor removed    LAPAROSCOPIC APPENDECTOMY N/A 06/08/2018   Procedure: APPENDECTOMY LAPAROSCOPIC;  Surgeon: Curvin Mt III, MD;  Location: [facility];  Service: General;  Laterality: N/A;   TONSILLECTOMY     VAGINAL HYSTERECTOMY  1979   partial     Current Outpatient Medications on File Prior to Visit  Medication Sig Dispense Refill   aspirin  EC 81 MG tablet Take 1 tablet (81 mg total) by mouth daily. 90 tablet 3   clidinium-chlordiazePOXIDE  (LIBRAX ) 5-2.5 MG capsule TAKE 1 CAPSULE TWICE A DAY (OFFICE VISIT FOR FURTHER REFILLS) 180 capsule 0   clonazePAM   (KLONOPIN ) 0.5 MG tablet Take 1 tablet (0.5 mg total) by mouth at bedtime. 90 tablet 1   Collagen-Vitamin C-Biotin (COLLAGEN PO) Take 1 tablet by mouth daily.     esomeprazole  (NEXIUM ) 40 MG capsule TAKE 1 CAPSULE TWICE A DAY BEFORE MEALS 180 capsule 3   famotidine  (PEPCID ) 20 MG tablet Take 1 tablet (20 mg total) by mouth 2 (two) times daily. 180 tablet 3   linaclotide  (LINZESS ) 290 MCG CAPS capsule TAKE 1 CAPSULE DAILY BEFORE BREAKFAST 90 capsule 3   meloxicam  (MOBIC ) 15 MG tablet Take 1 tablet (15 mg total) by mouth daily. 30 tablet 0   metaxalone  (SKELAXIN ) 400 MG tablet Take 1 tablet (400 mg total) by mouth at bedtime. 90 tablet 3   metFORMIN  (GLUCOPHAGE ) 500 MG tablet Take 1 tablet (500 mg total) by mouth daily with breakfast. 90 tablet 3   Multiple Vitamins-Minerals (CENTRUM SILVER 50+WOMEN) TABS Take 1 tablet by mouth daily.     Omega-3-6-9 CAPS Take 1 capsule by mouth every evening.     Probiotic Product (PROBIOTIC-10 PO) Take 1 capsule by mouth 2 (two) times daily.      Rimegepant Sulfate (NURTEC) 75 MG TBDP  Take 75 mg by mouth daily as needed (take for abortive therapy of migraine, no more than 1 tablet in 24 hours or 8 per month). 8 tablet 11   RSV vaccine recomb adjuvanted (AREXVY ) 120 MCG/0.5ML injection Inject into the muscle. 1 each 0   spironolactone  (ALDACTONE ) 50 MG tablet Take 1 tablet (50 mg total) by mouth daily. 90 tablet 3   TURMERIC PO Take 1 tablet by mouth daily.     No current facility-administered medications on file prior to visit.    Allergies  Allergen Reactions   Buprenorphine Hcl Rash and Shortness Of Breath   Morphine  And Codeine  Shortness Of Breath and Rash   Morphine  And Codeine  Shortness Of Breath   Aspirin  Other (See Comments)    REACTION: ulcers  Can take 81 mg   Avelox [Moxifloxacin Hcl In Nacl] Hives   Diflucan [Fluconazole] Hives    Whelps and rash c skin peeling   Diflucan [Fluconazole] Hives   Hydrochlorothiazide Other (See Comments)    Latex Rash   Moxifloxacin Palpitations    Tachycardia      DIAGNOSTIC DATA (LABS, IMAGING, TESTING) - I reviewed patient records, labs, notes, testing and imaging myself where available.  Lab Results  Component Value Date   WBC 4.9 08/05/2023   HGB 12.9 08/05/2023   HCT 40.2 08/05/2023   MCV 97.6 08/05/2023   PLT 194 08/05/2023      Component Value Date/Time   NA 140 08/05/2023 1351   NA 142 10/18/2019 1228   K 4.7 08/05/2023 1351   CL 108 08/05/2023 1351   CO2 24 08/05/2023 1351   GLUCOSE 103 (H) 08/05/2023 1351   BUN 18 08/05/2023 1351   BUN 19 10/18/2019 1228   CREATININE 0.75 08/05/2023 1351   CALCIUM 10.0 08/05/2023 1351   PROT 7.6 06/26/2023 1514   PROT 7.5 10/18/2019 1228   ALBUMIN 4.5 06/26/2023 1514   ALBUMIN 5.0 (H) 10/18/2019 1228   AST 48 (H) 06/26/2023 1514   ALT 66 (H) 06/26/2023 1514   ALKPHOS 60 06/26/2023 1514   BILITOT 0.7 06/26/2023 1514   BILITOT 0.3 10/18/2019 1228   GFRNONAA >60 08/05/2023 1351   GFRAA 70 10/18/2019 1228   Lab Results  Component Value Date   CHOL 186 05/05/2023   HDL 74.80 05/05/2023   LDLCALC 85 05/05/2023   TRIG 132.0 05/05/2023   CHOLHDL 2 05/05/2023   Lab Results  Component Value Date   HGBA1C 5.3 05/05/2023   Lab Results  Component Value Date   VITAMINB12 669 04/30/2023   Lab Results  Component Value Date   TSH 1.35 05/05/2023    PHYSICAL EXAM:  Vitals:   01/26/24 1407  BP: 128/78  Pulse: 80   No data found. Body mass index is 37.13 kg/m.   Wt Readings from Last 3 Encounters:  01/26/24 203 lb (92.1 kg)  12/25/23 201 lb (91.2 kg)  10/29/23 203 lb (92.1 kg)     Ht Readings from Last 3 Encounters:  01/26/24 5' 2 (1.575 m)  12/25/23 5' 2.5 (1.588 m)  10/29/23 5' 2.5 (1.588 m)      General: The patient is awake, alert and appears not in acute distress and groomed. Head: Normocephalic, atraumatic.  Neck is supple. General: The patient is awake, alert and appears not in acute distress. The  patient is well groomed. Head: Normocephalic, atraumatic. Neck is supple. Body mass index is 35.48 kg/m.     Neck size:  15. 75   <Mallampati ;  3 plus.  Lymph node swelling left neck.  Generalized: Well developed, in no acute distress .  Cardiology: normal rate and rhythm, no murmur noted Respiratory: deferred.  Neurological examination  Mentation:  Alert oriented to time, place, history taking. Follows all commands speech and language fluent Cranial nerve : no loss of taste and smell-   Pupils were equal in size, round reactive to light. Extraocular movements were full, visual field were full  Motor: symmetric tone and mass, no cogwheeling.    Gait and station:  Wider based gait, slightly stooped. intact,     ASSESSMENT AND PLAN :   78 y.o. year old female  here with:    1) poor seal with new mask on CPAP - machine now switches off.   Water  chamber is empty every night !  [facility] - asking to fit a new mask . I went to the lab and found a S?M size f 20 FFM and we tried that on here- it seems to fit.    We gave her the sample mask airfit ResMed F20  S/M     2) Clonazepam  for RLS , take prn - but takes it every night - RLS / myoclonus.  Refilled.   3)  Rv with NP in 6 months for air leak.   4)  Cough - repeated lung infections reported.  Please contact primary Care physician for lung exam.    I would like to thank Rollene Almarie LABOR, Md [address] [place],  [place] [address] for allowing me to meet with this pleasant patient.   Sleep Clinic Patients are generally offered input on sleep hygiene, life style changes and how to improve compliance with medical treatment where applicable. Review and reiteration of good sleep hygiene measures is offered to any sleep clinic patient, be it in the first consultation or with any follow up visits.  Any CPAP patient should be reminded to be fully compliant with PAP therapy , (defined as using PAP therapy for more than 4  hours each night ) with the goal to improve sleep related symptoms and decrease long term cardiovascular risks.  Please note that untreated obstructive sleep apnea may carry additional perioperative morbidity. Patients with significant obstructive sleep apnea should receive perioperative PAP therapy and the surgical team should be informed of the diagnosis and degree of sleep disordered breathing.  Sleep fragmentation in the presence of normal proportional sleep stages is a nonspecific findings and per se does not signify an intrinsic sleep disorder or a cause for the patient's sleep-related symptoms.   Any patient with sleepiness should be cautioned not to drive, work at heights, or operate dangerous or heavy equipment when feeling tired or sleepy.   The patient will be seen in follow-up in the sleep clinic at [facility] for discussion of test results, sleep related symptoms and treatment compliance review, further management strategies, etc.   The referring provider will be notified of the test results.   The patient's condition requires frequent monitoring and adjustments in the treatment plan, reflecting the ongoing complexity of care.  This provider is the continuing focal point for all needed services for this condition.  After spending a total time of  30  minutes face to face and time for  history taking, physical and neurologic examination, review of laboratory studies,  personal review of imaging studies, reports and results of other testing and review of referral information / records as far as provided in visit,   Electronically  signed by: Dedra Gores, MD 01/26/2024 3:00 PM  [facility] and [facility] Sleep Board certified by The [employer] of Sleep Medicine and Diplomate of the [employer] of Sleep Medicine. Board certified In Neurology through the ABPN, Fellow of the [employer] of Neurology.

## 2024-01-26 NOTE — Patient Instructions (Signed)
   78 - year old female here with:  Coughing affecting sleep and CPAP use  Excessive daytime sleepiness has returned for now.     1) There is a poor seal with new mask on CPAP - machine now switches off.   Water  chamber is empty every night !  [facility] - asking to fit a new mask . I went to the lab and found a S?M size f 20 FFM and we tried that on here- it seems to fit. We gave her the sample mask airfit ResMed F20  S/M     2) Clonazepam  for RLS , take prn - but takes it every night - RLS / myoclonus.  Refilled.    3)  Rv with NP in 6 months for air leak.    And for Cough - repeated lung infections reported.  Please contact primary Care physician for lung exam.

## 2024-01-27 ENCOUNTER — Ambulatory Visit
Admission: RE | Admit: 2024-01-27 | Discharge: 2024-01-27 | Disposition: A | Discharge status: Home / Self Care | Attending: Physician Assistant | Admitting: Physician Assistant

## 2024-01-27 ENCOUNTER — Other Ambulatory Visit: Payer: Self-pay

## 2024-01-27 VITALS — BP 127/83 | HR 76 | Temp 98.3°F | Resp 17 | Ht 62.5 in | Wt 200.0 lb

## 2024-01-27 DIAGNOSIS — J069 Acute upper respiratory infection, unspecified: Secondary | ICD-10-CM

## 2024-01-27 LAB — POC COVID19/FLU A&B COMBO
Covid Antigen, POC: NEGATIVE
Influenza A Antigen, POC: NEGATIVE
Influenza B Antigen, POC: NEGATIVE

## 2024-01-27 LAB — POCT RAPID STREP A (OFFICE): Rapid Strep A Screen: NEGATIVE

## 2024-01-27 NOTE — ED Triage Notes (Addendum)
 Pt presents with complaints of shortness of breath, sore throat, cough, headaches, nasal congestion, chest tightness, and feeling overall fatigued x 6 days. Currently rates overall pain a 5/10. OTC Tylenol  taken at home. Was recently at [facility] with husband. Pt believes she may have picked up something while there. Denies fevers at home, states she is having chills.

## 2024-01-27 NOTE — ED Provider Notes (Signed)
 [facility]    CSN: [id number] Arrival date & time: 01/27/24  1316      History   Chief Complaint Chief Complaint  Patient presents with   Sore Throat    Took husband to [facility] ER a week ago Monday. ER waiting room was full. I think I caught something there. Husband was admitted and released this past Thursday.Headache, running sinus, cough (dry and wet), sneezing, tired, tight chest... - Entered by patient   Shortness of Breath   Cough    HPI Jamie [name] is a 78 y.o. female.   HPI  Pt reports she has been having sore throat, predominantly dry coughing, headaches, chest tightness, body aches from coughing She does admit some chills but does not think she has had a fever.   She denies nausea, vomiting or diarrhea  She denies similar symptoms in other members of household. She reports her symptoms started after she was in the hospital visiting her husband last week so she is unsure if she picked up something there Interventions: Tylenol  for headaches   She denies previous hx of asthma or COPD  She reports a remote hx of pneumonia in early 2000s    Past Medical History:  Diagnosis Date   Allergic rhinitis    Anxiety    Arthritis    left foot   Asthma    Back pain    Blood transfusion without reported diagnosis    Colon polyps    adenomatous   Constipation    Diverticulosis    Dysrhythmia 1999   Dr Delford 2003   Elevated LFTs    Fatty liver disease, nonalcoholic    GERD (gastroesophageal reflux disease)    Headache(784.0)    migraines with vision changes but no pain   Heart murmur    Hypertension    IBS (irritable bowel syndrome)    Joint pain    Migraines    Muscle cramps    Neuromuscular disorder (HCC)    RLS   Obesity    Palpitations    Pneumonia 2007   Renal cell carcinoma (HCC) 2013   minimal partial nephrectomy   Restless leg syndrome    Sleep apnea    moderate; wears CPAP, uncertain about setting   Stroke [facility])    TIA  (transient ischemic attack) 09/11/2014   Ulcer    Urinary incontinence    Vitamin D  deficiency     Patient Active Problem List   Diagnosis Date Noted   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 11/21/2023   RLS (restless legs syndrome) 10/29/2023   OSA on CPAP 10/29/2023   Renal cell cancer, right (HCC) 10/29/2023   Neuropathy 10/02/2021   Insomnia 09/21/2021   Pain in left hip 09/07/2020   Bilateral leg pain 08/10/2020   Vitamin D  deficiency 08/25/2017   Pre-diabetes 08/25/2017   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   TIA (transient ischemic attack) 12/19/2016   Common migraine 12/19/2016   Obstructive sleep apnea on CPAP 12/19/2016   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) 11/21/2016   Morbid obesity (HCC) 09/16/2014   Kidney mass 03/10/2012   Rosacea 05/07/2011   Shoulder pain, right 05/07/2011   Tinnitus 08/09/2010   Fatigue 01/16/2009   Allergic rhinitis 04/28/2008   Restrictive lung disease 04/28/2008   RESTLESS LEG SYNDROME 02/02/2007   Essential hypertension 02/02/2007   MITRAL VALVE PROLAPSE 02/02/2007   GERD 02/02/2007   Irritable bowel syndrome 02/02/2007   BREAST CYST 02/02/2007  Past Surgical History:  Procedure Laterality Date   BREAST REDUCTION SURGERY  Jan 1991   CHOLECYSTECTOMY N/A 08/08/2023   Procedure: LAPAROSCOPIC CHOLECYSTECTOMY;  Surgeon: Polly Cordella LABOR, MD;  Location: [facility];  Service: General;  Laterality: N/A;   ELBOW SURGERY Left    KIDNEY SURGERY Right October 2013    Tumor removed    LAPAROSCOPIC APPENDECTOMY N/A 06/08/2018   Procedure: APPENDECTOMY LAPAROSCOPIC;  Surgeon: Curvin Mt III, MD;  Location: [facility];  Service: General;  Laterality: N/A;   TONSILLECTOMY     VAGINAL HYSTERECTOMY  1979   partial    OB History     Gravida  2   Para  0   Term  0   Preterm  0   AB  0   Living  2      SAB  0   IAB  0   Ectopic  0   Multiple      Live Births                Home Medications    Prior to Admission medications   Medication Sig Start Date End Date Taking? Authorizing Provider  aspirin  EC 81 MG tablet Take 1 tablet (81 mg total) by mouth daily. 09/16/14   Dohmeier, Dedra, MD  clidinium-chlordiazePOXIDE  (LIBRAX ) 5-2.5 MG capsule TAKE 1 CAPSULE TWICE A DAY (OFFICE VISIT FOR FURTHER REFILLS) 01/05/24   Abran Norleen SAILOR, MD  clonazePAM  (KLONOPIN ) 0.5 MG tablet Take 1 tablet (0.5 mg total) by mouth at bedtime. 01/26/24   Dohmeier, Dedra, MD  Collagen-Vitamin C-Biotin (COLLAGEN PO) Take 1 tablet by mouth daily.    [provider]  esomeprazole  (NEXIUM ) 40 MG capsule TAKE 1 CAPSULE TWICE A DAY BEFORE MEALS 10/27/23   Abran Norleen SAILOR, MD  famotidine  (PEPCID ) 20 MG tablet Take 1 tablet (20 mg total) by mouth 2 (two) times daily. 10/27/23   Abran Norleen SAILOR, MD  linaclotide  (LINZESS ) 290 MCG CAPS capsule TAKE 1 CAPSULE DAILY BEFORE BREAKFAST 10/27/23   Abran Norleen SAILOR, MD  meloxicam  (MOBIC ) 15 MG tablet Take 1 tablet (15 mg total) by mouth daily. 10/15/23   Leonce Katz, DO  metaxalone  (SKELAXIN ) 400 MG tablet Take 1 tablet (400 mg total) by mouth at bedtime. 05/06/23   Rollene Almarie LABOR, MD  metFORMIN  (GLUCOPHAGE ) 500 MG tablet Take 1 tablet (500 mg total) by mouth daily with breakfast. 05/06/23   Rollene Almarie LABOR, MD  Multiple Vitamins-Minerals (CENTRUM SILVER 50+WOMEN) TABS Take 1 tablet by mouth daily.    [provider]  Omega-3-6-9 CAPS Take 1 capsule by mouth every evening.    [provider]  Probiotic Product (PROBIOTIC-10 PO) Take 1 capsule by mouth 2 (two) times daily.     [provider]  Rimegepant Sulfate (NURTEC) 75 MG TBDP Take 75 mg by mouth daily as needed (take for abortive therapy of migraine, no more than 1 tablet in 24 hours or 8 per month). 12/04/21   Dohmeier, Dedra, MD  RSV vaccine recomb adjuvanted (AREXVY ) 120 MCG/0.5ML injection Inject into the muscle. 05/16/23   Luiz Channel, MD   spironolactone  (ALDACTONE ) 50 MG tablet Take 1 tablet (50 mg total) by mouth daily. 05/06/23   Rollene Almarie LABOR, MD  TURMERIC PO Take 1 tablet by mouth daily.    [provider]    Family History Family History  Problem Relation Age of Onset   Hypertension Mother    Anxiety disorder Mother    Heart disease Father  42   Lung cancer Father        smoker   Lung cancer Sister    Heart disease Brother    Allergies Brother    Allergies Daughter    Melanoma Daughter    Colon cancer Cousin    Hypertension Other    Liver disease Neg Hx    Asthma Neg Hx    Esophageal cancer Neg Hx    Rectal cancer Neg Hx    Stomach cancer Neg Hx    Liver cancer Neg Hx    Prostate cancer Neg Hx    Pancreatic cancer Neg Hx     Social History Social History   Tobacco Use   Smoking status: Never   Smokeless tobacco: Never  Vaping Use   Vaping status: Never Used  Substance Use Topics   Alcohol use: Yes    Alcohol/week: 2.0 - 3.0 standard drinks of alcohol    Types: 1 - 2 Glasses of wine, 1 Shots of liquor per week    Comment: occassionally   Drug use: No     Allergies   Buprenorphine hcl, Morphine  and codeine , Morphine  and codeine , Aspirin , Avelox [moxifloxacin hcl in nacl], Diflucan [fluconazole], Diflucan [fluconazole], Hydrochlorothiazide, Latex, and Moxifloxacin   Review of Systems Review of Systems  Constitutional:  Positive for fatigue. Negative for chills and fever.  HENT:  Positive for congestion, rhinorrhea and sore throat.   Respiratory:  Positive for cough, chest tightness and shortness of breath. Negative for wheezing.   Gastrointestinal:  Negative for diarrhea, nausea and vomiting.  Musculoskeletal:  Positive for myalgias.  Skin:  Negative for rash.  Neurological:  Positive for headaches.     Physical Exam Triage Vital Signs ED Triage Vitals  Encounter Vitals Group     BP 01/27/24 1324 127/83     Girls Systolic BP Percentile --      Girls Diastolic BP  Percentile --      Boys Systolic BP Percentile --      Boys Diastolic BP Percentile --      Pulse Rate 01/27/24 1324 76     Resp 01/27/24 1324 17     Temp 01/27/24 1324 98.3 F (36.8 C)     Temp Source 01/27/24 1324 Oral     SpO2 01/27/24 1324 95 %     Weight 01/27/24 1324 200 lb (90.7 kg)     Height 01/27/24 1324 5' 2.5 (1.588 m)     Head Circumference --      Peak Flow --      Pain Score 01/27/24 1332 5     Pain Loc --      Pain Education --      Exclude from Growth Chart --    No data found.  Updated Vital Signs BP 127/83 (BP Location: Right Arm)   Pulse 76   Temp 98.3 F (36.8 C) (Oral)   Resp 17   Ht 5' 2.5 (1.588 m)   Wt 200 lb (90.7 kg)   SpO2 95%   BMI 36.00 kg/m   Visual Acuity Right Eye Distance:   Left Eye Distance:   Bilateral Distance:    Right Eye Near:   Left Eye Near:    Bilateral Near:     Physical Exam Vitals reviewed.  Constitutional:      General: She is awake. She is not in acute distress.    Appearance: Normal appearance. She is well-developed and well-groomed. She is not ill-appearing or toxic-appearing.  HENT:  Head: Normocephalic and atraumatic.     Right Ear: Hearing, tympanic membrane and ear canal normal.     Left Ear: Hearing, tympanic membrane and ear canal normal.     Mouth/Throat:     Lips: Pink.     Mouth: Mucous membranes are moist.     Pharynx: Oropharynx is clear. Uvula midline. No pharyngeal swelling, oropharyngeal exudate, posterior oropharyngeal erythema, uvula swelling or postnasal drip.  Cardiovascular:     Rate and Rhythm: Normal rate and regular rhythm.     Pulses: Normal pulses.          Radial pulses are 2+ on the right side and 2+ on the left side.     Heart sounds: Normal heart sounds. No murmur heard.    No friction rub. No gallop.  Pulmonary:     Effort: Pulmonary effort is normal.     Breath sounds: Normal breath sounds. No decreased air movement. No decreased breath sounds, wheezing, rhonchi or  rales.  Musculoskeletal:     Cervical back: Normal range of motion and neck supple.  Lymphadenopathy:     Head:     Right side of head: No submental, submandibular or preauricular adenopathy.     Left side of head: No submental, submandibular or preauricular adenopathy.     Cervical:     Right cervical: No superficial cervical adenopathy.    Left cervical: No superficial cervical adenopathy.     Upper Body:     Right upper body: No supraclavicular adenopathy.     Left upper body: No supraclavicular adenopathy.  Neurological:     Mental Status: She is alert and oriented to person, place, and time.  Psychiatric:        Behavior: Behavior is cooperative.      UC Treatments / Results  Labs (all labs ordered are listed, but only abnormal results are displayed) Labs Reviewed  POCT RAPID STREP A (OFFICE)  POC COVID19/FLU A&B COMBO    EKG   Radiology No results found.  Procedures Procedures (including critical care time)  Medications Ordered in UC Medications - No data to display  Initial Impression / Assessment and Plan / UC Course  I have reviewed the triage vital signs and the nursing notes.  Pertinent labs & imaging results that were available during my care of the patient were reviewed by me and considered in my medical decision making (see chart for details).      Final Clinical Impressions(s) / UC Diagnoses   Final diagnoses:  Acute upper respiratory infection   Visit with patient indicates symptoms comprised of nasal congestion, sore throat, chills, body aches, fatigue for the past 7 days  congruent with acute URI that is likely viral in nature  Patient has tested negative for COVID, Flu and Strep today in clinic  Due to nature and duration of symptoms recommended treatment regimen is symptomatic relief and follow up if needed Discussed with patient the various viral and bacterial etiologies of current illness and appropriate course of treatment Discussed OTC  medication options for multisymptom relief such as Dayquil/Nyquil, Theraflu, AlkaSeltzer, etc. ED and return precautions reviewed and provided in AVS. Follow up as needed for persistent or progressing symptoms       Discharge Instructions      Based on your described symptoms and the duration of symptoms it is likely that you have a viral upper respiratory infection (often called a cold)  Symptoms can last for 3-10 days with lingering cough and intermittent  symptoms lasting weeks after that.  The goal of treatment at this time is to reduce your symptoms and discomfort   I recommend using Robitussin and Mucinex  (regular formulations, nothing with decongestants or DM)  You can also use Tylenol  for body aches and fever reduction I also recommend adding an antihistamine to your daily regimen This includes medications like Claritin, Allegra, Zyrtec- the generics of these work very well and are usually less expensive I recommend using Flonase  nasal spray - 2 puffs twice per day to help with your nasal congestion The antihistamines and Flonase  can take a few weeks to provide significant relief from allergy symptoms but should start to provide some benefit soon. You can use a humidifier at night to help with preventing nasal dryness and irritation   If your symptoms are not improving or seem to be getting worse over the next 5 to 7 days you can always return here to urgent care or you can follow-up with your primary care provider for ongoing management Go to the ER if you begin to have more serious symptoms such as shortness of breath, trouble breathing, loss of consciousness, swelling around the eyes, high fever, severe lasting headaches, vision changes or neck pain/stiffness.       ED Prescriptions   None    PDMP not reviewed this encounter.   Aloni Chuang, Rocky BRAVO, PA-C 01/27/24 1404

## 2024-01-27 NOTE — Discharge Instructions (Addendum)
 Based on your described symptoms and the duration of symptoms it is likely that you have a viral upper respiratory infection (often called a "cold")  Symptoms can last for 3-10 days with lingering cough and intermittent symptoms lasting weeks after that.  The goal of treatment at this time is to reduce your symptoms and discomfort   I recommend using Robitussin and Mucinex (regular formulations, nothing with decongestants or DM)  You can also use Tylenol for body aches and fever reduction I also recommend adding an antihistamine to your daily regimen This includes medications like Claritin, Allegra, Zyrtec- the generics of these work very well and are usually less expensive I recommend using Flonase nasal spray - 2 puffs twice per day to help with your nasal congestion The antihistamines and Flonase can take a few weeks to provide significant relief from allergy symptoms but should start to provide some benefit soon. You can use a humidifier at night to help with preventing nasal dryness and irritation   If your symptoms are not improving or seem to be getting worse over the next 5 to 7 days you can always return here to urgent care or you can follow-up with your primary care provider for ongoing management Go to the ER if you begin to have more serious symptoms such as shortness of breath, trouble breathing, loss of consciousness, swelling around the eyes, high fever, severe lasting headaches, vision changes or neck pain/stiffness.

## 2024-02-24 ENCOUNTER — Telehealth: Payer: Self-pay

## 2024-02-24 NOTE — Telephone Encounter (Signed)
-----   Message from [facility] Sonny ORN sent at 10/27/2023 11:38 AM EDT ----- Call Mr. And Mrs. Rochefort to schedule f/u in October

## 2024-02-24 NOTE — Telephone Encounter (Signed)
 Lm on vm that it is time to schedule October appointments for patient and her husband

## 2024-02-27 NOTE — Telephone Encounter (Signed)
 Office visits scheduled for patient and her husband, Lawrie Tunks

## 2024-03-24 ENCOUNTER — Ambulatory Visit: Admitting: Internal Medicine

## 2024-04-05 ENCOUNTER — Other Ambulatory Visit: Payer: Self-pay | Admitting: Internal Medicine

## 2024-04-06 ENCOUNTER — Telehealth: Payer: Self-pay | Admitting: Family Medicine

## 2024-04-06 NOTE — Telephone Encounter (Signed)
 Patient rescheduled appointment on 12/06/24 at 2pm

## 2024-04-06 NOTE — Telephone Encounter (Signed)
 LVM and sent mychart msg informing pt of need to reschedule 08/25/24 appt - NP schedule change

## 2024-04-07 ENCOUNTER — Telehealth: Payer: Self-pay | Admitting: Family Medicine

## 2024-04-07 NOTE — Telephone Encounter (Signed)
 Please run for repeat L knee GEL, last done 2023. Medicare/Tricare, appt 04/13/2024.

## 2024-04-08 NOTE — Telephone Encounter (Signed)
 Ran benefits for left knee Orthovisc Case [id number]

## 2024-04-08 NOTE — Telephone Encounter (Signed)
 Can you schedule patient when medication is stocked   Orthovisc authorized for left knee Medicare PRE CERT NOT REQUIRED  Deductible $257 has met $257 Patient responsible for 20% coinsurance Reference number [id number] 04/08/2024   Tricare NO PRE CERT REQUIRED  Drug and procedure covered at 100% Reference number [id number]

## 2024-04-08 NOTE — Telephone Encounter (Signed)
Scheduled 10/7

## 2024-04-12 DIAGNOSIS — R1032 Left lower quadrant pain: Secondary | ICD-10-CM | POA: Diagnosis not present

## 2024-04-12 NOTE — Progress Notes (Unsigned)
   LILLETTE Ileana Collet, PhD, LAT, ATC acting as a scribe for Artist Lloyd, MD.  Jamie [name] is a 78 y.o. female who presents to [facility] today for exacerbation of her R knee pain. Pt was last seen for her L knee on 12/31/21 and was given a steroid injection.   Pt completed Orthovisc series, L knee, 3/3, on 12/05/21  Today, pt reports both knees are painful, R>L. She notes her husband recently had a TKR and he fell and she had to strain to help him up. Pt locates pain to across her low back, R lateral hip, and the R knee.  Dx imaging: 10/04/21 L knee XR   Pertinent review of systems: No fevers or chills  Relevant historical information: History of TIA.  Caregiver burden.  Has been recently had a knee replacement and then a quad tear.   Exam:  BP 116/82   Pulse 66   Ht 5' 2.5 (1.588 m)   Wt 204 lb (92.5 kg)   SpO2 94%   BMI 36.72 kg/m  General: Well Developed, well nourished, and in no acute distress.   MSK: Knees bilaterally mild effusion normal motion with crepitation.  Right hip normal-appearing tender palpation greater trochanter.  Decreased range of motion.  Lab and Radiology Results  X-ray images right knee and right hip obtained today personally and independently interpreted.  Right hip: No acute fractures minimal DJD.  Right knee: Mild DJD.  No acute fractures.  Await formal radiology review     Assessment and Plan: 78 y.o. female with bilateral knee pain due to exacerbation of DJD.  Plan to authorize hyaluronic acid injections to proceed with those in the near future.  Right hip pain continue home exercise program.  Consider physical therapy.  For now we will prescribe hydrocodone  for pain.  She does have an allergy to morphine  but notes that she has taken hydrocodone  in the past without allergic reaction.   PDMP reviewed during this encounter. Orders Placed This Encounter  Procedures   DG Knee AP/LAT W/Sunrise Right     Standing Status:   Future    Number of Occurrences:   1    Expiration Date:   05/14/2024    Reason for Exam (SYMPTOM  OR DIAGNOSIS REQUIRED):   bilateral knee pain    Preferred imaging location?:   [facility]   DG HIP UNILAT W OR W/O PELVIS 2-3 VIEWS RIGHT    Standing Status:   Future    Number of Occurrences:   1    Expiration Date:   05/14/2024    Reason for Exam (SYMPTOM  OR DIAGNOSIS REQUIRED):   right hip pain    Preferred imaging location?:    [facility]   Meds ordered this encounter  Medications   HYDROcodone -acetaminophen  (NORCO/VICODIN) 5-325 MG tablet    Sig: Take 1 tablet by mouth every 6 (six) hours as needed.    Dispense:  15 tablet    Refill:  0     Discussed warning signs or symptoms. Please see discharge instructions. Patient expresses understanding.   The above documentation has been reviewed and is accurate and complete Artist Lloyd, M.D.

## 2024-04-12 NOTE — Telephone Encounter (Signed)
 Patient is currently experiencing diverticulitis pain. Please advise.

## 2024-04-13 ENCOUNTER — Ambulatory Visit

## 2024-04-13 ENCOUNTER — Telehealth: Payer: Self-pay | Admitting: Internal Medicine

## 2024-04-13 ENCOUNTER — Telehealth: Payer: Self-pay | Admitting: Family Medicine

## 2024-04-13 ENCOUNTER — Ambulatory Visit (INDEPENDENT_AMBULATORY_CARE_PROVIDER_SITE_OTHER): Admitting: Family Medicine

## 2024-04-13 ENCOUNTER — Encounter: Payer: Self-pay | Admitting: Family Medicine

## 2024-04-13 VITALS — BP 116/82 | HR 66 | Ht 62.5 in | Wt 204.0 lb

## 2024-04-13 DIAGNOSIS — M25562 Pain in left knee: Secondary | ICD-10-CM | POA: Diagnosis not present

## 2024-04-13 DIAGNOSIS — M25551 Pain in right hip: Secondary | ICD-10-CM | POA: Diagnosis not present

## 2024-04-13 DIAGNOSIS — M170 Bilateral primary osteoarthritis of knee: Secondary | ICD-10-CM

## 2024-04-13 DIAGNOSIS — M25561 Pain in right knee: Secondary | ICD-10-CM | POA: Diagnosis not present

## 2024-04-13 DIAGNOSIS — G8929 Other chronic pain: Secondary | ICD-10-CM | POA: Diagnosis not present

## 2024-04-13 DIAGNOSIS — M1711 Unilateral primary osteoarthritis, right knee: Secondary | ICD-10-CM | POA: Diagnosis not present

## 2024-04-13 DIAGNOSIS — M160 Bilateral primary osteoarthritis of hip: Secondary | ICD-10-CM | POA: Diagnosis not present

## 2024-04-13 MED ORDER — HYDROCODONE-ACETAMINOPHEN 5-325 MG PO TABS: 1.0000 | ORAL_TABLET | Freq: Four times a day (QID) | ORAL | 0 refills | Status: DC | PRN

## 2024-04-13 NOTE — Telephone Encounter (Signed)
 Informed patient it was sent to [facility]. She stated she spoke with them but it sounded like they had received something before her appt. with Dr. Joane today so she did not know if they had it. She stated she would call us  if she had trouble. FYI

## 2024-04-13 NOTE — Telephone Encounter (Signed)
 Patient called and asked if her prescription was sent to [facility] or [facility]. Please advise.

## 2024-04-13 NOTE — Telephone Encounter (Signed)
[id number] - [place], [place] - [address]

## 2024-04-13 NOTE — Telephone Encounter (Signed)
 Please also auth R knee for Orthovisc

## 2024-04-13 NOTE — Patient Instructions (Addendum)
 Thank you for coming in today.   Please get an Xray today before you leave   We will check benefits for gel shots for the Right Knee  Reschedule next week so we can inject both knees  Hydrocodone  - watch out for constipation with this medication.

## 2024-04-13 NOTE — Telephone Encounter (Signed)
 Patient is currently experiencing diverticulitis pain. Please advise.   Thank you

## 2024-04-13 NOTE — Telephone Encounter (Signed)
 Left message for pt to call back

## 2024-04-14 NOTE — Telephone Encounter (Signed)
 Ran benefits for Orthovisc right knee Case [id number]

## 2024-04-15 NOTE — Telephone Encounter (Signed)
 Contacted patient again to discuss her concerns. She laughs and states that she already went to an urgent care and got antibiotics. She states why would I need yall 3 days later after I called? I explained that we called and left a voicemail for her to return our call on 04/13/24 at 9:37 am. She states that she called several days before that. I advised that the phone call came in to us  on 04/13/24 at 8:17 am. She says you're telling me that I called at 8 am in the morning?. States that she was told it would be 48 hours before you people called me back. I explained that we attempted to reach her within an hour of her call. She says this is just not true.  Advised at any rate, I am glad that her symptoms were evaluated and she was treated. She confirms that she does not need anything additional from our office.

## 2024-04-16 NOTE — Telephone Encounter (Signed)
 Can you schedule patient when medication is stocked  Same benefits for right knee as for left knee document scanned  Reference number [id number]

## 2024-04-19 ENCOUNTER — Ambulatory Visit: Payer: Self-pay | Admitting: Family Medicine

## 2024-04-19 NOTE — Progress Notes (Signed)
Right hip x-ray shows a little bit of arthritis.

## 2024-04-19 NOTE — Progress Notes (Signed)
Right knee x-ray shows some arthritis.

## 2024-04-20 NOTE — Telephone Encounter (Signed)
 Left message for patient to call back to schedule.

## 2024-04-26 ENCOUNTER — Ambulatory Visit: Payer: Medicare Other

## 2024-04-29 ENCOUNTER — Ambulatory Visit (INDEPENDENT_AMBULATORY_CARE_PROVIDER_SITE_OTHER)

## 2024-04-29 VITALS — BP 136/78 | HR 65 | Ht 62.5 in | Wt 207.0 lb

## 2024-04-29 DIAGNOSIS — Z0000 Encounter for general adult medical examination without abnormal findings: Secondary | ICD-10-CM | POA: Diagnosis not present

## 2024-04-29 NOTE — Progress Notes (Signed)
 Subjective:   Jamie [name] is a 78 y.o. who presents for a Medicare Wellness preventive visit.  As a reminder, Annual Wellness Visits don't include a physical exam, and some assessments may be limited, especially if this visit is performed virtually. We may recommend an in-person follow-up visit with your provider if needed.  Visit Complete: In person  Persons Participating in Visit: Patient.  AWV Questionnaire: Yes: Patient Medicare AWV questionnaire was completed by the patient on 04/29/2024; I have confirmed that all information answered by patient is correct and no changes since this date.  Cardiac Risk Factors include: advanced age (>34men, >98 women);hypertension;Other (see comment), Risk factor comments: TIA, OSA, Fatty liver     Objective:    Today's Vitals   04/29/24 0547 04/29/24 1358  BP:  136/78  Pulse:  65  SpO2:  96%  Weight:  207 lb (93.9 kg)  Height:  5' 2.5 (1.588 m)  PainSc: 6     Body mass index is 37.26 kg/m.     04/29/2024    2:02 PM 10/08/2023    1:03 PM 08/08/2023    7:25 AM 08/05/2023    1:53 PM 06/26/2023    2:20 PM 04/24/2023    2:06 PM 04/02/2022    3:42 PM  Advanced Directives  Does Patient Have a Medical Advance Directive? Yes Yes Yes Yes Yes Yes Yes  Type of Estate agent of [place];Living will Healthcare Power of [place];Living will Healthcare Power of [place];Living will Healthcare Power of [place];Living will Healthcare Power of [place];Living will Healthcare Power of [place];Living will Healthcare Power of [place];Living will  Does patient want to make changes to medical advance directive?  No - Patient declined No - Patient declined    No - Patient declined  Copy of Healthcare Power of Attorney in Chart? No - copy requested No - copy requested No - copy requested   No - copy requested No - copy requested    Current Medications (verified) Outpatient Encounter Medications as of 04/29/2024  Medication Sig    aspirin  EC 81 MG tablet Take 1 tablet (81 mg total) by mouth daily.   clidinium-chlordiazePOXIDE  (LIBRAX ) 5-2.5 MG capsule TAKE 1 CAPSULE TWICE A DAY (OFFICE VISIT FOR FURTHER REFILLS)   clonazePAM  (KLONOPIN ) 0.5 MG tablet Take 1 tablet (0.5 mg total) by mouth at bedtime.   Collagen-Vitamin C-Biotin (COLLAGEN PO) Take 1 tablet by mouth daily.   esomeprazole  (NEXIUM ) 40 MG capsule TAKE 1 CAPSULE TWICE A DAY BEFORE MEALS   famotidine  (PEPCID ) 20 MG tablet Take 1 tablet (20 mg total) by mouth 2 (two) times daily.   linaclotide  (LINZESS ) 290 MCG CAPS capsule TAKE 1 CAPSULE DAILY BEFORE BREAKFAST   meloxicam  (MOBIC ) 15 MG tablet Take 1 tablet (15 mg total) by mouth daily.   metaxalone  (SKELAXIN ) 400 MG tablet Take 1 tablet (400 mg total) by mouth at bedtime.   metFORMIN  (GLUCOPHAGE ) 500 MG tablet Take 1 tablet (500 mg total) by mouth daily with breakfast.   Multiple Vitamins-Minerals (CENTRUM SILVER 50+WOMEN) TABS Take 1 tablet by mouth daily.   Omega-3-6-9 CAPS Take 1 capsule by mouth every evening.   Probiotic Product (PROBIOTIC-10 PO) Take 1 capsule by mouth 2 (two) times daily.    Rimegepant Sulfate (NURTEC) 75 MG TBDP Take 75 mg by mouth daily as needed (take for abortive therapy of migraine, no more than 1 tablet in 24 hours or 8 per month).   RSV vaccine recomb adjuvanted (AREXVY ) 120 MCG/0.5ML injection Inject into  the muscle.   spironolactone  (ALDACTONE ) 50 MG tablet Take 1 tablet (50 mg total) by mouth daily.   TURMERIC PO Take 1 tablet by mouth daily.   HYDROcodone -acetaminophen  (NORCO/VICODIN) 5-325 MG tablet Take 1 tablet by mouth every 6 (six) hours as needed. (Patient not taking: Reported on 04/29/2024)   No facility-administered encounter medications on file as of 04/29/2024.    Allergies (verified) Buprenorphine hcl, Morphine  and codeine , Morphine  and codeine , Aspirin , Avelox [moxifloxacin hcl in nacl], Diflucan [fluconazole], Diflucan [fluconazole], Hydrochlorothiazide, Latex, and  Moxifloxacin   History: Past Medical History:  Diagnosis Date   Allergic rhinitis    Anxiety    Arthritis    left foot   Asthma    Back pain    Blood transfusion without reported diagnosis    Colon polyps    adenomatous   Constipation    Diverticulosis    Dysrhythmia 1999   Dr Delford 2003   Elevated LFTs    Fatty liver disease, nonalcoholic    GERD (gastroesophageal reflux disease)    Headache(784.0)    migraines with vision changes but no pain   Heart murmur    Hypertension    IBS (irritable bowel syndrome)    Joint pain    Migraines    Muscle cramps    Neuromuscular disorder (HCC)    RLS   Obesity    Palpitations    Pneumonia 2007   Renal cell carcinoma (HCC) 2013   minimal partial nephrectomy   Restless leg syndrome    Sleep apnea    moderate; wears CPAP, uncertain about setting   Stroke [facility])    TIA (transient ischemic attack) 09/11/2014   Ulcer    Urinary incontinence    Vitamin D  deficiency    Past Surgical History:  Procedure Laterality Date   BREAST REDUCTION SURGERY  Jan 1991   CHOLECYSTECTOMY N/A 08/08/2023   Procedure: LAPAROSCOPIC CHOLECYSTECTOMY;  Surgeon: Polly Cordella LABOR, MD;  Location: [facility];  Service: General;  Laterality: N/A;   ELBOW SURGERY Left    KIDNEY SURGERY Right October 2013    Tumor removed    LAPAROSCOPIC APPENDECTOMY N/A 06/08/2018   Procedure: APPENDECTOMY LAPAROSCOPIC;  Surgeon: Curvin Deward MOULD, MD;  Location: [facility];  Service: General;  Laterality: N/A;   TONSILLECTOMY     VAGINAL HYSTERECTOMY  1979   partial   Family History  Problem Relation Age of Onset   Hypertension Mother    Anxiety disorder Mother    Heart disease Father 86   Lung cancer Father        smoker   Lung cancer Sister    Heart disease Brother    Allergies Brother    Allergies Daughter    Melanoma Daughter    Colon cancer Cousin    Hypertension Other    Liver disease Neg Hx    Asthma Neg Hx    Esophageal cancer Neg Hx    Rectal cancer Neg Hx     Stomach cancer Neg Hx    Liver cancer Neg Hx    Prostate cancer Neg Hx    Pancreatic cancer Neg Hx    Social History   Socioeconomic History   Marital status: Married    Spouse name: Marty Ned   Number of children: 2   Years of education: 15   Highest education level: Associate degree: academic program  Occupational History   Occupation: Retired    Associate Professor: OTHER    Comment: real estate agent  Tobacco Use  Smoking status: Never   Smokeless tobacco: Never  Vaping Use   Vaping status: Never Used  Substance and Sexual Activity   Alcohol use: Yes    Alcohol/week: 2.0 - 3.0 standard drinks of alcohol    Types: 1 - 2 Glasses of wine, 1 Shots of liquor per week    Comment: occassionally   Drug use: No   Sexual activity: Yes    Birth control/protection: Surgical  Other Topics Concern   Not on file  Social History Narrative   Married [name]) and lives with her spouse./2025   Retired Audiological scientist estate.   Education- college   Patient is right handed.   Patient drinks tea a lot and water .   Social Drivers of Corporate investment banker Strain: Low Risk  (04/29/2024)   Overall Financial Resource Strain (CARDIA)    Difficulty of Paying Living Expenses: Not hard at all  Food Insecurity: No Food Insecurity (04/29/2024)   Hunger Vital Sign    Worried About Running Out of Food in the Last Year: Never true    Ran Out of Food in the Last Year: Never true  Transportation Needs: No Transportation Needs (04/29/2024)   PRAPARE - Administrator, Civil Service (Medical): No    Lack of Transportation (Non-Medical): No  Physical Activity: Unknown (04/29/2024)   Exercise Vital Sign    Days of Exercise per Week: Patient declined    Minutes of Exercise per Session: Not on file  Stress: Stress Concern Present (04/29/2024)   [employer] of Occupational Health - Occupational Stress Questionnaire    Feeling of Stress: To some extent  Social Connections: Moderately  Integrated (04/29/2024)   Social Connection and Isolation Panel    Frequency of Communication with Friends and Family: More than three times a week    Frequency of Social Gatherings with Friends and Family: Once a week    Attends Religious Services: Patient declined    Database administrator or Organizations: Yes    Attends Engineer, structural: More than 4 times per year    Marital Status: Married    Tobacco Counseling Counseling given: Not Answered    Clinical Intake:  Pre-visit preparation completed: Yes  Pain : 0-10 Pain Score: 6  Pain Type: Chronic pain Pain Location: Back Pain Descriptors / Indicators: Aching, Discomfort Pain Onset: More than a month ago Pain Frequency: Constant Pain Relieving Factors: Tylenol   Pain Relieving Factors: Tylenol   BMI - recorded: 37.26 Nutritional Status: BMI > 30  Obese Nutritional Risks: None Diabetes: No  Lab Results  Component Value Date   HGBA1C 5.3 05/05/2023   HGBA1C 5.4 06/25/2022   HGBA1C 5.0 10/18/2019     How often do you need to have someone help you when you read instructions, pamphlets, or other written materials from your doctor or pharmacy?: 1 - Never  Interpreter Needed?: No  Information entered by :: Laikynn Pollio, RMA   Activities of Daily Living     04/29/2024    5:47 AM 08/05/2023    1:56 PM  In your present state of health, do you have any difficulty performing the following activities:  Hearing? 0   Vision? 0   Difficulty concentrating or making decisions? 0   Walking or climbing stairs? 1   Dressing or bathing? 0   Doing errands, shopping? 0 0  Preparing Food and eating ? N   Using the Toilet? N   In the past six months, have you accidently  leaked urine? Y   Do you have problems with loss of bowel control? N   Managing your Medications? N   Managing your Finances? N   Housekeeping or managing your Housekeeping? Y     Patient Care Team: Rollene Almarie LABOR, MD as PCP - General  (Internal Medicine)  I have updated your Care Teams any recent Medical Services you may have received from other providers in the past year.     Assessment:   This is a routine wellness examination for Latrisha.  Hearing/Vision screen Hearing Screening - Comments:: Denies hearing difficulties   Vision Screening - Comments:: Wears eyeglasses/   Goals Addressed             This Visit's Progress    Client understands the importance of follow-up with providers by attending scheduled visits   On track      Depression Screen     04/29/2024    2:05 PM 04/24/2023    2:08 PM 06/25/2022    1:01 PM 04/02/2022    3:39 PM 12/24/2021    1:45 PM 09/18/2021    2:13 PM 08/25/2017    9:52 AM  PHQ 2/9 Scores  PHQ - 2 Score 2 0 0 0 0 0   PHQ- 9 Score 4 0 0         Information is confidential and restricted. Go to Review Flowsheets to unlock data.    Fall Risk     04/29/2024    5:47 AM 05/05/2023    2:07 PM 05/05/2023    1:59 PM 04/24/2023    2:07 PM 06/25/2022    1:01 PM  Fall Risk   Falls in the past year? 0 0 0 0 0  Number falls in past yr: 0 0 0 0 0  Injury with Fall? 0 0 0 0 0  Risk for fall due to :    No Fall Risks   Follow up Falls evaluation completed;Falls prevention discussed Falls evaluation completed Falls evaluation completed Falls prevention discussed Falls evaluation completed      Data saved with a previous flowsheet row definition    MEDICARE RISK AT HOME:  Medicare Risk at Home Any stairs in or around the home?: (Patient-Rptd) Yes If so, are there any without handrails?: (Patient-Rptd) No Home free of loose throw rugs in walkways, pet beds, electrical cords, etc?: (Patient-Rptd) No Adequate lighting in your home to reduce risk of falls?: (Patient-Rptd) Yes Life alert?: (Patient-Rptd) No Use of a cane, walker or w/c?: (Patient-Rptd) No Grab bars in the bathroom?: (Patient-Rptd) No Shower chair or bench in shower?: (Patient-Rptd) Yes Elevated toilet seat or a  handicapped toilet?: (Patient-Rptd) No  TIMED UP AND GO:  Was the test performed?  Yes  Length of time to ambulate 10 feet: 20 sec Gait slow and steady without use of assistive device  Cognitive Function: 6CIT completed    04/24/2023    2:08 PM  MMSE - Mini Mental State Exam  Not completed: Unable to complete        04/29/2024    2:04 PM 04/24/2023    2:08 PM 04/02/2022    3:44 PM  6CIT Screen  What Year? 0 points 0 points 0 points  What month? 0 points 0 points 0 points  What time? 0 points 0 points 0 points  Count back from 20 0 points 0 points 0 points  Months in reverse 0 points 0 points 0 points  Repeat phrase 2 points 0 points 0  points  Total Score 2 points 0 points 0 points    Immunizations Immunization History  Administered Date(s) Administered   INFLUENZA, HIGH DOSE SEASONAL PF 04/27/2014, 04/21/2015   Influenza Split 04/07/2012   Influenza Whole 04/01/2007, 04/22/2008, 05/04/2010, 03/14/2011   Influenza, Quadrivalent, Recombinant, Inj, Pf 03/20/2018, 03/26/2019   Influenza-Unspecified 03/31/2012, 04/23/2013, 04/14/2021, 04/14/2023   PFIZER(Purple Top)SARS-COV-2 Vaccination 08/16/2019, 09/08/2019, 04/03/2020   Pneumococcal Conjugate-13 11/16/2013   Pneumococcal Polysaccharide-23 01/01/2006, 04/01/2007, 10/25/2021   Respiratory Syncytial Virus Vaccine ,Recomb Aduvanted(Arexvy ) 05/16/2023   Td 09/25/1998, 02/21/2010   Tdap 05/19/2013   Unspecified SARS-COV-2 Vaccination 04/14/2023   Zoster Recombinant(Shingrix) 02/08/2021   Zoster, Live 05/07/2011    Screening Tests Health Maintenance  Topic Date Due   Hepatitis C Screening  Never done   Zoster Vaccines- Shingrix (2 of 2) 04/05/2021   Influenza Vaccine  02/06/2024   COVID-19 Vaccine (5 - 2025-26 season) 03/08/2024   DTaP/Tdap/Td (4 - Td or Tdap) 01/06/2025 (Originally 05/20/2023)   Medicare Annual Wellness (AWV)  04/29/2025   Colonoscopy  12/26/2026   Pneumococcal Vaccine: 50+ Years  Completed   DEXA  SCAN  Completed   Meningococcal B Vaccine  Aged Out   Mammogram  Discontinued    Health Maintenance Items Addressed: Vaccines Due: 2nd shingrix, See Nurse Notes at the end of this note  Additional Screening:  Vision Screening: Recommended annual ophthalmology exams for early detection of glaucoma and other disorders of the eye. Is the patient up to date with their annual eye exam?  No  Who is the provider or what is the name of the office in which the patient attends annual eye exams? Dr. Whitney Patient not up to date.  Dental Screening: Recommended annual dental exams for proper oral hygiene  Community Resource Referral / Chronic Care Management: CRR required this visit?  No   CCM required this visit?  No   Plan:    I have personally reviewed and noted the following in the patient's chart:   Medical and social history Use of alcohol, tobacco or illicit drugs  Current medications and supplements including opioid prescriptions. Patient is not currently taking opioid prescriptions. Functional ability and status Nutritional status Physical activity Advanced directives List of other physicians Hospitalizations, surgeries, and ER visits in previous 12 months Vitals Screenings to include cognitive, depression, and falls Referrals and appointments  In addition, I have reviewed and discussed with patient certain preventive protocols, quality metrics, and best practice recommendations. A written personalized care plan for preventive services as well as general preventive health recommendations were provided to patient.   Macari Zalesky L Joleigh Mineau, CMA   04/29/2024   After Visit Summary: (MyChart) Due to this being a telephonic visit, the after visit summary with patients personalized plan was offered to patient via MyChart   Notes: Patient is due for a 2nd shingrix vaccine.  Patient stated that she would call office to schedule a yearly with Dr. Rollene.

## 2024-04-29 NOTE — Patient Instructions (Addendum)
 Ms. Debose,  Thank you for taking the time for your Medicare Wellness Visit. I appreciate your continued commitment to your health goals. Please review the care plan we discussed, and feel free to reach out if I can assist you further.  Medicare recommends these wellness visits once per year to help you and your care team stay ahead of potential health issues. These visits are designed to focus on prevention, allowing your provider to concentrate on managing your acute and chronic conditions during your regular appointments.  Please note that Annual Wellness Visits do not include a physical exam. Some assessments may be limited, especially if the visit was conducted virtually. If needed, we may recommend a separate in-person follow-up with your provider.  Ongoing Care Seeing your primary care provider every 3 to 6 months helps us  monitor your health and provide consistent, personalized care. Last office visit on 05/05/2023.  You are due for a 2nd Shingles vaccine and can get that done at your local pharmacy.  Remember to call the office to schedule a yearly visit with Dr. Rollene.  Referrals If a referral was made during today's visit and you haven't received any updates within two weeks, please contact the referred provider directly to check on the status.  Recommended Screenings:  Health Maintenance  Topic Date Due   Hepatitis C Screening  Never done   Zoster (Shingles) Vaccine (2 of 2) 04/05/2021   Flu Shot  02/06/2024   COVID-19 Vaccine (5 - 2025-26 season) 03/08/2024   DTaP/Tdap/Td vaccine (4 - Td or Tdap) 01/06/2025*   Medicare Annual Wellness Visit  04/29/2025   Colon Cancer Screening  12/26/2026   Pneumococcal Vaccine for age over 11  Completed   DEXA scan (bone density measurement)  Completed   Meningitis B Vaccine  Aged Out   Breast Cancer Screening  Discontinued  *Topic was postponed. The date shown is not the original due date.       10/08/2023    1:03 PM  Advanced  Directives  Does Patient Have a Medical Advance Directive? Yes  Type of Estate agent of [place];Living will  Does patient want to make changes to medical advance directive? No - Patient declined  Copy of Healthcare Power of Attorney in Chart? No - copy requested   Advance Care Planning is important because it: Ensures you receive medical care that aligns with your values, goals, and preferences. Provides guidance to your family and loved ones, reducing the emotional burden of decision-making during critical moments.  Vision: Annual vision screenings are recommended for early detection of glaucoma, cataracts, and diabetic retinopathy. These exams can also reveal signs of chronic conditions such as diabetes and high blood pressure.  Dental: Annual dental screenings help detect early signs of oral cancer, gum disease, and other conditions linked to overall health, including heart disease and diabetes.  Please see the attached documents for additional preventive care recommendations.

## 2024-05-06 ENCOUNTER — Other Ambulatory Visit: Payer: Self-pay

## 2024-05-06 ENCOUNTER — Ambulatory Visit: Admitting: Family Medicine

## 2024-05-06 DIAGNOSIS — M170 Bilateral primary osteoarthritis of knee: Secondary | ICD-10-CM | POA: Diagnosis not present

## 2024-05-06 MED ORDER — HYALURONAN 30 MG/2ML IX SOSY
30.0000 mg | PREFILLED_SYRINGE | Freq: Once | INTRA_ARTICULAR | Status: AC
  Administered 2024-05-06: 30 mg via INTRA_ARTICULAR

## 2024-05-06 NOTE — Progress Notes (Signed)
 Yalissa D Chiarelli presents to clinic today for Orthovisc injection bilateral knee 1/3 Procedure: Real-time Ultrasound Guided Injection of right knee joint superior lateral patellar space Device: Philips Affiniti 50G/GE Logiq Images permanently stored and available for review in PACS Verbal informed consent obtained.  Discussed risks and benefits of procedure. Warned about infection, bleeding, damage to structures among others. Patient expresses understanding and agreement Time-out conducted.   Noted no overlying erythema, induration, or other signs of local infection.   Skin prepped in a sterile fashion.   Local anesthesia: Topical Ethyl chloride.   With sterile technique and under real time ultrasound guidance: Orthovisc 30 mg injected into knee joint. Fluid seen entering the joint capsule.   Completed without difficulty   Advised to call if fevers/chills, erythema, induration, drainage, or persistent bleeding.   Images permanently stored and available for review in the ultrasound unit.  Impression: Technically successful ultrasound guided injection. Lot number: [id number]   Procedure: Real-time Ultrasound Guided Injection of left knee joint superior lateral patellar space Device: Philips Affiniti 50G/GE Logiq Images permanently stored and available for review in PACS Verbal informed consent obtained.  Discussed risks and benefits of procedure. Warned about infection, bleeding, damage to structures among others. Patient expresses understanding and agreement Time-out conducted.   Noted no overlying erythema, induration, or other signs of local infection.   Skin prepped in a sterile fashion.   Local anesthesia: Topical Ethyl chloride.   With sterile technique and under real time ultrasound guidance: Orthovisc 30 mg injected into knee joint. Fluid seen entering the joint capsule.   Completed without difficulty   Advised to call if fevers/chills, erythema, induration, drainage, or persistent  bleeding.   Images permanently stored and available for review in the ultrasound unit.  Impression: Technically successful ultrasound guided injection. Lot number: [id number]  Return in 1 week for Orthovisc injection bilateral knees 2/3  Additionally patient has some left Achilles tendinitis.  Home exercise program reviewed will spend more time on this issue next visit in 1 week

## 2024-05-06 NOTE — Patient Instructions (Addendum)
 Thank you for coming in today.   You received an injection today. Seek immediate medical attention if the joint becomes red, extremely painful, or is oozing fluid.   Schedule the 2nd gel shot for next week and the 3rd for the week after that.  View at [email or web address] code [facility]

## 2024-05-11 ENCOUNTER — Encounter: Payer: Self-pay | Admitting: Internal Medicine

## 2024-05-11 ENCOUNTER — Ambulatory Visit: Admitting: Internal Medicine

## 2024-05-11 VITALS — BP 134/76 | HR 71 | Ht 62.0 in | Wt 204.0 lb

## 2024-05-11 DIAGNOSIS — K219 Gastro-esophageal reflux disease without esophagitis: Secondary | ICD-10-CM

## 2024-05-11 DIAGNOSIS — R1032 Left lower quadrant pain: Secondary | ICD-10-CM

## 2024-05-11 DIAGNOSIS — K589 Irritable bowel syndrome without diarrhea: Secondary | ICD-10-CM | POA: Diagnosis not present

## 2024-05-11 DIAGNOSIS — Z860100 Personal history of colon polyps, unspecified: Secondary | ICD-10-CM | POA: Diagnosis not present

## 2024-05-11 MED ORDER — FAMOTIDINE 20 MG PO TABS: 20.0000 mg | ORAL_TABLET | Freq: Two times a day (BID) | ORAL | 3 refills | Status: AC

## 2024-05-11 MED ORDER — CILIDINIUM-CHLORDIAZEPOXIDE 2.5-5 MG PO CAPS: 1.0000 | ORAL_CAPSULE | Freq: Two times a day (BID) | ORAL | 0 refills | Status: AC

## 2024-05-11 MED ORDER — LINACLOTIDE 290 MCG PO CAPS: ORAL_CAPSULE | ORAL | 3 refills | Status: AC

## 2024-05-11 MED ORDER — ESOMEPRAZOLE MAGNESIUM 40 MG PO CPDR: DELAYED_RELEASE_CAPSULE | ORAL | 3 refills | Status: AC

## 2024-05-11 NOTE — Progress Notes (Signed)
 HISTORY OF PRESENT ILLNESS:  Jamie [name] is a 78 y.o. female with a history of diverticulitis, adenomatous colon polyps, GERD, and constipation predominant irritable bowel syndrome.  She was last evaluated in the office October 03, 2022.  At that time she was doing well with regards to her reflux disease and irritable bowel syndrome.  See that dictation.  Medication refills and follow-up in 1 year requested.  Patient reports that she had cholecystectomy January 2025 when she was complaining of pain between her shoulder blades.  Imaging revealed gallstones.  She tells me that she was slow to recover with regards to her gallbladder surgery experiencing postoperative pain.  Fortunately, things dissipated with time.  She continues to have some ongoing musculoskeletal type discomfort in her back.  In terms of reflux, she continues on Nexium  and famotidine .  This works.  She does request medication refill.  She continues to use Librax  for IBS and Linzess  for constipation.  She also request refills.  Last complete colonoscopy was performed June 2021.  She was found to have diverticulosis and adenomatous colon polyp.  Due to history of multiple adenomas, follow-up in 5 years recommended.  No active lower GI complaints other than intermittent left lower quadrant/inguinal discomfort.  Blood work in January 2025 shows a hemoglobin of 12.9..   REVIEW OF SYSTEMS:  All non-GI ROS negative except for allergies, anxiety, arthritis, back pain, heart murmur, menstrual cramps, sleeping problems, urinary leakage, shortness of breath  Past Medical History:  Diagnosis Date   Allergic rhinitis    Anxiety    Arthritis    left foot   Asthma    Back pain    Blood transfusion without reported diagnosis    Colon polyps    adenomatous   Constipation    Diverticulosis    Dysrhythmia 1999   Dr Delford 2003   Elevated LFTs    Fatty liver disease, nonalcoholic    GERD (gastroesophageal reflux disease)     Headache(784.0)    migraines with vision changes but no pain   Heart murmur    Hypertension    IBS (irritable bowel syndrome)    Joint pain    Migraines    Muscle cramps    Neuromuscular disorder (HCC)    RLS   Obesity    Palpitations    Pneumonia 2007   Renal cell carcinoma (HCC) 2013   minimal partial nephrectomy   Restless leg syndrome    Sleep apnea    moderate; wears CPAP, uncertain about setting   Stroke [facility])    TIA (transient ischemic attack) 09/11/2014   Ulcer    Urinary incontinence    Vitamin D  deficiency     Past Surgical History:  Procedure Laterality Date   BREAST REDUCTION SURGERY  Jan 1991   CHOLECYSTECTOMY N/A 08/08/2023   Procedure: LAPAROSCOPIC CHOLECYSTECTOMY;  Surgeon: Polly Cordella LABOR, MD;  Location: [facility];  Service: General;  Laterality: N/A;   ELBOW SURGERY Left    KIDNEY SURGERY Right October 2013    Tumor removed    LAPAROSCOPIC APPENDECTOMY N/A 06/08/2018   Procedure: APPENDECTOMY LAPAROSCOPIC;  Surgeon: Curvin Deward MOULD, MD;  Location: [facility];  Service: General;  Laterality: N/A;   TONSILLECTOMY     VAGINAL HYSTERECTOMY  1979   partial    Social History Pyper D Dorminey  reports that she has never smoked. She has never used smokeless tobacco. She reports current alcohol use of about 2.0 - 3.0 standard drinks of alcohol per week.  She reports that she does not use drugs.  family history includes Allergies in her brother and daughter; Anxiety disorder in her mother; Colon cancer in her cousin; Heart disease in her brother; Heart disease (age of onset: 40) in her father; Hypertension in her mother and another family member; Lung cancer in her father and sister; Melanoma in her daughter.  Allergies  Allergen Reactions   Buprenorphine Hcl Rash and Shortness Of Breath   Morphine  And Codeine  Shortness Of Breath and Rash   Morphine  And Codeine  Shortness Of Breath   Aspirin  Other (See Comments)    REACTION: ulcers  Can take 81 mg   Avelox  [Moxifloxacin Hcl In Nacl] Hives   Diflucan [Fluconazole] Hives    Whelps and rash c skin peeling   Diflucan [Fluconazole] Hives   Hydrochlorothiazide Other (See Comments)   Latex Rash   Moxifloxacin Palpitations    Tachycardia        PHYSICAL EXAMINATION: Vital signs: BP 134/76 (BP Location: Left Arm, Patient Position: Sitting, Cuff Size: Normal)   Pulse 71   Ht 5' 2 (1.575 m)   Wt 204 lb (92.5 kg)   BMI 37.31 kg/m   Constitutional: generally well-appearing, no acute distress Psychiatric: alert and oriented x3, cooperative Eyes: extraocular movements intact, anicteric, conjunctiva pink Mouth: oral pharynx moist, no lesions Neck: supple no lymphadenopathy Cardiovascular: heart regular rate and rhythm, no murmur Lungs: clear to auscultation bilaterally Abdomen: soft, obese, nontender, nondistended, no obvious ascites, no peritoneal signs, normal bowel sounds, no organomegaly.  Mild tenderness along the left inguinal ligament Rectal: Omitted Extremities: no clubbing, cyanosis, or lower extremity edema bilaterally Skin: no lesions on visible extremities Neuro: No focal deficits. No asterixis. Cranial nerves intact   ASSESSMENT:   1.  GERD.  Symptoms controlled with Nexium  twice daily.  As well on-demand Pepcid  2.  IBS.  Response to Librax  3.  History of diverticulitis 4.  History of adenomatous colon polyps.  Surveillance up-to-date 5.  Status post cholecystectomy 6.  Left inguinal discomfort without hernia.   PLAN:   1.  Reflux precautions 2.  Refill Nexium .  Medication list reviewed 3.  Refill Pepcid .  Medication list reviewed 4.  Refill Librax .  Medication risk reviewed 5.  Surveillance colonoscopy around June 2026 6.  Routine office follow-up 1 year.  Sooner if needed. Total time of 30 minutes was spent preparing to see the patient, obtaining comprehensive interval history, performing medically appropriate physical exam, counseling and educating the patient  regarding above listed issues, refilling multiple medications, defining follow-up interval, and documenting clinical information in the health record

## 2024-05-11 NOTE — Patient Instructions (Signed)
 We have sent the following medications to your pharmacy for you to pick up at your convenience:  Nexium , Pepcid , Linzess , Librax   Please follow up in one year.  _______________________________________________________  If your blood pressure at your visit was 140/90 or greater, please contact your primary care physician to follow up on this.  _______________________________________________________  If you are age 76 or older, your body mass index should be between 23-30. Your Body mass index is 37.31 kg/m. If this is out of the aforementioned range listed, please consider follow up with your Primary Care Provider.  If you are age 41 or younger, your body mass index should be between 19-25. Your Body mass index is 37.31 kg/m. If this is out of the aformentioned range listed, please consider follow up with your Primary Care Provider.   ________________________________________________________  The [facility] providers would like to encourage you to use MYCHART to communicate with providers for non-urgent requests or questions.  Due to long hold times on the telephone, sending your provider a message by [facility] may be a faster and more efficient way to get a response.  Please allow 48 business hours for a response.  Please remember that this is for non-urgent requests.  _______________________________________________________  [facility] is using a team-based approach to care.  Your team is made up of your doctor and two to three APPS. Our APPS (Nurse Practitioners and Physician Assistants) work with your physician to ensure care continuity for you. They are fully qualified to address your health concerns and develop a treatment plan. They communicate directly with your gastroenterologist to care for you. Seeing the Advanced Practice Practitioners on your physician's team can help you by facilitating care more promptly, often allowing for earlier appointments, access to diagnostic  testing, procedures, and other specialty referrals.

## 2024-05-13 ENCOUNTER — Ambulatory Visit: Admitting: Family Medicine

## 2024-05-13 ENCOUNTER — Other Ambulatory Visit: Payer: Self-pay

## 2024-05-13 ENCOUNTER — Encounter: Payer: Self-pay | Admitting: Family Medicine

## 2024-05-13 VITALS — BP 110/78 | HR 78 | Ht 62.0 in | Wt 204.0 lb

## 2024-05-13 DIAGNOSIS — G8929 Other chronic pain: Secondary | ICD-10-CM | POA: Diagnosis not present

## 2024-05-13 DIAGNOSIS — M170 Bilateral primary osteoarthritis of knee: Secondary | ICD-10-CM | POA: Diagnosis not present

## 2024-05-13 DIAGNOSIS — M25562 Pain in left knee: Secondary | ICD-10-CM | POA: Diagnosis not present

## 2024-05-13 DIAGNOSIS — M7662 Achilles tendinitis, left leg: Secondary | ICD-10-CM | POA: Diagnosis not present

## 2024-05-13 DIAGNOSIS — M25561 Pain in right knee: Secondary | ICD-10-CM

## 2024-05-13 DIAGNOSIS — R079 Chest pain, unspecified: Secondary | ICD-10-CM | POA: Diagnosis not present

## 2024-05-13 MED ORDER — HYALURONAN 30 MG/2ML IX SOSY
30.0000 mg | PREFILLED_SYRINGE | Freq: Once | INTRA_ARTICULAR | Status: AC
  Administered 2024-05-13: 30 mg via INTRA_ARTICULAR

## 2024-05-13 NOTE — Patient Instructions (Addendum)
 Thank you for coming in today.   You received an injection today. Seek immediate medical attention if the joint becomes red, extremely painful, or is oozing fluid.   Continue home exercises for the Achilles.   EKG obtained today. Referral placed to Cardiology.   See you back in 1 week.

## 2024-05-13 NOTE — Progress Notes (Signed)
 I, Leotis Batter, CMA acting as a scribe for Artist Lloyd, MD.  Jamie [name] is a 78 y.o. female who presents to [facility] at [facility] today for #2 Orthovisc bilaterally and for evaluation of her L Achilles. Pt was last seen by Dr. Lloyd on 05/06/20 for her 1st Orthovisc injections.  Today, pt reports continued popping in the right knee, feels like it shifts at times.   Notes significant improvement of achilles sx with HEP.   She does note some intermittent chest tightness occasionally radiating to the jaw.  That is not occurring currently but has occurred in the past for several years.  Is been years since she has been seen by her cardiologist.  She is interested in referral.  Pertinent review of systems: No fevers or chills  Relevant historical information: Echocardiogram 2017. History of TIA  Exam:  BP 110/78   Pulse 78   Ht 5' 2 (1.575 m)   Wt 204 lb (92.5 kg)   SpO2 96%   BMI 37.31 kg/m  General: Well Developed, well nourished, and in no acute distress.   MSK: Bilateral knees minimal effusion normal motion with crepitation.  Heart and lungs: clear to auscultation bilaterally Normal rate and rhythm.  No murmurs rubs or gallops auscultated.   Lab and Radiology Results  Jamie [name] presents to clinic today for Orthovisc injection bilateral knee 2/3 Procedure: Real-time Ultrasound Guided Injection of right knee joint superior lateral patellar space Device: Philips Affiniti 50G/GE Logiq Images permanently stored and available for review in PACS Verbal informed consent obtained.  Discussed risks and benefits of procedure. Warned about infection, bleeding, damage to structures among others. Patient expresses understanding and agreement Time-out conducted.   Noted no overlying erythema, induration, or other signs of local infection.   Skin prepped in a sterile fashion.   Local anesthesia: Topical Ethyl chloride.   With sterile technique and  under real time ultrasound guidance: Orthovisc 30 mg injected into knee joint. Fluid seen entering the joint capsule.   Completed without difficulty   Advised to call if fevers/chills, erythema, induration, drainage, or persistent bleeding.   Images permanently stored and available for review in the ultrasound unit.  Impression: Technically successful ultrasound guided injection.  Procedure: Real-time Ultrasound Guided Injection of left knee joint superior lateral patellar space Device: Philips Affiniti 50G/GE Logiq Images permanently stored and available for review in PACS Verbal informed consent obtained.  Discussed risks and benefits of procedure. Warned about infection, bleeding, damage to structures among others. Patient expresses understanding and agreement Time-out conducted.   Noted no overlying erythema, induration, or other signs of local infection.   Skin prepped in a sterile fashion.   Local anesthesia: Topical Ethyl chloride.   With sterile technique and under real time ultrasound guidance: Orthovisc 30 mg injected into knee joint. Fluid seen entering the joint capsule.   Completed without difficulty   Advised to call if fevers/chills, erythema, induration, drainage, or persistent bleeding.   Images permanently stored and available for review in the ultrasound unit.  Impression: Technically successful ultrasound guided injection. Lot number: [id number]   Twelve-lead EKG: NSR rate 72. No ST elevation or depression.  Not significantly different than prior EKG.   Assessment and Plan: 78 y.o. female with bilateral knee pain due to DJD plan for Orthovisc injection #2 of 3.  Return in 1 week for Orthovisc injection both knees #3 next week.  Left Achilles pain has improved with home exercise program.  Patient  notes intermittent chest tightness or pain.  Fortunately she is not experiencing those symptoms currently.  I do think it would be a good idea for her to follow back up with her  cardiology team.  Will place referral to cardiology.  Today we will go ahead and check EKG prior to discharge.  Patient will be checking back next week to perform injection #3.  We can follow-up this issue again at that point.  If symptoms worsen considerably with chest pain she should go to the emergency room.   PDMP not reviewed this encounter. Orders Placed This Encounter  Procedures   US  LIMITED JOINT SPACE STRUCTURES LOW BILAT(NO LINKED CHARGES)    Reason for Exam (SYMPTOM  OR DIAGNOSIS REQUIRED):   bilat knee pain    Preferred imaging location?:   [facility]   EKG 12-Lead   Meds ordered this encounter  Medications   Hyaluronan (ORTHOVISC) intra-articular injection 30 mg   Hyaluronan (ORTHOVISC) intra-articular injection 30 mg     Discussed warning signs or symptoms. Please see discharge instructions. Patient expresses understanding.   The above documentation has been reviewed and is accurate and complete Artist Lloyd, M.D.

## 2024-05-20 ENCOUNTER — Other Ambulatory Visit: Payer: Self-pay

## 2024-05-20 ENCOUNTER — Ambulatory Visit (INDEPENDENT_AMBULATORY_CARE_PROVIDER_SITE_OTHER): Admitting: Family Medicine

## 2024-05-20 VITALS — BP 120/60 | HR 82 | Ht 62.0 in | Wt 206.0 lb

## 2024-05-20 DIAGNOSIS — M25562 Pain in left knee: Secondary | ICD-10-CM

## 2024-05-20 DIAGNOSIS — G8929 Other chronic pain: Secondary | ICD-10-CM

## 2024-05-20 DIAGNOSIS — M25561 Pain in right knee: Secondary | ICD-10-CM

## 2024-05-20 DIAGNOSIS — M170 Bilateral primary osteoarthritis of knee: Secondary | ICD-10-CM

## 2024-05-20 MED ORDER — HYALURONAN 30 MG/2ML IX SOSY
30.0000 mg | PREFILLED_SYRINGE | Freq: Once | INTRA_ARTICULAR | Status: AC
  Administered 2024-05-20: 30 mg via INTRA_ARTICULAR

## 2024-05-20 NOTE — Patient Instructions (Signed)
 Thank you for coming in today.   You received an injection today. Seek immediate medical attention if the joint becomes red, extremely painful, or is oozing fluid.

## 2024-05-20 NOTE — Progress Notes (Signed)
  Jamie [name] presents to clinic today for Orthovisc injection bilateral knee 3/3 Procedure: Real-time Ultrasound Guided Injection of right knee joint superior lateral patellar space Device: Philips Affiniti 50G/GE Logiq Images permanently stored and available for review in PACS Verbal informed consent obtained.  Discussed risks and benefits of procedure. Warned about infection, bleeding, damage to structures among others. Patient expresses understanding and agreement Time-out conducted.   Noted no overlying erythema, induration, or other signs of local infection.   Skin prepped in a sterile fashion.   Local anesthesia: Topical Ethyl chloride.   With sterile technique and under real time ultrasound guidance: Orthovisc 30 mg injected into knee joint. Fluid seen entering the joint capsule.   Completed without difficulty   Advised to call if fevers/chills, erythema, induration, drainage, or persistent bleeding.   Images permanently stored and available for review in the ultrasound unit.  Impression: Technically successful ultrasound guided injection.   Procedure: Real-time Ultrasound Guided Injection of left knee joint superior lateral patellar space Device: Philips Affiniti 50G/GE Logiq Images permanently stored and available for review in PACS Verbal informed consent obtained.  Discussed risks and benefits of procedure. Warned about infection, bleeding, damage to structures among others. Patient expresses understanding and agreement Time-out conducted.   Noted no overlying erythema, induration, or other signs of local infection.   Skin prepped in a sterile fashion.   Local anesthesia: Topical Ethyl chloride.   With sterile technique and under real time ultrasound guidance: Orthovisc 30 mg injected into knee joint. Fluid seen entering the joint capsule.   Completed without difficulty   Advised to call if fevers/chills, erythema, induration, drainage, or persistent bleeding.   Images  permanently stored and available for review in the ultrasound unit.  Impression: Technically successful ultrasound guided injection.  Lot number: [id number]

## 2024-06-09 ENCOUNTER — Other Ambulatory Visit: Payer: Self-pay | Admitting: Internal Medicine

## 2024-06-10 NOTE — Telephone Encounter (Signed)
LVM for pt to call and schedule OV

## 2024-06-10 NOTE — Progress Notes (Unsigned)
 CARDIOLOGY CONSULT NOTE       Patient ID: Jamie [name] MRN: [id number] DOB/AGE: 1945/08/28 78 y.o.  Referring Physician: Rollene Primary Physician: Rollene Almarie LABOR, MD Primary Cardiologist: New Reason for Consultation: Chest pain   HPI:  78 y.o. referred by Dr Rollene for chest pain. History of Asthma, Anxiety, GERD, partial nephrectomy for RCC. She has been getting Orthovisc injections in left knee  She had pain between her shoulder blades January 2025 that turne out to be gallstones and had surgery. She has GERD, IBS and history of polyps with diverticulitis that she sees Dr abran for   She was seen for atypical chest pain in 2017 and had normal echo with bubble study and normal lexiscan  myovue with EF 55-60%   I see her husband Avri Paiva for years for PVC;s and HTN  She has a myriad of atypical chest pain types. Feels like a knife between the shoulder blades when in kitchen standing too long Has some pain that radiates to her right arm at rest. She does not think her pains are related to GERD/GI issues. She has some exertional dyspnea which is more likely from deconditioning and weight ECG in office with chest pain had normal ST segments   ROS All other systems reviewed and negative except as noted above  Past Medical History:  Diagnosis Date   Allergic rhinitis    Anxiety    Arthritis    left foot   Asthma    Back pain    Blood transfusion without reported diagnosis    Colon polyps    adenomatous   Constipation    Diverticulosis    Dysrhythmia 1999   Dr Delford 2003   Elevated LFTs    Fatty liver disease, nonalcoholic    GERD (gastroesophageal reflux disease)    Headache(784.0)    migraines with vision changes but no pain   Heart murmur    Hypertension    IBS (irritable bowel syndrome)    Joint pain    Migraines    Muscle cramps    Neuromuscular disorder (HCC)    RLS   Obesity    Palpitations    Pneumonia 2007   Renal cell carcinoma (HCC)  2013   minimal partial nephrectomy   Restless leg syndrome    Sleep apnea    moderate; wears CPAP, uncertain about setting   Stroke [facility])    TIA (transient ischemic attack) 09/11/2014   Ulcer    Urinary incontinence    Vitamin D  deficiency     Family History  Problem Relation Age of Onset   Hypertension Mother    Anxiety disorder Mother    Heart disease Father 69   Lung cancer Father        smoker   Lung cancer Sister    Heart disease Brother    Allergies Brother    Allergies Daughter    Melanoma Daughter    Colon cancer Cousin    Hypertension Other    Liver disease Neg Hx    Asthma Neg Hx    Esophageal cancer Neg Hx    Rectal cancer Neg Hx    Stomach cancer Neg Hx    Liver cancer Neg Hx    Prostate cancer Neg Hx    Pancreatic cancer Neg Hx     Social History   Socioeconomic History   Marital status: Married    Spouse name: Marty [name]   Number of children: 2   Years of education: 60  Highest education level: Associate degree: academic program  Occupational History   Occupation: Retired    Associate Professor: OTHER    Comment: real estate agent  Tobacco Use   Smoking status: Never   Smokeless tobacco: Never  Vaping Use   Vaping status: Never Used  Substance and Sexual Activity   Alcohol use: Yes    Alcohol/week: 2.0 - 3.0 standard drinks of alcohol    Types: 1 - 2 Glasses of wine, 1 Shots of liquor per week    Comment: occassionally   Drug use: No   Sexual activity: Yes    Birth control/protection: Surgical  Other Topics Concern   Not on file  Social History Narrative   Married Jamie [name]) and lives with her spouse./2025   Retired Audiological Scientist estate.   Education- college   Patient is right handed.   Patient drinks tea a lot and water .   Social Drivers of Health   Tobacco Use: Low Risk (05/13/2024)   Patient History    Smoking Tobacco Use: Never    Smokeless Tobacco Use: Never    Passive Exposure: Not on file  Financial Resource Strain: Low Risk (04/29/2024)    Overall Financial Resource Strain (CARDIA)    Difficulty of Paying Living Expenses: Not hard at all  Food Insecurity: No Food Insecurity (04/29/2024)   Epic    Worried About Programme Researcher, Broadcasting/film/video in the Last Year: Never true    Ran Out of Food in the Last Year: Never true  Transportation Needs: No Transportation Needs (04/29/2024)   Epic    Lack of Transportation (Medical): No    Lack of Transportation (Non-Medical): No  Physical Activity: Unknown (04/29/2024)   Exercise Vital Sign    Days of Exercise per Week: Patient declined    Minutes of Exercise per Session: Not on file  Stress: Stress Concern Present (04/29/2024)   [employer] of Occupational Health - Occupational Stress Questionnaire    Feeling of Stress: To some extent  Social Connections: Moderately Integrated (04/29/2024)   Social Connection and Isolation Panel    Frequency of Communication with Friends and Family: More than three times a week    Frequency of Social Gatherings with Friends and Family: Once a week    Attends Religious Services: Patient declined    Active Member of Clubs or Organizations: Yes    Attends Banker Meetings: More than 4 times per year    Marital Status: Married  Catering Manager Violence: Not At Risk (04/29/2024)   Epic    Fear of Current or Ex-Partner: No    Emotionally Abused: No    Physically Abused: No    Sexually Abused: No  Depression (PHQ2-9): Low Risk (04/29/2024)   Depression (PHQ2-9)    PHQ-2 Score: 4  Alcohol Screen: Low Risk (04/29/2024)   Alcohol Screen    Last Alcohol Screening Score (AUDIT): 3  Housing: Low Risk (04/29/2024)   Epic    Unable to Pay for Housing in the Last Year: No    Number of Times Moved in the Last Year: 0    Homeless in the Last Year: No  Utilities: Not At Risk (04/24/2023)   AHC Utilities    Threatened with loss of utilities: No  Health Literacy: Adequate Health Literacy (04/24/2023)   B1300 Health Literacy    Frequency of need  for help with medical instructions: Never    Past Surgical History:  Procedure Laterality Date   BREAST REDUCTION SURGERY  Jan 1991  CHOLECYSTECTOMY N/A 08/08/2023   Procedure: LAPAROSCOPIC CHOLECYSTECTOMY;  Surgeon: Polly Cordella LABOR, MD;  Location: [facility];  Service: General;  Laterality: N/A;   ELBOW SURGERY Left    KIDNEY SURGERY Right October 2013    Tumor removed    LAPAROSCOPIC APPENDECTOMY N/A 06/08/2018   Procedure: APPENDECTOMY LAPAROSCOPIC;  Surgeon: Curvin Mt III, MD;  Location: [facility];  Service: General;  Laterality: N/A;   TONSILLECTOMY     VAGINAL HYSTERECTOMY  1979   partial      Current Outpatient Medications:    aspirin  EC 81 MG tablet, Take 1 tablet (81 mg total) by mouth daily., Disp: 90 tablet, Rfl: 3   clidinium-chlordiazePOXIDE  (LIBRAX ) 5-2.5 MG capsule, Take 1 capsule by mouth in the morning and at bedtime., Disp: 180 capsule, Rfl: 0   clonazePAM  (KLONOPIN ) 0.5 MG tablet, Take 1 tablet (0.5 mg total) by mouth at bedtime., Disp: 45 tablet, Rfl: 2   Collagen-Vitamin C-Biotin (COLLAGEN PO), Take 1 tablet by mouth daily., Disp: , Rfl:    esomeprazole  (NEXIUM ) 40 MG capsule, TAKE 1 CAPSULE TWICE A DAY BEFORE MEALS, Disp: 180 capsule, Rfl: 3   famotidine  (PEPCID ) 20 MG tablet, Take 1 tablet (20 mg total) by mouth 2 (two) times daily., Disp: 180 tablet, Rfl: 3   linaclotide  (LINZESS ) 290 MCG CAPS capsule, TAKE 1 CAPSULE DAILY BEFORE BREAKFAST, Disp: 90 capsule, Rfl: 3   metaxalone  (SKELAXIN ) 400 MG tablet, TAKE 1 TABLET AT BEDTIME, Disp: 30 tablet, Rfl: 0   metFORMIN  (GLUCOPHAGE ) 500 MG tablet, TAKE 1 TABLET DAILY WITH BREAKFAST, Disp: 90 tablet, Rfl: 0   METRONIDAZOLE , TOPICAL, 0.75 % LOTN, Apply 1 Application topically in the morning and at bedtime., Disp: , Rfl:    Multiple Vitamins-Minerals (CENTRUM SILVER 50+WOMEN) TABS, Take 1 tablet by mouth daily., Disp: , Rfl:    Omega-3-6-9 CAPS, Take 1 capsule by mouth every evening., Disp: , Rfl:    Probiotic Product  (PROBIOTIC-10 PO), Take 1 capsule by mouth 2 (two) times daily. , Disp: , Rfl:    Rimegepant Sulfate (NURTEC) 75 MG TBDP, Take 75 mg by mouth daily as needed (take for abortive therapy of migraine, no more than 1 tablet in 24 hours or 8 per month)., Disp: 8 tablet, Rfl: 11   RSV vaccine recomb adjuvanted (AREXVY ) 120 MCG/0.5ML injection, Inject into the muscle., Disp: 1 each, Rfl: 0   spironolactone  (ALDACTONE ) 50 MG tablet, Take 1 tablet (50 mg total) by mouth daily., Disp: 90 tablet, Rfl: 3    Physical Exam: Blood pressure 128/66, pulse 75, height 5' 2 (1.575 m), weight 208 lb (94.3 kg), SpO2 95%.   Affect appropriate Healthy:  appears stated age HEENT: normal Neck supple with no adenopathy JVP normal no bruits no thyromegaly Lungs clear with no wheezing and good diaphragmatic motion Heart:  S1/S2 no murmur, no rub, gallop or click PMI normal Abdomen: benighn, BS positve, no tenderness, no AAA no bruit.  No HSM or HJR prior partial nephrectomy Distal pulses intact with no bruits No edema Neuro non-focal Skin warm and dry No muscular weakness   Labs:   Lab Results  Component Value Date   WBC 4.9 08/05/2023   HGB 12.9 08/05/2023   HCT 40.2 08/05/2023   MCV 97.6 08/05/2023   PLT 194 08/05/2023   No results for input(s): NA, K, CL, CO2, BUN, CREATININE, CALCIUM, PROT, BILITOT, ALKPHOS, ALT, AST, GLUCOSE in the last 168 hours.  Invalid input(s): LABALBU No results found for: CKTOTAL, CKMB, CKMBINDEX, TROPONINI  Lab Results  Component Value Date   CHOL 186 05/05/2023   CHOL 187 06/25/2022   CHOL 176 08/25/2017   Lab Results  Component Value Date   HDL 74.80 05/05/2023   HDL 70.20 06/25/2022   HDL 84 08/25/2017   Lab Results  Component Value Date   LDLCALC 85 05/05/2023   LDLCALC 98 06/25/2022   LDLCALC 74 08/25/2017   Lab Results  Component Value Date   TRIG 132.0 05/05/2023   TRIG 95.0 06/25/2022   TRIG 91 08/25/2017    Lab Results  Component Value Date   CHOLHDL 2 05/05/2023   CHOLHDL 3 06/25/2022   CHOLHDL 3 08/24/2010   No results found for: LDLDIRECT    Radiology: No results found.  EKG: SR rate 75 isolated Q wave in e,F voltage for LVH poor R wave progression    ASSESSMENT AND PLAN:   Chest Pain:  complicated by co morbid GI issues including GERD, and symptoms related to gallstones Will call in SL nitroglycerin. Shared decision making favor PET/CT to further risk stratify DM:  on glucophage  f/u with primary for A1c  GI:  GERD on pepcid  IBS on Linzess  f/u Dr Abran  PET/CT   F/U PRN Signed: Maude Emmer 06/18/2024, 2:35 PM

## 2024-06-10 NOTE — Telephone Encounter (Signed)
 Needs apt none within 1 year then can have 30 day fill once scheduled.

## 2024-06-11 ENCOUNTER — Other Ambulatory Visit: Payer: Self-pay | Admitting: Internal Medicine

## 2024-06-11 DIAGNOSIS — E88819 Insulin resistance, unspecified: Secondary | ICD-10-CM

## 2024-06-15 DIAGNOSIS — D3132 Benign neoplasm of left choroid: Secondary | ICD-10-CM | POA: Diagnosis not present

## 2024-06-15 DIAGNOSIS — H25013 Cortical age-related cataract, bilateral: Secondary | ICD-10-CM | POA: Diagnosis not present

## 2024-06-15 DIAGNOSIS — H25043 Posterior subcapsular polar age-related cataract, bilateral: Secondary | ICD-10-CM | POA: Diagnosis not present

## 2024-06-18 ENCOUNTER — Ambulatory Visit: Attending: Cardiovascular Disease | Admitting: Cardiovascular Disease

## 2024-06-18 VITALS — BP 128/66 | HR 75 | Ht 62.0 in | Wt 208.0 lb

## 2024-06-18 DIAGNOSIS — R072 Precordial pain: Secondary | ICD-10-CM

## 2024-06-18 DIAGNOSIS — I10 Essential (primary) hypertension: Secondary | ICD-10-CM | POA: Diagnosis not present

## 2024-06-18 DIAGNOSIS — K219 Gastro-esophageal reflux disease without esophagitis: Secondary | ICD-10-CM | POA: Diagnosis not present

## 2024-06-18 DIAGNOSIS — R7303 Prediabetes: Secondary | ICD-10-CM

## 2024-06-18 DIAGNOSIS — R079 Chest pain, unspecified: Secondary | ICD-10-CM | POA: Diagnosis not present

## 2024-06-18 DIAGNOSIS — K588 Other irritable bowel syndrome: Secondary | ICD-10-CM | POA: Diagnosis not present

## 2024-06-18 MED ORDER — NITROGLYCERIN 0.4 MG SL SUBL: 0.4000 mg | SUBLINGUAL_TABLET | SUBLINGUAL | 3 refills | Status: DC | PRN

## 2024-06-18 MED ORDER — NITROGLYCERIN 0.4 MG SL SUBL: 0.4000 mg | SUBLINGUAL_TABLET | SUBLINGUAL | 0 refills | Status: DC | PRN

## 2024-06-18 NOTE — Patient Instructions (Addendum)
 Medication Instructions:  Start Nitroglycerin 0.4 mg place one tablet under tongue as needed for chest pain *If you need a refill on your cardiac medications before your next appointment, please call your pharmacy*  Testing/Procedures:    Please report to Radiology at the [facility] Main Entrance 30 minutes early for your test.  [address] [place], [place] [address]                      How to Prepare for Your Cardiac PET/CT Stress Test:  Nothing to eat or drink, except water , 3 hours prior to arrival time.  NO caffeine /decaffeinated products, or chocolate 12 hours prior to arrival. (Please note decaffeinated beverages (teas/coffees) still contain caffeine ).  If you have caffeine  within 12 hours prior, the test will need to be rescheduled.  Medication instructions: Do not take erectile dysfunction medications for 72 hours prior to test (sildenafil, tadalafil) Do not take nitrates (isosorbide mononitrate, Ranexa) the day before or day of test Do not take tamsulosin the day before or morning of test Hold theophylline containing medications for 12 hours. Hold Dipyridamole 48 hours prior to the test.  Diabetic Preparation: If able to eat breakfast prior to 3 hour fasting, you may take all medications, including your insulin . Do not worry if you miss your breakfast dose of insulin  - start at your next meal. If you do not eat prior to 3 hour fast-Hold all diabetes (oral and insulin ) medications. Patients who wear a continuous glucose monitor MUST remove the device prior to scanning.  You may take your remaining medications with water .  NO perfume, cologne or lotion on chest or abdomen area. FEMALES - Please avoid wearing dresses to this appointment.  Total time is 1 to 2 hours; you may want to bring reading material for the waiting time.  IF YOU THINK YOU MAY BE PREGNANT, OR ARE NURSING PLEASE INFORM THE TECHNOLOGIST.  In preparation for your appointment, medication  and supplies will be purchased.  Appointment availability is limited, so if you need to cancel or reschedule, please call the Radiology Department Scheduler at [phone] 24 hours in advance to avoid a cancellation fee of $100.00  What to Expect When you Arrive:  Once you arrive and check in for your appointment, you will be taken to a preparation room within the Radiology Department.  A technologist or Nurse will obtain your medical history, verify that you are correctly prepped for the exam, and explain the procedure.  Afterwards, an IV will be started in your arm and electrodes will be placed on your skin for EKG monitoring during the stress portion of the exam. Then you will be escorted to the PET/CT scanner.  There, staff will get you positioned on the scanner and obtain a blood pressure and EKG.  During the exam, you will continue to be connected to the EKG and blood pressure machines.  A small, safe amount of a radioactive tracer will be injected in your IV to obtain a series of pictures of your heart along with an injection of a stress agent.    After your Exam:  It is recommended that you eat a meal and drink a caffeinated beverage to counter act any effects of the stress agent.  Drink plenty of fluids for the remainder of the day and urinate frequently for the first couple of hours after the exam.  Your doctor will inform you of your test results within 7-10 business days.  For  more information and frequently asked questions, please visit our website: [email or web address]  For questions about your test or how to prepare for your test, please call: Cardiac Imaging Nurse Navigators Office: [phone]  Follow-Up: As needed

## 2024-06-25 ENCOUNTER — Ambulatory Visit: Admitting: Internal Medicine

## 2024-06-25 ENCOUNTER — Encounter: Payer: Self-pay | Admitting: Internal Medicine

## 2024-06-25 VITALS — BP 120/70 | HR 70 | Temp 98.1°F | Ht 62.0 in | Wt 208.2 lb

## 2024-06-25 DIAGNOSIS — M7918 Myalgia, other site: Secondary | ICD-10-CM | POA: Diagnosis not present

## 2024-06-25 DIAGNOSIS — E88819 Insulin resistance, unspecified: Secondary | ICD-10-CM

## 2024-06-25 DIAGNOSIS — K760 Fatty (change of) liver, not elsewhere classified: Secondary | ICD-10-CM | POA: Diagnosis not present

## 2024-06-25 DIAGNOSIS — R7303 Prediabetes: Secondary | ICD-10-CM | POA: Diagnosis not present

## 2024-06-25 DIAGNOSIS — G8929 Other chronic pain: Secondary | ICD-10-CM

## 2024-06-25 DIAGNOSIS — G4733 Obstructive sleep apnea (adult) (pediatric): Secondary | ICD-10-CM | POA: Diagnosis not present

## 2024-06-25 DIAGNOSIS — I10 Essential (primary) hypertension: Secondary | ICD-10-CM

## 2024-06-25 LAB — COMPREHENSIVE METABOLIC PANEL WITH GFR
ALT: 63 U/L — ABNORMAL HIGH (ref 3–35)
AST: 39 U/L — ABNORMAL HIGH (ref 5–37)
Albumin: 4.4 g/dL (ref 3.5–5.2)
Alkaline Phosphatase: 70 U/L (ref 39–117)
BUN: 15 mg/dL (ref 6–23)
CO2: 26 meq/L (ref 19–32)
Calcium: 9.9 mg/dL (ref 8.4–10.5)
Chloride: 105 meq/L (ref 96–112)
Creatinine, Ser: 0.83 mg/dL (ref 0.40–1.20)
GFR: 68.45 mL/min
Glucose, Bld: 115 mg/dL — ABNORMAL HIGH (ref 70–99)
Potassium: 4.8 meq/L (ref 3.5–5.1)
Sodium: 138 meq/L (ref 135–145)
Total Bilirubin: 0.4 mg/dL (ref 0.2–1.2)
Total Protein: 6.9 g/dL (ref 6.0–8.3)

## 2024-06-25 LAB — CBC
HCT: 39.7 % (ref 36.0–46.0)
Hemoglobin: 13.5 g/dL (ref 12.0–15.0)
MCHC: 34 g/dL (ref 30.0–36.0)
MCV: 94 fl (ref 78.0–100.0)
Platelets: 202 K/uL (ref 150.0–400.0)
RBC: 4.22 Mil/uL (ref 3.87–5.11)
RDW: 13 % (ref 11.5–15.5)
WBC: 4.6 K/uL (ref 4.0–10.5)

## 2024-06-25 LAB — LIPID PANEL
Cholesterol: 173 mg/dL (ref 28–200)
HDL: 72.8 mg/dL
LDL Cholesterol: 80 mg/dL (ref 10–99)
NonHDL: 99.84
Total CHOL/HDL Ratio: 2
Triglycerides: 98 mg/dL (ref 10.0–149.0)
VLDL: 19.6 mg/dL (ref 0.0–40.0)

## 2024-06-25 LAB — HEMOGLOBIN A1C: Hgb A1c MFr Bld: 5.5 % (ref 4.6–6.5)

## 2024-06-25 MED ORDER — METAXALONE 400 MG PO TABS: 400.0000 mg | ORAL_TABLET | Freq: Every day | ORAL | 3 refills | Status: DC

## 2024-06-25 MED ORDER — SPIRONOLACTONE 50 MG PO TABS: 50.0000 mg | ORAL_TABLET | Freq: Every day | ORAL | 3 refills | Status: AC

## 2024-06-25 MED ORDER — METFORMIN HCL 500 MG PO TABS: 500.0000 mg | ORAL_TABLET | Freq: Every day | ORAL | 3 refills | Status: AC

## 2024-06-25 NOTE — Progress Notes (Signed)
 "  Subjective:   Patient ID: Jamie [name], female    DOB: 12-07-1945, 78 y.o.   MRN: [id number]  Discussed the use of AI scribe software for clinical note transcription with the patient, who gave verbal consent to proceed.  History of Present Illness Jamie [name] is a 78 year old female who presents with chronic back and chest pain.  She underwent gallbladder removal on January 31st, 2025, which resulted in unexpected postoperative pain. A subsequent scan was performed, and she was treated with a 10-day prescription for possible colitis. She experienced significant discomfort due to a long incision that caused pain when moving, which was alleviated by wearing a body binder for several weeks.  She describes chronic pain in her back, particularly across her shoulder blades, which feels like 'a knife sticking between my shoulder blades.' This pain is exacerbated by activities such as cooking or completing projects, leading to muscle spasms and a sensation of lying 'on a bed of rocks' at night. She attributes some of this pain to stress related to her husband's health issues.  She occasionally experiences chest tightness, described as a 'real tight feeling' that occurs infrequently. This tightness is accompanied by stiffness in her back and other muscles.  No new gastrointestinal symptoms such as diarrhea, constipation, or blood in her stool. She also reports no new skin changes or swelling in her feet, except for mild swelling after long flights.  Her husband, Marty, has been undergoing treatment for Parkinson's disease and had knee replacement surgery, which has been complicated by slow healing and a subsequent fall requiring additional surgery. She reports that she cannot leave the house because someone needs to be present with her husband.  Review of Systems  Constitutional:  Positive for activity change.  HENT: Negative.    Eyes: Negative.   Respiratory:  Negative for cough,  chest tightness and shortness of breath.   Cardiovascular:  Negative for chest pain, palpitations and leg swelling.  Gastrointestinal:  Negative for abdominal distention, abdominal pain, constipation, diarrhea, nausea and vomiting.  Musculoskeletal:  Positive for arthralgias and myalgias.  Skin: Negative.   Neurological: Negative.   Psychiatric/Behavioral: Negative.      Objective:  Physical Exam Constitutional:      Appearance: She is well-developed. She is obese.  HENT:     Head: Normocephalic and atraumatic.  Cardiovascular:     Rate and Rhythm: Normal rate and regular rhythm.  Pulmonary:     Effort: Pulmonary effort is normal. No respiratory distress.     Breath sounds: Normal breath sounds. No wheezing or rales.  Abdominal:     General: Bowel sounds are normal. There is no distension.     Palpations: Abdomen is soft.     Tenderness: There is no abdominal tenderness.  Musculoskeletal:     Cervical back: Normal range of motion.  Skin:    General: Skin is warm and dry.  Neurological:     Mental Status: She is alert and oriented to person, place, and time.     Coordination: Coordination normal.     Vitals:   06/25/24 1140  BP: 120/70  Pulse: 70  Temp: 98.1 F (36.7 C)  TempSrc: Oral  SpO2: 95%  Weight: 208 lb 3.2 oz (94.4 kg)  Height: 5' 2 (1.575 m)    Assessment and Plan Assessment & Plan Chronic myofascial pain syndrome   She experiences persistent back and shoulder blade pain, worsened by stress and caregiving, affecting daily activities and quality  of life. Refill skelaxin   Morbid obesity BMI 38 and complicated by hypertension. Counseled about diet and exercise.   Essential hypertension Checking labs and BP at goal adjust regimen as needed  OSA on CPAP Continue CPAP regularly.   Pre-diabetes Checking HgA1c  Fatty liver disease Checking CMP and adjust as needed.    "

## 2024-06-25 NOTE — Assessment & Plan Note (Signed)
 Continue CPAP regularly.

## 2024-06-25 NOTE — Patient Instructions (Signed)
 We will check the labs today.

## 2024-07-01 ENCOUNTER — Other Ambulatory Visit: Payer: Self-pay | Admitting: Cardiovascular Disease

## 2024-07-05 ENCOUNTER — Ambulatory Visit: Payer: Self-pay | Admitting: Internal Medicine

## 2024-07-24 ENCOUNTER — Other Ambulatory Visit: Payer: Self-pay | Admitting: Gastroenterology

## 2024-07-24 DIAGNOSIS — K5732 Diverticulitis of large intestine without perforation or abscess without bleeding: Secondary | ICD-10-CM

## 2024-07-24 MED ORDER — AMOXICILLIN-POT CLAVULANATE 875-125 MG PO TABS: 1.0000 | ORAL_TABLET | Freq: Two times a day (BID) | ORAL | 0 refills | Status: AC

## 2024-07-24 NOTE — Progress Notes (Signed)
 Pt called  Have typical pain for diverticulitis- LLQ, no fever Wants antibiotics- very adamant that she cannot wait until Monday   Had taken Augmentin  875 BID x 10 days 2 yrs ago   Will call in augmentin  BID x 10 days. If not better, needs to call us .

## 2024-07-30 ENCOUNTER — Encounter: Payer: Self-pay | Admitting: Neurology

## 2024-07-30 ENCOUNTER — Other Ambulatory Visit: Payer: Self-pay | Admitting: Neurology

## 2024-08-02 ENCOUNTER — Other Ambulatory Visit: Payer: Self-pay | Admitting: *Deleted

## 2024-08-02 NOTE — Telephone Encounter (Signed)
 Last refilled by patient on : 06/20/24 Last office visit : 10/29/23 Next office visit : 12/06/24 Per last office note - Klonopin  0.5 mg for the next 30 days at night with times 3 refills- . Total of 90 days.

## 2024-08-04 ENCOUNTER — Other Ambulatory Visit: Payer: Self-pay | Admitting: Internal Medicine

## 2024-08-04 ENCOUNTER — Encounter: Payer: Self-pay | Admitting: Internal Medicine

## 2024-08-25 ENCOUNTER — Ambulatory Visit: Admitting: Family Medicine

## 2024-12-06 ENCOUNTER — Ambulatory Visit: Admitting: Family Medicine
